# Patient Record
Sex: Female | Born: 1945 | Race: Black or African American | Hispanic: No | Marital: Married | Smoking: Current some day smoker
Health system: Southern US, Community
[De-identification: ages and names within clinical notes are randomized; demographics above are authoritative.]

## PROBLEM LIST (undated history)

## (undated) DIAGNOSIS — Z720 Tobacco use: Secondary | ICD-10-CM

## (undated) DIAGNOSIS — M546 Pain in thoracic spine: Secondary | ICD-10-CM

## (undated) DIAGNOSIS — F0390 Unspecified dementia without behavioral disturbance: Secondary | ICD-10-CM

## (undated) DIAGNOSIS — I5022 Chronic systolic (congestive) heart failure: Secondary | ICD-10-CM

## (undated) DIAGNOSIS — K219 Gastro-esophageal reflux disease without esophagitis: Secondary | ICD-10-CM

## (undated) DIAGNOSIS — K5731 Diverticulosis of large intestine without perforation or abscess with bleeding: Secondary | ICD-10-CM

## (undated) DIAGNOSIS — E785 Hyperlipidemia, unspecified: Secondary | ICD-10-CM

## (undated) DIAGNOSIS — R634 Abnormal weight loss: Secondary | ICD-10-CM

## (undated) DIAGNOSIS — R0602 Shortness of breath: Secondary | ICD-10-CM

## (undated) DIAGNOSIS — I10 Essential (primary) hypertension: Secondary | ICD-10-CM

## (undated) DIAGNOSIS — I2510 Atherosclerotic heart disease of native coronary artery without angina pectoris: Secondary | ICD-10-CM

## (undated) DIAGNOSIS — R609 Edema, unspecified: Secondary | ICD-10-CM

## (undated) DIAGNOSIS — K2970 Gastritis, unspecified, without bleeding: Secondary | ICD-10-CM

## (undated) DIAGNOSIS — G8929 Other chronic pain: Secondary | ICD-10-CM

## (undated) DIAGNOSIS — J4530 Mild persistent asthma, uncomplicated: Secondary | ICD-10-CM

## (undated) DIAGNOSIS — E871 Hypo-osmolality and hyponatremia: Secondary | ICD-10-CM

## (undated) DIAGNOSIS — Z95810 Presence of automatic (implantable) cardiac defibrillator: Secondary | ICD-10-CM

## (undated) DIAGNOSIS — J449 Chronic obstructive pulmonary disease, unspecified: Secondary | ICD-10-CM

## (undated) DIAGNOSIS — E669 Obesity, unspecified: Secondary | ICD-10-CM

## (undated) DIAGNOSIS — J189 Pneumonia, unspecified organism: Secondary | ICD-10-CM

## (undated) DIAGNOSIS — M549 Dorsalgia, unspecified: Secondary | ICD-10-CM

## (undated) DIAGNOSIS — M1990 Unspecified osteoarthritis, unspecified site: Secondary | ICD-10-CM

## (undated) DIAGNOSIS — I255 Ischemic cardiomyopathy: Secondary | ICD-10-CM

## (undated) DIAGNOSIS — F419 Anxiety disorder, unspecified: Secondary | ICD-10-CM

## (undated) DIAGNOSIS — D509 Iron deficiency anemia, unspecified: Secondary | ICD-10-CM

## (undated) DIAGNOSIS — G4733 Obstructive sleep apnea (adult) (pediatric): Secondary | ICD-10-CM

## (undated) DIAGNOSIS — R55 Syncope and collapse: Secondary | ICD-10-CM

## (undated) DIAGNOSIS — IMO0002 Reserved for concepts with insufficient information to code with codable children: Secondary | ICD-10-CM

## (undated) DIAGNOSIS — E119 Type 2 diabetes mellitus without complications: Secondary | ICD-10-CM

## (undated) DIAGNOSIS — I219 Acute myocardial infarction, unspecified: Secondary | ICD-10-CM

## (undated) HISTORY — DX: Acute myocardial infarction, unspecified: I21.9

## (undated) HISTORY — DX: Gastro-esophageal reflux disease without esophagitis: K21.9

## (undated) HISTORY — DX: Ischemic cardiomyopathy: I25.5

## (undated) HISTORY — DX: Essential (primary) hypertension: I10

## (undated) HISTORY — DX: Obesity, unspecified: E66.9

## (undated) HISTORY — PX: OTHER SURGICAL HISTORY: SHX169

## (undated) HISTORY — PX: CARPAL TUNNEL RELEASE: SHX101

## (undated) HISTORY — PX: TOTAL ABDOMINAL HYSTERECTOMY: SHX209

## (undated) HISTORY — DX: Unspecified osteoarthritis, unspecified site: M19.90

## (undated) HISTORY — DX: Diverticulosis of large intestine without perforation or abscess with bleeding: K57.31

## (undated) HISTORY — PX: TONSILLECTOMY: SUR1361

## (undated) HISTORY — DX: Atherosclerotic heart disease of native coronary artery without angina pectoris: I25.10

## (undated) HISTORY — DX: Reserved for concepts with insufficient information to code with codable children: IMO0002

## (undated) HISTORY — DX: Hyperlipidemia, unspecified: E78.5

## (undated) HISTORY — DX: Mild persistent asthma, uncomplicated: J45.30

## (undated) HISTORY — PX: TOTAL ABDOMINAL HYSTERECTOMY W/ BILATERAL SALPINGOOPHORECTOMY: SHX83

## (undated) HISTORY — PX: APPENDECTOMY: SHX54

## (undated) HISTORY — PX: REFRACTIVE SURGERY: SHX103

## (undated) HISTORY — DX: Obstructive sleep apnea (adult) (pediatric): G47.33

## (undated) HISTORY — DX: Anxiety disorder, unspecified: F41.9

---

## 1997-07-01 ENCOUNTER — Encounter: Admission: RE | Admit: 1997-07-01 | Discharge: 1997-07-01 | Payer: Self-pay | Admitting: Internal Medicine

## 1997-07-31 ENCOUNTER — Encounter: Admission: RE | Admit: 1997-07-31 | Discharge: 1997-07-31 | Payer: Self-pay | Admitting: Internal Medicine

## 1997-08-05 ENCOUNTER — Encounter: Admission: RE | Admit: 1997-08-05 | Discharge: 1997-08-05 | Payer: Self-pay | Admitting: Internal Medicine

## 1997-10-01 ENCOUNTER — Encounter: Admission: RE | Admit: 1997-10-01 | Discharge: 1997-10-01 | Payer: Self-pay | Admitting: Internal Medicine

## 1997-11-01 ENCOUNTER — Emergency Department (HOSPITAL_COMMUNITY): Admission: EM | Admit: 1997-11-01 | Discharge: 1997-11-01 | Payer: Self-pay | Admitting: *Deleted

## 1997-11-02 ENCOUNTER — Encounter: Admission: RE | Admit: 1997-11-02 | Discharge: 1997-11-02 | Payer: Self-pay | Admitting: Internal Medicine

## 1997-11-03 ENCOUNTER — Encounter: Payer: Self-pay | Admitting: Hematology and Oncology

## 1997-11-03 ENCOUNTER — Inpatient Hospital Stay (HOSPITAL_COMMUNITY): Admission: RE | Admit: 1997-11-03 | Discharge: 1997-11-05 | Payer: Self-pay | Admitting: Hematology and Oncology

## 1997-11-03 ENCOUNTER — Encounter: Admission: RE | Admit: 1997-11-03 | Discharge: 1997-11-03 | Payer: Self-pay | Admitting: Hematology and Oncology

## 1997-11-19 ENCOUNTER — Encounter: Admission: RE | Admit: 1997-11-19 | Discharge: 1997-11-19 | Payer: Self-pay | Admitting: Internal Medicine

## 1997-12-02 ENCOUNTER — Ambulatory Visit: Admission: RE | Admit: 1997-12-02 | Discharge: 1997-12-02 | Payer: Self-pay | Admitting: Internal Medicine

## 1997-12-16 ENCOUNTER — Encounter: Admission: RE | Admit: 1997-12-16 | Discharge: 1997-12-16 | Payer: Self-pay | Admitting: Internal Medicine

## 1997-12-30 ENCOUNTER — Ambulatory Visit (HOSPITAL_COMMUNITY): Admission: RE | Admit: 1997-12-30 | Discharge: 1997-12-30 | Payer: Self-pay

## 1998-01-08 ENCOUNTER — Encounter: Admission: RE | Admit: 1998-01-08 | Discharge: 1998-01-08 | Payer: Self-pay | Admitting: Internal Medicine

## 1998-01-15 ENCOUNTER — Encounter: Admission: RE | Admit: 1998-01-15 | Discharge: 1998-01-15 | Payer: Self-pay | Admitting: Internal Medicine

## 1998-03-16 ENCOUNTER — Encounter: Admission: RE | Admit: 1998-03-16 | Discharge: 1998-03-16 | Payer: Self-pay | Admitting: Internal Medicine

## 1998-06-15 ENCOUNTER — Encounter: Admission: RE | Admit: 1998-06-15 | Discharge: 1998-06-15 | Payer: Self-pay | Admitting: Internal Medicine

## 1998-07-14 ENCOUNTER — Encounter: Admission: RE | Admit: 1998-07-14 | Discharge: 1998-07-14 | Payer: Self-pay | Admitting: Internal Medicine

## 1998-07-19 ENCOUNTER — Encounter: Payer: Self-pay | Admitting: *Deleted

## 1998-07-19 ENCOUNTER — Emergency Department (HOSPITAL_COMMUNITY): Admission: EM | Admit: 1998-07-19 | Discharge: 1998-07-19 | Payer: Self-pay | Admitting: *Deleted

## 1998-07-29 ENCOUNTER — Encounter: Admission: RE | Admit: 1998-07-29 | Discharge: 1998-07-29 | Payer: Self-pay | Admitting: Internal Medicine

## 1998-09-09 ENCOUNTER — Ambulatory Visit (HOSPITAL_COMMUNITY): Admission: RE | Admit: 1998-09-09 | Discharge: 1998-09-09 | Payer: Self-pay | Admitting: Gastroenterology

## 1998-09-09 ENCOUNTER — Encounter (INDEPENDENT_AMBULATORY_CARE_PROVIDER_SITE_OTHER): Payer: Self-pay | Admitting: Specialist

## 1998-09-14 ENCOUNTER — Encounter: Admission: RE | Admit: 1998-09-14 | Discharge: 1998-09-14 | Payer: Self-pay | Admitting: Internal Medicine

## 1998-09-28 ENCOUNTER — Ambulatory Visit (HOSPITAL_COMMUNITY): Admission: RE | Admit: 1998-09-28 | Discharge: 1998-09-28 | Payer: Self-pay | Admitting: Internal Medicine

## 1998-09-28 ENCOUNTER — Encounter: Payer: Self-pay | Admitting: Internal Medicine

## 1998-11-03 ENCOUNTER — Encounter: Admission: RE | Admit: 1998-11-03 | Discharge: 1998-11-03 | Payer: Self-pay | Admitting: Internal Medicine

## 1999-01-19 ENCOUNTER — Ambulatory Visit: Admission: RE | Admit: 1999-01-19 | Discharge: 1999-01-19 | Payer: Self-pay | Admitting: Internal Medicine

## 1999-01-25 ENCOUNTER — Encounter: Admission: RE | Admit: 1999-01-25 | Discharge: 1999-01-25 | Payer: Self-pay | Admitting: Hematology and Oncology

## 1999-02-09 ENCOUNTER — Encounter: Admission: RE | Admit: 1999-02-09 | Discharge: 1999-02-09 | Payer: Self-pay | Admitting: Internal Medicine

## 1999-03-08 ENCOUNTER — Ambulatory Visit (HOSPITAL_BASED_OUTPATIENT_CLINIC_OR_DEPARTMENT_OTHER): Admission: RE | Admit: 1999-03-08 | Discharge: 1999-03-09 | Payer: Self-pay | Admitting: Otolaryngology

## 1999-03-08 ENCOUNTER — Encounter (INDEPENDENT_AMBULATORY_CARE_PROVIDER_SITE_OTHER): Payer: Self-pay | Admitting: Specialist

## 1999-04-27 ENCOUNTER — Encounter: Admission: RE | Admit: 1999-04-27 | Discharge: 1999-04-27 | Payer: Self-pay | Admitting: Internal Medicine

## 1999-05-03 ENCOUNTER — Encounter: Admission: RE | Admit: 1999-05-03 | Discharge: 1999-05-03 | Payer: Self-pay | Admitting: Internal Medicine

## 1999-08-03 ENCOUNTER — Encounter: Admission: RE | Admit: 1999-08-03 | Discharge: 1999-08-03 | Payer: Self-pay | Admitting: Internal Medicine

## 1999-09-29 ENCOUNTER — Ambulatory Visit (HOSPITAL_COMMUNITY): Admission: RE | Admit: 1999-09-29 | Discharge: 1999-09-29 | Payer: Self-pay | Admitting: Internal Medicine

## 1999-11-01 ENCOUNTER — Encounter: Admission: RE | Admit: 1999-11-01 | Discharge: 1999-11-01 | Payer: Self-pay | Admitting: Internal Medicine

## 1999-11-08 ENCOUNTER — Encounter: Admission: RE | Admit: 1999-11-08 | Discharge: 1999-11-08 | Payer: Self-pay | Admitting: Hematology and Oncology

## 1999-12-16 ENCOUNTER — Encounter: Payer: Self-pay | Admitting: Gastroenterology

## 1999-12-16 ENCOUNTER — Ambulatory Visit (HOSPITAL_COMMUNITY): Admission: RE | Admit: 1999-12-16 | Discharge: 1999-12-16 | Payer: Self-pay | Admitting: Gastroenterology

## 2000-01-03 DIAGNOSIS — I219 Acute myocardial infarction, unspecified: Secondary | ICD-10-CM

## 2000-01-03 HISTORY — DX: Acute myocardial infarction, unspecified: I21.9

## 2000-01-03 HISTORY — PX: CORONARY ANGIOPLASTY WITH STENT PLACEMENT: SHX49

## 2000-01-08 ENCOUNTER — Encounter: Payer: Self-pay | Admitting: Emergency Medicine

## 2000-01-08 ENCOUNTER — Emergency Department (HOSPITAL_COMMUNITY): Admission: EM | Admit: 2000-01-08 | Discharge: 2000-01-08 | Payer: Self-pay | Admitting: Emergency Medicine

## 2000-01-25 ENCOUNTER — Encounter: Admission: RE | Admit: 2000-01-25 | Discharge: 2000-01-25 | Payer: Self-pay | Admitting: Internal Medicine

## 2000-03-06 ENCOUNTER — Encounter: Admission: RE | Admit: 2000-03-06 | Discharge: 2000-03-06 | Payer: Self-pay | Admitting: Internal Medicine

## 2000-04-11 ENCOUNTER — Encounter: Admission: RE | Admit: 2000-04-11 | Discharge: 2000-04-11 | Payer: Self-pay | Admitting: Internal Medicine

## 2000-05-17 ENCOUNTER — Emergency Department (HOSPITAL_COMMUNITY): Admission: EM | Admit: 2000-05-17 | Discharge: 2000-05-17 | Payer: Self-pay | Admitting: Emergency Medicine

## 2000-05-17 ENCOUNTER — Encounter: Payer: Self-pay | Admitting: Emergency Medicine

## 2000-06-07 ENCOUNTER — Encounter: Admission: RE | Admit: 2000-06-07 | Discharge: 2000-06-07 | Payer: Self-pay | Admitting: Internal Medicine

## 2000-07-03 ENCOUNTER — Encounter: Admission: RE | Admit: 2000-07-03 | Discharge: 2000-07-03 | Payer: Self-pay | Admitting: Internal Medicine

## 2000-07-09 ENCOUNTER — Encounter: Payer: Self-pay | Admitting: Emergency Medicine

## 2000-07-09 ENCOUNTER — Inpatient Hospital Stay (HOSPITAL_COMMUNITY): Admission: EM | Admit: 2000-07-09 | Discharge: 2000-07-16 | Payer: Self-pay | Admitting: Emergency Medicine

## 2000-07-09 DIAGNOSIS — I2510 Atherosclerotic heart disease of native coronary artery without angina pectoris: Secondary | ICD-10-CM | POA: Insufficient documentation

## 2000-07-10 ENCOUNTER — Encounter: Payer: Self-pay | Admitting: Cardiology

## 2000-07-11 ENCOUNTER — Encounter: Payer: Self-pay | Admitting: Cardiology

## 2000-07-14 ENCOUNTER — Encounter: Payer: Self-pay | Admitting: Cardiology

## 2000-07-24 ENCOUNTER — Encounter: Admission: RE | Admit: 2000-07-24 | Discharge: 2000-07-24 | Payer: Self-pay | Admitting: Internal Medicine

## 2000-07-31 ENCOUNTER — Encounter (HOSPITAL_COMMUNITY): Admission: RE | Admit: 2000-07-31 | Discharge: 2000-10-29 | Payer: Self-pay | Admitting: Cardiology

## 2000-08-08 ENCOUNTER — Encounter: Admission: RE | Admit: 2000-08-08 | Discharge: 2000-11-06 | Payer: Self-pay | Admitting: Cardiology

## 2000-09-05 ENCOUNTER — Encounter: Admission: RE | Admit: 2000-09-05 | Discharge: 2000-09-05 | Payer: Self-pay | Admitting: Internal Medicine

## 2000-09-06 ENCOUNTER — Encounter: Payer: Self-pay | Admitting: Infectious Diseases

## 2000-09-06 ENCOUNTER — Inpatient Hospital Stay (HOSPITAL_COMMUNITY): Admission: EM | Admit: 2000-09-06 | Discharge: 2000-09-08 | Payer: Self-pay

## 2000-10-25 ENCOUNTER — Ambulatory Visit: Admission: RE | Admit: 2000-10-25 | Discharge: 2000-10-25 | Payer: Self-pay | Admitting: Cardiology

## 2000-10-29 ENCOUNTER — Ambulatory Visit (HOSPITAL_COMMUNITY): Admission: RE | Admit: 2000-10-29 | Discharge: 2000-10-29 | Payer: Self-pay | Admitting: Internal Medicine

## 2000-11-09 ENCOUNTER — Inpatient Hospital Stay (HOSPITAL_COMMUNITY): Admission: EM | Admit: 2000-11-09 | Discharge: 2000-11-13 | Payer: Self-pay | Admitting: Emergency Medicine

## 2000-12-05 ENCOUNTER — Encounter: Admission: RE | Admit: 2000-12-05 | Discharge: 2000-12-05 | Payer: Self-pay | Admitting: Internal Medicine

## 2001-02-27 ENCOUNTER — Encounter: Admission: RE | Admit: 2001-02-27 | Discharge: 2001-02-27 | Payer: Self-pay | Admitting: Internal Medicine

## 2001-03-17 ENCOUNTER — Inpatient Hospital Stay (HOSPITAL_COMMUNITY): Admission: EM | Admit: 2001-03-17 | Discharge: 2001-03-20 | Payer: Self-pay | Admitting: Emergency Medicine

## 2001-03-17 DIAGNOSIS — K5731 Diverticulosis of large intestine without perforation or abscess with bleeding: Secondary | ICD-10-CM

## 2001-05-14 ENCOUNTER — Ambulatory Visit (HOSPITAL_COMMUNITY): Admission: RE | Admit: 2001-05-14 | Discharge: 2001-05-14 | Payer: Self-pay | Admitting: Gastroenterology

## 2001-05-14 ENCOUNTER — Encounter: Payer: Self-pay | Admitting: Gastroenterology

## 2001-06-05 ENCOUNTER — Other Ambulatory Visit: Admission: RE | Admit: 2001-06-05 | Discharge: 2001-06-05 | Payer: Self-pay | Admitting: Obstetrics and Gynecology

## 2001-06-26 ENCOUNTER — Encounter: Admission: RE | Admit: 2001-06-26 | Discharge: 2001-06-26 | Payer: Self-pay | Admitting: Internal Medicine

## 2001-08-29 ENCOUNTER — Inpatient Hospital Stay (HOSPITAL_COMMUNITY): Admission: EM | Admit: 2001-08-29 | Discharge: 2001-08-30 | Payer: Self-pay

## 2001-09-03 ENCOUNTER — Encounter: Admission: RE | Admit: 2001-09-03 | Discharge: 2001-09-03 | Payer: Self-pay | Admitting: Internal Medicine

## 2001-09-06 ENCOUNTER — Ambulatory Visit (HOSPITAL_COMMUNITY): Admission: RE | Admit: 2001-09-06 | Discharge: 2001-09-06 | Payer: Self-pay | Admitting: Cardiology

## 2001-09-26 ENCOUNTER — Encounter: Payer: Self-pay | Admitting: Obstetrics and Gynecology

## 2001-09-26 ENCOUNTER — Ambulatory Visit (HOSPITAL_COMMUNITY): Admission: RE | Admit: 2001-09-26 | Discharge: 2001-09-26 | Payer: Self-pay | Admitting: Obstetrics and Gynecology

## 2001-10-31 ENCOUNTER — Encounter: Payer: Self-pay | Admitting: Internal Medicine

## 2001-10-31 ENCOUNTER — Ambulatory Visit (HOSPITAL_COMMUNITY): Admission: RE | Admit: 2001-10-31 | Discharge: 2001-10-31 | Payer: Self-pay | Admitting: Internal Medicine

## 2001-12-04 ENCOUNTER — Encounter: Admission: RE | Admit: 2001-12-04 | Discharge: 2001-12-04 | Payer: Self-pay | Admitting: Internal Medicine

## 2001-12-06 ENCOUNTER — Encounter: Payer: Self-pay | Admitting: Emergency Medicine

## 2001-12-06 ENCOUNTER — Emergency Department (HOSPITAL_COMMUNITY): Admission: EM | Admit: 2001-12-06 | Discharge: 2001-12-06 | Payer: Self-pay | Admitting: Emergency Medicine

## 2001-12-10 ENCOUNTER — Encounter: Admission: RE | Admit: 2001-12-10 | Discharge: 2001-12-10 | Payer: Self-pay | Admitting: Internal Medicine

## 2002-01-02 HISTORY — PX: CHOLECYSTECTOMY: SHX55

## 2002-01-08 ENCOUNTER — Encounter: Admission: RE | Admit: 2002-01-08 | Discharge: 2002-01-08 | Payer: Self-pay | Admitting: Internal Medicine

## 2002-01-22 ENCOUNTER — Encounter: Admission: RE | Admit: 2002-01-22 | Discharge: 2002-01-22 | Payer: Self-pay | Admitting: Internal Medicine

## 2002-04-01 ENCOUNTER — Encounter: Admission: RE | Admit: 2002-04-01 | Discharge: 2002-04-01 | Payer: Self-pay | Admitting: Internal Medicine

## 2002-04-17 ENCOUNTER — Emergency Department (HOSPITAL_COMMUNITY): Admission: EM | Admit: 2002-04-17 | Discharge: 2002-04-17 | Payer: Self-pay | Admitting: Emergency Medicine

## 2002-04-17 ENCOUNTER — Encounter: Payer: Self-pay | Admitting: Emergency Medicine

## 2002-06-11 ENCOUNTER — Encounter: Admission: RE | Admit: 2002-06-11 | Discharge: 2002-06-11 | Payer: Self-pay | Admitting: Internal Medicine

## 2002-08-27 ENCOUNTER — Encounter: Admission: RE | Admit: 2002-08-27 | Discharge: 2002-08-27 | Payer: Self-pay | Admitting: Internal Medicine

## 2002-09-23 ENCOUNTER — Encounter: Admission: RE | Admit: 2002-09-23 | Discharge: 2002-09-23 | Payer: Self-pay | Admitting: Internal Medicine

## 2002-11-19 ENCOUNTER — Inpatient Hospital Stay (HOSPITAL_COMMUNITY): Admission: AD | Admit: 2002-11-19 | Discharge: 2002-11-22 | Payer: Self-pay | Admitting: Internal Medicine

## 2002-11-19 ENCOUNTER — Encounter: Admission: RE | Admit: 2002-11-19 | Discharge: 2002-11-19 | Payer: Self-pay | Admitting: Internal Medicine

## 2002-12-02 ENCOUNTER — Encounter: Admission: RE | Admit: 2002-12-02 | Discharge: 2002-12-02 | Payer: Self-pay | Admitting: Internal Medicine

## 2003-03-24 ENCOUNTER — Encounter: Admission: RE | Admit: 2003-03-24 | Discharge: 2003-03-24 | Payer: Self-pay | Admitting: Internal Medicine

## 2003-04-01 ENCOUNTER — Ambulatory Visit (HOSPITAL_COMMUNITY): Admission: RE | Admit: 2003-04-01 | Discharge: 2003-04-01 | Payer: Self-pay | Admitting: Internal Medicine

## 2003-06-23 ENCOUNTER — Encounter: Admission: RE | Admit: 2003-06-23 | Discharge: 2003-06-23 | Payer: Self-pay | Admitting: Internal Medicine

## 2003-08-16 ENCOUNTER — Emergency Department (HOSPITAL_COMMUNITY): Admission: EM | Admit: 2003-08-16 | Discharge: 2003-08-16 | Payer: Self-pay | Admitting: Emergency Medicine

## 2003-10-13 ENCOUNTER — Emergency Department (HOSPITAL_COMMUNITY): Admission: EM | Admit: 2003-10-13 | Discharge: 2003-10-13 | Payer: Self-pay | Admitting: *Deleted

## 2003-10-19 ENCOUNTER — Ambulatory Visit: Payer: Self-pay

## 2003-11-12 ENCOUNTER — Ambulatory Visit: Payer: Self-pay | Admitting: Internal Medicine

## 2003-12-07 ENCOUNTER — Inpatient Hospital Stay (HOSPITAL_COMMUNITY): Admission: EM | Admit: 2003-12-07 | Discharge: 2003-12-09 | Payer: Self-pay | Admitting: Emergency Medicine

## 2003-12-07 ENCOUNTER — Ambulatory Visit: Payer: Self-pay | Admitting: Cardiology

## 2003-12-07 ENCOUNTER — Ambulatory Visit: Payer: Self-pay | Admitting: Internal Medicine

## 2004-02-01 ENCOUNTER — Ambulatory Visit: Payer: Self-pay | Admitting: Internal Medicine

## 2004-02-08 ENCOUNTER — Ambulatory Visit: Payer: Self-pay | Admitting: Internal Medicine

## 2004-02-15 ENCOUNTER — Ambulatory Visit: Payer: Self-pay | Admitting: Internal Medicine

## 2004-03-11 ENCOUNTER — Ambulatory Visit: Payer: Self-pay | Admitting: Cardiology

## 2004-04-07 ENCOUNTER — Ambulatory Visit: Payer: Self-pay

## 2004-04-25 ENCOUNTER — Ambulatory Visit: Payer: Self-pay | Admitting: Internal Medicine

## 2004-07-18 ENCOUNTER — Ambulatory Visit: Payer: Self-pay | Admitting: Internal Medicine

## 2004-09-19 ENCOUNTER — Ambulatory Visit: Payer: Self-pay | Admitting: Internal Medicine

## 2004-09-22 ENCOUNTER — Ambulatory Visit: Payer: Self-pay | Admitting: Internal Medicine

## 2004-09-29 ENCOUNTER — Ambulatory Visit: Payer: Self-pay | Admitting: Cardiology

## 2004-10-12 ENCOUNTER — Ambulatory Visit: Payer: Self-pay | Admitting: Gastroenterology

## 2004-11-27 ENCOUNTER — Inpatient Hospital Stay (HOSPITAL_COMMUNITY): Admission: EM | Admit: 2004-11-27 | Discharge: 2004-12-02 | Payer: Self-pay | Admitting: Emergency Medicine

## 2004-11-28 ENCOUNTER — Ambulatory Visit: Payer: Self-pay | Admitting: Internal Medicine

## 2004-11-29 ENCOUNTER — Ambulatory Visit: Payer: Self-pay | Admitting: Gastroenterology

## 2004-11-30 ENCOUNTER — Encounter (INDEPENDENT_AMBULATORY_CARE_PROVIDER_SITE_OTHER): Payer: Self-pay | Admitting: Specialist

## 2004-12-01 ENCOUNTER — Encounter (INDEPENDENT_AMBULATORY_CARE_PROVIDER_SITE_OTHER): Payer: Self-pay | Admitting: Specialist

## 2004-12-16 ENCOUNTER — Ambulatory Visit (HOSPITAL_COMMUNITY): Admission: RE | Admit: 2004-12-16 | Discharge: 2004-12-16 | Payer: Self-pay | Admitting: Cardiology

## 2004-12-16 ENCOUNTER — Ambulatory Visit: Payer: Self-pay | Admitting: *Deleted

## 2005-01-05 ENCOUNTER — Ambulatory Visit: Payer: Self-pay | Admitting: Cardiology

## 2005-03-20 ENCOUNTER — Ambulatory Visit: Payer: Self-pay | Admitting: Internal Medicine

## 2005-03-28 ENCOUNTER — Ambulatory Visit: Payer: Self-pay | Admitting: Cardiology

## 2005-04-14 ENCOUNTER — Encounter: Payer: Self-pay | Admitting: Internal Medicine

## 2005-04-20 ENCOUNTER — Ambulatory Visit (HOSPITAL_COMMUNITY): Admission: RE | Admit: 2005-04-20 | Discharge: 2005-04-20 | Payer: Self-pay | Admitting: Internal Medicine

## 2005-06-03 ENCOUNTER — Ambulatory Visit: Payer: Self-pay | Admitting: Internal Medicine

## 2005-06-03 ENCOUNTER — Inpatient Hospital Stay (HOSPITAL_COMMUNITY): Admission: EM | Admit: 2005-06-03 | Discharge: 2005-06-06 | Payer: Self-pay | Admitting: Emergency Medicine

## 2005-06-14 ENCOUNTER — Ambulatory Visit: Payer: Self-pay | Admitting: Internal Medicine

## 2005-06-15 ENCOUNTER — Ambulatory Visit: Payer: Self-pay | Admitting: Cardiology

## 2005-06-22 ENCOUNTER — Ambulatory Visit: Payer: Self-pay

## 2005-06-30 ENCOUNTER — Ambulatory Visit: Payer: Self-pay | Admitting: Pulmonary Disease

## 2005-07-31 ENCOUNTER — Ambulatory Visit: Payer: Self-pay | Admitting: Internal Medicine

## 2005-08-04 ENCOUNTER — Emergency Department (HOSPITAL_COMMUNITY): Admission: EM | Admit: 2005-08-04 | Discharge: 2005-08-05 | Payer: Self-pay | Admitting: Emergency Medicine

## 2005-08-14 ENCOUNTER — Ambulatory Visit: Payer: Self-pay | Admitting: Pulmonary Disease

## 2005-09-11 ENCOUNTER — Ambulatory Visit: Payer: Self-pay | Admitting: Internal Medicine

## 2005-09-27 ENCOUNTER — Ambulatory Visit: Payer: Self-pay | Admitting: Cardiology

## 2005-10-02 ENCOUNTER — Ambulatory Visit: Payer: Self-pay | Admitting: Cardiology

## 2005-10-16 ENCOUNTER — Encounter: Payer: Self-pay | Admitting: Internal Medicine

## 2005-10-16 ENCOUNTER — Ambulatory Visit: Payer: Self-pay | Admitting: Internal Medicine

## 2005-11-07 DIAGNOSIS — I10 Essential (primary) hypertension: Secondary | ICD-10-CM | POA: Insufficient documentation

## 2005-11-07 DIAGNOSIS — J449 Chronic obstructive pulmonary disease, unspecified: Secondary | ICD-10-CM

## 2005-11-07 DIAGNOSIS — J4530 Mild persistent asthma, uncomplicated: Secondary | ICD-10-CM

## 2005-11-07 DIAGNOSIS — K219 Gastro-esophageal reflux disease without esophagitis: Secondary | ICD-10-CM

## 2005-11-07 HISTORY — DX: Mild persistent asthma, uncomplicated: J45.30

## 2005-11-13 ENCOUNTER — Ambulatory Visit: Payer: Self-pay | Admitting: Internal Medicine

## 2005-11-13 LAB — CONVERTED CEMR LAB
HCT: 38.8 % (ref 34.4–43.3)
MCHC: 31.7 g/dL — ABNORMAL LOW (ref 33.1–35.4)
RDW: 15.6 % — ABNORMAL HIGH (ref 11.5–15.3)
WBC: 7.7 10*3/uL (ref 3.7–10.0)

## 2005-12-11 ENCOUNTER — Ambulatory Visit: Payer: Self-pay | Admitting: Internal Medicine

## 2005-12-11 DIAGNOSIS — I509 Heart failure, unspecified: Secondary | ICD-10-CM | POA: Insufficient documentation

## 2005-12-11 LAB — CONVERTED CEMR LAB
BUN: 11 mg/dL (ref 6–23)
Calcium: 9.1 mg/dL (ref 8.4–10.5)
Chloride: 99 meq/L (ref 96–112)
Creatinine, Ser: 0.94 mg/dL (ref 0.40–1.20)
Glucose, Bld: 141 mg/dL — ABNORMAL HIGH (ref 70–99)

## 2005-12-15 DIAGNOSIS — F1011 Alcohol abuse, in remission: Secondary | ICD-10-CM

## 2006-02-21 ENCOUNTER — Ambulatory Visit: Payer: Self-pay | Admitting: Internal Medicine

## 2006-02-21 ENCOUNTER — Encounter (INDEPENDENT_AMBULATORY_CARE_PROVIDER_SITE_OTHER): Payer: Self-pay | Admitting: Unknown Physician Specialty

## 2006-02-21 ENCOUNTER — Telehealth: Payer: Self-pay | Admitting: *Deleted

## 2006-02-21 DIAGNOSIS — E1129 Type 2 diabetes mellitus with other diabetic kidney complication: Secondary | ICD-10-CM

## 2006-02-21 DIAGNOSIS — E1165 Type 2 diabetes mellitus with hyperglycemia: Secondary | ICD-10-CM

## 2006-03-06 ENCOUNTER — Telehealth (INDEPENDENT_AMBULATORY_CARE_PROVIDER_SITE_OTHER): Payer: Self-pay | Admitting: *Deleted

## 2006-03-07 ENCOUNTER — Encounter (INDEPENDENT_AMBULATORY_CARE_PROVIDER_SITE_OTHER): Payer: Self-pay | Admitting: *Deleted

## 2006-03-07 ENCOUNTER — Ambulatory Visit: Payer: Self-pay | Admitting: *Deleted

## 2006-03-07 DIAGNOSIS — IMO0002 Reserved for concepts with insufficient information to code with codable children: Secondary | ICD-10-CM | POA: Insufficient documentation

## 2006-03-08 ENCOUNTER — Encounter: Payer: Self-pay | Admitting: Internal Medicine

## 2006-03-08 DIAGNOSIS — IMO0002 Reserved for concepts with insufficient information to code with codable children: Secondary | ICD-10-CM

## 2006-03-08 LAB — CONVERTED CEMR LAB
BUN: 7 mg/dL (ref 6–23)
Calcium: 9.3 mg/dL (ref 8.4–10.5)
Chloride: 101 meq/L (ref 96–112)
Potassium: 4.4 meq/L (ref 3.5–5.3)
Sodium: 142 meq/L (ref 135–145)

## 2006-03-10 ENCOUNTER — Ambulatory Visit (HOSPITAL_COMMUNITY): Admission: RE | Admit: 2006-03-10 | Discharge: 2006-03-10 | Payer: Self-pay | Admitting: *Deleted

## 2006-03-20 ENCOUNTER — Ambulatory Visit: Payer: Self-pay | Admitting: Pulmonary Disease

## 2006-03-26 ENCOUNTER — Ambulatory Visit: Payer: Self-pay | Admitting: Internal Medicine

## 2006-03-26 LAB — CONVERTED CEMR LAB: Blood Glucose, Fingerstick: 278

## 2006-03-27 ENCOUNTER — Ambulatory Visit: Payer: Self-pay | Admitting: Pulmonary Disease

## 2006-03-27 ENCOUNTER — Ambulatory Visit (HOSPITAL_BASED_OUTPATIENT_CLINIC_OR_DEPARTMENT_OTHER): Admission: RE | Admit: 2006-03-27 | Discharge: 2006-03-27 | Payer: Self-pay | Admitting: Pulmonary Disease

## 2006-04-11 ENCOUNTER — Encounter: Payer: Self-pay | Admitting: Internal Medicine

## 2006-04-11 ENCOUNTER — Ambulatory Visit: Payer: Self-pay | Admitting: Pulmonary Disease

## 2006-04-16 ENCOUNTER — Ambulatory Visit: Payer: Self-pay | Admitting: Gastroenterology

## 2006-04-23 ENCOUNTER — Ambulatory Visit (HOSPITAL_COMMUNITY): Admission: RE | Admit: 2006-04-23 | Discharge: 2006-04-23 | Payer: Self-pay | Admitting: Obstetrics and Gynecology

## 2006-04-25 DIAGNOSIS — E669 Obesity, unspecified: Secondary | ICD-10-CM

## 2006-05-25 ENCOUNTER — Ambulatory Visit: Payer: Self-pay | Admitting: Pulmonary Disease

## 2006-05-31 ENCOUNTER — Ambulatory Visit: Payer: Self-pay | Admitting: Internal Medicine

## 2006-05-31 LAB — CONVERTED CEMR LAB
Blood Glucose, Fingerstick: 262
Creatinine, Urine: 9.2 mg/dL
Microalb Creat Ratio: 167.9 mg/g — ABNORMAL HIGH (ref 0.0–30.0)
Microalb, Ur: 1.54 mg/dL (ref 0.00–1.89)

## 2006-06-05 ENCOUNTER — Ambulatory Visit: Payer: Self-pay | Admitting: Gastroenterology

## 2006-06-29 ENCOUNTER — Telehealth (INDEPENDENT_AMBULATORY_CARE_PROVIDER_SITE_OTHER): Payer: Self-pay | Admitting: *Deleted

## 2006-07-09 ENCOUNTER — Encounter: Payer: Self-pay | Admitting: Internal Medicine

## 2006-07-09 LAB — HM DIABETES EYE EXAM

## 2006-08-15 ENCOUNTER — Ambulatory Visit: Payer: Self-pay | Admitting: Gastroenterology

## 2006-08-15 ENCOUNTER — Encounter: Payer: Self-pay | Admitting: Internal Medicine

## 2006-09-10 ENCOUNTER — Ambulatory Visit: Payer: Self-pay | Admitting: Cardiology

## 2006-09-14 ENCOUNTER — Encounter: Payer: Self-pay | Admitting: Internal Medicine

## 2006-09-14 ENCOUNTER — Ambulatory Visit: Payer: Self-pay | Admitting: Gastroenterology

## 2006-09-24 ENCOUNTER — Ambulatory Visit: Payer: Self-pay | Admitting: Internal Medicine

## 2006-09-24 LAB — CONVERTED CEMR LAB
AST: 14 units/L (ref 0–37)
Albumin: 4 g/dL (ref 3.5–5.2)
Calcium: 9 mg/dL (ref 8.4–10.5)
Chloride: 102 meq/L (ref 96–112)
Glucose, Bld: 98 mg/dL (ref 70–99)
HDL: 41 mg/dL (ref >39)
Hgb A1c MFr Bld: 9.8 %
Total Bilirubin: 0.3 mg/dL (ref 0.3–1.2)
Total Protein: 6.7 g/dL (ref 6.0–8.3)
Triglycerides: 113 mg/dL (ref <150)
VLDL: 23 mg/dL (ref 0–40)

## 2006-10-11 ENCOUNTER — Telehealth: Payer: Self-pay | Admitting: Internal Medicine

## 2006-10-18 ENCOUNTER — Encounter: Payer: Self-pay | Admitting: Internal Medicine

## 2006-10-18 ENCOUNTER — Ambulatory Visit: Payer: Self-pay | Admitting: Hospitalist

## 2006-10-24 ENCOUNTER — Telehealth: Payer: Self-pay | Admitting: Infectious Diseases

## 2006-12-04 ENCOUNTER — Telehealth: Payer: Self-pay | Admitting: *Deleted

## 2006-12-07 ENCOUNTER — Ambulatory Visit: Payer: Self-pay | Admitting: Gastroenterology

## 2006-12-11 ENCOUNTER — Encounter: Payer: Self-pay | Admitting: Internal Medicine

## 2006-12-13 ENCOUNTER — Ambulatory Visit: Payer: Self-pay | Admitting: Internal Medicine

## 2006-12-20 ENCOUNTER — Ambulatory Visit: Payer: Self-pay | Admitting: Internal Medicine

## 2006-12-20 LAB — CONVERTED CEMR LAB

## 2007-01-02 ENCOUNTER — Telehealth (INDEPENDENT_AMBULATORY_CARE_PROVIDER_SITE_OTHER): Payer: Self-pay | Admitting: *Deleted

## 2007-01-16 ENCOUNTER — Telehealth (INDEPENDENT_AMBULATORY_CARE_PROVIDER_SITE_OTHER): Payer: Self-pay | Admitting: *Deleted

## 2007-01-16 ENCOUNTER — Ambulatory Visit: Payer: Self-pay | Admitting: Pulmonary Disease

## 2007-02-05 ENCOUNTER — Telehealth (INDEPENDENT_AMBULATORY_CARE_PROVIDER_SITE_OTHER): Payer: Self-pay | Admitting: *Deleted

## 2007-02-21 ENCOUNTER — Telehealth: Payer: Self-pay | Admitting: *Deleted

## 2007-02-22 ENCOUNTER — Inpatient Hospital Stay (HOSPITAL_COMMUNITY): Admission: EM | Admit: 2007-02-22 | Discharge: 2007-02-25 | Payer: Self-pay | Admitting: Emergency Medicine

## 2007-02-22 ENCOUNTER — Ambulatory Visit: Payer: Self-pay | Admitting: Infectious Diseases

## 2007-02-27 ENCOUNTER — Ambulatory Visit: Payer: Self-pay | Admitting: Internal Medicine

## 2007-03-15 ENCOUNTER — Ambulatory Visit: Payer: Self-pay | Admitting: *Deleted

## 2007-03-15 ENCOUNTER — Ambulatory Visit: Payer: Self-pay | Admitting: Internal Medicine

## 2007-03-18 LAB — CONVERTED CEMR LAB
Chloride: 103 meq/L (ref 96–112)
Creatinine, Urine: 27.8 mg/dL
Glucose, Bld: 102 mg/dL — ABNORMAL HIGH (ref 70–99)
Hemoglobin: 10.4 g/dL — ABNORMAL LOW (ref 12.0–15.0)
Microalb Creat Ratio: 88.1 mg/g — ABNORMAL HIGH (ref 0.0–30.0)
Microalb, Ur: 2.45 mg/dL — ABNORMAL HIGH (ref 0.00–1.89)
RBC: 3.7 M/uL — ABNORMAL LOW (ref 3.87–5.11)
Sodium: 141 meq/L (ref 135–145)
WBC: 8.6 10*3/uL (ref 4.0–10.5)

## 2007-03-22 ENCOUNTER — Ambulatory Visit: Payer: Self-pay | Admitting: Gastroenterology

## 2007-03-22 LAB — CONVERTED CEMR LAB
Basophils Absolute: 0.1 10*3/uL (ref 0.0–0.1)
Basophils Relative: 0.8 % (ref 0.0–1.0)
Eosinophils Absolute: 0.3 10*3/uL (ref 0.0–0.6)
Eosinophils Relative: 3.9 % (ref 0.0–5.0)
HCT: 32.6 % — ABNORMAL LOW (ref 36.0–46.0)
Hemoglobin: 10.3 g/dL — ABNORMAL LOW (ref 12.0–15.0)
MCHC: 31.7 g/dL (ref 30.0–36.0)
MCV: 87.7 fL (ref 78.0–100.0)
Monocytes Absolute: 0.4 10*3/uL (ref 0.2–0.7)
Monocytes Relative: 6.1 % (ref 3.0–11.0)
Platelets: 369 10*3/uL (ref 150–400)

## 2007-03-26 ENCOUNTER — Ambulatory Visit (HOSPITAL_COMMUNITY): Admission: RE | Admit: 2007-03-26 | Discharge: 2007-03-26 | Payer: Self-pay | Admitting: Gastroenterology

## 2007-04-02 ENCOUNTER — Ambulatory Visit: Payer: Self-pay | Admitting: Internal Medicine

## 2007-04-24 ENCOUNTER — Ambulatory Visit: Payer: Self-pay | Admitting: Gastroenterology

## 2007-04-25 ENCOUNTER — Ambulatory Visit (HOSPITAL_COMMUNITY): Admission: RE | Admit: 2007-04-25 | Discharge: 2007-04-25 | Payer: Self-pay | Admitting: Internal Medicine

## 2007-06-03 ENCOUNTER — Ambulatory Visit: Payer: Self-pay | Admitting: Cardiology

## 2007-08-02 ENCOUNTER — Encounter: Payer: Self-pay | Admitting: Internal Medicine

## 2007-08-23 ENCOUNTER — Ambulatory Visit (HOSPITAL_COMMUNITY): Admission: RE | Admit: 2007-08-23 | Discharge: 2007-08-24 | Payer: Self-pay | Admitting: General Surgery

## 2007-10-29 ENCOUNTER — Telehealth (INDEPENDENT_AMBULATORY_CARE_PROVIDER_SITE_OTHER): Payer: Self-pay | Admitting: *Deleted

## 2008-04-23 DIAGNOSIS — E785 Hyperlipidemia, unspecified: Secondary | ICD-10-CM | POA: Insufficient documentation

## 2008-04-28 ENCOUNTER — Encounter: Payer: Self-pay | Admitting: Internal Medicine

## 2008-04-28 ENCOUNTER — Ambulatory Visit (HOSPITAL_COMMUNITY): Admission: RE | Admit: 2008-04-28 | Discharge: 2008-04-28 | Payer: Self-pay | Admitting: Internal Medicine

## 2008-05-05 ENCOUNTER — Ambulatory Visit: Payer: Self-pay | Admitting: Cardiology

## 2008-05-05 DIAGNOSIS — R000 Tachycardia, unspecified: Secondary | ICD-10-CM

## 2008-05-05 DIAGNOSIS — R079 Chest pain, unspecified: Secondary | ICD-10-CM | POA: Insufficient documentation

## 2008-05-06 ENCOUNTER — Telehealth (INDEPENDENT_AMBULATORY_CARE_PROVIDER_SITE_OTHER): Payer: Self-pay | Admitting: *Deleted

## 2008-05-07 ENCOUNTER — Ambulatory Visit: Payer: Self-pay

## 2008-05-11 ENCOUNTER — Encounter: Payer: Self-pay | Admitting: Cardiology

## 2008-05-14 ENCOUNTER — Telehealth: Payer: Self-pay | Admitting: Cardiology

## 2008-05-15 ENCOUNTER — Ambulatory Visit: Payer: Self-pay | Admitting: Cardiology

## 2008-05-15 LAB — CONVERTED CEMR LAB
Basophils Relative: 0.2 % (ref 0.0–3.0)
Chloride: 103 meq/L (ref 96–112)
Creatinine, Ser: 1 mg/dL (ref 0.4–1.2)
Eosinophils Absolute: 0.2 10*3/uL (ref 0.0–0.7)
Glucose, Bld: 146 mg/dL — ABNORMAL HIGH (ref 70–99)
HCT: 33.6 % — ABNORMAL LOW (ref 36.0–46.0)
MCV: 85.8 fL (ref 78.0–100.0)
Monocytes Absolute: 0.5 10*3/uL (ref 0.1–1.0)
Neutro Abs: 3.3 10*3/uL (ref 1.4–7.7)
Platelets: 280 10*3/uL (ref 150.0–400.0)
Prothrombin Time: 11.2 s (ref 10.9–13.3)
RBC: 3.92 M/uL (ref 3.87–5.11)
Sodium: 140 meq/L (ref 135–145)
WBC: 6.7 10*3/uL (ref 4.5–10.5)
aPTT: 31.1 s — ABNORMAL HIGH (ref 21.7–28.8)

## 2008-05-18 ENCOUNTER — Telehealth: Payer: Self-pay | Admitting: Cardiology

## 2008-05-19 ENCOUNTER — Inpatient Hospital Stay (HOSPITAL_BASED_OUTPATIENT_CLINIC_OR_DEPARTMENT_OTHER): Admission: RE | Admit: 2008-05-19 | Discharge: 2008-05-19 | Payer: Self-pay | Admitting: Cardiology

## 2008-05-19 ENCOUNTER — Ambulatory Visit: Payer: Self-pay | Admitting: Cardiology

## 2008-06-08 ENCOUNTER — Ambulatory Visit: Payer: Self-pay | Admitting: Cardiology

## 2008-09-18 ENCOUNTER — Encounter (INDEPENDENT_AMBULATORY_CARE_PROVIDER_SITE_OTHER): Payer: Self-pay | Admitting: *Deleted

## 2008-12-15 ENCOUNTER — Ambulatory Visit: Payer: Self-pay | Admitting: Cardiology

## 2008-12-15 ENCOUNTER — Telehealth: Payer: Self-pay | Admitting: Cardiology

## 2009-01-02 LAB — HM MAMMOGRAPHY: HM Mammogram: NORMAL

## 2009-01-25 ENCOUNTER — Ambulatory Visit: Payer: Self-pay | Admitting: Cardiology

## 2009-05-21 ENCOUNTER — Encounter: Admission: RE | Admit: 2009-05-21 | Discharge: 2009-05-21 | Payer: Self-pay | Admitting: Internal Medicine

## 2009-06-07 ENCOUNTER — Ambulatory Visit: Payer: Medicare Other | Admitting: Family Medicine

## 2009-06-07 ENCOUNTER — Encounter: Payer: Self-pay | Admitting: Cardiology

## 2009-06-16 ENCOUNTER — Ambulatory Visit: Payer: Self-pay | Admitting: Cardiology

## 2009-06-16 DIAGNOSIS — R9431 Abnormal electrocardiogram [ECG] [EKG]: Secondary | ICD-10-CM

## 2009-06-22 ENCOUNTER — Telehealth (INDEPENDENT_AMBULATORY_CARE_PROVIDER_SITE_OTHER): Payer: Self-pay | Admitting: *Deleted

## 2009-08-16 ENCOUNTER — Encounter: Payer: Self-pay | Admitting: Cardiology

## 2009-08-27 ENCOUNTER — Ambulatory Visit: Payer: Self-pay | Admitting: Cardiology

## 2009-09-27 ENCOUNTER — Encounter: Payer: Self-pay | Admitting: Cardiology

## 2009-09-27 ENCOUNTER — Encounter: Payer: Self-pay | Admitting: Endocrinology

## 2009-09-27 LAB — CONVERTED CEMR LAB: Hgb A1c MFr Bld: 8 %

## 2009-10-20 ENCOUNTER — Encounter: Payer: Self-pay | Admitting: Endocrinology

## 2009-10-20 LAB — CONVERTED CEMR LAB
Basophils Relative: 0.01 %
Eosinophils Relative: 0.23 %
HCT: 0.37 %
Hemoglobin: 12 g/dL
Lymphocytes, automated: 2.4 %
MCV: 84 fL
Monocytes Relative: 0.4 %
WBC: 6.2 10*3/uL

## 2009-12-07 ENCOUNTER — Encounter: Payer: Self-pay | Admitting: Pulmonary Disease

## 2010-01-07 ENCOUNTER — Encounter: Payer: Self-pay | Admitting: Endocrinology

## 2010-01-07 LAB — CONVERTED CEMR LAB
Cholesterol: 139 mg/dL
Hgb A1c MFr Bld: 9.1 %
LDL Cholesterol: 79 mg/dL
Triglyceride fasting, serum: 106 mg/dL

## 2010-01-10 ENCOUNTER — Ambulatory Visit
Admission: RE | Admit: 2010-01-10 | Discharge: 2010-01-10 | Payer: Self-pay | Source: Home / Self Care | Attending: Pulmonary Disease | Admitting: Pulmonary Disease

## 2010-01-10 ENCOUNTER — Encounter: Payer: Self-pay | Admitting: Pulmonary Disease

## 2010-01-10 DIAGNOSIS — G4733 Obstructive sleep apnea (adult) (pediatric): Secondary | ICD-10-CM | POA: Insufficient documentation

## 2010-01-19 ENCOUNTER — Encounter: Payer: Self-pay | Admitting: Pulmonary Disease

## 2010-01-23 ENCOUNTER — Encounter: Payer: Self-pay | Admitting: Internal Medicine

## 2010-01-31 ENCOUNTER — Encounter: Payer: Self-pay | Admitting: Pulmonary Disease

## 2010-02-01 NOTE — Assessment & Plan Note (Signed)
Summary: 2 month rov   Visit Type:  Follow-up Primary Provider:  Dr.Avbuere  CC:  HTN and Cardiomyopathy.  History of Present Illness: The patient presents for evaluation of difficult to control hypertension. At the last visit I increased her carvedilol. She did well with this. She was quite fatigued but has been improved and she was found to have low vitamin D and iron and these have been supplemented. She denies any new symptoms such as shortness of breath, PND or orthopnea. She is having no palpitations, presyncope or syncope. She is having no chest pressure, neck or arm discomfort. She is not exercising as much as I would like. She continues to abstain from cigarettes.  Current Medications (verified): 1)  Metformin Hcl 500 Mg Tabs (Metformin Hcl) .... Take One Tablet By Mouth Two Times A Day 2)  Novolog Flexpen 100 Unit/ml Soln (Insulin Aspart) .... Take 7-10 Units Three Times Daily Before Meals As Directed 3)  Altace 10 Mg Caps (Ramipril) .... Take One Tablet By Mouth Once Daily 4)  Caduet 10-40 Mg Tabs (Amlodipine-Atorvastatin) .[name].. 1 By Mouth Daily 5)  Furosemide 80 Mg Tabs (Furosemide) .... Take 1 Tablet By Mouth Once A Day 6)  Aspirin 81 Mg Tbec (Aspirin) .... Take One Tablet By Mouth Once Daily 7)  Protonix 40 Mg Tbec (Pantoprazole Sodium) .... Take One Tablet By Mouth Once Daily 8)  Effexor Xr 150 Mg Cp24 (Venlafaxine Hcl) .... Take 2 Capsules By Mouth Once A Day 9)  Lantus For Opticlik 100 Unit/ml  Soln (Insulin Glargine) .... Inject 44 Units Subcutaneously Once Daily 10)  Pen Needles 31g X 8 Mm  Misc (Insulin Pen Needle) 11)  Onetouch Ultra Test   Strp (Glucose Blood) .... Testing 3 Times/day 12)  Trazodone Hcl 150 Mg  Tabs (Trazodone Hcl) .... Take At Bedtime 13)  Miralax  Powd (Polyethylene Glycol 3350) 14)  Glimepiride 4 Mg Tabs (Glimepiride) .[name].. 1 By Mouth Daily 15)  Carvedilol 25 Mg Tabs (Carvedilol) .... One and 1/2 Tablets Twice A Day 16)  Proair Hfa 108 (90 Base)  Mcg/act Aers (Albuterol Sulfate) .... As Needed 17)  Spiriva Handihaler 18 Mcg Caps (Tiotropium Bromide Monohydrate) .... Daily 18)  Vitamin D (Ergocalciferol) 50000 Unit Caps (Ergocalciferol) .[name].. 1 By Mouth Weekly 19)  Ferretts 325 (106 Fe) Mg Tabs (Ferrous Fumarate) .[name].. 1 By Mouth Daily  Allergies (verified): 1)  ! Penicillin  Past History:  Past Medical History: Reviewed history from 12/15/2008 and no changes required. COPD Coronary artery disease. Cardiomyopathy (EF had been 35%.). Depression with psychotic features Diabetes mellitus, type I Diverticulosis, hx of GERD Hypertension Pancreatitis, hx of Sleep Apnea Colon Polyp, hx of Obesity Dyslipidemia  Past Surgical History: Reviewed history from 04/23/2008 and no changes required. Uvuloplasty TAH, BS&O (for fibroids) Hysterectomy  Review of Systems       Positive for sensation of vaginal or bladder prolapse when she urinates, positive for neuropathic leg pain. Negative for all other systems.   Vital Signs:  Patient profile:   65 year old female Height:      70 inches Weight:      233 pounds BMI:     33.55 Pulse rate:   90 / minute Resp:     18 per minute BP sitting:   146 / 78  (right arm)  Vitals Entered By: Marrion Coy, CNA (August 27, 2009 8:55 AM)  Physical Exam  General:  Well developed, well nourished, in no acute distress. Head:  normocephalic and atraumatic Eyes:  PERRLA/EOM intact; conjunctiva and lids normal. Mouth:  Gums and palate normal. Oral mucosa normal. Neck:  Neck supple, no JVD. No masses, thyromegaly or abnormal cervical nodes. Chest Wall:  no deformities or breast masses noted Lungs:  Clear bilaterally to auscultation and percussion. Heart:  regular rhythm, normal rate, and no murmurs.   Abdomen:  Bowel sounds positive; abdomen soft and non-tender without masses, organomegaly, or hernias noted. No hepatosplenomegaly. Msk:  Back normal, normal gait. Muscle strength and tone  normal. Pulses:  normal pedal pulses bilaterally.   Extremities:  No clubbing or cyanosis. Conractures of the third and fourth fingers on both hands Neurologic:  Alert and oriented x 3. Skin:  Intact without lesions or rashes. Cervical Nodes:  no significant adenopathy Psych:  Normal affect.   Impression & Recommendations:  Problem # 1:  HYPERTENSION (ICD-401.9) Her blood pressure is much better controlled. It is in the 130s over 80s at home. She will continue the meds as listed.  Problem # 2:  CORONARY ARTERY DISEASE (ICD-414.00) She is having no new symptoms. No further cardiovascular testing is suggested. She will continue with risk reduction. Her updated medication list for this problem includes:    Altace 10 Mg Caps (Ramipril) .[name]... Take one tablet by mouth once daily    Aspirin 81 Mg Tbec (Aspirin) .[name]... Take one tablet by mouth once daily    Carvedilol 25 Mg Tabs (Carvedilol) ..... One and 1/2 tablets twice a day  Problem # 3:  CONGESTIVE HEART FAILURE (ICD-428.0)  She seems to be euvolemic. She will continue with meds as listed.  Her updated medication list for this problem includes:    Altace 10 Mg Caps (Ramipril) .[name]... Take one tablet by mouth once daily    Furosemide 80 Mg Tabs (Furosemide) .[name]... Take 1 tablet by mouth once a day    Aspirin 81 Mg Tbec (Aspirin) .[name]... Take one tablet by mouth once daily    Carvedilol 25 Mg Tabs (Carvedilol) ..... One and 1/2 tablets twice a day  Problem # 4:  Questionable vaginal prolapse The patient describes this sensation. She says she feels like "everything is falling out". I have given her a name of a GYN to investigate this.  Patient Instructions: 1)  Your physician recommends that you continue on your current medications as directed. Please refer to the Current Medication list given to you today. 2)  Your physician wants you to follow-up in:  6 months. You will receive a reminder letter in the mail two months in advance. If you  don't receive a letter, please call our office to schedule the follow-up appointment. 3)  GYN MD info: Dr. Tawanna Cooler Meisinger- [phone].

## 2010-02-01 NOTE — Letter (Signed)
Summary: [facility]  [facility]   Imported By: Marylou Mccoy 11/08/2009 09:44:32  _____________________________________________________________________  External Attachment:    Type:   Image     Comment:   External Document

## 2010-02-01 NOTE — Progress Notes (Signed)
Summary: OV needed for CMN on CPAP  Phone Note Outgoing Call   Call placed by: Michel Bickers CMA,  June 22, 2009 9:26 AM Call placed to: Patient Summary of Call: CMN for CPAP supplies received by [facility]. The patient will need ROV with Dr. Craige Cotta. She was last seen in the office on 01/16/2007.  Spoke with patient and she can come in July for appt. Sch for 07/20/2009 @ 9:15am. Michel Bickers CMA  June 22, 2009 9:40 AM Initial call taken by: Michel Bickers CMA,  June 22, 2009 9:27 AM

## 2010-02-01 NOTE — Letter (Signed)
Summary: [facility] Clinic Note   [facility] Clinic Note   Imported By: Roderic Ovens 10/13/2009 10:39:43  _____________________________________________________________________  External Attachment:    Type:   Image     Comment:   External Document

## 2010-02-01 NOTE — Consult Note (Signed)
Summary: [name] New Patient Evaluation  [facility] Renal New Patient Evaluation   Imported By: Roderic Ovens 11/15/2009 11:25:30  _____________________________________________________________________  External Attachment:    Type:   Image     Comment:   External Document

## 2010-02-01 NOTE — Assessment & Plan Note (Signed)
Summary: 6 MONTHS  [facility],RN   Visit Type:  Follow-up Primary Provider:  Dr.Avbuere  CC:  Cardiomyopathy.  History of Present Illness: The patient presents for followup of cardiomyopathy and coronary disease. Since I last saw her she has continued to have problems with abdominal discomfort. She is seeing a physician out from [facility] and is to be referred to a gastroenterologist. She did have abdominal pelvic CT which demonstrated some renal artery plaque and diverticulosis. She is limited in her activities because of this discomfort. She is not exercising as much as I would like. She is not however having chest pressure, neck or arm discomfort. She is not having any palpitations, presyncope or syncope. She is not having any PND or orthopnea.  Of note lipids done earlier this month demonstrated total cholesterol 146, triglycerides 116, HDL 42 and LDL 80. Her Caduet was increased to 10/40.  Current Medications (verified): 1)  Metformin Hcl 500 Mg Tabs (Metformin Hcl) .... Take One Tablet By Mouth Two Times A Day 2)  Novolog Flexpen 100 Unit/ml Soln (Insulin Aspart) .... Take 7-10 Units Three Times Daily Before Meals As Directed 3)  Altace 10 Mg Caps (Ramipril) .... Take One Tablet By Mouth Once Daily 4)  Caduet 10-40 Mg Tabs (Amlodipine-Atorvastatin) .[name].. 1 By Mouth Daily 5)  Furosemide 80 Mg Tabs (Furosemide) .... Take 1 Tablet By Mouth Once A Day 6)  Aspirin 81 Mg Tbec (Aspirin) .... Take One Tablet By Mouth Once Daily 7)  Protonix 40 Mg Tbec (Pantoprazole Sodium) .... Take One Tablet By Mouth Once Daily 8)  Effexor Xr 150 Mg Cp24 (Venlafaxine Hcl) .... Take 2 Capsules By Mouth Once A Day 9)  Lantus For Opticlik 100 Unit/ml  Soln (Insulin Glargine) .... Inject 44 Units Subcutaneously Once Daily 10)  Pen Needles 31g X 8 Mm  Misc (Insulin Pen Needle) 11)  Onetouch Ultra Test   Strp (Glucose Blood) .... Testing 3 Times/day 12)  Trazodone Hcl 150 Mg  Tabs (Trazodone Hcl) .... Take At Bedtime 13)   Miralax  Powd (Polyethylene Glycol 3350) 14)  Flexeril 5 Mg Tabs (Cyclobenzaprine Hcl) .... Prn 15)  Arthrotec 75 75-200 Mg-Mcg Tabs (Diclofenac-Misoprostol) .... Prn 16)  Hydrocodone-Acetaminophen 5-500 Mg Tabs (Hydrocodone-Acetaminophen) .... As Needed 17)  Glimepiride 4 Mg Tabs (Glimepiride) .[name].. 1 By Mouth Daily 18)  Carvedilol 25 Mg Tabs (Carvedilol) .... One By Mouth Twice A Day 19)  Proair Hfa 108 (90 Base) Mcg/act Aers (Albuterol Sulfate) .... As Needed 20)  Spiriva Handihaler 18 Mcg Caps (Tiotropium Bromide Monohydrate) .... Daily  Allergies: 1)  ! Penicillin  Past History:  Past Medical History: Reviewed history from 12/15/2008 and no changes required. COPD Coronary artery disease. Cardiomyopathy (EF had been 35%.). Depression with psychotic features Diabetes mellitus, type I Diverticulosis, hx of GERD Hypertension Pancreatitis, hx of Sleep Apnea Colon Polyp, hx of Obesity Dyslipidemia  Past Surgical History: Reviewed history from 04/23/2008 and no changes required. Uvuloplasty TAH, BS&O (for fibroids) Hysterectomy  Review of Systems       As stated in the HPI and negative for all other systems except for contractures of the third and fourth fingers of  both hands   Vital Signs:  Patient profile:   65 year old female Height:      70 inches Weight:      227 pounds BMI:     32.69 Pulse rate:   83 / minute BP sitting:   138 / 70  (right arm)  Vitals Entered By: Marrion Coy,  CNA (June 16, 2009 9:20 AM)  Physical Exam  General:  Well developed, well nourished, in no acute distress. Head:  normocephalic and atraumatic Neck:  Neck supple, no JVD. No masses, thyromegaly or abnormal cervical nodes. Chest Wall:  no deformities or breast masses noted Lungs:  Clear bilaterally to auscultation and percussion. Abdomen:  Bowel sounds positive; abdomen soft and non-tender without masses, organomegaly, or hernias noted. No hepatosplenomegaly. Msk:  Back normal,  normal gait. Muscle strength and tone normal. Extremities:  No clubbing or cyanosis. Conractures of the third and fourth fingers on both hands Neurologic:  Alert and oriented x 3. Skin:  Intact without lesions or rashes. Cervical Nodes:  no significant adenopathy Axillary Nodes:  no significant adenopathy Inguinal Nodes:  no significant adenopathy Psych:  Normal affect.   Detailed Cardiovascular Exam  Neck    Carotids: Carotids full and equal bilaterally without bruits.      Neck Veins: Normal, no JVD.    Heart    Inspection: no deformities or lifts noted.      Palpation: normal PMI with no thrills palpable.      Auscultation: regular rate and rhythm, S1, S2 without murmurs, rubs, gallops, or clicks.    Vascular    Abdominal Aorta: no palpable masses, pulsations, or audible bruits.      Femoral Pulses: normal femoral pulses bilaterally.      Pedal Pulses: normal pedal pulses bilaterally.      Radial Pulses: normal radial pulses bilaterally.      Peripheral Circulation: no clubbing, cyanosis, or edema noted with normal capillary refill.     EKG  Procedure date:  06/16/2009  Findings:      sinus rhythm, rate83, axis within normal limits, first degree AV block, prolonged QT  Impression & Recommendations:  Problem # 1:  CONGESTIVE HEART FAILURE (ICD-428.0) Today I will titrate her carvedilol to 37.5 mg b.i.d. She is trying to get back to a 50 mg b.i.d. dose which she was on previously before it mysteriously disappeared from her med list.  She will remained on the other meds as listed.  Problem # 2:  DYSLIPIDEMIA (ICD-272.4) I agree with titration of her statin as an ideal LDL would be in the 70s or lower. I would like for her HDL to be 50 as well.  Problem # 3:  HYPERTENSION (ICD-401.9) Her blood pressures being treated in the context of managing her cardiomyopathy.  Problem # 4:  ABNORMAL ELECTROCARDIOGRAM (ICD-794.31) I did review her EKGs. Her QTC is not as long as  calculated by the computer. It is slightly prolonged and unchanged from previous. She should avoid QT prolonging drugs. She's had no presyncope or syncope or symptoms of QT prolonged syndrome.  Other Orders: EKG w/ Interpretation (93000)  Patient Instructions: 1)  Your physician recommends that you schedule a follow-up appointment in: 2 months with Dr Antoine Poche 2)  Your physician has recommended you make the following change in your medication: Increase carvedilol to 25 mg one and 1/2 tablets twice a day. 3)  [facility]  4)  Dr Dairl Ponder 5)  [address] 6)  [place], [place] 7)  [phone] 8)  You have been diagnosed with Congestive Heart Failure or CHF.  CHF is a condition in which a problem with the structure or function of the heart impairs its ability to supply sufficient blood flow to meet the body's needs.  For further information please visit www.cardiosmart.org for detailed information on CHF. 9)  Your physician recommends that you weigh, daily, at the same time every day, and in the same amount of clothing.  Please record your daily weights on the handout provided and bring it to your next appointment. Prescriptions: CARVEDILOL 25 MG TABS (CARVEDILOL) one and 1/2 tablets twice a day  #90 x 6   Entered by:   Charolotte Capuchin, RN   Authorized by:   [name], MD, [facility]   Signed by:   Charolotte Capuchin, RN on 06/16/2009   Method used:   Electronically to        [facility]. [address] [phone]* (retail)       [address]       [place], [place]  [address]       Ph: [phone] or [phone]       Fax: [phone]   RxID:   [id number]

## 2010-02-01 NOTE — Assessment & Plan Note (Signed)
Summary: 6wk f/u ok per pam   Visit Type:  Follow-up Primary Provider:  Dr.Avbuere  CC:  CHF.  History of Present Illness: The patient presents for followup of her cardiomyopathy. At the last visit she had somehow come off of her beta blocker and I began to reinitiate this. I started with carvedilol 12-1/2 mg b.i.d. She had been on 50 mg b.i.d. She had also been volume overloaded and it took a few days of extra Lasix. She said she had significant improvement. Since that time she has had no further acute dyspnea. She denies any PND or orthopnea. She has had no swelling. She has had weight gain but she admits to eating too much. She says this is because she has stopped smoking. She denies any chest pressure, neck or arm discomfort. She has had no palpitations, presyncope or syncope. She has had no problems taking the carvedilol.  Current Medications (verified): 1)  Metformin Hcl 500 Mg Tabs (Metformin Hcl) .... Take One Tablet By Mouth Two Times A Day 2)  Novolog Flexpen 100 Unit/ml Soln (Insulin Aspart) .... Take 7-10 Units Three Times Daily Before Meals As Directed 3)  Altace 10 Mg Caps (Ramipril) .... Take One Tablet By Mouth Once Daily 4)  Caduet 10-20 Mg Tabs (Amlodipine-Atorvastatin) .... Take 1 Tablet By Mouth Once A Day 5)  Furosemide 80 Mg Tabs (Furosemide) .... Take 1 Tablet By Mouth Once A Day 6)  Aspirin 81 Mg Tbec (Aspirin) .... Take One Tablet By Mouth Once Daily 7)  Protonix 40 Mg Tbec (Pantoprazole Sodium) .... Take One Tablet By Mouth Once Daily 8)  Effexor Xr 150 Mg Cp24 (Venlafaxine Hcl) .... Take 2 Capsules By Mouth Once A Day 9)  Lantus For Opticlik 100 Unit/ml  Soln (Insulin Glargine) .... Inject 44 Units Subcutaneously Once Daily 10)  Pen Needles 31g X 8 Mm  Misc (Insulin Pen Needle) 11)  Onetouch Ultra Test   Strp (Glucose Blood) .... Testing 4 Times/day 12)  Trazodone Hcl 150 Mg  Tabs (Trazodone Hcl) .... Take At Bedtime 13)  Miralax  Powd (Polyethylene Glycol  3350) 14)  Flexeril 5 Mg Tabs (Cyclobenzaprine Hcl) .... Prn 15)  Arthrotec 75 75-200 Mg-Mcg Tabs (Diclofenac-Misoprostol) .... Prn 16)  Hydrocodone-Acetaminophen 5-500 Mg Tabs (Hydrocodone-Acetaminophen) .... As Needed 17)  Glimepiride 4 Mg Tabs (Glimepiride) .[name].. 1 By Mouth Daily 18)  Carvedilol 12.5 Mg Tabs (Carvedilol) .... One By Mouth Bid 19)  Proair Hfa 108 (90 Base) Mcg/act Aers (Albuterol Sulfate) .... As Needed 20)  Spiriva Handihaler 18 Mcg Caps (Tiotropium Bromide Monohydrate) .... Daily  Allergies (verified): 1)  ! Codeine 2)  ! Penicillin  Past History:  Past Medical History: Reviewed history from 12/15/2008 and no changes required. COPD Coronary artery disease. Cardiomyopathy (EF had been 35%.). Depression with psychotic features Diabetes mellitus, type I Diverticulosis, hx of GERD Hypertension Pancreatitis, hx of Sleep Apnea Colon Polyp, hx of Obesity Dyslipidemia  Past Surgical History: Reviewed history from 04/23/2008 and no changes required. Uvuloplasty TAH, BS&O (for fibroids) Hysterectomy  Review of Systems       As stated in the HPI and negative for all other systems.   Vital Signs:  Patient profile:   65 year old female Height:      70 inches Weight:      225 pounds BMI:     32.40 Pulse rate:   91 / minute Resp:     18 per minute BP sitting:   141 / 81  (right arm)  Vitals Entered By: Marrion Coy, CNA (January 25, 2009 11:26 AM)  Physical Exam  General:  Well developed, well nourished, in no acute distress. Head:  normocephalic and atraumatic Eyes:  PERRLA/EOM intact; conjunctiva and lids normal. Mouth:  Gums and palate normal. Oral mucosa normal. Neck:  Neck supple, no JVD. No masses, thyromegaly or abnormal cervical nodes. Chest Wall:  no deformities or breast masses noted Lungs:  Clear bilaterally to auscultation and percussion. Abdomen:  Bowel sounds positive; abdomen soft and non-tender without masses, organomegaly, or  hernias noted. No hepatosplenomegaly, obese Msk:  Back normal, normal gait. Muscle strength and tone normal. Extremities:  No clubbing or cyanosis. Neurologic:  Alert and oriented x 3. Skin:  Intact without lesions or rashes. Psych:  Normal affect.   Detailed Cardiovascular Exam  Neck    Carotids: Carotids full and equal bilaterally without bruits.      Neck Veins: Normal, no JVD.    Heart    Inspection: no deformities or lifts noted.      Palpation: normal PMI with no thrills palpable.      Auscultation: S1 and S2 within normal limits, positive S3, no S4, no clicks, no rubs  Vascular    Abdominal Aorta: no palpable masses, pulsations, or audible bruits.      Femoral Pulses: normal femoral pulses bilaterally.      Pedal Pulses: normal pedal pulses bilaterally.      Radial Pulses: normal radial pulses bilaterally.      Peripheral Circulation: no clubbing, cyanosis, or edema noted with normal capillary refill.     Impression & Recommendations:  Problem # 1:  CONGESTIVE HEART FAILURE (ICD-428.0) She he is euvolemic. I will continue to titrate her medications and increase her carvedilol to 25 mg b.i.d. with a target still being 50 mg b.i.d.  Problem # 2:  OBESITY NOS (ICD-278.00) She has gained weight. However, this is secondary to overeating related to her stopping smoking. She understands the need to lose weight with diet and exercise.  Problem # 3:  HYPERTENSION (ICD-401.9) Her blood pressure is slightly elevated but will be treated in the context of managing her cardiomyopathy.  Problem # 4:  CORONARY ARTERY DISEASE (ICD-414.00) She is having no chest pain. No further cardiovascular testing is suggested.  Patient Instructions: 1)  Your physician recommends that you schedule a follow-up appointment in: 2 months with Dr Antoine Poche 2)  Your physician has recommended you make the following change in your medication: Increase Carvedilol to 25 mg twice a day 3)  You have been  diagnosed with Congestive Heart Failure or CHF.  CHF is a condition in which a problem with the structure or function of the heart impairs its ability to supply sufficient blood flow to meet the body's needs.  For further information please visit www.cardiosmart.org for detailed information on CHF. 4)  Your physician recommends that you weigh, daily, at the same time every day, and in the same amount of clothing.  Please record your daily weights on the handout provided and bring it to your next appointment. Prescriptions: FUROSEMIDE 80 MG TABS (FUROSEMIDE) Take 1 tablet by mouth once a day  #90 x 3   Entered by:   Charolotte Capuchin, RN   Authorized by:   [name], MD, [facility]   Signed by:   Charolotte Capuchin, RN on 01/25/2009   Method used:   Faxed to ...       [facility] (mail-order)       [phone]       [place], [place]  [address]       Ph: [phone]       Fax: [phone]   RxID:   [id number] CARVEDILOL 25 MG TABS (CARVEDILOL) one by mouth twice a day  #90 x 3   Entered by:   Charolotte Capuchin, RN   Authorized by:   [name], MD, [facility]   Signed by:   Charolotte Capuchin, RN on 01/25/2009   Method used:   Faxed to ...       [facility] (mail-order)       [address] [place], [place]  [address]       Ph: [phone]       Fax: [phone]   RxID:   [id number]

## 2010-02-01 NOTE — Letter (Signed)
Summary: MAMMOGRAM  MAMMOGRAM   Imported By: Margie Billet 04/01/2009 15:02:15  _____________________________________________________________________  External Attachment:    Type:   Image     Comment:   External Document

## 2010-02-02 ENCOUNTER — Encounter: Payer: Self-pay | Admitting: Endocrinology

## 2010-02-03 NOTE — Letter (Signed)
Summary: CMN for CPAP Supplies/[facility]  CMN for CPAP Supplies/[facility]   Imported By: Sherian Rein 01/18/2010 10:21:17  _____________________________________________________________________  External Attachment:    Type:   Image     Comment:   External Document

## 2010-02-03 NOTE — Letter (Signed)
Summary: Denial/[facility]  Denial/[facility]   Imported By: Lester [place] 12/20/2009 07:41:43  _____________________________________________________________________  External Attachment:    Type:   Image     Comment:   External Document

## 2010-02-03 NOTE — Assessment & Plan Note (Signed)
Summary: last ov 01/16/07 for OSA/needs supplies/LC   Visit Type:  Follow-up Primary Provider/Referring Provider:  Dr.Avbuere  CC:  OSA follow-up...last seen 01/16/2007...no cpap use x1 month...patient c/o mold and needs new parts for her machine...new mask needed.  History of Present Illness: I saw [name] in f/u today for her COPD, OSA, and tobacco abuse.  I last saw [name] in 2009.  She has since stopped smoking after using chantix.  She uses spiriva and proair.  She has been using proair twice per day.  This helps.  She gets winded easily with exertion, and wheezing.  She does not have much cough or sputum.  She denies fever, or hemoptysis.  Her sinuses and throat have been okay.  She was having leg swelling, but this improved after her lasix was increased.  She has not been using her CPAP recently.  She noticed mold build up on the machine.  This was helping with her sleep and energy level otherwise.  She was using a full face mask, but wants to try a smaller mask.  Spirometry today: FEV1 1.7(68%), FVC 2.2(70%), FEV1% 76.  Pt had coughing during test.  Current Medications (verified): 1)  Metformin Hcl 500 Mg Tabs (Metformin Hcl) .... Take One Tablet By Mouth Two Times A Day 2)  Novolog Flexpen 100 Unit/ml Soln (Insulin Aspart) .... Take 8-12 Units Three Times Daily Before Meals As Directed 3)  Altace 10 Mg Caps (Ramipril) .... Take One Tablet By Mouth Once Daily 4)  Caduet 10-40 Mg Tabs (Amlodipine-Atorvastatin) .[name].. 1 By Mouth Daily 5)  Furosemide 80 Mg Tabs (Furosemide) .... Take 1 Tablet By Mouth Once A Day 6)  Aspirin 81 Mg Tbec (Aspirin) .... Take One Tablet By Mouth Once Daily 7)  Protonix 40 Mg Tbec (Pantoprazole Sodium) .[name].. 1 By Mouth Two Times A Day 8)  Effexor Xr 150 Mg Cp24 (Venlafaxine Hcl) .[name].. 1 By Mouth Two Times A Day 9)  Lantus For Opticlik 100 Unit/ml  Soln (Insulin Glargine) .... Inject 60 Units Subcutaneously Once Daily 10)  Pen Needles 31g X 8 Mm  Misc  (Insulin Pen Needle) 11)  Onetouch Ultra Test   Strp (Glucose Blood) .... Testing 3 Times/day 12)  Trazodone Hcl 150 Mg  Tabs (Trazodone Hcl) .... Take At Bedtime 13)  Miralax  Powd (Polyethylene Glycol 3350) 14)  Glimepiride 4 Mg Tabs (Glimepiride) .[name].. 1 By Mouth Daily 15)  Carvedilol 25 Mg Tabs (Carvedilol) .... One and 1/2 Tablets Twice A Day 16)  Proair Hfa 108 (90 Base) Mcg/act Aers (Albuterol Sulfate) .... As Needed 17)  Spiriva Handihaler 18 Mcg Caps (Tiotropium Bromide Monohydrate) .... Daily 18)  Vitamin D 2000 Unit Tabs (Cholecalciferol) .[name].. 1 By Mouth Daily 19)  Ferretts 325 (106 Fe) Mg Tabs (Ferrous Fumarate) .[name].. 1 By Mouth Two Times A Day 20)  Diclofenac Sodium 75 Mg Tbec (Diclofenac Sodium) .[name].. 1 By Mouth Daily 21)  Hydroxyzine Hcl 50 Mg Tabs (Hydroxyzine Hcl) .[name].. 1 By Mouth Two Times A Day 22)  Klor-Con 10 10 Meq Cr-Tabs (Potassium Chloride) .[name].. 1 By Mouth Daily  Allergies (verified): 1)  ! Penicillin  Past History:  Past Medical History: COPD      - Spirometry 01/10/10 FEV1 1.7(68%) Coronary artery disease. Cardiomyopathy (EF had been 35%.). Depression with psychotic features Diabetes mellitus, type I Diverticulosis, hx of GERD Hypertension Pancreatitis, hx of Obstructive Sleep Apnea      - PSG 03/28/06 AHI 15      - CPAP 10  cm Colon Polyp, hx of Obesity Dyslipidemia  Past Surgical History: Reviewed history from 04/23/2008 and no changes required. Uvuloplasty TAH, BS&O (for fibroids) Hysterectomy  Family History: Mother - CVA age 83 Father - Old age 2 Brother - ETOH Sister - Breast cancer Sister - Emphysema  Social History: Married.  Quit smoking in 2009.  Step children through her husband.  Vital Signs:  Patient profile:   65 year old female Height:      70 inches (177.80 cm) Weight:      240 pounds (109.09 kg) BMI:     34.56 O2 Sat:      95 % on Room air Temp:     97.7 degrees F (36.50 degrees C) oral Pulse rate:   83 / minute BP  sitting:   124 / 78  (left arm) Cuff size:   large  Vitals Entered By: Michel Bickers CMA (January 10, 2010 9:06 AM)  O2 Sat at Rest %:  95 O2 Flow:  Room air CC: OSA follow-up...last seen 01/16/2007...no cpap use x1 month...patient c/o mold and needs new parts for her machine...new mask needed Comments Medications reviewed with patient Michel Bickers CMA  January 10, 2010 9:20 AM   Physical Exam  General:  normal appearance and healthy appearing.   Nose:  no deformity, discharge, inflammation, or lesions Mouth:  MP3, wears dentures, no exudate Neck:  no JVD.   Lungs:  decreased breath sounds, no wheezing or rales Heart:  regular rhythm, normal rate, and no murmurs.   Extremities:  no clubbing, cyanosis, edema, or deformity noted Neurologic:  normal CN II-XII and strength normal.   Cervical Nodes:  no significant adenopathy Psych:  alert and cooperative; normal mood and affect; normal attention span and concentration   Impression & Recommendations:  Problem # 1:  OBSTRUCTIVE SLEEP APNEA (ICD-327.23) Will have her CPAP set up checked by her DME.  Will arrange for a new mask.  Will get a download from her machine.  Problem # 2:  COPD (ICD-496) Will try her again on symbicort to see if this improves her symptoms.  She is to continue spiriva and as needed proair.  Medications Added to Medication List This Visit: 1)  Symbicort 160-4.5 Mcg/act Aero (Budesonide-formoterol fumarate) .... Two puffs two times a day 2)  Novolog Flexpen 100 Unit/ml Soln (Insulin aspart) .... Take 8-12 units three times daily before meals as directed 3)  Lantus For Opticlik 100 Unit/ml Soln (Insulin glargine) .... Inject 60 units subcutaneously once daily 4)  Klor-con 10 10 Meq Cr-tabs (Potassium chloride) .[name].. 1 by mouth daily 5)  Protonix 40 Mg Tbec (Pantoprazole sodium) .[name].. 1 by mouth two times a day 6)  Effexor Xr 150 Mg Cp24 (Venlafaxine hcl) .[name].. 1 by mouth two times a day 7)  Vitamin D 2000 Unit Tabs  (Cholecalciferol) .[name].. 1 by mouth daily 8)  Ferretts 325 (106 Fe) Mg Tabs (Ferrous fumarate) .[name].. 1 by mouth two times a day 9)  Diclofenac Sodium 75 Mg Tbec (Diclofenac sodium) .[name].. 1 by mouth daily 10)  Hydroxyzine Hcl 50 Mg Tabs (Hydroxyzine hcl) .[name].. 1 by mouth two times a day  Complete Medication List: 1)  Symbicort 160-4.5 Mcg/act Aero (Budesonide-formoterol fumarate) .... Two puffs two times a day 2)  Spiriva Handihaler 18 Mcg Caps (Tiotropium bromide monohydrate) .... Daily 3)  Proair Hfa 108 (90 Base) Mcg/act Aers (Albuterol sulfate) .... As needed 4)  Glimepiride 4 Mg Tabs (Glimepiride) .[name].. 1 by mouth daily 5)  Metformin Hcl 500 Mg Tabs (Metformin hcl) .... Take one tablet by mouth two times a day 6)  Novolog Flexpen 100 Unit/ml Soln (Insulin aspart) .... Take 8-12 units three times daily before meals as directed 7)  Lantus For Opticlik 100 Unit/ml Soln (Insulin glargine) .... Inject 60 units subcutaneously once daily 8)  Pen Needles 31g X 8 Mm Misc (Insulin pen needle) 9)  Onetouch Ultra Test Strp (Glucose blood) .... Testing 3 times/day 10)  Carvedilol 25 Mg Tabs (Carvedilol) .... One and 1/2 tablets twice a day 11)  Altace 10 Mg Caps (Ramipril) .... Take one tablet by mouth once daily 12)  Caduet 10-40 Mg Tabs (Amlodipine-atorvastatin) .[name].. 1 by mouth daily 13)  Furosemide 80 Mg Tabs (Furosemide) .... Take 1 tablet by mouth once a day 14)  Klor-con 10 10 Meq Cr-tabs (Potassium chloride) .[name].. 1 by mouth daily 15)  Aspirin 81 Mg Tbec (Aspirin) .... Take one tablet by mouth once daily 16)  Protonix 40 Mg Tbec (Pantoprazole sodium) .[name].. 1 by mouth two times a day 17)  Effexor Xr 150 Mg Cp24 (Venlafaxine hcl) .[name].. 1 by mouth two times a day 18)  Trazodone Hcl 150 Mg Tabs (Trazodone hcl) .... Take at bedtime 19)  Miralax Powd (Polyethylene glycol 3350) 20)  Vitamin D 2000 Unit Tabs (Cholecalciferol) .[name].. 1 by mouth daily 21)  Ferretts 325 (106 Fe) Mg Tabs (Ferrous fumarate) .[name].. 1  by mouth two times a day 22)  Diclofenac Sodium 75 Mg Tbec (Diclofenac sodium) .[name].. 1 by mouth daily 23)  Hydroxyzine Hcl 50 Mg Tabs (Hydroxyzine hcl) .[name].. 1 by mouth two times a day  Other Orders: Spirometry w/Graph (94010) DME Referral (DME) Est. Patient Level IV (04540)  Patient Instructions: 1)  Symbicort two puffs two times a day, and rinse mouth after using 2)  Spiriva one puff once daily 3)  Proair two puffs up to four times per day as needed 4)  Will get report from CPAP machine 5)  Will arrange for new CPAP mask 6)  Follow up in 3 months Prescriptions: SYMBICORT 160-4.5 MCG/ACT AERO (BUDESONIDE-FORMOTEROL FUMARATE) two puffs two times a day  #1 x 6   Entered and Authorized by:   [name] MD   Signed by:   [name] MD on 01/10/2010   Method used:   Electronically to        [facility] [address] [phone]* (retail)       [address]       [place], [place]  [address]       Ph: [phone] or [phone]       Fax: [phone]   RxID:   [id number]    Immunization History:  Influenza Immunization History:    Influenza:  historical (01/02/2009)  Pneumovax Immunization History:    Pneumovax:  historical (01/03/2007)

## 2010-02-03 NOTE — Letter (Signed)
Summary: CMN for PAP Supplies/[facility]  CMN for PAP Supplies/[facility]   Imported By: Sherian Rein 01/28/2010 07:31:48  _____________________________________________________________________  External Attachment:    Type:   Image     Comment:   External Document

## 2010-02-14 ENCOUNTER — Ambulatory Visit: Payer: Self-pay | Admitting: Endocrinology

## 2010-02-17 NOTE — Letter (Signed)
Summary: CMN for Humidifier & Mask/[facility]  CMN for Humidifier & Mask/[facility]   Imported By: Sherian Rein 02/08/2010 09:38:36  _____________________________________________________________________  External Attachment:    Type:   Image     Comment:   External Document

## 2010-04-04 ENCOUNTER — Encounter: Payer: Self-pay | Admitting: Cardiology

## 2010-04-05 ENCOUNTER — Encounter: Payer: Self-pay | Admitting: Cardiology

## 2010-04-05 ENCOUNTER — Ambulatory Visit (INDEPENDENT_AMBULATORY_CARE_PROVIDER_SITE_OTHER): Payer: Medicare Other | Admitting: Cardiology

## 2010-04-05 VITALS — BP 140/90 | HR 84

## 2010-04-05 DIAGNOSIS — G4733 Obstructive sleep apnea (adult) (pediatric): Secondary | ICD-10-CM

## 2010-04-05 DIAGNOSIS — R140 Abdominal distension (gaseous): Secondary | ICD-10-CM

## 2010-04-05 DIAGNOSIS — E669 Obesity, unspecified: Secondary | ICD-10-CM

## 2010-04-05 DIAGNOSIS — R079 Chest pain, unspecified: Secondary | ICD-10-CM

## 2010-04-05 DIAGNOSIS — E785 Hyperlipidemia, unspecified: Secondary | ICD-10-CM

## 2010-04-05 DIAGNOSIS — K219 Gastro-esophageal reflux disease without esophagitis: Secondary | ICD-10-CM

## 2010-04-05 DIAGNOSIS — E119 Type 2 diabetes mellitus without complications: Secondary | ICD-10-CM

## 2010-04-05 DIAGNOSIS — I509 Heart failure, unspecified: Secondary | ICD-10-CM

## 2010-04-05 DIAGNOSIS — I10 Essential (primary) hypertension: Secondary | ICD-10-CM

## 2010-04-05 MED ORDER — AMLODIPINE-ATORVASTATIN 10-20 MG PO TABS: 1.0000 | ORAL_TABLET | Freq: Every day | ORAL | Status: DC

## 2010-04-05 MED ORDER — CARVEDILOL 25 MG PO TABS: ORAL_TABLET | ORAL | Status: DC

## 2010-04-05 MED ORDER — FUROSEMIDE 80 MG PO TABS: 80.0000 mg | ORAL_TABLET | Freq: Every day | ORAL | Status: DC

## 2010-04-05 NOTE — Assessment & Plan Note (Addendum)
This will be followed by her primary MD.  Of note she was supposed to be taking Caduet 10/40 and I will renew this.

## 2010-04-05 NOTE — Assessment & Plan Note (Signed)
She is having no new chest discomfort no further cardiovascular testing is suggested.

## 2010-04-05 NOTE — Assessment & Plan Note (Signed)
She seems to be euvolemic. She will continue meds as listed.

## 2010-04-05 NOTE — Assessment & Plan Note (Signed)
She has multiple GI complaints including abdominal bloating and distention. I will refer her back to GI followup.

## 2010-04-05 NOTE — Assessment & Plan Note (Signed)
She is wearing her CPAP and has no problems with this.

## 2010-04-05 NOTE — Assessment & Plan Note (Signed)
The blood pressure is at target. No change in medications is indicated. We will continue with therapeutic lifestyle changes (TLC).  

## 2010-04-05 NOTE — Assessment & Plan Note (Signed)
This is followed by endocrinology. The patient also requests a new primary provider and I will try to facilitate this. She is no longer seeing anybody primary care.

## 2010-04-05 NOTE — Progress Notes (Signed)
HPI The patient returns for followup of hypertension dyspnea and chest discomfort. She has actually had no recent chest discomfort. She does continue to dyspnea and has been seen by our pulmonologist and is being managed actively for this but she still gets dyspnea with moderate activity. She is not describing PND or orthopnea. She is not describing chest pressure, neck or arm discomfort. She's had no palpitations, presyncope or syncope. She is complaining of continued abdominal distention and bloating and a sense that she has difficulty urinating and that she's having prolapse. She has not seen her gastroenterologist since 2009. She apparently did see GYN but apparently no surgery or other studies were needed. She's not been very active. She is not describing swelling but she does have weight gain probably from inactivity.  Allergies  Allergen Reactions  . Penicillins     Current Outpatient Prescriptions  Medication Sig Dispense Refill  . Albuterol Sulfate (PROAIR HFA IN) Inhale into the lungs as directed.        [name] amLODipine-atorvastatin (CADUET) 10-40 MG per tablet Take 1 tablet by mouth daily.        [name] aspirin 81 MG tablet Take 81 mg by mouth daily.        . budesonide-formoterol (SYMBICORT) 160-4.5 MCG/ACT inhaler Inhale 2 puffs into the lungs 2 (two) times daily.        . carvedilol (COREG) 25 MG tablet Take 37.5 mg by mouth 2 (two) times daily with a meal.        . Cholecalciferol (VITAMIN D) 2000 UNITS CAPS Take by mouth.        . ferrous fumarate (FERRETTS) 325 (106 FE) MG TABS Take 325 mg by mouth 2 (two) times daily.        [name] glimepiride (AMARYL) 4 MG tablet Take 4 mg by mouth daily before breakfast.        . hydrOXYzine (ATARAX) 50 MG tablet Take 50 mg by mouth 3 (three) times daily as needed.        . insulin glargine (LANTUS) 100 UNIT/ML injection Inject 60 Units into the skin 2 (two) times daily.        [name] lubiprostone (AMITIZA) 24 MCG capsule Take 24 mcg by mouth daily.        .  metFORMIN (GLUMETZA) 500 MG (MOD) 24 hr tablet Take 500 mg by mouth 2 (two) times daily with a meal.        . metoCLOPramide (REGLAN) 10 MG tablet 10 mg. Take with supper (1 tab) 30 min before       . pantoprazole (PROTONIX) 40 MG tablet Take 40 mg by mouth daily.        . potassium chloride (MICRO-K) 10 MEQ CR capsule Take 10 mEq by mouth daily.        . ramipril (ALTACE) 10 MG capsule Take 10 mg by mouth daily.        . traZODone (DESYREL) 150 MG tablet Take 150 mg by mouth at bedtime.        . Polyethylene Glycol 3350 (MIRALAX PO) Take by mouth. As directed          Past Medical History  Diagnosis Date  . DM 02/21/2006  . DYSLIPIDEMIA 04/23/2008  . OBESITY NOS 04/25/2006  . DEPRESSION, RECURRENT, IN PARTIAL REMISSION 03/08/2006  . HYPERTENSION 11/07/2005  . CORONARY ARTERY DISEASE 07/09/2000  . CONGESTIVE HEART FAILURE 12/11/2005  . COPD 11/07/2005  . GERD 11/07/2005  . DIVERTICULOSIS, COLON, WITH HEMORRHAGE 03/17/2001  .  BACK PAIN, LUMBAR, WITH RADICULOPATHY 03/07/2006  . ABNORMAL ELECTROCARDIOGRAM 06/16/2009  . OBSTRUCTIVE SLEEP APNEA 01/10/2010    Past Surgical History  Procedure Date  . Uvuloplasty   . Total abdominal hysterectomy w/ bilateral salpingoophorectomy     ROS As stated in the HPI and negative for all other systems.  PHYSICAL EXAM BP 140/90  Pulse 84 GENERAL:  Well appearing HEENT:  Pupils equal round and reactive, fundi not visualized, oral mucosa unremarkable NECK:  No jugular venous distention, waveform within normal limits, carotid upstroke brisk and symmetric, no bruits, no thyromegaly LYMPHATICS:  No cervical, inguinal adenopathy LUNGS:  Clear to auscultation bilaterally BACK:  No CVA tenderness CHEST:  Unremarkable HEART:  PMI not displaced or sustained,S1 and S2 within normal limits, no S3, no S4, no clicks, no rubs, no murmurs ABD:  Flat, positive bowel sounds normal in frequency in pitch, no bruits, no rebound, no guarding, no midline pulsatile mass, no  hepatomegaly, no splenomegaly EXT:  2 plus pulses throughout, no edema, no cyanosis no clubbing SKIN:  No rashes no nodules NEURO:  Cranial nerves II through XII grossly intact, motor grossly intact throughout PSYCH:  Cognitively intact, oriented to person place and time   EKG: Sinus rhythm with first degree AV block, no acute ST-T wave changes, nonspecific T-wave flattening unchanged from previous  ASSESSMENT AND PLAN

## 2010-04-05 NOTE — Patient Instructions (Addendum)
OK to return to exercise class You have been referred back to GI-Dr Marjie Skiff are being referred to primary care to establish

## 2010-04-12 LAB — POCT I-STAT GLUCOSE: Glucose, Bld: 73 mg/dL (ref 70–99)

## 2010-04-18 ENCOUNTER — Other Ambulatory Visit (HOSPITAL_COMMUNITY): Payer: Self-pay | Admitting: Internal Medicine

## 2010-04-18 DIAGNOSIS — Z1231 Encounter for screening mammogram for malignant neoplasm of breast: Secondary | ICD-10-CM

## 2010-05-06 ENCOUNTER — Encounter: Payer: Self-pay | Admitting: Internal Medicine

## 2010-05-09 ENCOUNTER — Ambulatory Visit (INDEPENDENT_AMBULATORY_CARE_PROVIDER_SITE_OTHER): Payer: Medicare Other | Admitting: Internal Medicine

## 2010-05-09 ENCOUNTER — Encounter: Payer: Self-pay | Admitting: Internal Medicine

## 2010-05-09 DIAGNOSIS — Z23 Encounter for immunization: Secondary | ICD-10-CM

## 2010-05-09 DIAGNOSIS — I10 Essential (primary) hypertension: Secondary | ICD-10-CM

## 2010-05-09 DIAGNOSIS — E119 Type 2 diabetes mellitus without complications: Secondary | ICD-10-CM

## 2010-05-09 NOTE — Assessment & Plan Note (Signed)
BP is well controlled today

## 2010-05-09 NOTE — Patient Instructions (Signed)
Diabetes, Type 2 Diabetes is a lasting (chronic) disease. In type 2 diabetes, the pancreas does not make enough insulin (a hormone), and the body does not respond normally to the insulin that is made. This type of diabetes was also previously called adult onset diabetes. About 90% of all those who have diabetes have type 2. It usually occurs after the age of 40 but can occur at any age. CAUSES Unlike type 1 diabetes, which happens because insulin is no longer being made, type 2 diabetes happens because the body is making less insulin and has trouble using the insulin properly. SYMPTOMS  Drinking more than usual.   Urinating more than usual.   Blurred vision.   Dry, itchy skin.   Frequent infection like yeast infections in women.   More tired than usual (fatigue).  TREATMENT  Healthy eating.   Exercise.   Medication, if needed.   Monitoring blood glucose (sugar).   Seeing your caregiver regularly.  HOME CARE INSTRUCTIONS  Check your blood glucose (sugar) at least once daily. More frequent monitoring may be necessary, depending on your medications and on how well your diabetes is controlled. Your caregiver will advise you.   Take your medicine as directed by your caregiver.   Do not smoke.   Make wise food choices. Ask your caregiver for information. Weight loss can improve your diabetes.   Learn about low blood glucose (hypoglycemia) and how to treat it.   Get your eyes checked regularly.   Have a yearly physical exam. Have your blood pressure checked. Get your blood and urine tested.   Wear a pendant or bracelet saying that you have diabetes.   Check your feet every night for sores. Let your caregiver know if you have sores that are not healing.  SEEK MEDICAL CARE IF:  You are having problems keeping your blood glucose at target range.   You feel you might be having problems with your medicines.   You have symptoms of an illness that is not improving after 24  hours.   You have a sore or wound that is not healing.   You notice a change in vision or a new problem with your vision.   You develop a fever of more than 100.5.  Document Released: 12/19/2004 Document Re-Released: 01/10/2009 [employer] Patient Information 2011 [employer]. 

## 2010-05-09 NOTE — Assessment & Plan Note (Signed)
Continue with the recommendations of Dr. Chestine Spore

## 2010-05-09 NOTE — Progress Notes (Signed)
Subjective:    Patient ID: [name], female    DOB: 1945/03/17, 65 y.o.   MRN: [id number]  Diabetes She presents for her follow-up diabetic visit. She has type 2 diabetes mellitus. Her disease course has been worsening. Hypoglycemia symptoms include dizziness (for one month, she attributes this to [place]). Pertinent negatives for hypoglycemia include no confusion, headaches, mood changes, nervousness/anxiousness, pallor, seizures, sleepiness, speech difficulty, sweats or tremors. Associated symptoms include polydipsia, polyphagia and polyuria. Pertinent negatives for diabetes include no blurred vision, no chest pain, no fatigue, no foot paresthesias, no foot ulcerations, no visual change, no weakness and no weight loss. There are no hypoglycemic complications. Symptoms are stable. Diabetic complications include heart disease. Current diabetic treatment includes intensive insulin program and oral agent (dual therapy). She is compliant with treatment all of the time. Her weight is stable. She is following a generally healthy diet. Meal planning includes avoidance of concentrated sweets. She has had a previous visit with a dietician. She never participates in exercise. There is no change in her home blood glucose trend. Her breakfast blood glucose range is generally 110-130 mg/dl. Her lunch blood glucose range is generally 130-140 mg/dl. Her dinner blood glucose range is generally 140-180 mg/dl. Her highest blood glucose is 140-180 mg/dl. Her overall blood glucose range is 130-140 mg/dl. An ACE inhibitor/angiotensin II receptor blocker is being taken. She does not see a podiatrist.Eye exam is current.   New to me she comes in because [name] told her that she needed a PCP, she has multiple medical problems and chronic complaints but sees many doctors about her issues - DM: Dr. Chestine Spore - recently increased her insulin dose COPD,OSA: Dr. Craige Cotta CAD: [name] Constipation: Dr. Christella Hartigan appt on May 17, 2010 PCPs: Drs. Avbuere and Adrige previously  All she asks from me today is for a Tdap booster.   Review of Systems  Constitutional: Negative for fever, chills, weight loss, diaphoresis, activity change, appetite change, fatigue and unexpected weight change.  HENT: Negative for hearing loss, ear pain, congestion, facial swelling, rhinorrhea, sneezing, neck pain, neck stiffness, postnasal drip and tinnitus.   Eyes: Negative for blurred vision, photophobia and visual disturbance.  Respiratory: Positive for apnea (and snoring). Negative for cough, choking, chest tightness, shortness of breath, wheezing and stridor.   Cardiovascular: Negative for chest pain, palpitations and leg swelling.  Gastrointestinal: Positive for constipation (chronic and unchanged for many years). Negative for nausea, vomiting, abdominal pain, diarrhea, blood in stool, abdominal distention, anal bleeding and rectal pain.  Genitourinary: Positive for polyuria. Negative for dysuria, urgency, frequency, hematuria, decreased urine volume, enuresis, difficulty urinating and dyspareunia.  Musculoskeletal: Negative for myalgias, back pain, joint swelling, arthralgias and gait problem.  Skin: Negative for color change, pallor and rash.  Neurological: Positive for dizziness (for one month, she attributes this to [place]). Negative for tremors, seizures, syncope, facial asymmetry, speech difficulty, weakness, light-headedness, numbness and headaches.  Hematological: Positive for polydipsia and polyphagia. Negative for adenopathy. Does not bruise/bleed easily.  Psychiatric/Behavioral: Negative for suicidal ideas, hallucinations, behavioral problems, confusion, sleep disturbance, self-injury, dysphoric mood, decreased concentration and agitation. The patient is not nervous/anxious and is not hyperactive.        Objective:   Physical Exam  [vitalsreviewed. Constitutional: She is oriented to person, place, and time. She appears  well-developed and well-nourished. No distress.  HENT:  Head: Normocephalic and atraumatic.  Right Ear: Hearing, tympanic membrane, external ear and ear canal normal. No swelling or tenderness. Tympanic membrane is  not injected. No decreased hearing is noted.  Left Ear: Hearing, tympanic membrane, external ear and ear canal normal. No swelling or tenderness. Tympanic membrane is not injected. No decreased hearing is noted.  Nose: Nose normal.  Mouth/Throat: Oropharynx is clear and moist. No oropharyngeal exudate.  Eyes: Conjunctivae and EOM are normal. Pupils are equal, round, and reactive to light. Right eye exhibits no discharge. Left eye exhibits no discharge. No scleral icterus.  Neck: Normal range of motion. Neck supple. No JVD present. No tracheal deviation present. No thyromegaly present.  Cardiovascular: Normal rate, regular rhythm, normal heart sounds and intact distal pulses.  Exam reveals no gallop and no friction rub.   No murmur heard. Pulmonary/Chest: Effort normal and breath sounds normal. No stridor. No respiratory distress. She has no wheezes. She has no rales. She exhibits no tenderness.  Abdominal: Soft. Bowel sounds are normal. She exhibits no distension. There is no tenderness. There is no rebound and no guarding.  Musculoskeletal: Normal range of motion. She exhibits no edema and no tenderness.  Lymphadenopathy:    She has no cervical adenopathy.  Neurological: She is alert and oriented to person, place, and time. She has normal reflexes. She displays normal reflexes. No cranial nerve deficit. She exhibits normal muscle tone. Coordination normal.  Skin: Skin is warm and dry. No rash noted. She is not diaphoretic. No erythema. No pallor.  Psychiatric: She has a normal mood and affect. Her behavior is normal. Judgment and thought content normal.        Lab Results  Component Value Date   WBC 6.2 10/20/2009   HGB 12.0 10/20/2009   HCT 0.37 10/20/2009   PLT 302  10/20/2009   CHOL 139 01/07/2010   TRIG 113 09/24/2006   HDL 39 01/07/2010   ALT 17 09/24/2006   AST 14 09/24/2006   NA 140 05/15/2008   K 4.3 05/15/2008   CL 103 05/15/2008   CREATININE 1.0 05/15/2008   BUN 7 05/15/2008   CO2 31 05/15/2008   INR 1.0 ratio 05/15/2008   HGBA1C 9.1 01/07/2010   MICROALBUR 2.45* 03/15/2007    Assessment & Plan:

## 2010-05-17 ENCOUNTER — Ambulatory Visit (INDEPENDENT_AMBULATORY_CARE_PROVIDER_SITE_OTHER): Payer: Medicare Other | Admitting: Gastroenterology

## 2010-05-17 ENCOUNTER — Encounter: Payer: Self-pay | Admitting: Gastroenterology

## 2010-05-17 DIAGNOSIS — K5900 Constipation, unspecified: Secondary | ICD-10-CM

## 2010-05-17 NOTE — Assessment & Plan Note (Signed)
[facility]                         GASTROENTEROLOGY OFFICE NOTE   NAME:[name], [name]                   MRN:          [id number]  DATE:12/07/2006                            DOB:          04-Nov-1945    PRIMARY CARE PHYSICIAN:  Dr. Ulyess Mort.   GI PROBLEM LIST:  1. Likely diabetic gastroparesis causing nausea, bloating, some      abdominal discomfort.  I do not see any previous gastric emptying      scans to prove this.  2. History of colon polyps.  Last colonoscopy June 2008 by Dr. Victorino Dike found diverticulosis, tortuous colon, no polyps.  He      recommended she have a repeat colonoscopy in 8 years.  I have not      found polyp pathology in her chart.  3. Chronic constipation.  Constipation improving with daily MiraLax.   INTERVAL HISTORY:  I last saw [name] 3 months ago.  Her stools are much  improved.  They are softer, easier to move but she has a sensation of a  blockage when she tries to strain to move her bowels.  She feels like  something is pushing in the way.  She also has to generally push to get  her urine stream moving.  She also admits that often time intercourse  will be interrupted by her having to urinate.   CURRENT MEDICINES:  Metformin, NovoLog, Lantus, Altace, Coreg, Caduet,  furosemide, aspirin, Protonix, Effexor, Spiriva, Nasacort, estrogen,  trazodone, hydroxy________, Symbicort, MiraLax.   PHYSICAL EXAMINATION:  Weight is 227 pounds which is down 6 pounds since  her last visit, blood pressure 130/70, pulse 80.  CONSTITUTIONAL:  Generally well-appearing.  ABDOMEN:  Soft, nontender, nondistended.  No lower extremity edema.  RECTAL:  Examination performed with female CMA in the room, I did not  detect and rectal masses.  On straining there did seem to be a slight  bulge from the anterior side of her rectum.   ASSESSMENT/PLAN:  A 65 year old woman with constipation, question of  bladder prolapse.   Ms.  [name] has a bulging sensation in her rectum when she tries to  move her bowels, she feels like it is blocking her bowel movements.  On  examination I do feel a slight bulge when she Valsalvas.  She also has  some other urinary symptoms of urinating during intercourse as well as  having to strain and Valsalva to urinate effectively.  This seems  suspicious that she may have a  bladder prolapse and so I will arrange for her to be evaluated by  Urology.  She will stay on the MiraLax as it does seem to be helping her  symptoms somewhat.     Rachael Fee, MD  Electronically Signed    DPJ/MedQ  DD: 12/07/2006  DT: 12/07/2006  Job #: [id number]   cc:   C. Ulyess Mort, M.D.

## 2010-05-17 NOTE — Cardiovascular Report (Signed)
[name], [name] NO.:  [id number]   MEDICAL RECORD NO.:  [id number]          PATIENT TYPE:  OIB   LOCATION:  1966                         FACILITY:  [facility]   PHYSICIAN:  [name], MD, FACCDATE OF BIRTH:  Sep 07, 1945   DATE OF PROCEDURE:  05/19/2008  DATE OF DISCHARGE:  05/19/2008                            CARDIAC CATHETERIZATION   PRIMARY CARE PHYSICIAN:  Fleet Contras, MD   PROCEDURE:  Left heart catheterization/coronary angiography.   INDICATIONS:  The patient with chest pain, previous coronary disease  with stenting of the right coronary artery.  A Cardiolite demonstrated  inferior infarct with peri-infarct ischemia and also mild anteroapical  ischemia.   PROCEDURE NOTE:  Left heart catheterization was performed via the right  femoral artery.  The artery was cannulated using the anterior wall  puncture.  A #4 French arterial sheath was inserted via the modified  Seldinger technique.  Preformed Judkins and a pigtail catheter were  utilized.  The patient tolerated the procedure well and left the lab in  stable condition.   HEMODYNAMICS:  LV 154/60, AO 156/86.   CORONARIES:  Left main had 25% calcified plaque.  The LAD had a proximal  25% stenosis, mid 25% focal stenosis followed by a mid long 25%  stenosis.  First diagonal was a small-to-moderate size with ostial 25%  stenosis.  The circumflex in the AV groove was small.  There was mid 30%  stenosis.  Posterolateral was moderate-sized and normal.  The right  coronary artery was a very large dominant vessel.  There was a long  proximal 25% stenosis.  There was a patent proximal stent with diffuse  in-stent 25% stenosis.  There was a mid stent which was widely patent.  PDA was large to moderate and normal throughout its course.  There was a  posterolateral 1 which was moderate-sized with luminal irregularities.  There was a posterolateral 2 which was a long vessel with narrow  caliber.  It had a  mid 80% stenosis.   VENTRICULOGRAM:  Left ventriculogram was obtained in the RAO projection.  The EF was approximately 30-35% with global hypokinesis.   CONCLUSION:  Nonobstructive coronary artery disease.  Global left  ventricular dysfunction.   PLAN:  The patient will continue with aggressive medical management for  her cardiomyopathy.  She is on a reasonable regimen as is.  She will see  Dr. Concepcion Elk for evaluation of nonanginal chest pain.     [name], MD, [facility]  Electronically Signed    JH/MEDQ  D:  05/19/2008  T:  05/20/2008  Job:  [id number]   cc:   Fleet Contras, M.D.

## 2010-05-17 NOTE — Progress Notes (Signed)
HPI: This is a  pleasant 65 year old woman who I not seen in several years.  Has a feeling of having to strain a lot to move her bowels.  Feels very constipated.  Rarely minor rectal blood in stool.  She has gained about 50 pounds in the past 2-3 years.  She feels like she never empties.  She does have a BM just about every day.  She is on dulcolax daily.  She stopped miralax.  She has been on amitiza for the past 3 months, she feels like it is helping.    Looking back at her records here, she has been complaining of constipation for about 10 years. She underwent a colonoscopy by Dr. Marina Goodell in 2003 and is described diverticulosis and hemorrhoids but no polyps. She had another colonoscopy with Dr. Valinda Party June of 2008 and this described only diverticulosis without polyps. She was recommended at that time to have a repeat colonoscopy in 8 years which is an unusual interval to recommend.    Review of systems: Pertinent positive and negative review of systems were noted in the above HPI section.  All other review of systems was otherwise negative.   Past Medical History, Past Surgical History, Family History, Social History, Current Medications, Allergies were all reviewed with the patient via [facility] electronic medical record system.   Physical Exam: BP 142/90  Pulse 84  Ht 5\' 9"  (1.753 m)  Wt 242 lb (109.77 kg)  BMI 35.74 kg/m2 Constitutional: generally well-appearing Psychiatric: alert and oriented x3 Eyes: extraocular movements intact Mouth: oral pharynx moist, no lesions Neck: supple no lymphadenopathy Cardiovascular: heart regular rate and rhythm Lungs: clear to auscultation bilaterally Abdomen: soft, nontender, nondistended, no obvious ascites, no peritoneal signs, normal bowel sounds Extremities: no lower extremity edema bilaterally Skin: no lesions on visible extremities    Assessment and plan: 65 y.o. female with chronic constipation, recent weight gain, last  colonoscopy was less than 4 years ago  She has had problems with her bowels for at least 10 years, chronic constipation. She also mentioned in the office today that a urologist told her a year or 2 ago that she had some type of a communication between her colon and her bladder. She does not remember the name of his urologist and does not remember exactly where he worked. She has no pyruria, no bubbles in her urine, and no solid matter in her urine.  It is not clear to me what his urologic history is about but we will try to track down records when she can provide [place] with the name of her previous urologist. She will resume MiraLax 2 doses every day and will stay off and the teeth at the current dose twice daily and also Dulcolax twice daily. She'll return to see me in 6 weeks and sooner if needed.

## 2010-05-17 NOTE — Assessment & Plan Note (Signed)
[facility]                         GASTROENTEROLOGY OFFICE NOTE   NAME:[name], CRISS BARTLES                   MRN:          [id number]  DATE:04/24/2007                            DOB:          1945/11/15    PRIMARY CARE PHYSICIAN:  Dr. Ulyess Mort.   GI PROBLEM LIST:  1. Likely diabetic gastroparesis causing nausea, bloating, some      abdominal discomfort.  I do not see any previous gastric emptying      scans to prove this.  2. History of colon polyps.  Last colonoscopy June 2008 by Dr. Victorino Dike found diverticulosis, tortuous colon, no polyps.  He      recommended she have a repeat colonoscopy in 8 years.  I have not      found polyp pathology in her chart.  Probable diverticular bleed      February, 2009.  Admitted to the hospital with bright red blood per      rectum, mild anemia, bleeding resolved without intervention. No      need for blood transfusion.  3. Chronic constipation.  Constipation imroving with daily Miralax.   INTERVAL HISTORY:  I last saw Areej 4 weeks ago.  She has been having  intermittent abdominal pain, a feeling of something blocking her  intestines.  This happens whenever she sits down or moves her bowels, or  is standing in the shower.  She feels something is pushing in the way.  I performed a CT scan on her.  She does have a 3 to 4 cm ventral hernia.  She is not taking her daily MiraLax and is still complaining of  constipation.   CURRENT MEDICATIONS:  Metformin, NovoLog, Lantus, Altace, Coreg, Caduet,  Lasix, aspirin, Protonix, Effexor, Spiriva, Nasacort, estrogen,  trazodone, Symbicort, Tylenol, magnesium citrate periodically, aspirin,  Spiriva, and Lasix.   PHYSICAL EXAM:  Weight 221 pounds, which is down 2 pounds since her last  visit.  Blood pressure 130/68, pulse 64.  CONSTITUTIONAL:  Obese, otherwise well-appearing.  ABDOMEN:  Soft, nondistended.  Slightly tender in the left mid quadrant  with  Valsalva.  I do palpate a protruding area in her mid abdomen that  is probably the ventral hernia.  She says this is tender.   PHYSICAL EXAM:  A 65 year old woman with intermittent abdominal pain,  ventral hernia on recent CT scan, suspicious that this ventral hernia is  causing her symptoms.  I will, therefore, send her for a surgical  evaluation to get their opinion and consider hernia repair.     Rachael Fee, MD  Electronically Signed    DPJ/MedQ  DD: 04/24/2007  DT: 04/24/2007  Job #: [id number]   cc:   C. Ulyess Mort, M.D.

## 2010-05-17 NOTE — Assessment & Plan Note (Signed)
[facility]                            CARDIOLOGY OFFICE NOTE   NAME:[name], [name]                   MRN:          [id number]  DATE:06/03/2007                            DOB:          1945/05/02    REFERRING PHYSICIAN:  Cherylynn Ridges, M.D.   PRIMARY:  Dr. Fleet Contras.   REASON FOR CONSULTATION:  Preoperative evaluation in a patient with  cardiomyopathy and coronary artery disease.   HISTORY OF PRESENT ILLNESS:  The patient presents for evaluation prior  to having umbilical hernia repair.  She was due to see me again in  September for routine followup.  Since I last saw her, she had been  doing well.  She has not been getting any chest pressure, neck or arm  discomfort.  She has not been having any palpitation, presyncope or  syncope.  She has been having no PND or orthopnea.  She had gallbladder  surgery last year, and she did well with this.  She does some walking  and works out with a Engineer, structural without getting any symptoms (greater than  5 METS of activity).  Unfortunately, she is back to smoking cigarettes  which she restarts every time she is stressed.   PAST MEDICAL HISTORY:  1. Coronary artery disease (last catheterization 2003 with      nonobstructive disease.  She had previous stenting of the right      coronary artery done in 2002).  2. Cardiomyopathy (EF had been 40% but was more recently 50%.).  3. Diabetes mellitus.  4. Hypertension.  5. Dyslipidemia.  6. Obstructive sleep apnea.  7. GI bleed in 2004 secondary to diverticular disease.  8. Pancreatitis.  9. Depression,  10.Iron-deficiency anemia.   PAST SURGICAL HISTORY:  1. TAH-BSO.  2. Cholecystectomy.   ALLERGIES/INTOLERANCES:  PENICILLIN, CODEINE.   MEDICATIONS:  1. Metformin 5 mg b.i.d.  2. NovoLog.  3. Lantus.  4. Altace 10 mg daily.  5. Coreg 50 mg b.i.d.  6. Caduet 10/20 daily.  7. Furosemide 40 mg b.i.d.  8. Aspirin 81 mg daily.  9. Protonix 40 mg  daily.  10.Effexor 300 mg daily.  11.Spiriva.  12.Nasacort.  13.Estrace.  14.Trazodone.  15.Hydroxyzine.  16.Symbicort.  17.MiraLax.  18.Aspirin 81 mg daily.  19.Spiriva.   SOCIAL HISTORY:  The patient lives in [place].  She has been smoking  a half pack of cigarettes a day for 40 years.  She does not drink  alcohol.   FAMILY HISTORY:  Contributory for her mother dying with hypertension at  age 37.   REVIEW OF SYSTEMS:  As stated in the HPI and positive for abdominal pain  related to the umbilical hernia.  Negative for other systems.   PHYSICAL EXAMINATION:  The patient is in no distress.  Blood pressure 131/80, heart rate 76 and regular.  HEENT:  Eyelids unremarkable.  Pupils equal, round, and reactive to  light.  Fundi not visualized, oral mucosa normal.  NECK:  No jugular distention at 45 degrees, carotid upstroke brisk and  symmetrical.  No bruits, no thyromegaly.  LYMPHATICS:  No adenopathy.  LUNGS:  Clear to auscultation bilaterally.  BACK:  No costovertebral angle tenderness.  CHEST:  Unremarkable.  HEART:  PMI not displaced or sustained, S1-S2 within normal.  No S3, no  S4, no clicks, no rubs, no murmurs.  ABDOMEN:  Obese, positive bowel sounds; normal in frequency pitch, no  bruits, no rebound, no guarding or midline pulsatile mass, no  hepatomegaly, no splenomegaly.  SKIN:  No rashes, no nodules.  EXTREMITIES:  2+ pulses throughout, no edema, cyanosis or clubbing.  NEURO:  Oriented to person, place, time.  Cranial nerves 2-12 grossly  intact, motor grossly intact.   EKG:  Sinus rhythm, rate 75, first-degree AV block, possible old  inferior infarct, poor anterior R wave progression, low voltage in the  precordial leads, no acute ST-T wave changes.   ASSESSMENT/PLAN:  1. Preoperative clearance.  The patient is at acceptable risk for the      planned procedure.  She she has no high-risk features.  She had a      low-risk stress perfusion study in 2007,  unchanged from 2003.  This      is a moderate risk procedure at best.  Therefore, according to      ACC/AHA guidelines, the patient can proceed with surgery without      further cardiovascular testing.  2. Coronary disease as above.  She will continue with risk reduction.  3. Tobacco.  We discussed (greater than 3 minutes) stopping smoking.      For her, I suggest the nicotine gum.  4. Hypertension.  Blood pressure is finally well-controlled, and she      will continue with medications as listed.  5. Diabetes per Dr. Concepcion Elk.  6. Dyslipidemia per her primary care doctor.  The goal will be an LDL      less than 70 and HDL greater than 50.  7. Weight.  We discussed weight loss.  I would like her to concentrate      on stopping smoking if she can only do 1 thing at a time.  However,      she does eventually need to get to a healthier weight, and she      understands this.  8. Followup.  I will see her back in 1 year or sooner if needed.     [name], MD, [facility]  Electronically Signed    JH/MedQ  DD: 06/03/2007  DT: 06/03/2007  Job #: [id number]   cc:   Fleet Contras, M.D.  Cherylynn Ridges, M.D.

## 2010-05-17 NOTE — Discharge Summary (Signed)
[name], [name] NO.:  [id number]   MEDICAL RECORD NO.:  [id number]          PATIENT TYPE:  INP   LOCATION:  5156                         FACILITY:  [facility]   PHYSICIAN:  Mick Sell, MD DATE OF BIRTH:  [birth year]   DATE OF ADMISSION:  02/22/2007  DATE OF DISCHARGE:  02/25/2007                               DISCHARGE SUMMARY   CONSULTATIONS:  1. Gastrointestinal.   DISCHARGE DIAGNOSES:  1. Acute gastrointestinal bleed.  2. Nausea and vomiting.  3. Diabetes.  4. Anemia.  5. Hyperkalemia.   DISCHARGE MEDICATIONS:  1. Metformin 500 mg b.i.d.  2. NovoLog 70/10 units t.i.d. with meals.  3. Lantus 44 units daily.  4. Coreg 25 mg b.i.d.  5. Aspirin 81 mg daily.  6. Protonix 40 mg daily.  7. Effexor 150 mg 2 caps daily.  8. Spiriva 18 mcg inhaled daily.  9. Symbicort 160/0.5 mcg 1 puffs b.i.d.  10.Vicodin 5/500 mg b.i.d.  11.Ferrous sulfate 325 mg t.i.d.  12.Caduet 10 mg/25 mg daily.  13.Albuterol 90 mcg 2 puffs q.i.d. p.r.n.   PROCEDURE:  None.   DISPOSITION  Patient will follow up at the [facility] with Dr. Joni Fears on 03/15/2007 here her  lasix will be increase as needed. Norvasc and altace added as needed.  This were held due to her BP which remain in the 150 systolic.  Here  also a b-met will be obtained to follow up on her K and BUN/Cr.  Also a  CBC to follow up on her Hgb her baseline durin her hospital stay  remained 10.6-11.6. She will follow up with GI for her GI bleeding.   HISTORY OF PRESENT ILLNESS:  [name] is a 65 year old with past  medical history of diverticulosis, status post colonoscopy June 05, 2006  and status post EGD in November 2006; COPD; coronary artery disease  status post MI in 2005 with a catheterization that showed non-  obstructive disease with an ejection fraction of 55%; because of 4  episodes of bright red blood per rectum. She started with dizziness on  Saturday and blood bowel movements the day before. She  relates that it  has progressively gotten worse in amount and frequency. She has had no  episode like this for the past month. She also relates some lower  abdominal pain that is non-radiating, made better with bowel movement  and she also relates some dizziness upon standing.   PHYSICAL EXAMINATION:  VITAL SIGNS:  On the day of discharge was  temperature 98.9, heart rate 89, blood pressure 117/73, respiratory rate  18 per minute, and saturation 99% on room air.   LABORATORY DATA:  On the day of discharge, hemoglobin 9.9. White blood  cell count 9.9. Platelets 305,000. ANC 5.5, MCV 88.3. Sodium 141,  potassium 3.7, chloride 105, bicarb 28, BUN 5, glucose 85. Creatinine  0.9.   HOSPITAL COURSE BY PROBLEM:  1. GASTROINTESTINAL BLEED:  Her etiology was thought to be      diverticular bleed. Her rectal examination showed black stools. So      GI was consulted. They relate that it was probably secondary  to her      diverticulosis, so they recommended medical management with no      intervention at this point. She was observed in the hospital for 2      days and she had no further episodes of GI bleed. On admission, all      her blood pressure medication was stooped and she was started on a      soft diet. She relates that her abdominal pain improved and she had      no further episodes of GI bleed.  2. NAUSEA:  This was thought to be secondary to dizzyness. Patient was      given IV fluids and Zofran and she started eating and the nausea      resolved.  3. DIABETES TYPE 2. Her Lantus was decreased by half. She was put on      sliding scale. Her glucose remained stable throughout her hospital      stay.  4. Anemia, an iron panel was obtained that showed low ferritin of 9,      so ferrous sulfate was started three times a day.  5. HYPOKALEMIA:  A magnesium was checked that was within normal      limits. It was replaced. It was thought to be secondary to her      excessive bowel  movements.   VITAL SIGNS:  On the day of discharge, her vitals were temperature 98,  pulse 96, respiratory rate 18, blood pressure 156/94. She was sating 97%  on room air and her CBG was 192.   DISCHARGE LABORATORY DATA:  Her magnesium was 2. Her sodium was 141,  potassium 3.5, chloride 104, bicarb 26, glucose 156, BUN 2, creatinine  0.6, calcium 8.9. White blood cell count 7.8. Hemoglobin 11.2. Platelets  386,000.      [name], M.D.  Electronically Signed      Mick Sell, MD  Electronically Signed    AF/MEDQ  D:  02/25/2007  T:  02/25/2007  Job:  [id number]

## 2010-05-17 NOTE — Patient Instructions (Addendum)
Change so that you are taking the amitiza WITH food, breakfast and dinner meals.  Stay on twice daily amitiza. Restart the miralax, two doses every morning in water. Stay on dulcolax, two pills a day. Return to see Dr. Christella Hartigan in 6 weeks, sooner if needed. Increase to two cups of coffee in AM. Please get [place] information about your previous urologist, we need to track down the question of a "fistula" that you described today.

## 2010-05-17 NOTE — Assessment & Plan Note (Signed)
[facility]                             PULMONARY OFFICE NOTE   NAME:[name], [name]                   MRN:          [id number]  DATE:05/25/2006                            DOB:          Dec 11, 1945    I saw Ms. [name] today in followup for her COPD and obstructive sleep  apnea.   With regards to her COPD she says her symptoms are reasonably stable.  She is currently on Spiriva one puff daily.  She has been using her  albuterol once or twice a day, particularly before she tries to do any  kind of activities.   With regards to her sleep apnea, again she is on CPAP at 10 cm of water  with a full face mask and heated humidification.  She is doing quite  well with this.  She is using her CPAP on a nightly basis essentially  for the entire night that she is asleep.  She says that her sleep  quality has improved considerably and that her energy level during the  day has improved considerably as well.  She says that she has noticed  such a difference that she has actually started an exercise regimen with  walking on a daily basis.   Her medication list was reviewed and she continues to use her Nasacort.   PHYSICAL EXAMINATION:  She is 229 pounds, temperature is 98.1, blood  pressure is 142/80, heart rate is 86, oxygen saturation 99% on room air.  HEENT:  There is no sign of tenderness, no oral lesions.  HEART:  With S1-S2.  CHEST:  There was no wheezing.  ABDOMEN:  Obese, soft, nontender.  EXTREMITIES:  No edema.   IMPRESSION:  1. Chronic obstructive pulmonary disease.  Since she is having to use      her albuterol inhaler on a rather frequent basis, I will add      Symbicort 160/4.5 two puffs b.i.d. in addition to continuing her      Spiriva.  I have also encouraged her to maintain her smoking      cessation.  2. Obstructive sleep apnea.  Again she is doing quite well on      continuous positive airway pressure at 10 centimeters of water and      I have encouraged her to maintain her compliance with this.  I have      also encouraged her to maintain her compliance with her exercise      regimen.  3. I will follow up with her in approximately 4 months.     [name], MD  Electronically Signed   VS/MedQ  DD: 05/25/2006  DT: 05/25/2006  Job #: [id number]   cc:   C. Ulyess Mort, M.D.  [name], MD, [facility]

## 2010-05-17 NOTE — Assessment & Plan Note (Signed)
[facility]                         GASTROENTEROLOGY OFFICE NOTE   NAME:[name], ABBEE CREMEENS                   MRN:          [id number]  DATE:08/15/2006                            DOB:          January 12, 1945    GI PROBLEM LIST:  1. Likely diabetic gastroparesis causing nausea, bloating, some      abdominal discomfort.  I do not see any previous gastric emptying      scans to prove this.  2. History of colon polyps.  Last colonoscopy June 2008 by Dr. Victorino Dike found diverticulosis, tortuous colon, no polyps.  He      recommended she have a repeat colonoscopy in 8 years.  I have not      found polyp pathology in her chart.  3. Chronic constipation.   INTERVAL HISTORY:  [name] was last seen at time of colonoscopy by  Dr. Victorino Dike about 2 months ago.  He documented a tortuous colon with  diverticulosis.  She tells me she will move her bowels once every 2-3  days usually but in the past 2 weeks she has not really moved  substantially at all.  We called her and we told her to take to some  suppositories and to restart her MiraLax that she was taking in the past  but stopped.  She is here today saying that the enemas produced some  small hard stools but nothing very relieving.   CURRENT MEDICINES:  Metformin, NovoLog, Lantus, Altace, Coreg, Caduet,  Lasix, aspirin, Protonix, Effexor, Spiriva, Nasacort, Estrace,  trazodone, Percocet one to two pills daily.   PHYSICAL EXAMINATION:  Weight 233 pounds, which is up 3 pounds since her  last visit her 4 months ago.  Blood pressure 142/84.  CONSTITUTIONAL:  Obese, otherwise well-appearing.  ABDOMEN:  Soft, nontender, nondistended, normal bowel sounds.   ASSESSMENT AND PLAN:  A 65 year old woman with chronic constipation.   I did not mention above that she says that her chronic left lower  quadrant discomforts are worse when she does not move her bowels and  they are particularly worse now.  She  does not have any peritoneal signs  and I do not think she has any diverticulitis.  I think her biggest  problem is chronic constipation.  She has been recommended to take  MiraLax in the past but does not seem to be doing that on a regular  basis.  I have re-recommended that.  I recommended that she take four  scoops of MiraLax today, four tomorrow, and then back down to two scoops  on a daily basis indefinitely.  She will return to see me in 4-6 weeks  and sooner if needed.     Rachael Fee, MD  Electronically Signed    DPJ/MedQ  DD: 08/15/2006  DT: 08/16/2006  Job #: [id number]   cc:   C. Ulyess Mort, M.D.

## 2010-05-17 NOTE — Assessment & Plan Note (Signed)
[facility]                            CARDIOLOGY OFFICE NOTE   NAME:[name], DANYALE RIDINGER                   MRN:          [id number]  DATE:09/10/2006                            DOB:          April 27, 1945    PRIMARY CARE PHYSICIAN:  C. Ulyess Mort, M.D.   REASON FOR VISIT:  Evaluate the patient for coronary artery disease and  dyspnea.   HISTORY OF PRESENT ILLNESS:  The patient returns for yearly follow-up.  She has had a reduced ejection fraction in the past.  Her EF currently  is about 50%.  She has been followed by gastroenterologist recently for  constipation, diverticulitis, and gastroparesis.  She still has some  bloating and issues with that, but this has improved with therapy.   She has followed by the pulmonologist for COPD as well as sleep apnea  and she is now wearing CPAP.  She says she is more rested.  She does  still get dyspneic with moderate exertion, but does not have any resting  shortness of breath and denies any PND or orthopnea.  She has had no  palpitations, presyncope, or syncope.  She has been off of cigarettes  for a year.   PAST MEDICAL HISTORY:  Coronary artery disease (history of myocardial  infarction treated with stenting of the right coronary artery, residual  coronary artery disease on catheterization in 2005 with a patent right  coronary artery stent, 40% PDA stenosis, 40% posterolateral stenosis,  30% LAD stenosis, 40% diagonal stenosis, 95% distal LAD tandem lesions,  40% left circumflex stenosis).  Ischemic cardiomyopathy (EF had been  35%, it is now approximately 50%), trace mitral regurgitation, diabetes  mellitus, dyslipidemia, previous depression, chronic obstructive  pulmonary disease, laparoscopic cholecystectomy, sleep apnea.   ALLERGIES:  PENICILLIN, CODEINE.   MEDICATIONS:  1. Metformin 500 mg b.i.d.  2. NovoLog.  3. Lantus.  4. Altace 10 mg daily.  5. Coreg 50 mg b.i.d.  6. Caduet 10/20 daily.  7. Furosemide 40 mg b.i.d.  8. Aspirin 81 mg daily.  9. Protonix 40 mg daily.  10.Effexor 300 mg daily.  11.Spiriva.  12.Nasacort two puffs daily.  13.Estrace.  14.Trazodone.  15.Hydroxy 25 mg b.i.d.  16.Symbicort.   REVIEW OF SYSTEMS:  As stated in the HPI, otherwise negative for other  systems.   PHYSICAL EXAMINATION:  GENERAL:  The patient is in no distress.  VITAL SIGNS:  Blood pressure 122/72, heart rate 94 and regular, weight  232 pounds, body mass index 34.  HEENT:  Eyes unremarkable.  Pupils equal, round, and reactive to light.  Fundi not visualized.  Oral mucosa unremarkable.  NECK:  No jugular venous distention to 45 degrees, carotid upstroke  brisk and symmetric.  No bruits, no thyromegaly.  Lymphatics; no  adenopathy.  LUNGS:  Clear to auscultation bilaterally.  BACK:  No costovertebral angle tenderness.  CHEST:  Unremarkable.  HEART:  PMI not displaced or sustained.  S1 and S2 within normal limits.  No S3, no S4, no clicks, no rubs, and no murmurs.  ABDOMEN:  Obese.  Positive bowel sounds.  Normal in frequency and pitch.  No bruits.  No rebound, guarding, or midline pulsatile mass.  No  hepatomegaly and no splenomegaly.  SKIN:  No rashes.  No nodules.  EXTREMITIES:  2+ pulses, no edema.   EKG; sinus rhythm, rate 94, axis within normal limits.  Old inferior  infarct, QTC slightly prolonged, first degree heart block, nonspecific T  wave flattening.   ASSESSMENT:  1. Coronary artery disease.  The patient is having no symptoms related      to this.  No further cardiovascular testing is suggested.  She will      continue with risk reduction.  2. Hypertension.  Blood pressure is well controlled and she will      continue the medications as listed.  3. Dyslipidemia.  When she goes to see Dr. Aundria Rud, she is going to get      a lipid profile and liver enzymes.  I would be happy to review      these.  Her goals in LDL less than 70, HDL in the 50's.  4. Prolonged QT.   She has no symptoms related to this.  This should      just be kept in mind when she is given medications and QT      prolonging drugs should be avoided.  5. Obesity.  We had a long discussion about the need to lose weight      and I prescribed the [employer] and a more regular exercise      regimen.  6. Heart failure.  This is well treated and she will continue on the      medications as listed with control of her hypertension being the #1      issue.   FOLLOWUP:  I will see her back in one year or sooner if needed.     [name], MD, [facility]  Electronically Signed   JH/MedQ  DD: 09/10/2006  DT: 09/10/2006  Job #: [id number]   cc:   C. Ulyess Mort, M.D.

## 2010-05-17 NOTE — Assessment & Plan Note (Signed)
[facility]                         GASTROENTEROLOGY OFFICE NOTE   NAME:[name], [name]                   MRN:          [id number]  DATE:09/14/2006                            DOB:          [birth year]    PRIMARY CARE PHYSICIAN:  Dr. Ulyess Mort.   GI PROBLEM LIST:  1. Likely diabetic gastroparesis causing nausea, bloating, some      abdominal discomfort.  I do not see any previous gastric emptying      scans to prove this.  2. History of colon polyps.  Last colonoscopy June 2008 by Dr. Victorino Dike found diverticulosis, tortuous colon, no polyps.  He      recommended she have a repeat colonoscopy in 8 years.  I have not      found polyp pathology in her chart.  3. Chronic constipation.  Constipation improving with daily MiraLax.   INTERVAL HISTORY:  I last saw Ms. [name] 1 month ago.  At that time I  reiterated the need for her to take MiraLax on a daily basis.  She has  been doing that and has noticed a definite improvement in her bowel  habits.  She will have a very healthy, generous bowel movement once  daily at least, and sometimes twice daily.  She does still have some  left lower quadrant discomfort that is often removed when she moves her  bowels.  She is, today, complaining of some discomfort at her anus, and  feeling of a full feeling, especially when she is sitting.   CURRENT MEDICATIONS:  1. Metformin.  2. NovoLog.  3. Lantus.  4. Altace.  5. Coreg.  6. Caduet.  7. Lasix.  8. Aspirin.  9. Protonix.  10.Effexor.  11.Spiriva.  12.Nasacort.  13.Estrace.  14.Trazodone.  15.Percocet 1-2 pills daily.  16.MiraLax.   PHYSICAL EXAMINATION:  Weight 233 pounds which is stable over the last  month.  Blood pressure 130/72, pulse 84.  CONSTITUTIONAL:  Obese, otherwise well appearing.  ABDOMEN:  Soft, nontender, nondistended, normal bowel sounds.  RECTAL EXAMINATION:  Done with female CMA in room showed some minor  hemorrhoidal tissue externally.  Internal exam was normal, no masses.   ASSESSMENT AND PLAN:  A 65 year old woman with chronic constipation,  anal fullness.   These minor external hemorrhoids intermittently will swell.  I have  given her samples of Analpram and told her to apply that as needed.  She  will stay on MiraLax and she will return to see me in 3 months' time and  sooner if needed.     Rachael Fee, MD  Electronically Signed    DPJ/MedQ  DD: 09/14/2006  DT: 09/15/2006  Job #: [id number]   cc:   C. Ulyess Mort, M.D.

## 2010-05-17 NOTE — Op Note (Signed)
NAMEINITA, URAM            ACCOUNT NO.:  [id number]   MEDICAL RECORD NO.:  [id number]          PATIENT TYPE:  OIB   LOCATION:  4713                         FACILITY:  [facility]   PHYSICIAN:  Cherylynn Ridges, M.D.    DATE OF BIRTH:  1945-09-11   DATE OF PROCEDURE:  DATE OF DISCHARGE:                               OPERATIVE REPORT   PREOPERATIVE DIAGNOSIS:  Supraumbilical ventral hernia.   POSTOPERATIVE DIAGNOSIS:  Supraumbilical ventral hernia, 6 cm defect.   PROCEDURE:  Supraumbilical ventral hernia defect repair with  polypropylene mesh.   SURGEON:  Cherylynn Ridges, MD   ANESTHESIA:  General endotracheal.   ESTIMATED BLOOD LOSS:  Less than 20 mL.   COMPLICATIONS:  None.   CONDITION:  Stable.   FINDINGS:  The patient had a 6 cm supraumbilical fascial defect just  above the site of her previous lower abdominal incision above the  umbilicus, but not associated with any continuing defect of the previous  abdominal wall repair.  She also has a diastasis recti.   INDICATIONS FOR OPERATION:  The patient is a 65 year old with a  supraumbilical ventral hernia who comes in for repair.   OPERATION:  The patient was taken to the operating room, placed on table  in supine position.  After an adequate general endotracheal anesthetic  was administered, she was prepped and draped in usual sterile manner  exposing the midline.   We started up high in the midline incision because as the patient was  going to sleep she Valsalva'd and the diastasis recti became more  prominent.  It gave question to the fact that the patient may have had a  larger hernia than thought previously.  We extended it down to the  umbilicus and then we dissected down to the fascia where we found that  the defect was only in the supraumbilical area, measured about 6-7 cm  long.  We dissected out the hernia and a sac circumferentially.  We  dissected away the subcutaneous tissue from the fascia  circumferentially,  so that we had adequate wound in order to place some  mesh.  As we identified the sac, we opened the sac at the fascial edge  using electrocautery and found there to be omentum contained within,  which we followed back into the peritoneal cavity with minimal  difficulty.   As we pulled up on the fascia at the corners superiorly and inferiorly  using Kocher clamps, we repaired the defect using interrupted #1 Novafil  sutures.  We then placed a piece of oval mesh measuring approximately 5  x 3 cm in size on top of the defect secured and in place with  interrupted this with #1 Novafil.  We tapered and cut the mesh for  excess mesh to be left in the wound and placed some figure-of-eight  stitches across the mesh underneath the fascia across the midline wound.  About 3 of these were placed.  Once this was done, we did a running 0  Prolene suture from the top to the bottom and then back up to the top,  securing the mesh  in place.   Antibiotic solution was used to irrigate the mesh and soaked it prior to  implantation.  Once that was implanted and sutured in place adequately,  we reapproximated the deep subcutaneous tissue with 3 Vicryl, more  superficially we reapproximated the subcu fat with 3-0 Vicryl, then the  skin was closed using running subcuticular stitch of 4-0 Monocryl.  We  injected 0.5% Marcaine without epi into the wound.  Dermabond, Steri-  Strips, and Tegaderms were applied.  All needle counts, sponge counts,  and instrument counts were correct.      Cherylynn Ridges, M.D.  Electronically Signed     JOW/MEDQ  D:  08/23/2007  T:  08/24/2007  Job:  [id number]   cc:   C. Ulyess Mort, M.D.  Ulyess Mort, MD

## 2010-05-17 NOTE — Assessment & Plan Note (Signed)
[facility]                         GASTROENTEROLOGY OFFICE NOTE   NAME:[name], [name] KARG                   MRN:          [id number]  DATE:03/22/2007                            DOB:          [birth year]    PRIMARY CARE PHYSICIAN:  C. Ulyess Mort, M.D.   GASTROINTESTINAL PROBLEM LIST:  1. Likely diabetic gastroparesis causing nausea, bloating, some      abdominal discomfort.  I do not see any previous gastric emptying      scans to prove this.  2. History of colon polyps.  Last colonoscopy June 2008 by Dr. Victorino Dike found diverticulosis, tortuous colon, no polyps.  He      recommended she have a repeat colonoscopy in 8 years.  I have not      found polyp pathology in her chart.  Probable diverticular bleed      February, 2009.  Admitted to the hospital with bright red blood per      rectum, mild anemia, bleeding resolved without intervention. No      need for blood transfusion.  3. Chronic constipation.  Constipation imroving with daily Miralax.   INTERVAL HISTORY:  I last saw [name] three months ago.  At that time she  was mainly complaining of constipation and there was a question raised  of a bladder prolapse due to some symptoms she has, feeling that  something is moving and blocking her intestines somewhere when she  either sits down to move her bowels or is standing in the shower.  She  feels something is pushing in the way.  I did feel a slight prolapse on  Valsalva during rectal examination.  I arranged for her to see a  urologist which she did do.  This urologist did not feel that she had  any significant bladder prolapse causing her problems.  Today she comes  in for followup for her recent GI bleed.  From that perspective, she is  doing very well.  She has had no bleeding since then.  Her admission  hemoglobin was 10, her discharge hemoglobin was 11.  I do not believe  she received any blood during that time period.  She is  still very  frustrated by the symptoms she has of something feeling like it is  blocking her intestine when she stands or when she Valsalva's.  Nothing  prolapses from her bottom, she admits, however.   CURRENT MEDICATIONS:  Metformin, NovoLog, Lantus, Altace, Coreg, Caduet,  Lasix, aspirin, Protonix, Effexor, Spiriva, Nasacort, Estrace,  Trazodone, hydroxyzine, Symbicort, MiraLax, Vicodin.   PHYSICAL EXAMINATION:  VITAL SIGNS:  Weight 223 pounds, which is down 4  pounds since her last visit.  Blood pressure 130/70.  Pulse 80.  CONSTITUTIONAL:  Generally well-appearing.  ABDOMEN:  Soft, nontender, nondistended, normal bowel sounds.  EXTREMITIES:  No lower extremity edema.   ASSESSMENT/PLAN:  This is a 65 year old woman with recent  gastrointestinal bleed, resolved, thought to be diverticular in nature,  continued unusual lower gastrointestinal symptoms.   She has had this feeling of something blocking her intestines for at  least 10 years,  she admits.  She is very frustrated by it, however, work  up to date has never revealed any true etiology.  She did, of note, have  a colonoscopy just less than one year ago with diverticulosis and a  tortuous colon seen but certainly no obstructing lesions or strictures.  I suspect perhaps she has intermittent small bowel compromise and so I  will arrange for her to have a small bowel follow through at her soonest  convenience.  She has been seen by a urologist for this who does not  believe she has any bladder prolapse and she is status post hysterectomy  so she would not be having any uterine prolapse, either.  I think if the  small bowel follow through is not helpful then I would arrange for her  to have an abdomen and pelvis CT scan  just to make sure we are not  missing anything significant.     Rachael Fee, MD  Electronically Signed    DPJ/MedQ  DD: 03/22/2007  DT: 03/22/2007  Job #: [id number]   cc:   C. Ulyess Mort, M.D.

## 2010-05-20 NOTE — Discharge Summary (Signed)
[name], [name] NO.:  [id number]   MEDICAL RECORD NO.:  [id number]          PATIENT TYPE:  INP   LOCATION:  3729                         FACILITY:  [facility]   PHYSICIAN:  Dorian Pod, NP    DATE OF BIRTH:  20-Dec-1945   DATE OF ADMISSION:  11/27/2004  DATE OF DISCHARGE:  12/02/2004                                 DISCHARGE SUMMARY   PRIMARY CARDIOLOGIST:  [name], M.D.   PRIMARY CARE PHYSICIAN:  Colen Darling. Aundria Rud, M.D.   SURGEON THIS ADMISSION:  [name], MD.   DISCHARGING PHYSICIAN:  Olga Millers, M.D. [facility]   DISCHARGE DIAGNOSES:  1.  Status post cholecystectomy.  2.  History of coronary artery disease.  3.  Diabetes.  4.  Hypertension.  5.  Hypercholesterolemia.  6.  Anxiety/depression.  7.  Obstructive sleep apnea.   PAST MEDICAL HISTORY:  1.  Congestive heart failure.  2.  Coronary artery disease.  3.  Hyperlipidemia.  4.  Type 2 diabetes.  5.  Obesity.  6.  Tobacco use.  7.  GERD.  8.  Anxiety/depression.  9.  Insomnia.  10. History of GI bleed.  11. Inferior Q-wave MI.  12. Status post RCA stent and Angio jet by Dr. Gerri Spore in July 2002.   HOSPITAL COURSE:  [name] is a 65 year old African American female  with known history of coronary artery disease who presented on the day of  admission complaining of right-sided chest pain/right upper quadrant  abdominal pain. The patient was admitted to rule out myocardial infarction  and treated as such until proven otherwise. Troponin was 0.01.  The patient  continued to have right upper quadrant pain. Chest pain and shortness of  breath resolved. Ultrasound showed gallstones, but no cholecystitis. GI was  consulted and saw the patient on November 29, 2004. The patient continued to  complain of discomfort status post endoscopy on November 30, 2004, showing  gastritis and a hiatal hernia. On November 30, 2004, surgery was asked to  consult secondary to cholelithiasis.  Dr. Luan Pulling saw the patient. The  patient was symptomatic cholelithiasis. Plan was for cholecystectomy. The  patient was taken to the OR on December 01, 2004, for laparoscopic  cholecystectomy. The patient tolerated the procedure without complications  per surgery note. On postoperative day one the patient was afebrile, no  further complaints of discomfort. Dr. Jens Som in to see the patient.  Patient being discharged home to follow up with [name].   LABORATORY WORK AT DISCHARGE:  BMET revealed sodium of 133, potassium 3.6,  chloride 95, CO2 26, glucose 275, BUN 11, creatinine 1.2. White blood cell  count 13.2, hemoglobin 12.1, hematocrit 35.2 with a platelet count of  228,000. Lipase on November 30, 2004, was 20 with amylase of 20. At  discharge, the patient received surgical instructions to remove a bandage on  December 23, 2004, and the patient may shower then.   DISCHARGE MEDICATIONS:  Medications include Norvasc 10 mg daily, Effexor 150  mg b.i.d., Protonix 40 mg daily, Coreg 50 mg b.i.d., Altace 10 mg daily,  aspirin 325 mg daily, Lasix  80 mg in the a.m. and 40 mg in the p.m., Lipitor  20 mg daily, Desyrel 150 mg at bedtime, Colace as needed, insulin 70/30 38  units b.i.d. or as previously ordered.   FOLLOWUP APPOINTMENT:  Dr. Luan Pulling in two to three weeks. The patient has  a phone number to call Dr. Jenene Slicker PA on December 15th at 10:30.   DURATION OF DISCHARGE:  30 minutes.      Dorian Pod, NP     MB/MEDQ  D:  12/02/2004  T:  12/02/2004  Job:  [id number]   cc:   [name], M.D.  [address]  [address]  [place]  [place] [address]   [name], MD  [phone]  [place]  [place] [address]

## 2010-05-20 NOTE — Discharge Summary (Signed)
[place]  Patient:    [name], [name] Visit Number: [id number] MRN: [id number]          Service Type: MED Location: [phone] Attending Physician:  Edwyna Perfect Dictated by:   Salley Slaughter, MS4 Admit Date:  03/17/2001 Discharge Date: 03/20/2001                             Discharge Summary  DISCHARGE DIAGNOSES:  1. Lower gastrointestinal bleed secondary to external/internal hemorrhoids     versus diverticulosis.  2. Chronic constipation (followed by Ulyess Mort, M.D. [facility]).  3. Congestive heart failure - left ventricular ejection fraction equal to     30% (July 2002) to 50% (November 2002).  4. Hypertension.  5. Hyperlipidemia.  6. 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  7. Diabetes mellitus - insulin-requiring, paresthesias of the fingers and     toes.  8. Chronic obstructive pulmonary disease - pulmonary function tests (Dr.     Sung Amabile) - moderate obstructive defect, moderate restriction.  9. Status post hysterectomy for fibroids. 10. History of microcytic anemia.  DISCHARGE MEDICATIONS:  1. Coreg 50 mg p.o. b.i.d.  2. Advair 500/50 one puff b.i.d.  3. Lasix 40 mg p.o. q.a.m. and 20 mg p.o. q.p.m.  4. Insulin 70/30 35 units b.i.d.  5. Ramipril 10 mg p.o. b.i.d.  6. Zocor 40 mg p.o. q.d.  7. Trazodone 150 mg p.o. q.h.s.  8. Venlafaxine 150 mg p.o. q.h.s. and 75 mg p.o. q.d.  9. Albuterol two puffs q.4h. p.r.n. 10. Mirulax 17 grams in 8 ounce water - one time q.d. p.r.n. constipation. 11. Aspirin 325 mg p.o.  q.d. 12. Protonix 40 mg p.o. q.d.  CONSULTING PHYSICIANS:  Judie Petit T. Pleas Koch., M.D. [facility], Ulyess Mort, M.D. [facility].  PRIMARY CARE PHYSICIAN:  C. Ulyess Mort, M.D.  PROCEDURE:  Colonoscopy on March 19, 2001: found internal hemorrhoids, diverticulosis from the ascending colon to the sigmoid colon.  No active bleeding.  HISTORY OF PRESENT ILLNESS:  The patient is a 65 year old African-American female with a past medical history significant for hemorrhoids, diverticulosis, chronic constipation, CHF (history of MI), hypertension, hyperlipidemia, insulin-dependent diabetes mellitus, COPD, who presents with an acute onset of dark stools and bright red blood per rectum.  The onset was in the morning of March 17, 2001, and encompassed five bloody/dark stools plus clots.  She experienced lower abdominal cramping with no tenderness.  Prior to the event, she was constipated for three to four days and had been taking enemas every other day for two months and magnesium citrate for the last two days.  The patient denies wretching, vomiting, other sites of bleeding (no epistaxis, no recent gingival), no diarrhea, no syncope.  NSAID use is limited to aspirin at 325 mg q.d. beginning July of 2002.  The patient has been using alcohol since her teens.  She quit approximately two years ago.  She describes her drinking as binging during the week and  weekends.  She has no reported prior history of a GI bleed (the clinic notes state a history of a GI bleed in 2001).  In the emergency department, the patient was given 1.5 cc of normal saline.  PHYSICAL EXAMINATION:  VITAL SIGNS: Blood pressure 130/84, pulse 80, orthostatic blood pressures were note done.  She was afebrile.  Respiratory rate 18.  O2 saturations 96% on room air.  GENERAL: She is lying comfortably. NAD. HEENT: PERRLA, EOMI, no scleral icteris, positive pale conjunctivae. HEART: No JVD, regular rate and rhythm with no murmurs and  normal S1 and S2. LUNGS: Some crackles at the bases bilaterally.  No wheezes and no rhonchi. ABDOMEN: Positive bowel sounds, no tenderness, no rigidity, no guarding. RECTAL: Normal tone, no stool, and a small sample of stool was hemoccult negative.  SKIN: No spider angiomata and no palmar erythema.  EXTREMITIES: Normal strength and tone with no clubbing, cyanosis, or edema.  NEUROLOGICAL: She was alert and oriented x 3, very conversant, and no focal deficits were found.  LABORATORY DATA:  Amylase 29, lipase 11, PT 13.6, INR 1.3, PTT 25.  WBC 9.4, hemoglobin 9.1, platelets 286, MCV 78.3.  Sodium 137, potassium 3.8, chloride 101, bicarbonate 31, BUN 8, creatinine 1.0, glucose 125.  Total bilirubin 0.4, alkaline phosphatase 120, AST 15, ALT 15, albumin 3.0, calcium 8.3.  CK-MB 1.6, troponin 0.1.  UA demonstrated a specific gravity of 1.024, pH 5.5, negative glucose, small bilirubin, positive ketones, positive nitrites, positive leukocyte esterase, 3 to 6 WBCs per high powered field.  No chest x-ray done.  Her EKG demonstrated a normal rate, normal sinus rhythm with no acute ST T changes.  Her QT interval may be prolonged.  HOSPITAL COURSE:  #1 - GI bleed - probably lower GI bleed given the presentation and it may be secondary to right-sided diverticula plus or minor internal/external hemorrhoidal bleed.  The bleeding may occur from trauma inflicted by hard stool. Two large bore IVs were placed and the patient received 1.5 liters of normal saline in the emergency room.  The normal saline was continued at 125 cc per hour.  Lasix was held.  The patients Coreg/Ramipril was given at half dose.  The patient demonstrated no decreases in blood pressure and was no orthostatic during admission.  Her hemoglobin on admission was 9.1 (baseline equals 8.3 to 9.9).  The patient was transfused two units of packed red blood cells which increased her hemoglobin to 9.8.  The goal was to keep her hemoglobin  around 10 because she has a history of myocardial infarction and she has CHF.  The patient was evaluated by colonoscopy on March 19, 2001.  Evaluation demonstrated right-sided diverticula and internal hemorrhoids with no sites of active bleeding.  The patient had no recurrent bleed during admission and her hemoglobin equaled 9.4 on discharge.  The patient needs outpatient follow-up of her hemoglobin.  #2 - CHF - stable throughout admission.  Initially, Lasix was held. Ramipril/Coreg was half dosed.  Post transfusion, the patient had some vague symptoms of shortness of breath and one dose of Lasix at 40 mg p.o. q.d. was given.  Her symptoms resolved.  Prior to discharge, Lasix at 40 mg p.o. q.a.m. and 20 mg p.o. q.p.m., Ramipril 10 mg p.o. b.i.d., Coreg 50 mg p.o. b.i.d. was restarted.  The patient was euvolemic, breathing comfortably, and O2 saturations were 96% on room air at the time of discharge.  #3 - Chronic constipation.  The patient had much difficulty passing stools.  No evaluation was done as an inpatient, but the patient was counseled on increasing her fiber intake.  This issue needs to be continually addressed as an outpatient and with Dr. Corinda Gubler.  The patient was discharged with Mirulax 17 grams in 8 ounces of water as a trial regimen.  #4 - Diabetes mellitus.  The patient was counseled on following her capillary blood glucose at home.  Since the patient is NPO for procedures, she was managed with sliding scale insulin.  No acute issues.  #5 - COPD.  The patient was a little short of breath with fluids and PRBCs. Her symptoms were managed with the one dose of Lasix at 40 mg p.o., Advair at 500/50 one puff b.i.d., and albuterol inhaler.  #6 - Microcytic anemia.  Studies done as an outpatient in 2000-12-06; hemoglobin 8.3 and MCV 78.  Further studies demonstrated iron 42, TIBC 318, percent sat 13, reticulocyte index 0.7, no occult blood, ferritin 57, and normal  folate.  Iron deficiency versus anemia of chronic disease was suggested.  Also diverticular may lead to transient occult blood loss which will contribute to anemia.  Will follow hemoglobins as an outpatient.  #7 - Prevention of MI.  Hypertension and hyperlipidemia  - no acute issues during hospitalization.  Her hypertensive medicines were restarted once bleeding was stabilized.  Zocor 40 mg q.d. was continued.  Aspirin was stopped during hospitalization until her bleeding was stabilized.  The patient was encouraged to continue aspirin as an outpatient.  #8 - UTI.  UA demonstrated positive nitrites, positive leukocyte esterase, and 3 to 6 white blood cells per high powered field.  The patient noted pressure and urgency with urination.  She was treated with Cipro 250 mg p.o. b.i.d. x3 days.  Her treatment was completed at the time of discharge.  FOLLOW-UP:  With Ulyess Mort, M.D. [facility] on April 05, 2001.  The patient already has an appointment.  Follow up with C. Ulyess Mort, M.D. on May 15, 2001, at that time her hemoglobin needs to be checked. Dictated by:   Salley Slaughter, MS4 Attending Physician:  Edwyna Perfect DD:  03/20/01 TD:  03/21/01 Job: [id number]

## 2010-05-20 NOTE — Discharge Summary (Signed)
[place]  Patient:    [name], [name]                     MRN: [id number] Adm. Date:  07/09/00 Disc. Date: 07/16/00 Attending:  [name], M.D. [facility] Dictator:   Gene Serpe, P.A. CC:         [facility], c/o Dr. Aundria Rud   Discharge Summary  PROCEDURES: 1. Coronary angiogram/stent right coronary artery July 9. 2. A 2-D echocardiogram.  REASON FOR ADMISSION:  The patient is a 65 year old female with now a previous history of coronary artery disease but with multiple cardiac risk factors notable for treated hyperlipidemia, hypertension, and insulin-requiring diabetes who presented with a six-month history of chronic angina pectoris culminating in worsening symptoms.  On presentation to the emergency room, she was found to have an elevated MB and troponin and was admitted for further management and diagnostic evaluation.  LABORATORY DATA:  Cardiac enzymes:  Peak CPK 2122/434; peak troponin-I 25. Liver profile:  Total cholesterol 132, triglycerides 117, HDL 42, cholesterol/HDL ratio 3.1, LDL 67.  CBC on admission:  WBC 10.7, hemoglobin 10.7, hematocrit 31.8, platelets 319, MCV of 74.  Occult blood:  Heme negative.  BMP on admission:  Sodium 136, potassium 3.1, glucose 182, BUN 7, creatinine 0.8.  Final potassium 4.0.  Renal function remained normal throughout.  Mildly elevated AST 127 and ALP 151.  Magnesium 2.2.  Iron profile:  Normal iron 84, normal TIBC 329, normal percent saturation 26. Urine culture:  No growth.  Admission chest x-ray:  Mild bibasilar atelectasis.  Chest x-ray July 12: Acute CHF.  Followup chest x-ray July 13:  Decreased CHF.  HOSPITAL COURSE:  The patient was placed on intravenous nitroglycerin and heparin, aspirin, and beta-blocker with plans to proceed with diagnostic coronary angiogram.  Cardiac enzymes were at their highest on admission:  Peak CPK 2122/______ (with troponin-I peaking two days  later at a level of 25). It was felt that the patient was approximately 30 hours out from her acute inferior myocardial infarction.  Q waves were noted inferiorly.  The patient was also noted to have microcytic anemia.  Occult blood was heme negative.  Hemoglobin did remain stable at around approximately 10.  She reported no recent history of bleeding.  Records were obtained from outpatient clinic, where she is a patient of Dr. Aundria Rud, for a comparison.  Her most recent CBC November 2000 revealed a hemoglobin of 12.8, hematocrit 37.8, with platelets of 315 and normal MCV of 89.  The patient did have remote history of a polypectomy.  Iron profile here in the hospital was entirely within normal limits.  Her hemoglobin will need to be followed as an outpatient.  Of note, she has seen Dr. Victorino Dike in the past.  The patient underwent elective coronary angiogram on July 9, by Dr. Gerri Spore (see catheterization report for full details), revealing essentially severe single-vessel CAD with moderate LV dysfunction (EF 30%) and 2+ MR.  Dr. Gerri Spore proceeded with stenting a 99% proximal RCA and a 90% distal RCA lesion and PTCA dilating a 60% mid-RCA lesion.  The patient was placed on 24-hour infusion of Integrilin and plans to proceed with Plavix x 4 weeks.  Residual coronary anatomy notable for 30% proximal LAD and 30% mid CFX.  Perioperative course notable for development of acute congestive heart failure treated with intravenous Lasix and significant improvement.  She was subsequently placed on maintenance dose of Lasix.  A final  chest x-ray showed improvement of the CHF.  Enzymes were also performed during the acute dyspneic event.  These continued to trend downward.  Additionally, a 12-lead electrocardiogram revealed no change from previous studies.  The patient was also placed on intravenous nitroglycerin for a preload reduction.  A 2-D echocardiogram was performed on July 9 with  results unavailable at time of dictation.  The patient was finally cleared for discharge on hospital day #6.  On the eve of discharge, she had reported some right-sided jaw/tooth pain and was placed on Keflex.  On the morning of discharge, she stated that the pain had resolved.  The plan is to continue Keflex x 1 week and proceed with outpatient evaluation if she has any recurrent discomfort.  Regarding medication adjustments, the patient had been on Accupril prior to this admission; however, this was changed to Altace by Dr. Gerri Spore.  DISCHARGE MEDICATIONS:  1. Keflex 500 mg b.i.d. (x 1 week).  2. Coreg 3.125 mg b.i.d.  3. Coated aspirin 325 mg q.d.  4. Plavix 75 mg q.d. (x 3 weeks).  5. Lipitor 20 mg q.d.  6. Altace 5 mg b.i.d.  7. Lasix 40 mg q.d.  8. K-Dur 20 mEq q.d.  9. Prevacid 30 mg q.d. 10. Effexor 150 mg b.i.d. 11. Desyrel 150 mg q.h.s. 12. Insulin as previously directed. 13. Nitrostat as directed.  DISCHARGE INSTRUCTIONS:  Return to baseline level of activity as tolerated. The patient is to refrain from returning to work or driving until seen by her physician.  She is to maintain a low-fat/cholesterol diet.  She is to refrain from using any added salt in her diet.  FOLLOW-UP:  The patient will follow up with Dr. Fayrene Fearing Hochrein/P.A. clinic on Friday, July 26, at 3 p.m.  She will need a followup BMP and CBC at that time. The patient will follow up with Dr. [name] on Tuesday, August 20, at 9:30 a.m.  The patient is instructed to schedule a followup appointment with his primary care physician in one week.  DISCHARGE DIAGNOSES:  1. Status post out-of-hospital inferior myocardial infarction.     a. Status post stent (x 2) and percutaneous transluminal coronary        angioplasty of right coronary artery July 10, 2000.     b. Residual 30% left anterior descending; 30% circumflex.     c. Moderate left ventricular dysfunction (ejection fraction 30%).  2. Status  post acute congestive heart failure.     a. Post intervention.   3. Anemia.     a. Microcytic; heme negative; normal iron profile.  4. Diabetes mellitus--insulin requiring.  5. Dyslipidemia.  6. History of hypertension.  7. Jaw/toothache.  8. History of depression.  9. Tobacco. DD:  07/16/00 TD:  07/16/00 Job: [id number]

## 2010-05-20 NOTE — Discharge Summary (Signed)
[facility]  Patient:    [name], [name] Visit Number: [id number] MRN: [id number]          Service Type: MED Location: [id number] [phone] Attending Physician:  [name] Dictated by:   Kerrie Pleasure, M.D. Admit Date:  11/09/2000 Disc. Date: 09/07/00                             Discharge Summary  DISCHARGE DIAGNOSES:  1. Chest pain with residual one-vessel coronary artery disease.  2. Diabetes.  3. Hyperlipidemia.  4. Hypertension.  5. Tobacco abuse.  6. Depression.  7. Anemia.  8. Congestive heart failure.  PROCEDURES: Cardiac catheterization by Dr. Gerri Spore on September 07, 2000.  CONSULTATION: Cardiology.  HISTORY OF PRESENT ILLNESS: This is a 65 year old African-American female, with multiple medical problems, including coronary artery disease, history of MI, status post PTCA and stent in July 2002, CHF with an EF of 30%, microcytic anemia, insulin-dependent diabetes mellitus, hypertension, tobacco abuse, and dyslipidemia.  She presented with a history of chest pain the day prior to presentation which was midsternal in nature and then left-sided, described as more of a tightness, intermittent, that was present constantly.  It was not associated with shortness of breath or diaphoresis.  No vomiting, but it was exacerbated by exertion and nothing alleviated it, not even NTG.  Onset was said to have awakened her with coughing.  She has had similar symptoms in the past when she had an MI in July 2002.  There was also associated orthopnea and nonproductive cough two days prior to presentation.  PHYSICAL EXAMINATION:  VITAL SIGNS: On examination she was found to have a temperature of 99.8 degrees, blood pressure 167/97, heart rate 123, respiratory rate 30, and O2 saturation 84% on room air.  HEENT: Muddy sclerae.  Oropharynx clear.  NECK: Supple.  CHEST: Bibasilar crackles.  HEART; Tachycardic rate, normal heart sounds, no  murmur.  ABDOMEN: Essentially normal.  EXTREMITIES: No edema.  NEUROLOGIC: Nonfocal.  ADMISSION LABORATORY DATA: WBC 9.6, hemoglobin 10.7, platelets 311,000. Sodium 140, potassium 3.6, chloride 103, CO2 31, BUN 8, creatinine 0.8, glucose 71.  CK 124, MB 3.5.  Troponin 0.05.  EKG showed sinus tachycardia at 102, T wave depression at V6, with inferior leads, poor R wave progression; no acute changes compared to previous EKG. Chest x-ray shows evidence of CHF with edema.  Chest CT ruled out PE.  CT of legs also ruled out DVT.  However, chest CT showed diffuse bilateral infiltrate versus edema with right paratracheal and right hilar more than left hilar adenopathy; questionable sarcoid, atypical pulmonary edema on CT.  HOSPITAL COURSE: PROBLEM #1 - CHEST PAIN: The patients main presentation was chest pain and she was admitted for evaluation of chest pain, particularly noting her previous history.  PE was ruled out out by CT as well as lower extremity Dopplers negative for CT as well as DVT respectively.  As such, the patient was ruled out for MI by three series of enzymes which returned normal. Pneumonia was also suspected and she was treated empirically with antibiotics. Sarcoidosis was another factor, although there was no clear evidence, but for the chest pain she was placed on telemetry and cardiology was consulted to assist with her management.  PROBLEM #2 - CONGESTIVE HEART FAILURE: The patient was diuresed.  She was given Lasix for her CHF at 80 mg IV b.i.d.  She was also placed on her home meds,  which included Altace, Coreg, and digoxin for her CHF.  PROBLEM #3 - HYPERLIPIDEMIA: The patient was placed on Lipitor, which has been her home med, 20 mg p.o. q.d. for hyperlipidemia.  PROBLEM #4 - HYPERTENSION: The patients blood pressure was noted to be not elevated.  However, blood pressure was elevated at 167/97.  As such, she was placed on her home meds, which included Altace,  Coreg, and Lasix.  Her blood pressure did well prior to discharge.  PROBLEM #5 - DEPRESSION: The patient was on Effexor from home to 150 mg b.i.d. and she was placed on the same medication.  However, the patients chest pain gradually subsided the day after admission and cardiac catheterization done, as per cardiology notes report, she had coronary arteriography of the left main which was normal and the main findings were moderately decreased cardiac output and moderately decreased left ventricular systolic function.  She had residual one-vessel coronary artery disease with moderate stenosis in the apical left anterior descending, which is a very small vessel at that point.  For that reason, the patient was not anticoagulated and the patients chest pain improved tremendously.  Therefore, she was discharged home on her home medications mainly.  DISCHARGE LABORATORY DATA: WBC 11.5, hemoglobin 10.7, platelets 312,000. Sodium 140, potassium 3.5, chloride 103, CO2 31, glucose 169, creatinine 1.1. Dictated by:   Kerrie Pleasure, M.D. Attending Physician:  [name] DD:  11/08/00 TD:  11/10/00 Job: [id number]

## 2010-05-20 NOTE — Assessment & Plan Note (Signed)
[facility]                         GASTROENTEROLOGY OFFICE NOTE   NAME:[name], [name]                   MRN:          [id number]  DATE:04/16/2006                            DOB:          11-10-1945    The patient says she is bloated all the time, reflux, diagnosed with  sleep apnea, has increasing constipation, passed some rectal clots at  times with bowel movements, some bleeding problems secondary to  hemorrhoids, questionable clots as we talked about.  She has used a  little MiraLax, not much.  She is diabetic, takes metformin.  Also takes  NovoLog, Lantus insulin, Altace, Coreg, Caduet, furosemide, a baby  aspirin, Protonix 40 mg daily, Effexor, Spiriva, Nasacort, Estrace,  trazodone, and hydroxyurea b.i.d. and some Percocet at times.  She has  had anemia in the past from questionable AV malformations and she also  has known arteriosclerotic cardiovascular disease with mitral  regurgitation, cardiomyopathy.  She hyperlipidemia, hypertension, the  insulin-dependent diabetes we talked about, and some COPD.  She had an  upper endoscopic examination in 2006 by Dr. Russella Dar, was found to have a 3-  cm hiatal hernia and some mild gastritis.  Colonoscopy examination was  done by Dr. Marina Goodell in 2003 and she was found to have some diverticular  disease and some internal hemorrhoids.  This was done for family history  of colorectal neoplasm and constipation and hematochezia.   PHYSICAL EXAMINATION:  She weighed 230, blood pressure 120/72, pulse 85  and regular.  Neck, lower extremities, general unremarkable.   IMPRESSION:  1. Bloating, constipation, gastroesophageal reflux disease, all      probably related to her diabetic gastroparesis.  2. Diabetes.  3. Arteriosclerotic cardiovascular disease with mitral regurgitation.  4. Mild chronic obstructive pulmonary disease.  5. Obesity.  6. Dyslipidemia.  7. Status post cholecystectomy.  8. History of sleep  apnea.  9. Rectal bleeding, probably related to internal hemorrhoids.   RECOMMENDATION:  Try some Flora-Q one daily, Xifaxan 200 mg one b.i.d.  for 10 days, and schedule a colonoscopic examination on her.  We did  look up her diabetic gastroparesis and gave her an article on this which  should explain a lot of her symptomatology.     Ulyess Mort, MD  Electronically Signed    SML/MedQ  DD: 04/16/2006  DT: 04/16/2006  Job #: [id number]   cc:   C. Ulyess Mort, M.D.

## 2010-05-20 NOTE — H&P (Signed)
[name], [name] NO.:  [id number]   MEDICAL RECORD NO.:  [id number]          PATIENT TYPE:  EMS   LOCATION:  [facility]                         FACILITY:  [facility]   PHYSICIAN:  Verne Grain, MD   DATE OF BIRTH:  [birth year]   DATE OF ADMISSION:  11/27/2004  DATE OF DISCHARGE:                                HISTORY & PHYSICAL   PRIMARY CARDIOLOGIST:  [name], M.D.   PRIMARY CARE PHYSICIAN:  Colen Darling. Aundria Rud, M.D. (family medicine).   [facility] SURGEON:  Dr. Dorita Fray.   CHIEF COMPLAINT:  Right-sided chest ain/right upper quadrant abdominal pain.  Admitted to rule out myocardial infarction from the [facility] Emergency  Room.   HISTORY OF PRESENT ILLNESS:  A 65 year old female with a history of an  inferior Q-wave myocardial infarction, treated with an RCA stent and  AngioJet (July 2002 with Dr. Gerri Spore).  With subsequent episodes of  atypical chest pain and cardiac catheterization in 2003, and subsequently in  December 2005 -- revealing patent right coronary artery stent with  borderline lesions in the remainder of the right coronary artery. There are  left circumflex and LAD distributions, with the exception of distal  significant  LAD stenosis (cardiac catheterization December 2005 by Dr.  Riley Kill:  Patent right coronary artery stent, right PDA 40%, right  posterolateral branch 46%, left main patent, LAD 30%, diagonal 40%, distal  LAD with tandem 95% stenoses, left circumflex 40%, left ventricular ejection  fraction 40-50%).  History of congestive heart failure with normal  creatinine on chronic b.i.d. Lasix, type 2 diabetes mellitus, obesity,  hypertension, hyperlipidemia, ongoing tobacco (5-6 cigarettes per day),  history of depression/anxiety/irritable bowel syndrome; reportedly  undergoing evaluation at [facility] for rectocele, with planned  surgical repair in January 2007.  She reports being in usual state of health  with daily  vacuuming and house work with no changes.  __________  tolerant;  no exertional symptoms of dyspnea or chest pain, reportedly onset of right-  sided chest discomfort beneath her right breast, and including right upper  quadrant abdominal region while eating breakfast.  She reports accompanying  symptoms of shortness of breath, nausea and diaphoresis. She reports the  pain as being somewhat reminiscent of her myocardial infarction that she  suffered in 2002, where the pain resolved after approximately 15-20 min.   The pain recurred multiple times later in the day and throughout the night,  which made it difficult for the patient to sleep.  At approximately noon on  the day of admission, the patient experienced an exacerbation of the pain at  a level of 8/10 in severity, for which she called EMS. She reports being  administered aspirin and sublingual nitroglycerin times one, with subsequent  resolution of her pain. She reports no recurrent chest pain since then;  however, palpation under her right breast and palpation of right upper  quadrant region reproduces pain. Positive Murphy's sign present.   PAST MEDICAL HISTORY:  1.  Coronary artery disease, status post inferior Q-wave myocardial      infarction; with cardiac catheterization July 2002  by  Dr. Gerri Spore.      Having placement of a right coronary artery stent and utilization of the      AngioJet catheter.  Subsequent presentation was atypical chest pain,      without abdominal symptomatology in 2003 and December 2005 -- with no      interventions performed, nonobstructive disease only the distal LAD      distribution (last cardiac catheterization with Dr. Riley Kill December      2005).  Left main patent,  LAD 30%, diagonal 40%, distal LAD 95%/95%,      left circumflex 40%, right coronary artery stent, right PA  40%, right      PLB 40-50%, left ventricular ejection fraction 40-50%.   1.  History of congestive heart failure:  (left  ventricular ejection      fraction 40-50%, with left ventriculography December 2005).  Maintained      on chronic b.i.d. Lasix with a normal creatinine.   1.  History of dyspepsia/GERD -- on chronic proton pump inhibitor therapy.   1.  History of obesity/type 2 diabetes mellitus on b.i.d. insulin regimen.  2.  Hypertension.  3.  Ongoing tobacco use (has cut down to 5-6 cigarettes per day).  4.  Hyperlipidemia.  5.  History of anxiety.  6.  History of depression.  7.  History of irritable bowel syndrome.  8.  History of chronic constipation.  9.  History of osteoarthritis.  10. History of insomnia.  11. History of diverticular GI bleed (February 2005) with iron-deficiency at      that time.  12. History of total abdominal hysterectomy with bilateral salpingo-      oophorectomy.  13. History of obstructive sleep apnea, status post uvulo calico      uvulopalatophary and GO plasty with bilateral inferior turbinate      reductions (__________ 01.)   ALLERGIES:  No known drug allergies.   CURRENT MEDICATIONS:  The patient does not have a list of her medications  with her; however, she reports no changes in her medication regimen sent for  admission in December 2005 -- with review of each medication individually in  the emergency room.  1.  Insulin 70/30 38 units subcu b.i.d. (increased slightly from previous      dose of 35 units b.i.d.).  2.  Norvasc 10 mg p.o. q.d.  3.  Lasix 80 mg  p.o. q.a.m. and 40 mg p.o. q.p.m.  4.  Lipitor  20 mg p.o. q.d.  5.  Effexor 150 mg p.o. b.i.d.  6.  Trazodone 150 mg p.o. q.h.s.  7.  Protonix 40 mg p.o. q.d.  8.  Coreg 50 mg p.o. b.i.d.  9.  Altace 10 mg p.o. q.d.  10. Aspirin 325 mg p.o. q.d.   SOCIAL HISTORY:  The patient lives in [place] , [place] with  her husband.  She has been married since 2002.  She has no biological  children; however, she does have step children through her husband.  She has a tobacco history of half a  pack per day times 40 years, and she reports  that she has cut down to approximately 5-6 cigarettes per day; however, does  continue to smoke on a daily basis. She reports no significant alcohol  history. She reports no illicit drug use.   FAMILY HISTORY:  The patient's mother died at age 30 of a stroke. The  patient's father died at age 65 of old age.  The patient has had has  six  siblings; one brother died of alcohol-related complications in his 24s. She  has 5 siblings who are alive -- the oldest of which has emphysema and the  youngest has breast cancer.   REVIEW OF SYSTEMS:  Notable for complaints of cold intolerance, but  otherwise essentially negative.   HISTORY OF PRESENT ILLNESS:  She reports no recent fever, chills, sweats; no  recent acute wave changes.  She reports no headaches, no acute alterations  in auditory or visual acuity. She reports no other skin lesions.  She does  report chest pain, shortness of breath, nausea and right upper quadrant,  right chest discomfort (as described in the HPI).  She reports no dyspnea on  exertion, orthopnea PND or edema. She reported no presyncope, no  claudication. She has no urinary symptoms. She has chronic constipation (as  in past medical history).  Some symptoms of reflux disease, but reports no  recent episodes of GI bleeding and no acute alterations in her bowel habits.  She has a history depression, insomnia and anxiety; but no acute  neuropsychiatric  complaints.  She has been stable on her Effexor and  trazodone (as reported above).  She has no symptoms of polyuria or  polydipsia; no skin or hair changes reported.  All other systems are  negative.   PHYSICAL EXAMINATION:  VITAL SIGNS:  Temperature 97.7, heart rate 80,  respiratory rate 18, blood pressure 141/79.  Oxygen saturation 96% on room  air.  GENERAL:  The patient is pleasant, alert; answers questions appropriately.  She is in no apparent distress.  HEENT:  She is  normocephalic, atraumatic. Extraocular eye movements are  intact.  Oropharynx is pink and moist without lesions.  NECK:  Supple. There is no jugular venous distention appreciated; although  examination was somewhat limited secondary to body habitus.  There are no  carotid bruits heard. Carotid pulses easily palpated bilaterally.  CARDIOVASCULAR:  Reveals a regular S1 and a regular S2.  Heart sounds are  somewhat soft, though there are no murmurs appreciated.  LUNGS:  Lung fields are clear to auscultation bilaterally.  SKIN:  Limited examination revealed no acute rash.  ABDOMEN:  Soft, nontender, nondistended -- with positive bowel sounds. There  is discomfort with palpation of right chest beneath the breasts, as well as  the right upper quadrant with positive Murphy's sign.  EXTREMITIES:  Lower extremity examination reveals no evidence of edema.  The  patient's femoral pulses are 2+ and symmetric bilaterally. There are no  femoral bruits. NEUROLOGIC:  Alert.  The patient answers questions appropriately. She  appears to move all 4 extremities without difficulty, although gait was not  tested.   CHEST X-RAY:  No acute disease on preliminary read; although final report is  pending.   EKG:  Heart rate of 70, in sinus rhythm with a normal axis.  First-degree AV  block with a P-R interval measured at approximately 220 msec.  QRS duration  is essentially normal. QT intervals approximately 44 msec.  There are  inferior Q-waves noted in leads III and aVF; with history of inferior Q-wave  myocardial infarction.  There are no ST or T-wave changes diagnostic of  ischemia; there are nonspecific T-wave abnormalities noted.   LABORATORY VALUES:  Hematocrit 43, platelet count 141.  Potassium 3.6,  chloride 102, bicarb 33, anion gap 6, BUN 10, creatinine 1.2, glucose 98, CK-  MB 1.3, troponin I less than 0.055.  Myoglobin 71.  Venous blood gas with a  pH of 7.34 and pCO2 of 61, bicarb 33.    ASSESSMENT/PLAN:  A 65 year old female with history of inferior Q-wave  myocardial infarction; treated with RCA stent and AngioJet by Dr. Gerri Spore  in July 2002, with subsequent repetitions with atypical chest pain; also  with components of abdominal discomfort.  The cardiac catheterization in  2000 and more recently in December 2005 with no intervention performed, in a  patient with nonobstructive coronary disease except for distal LAD disease  (described in HPI), congestive heart failure with a left ventricular  ejection fraction 40-50% -- maintained on b.i.d. Lasix with a normal  creatinine, ongoing tobacco use (5-6  cigarettes per day), hypertension,  hyperlipidemia, type 2 diabetes mellitus, obesity, history of atypical chest  pain/irritable bowel syndrome/chronic constipation (awaiting surgery in  [facility] by Dr. Dorita Fray for rectocele repair), noting the onset of right  upper quadrant/right chest pain below the right breast; relieved with  sublingual nitroglycerin x1, and aspirin.  __________ reproduced with  palpation of the right chest wall and right upper quadrant with notation of  positive Murphy's sign. History of GI bleed, attributed to diverticular  disease noted in February 2005. Other review of systems notable only for  complaints of cold intolerance.   1.  Right chest pain/ right upper quadrant abdominal pain -- With history of      coronary artery disease, and patient reports of symptoms being similar      to previous myocardial infarction. We will monitor the patient on      telemetry with serial EKGs, CK-MBs and troponin -- to exclude the      presence of myocardial infarction. We will continue her excellent      medical regimen of aspirin, beta-blocker, ACE inhibitor, statins with      unfractionated heparin. (positive history of GI bleed of uncertain      etiology).  Also right upper quadrant discomfort complaint.  If the      patient's cardiac markers remain  negative and the patient has no     recurrent pain, the patient could be considered for discharge with      follow-up adenosine Cardiolite in Dr. Jenene Slicker cardiology clinic --      with plan to send the medical records from this admission and the stress      test report to Dr. Dorita Fray, as they will likely be useful for her      upcoming planned rectocele repair. Will check LFTs with white blood cell      count differential, amylase, lipase and right upper quadrant ultrasound      to evaluate potential cholecystitis or symptomatic cholelithiasis, as      the  etiology for the patient's above complaints and in light of      positive Murphy's sign.  2.  Congestive heart failure/coronary artery      disease/hyperlipidemia/hypertension.  Will continue the patient on      aspirin, Coreg, Altace, Lipitor, Norvasc, Lasix (per her usual      outpatient regimen); and check LFTs and lipid profile; with LFTs and      a.m. lipid profile to ensure the patient's lipids are at goal      (triglycerides less than 150, LDL less than 70). Will continue to      monitor the patient's blood pressure on her regimen of Coreg, Altace and      Norvasc to assure that her blood pressure is at goal (less than 135/85)  on her usual medical regimen.  3.  Type 2 diabetes mellitus/Obesity.  I will continue  the patient on her      b.i.d. insulin regimen -- with a diabetic diet and supplemental sliding-      scale insulin as needed. The patient will be NPO after midnight, except      for medications should she require any further testing in the morning.      We will check a hemoglobin A1c with her laboratory values and encourage      adhering to a low glycemic diet and weight loss with increased activity      as tolerated.  4.  Tobacco use. Continue to encourage cessation  A tobacco cessation      consult for in the morning has been written.  5.  Potassium of 3.6.  We will supplement with potassium 40 mg p.o. x1  now;      and recheck her potassium in the morning and supplement as needed with      the regimen of chronic Lasix. The patient reports potassium      supplementation at home on a regular basis; although again, she does not      have her medication list with her. She was able to describe her      medications upon review of her discharge medications from December 2005.  6.  Review of systems with endorsement of cold intolerance, severe      depression/irritable bowel syndrome/insomnia.  Will check a TSH with the      patient's other aforementioned laboratory values.  We will continue her      on Effexor and trazodone.  7.  Gastric reflux and dyspepsia. We will increase the patient's aspirin      dose to 152 mg p.o. q.d.; and increase her Protonix to 40 mg b.i.d.      during the hospitalization.  8.  Medical records, as mentioned above, may be useful for Dr. Dorita Fray and      a copy of discharge summary  from this hospitalization along with likely      stress test results -- so that he may have this information available     for his planned rectocele repair that the patient anticipated in January      2007.           ______________________________  Verne Grain, MD     DDH/MEDQ  D:  11/27/2004  T:  11/28/2004  Job:  [id number]   cc:   [name], M.D.  [address]  [address]  [place]  [place] [address]

## 2010-05-20 NOTE — Discharge Summary (Signed)
[name], [name] NO.:  [id number]   MEDICAL RECORD NO.:  [id number]                   PATIENT TYPE:  INP   LOCATION:  3311                                 FACILITY:  [facility]   PHYSICIAN:  [name] Asal, MD             DATE OF BIRTH:  01-13-1945   DATE OF ADMISSION:  11/19/2002  DATE OF DISCHARGE:  11/22/2002                                 DISCHARGE SUMMARY   DISCHARGE DIAGNOSES:  1. Gastrointestinal bleed.  2. Iron-deficiency anemia.  3. Chest pain.  4. Diabetes mellitus.  5. Hypertension.  6. Depression.   DISCHARGE MEDICATIONS:  1. Insulin 70/30 35 units b.i.d.  2. Lasix 80 mg p.o. q.a.m. and 40 mg p.o. q.p.m.  3. K-Dur 10 mEq p.o. b.i.d.  4. Lipitor 20 mg p.o. daily.  5. Effexor 75 mg p.o. q.a.m. and 150 mg p.o. q.p.m.  6. Trazodone 150 mg p.o. q.h.s.  7. Protonix 40 mg p.o. daily.  8. Coreg 50 mg p.o. b.i.d.  9. Altace 10 mg p.o. b.i.d.  10.      Nitroglycerin 0.4 mg sublingual q. 5 minutes p.r.n. x3 for chest     pain.  11.      Iron sulfate 325 mg one tablet with meals three times a day.  12.      Vistaril 25 mg p.o. b.i.d.  13.      Norvasc 10 p.o. daily.  14.      Aspirin 325 mg p.o. daily.   FOLLOW UP:  The patient was to follow up with Dr. Aundria Rud in the [facility] on December 02, 2002, at 10:45.  She was also given an  appointment to see Dr. Victorino Dike at [facility] on January 05, 2003, at 3 o'clock p.m.   PROCEDURE:  Colonoscopy and upper GI that was performed on November 21, 2002.   GI consult was obtained on November 20, 2002, with the [facility].   BRIEF HISTORY AND PHYSICAL:  From November 19, 2002, compiled by Dr. Liliane Channel.   [name] is a 65 year old African American female with past medical  history significant for lower GI bleed one and a half years ago (the patient  states secondary to diverticular bleed) as well as CAD with an MI in 2002  with two stents placed  to the RCA, congestive heart failure (EF 40%),  diabetes mellitus, hypertension, irritable bowel syndrome.  She presented to  the outpatient clinic with weakness, dizziness, and three days of black  diarrhea with blood clots.  She states that she has been weak and  lightheaded with severe (10/10) abdominal cramps relieved after passing gas.  On Monday (November 17, 2002), the patient had blood spotting from the anus  that she attributed to hemorrhoids.  Later Monday, had one episode of black  diarrhea (with blood clot).  She had one more similar episode on Tuesday and  one Wednesday morning  after her [facility] clinic appointment.  She also describes  left-sided substernal chest pain described as crampy, lasting 10 to 15  minutes, usually while in bed and relieved after passing gas or after a  bowel movement.  She has had this two to three times per day for the last  several years.  No associated shortness of breath but occasionally  diaphoretic and nauseated.   In addition to past medical history above, she has history of  hyperlipidemia, bipolar disorder with history of depression, status post  hysterectomy.   She has a substance history significant for tobacco use one pack per day x40  years.  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   FAMILY HISTORY:  Noncontributory.   REVIEW OF SYMPTOMS:  As described.   PHYSICAL EXAMINATION:  GENERAL APPEARANCE:  Awake, alert and oriented.  No  acute distress.  VITAL SIGNS:  Pulse 78, blood pressure 132/98, temperature 98.1, respiratory  rate 20 per minute, O2 saturation 97% on room air.  HEENT:  Eyes:  Pupils are equal, round, reactive to light and accommodation.  Extraocular muscles intact.  No jaundice.  No pallor.  ENT:  Ears were  unremarkable.  Nose unremarkable.  Throat without exudate or erythema.  NECK:  No JVD, no thyromegaly.  LUNGS:  Clear to auscultation bilaterally with no rhonchi, no wheezes, no   crackles.  CARDIOVASCULAR:  Regular rate and rhythm with no murmurs, rubs, or gallops.  ABDOMEN:  Soft, positive bowel sounds, some mild tenderness in both lower  quadrants.  EXTREMITIES:  No ankle edema, 2+ dorsalis pedis pulses.  GU:  Guaiac positive, sticky black stool.  SKIN:  No rash.  LYMPHS:  No lymphadenopathy.  NEUROLOGIC:  Cranial nerves II-XII intact.  Reflexes 2+ bilaterally. Sensory  intact.  Babinski downgoing.  PSYCHIATRIC:  Appropriate.   LABORATORY DATA:  Sodium 143, potassium 3.5, chloride 105, bicarb 31, BUN  12, creatinine 0.9, glucose 171.  Anion gap 7.  Bilirubin 0.2, alkaline  phosphatase 114, SGOT 22, SGPT 19, protein 6.3, albumin 3.2, calcium 8.5.  CBC with white blood cell count 6.3, hemoglobin 8.1, platelets 280, MCV 84.   The patient was admitted with GI bleed.   PROBLEM #1 -  WEAKNESS:  Likely secondary to GI bleed given critical  findings.   PROBLEM #2 -  GASTROINTESTINAL BLEED:  Likely lower GI bleed given black,  tarry stool.  Has had bright red blood per rectum.  If this is from upper  GI, she would be expected to be very much more hemodynamically unstable  which was not case.  Differential diagnosis included an anatomic etiology  (diverticulosis), vascular (angiodysplasia, ischemia), inflammatory  (infectious, idiopathic), or neoplastic.  Unknown etiology 60% of the time.  The patient given two large bore IVs with normal saline at 100 mL per hour,  ready for faster rate if required.  Given the patient's hemoglobin and  history of coronary artery disease, she was transfused with two units of  packed rbc.  CBG was checked q.6h.  She was kept NPO until GI evaluation.  She was started on Protonix 40 mg IV b.i.d.  It was thought that given her  history, her primary need was for colonoscopy per GI.  Given her past  medical history it was thought that her bleed was likely secondary to a diverticular source.  Throughout her hospitalization, her  hemoglobin  remained stable.  GI was consulted and determined that she needed an upper  and  lower GI study.  Per GI, her EGD showed no source of bleeding.  Given  her known extensive diverticulosis from past colonoscopies, the GI team  suspected the patient's source was a diverticular bleed that resolved during  her hospitalization.  They suggested starting iron replacement and to  advance her diet and signed off on the 19th.  She remained hemodynamically  stable throughout the rest of her hospitalization, although her blood  pressure medications were held given the possibility of a GI bleed that  might progress.  Her blood pressure medicines were later restarted with the  resolution of her bleed.  She was started on iron for iron-deficiency anemia  secondary to her diverticular bleed. She was also discharged with Protonix  40 mg p.o. daily.   PROBLEM #3 -  ANEMIA:  This was likely secondary to GI bleed.  Nonetheless,  an iron profile, ferritin, B12 and folate were checked and the patient was  transfused with two units of packed red blood cells for a hemoglobin of 7.8.  This brought the patient's hemoglobin up to 9.8.  She was found to have  ferritin of 8 and iron of 16 and total iron binding capacity of 3, 97%,  saturation of 4, vitamin B12 614 and folate of greater than 20, suggesting  that the patient indeed had iron-deficiency anemia.  She was started on oral  iron supplementation and discharged with this medicine.  Her hemoglobin post  transfusion remained stable at approximately the 9 range and she was  discharged with follow-up with her primary care physician, Dr. Aundria Rud, as  well as follow-up with her gastroenterologist, Dr. Corinda Gubler, at [facility].   PROBLEM #4 -  CHEST PAIN:  The patient's history did not sound like chest  pain secondary to coronary artery disease but rather chest pain from a GI  source but could not exclude ischemia.  An EKG was checked as well as serial   cardiac enzymes.  Her EKG was found to be unremarkable for ischemic changes  and enzymes remained within normal limits.  She was chest pain-free by day  #2 of her hospitalization and remained symptom-free with no evidence of  cardiac ischemia or infarction.  She was discharged on her medical regimen  for her known coronary artery disease including Lipitor 20 mg daily, Coreg  50 mg p.o. b.i.d., Altace 10 mg p.o. b.i.d., and Vistaril 25 mg p.o. b.i.d.,  Norvasc 10 mg p.o. daily, aspirin 325 mg p.o. daily, and nitroglycerin 0.4  sublingual q. 5 minutes p.r.n. x3 for chest pain.   PROBLEM #5 -  DIABETES MELLITUS:  Given that patient was made NPO at her  admission, she was started on sliding scale insulin and her CBGs were  checked q.4h.  She remained reasonably well controlled with sliding scale  insulin throughout her hospitalization and was started on NPH 12 units b.i.d. upon resuming her p.o. diet.  She tolerated this dose well during her  hospitalization and was discharged back on her home regimen of insulin 70/30  35 units b.i.d.   PROBLEM #6 -  HYPERTENSION:  On admission to the hospital, the patient's  blood pressure was 132/98.  Given that she was having a GI bleed, her blood  pressure medicines were held.  Her blood pressure medicines were resumed at  the resolution of her bleeding.  Her blood pressure remained relatively well  controlled throughout her hospitalization and she was discharged on her home  blood pressure medications including Coreg 50 mg  p.o. b.i.d., Altace 10 mg  p.o. b.i.d., and Norvasc 10 mg p.o. daily.   PROBLEM #7 -  DEPRESSION:  The patient had a history of depression and was  given Effexor and trazodone which were her home medicines upon resolution of  her GI bleed and once her p.o. diet was resumed.   DISPOSITION:  The patient's GI bleed resolved after one day of  hospitalization.  Upper GI did not reveal any upper GI source of bleed and  gastroenterologist  felt that her bleeding was likely secondary to a  diverticular bleed (she was known to have extensive diverticulosis).  Her  hemoglobin on admission was in the 7 range.  She was transfused and  hemoglobin remained stable throughout the rest of her hospitalization.  Given that she was stable and  no longer having a GI bleed, it was felt that  it was safe for her to return home with close follow-up with her primary  care physician, Dr. Aundria Rud, and her gastroenterologist.  She was given  appointments to see both of her doctors and was discharged to home with her  necessary medicines.   DISCHARGE LABORATORY DATA:  CBC with wbc 8.1, rbc 3.34, hemoglobin 9.9,  hematocrit 28.9, MCV 86.5, MCHC 34.3, RDW 16.2, platelets 274.  BMP with  sodium 173, potassium 3.7, chloride 109, CO2 26, glucose 148, BUN 4,  creatinine 0.8, calcium 8.4.                                                [name] Asal, MD    CW/MEDQ  D:  02/18/2003  T:  02/19/2003  Job:  [id number]   cc:   Outpatient Clinic

## 2010-05-20 NOTE — Op Note (Signed)
[name], [name]            ACCOUNT NO.:  [id number]   MEDICAL RECORD NO.:  [id number]          PATIENT TYPE:  INP   LOCATION:  3729                         FACILITY:  [facility]   PHYSICIAN:  [name], MDDATE OF BIRTH:  03/03/1945   DATE OF PROCEDURE:  12/01/2004  DATE OF DISCHARGE:                                 OPERATIVE REPORT   PREOPERATIVE DIAGNOSIS:  Symptomatic cholelithiasis.   POSTOPERATIVE DIAGNOSIS:  Symptomatic cholelithiasis.   PROCEDURE:  Laparoscopic cholecystectomy.   SURGEON:  [name], M.D.   ASSISTANT:  Gabrielle Dare. Janee Morn, M.D.   ANESTHESIA:  General.   DESCRIPTION:  The patient was taken to the operating room and placed in the  supine position.  After adequate anesthesia was induced using endotracheal  tube, the abdomen was prepped and draped in normal sterile fashion.  Using a  supraumbilical vertical incision, I dissected down to fascia.  The fascia  was opened vertically.  An 0 Vicryl pursestring suture was placed around the  fascial defect.  Hasson trocar was placed in the abdomen, and  pneumoperitoneum was obtained using continuous flow carbon dioxide.  Under  direct visualization, an 11-mm trocar was placed in the subxiphoid region,  two 5-mm trocars were placed in the right abdomen.  The gallbladder was  identified and retracted cephalad.  Dissection at the infundibulum of the  gallbladder revealed the cystic duct.  Critical view was obtained, and its  junction with the gallbladder was visualized.  The cystic duct was triply  clipped and divided as was the cystic artery.  The gallbladder was taken off  the gallbladder bed; however, there was very minimal mesentery that was  identified.  The gallbladder bed was very oozy and required placement of  FloSeal and Surgicel.  The gallbladder was placed in an EndoCatch bag and  removed through the umbilical port.  Adequate hemostasis had been insured.  The fascial defect was  closed with the 0 Vicryl pursestring suture.  Skin  incisions were closed with subcuticular 4-0 Monocryl.  Steri-Strips and  sterile dressings were applied.  The patient tolerated the procedure well  and went to PACU in good condition.      [name], MD  Electronically Signed     KRH/MEDQ  D:  12/01/2004  T:  12/02/2004  Job:  [id number]

## 2010-05-20 NOTE — Consult Note (Signed)
[name], [name]            ACCOUNT NO.:  [id number]   MEDICAL RECORD NO.:  [id number]          PATIENT TYPE:  INP   LOCATION:  3729                         FACILITY:  [facility]   PHYSICIAN:  [name], MDDATE OF BIRTH:  Jun 11, 1945   DATE OF CONSULTATION:  11/30/2004  DATE OF DISCHARGE:                                   CONSULTATION   REASON FOR CONSULTATION:  Cholelithiasis.   HISTORY OF PRESENT ILLNESS:  Ms. [name] is a 65 year old female who was  admitted three days ago with chest pain/right upper quadrant pain.  She  ruled out for myocardial infarction despite her significant past medical  history of coronary artery disease.  Her pain was felt to be noncardiac in  origin.  A gallbladder ultrasound revealed cholelithiasis but no evidence of  cholecystitis.  A HIDA scan showed gallbladder filling of 45 minutes with  common bile duct emptying into the duodenum in 10 minutes.  An EGD was  performed that revealed gastritis as well as a hiatal hernia.  We were  consulted for cholecystectomy for symptomatic cholelithiasis.   ALLERGIES:  NO KNOWN DRUG ALLERGIES.   CURRENT MEDICATIONS:  1.  Norvasc.  2.  Aspirin.  3.  Coreg.  4.  Colace.  5.  Lasix.  6.  Insulin.  7.  Protonix.  8.  Altace.  9.  Zocor.  10. Desyrel.  11. Effexor.   PAST MEDICAL HISTORY:  1.  Coronary artery disease.  2.  Obesity.  3.  Diabetes mellitus.  4.  Hypertension.  5.  Tobacco abuse.  6.  Hyperlipidemia.  7.  Obstructive sleep apnea.  8.  Osteoarthritis.  9.  Anxiety/depression.  10. Status post TAHBSO.   SOCIAL HISTORY:  Tobacco use.  No known illicit drug use.  She is married  and lives in [place], [place].   FAMILY HISTORY:  Mom died at age 84 from a CVA.  Dad died from unknown  reasons.   LABORATORY DATA:  Normal LFTs.  Potassium 3.1.  INR normal.  White count  6.7, hemoglobin 12.3.   Chest x-ray shows mild cardiomegaly.   PHYSICAL EXAMINATION:  VITAL  SIGNS:  Temperature 97.9, pulse 77,  respirations 20, blood pressure 118/80.  HEENT:  Grossly normal.  Sclerae clear.  Conjunctivae normal.  Nares without  drainage.  NECK:  No JVD or thyromegaly.  CHEST:  Clear to auscultation bilaterally.  HEART:  Regular rate and rhythm.  No murmurs, rubs, or gallops.  ABDOMEN:  Obese.  Good bowel sounds.  She does have right upper quadrant  tenderness.  EXTREMITIES:  Lower extremities with no peripheral edema.  SKIN:  Warm and dry.  NEUROLOGIC:  Intact.  She is somewhat groggy but awakens from stimulants  (just out of her EGD).   ASSESSMENT:  1.  Symptomatic cholelithiasis.  2.  Coronary artery disease.  3.  Obesity.  4.  Diabetes mellitus.  5.  Hypertension.  6.  Tobacco abuse.  7.  Hyperlipidemia.  8.  Osteoarthritis.  9.  Obstructive sleep apnea.  10. Anxiety/depression.  11. Status post total abdominal hysterectomy and bilateral  salpingo-      oophorectomy.   PLAN:  The patient has been seen and examined by Dr. Luan Pulling.  Because of  her symptomatic cholelithiasis, we plan to perform a cholecystectomy in the  morning of December 01, 2004.   Thank you for the consult.      Guy Franco, P.A.      [name], MD  Electronically Signed    LB/MEDQ  D:  11/30/2004  T:  12/01/2004  Job:  [id number]

## 2010-05-20 NOTE — Discharge Summary (Signed)
NAMEDESTYNI, HOPPEL NO.:  [id number]   MEDICAL RECORD NO.:  [id number]          PATIENT TYPE:  INP   LOCATION:  4703                         FACILITY:  [facility]   PHYSICIAN:  Ileana Roup, M.D.  DATE OF BIRTH:  10-02-1945   DATE OF ADMISSION:  12/07/2003  DATE OF DISCHARGE:  12/09/2003                                 DISCHARGE SUMMARY   DISCHARGE DIAGNOSIS:  1.  Constipation.  2.  Atypical chest pain.  3.  Diabetes mellitus.  4.  Hypertension.  5.  History of coronary artery disease status post catheterization December 08, 2003.  6.  Depression.  7.  Osteoarthritis.  8.  Congestive heart failure.  9.  History of diverticulosis with several episodes of gastrointestinal      bleeding.   DISCHARGE MEDICATIONS:  1.  Insulin 70/30, 35 units subcu q.a.m., 35 units subcu q.p.m.  2.  Norvasc 10 mg p.o. daily.  3.  Lasix 80 mg p.o. q.a.m., 40 mg p.o. q.h.s.  4.  Potassium chloride 20 mEq p.o. daily.  5.  Lipitor 20 mg p.o. daily.  6.  Effexor 150 mg p.o. b.i.d.  7.  Trazodone 150 mg p.o. q.h.s.  8.  Protonix 40 mg p.o. daily.  9.  Coreg 50 mg p.o. b.i.d.  10. Altace 10 mg p.o. daily.  11. Nitroglycerin tablets sublingual p.r.n. chest pain.  12. Aspirin 325 mg p.o. daily.   CONDITION ON DISCHARGE:  Stable and improved.   DISCHARGE INSTRUCTIONS:  Activities:  No heavy lifting for two weeks.  Wound  care:  Keep cath site clean and dry and wash with warm soap and water.  Follow up with Dr. Aundria Rud as previously scheduled or to be scheduled by the  patient.   CONSULTATIONS:  [name], M.D., cardiology.   BRIEF ADMISSION HISTORY AND PHYSICAL:  [name] is a 65 year old  African American woman with a past medical history as mentioned in the  discharge diagnosis who presented to the emergency department the early  morning of December 07, 2003, with chief complaint of 2-3 days of left lower  quadrant abdominal discomfort as well as two days of  chest discomfort  similar in character to the episode that lead to the diagnosis of coronary  artery disease.  She describes the chest pain as persistent on the left side  of the chest with relief by nitroglycerin.  Abdominal pain was described as  dull, persistent, and predominantly located in the left lower quadrant.  Of  note, she has been progressively constipated over the last 5-7 days.  Her  stools have been harder than normal and decreased in quantity.   Physical exam reveals temperature 99.1, pulse 108, blood pressure 151/90,  respirations 20, O2 saturation 98% on room air.  General:  She is in no  acute distress.  Pupils equal, round, reactive to light and accommodation,  extraocular movements intact.  Oropharynx clear, she has dentures in place.  There is approximately 5 cm of JVD.  No thyromegaly or carotid bruits.  Lungs clear to auscultation bilaterally, no wheezes, rales, or  rhonchi.  Heart:  Regular rate and rhythm without murmurs, gallops, and rubs.  Abdomen  soft, positive bowel sounds, tender to deep palpation in the left lower  quadrant.  There is no peripheral edema, pulses were 2+ bilaterally  peripherally.  Neurological:  Cranial nerves 2-12 are intact.  Strength 5/5  bilaterally.  There were no sensation deficits.   LABORATORY DATA:  Admission labs reveal sodium 141, potassium 3.2, bicarb  29, chloride 105, BUN 5, creatinine 0.9, glucose 256.  AST 15, ALT 17,  alkaline phos 97, bilirubin 0.4, protein 6.3, albumin 3.1, calcium 8.7.  White count 7.6, hemoglobin 13.4, hematocrit 38.7, platelets 202, MCV 88,  lipase 38.  Point of care cardiac markers negative x 3.  EKG was consistent  with an old inferior MI, there were no acute ST or T wave changes.  Chest x-  ray showed bibasilar atelectasis versus infiltrate.   HOSPITAL COURSE:  Problem 1:  Chest pain.  Given Ms. [name]'s significant history of  coronary artery disease and previous percutaneous intervention, she  was  admitted to telemetry.  Cardiac enzymes were cycled and cardiology consult  was placed.  She was placed on heparin drip throughout the admission.  She  was taken to the catheterization lab on December 08, 2003.  Ejection fraction  was found to be 40% which is consistent with the previous catheterization  performed in 2003.  The LAD was found to have distal disease, the circumflex  was clear and the right coronary was clear with good patency.  She will be  discharged on the same regimen she was admitted.  She will follow up with  her primary care physician, Dr. Ulyess Mort.   Problem 2:  Abdominal pain.  This pain was mostly located in the left lower  quadrant.  Given her history of diverticulosis, diverticulitis was in the  differential but she was afebrile and had normal white blood cell count.  Abdominal x-ray showed  large amount of stool in the large intestine and  stool impaction.  She was started on MiraLax 10 grams daily and proceeded to  have several bowel movements throughout the admission.  Subsequent to the  bowel movements, the pain was decreased.  She was advised to increase the  fiber and to take fiber supplements as well as stool softeners.  She was  encouraged to use Fleet enemas as necessary after discharge to insure a GI  clean out.  She was advised to return to the emergency department if there  is any bleeding of the rectum or severe darkening of the stools.  Of note,  she had a colonoscopy in 2003 that showed diverticulosis but no other  lesions.   Problem 3:  Diabetes mellitus.  Her CBGs had been decreased at home because  of poor p.o. intake, therefore, her dose of NPH was decreased to  approximately half her home dose.  On hospital day two, her CBGs were  elevated into the 200s and her insulin was titrated up.  Because of her  n.p.o. status, she was started on D5 1/2 normal saline on the day of the catheterization.  This lead to elevations in her CBG.  D5  1/2 normal saline  was discontinued on the day of discharge.  She will be restarted on her home  regimen which is 35 units in the morning and 35 units at night.  She is  encouraged to continue to record daily logs of her CBG and take those to her  next appointment with her primary care physician.  Hemoglobin A1C was  checked during admission and was found to be 8.6.   Problem 4:  Hypokalemia.  This is probably secondary to administration of  Lasix in the setting of decreased p.o.  The potassium was repleted.   LABORATORY DATA ON DISCHARGE:  Sodium 138, potassium 3.9, chloride 105,  bicarb 28, BUN 9, creatinine 0.8, glucose 259.  White count 7.7, hemoglobin  12.8, hematocrit 36.1, platelets 280.  Total cholesterol 158, triglycerides  97, HDL 32, LDL 107, TSH 2.2.       BM/MEDQ  D:  12/09/2003  T:  12/09/2003  Job:  [id number]

## 2010-05-20 NOTE — Discharge Summary (Signed)
NAMEGUILLERMINA, SHAFT                        ACCOUNT NO.:  [id number]   MEDICAL RECORD NO.:  [id number]                   PATIENT TYPE:  INP   LOCATION:  4743                                 FACILITY:  [facility]   PHYSICIAN:  Madolyn Frieze. Jens Som, M.D. [facility]         DATE OF BIRTH:  06-01-1945   DATE OF ADMISSION:  08/29/2001  DATE OF DISCHARGE:  08/30/2001                                 DISCHARGE SUMMARY   DISCHARGE DIAGNOSES:  1. Chest pain, status post Cardiolite exam.  2. History of congestive heart failure.  3. Coronary artery disease, history of myocardial infarction, 2002.  4. Type 2 diabetes mellitus.  5. Irritable bowel syndrome.  6. Hypertension.  7. Hyperlipidemia.  8. Bipolar disorder.   HOSPITAL COURSE:  The patient is a 65 year old female who was brought to the  emergency room on August 28th with chest pain which she reported as being  periodic for the past week.  On the morning of admission, she stated the  pain worsened approximately 11:00 and was 10/10 at approximately 2 p.m.  She  took one sublingual nitroglycerin with some relief.  She was seen and  admitted by Dr. Olga Millers.  Dr. Jens Som felt that the patient's pain  was likely noncardiac in nature with most likely etiologies being either  gastrointestinal or musculoskeletal in nature; however, because of this  patient's history, he felt it was prudent to admit the patient and rule out  for myocardial infarction.  If the enzymes were negative, then the patient  would undergo a stress test.   The next day the patient reports continued tenderness of the right breast  area.  Her potassium was noted to be 3, and this was repleted.  Later that  day, the patient underwent adenosine Cardiolite exam.  Nuclear imaging  revealed ejection fraction of 24%.  The study was technically suboptimal and  was reviewed by Dr. Jens Som.  He noted the presence of an old infarction;  however, he felt there was no significant  ischemia.  The patient was felt to  be stable for discharge.   DISCHARGE MEDICATIONS:  1. Norvasc 10 mg q.d.  2. Trazodone 150 mg q.h.s.  3. Lipitor 20 mg q.h.s.  4. Coreg 50 mg b.i.d.  5. Altace 10 mg b.i.d.  6. Insulin as previously taken.  7. Zelnorm as previously taken.  8. Enteric-coated aspirin 325 mg q.d.  9. Sublingual nitroglycerin p.r.n.  10.      Effexor as previously taken.  11.      Lasix 40 mg q.a.m., 20 mg q.p.m.  12.      Protonix 40 mg q.d.  13.      Potassium 10 mEq b.i.d.; the patient was instructed to take 20 mEq     b.i.d. on August 30th only.  14.      Tylenol III, 1-2 tabs q.4-6h. p.r.n. for pain.    DISCHARGE INSTRUCTIONS:  1. The patient was  advised to return to her normal level of activity.  2. She is to follow a low-fat, low-salt diet.  3. She is to get a BMET test drawn on the 4th of September at the [facility].  4. She is to follow up with [name] and the P.A. in the office on     September 12th at 11 in the morning; also with a BMET as well as motor     function test.     C. Brita Romp, P.A. [facility]                  Madolyn Frieze. Jens Som, M.D. [facility]    CKM/MEDQ  D:  08/30/2001  T:  09/02/2001  Job:  [id number]   cc:   Dr. Eveline Keto, M.D. [facility]

## 2010-05-20 NOTE — Cardiovascular Report (Signed)
NAMEDHRUVI, CRENSHAW            ACCOUNT NO.:  [id number]   MEDICAL RECORD NO.:  [id number]          PATIENT TYPE:  INP   LOCATION:  4703                         FACILITY:  [facility]   PHYSICIAN:  Arturo Morton. Riley Kill, M.D. [facility] OF BIRTH:  09-14-1945   DATE OF PROCEDURE:  12/08/2003  DATE OF DISCHARGE:                              CARDIAC CATHETERIZATION   INDICATIONS:  [name] is a 65 year old female who has presented with  some recurrent chest discomfort.  She has had previous catheterization and  stenting of the right coronary.  The last catheterization in 2003 revealed  diffuse distal LAD disease.  The current study was done because of recurrent  symptoms.   PROCEDURES:  1.  Left heart catheterization.  2.  Selective coronary arteriography.  3.  Selective left ventriculography.   DESCRIPTION OF PROCEDURE:  The patient was brought to the catheterization  laboratory and prepped and draped in the usual fashion.  Through an anterior  puncture, the right femoral artery was easily entered.  A 6 French sheath  was placed.  Views of the left and right coronary arteries were obtained in  multiple angiographic projections.  Central aortic and left ventricular  pressures were measured with a pigtail.  Ventriculography was performed in  the RAO projection.  Following pressure pullback, the pigtail catheter was  removed.  The patient was taken to the holding area in satisfactory clinical  condition.   HEMODYNAMIC DATA:  1.  Central aortic pressure 136/92.  2.  Left ventricular pressure 147/11.  3.  No gradient on pullback across the aortic valve.   ANGIOGRAPHIC DATA:  1.  Ventriculography was performed in the RAO projection.  There was mid      inferior hypokinesis.  I would estimate the ejection fraction in the      range of about 45%.  2.  The right coronary artery was a large caliber vessel.  At the previous      stent, it is patent.  Beyond the takeoff of the PDA is about a  40% area      of plaquing which has slightly progressed from the previous study.      There is 40-50% narrowing in the posterolateral branch which represents      with second PL.  3.  The left main coronary artery is free of critical disease.  4.  The LAD has about 30% narrowing after the origin of the diagonal.  The      diagonal itself has about 40% proximal segmental plaquing.  The LAD is      open until after a distal diagonal and then is diffusely and severely      diseased at the apical portion with at least tandem stenosis of 95%.  5.  There is a small ramus intermedius that is free of critical disease.  6.  The AV circumflex is a moderate size vessel with 40% segmental plaquing.   CONCLUSIONS:  1.  Mild reduction in left ventricular function with a mid inferior wall      area of hypokinesis.  2.  Continue patency of the right  coronary artery with very slight      progression of disease in the continuation segment between the origins      of the posterior descending and posterolateral 1.  3.  Severe distal disease of the left anterior descending artery as noted      above.   RECOMMENDATIONS:  1.  Continued medical therapy will be recommended.  2.  GI evaluation would be worthwhile at this point.       TDS/MEDQ  D:  12/08/2003  T:  12/09/2003  Job:  [id number]

## 2010-05-20 NOTE — Cardiovascular Report (Signed)
   NAMEARDYS, HATAWAY                        ACCOUNT NO.:  [id number]   MEDICAL RECORD NO.:  [id number]                   PATIENT TYPE:  OIB   LOCATION:  2858                                 FACILITY:  [facility]   PHYSICIAN:  [name], M.D. [facility]            DATE OF BIRTH:  September 22, 1945   DATE OF PROCEDURE:  DATE OF DISCHARGE:  09/06/2001                              CARDIAC CATHETERIZATION   DATE OF BIRTH:  06/19/1945   PRIMARY CARE PHYSICIAN:  C. Ulyess Mort, M.D.   PROCEDURE:  Left heart catheterization/coronary arteriography.   INDICATIONS:  Evaluate patient with chest pain and a Cardiolite suggesting  inferior infarct and possible anterolateral ischemia.   DESCRIPTION OF PROCEDURE:  Left heart catheterization was performed via the  left femoral artery. The artery was cannulated using the anterior wall  puncture. A #6 French arterial sheath was inserted via the modified  Seldinger technique. Preformed Judkins and a pigtail catheter were utilized.  The patient tolerated the procedure well and left the lab in stable  condition.   HEMODYNAMICS:  LV 162/28, AO 170/12.   CORONARY ARTERIES:  The left main was normal.   The LAD had proximal 25% stenosis.  The distal vessel had 99% stenosis and  distal diffuse disease.   The circumflex in the AV groove had luminal irregularities. A posterolateral  had diffuse distal disease in a small branch vessel.   The right coronary artery was a very large dominant vessel. There were  proximal and distal stents which were patent with only minor in-stent  luminal irregularities.   LEFT VENTRICULOGRAM:  The left ventriculogram was obtained in the RAO  projection. The EF was 40% with moderate inferior akinesis.   DISTAL AORTOGRAM:  A distal aortogram was obtained secondary to her history  of difficult to control hypertension and questionable renal artery stenosis.  These were patent with only luminal irregularities. There was mild  plaquing  in the distal AO.   CONCLUSION:  Small vessel coronary disease in the distal left anterior  descending and circumflex branch vessels. Mild to moderate reduction in left  ventricular function.    PLAN:  The patient will be managed medically with secondary risk factor  modification.  Symptoms will be treated medically and may be related to her  small vessel disease.                                                 [name], M.D. [facility]    JH/MEDQ  D:  09/06/2001  T:  09/09/2001  Job:  [id number]   cc:   C. Ulyess Mort, M.D.

## 2010-05-20 NOTE — H&P (Signed)
[name], [name]                        ACCOUNT NO.:  [id number]   MEDICAL RECORD NO.:  [id number]                   PATIENT TYPE:  INP   LOCATION:  4743                                 FACILITY:  [facility]   PHYSICIAN:  Madolyn Frieze. Jens Som, M.D. [facility]         DATE OF BIRTH:  10/14/1945   DATE OF ADMISSION:  08/29/2001  DATE OF DISCHARGE:  08/30/2001                                HISTORY & PHYSICAL   HISTORY OF PRESENT ILLNESS:  The patient is a 65 year old female with a past  medical history of coronary disease, diabetes mellitus, hypertension,  hyperlipidemia, diverticulitis, GI bleed, who presents with chest pain.  The  patient has had prior inferior myocardial infarction in July 2002.  She had  PCI of the right coronary artery at that time.  She had repeat  catheterization in November 2002, secondary to chest pain.  She was found to  have a normal left main.  The LAD had a 30% proximal lesion as well as mid  LAD.  There was also a 90% stenosis at the distal portion of the LAD.  There  was no significant disease in the circumflex.  The right coronary artery  showed a less than 20% stenosis at the stent site.  Her ejection fraction  was 30%, and there was 2+ mitral regurgitation.  Since that time she had  done reasonably well, with no exertional chest pain.  She denies any dyspnea  on exertion, orthopnea, PND, pedal edema, palpitations, presyncope, or  syncope.  This past Monday she began complaining of pain under her right  breast that was described as a pressure.  There was no radiation to the  pain, and it was not pleuritic.  It did increase with eating and also with  lying flat.  There was diaphoresis, but there was no shortness of breath,  and no nausea or vomiting.  Her symptoms continued, and she presented to the  emergency room this afternoon.   PRESENT MEDICATIONS:  1. Norvasc 10 mg p.o. q.d.  2. Trazodone 150 mg p.o. q.h.s.  3. Lipitor 20 mg p.o. q.h.s.  4. Coreg 50  mg p.o. b.i.d.  5. Altace 10 mg p.o. b.i.d.  6. Insulin.  7. Zelnorm 10 mg p.o. b.i.d.  8. K-Dur 10 mEq p.o. b.i.d.  9. Aspirin 325 mg p.o. q.d.  10.      Sublingual nitroglycerin p.r.n.   PAST MEDICAL HISTORY:  1. Coronary artery disease as outlined above.  2. History of diabetes mellitus.  3. Hypertension.  4. Hyperlipidemia.  5. History of diverticulitis.  6. Irritable bowel syndrome.  7. History of depression.  8. Status post hysterectomy, tonsillectomy, and knee surgery.   SOCIAL HISTORY:  She does smoke, but she does not consume alcohol.   FAMILY HISTORY:  Positive for coronary artery disease.   REVIEW OF SYSTEMS:  She denies any headaches or fevers or chills.  There is  no productive cough or  hemoptysis.  There is no dysphagia, odynophagia,  melena, hematochezia.  There is no dysuria or hematuria.  There is no rash  or seizure activity.  There are no acholic stools. The remaining systems are  negative.   PHYSICAL EXAMINATION:  VITAL SIGNS:  Blood pressure 131/77, pulse 80.  She  is afebrile.  GENERAL:  Well-developed, well-nourished.  In no acute distress.  SKIN:  Warm and dry.  HEENT:  Unremarkable, with no __________.  NECK:  Supple.  __________ bilaterally, and there are no bruits noted.  There is no jugular venous distention.  No thyromegaly noted.  CHEST:  Clear to auscultation with normal expansion.  CARDIOVASCULAR:  Regular rate and rhythm.  With normal S1 and S2.  There is  a 1-2/6 systolic ejection murmur at the left sternal border.  There is no S3  or S4.  ABDOMEN:  Not tender or distended.  Positive bowel sounds.  No  hepatosplenomegaly.  No masses appreciated.  There is no abdominal bruit.  Of note, she is tender to palpation over the right chest area and states  this is the pain that caused her to present to the emergency room.  EXTREMITIES:  Showed no edema that I can palpate.  No cords.  She has 2+  dorsalis pedis pulses bilaterally.  NEUROLOGIC:   Grossly intact.   LABORATORY DATA:  Chest x-ray shows no acute disease.   Hemoglobin 12.6, with hematocrit of 37.1.  Her platelet count is 109, and  her white blood cell count is normal at 7.9.  her BUN and creatinine are 10  and 0.9.  Her enzymes are negative x 1.   Her electrocardiogram shows normal sinus rhythm at a rate of 73.  The axis  is normal.  There is a prior inferior infarct and nonspecific ST changes.   DIAGNOSES:  1. Atypical chest pain.  2. History of coronary disease, status post inferior infarct and     percutaneous coronary intervention of the right coronary artery.  3. Diabetes mellitus.  4. Hypertension.  5. Hyperlipidemia.  6. History of gastrointestinal bleed.   PLAN:  The patient presents for evaluation of chest pain.  Her symptoms are  somewhat atypical and seem most consistent with either a GI etiology (they  do increase with lying flat and after eating) versus a musculoskeletal  etiology (the symptoms all reproduce with palpation in the emergency room).  We will admit and rule out myocardial infarction with serial enzymes.  We  will continue with her present medications including aspirin, Altace, and  Coreg.  If her enzymes are negative, then she will require a Cardiolite for  risk stratification.  We will also begin Protonix for the possibility of  reflux and treat with Tylenol for possible musculoskeletal etiology.  Of  note, she did have a GI bleed in March 2003, and we will, therefore, avoid  nonsteroidals at this point.                                               Madolyn Frieze Jens Som, M.D. [facility]    BSC/MEDQ  D:  08/29/2001  T:  08/31/2001  Job:  [id number]

## 2010-05-20 NOTE — Cardiovascular Report (Signed)
[place]  Patient:    KAHLYN, SHIPPEY Visit Number: [id number] MRN: [id number]          Service Type: Attending:  Daisey Must, M.D. [facility] Dictated by:   Daisey Must, M.D. [facility] Proc. Date: 09/07/00   CC:         Lowella Bandy, M.D., [facility]  [name], M.D. [facility]  Cardiac Catheterization Laboratory   Cardiac Catheterization  PROCEDURES PERFORMED:  Right and left heart catheterization with coronary angiography and left ventriculography.  INDICATIONS:  The patient is a 65 year old woman with a history of previous inferior wall myocardial infarction.  She was treated with stent placement x2 in the right coronary artery in July of this year.  She presented to the hospital with chest pain and congestive heart failure.  After stabilization, she was referred for a cardiac catheterization.  DESCRIPTION OF PROCEDURE:  An 8 French sheath was placed in the right femoral vein, 6 French sheath in the right femoral artery.  Right heart catheterization was performed with a Swan-Ganz catheter.  Left heart catheterization was performed with Standard Judkins 6 French catheters. Contrast was Omnipaque.  There were no complications.  RESULTS:  HEMODYNAMICS:  Right atrial mean pressure 5.  Right ventricular pressure 30/6, pulmonary artery pressure 30/20, pulmonary capillary wedge mean pressure 12. Left ventricular pressure 158/16, aortic pressure 156/98.  There was no aortic valve gradient.  Cardiac output by the thermodilution method is 4.1, cardiac index 1.9.  LEFT VENTRICULOGRAM:  There is severe akinesis of the inferior and apical walls.  Ejection fraction calculated at 36%.  There is 1+ mild mitral regurgitation.  CORONARY ARTERIOGRAPHY:  (Right dominant).  Left main:  Normal.  Left anterior descending:  The left anterior descending artery has a 30% stenosis in the proximal vessel, 40% stenosis in the distal  vessel.  The apical LAD has a 95% stenosis just beyond a small diagonal branch.  The vessel is very small at this point being 1.5 mm or less in diameter.  There is a normal sized first diagonal arising from the proximal LAD which has a 40% stenosis.  There is a small second diagonal arising from the apical LAD which has an 80% stenosis.  Left circumflex:  Left circumflex is a relatively small vessel.  There is a 30% stenosis in the mid circumflex.  The circumflex gives rise to a small OM, small OM-2 and a normal sized OM-3.  Right coronary artery:  The right coronary artery is a very large super dominant vessel.  There is a 20% stenosis in the proximal vessel.  There is a stent in the proximal right coronary artery with less than 10% stenosis within the stent.  Just beyond the stent is a 30% stenosis.  In the mid vessel at the acute margin is a 50% stenosis.  In the distal vessel there is another stent which is widely patent with less than 10% stenosis at the stent site in the distal vessel.  The distal right coronary artery gives rise to a large posterior descending artery, large first and second posterolateral branch and a small third posterolateral branch.  IMPRESSIONS: 1. Normal right and left heart filling pressures with normal pulmonary    artery pressure. 2. Moderately decreased cardiac output and moderately decreased left    ventricular systolic function secondary to previous inferoapical wall    myocardial infarction. 3. Residual one-vessel coronary artery disease characterized by a 95%    stenosis in the apical left  anterior descending which is a very small    vessel at that point.  There are patent stent sites in the right coronary    artery with residual nonobstructive disease elsewhere.  PLAN:  Medical therapy. Dictated by:   Daisey Must, M.D. [facility] Attending:  Daisey Must, M.D. [facility] DD:  09/07/00 TD:  09/07/00 Job: [id number]

## 2010-05-20 NOTE — Assessment & Plan Note (Signed)
[facility]                              CARDIOLOGY OFFICE NOTE   NAME:[name], [name]                     MRN:          [id number]  DATE:09/27/2005                            DOB:          [birth year]    PRIMARY CARE PHYSICIAN:  C. Ulyess Mort, M.D.   REASON FOR PRESENTATION:  Evaluate patient with coronary disease and  dyspnea.   HISTORY OF PRESENT ILLNESS:  Patient presents for follow-up.  At the last  visit, she was unfortunately back to smoking cigarettes.  She has since quit  again.  She has seen Dr. Craige Cotta who has changed her pulmonary medications.  She says her dyspnea is improved.  She still gets short of breath doing  moderate activity but this is better than it  been.  She is not getting any  resting shortness of breath, denies any PND or orthopnea.  She has had no  chest discomfort.  She is very proud of herself for stopping smoking.   The patient did have a stress perfusion study because of the dyspnea.  This  was done in June and demonstrated inferolateral scar with some peri-infarct  ischemia.  EF was approximately 50% as it had been before.   ALLERGIES:  PENICILLIN and CODEINE.   MEDICATIONS:  1. Aspirin 81 mg per day.  2. Spiriva.  3. Lasix 40 mg b.i.d.  4. Caduet 10/20 daily.  5. K-Dur.  6. Metformin 500 mg b.i.d.  7. Altace 10 mg per day  8. Trazodone.  9. Protonix 40 mg per day.  10.Coreg 50 mg b.i.d.  11.Hydroxyzine.  12.Effexor 300 mg a day.   PAST MEDICAL HISTORY:  1. Coronary artery disease (see the March 28, 2005, note for details).  2. Trace mitral regurgitation.  3. Mildly reduced ejection fraction (approximately 50%).  4. Diabetes mellitus.  5. Dyslipidemia.  6. Chronic obstructive pulmonary disease.  7. Laparoscopic cholecystectomy.   REVIEW OF SYSTEMS:  As stated in the HPI and otherwise negative for other  systems.   PHYSICAL EXAMINATION:  GENERAL APPEARANCE:  The patient is in no distress.  VITAL SIGNS:  Blood pressure 110/70, heart rate 86 and regular, weight 227  pounds.  NECK:  No jugular venous distension, wave form within normal limits, carotid  upstroke brisk and symmetric, no bruits, no thyromegaly.  LYMPHATICS:  No adenopathy.  LUNGS:  Clear to auscultation bilaterally.  BACK:  No costovertebral angle tenderness.  CHEST:  Unremarkable.  CARDIOVASCULAR:  PMI not displaced or sustained.  S1 and S2 within normal  limits.  No S3, no S4, no murmurs.  ABDOMEN:  Obese, positive bowel sounds, normal frequency and pitch.  No  bruits, no rebound, no guarding, no midline pulsatile mass, no organomegaly.  SKIN:  No rashes, no nodules.  EXTREMITIES:  Pulses 2+, no edema.   ASSESSMENT/PLAN:  1. Dyspnea.  This is improved.  Does not appear to be a cardiac etiology.      She will follow with her pulmonologist.  No further cardiovascular      testing is suggested.  She will continue with secondary to risk  reduction.  2. I combined her medications to try to save her a little money.  She      ended up on a lower dose of Lipitor 20 instead of 40 with her Caduet.      I am going to check a fasting lipid profile and she will remain on this      if she is at target.   FOLLOW UP:  I will see her back in six months and perhaps yearly if she is  doing well.            ______________________________  [name], MD, [facility]     JH/MedQ  DD:  09/27/2005  DT:  09/29/2005  Job #:  [id number]   cc:   C. Ulyess Mort, M.D.

## 2010-05-20 NOTE — Consult Note (Signed)
[name], [name]            ACCOUNT NO.:  [id number]   MEDICAL RECORD NO.:  [id number]          PATIENT TYPE:  INP   LOCATION:  4703                         FACILITY:  [facility]   PHYSICIAN:  Arturo Morton. Riley Kill, M.D. [facility] OF BIRTH:  [birth year]   DATE OF CONSULTATION:  12/07/2003  DATE OF DISCHARGE:                                   CONSULTATION   Cardiologist is [name], M.D.   CHIEF COMPLAINT:  Chest pain.   HISTORY OF PRESENT ILLNESS:  [name] is a 65 year old female who has a  known history of coronary artery disease.  She has presented now with left  inframammary pain as well as abdominal pain.  She last underwent  catheterization at 2003, at which time she had nonobstructive disease.  She  has had previous stenting of the right coronary artery with two NIR stents.  This was done in 2002.  With regard to the symptoms, she says that they are  somewhat similar to what she had when she had her acute coronary event.  The  pain occurs in the left inframammary area and is not associated with  inspiration and does not radiate.  It is associated with some sweating and  diaphoresis.  She has also had diverticulosis and has had some left  periumbilical pain.  The abdominal films apparently show constipation.  She  has also had nausea.  Serial enzymes have been negative so far.   ALLERGIES:  PENICILLIN and CODEINE.   MEDICATIONS:  1.  Lovenox b.i.d.  2.  IV nitroglycerin  3.  Protonix 40 mg IV daily.  4.  Sliding scale insulin.  5.  Lasix 120 mg daily.  6.  Potassium chloride 40 mEq daily.  7.  Lipitor 20 mg q.h.s.  8.  Effexor 150 mg b.i.d.  9.  Coreg 50 mg b.i.d.  10. Altace 10 mg b.i.d.  11. Enteric-coated aspirin 325 mg daily.  12. Norvasc 10 mg daily.  13. Miralax daily.   PAST MEDICAL HISTORY:  1.  Diabetes treated with insulin.  2.  She has hypertension, which is under treatment, as well as dyslipidemia.  3.  She has coronary artery disease with a  known ejection fraction of 40%.  4.  There is a history of obstructive sleep apnea.  5.  She had a GI bleed in 2004, thought to be due to a diverticular source.  6.  There is a history of pancreatitis in 1999.  7.  She has had TAH and BSO.  8.  Depression.  9.  Iron-deficiency anemia.  10. Obesity.   SOCIAL HISTORY:  The patient lives in [place] with her husband.  She is  married and has no children.  She has a half-pack per day history for 40  years.  She does not drink alcohol.  She uses a diabetic diet.   Mother died at 79 of hypertension and stroke.  Father died at 79.  She had  one brother who died of alcohol.  She has five siblings who are alive.   REVIEW OF SYSTEMS:  The major cause for admission is chest  pain is which is  inframammary.  She has had some abdominal pain associated with some nausea.  She denies any hematemesis or melena.  Review of systems is otherwise  negative.   PHYSICAL EXAMINATION:  GENERAL:  She is an alert and oriented female.  She  was complaining of mild chest discomfort that was relieved by increasing her  nitroglycerin.  VITAL SIGNS:  Temperature is 97.9, pulse 97, respiratory rate 21, and blood  pressure 117/76.  She has a saturation of 96% on room air.  NECK:  There is no obvious jugular venous distention, no carotid bruits are  appreciated.  CHEST:  The lungs are clear to auscultation and percussion.  CARDIAC:  Cardiac rhythm is regular.  The PMI is not displaced.  Normal  first and second heart sounds without obvious murmur.  ABDOMEN:  There is some discomfort in the left upper quadrant, and her bowel  sounds are present.  EXTREMITIES:  No edema.  NEUROLOGIC:  Nonfocal.   EKG reveals sinus tachycardia with inferior infarct of indeterminate age,  otherwise unremarkable.   Hemoglobin 13.4, hematocrit 38.7, white count 7600, platelet count 202.  BUN  6, creatinine 0.7, potassium 3.3.  BNP is 41.4.  TSH is 2.2.  D-dimer 0.5.    IMPRESSION:  1.  Recurrent chest pain, atypical but similar to prior acute coronary      syndrome presentation.  2.  Abdominal pain, question related to constipation.  3.  Hypertension.  4.  Insulin-dependent diabetes.  5.  Dyslipidemia.  6.  History of depression.  7.  History of diverticulosis.  8.  History of diverticular bleed.  9.  Hypokalemia.   PLAN:  At the present time the patient's symptoms are similar.  She has not  had an acute coronary event.  Repeat catheterization has been suggested.  She will need to have likely a GI workup.  We also will need to find out  more about her GI situation.  The patient did have some chest pain while we  were examining her, and it was relieved by increasing the nitroglycerin.       TDS/MEDQ  D:  12/07/2003  T:  12/08/2003  Job:  [id number]   cc:   [name], M.D.

## 2010-05-20 NOTE — Discharge Summary (Signed)
NAMEELAJAH, [name]            ACCOUNT NO.:  [id number]   MEDICAL RECORD NO.:  [id number]          PATIENT TYPE:  INP   LOCATION:  4737                         FACILITY:  [facility]   PHYSICIAN:  Clent Demark, M.D.   DATE OF BIRTH:  1945/06/23   DATE OF ADMISSION:  06/02/2005  DATE OF DISCHARGE:  06/06/2005                                 DISCHARGE SUMMARY   DISCHARGE DIAGNOSES:  1.  Shortness of breath felt secondary to COPD exacerbation.  2.  Chronic obstructive pulmonary disease.  3.  Prolonged QT on EKG, resolved on peak EKG.  4.  History of myocardial infarction with two stents in 2002.  5.  Catheterization 2005 with nonobstructive disease followed by Dr.      Antoine Poche.  6.  Hypertension.  7  Type II diabetes.  1.  Cardiomyopathy.  2.  Congestive heart failure, ejection fraction 45-55%.  3.  Gastroesophageal reflux disease.  4.  Irritable bowel syndrome for chronic constipation.  5.  Anxiety/insomnia.  6.  Obstructive sleep apnea status uvuloplasty and bilateral turbinate      reduction.  7.  History of tobacco abuse with recent restarting one week prior to      admission.  8.  History of heavy alcohol use.  9.  History of diverticular bleed February 2005.  10. History of hysterectomy.  11. History of tonsillectomy.  12. History of cholecystectomy.  13. Gastritis and hiatal hernia, EGD 2006.   DISCHARGE MEDICATIONS:  1.  Avelox 400 mg p.o. daily x10 more days.  2.  Prednisone 50 mg x2 days, 30 mg x2 days, 10 mg x2 days.  3.  Albuterol MDI.  4.  Atrovent MDI.  5.  Lantus 4 units q.h.s.  6.  Sliding scale insulin 7 units q.a.c.  7.  Metformin 500 mg p.o. b.i.d.  8.  Altace 10 mg p.o. daily.  9.  Coreg 6.25 mg p.o. b.i.d.  10. Caduet 10/40 mg p.o. daily.  11. Lasix 40 mg daily.  12. Protonix 40 mg daily.  13. Effexor 150 mg daily.  14. K-Dur 20 mEq p.o. daily.  15. Aspirin 81 mg daily.   CONDITION ON DISCHARGE:  Improved with resolution of shortness of breath  and  wheezing.  The patient will complete a course of antibiotics and a steroid  taper and follow up with her primary care physician, Dr. Aundria Rud at [facility] on June 13, at 2:00 p.m.   PROCEDURES:  1.  June 03, 2005, showed a trache bronchitis.  No active lung disease.  Mild      cardiomegaly.  2.  June 05, 2005, __________ on the abdomen status post cholecystectomy.  No      acute findings, although, pancreas is suboptimally visualized due to      overlying bowel gas.   HISTORY OF PRESENT ILLNESS:  [name] is a 65 year old African  American female with history of COPD, CHF, status post MI with two stents,  cardiomyopathy __________ shortness of breath.  She was at home sleeping  when she awoke suddenly with shortness of breath.  She states she  had had  dyspnea x1 week associated with cough production with yellow sputum.  She  states she just started smoking cigarettes about one week ago after having  quit for some time.  She had some associated chest pain with the cough.  Describes the pain as substernal and under the left breast to her throat and  her left leg.  Chest pain occurs at rest but is worse with exertion.  Chest  pain is 9/10 at worst and remained 9/10 in severity upon evaluation in the  emergency department.  Episodes of chest pain lasted about 20-30 minutes and  are associated with coughing spells.  She describes the pain as burning and  tightness.  She denies any recent travel, sick contacts, nausea, vomiting,  diarrhea, constipation, urinary complaints of fever or chills.   PHYSICAL EXAMINATION:  VITAL SIGNS:  Afebrile, blood pressure 112/64, pulse  104, oxygen saturation in the 80's initially then 100% on BiPAP.  GENERAL APPEARANCE:  No acute distress with occasional coughing spells.  No  conversational dyspnea.  HEENT:  Eyes:  Pupils equal, round and reactive to light.  Extraocular  movements intact.  No icterus.  ENT:  Oropharynx was clear  with dry mucous  membranes.  BiPAP in place.  NECK:  Supple.  No lymphadenopathy.  LUNGS:  Course breath sounds bilaterally.  The patient was noted on initial  evaluation in the emergency department to have wheezing.  CARDIOVASCULAR:  Regular rate and rhythm.  ABDOMEN:  Obese, nontender, positive bowel sounds.  EXTREMITIES:  No cyanosis, clubbing or edema.  Mild to bilateral calf  tenderness.  NO palpable cords.  Negative Homan's signs.  GU:  No CVA tenderness.  SKIN:  Warm and dry.  NEUROLOGICAL:  Nonfocal.   LABORATORY DATA:  White count 9.0, hemoglobin 12.4, hemoglobin 12.4,  platelets 314,000, ANC 5.9, MCV 86.1.  Sodium 136, potassium 3.6, chloride  100, bicarbonate 29, BUN 10, creatinine 0.9, glucose 357.  Troponin I on  point of care markers were negative x3.  BNP 44.  ABG showed pH 7.3, PCO2  59, PO2 531 on BiPAP.   HOSPITAL COURSE:  #1 - SHORTNESS OF BREATH.  The patient was initially  evaluated by Cardiology due to complaint of chest pain.  However, it was  felt that her respiratory etiology was the most likely cause of her  symptoms.  Acute MI was ruled out by enzymes and EKG.  She improved  significantly on BiPAP in the emergency department with resolution of  wheezing.  She started on steroids and antibiotics for COPD exacerbation  along with nebulizers.  Chest x-ray did not show evidence of an infiltrate,  and she remained afebrile without leukocytosis on admission.  According to  the patient, she has a pulmonologist who has performed PFC's.  However,  these records are unavailable at this time.  The patient responded very well  to treatment, and at the time of discharge was saturating well on room air.  She will be sent home to complete a course of Avelox for a total of 10 days  along with the steroid taper.  She will follow up with her primary care  physician, Dr. Aundria Rud in one week.  She will continue to use Albuterol and Atrovent nebulizers p.r.n.  At, follow up, it  can be decided whether to add  Advair/Spiriva to her regimen.   #2 - TYPE 2 DIABETES.  The patient's blood sugars were uncontrolled during  her hospitalization due to acute illness and  high dose steroids.  Glycemic  control was improved and steroids were tapered.  She will be continued on  her outpatient diabetic regimen which can be adjusted by her primary care  physician.   #3 - PROLONGED QT - The patient had initial EKG's which showed prolonged QT  with a QTC elevated at a maximum of 526, persisted on several EKG's.  However, repeat EKG later showed a normal QT, at which point it was felt  safe to restart the patient's Effexor.  Recommend follow up EKG.  Of note,  outpatient to ensure resolution of prolonged QT.   DISCHARGE LABORATORIES/VITAL SIGNS:  Temperature 98.8, blood pressure  138/85, pulse 92, respirations 20, oxygen saturation 95% on room air.  White  cell count 10.8, hemoglobin 12.3, platelets 272,000.  Sodium 142, potassium  3.4, chloride 102, bicarbonate 35, BUN 12, creatinine 0.9, glucose 301.  The  patient received potassium and magnesium supplementation prior to discharge.  Her home dose of Coreg was reduced to 26.25 mg b.i.d.  This does controlled  blood pressure in the hospital.  Her Lasix also was reduced to 40 mg daily.  A B-met will be obtained at follow up visit.      Clent Demark, M.D.     Verlin Grills  D:  06/06/2005  T:  06/07/2005  Job:  [id number]   cc:   Aundria Rud, M.D.  Outpatient Clinic

## 2010-05-20 NOTE — Cardiovascular Report (Signed)
[place]  Patient:    DOMINIQUE, RESSEL Visit Number: [id number] MRN: [id number]          Service Type: MED Location: [id number] Attending Physician:  [name] Dictated by:   Everardo Beals Juanda Chance, M.D. [facility] Proc. Date: 11/12/00 Admit Date:  11/09/2000 Discharge Date: 11/13/2000   CC:         [name], M.D. [facility]  C. Ulyess Mort, M.D.  [facility]  Cardiopulmonary Laboratory   Cardiac Catheterization  PROCEDURES PERFORMED: Right and left heart catheterization.  CLINICAL HISTORY: The patient is 65 years old and has diabetes, hyperlipidemia and hypertension, and had a DMI in July, which was treated late with stenting of the right coronary artery with Dr. Gerri Spore with the use of an AngioJet. Her ejection fraction was initially 30% and she had 3+ MR but subsequently her ejection fraction by echocardiogram improved to 50% with 2+ MR. She recently developed symptoms of exertional shortness of breath and chest tightness and was brought in for a repeat catheterization.  DESCRIPTION OF PROCEDURE: The procedure was performed via the right femoral artery and right femoral vein using arterial and venous sheaths. A front wall arterial puncture was performed and Omnipaque contrast was used. A Swan-Ganz was passed via the right femoral vein to the pulmonary artery. Coronary angiography was performed with preformed coronary catheters. The patient tolerated the procedure well and left the laboratory in satisfactory condition.  RESULTS: The left main coronary artery: The left main coronary artery was free of significant disease.  Left anterior descending: The left anterior descending artery gave rise to three diagonal branches and two septal perforators. There were tandem 30% stenoses in the proximal and mid left anterior descending artery. There was 90% stenosis at the very distal portion of the LAD.  Circumflex  artery: The circumflex artery gave rise to a large and small marginal branch and atrial branch and a posterolateral branch. These vessels were free of significant disease.  Right coronary artery: The right coronary is a very large dominant vessel that gave rise to a right ventricular branch, a posterior descending branch and two very large posterolateral branches which fed the apex of the left ventricle. There was less than 20% stenosis at the stent site in the proximal right coronary artery. There was 0% stenosis at the stent site in the distal right coronary artery.  LEFT VENTRICULOGRAPHY: The left ventriculogram performed in the RAO projection showed akinesis of the inferobasal wall and apex.  Only the anterolateral wall.  The overall wall and anterobasilar wall moved well. The estimated ejection fraction was 30%. There was 2+ mitral regurgitation.  HEMODYNAMIC DATA: The right atrial pressure was 7 mean. The right ventricular pressure was 44/7, the pulmonary artery pressure was 44/26 with a mean of 34. Pulmonary wedge pressure was 24 mean with a ______ of 30. LV pressure was 150/26. The aortic pressure was 150/100. Cardiac output/cardiac index was 5.1/2.4 L/min. per sq m by thermodilution.  CONCLUSIONS: 1. Coronary artery disease, status post prior diaphragmatic wall infarction    treated late with a stent to the proximal and distal right coronary artery    with 30% stenosis in the proximal and mid left anterior descending artery,    and 90% stenosis in the very distal left anterior descending artery, no    major obstruction in the circumflex artery, less than 20% narrowing at the    stent site in the proximal right coronary artery and 0% narrowing  at the    stent site in the distal right coronary artery with moderately severe left    ventricular dysfunction with inferior wall akinesis and apical wall    akinesis. 2. Moderate or 2+ mitral regurgitation with elevated wedge pressure  of 24.  RECOMMENDATIONS: The patient has no clear source of ischemia. I doubt that the very distal lesion in the LAD is causing any problems. I suspect that her main problem is related to her elevated filling pressures which appears to be due to a combination of left ventricular dysfunction and moderate mitral regurgitation. We will plan continued medical therapy and will give additional Lasix now and increased her maintenance Lasix dosage. Dictated by:   Everardo Beals Juanda Chance, M.D. [facility] Attending Physician:  [name] DD:  11/12/00 TD:  11/13/00 Job: [id number]

## 2010-05-20 NOTE — Assessment & Plan Note (Signed)
[facility]                             PULMONARY OFFICE NOTE   NAME:[name], [name]                   MRN:          [id number]  DATE:03/20/2006                            DOB:          September 13, 1945    I saw [name] today in followup for her COPD and dyspnea as well  as sleep disturbance.   I had last seen her in August 2007. She states that her symptoms of  dyspnea has improved to some degree with the institution of Spiriva.  However, she was confused with the inhaler regimen and actually has been  using Spiriva with Atrovent as a rescue inhaler. She states that she has  been off her cigarettes since her last visit.   She is still having difficulty with her sleep at night saying that she  snores quite loudly and actively wakes herself up snoring.   She also continues to have dyspnea with exertion saying that she gets  tired and short of breath after walking 100-200 feet which has gotten  somewhat worse since she had a recent sinus infection but she feels that  her sinus infection is improving.   CURRENT MEDICATIONS:  1. Metformin 500 mg b.i.d.  2. NovoLog insulin.  3. Lantus insulin.  4. Altace 10 mg daily.  5. Coreg 25 mg 2 tablets b.i.d.  6. Caduet 10/20 daily.  7. Lasix 40 mg b.i.d.  8. Aspirin 81 mg daily.  9. Protonix 40 mg daily.  10.Effexor 100 mg daily.  11.Spiriva 1 puff daily.  12.Nasacort 2 puffs daily.  13.Estrace 3 times a week.  14.Trazodone 150 mg q.h.s. which she says is not really helping her      with her sleep.  15.Hydroxy 25 mg b.i.d.   PHYSICAL EXAMINATION:  VITAL SIGNS:  She is 230 pounds, temperature  98.3, blood pressure 120/84, heart rate is 87. Oxygen saturation is 94%  on room air.  HEENT:  There is no sinus tenderness. She has a Mallampati 4 airway.  There is no lymphadenopathy.  HEART:  S1, S2.  CHEST:  Decreased breath sounds. Prolonged expiratory phase but no  wheezing.  ABDOMEN:  Obese, soft  and tender.  EXTREMITIES:  No edema.   IMPRESSION:  1. Dyspnea on exertion. This is likely multifactorial related to her      underlying chronic obstructive pulmonary disease as well as her      deconditioning and obesity. I will have her undergo repeat      pulmonary function test and I will make arrangements for her to      undergo pulmonary rehab to improve her exercise tolerance. I will      continue her Spiriva. I will discontinue her Atrovent and I have      given her a sample and prescription for ProAir HFA which she is to      use 2 puffs q.i.d. as needed. If she finds that she needs to use      this on a regular basis then she may need to have augmentation of      her inhaler regimen. I  have again encouraged her to maintain her      smoking cessation.  2. Obesity with sleep disturbance. I again am quite concerned that she      may have some degree of underlying sleep apnea. She is now      agreeable to have this further evaluated with an overnight      polysomnogram. Depending upon the results of this, I would initiate      her on appropriate therapy. Additionally after review of her sleep      study, we may need to readdress the need for her sleep aid      medication.   I will follow up with her in approximately 6 weeks.     [name], MD  Electronically Signed    VS/MedQ  DD: 03/20/2006  DT: 03/20/2006  Job #: [id number]   cc:   C. Ulyess Mort, M.D.  [name], MD, [facility]

## 2010-05-20 NOTE — Discharge Summary (Signed)
[place]. [facility]  Patient:    [name], [name] Visit Number: [id number] MRN: [id number]          Service Type: MED Location: [id number] Attending Physician:  [name] Dictated by:   Jacolyn Reedy, P.A.C. Admit Date:  11/09/2000 Discharge Date: 11/13/2000   CC:         Dr. Aundria Rud   Discharge Summary  MAIN DIAGNOSIS:  Chest pain and shortness of breath, rule out ischemia.  DISCHARGE DIAGNOSES:  1. Dyspnea on exertion, probably secondary to increase left ventricular     filling pressure secondary to left ventricular dysfunction and mitral     regurgitation.  2. No restenosis of the prior stent sites in the right coronary artery.  3. Status post diaphragmatic myocardial infarction treated with stent to the     right coronary artery x 2 on 07/09/00.  4. Left ventricular dysfunction, ejection fraction 30%.  5. Anemia with guaiac negative stools.  6. Chronic obstructive pulmonary disease, quit smoking two months ago.  7. Insulin-dependent diabetes mellitus.  8. Treated hyperlipoproteinemia.  9. Hypertension. 10. Prior history of polysubstance abuse.  BRIEF HISTORY AND PHYSICAL:  Please see H&P for details.  Ms. [name] is a 65 year old African-American female, who had DMI in 07/02, treated with stent to the RCA.  Initially she had an ejection fraction of 30% with 2+ MR, but ultimately this improved following intervention to an ejection fraction of 50% with 2+ MR.  She now presents with recurrent dyspnea on exertion, 2-3 pillow orthopnea, and chest tightness.  She was admitted to rule out ischemia.  She also has underlying COPD, and PFQs done by [name] revealed moderate obstructive defect and moderate restriction with significant response to bronchodilator therapy.  She is scheduled to see Dr. Sung Amabile in the office.  HOSPITAL COURSE:  The patient was admitted.  She did diurese and was feeling much better with IV Lasix.  She  underwent cardiac catheterization by Dr. Charlies Constable on 11/12/00 without difficulty.  This revealed 30% proximal LAD, 90% distal LAD, normal circumflex, less than 20% proximal RCA at the stent site, and 0% distal stent site.  We showed inferior and apical akinesis, ejection fraction 30% with 2+ MR.  Right heart cath revealed an RA pressure of 7, RV of 44/7, PA pressure of 44/26 average of 34, pulmonary artery wedge pressure of 24 V=30, LV was 150/26, aorta 150/100, cardiac output 5.1, cardiac index 2.4.  There was no source of ischemia.  Dr. Juanda Chance felt her symptoms are secondary to her increased LV filling pressure secondary to LV dysfunction and MR.  He increased her Lasix and recommended ongoing medical therapy.  The patient was feeling better.  She said she still had some dyspnea on exertion, but it was improved from when she was admitted, as she had no further chest pain.  She also had an anemia on admission.  Her initially hemoglobin was 8.3, repeat was 9.2 and prior to discharge, hemoglobin was 9.7, hematocrit 29.  Her stools were guaiac negative.  Iron was normal at 42.  Her saturation was low at 13.  Her folate and ferritin were also normal.  She will follow up on her anemia with Dr. Aundria Rud.  Right groin is stable without hematoma or hemorrhage, and she had good distal pulses, and she is discharged home in stable condition.  LABORATORY VALUES:  On admission, white count 6.7, hemoglobin 8.3, hematocrit 25, platelets 310.  Prior to discharge, white count 6.8, hemoglobin  9.7, hematocrit 29, platelets 414.  Coagulation times were normal.  Sodium 142, potassium 4.2, chloride 103, CO2 30, BUN 15, creatinine 1.1.  Stool were guaiac negative.  Iron was 42, TIBC 318, saturations low at 13, B12 high at 1143, folate normal, ferritin normal.  Chest x-ray is not in the computer. EKG normal sinus rhythm, inferior Q waves, no acute changes.  CONDITION ON DISCHARGE:  The patient is discharged  home in stable condition.  DISCHARGE MEDICATIONS:  1. Lasix 80 mg in the morning, 40 mg in the evening.  2. Lipitor 20 mg a day.  3. Coreg 12.5 mg b.i.d.  4. Altace 10 mg b.i.d.  5. Effexor 75 mg in the morning, 2 in the evening.  6. Humulin insulin 70/30, 35 units in the morning, 35 units in the evening.  7. Vistaril 25 mg q.h.s.  8. Trazodone 150 mg q.h.s.  9. Coated aspirin once a day. 10. K-Dur 20 mEq once a day. 11. Nitroglycerin p.r.n.  DISCHARGE INSTRUCTIONS:  She is to do no heavy lifting or strenuous activity for 2-3 days.  She is to follow a low-salt, low-fat diabetic diet.  She has an appointment to see [name] back November 19, at 10:45.  She also has an appointment to see Dr. Sung Amabile and is advised to make an appointment with Dr. Aundria Rud. Dictated by:   Jacolyn Reedy, P.A.C. Attending Physician:  [name] DD:  11/13/00 TD:  11/13/00 Job: [id number]

## 2010-05-20 NOTE — Op Note (Signed)
[place]. [facility]  Patient:    [name], [name]                     MRN: [id number] Proc. Date: 03/08/99 Adm. Date:  [id number] Attending:  Carlean Purl CC:         Kristine Garbe. Ezzard Standing, M.D.             Gary Fleet, M.D.                           Operative Report  PREOPERATIVE DIAGNOSIS: 1. Obstructive sleep apnea. 2. Snoring. 3. Turbinate hypertrophy.  POSTOPERATIVE DIAGNOSIS: 1. Obstructive sleep apnea. 2. Snoring. 3. Turbinate hypertrophy.  OPERATION PERFORMED: 1. Uvulopalatopharyngoplasty. 2. Bilateral inferior turbinate reductions.  SURGEON:  Kristine Garbe. Ezzard Standing, M.D.  ANESTHESIA:  General endotracheal.  COMPLICATIONS:  None.  BRIEF CLINICAL NOTE:  The patient is a 65 year old black female who has a history of insulin-dependent diabetes, hypertension as well as depression.  She has a longstanding history of snoring.  She has undergone sleep tests which demonstrated mild obstructive sleep apnea with a RDI of 15 and decreased REM sleep.  She is taken to the operating room at this time for uvulopalatopharyngoplasty and turbinate reductions.  Of note, she has had previous history of tonsillectomy at an earlier age.  DESCRIPTION OF PROCEDURE:  After endotracheal anesthesia, the nose was examined  first.  The septum was relatively midline.  She had large inferior turbinates with the right side larger than the left causing partial obstruction mostly on the right side.  On the right side, the turbinate was injected with Xylocaine with epinephrine.  Incision was made along the inferior edge of the turbinate and turbinate bone was removed from the right inferior turbinate.  Suction cautery as used to submucosally cauterize the right inferior turbinate and control hemostasis. The remaining posterior turbinate on the right side was outfractured.  This completed the right side.  On the left side, suction cautery was  used to cauterize the inferior aspect of the left inferior turbinate.  The turbinate bone was then outfractured.  This completed the turbinate reductions.  The nasal passages were otherwise clear.  Following this, a mouth gag was used to expose the oropharynx. Of note, she had a very loose bridge of her upper front teeth and care was taken to not dislodge this bridge.  On examination of the oropharynx, she was status post previous tonsillectomy.  She has a very long uvula.  Using cautery, the distal approximately 1 cm of soft palate and uvula was excised at its base.  The mucosal edges on either side of the palate mucosa were then reapproximated with 3-0 Vicryl and 3-0 chromic sutures.  This completed the procedure.  The patient was awakened from anesthesia and transferred to the recovery room postoperatively doing well. Of note, she received 1 gm Ancef and 8 mg of Decadron IV preoperatively.  DISPOSITION:  The patient will be observed overnight in the [facility] and discharged home in the morning if she is taking adequate p.o.s.  In addition, I placed her on Lortab elixir 1 to 1-1/2 tablespoons q.4h. p.r.n. pain and amoxicillin suspension 250 b.i.d. for 10 days.  She was instructed to resume her previous medications.  She will follow up in my office in 10 days to two weeks or recheck. DD:  03/08/99 TD:  03/08/99 Job: [id number]

## 2010-05-20 NOTE — Assessment & Plan Note (Signed)
[facility]                             PULMONARY OFFICE NOTE   NAME:[name], [name] BURCHFIELD                   MRN:          [id number]  DATE:04/11/2006                            DOB:          [birth year]    I saw [name] today in followup for her overnight polysomnogram.   This was done on March 27, 2006.  She had a split-night study.  During  the diagnostic course of the study she was found to have an  apneic/hypopneic index of 15 with an oxygen saturation nadir 80%.   During the therapeutic portion of the study, she was titrated to a CPAP  setting of 10 cm of water with improvement in her respiratory events.   She said that she actually noticed a symptomatic benefit during the  night of the sleep test.  I will start her on CPAP at 10 cm of water  with humidification and mask of choice.   She is also due to have her pulmonary function test done April 19, 2006,  and we will call her with the results of this.   She says with regards to her pulmonary rehab, her insurance will not pay  for it, and it is too expensive for her.  I have, therefore, discussed  with her various exercise regimens that she can do at home to try and  improve her exercise tolerance.   I will follow up with her in approximately 6 to 8 weeks.     [name], MD  Electronically Signed    VS/MedQ  DD: 04/11/2006  DT: 04/11/2006  Job #: [id number]   cc:   C. Ulyess Mort, M.D.  [name], MD, [facility]

## 2010-05-20 NOTE — Discharge Summary (Signed)
   NAMELILLIANNA, [name]                        ACCOUNT NO.:  [id number]   MEDICAL RECORD NO.:  [id number]                   PATIENT TYPE:  INP   LOCATION:  4743                                 FACILITY:  [facility]   PHYSICIAN:  Madolyn Frieze. Jens Som, M.D. [facility]         DATE OF BIRTH:  08/26/1945   DATE OF ADMISSION:  08/29/2001  DATE OF DISCHARGE:  08/30/2001                                 DISCHARGE SUMMARY   CARDIOLOGIST:  [name], M.D. [facility]   PRIMARY CARE PHYSICIAN:  Dr. Aundria Rud   REASON FOR ADMISSION:  Chest pain.   DISCHARGE DIAGNOSES:  1. Chest pain, etiology unknown   Dictation ended at this point.          Pennelope Bracken, P.A. [facility]                   Madolyn Frieze. Jens Som, M.D. [facility]    LK/MEDQ  D:  08/30/2001  T:  08/30/2001  Job:  [id number]

## 2010-05-20 NOTE — Cardiovascular Report (Signed)
. [facility]  Patient:    [name], [name]                     MRN: [id number] Proc. Date: 07/10/00 Adm. Date:  [id number] Attending:  [name] CC:         [name], M.D. [facility]  [facility]  Cardiac Catheterization Laboratory   Cardiac Catheterization  PROCEDURES PERFORMED: 1. Left heart catheterization with coronary angiography and left    ventriculography. 2. AngioJet thrombectomy of the proximal distal right coronary artery. 3. Percutaneous transluminal coronary angioplasty with stent placement in  the    proximal right coronary artery. 4. Percutaneous transluminal coronary angioplasty with stent placement in the    distal right coronary artery. 5. Percutaneous transluminal coronary angioplasty of the mid right coronary    artery.  INDICATIONS:  The patient is a 65 year old woman, who had a prolonged episode of chest pain two days ago.  She presented yesterday with evidence of an out-of-hospital inferior infarction with elevated enzymes and Q waves on the inferior leads.  This morning she had recurrent chest pain.  She was therefore referred for cardiac catheterization.  CATHETERIZATION PROCEDURE:  A 6 French sheath was placed in the right femoral artery.  Standard Judkins 6 French catheters were utilized.  Contrast was Omnipaque.  There were no complications.  RESULTS:  HEMODYNAMICS:  Left ventricular pressure 118/30.  Aortic pressure 118/80. There was no aortic valve gradient.  LEFT VENTRICULOGRAM:  There is severe akinesis of the inferior wall and akinesis of the apical wall.  Ejection fraction is calculated at 30%. There is 2+ mitral regurgitation.  CORONARY ARTERIOGRAPHY:  (Right dominant).  Left main:  Left main has a distal 20% stenosis.  Left anterior descending:  Left anterior descending artery has a 30% stenosis extending from the proximal to the mid vessel across the origin of the  first diagonal branch.  The LAD gives rise to a normal sized first diagonal and a small second diagonal.  Left circumflex:  The left circumflex has a 30% stenosis in the mid vessel. It gives rise to a small OM-1, small OM-2 and normal sized OM-3.  Right coronary artery:  The right coronary artery is a large super dominant vessel.  In the proximal vessel is a diffuse 40% stenosis, followed by 99% stenosis with thrombus and TIMI-1 flow beyond this.  After reperfusion of the right coronary artery, there was found to be a 60% stenosis in the mid vessel and a 90% stenosis in the distal vessel.  The distal right coronary artery gives rise to a large posterior descending artery, a small posterolateral branch and large second and third posterolateral branches.  IMPRESSIONS: 1. Significantly decreased left ventricular systolic function secondary to    an out-of-hospital extensive inferior posterior wall myocardial infarction. 2. One-vessel coronary artery disease characterized by an occlusion of the    right coronary artery with TIMI-1 flow.  PLAN:  Percutaneous intervention of the right coronary artery.  See below.  PERCUTANEOUS TRANSLUMINAL CORONARY ANGIOPLASTY PROCEDURE:  Following the completion of the diagnostic catheterization, we proceeded directly with coronary intervention.  The 6 French sheath in the right femoral artery was exchanged over a wire for a 7 [place] sheath.  A 6 French sheath was placed in the right femoral vein.  The patient was administered intravenous aminophylline to prevent bradycardia.  A temporary pacemaker was placed in the right ventricular apex.  We used a 7 [place] guiding  catheter with side holes.  The patient had been started on Integrilin on admission and this was continued through the procedure.  Heparin was administered to maintain an ACT of greater than 200 seconds.  The lesion in the proximal right coronary artery was successfully crossed with a  Hi-Torque Floppy wire.  This wire was advanced into the distal vessel.  We then performed AngioJet thrombectomy with a 4 French AngioJet device for initially two passes in the proximal vessel.  This did establish TIMI-3 flow into the distal vessel.  In the distal vessel there was a 90% stenosis and it was not clear whether there might be thrombus associated with this.  We therefore performed two passes with the AngioJet of the distal area.  We then pulled back and performed one additional pass of the proximal area with the AngioJet.  Following this, there was still significant residual disease in both the proximal mid and distal vessels.  We therefore preceded with PTCA and stent placement.  We used a 3.0 x 15 mm Quantum balloon and positioned this across the lesion in the distal vessel inflating it to 14 atmospheres.  The same balloon was then positioned across the lesion in the proximal vessel and inflated to 10 atmospheres.  We then deployed a 3.5 x 15 mm NIR stent in the distal vessel at a deployment pressure of 9 atmospheres.  This same stent delivery balloon was reinflated within the stent to 14 atmospheres.  We then deployed a 3.5 x 15 mm NIR stent in the proximal vessel at a deployment pressure of 10 atmospheres.  Following this, we post-dilated the stent in the proximal vessel with a 3.75 x 12 mm Quantum balloon inflated to 16 atmospheres in the distal aspect of the stent, 14 atmospheres in the proximal aspect of the stent.  We then advanced this balloon into the mid vessel across the 60% stenosis and performed two inflations to 14 and then to 18 atmospheres.  Final angiographic images revealed patency of the right coronary artery with 0% residual stenosis in the proximal site, 25% residual stenosis in the mid vessel and 0% residual stenosis in the distal vessel.  There is TIMI-3 flow.  COMPLICATIONS:  None.   RESULTS:  Successful AngioJet thrombectomy followed by PTCA x3 with  stent placement x2 in the right coronary artery.  The proximal vessel was reduced from 99% stenosis with thrombus and TIMI-1 flow to 0% residual with TIMI-3 flow.  The distal vessel was reduced from 90% to 0% residual with TIMI-3 flow. The mid vessel was reduced from 60% to 25% with TIMI-3 flow.  PLAN:  Integrilin will be continued for an additional 24 hours.  Plavix will be administered for four weeks. DD:  07/10/00 TD:  07/11/00 Job: [id number]

## 2010-05-20 NOTE — Procedures (Signed)
NAME:  [name], [name] NO.:  [id number]   MEDICAL RECORD NO.:  [id number]          PATIENT TYPE:  OUT   LOCATION:  SLEEP CENTER                 FACILITY:  [facility]   PHYSICIAN:  [name], MD        DATE OF BIRTH:  October 26, 1945   DATE OF STUDY:  03/27/2006                            NOCTURNAL POLYSOMNOGRAM   REFERRING PHYSICIAN:  [name], MD.   INDICATION FOR STUDY:  This is an individual who has a history of  diabetes, hypertension and heart disease.  She also has sleep disruption  with excessive daytime sleepiness.  She is referred to the sleep lab for  evaluation of hypersomnia with obstructive sleep apnea.   EPWORTH SLEEPINESS SCORE:  12.   MEDICATIONS:  Metformin, insulin, Lantus, Altace, Coreg, Caduet,  furosemide, aspirin, Protonix, Effexor XR, Spiriva, Nasacort, Estrace,  trazodone, hydroxy and Darvocet.  The patient took Metformin, Coreg,  Effexor XR, Darvocet, hydroxypam and trazodone on the night of the  study.   SLEEP ARCHITECTURE:  The patient followed a split night study protocol.  Total recording time was 468.5 minutes.  Total sleep time was 407  minutes.  Sleep efficiency was 87%.  The study was notable for the lack  of slow wave sleep.  Sleep latency was 8.5 minutes which is reduced.  REM latency was 323 minutes which is prolonged.  The patient's slept in  both supine and nonsupine position.  During the diagnostic portion of  this study, the study was notable for lack of REM sleep as well.   RESPIRATORY DATA:  The average respiratory rate was 14.  During the  diagnostic portion of the study the apnea hypopnea index was 15.  The  events were exclusively obstructive in nature.  Moderate snoring was  noted by the technician.  There also was a positional effect.  During  the therapeutic portion of the study, the patient was titrated from a  CPAP pressure setting of 5 to 16 cm of water.  At a CPAP pressure  setting of 10 cm of water, the apnea  hypopnea index was reduced to 6.7.  At this pressure setting, the patient was observed in REM sleep but not  in supine sleep.  Of note is at higher pressure settings, the patient  had more frequent central apneic events.   OXYGEN DATA:  The baseline oxygenation was 96%.  The oxygen saturation  nadir was 80%.  At a CPAP pressure setting of 10 cm of water the mean  oxygenation during non-REM sleep was 94% and mean oxygenation during REM  sleep was 95%.  The minimal oxygenation during non-REM sleep was 87%.  The minimal oxygenation during REM sleep was 93%.   CARDIAC DATA:  The average heart rate was 66 and the rhythm strip showed  normal sinus rhythm.   MOVEMENT-PARASOMNIA:  The periodic limb movement index was 2.4. The  patient had one bathroom trip.   IMPRESSIONS-RECOMMENDATIONS:  With the split night study protocol,  during the diagnostic portion of the study the patient was found to have  an apnea hypopnea index of 15 with an oxygen saturation nadir of 80%.  This would be  consistent with a diagnosis of moderate obstructive sleep  apnea.  During the therapeutic portion of the study, the patient was  titrated to a CPAP pressure setting of 10 cm of water.  The apnea  hypopnea index was reduced to 6.7. At this pressure setting, she was  observed in REM sleep but not supine sleep.  Additionally at this  pressure setting, both sleep architecture and oxygenation appeared to  stabilize.  At high pressure settings, the patient was observed to have  more frequent central apneic events.      [name], MD  Diplomat, American Board of Sleep Medicine  Electronically Signed     VS/MEDQ  D:  03/28/2006 09:47:41  T:  03/28/2006 11:06:46  Job:  [id number]
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
## 2010-05-23 ENCOUNTER — Ambulatory Visit (HOSPITAL_COMMUNITY)
Admission: RE | Admit: 2010-05-23 | Discharge: 2010-05-23 | Disposition: A | Discharge status: Home / Self Care | Payer: Medicare Other | Source: Ambulatory Visit | Attending: Internal Medicine | Admitting: Internal Medicine

## 2010-05-23 DIAGNOSIS — Z1231 Encounter for screening mammogram for malignant neoplasm of breast: Secondary | ICD-10-CM | POA: Insufficient documentation

## 2010-06-28 ENCOUNTER — Ambulatory Visit (INDEPENDENT_AMBULATORY_CARE_PROVIDER_SITE_OTHER): Payer: Medicare Other | Admitting: Gastroenterology

## 2010-06-28 ENCOUNTER — Encounter: Payer: Self-pay | Admitting: Gastroenterology

## 2010-06-28 DIAGNOSIS — K5900 Constipation, unspecified: Secondary | ICD-10-CM

## 2010-06-28 NOTE — Progress Notes (Signed)
Review of pertinent gastrointestinal problems: 1. Chronic constipation: colonoscopy by Dr. Marina Goodell in 2003 and it described diverticulosis and hemorrhoids but no polyps. She had another colonoscopy with Dr. Valinda Party June of 2008 and this described only diverticulosis without polyps. She was recommended at that time to have a repeat colonoscopy in 8 years.    HPI: This is a very pleasant 65 year old woman whom I last saw 6 weeks ago. We essentially discussed her chronic constipation. Since then she has been on dulcolax, miralax twice daily and amitiza twice daily.  She feels like she has incompletely evacuating.       Past Medical History:   DM                                              02/21/2006    DYSLIPIDEMIA                                    04/23/2008    OBESITY NOS                                     04/25/2006    DEPRESSION, RECURRENT, IN PARTIAL REMISSION     03/08/2006     HYPERTENSION                                    11/07/2005    CORONARY ARTERY DISEASE                         07/09/2000     CONGESTIVE HEART FAILURE                        12/11/2005   COPD                                            11/07/2005    GERD                                            11/07/2005    DIVERTICULOSIS, COLON, WITH HEMORRHAGE          03/17/2001    BACK PAIN, LUMBAR, WITH RADICULOPATHY           03/07/2006     ABNORMAL ELECTROCARDIOGRAM                      06/16/2009    OBSTRUCTIVE SLEEP APNEA                         01/10/2010    Past Surgical History:   uvuloplasty  TOTAL ABDOMINAL HYSTERECTOMY W/ BILATERAL SALP*              ABDOMINAL HYSTERECTOMY                                       CHOLECYSTECTOMY                                              reports that she quit smoking about 3 years ago. She has never used smokeless tobacco. She reports that she does not drink alcohol or use illicit drugs.  family history includes Alcohol abuse in her brother;  Breast cancer in her sister; Cancer in her sister; Colon cancer in an unspecified family member; Emphysema in her sister; Prostate cancer in her brother; and Stroke in her mother.    Current medicines and allergies were reviewed in [facility]    Physical Exam: BP 132/74  Pulse 88  Ht 5\' 9"  (1.753 m)  Wt 237 lb 3.2 oz (107.593 kg)  BMI 35.03 kg/m2 Constitutional: generally well-appearing Psychiatric: alert and oriented x3 Abdomen: soft, nontender, nondistended, no obvious ascites, no peritoneal signs, normal bowel sounds     Assessment and plan: 65 y.o. female with chronic constipation Her symptoms have improved but she still feels incomplete evacuation. She will add fiber supplements to her regimen and will call to report on her symptoms and 5 or 6 weeks. I have not yet received information from her urologist about the question of a bladder fistula from many years ago. She was told she was going to require surgery.

## 2010-06-28 NOTE — Patient Instructions (Signed)
Please start taking citrucel (orange flavored) powder fiber supplement.  This may cause some bloating at first but that usually goes away. Begin with a small spoonful and work your way up to a large, heaping spoonful daily over a week. Stay on the amitiza pills, the dulcolax and the miralax. Call Dr. Christella Hartigan office in 5-6 to report on your symptoms. We will contact [facility] again about your "bladder fistula questions" from your urology appt several years ago. A copy of this information will be made available to Dr. Yetta Barre.

## 2010-08-23 ENCOUNTER — Encounter: Payer: Self-pay | Admitting: Internal Medicine

## 2010-08-23 ENCOUNTER — Ambulatory Visit (INDEPENDENT_AMBULATORY_CARE_PROVIDER_SITE_OTHER)
Admission: RE | Admit: 2010-08-23 | Discharge: 2010-08-23 | Disposition: A | Discharge status: Home / Self Care | Payer: Medicare Other | Source: Ambulatory Visit | Attending: Internal Medicine | Admitting: Internal Medicine

## 2010-08-23 ENCOUNTER — Ambulatory Visit (INDEPENDENT_AMBULATORY_CARE_PROVIDER_SITE_OTHER): Payer: Medicare Other | Admitting: Internal Medicine

## 2010-08-23 ENCOUNTER — Other Ambulatory Visit (INDEPENDENT_AMBULATORY_CARE_PROVIDER_SITE_OTHER): Payer: Medicare Other

## 2010-08-23 ENCOUNTER — Other Ambulatory Visit: Payer: Self-pay | Admitting: Internal Medicine

## 2010-08-23 ENCOUNTER — Other Ambulatory Visit (HOSPITAL_COMMUNITY)
Admission: RE | Admit: 2010-08-23 | Discharge: 2010-08-23 | Disposition: A | Discharge status: Home / Self Care | Payer: Medicare Other | Source: Ambulatory Visit | Attending: Internal Medicine | Admitting: Internal Medicine

## 2010-08-23 VITALS — BP 124/80 | HR 79 | Temp 98.0°F | Resp 16 | Wt 234.0 lb

## 2010-08-23 DIAGNOSIS — I10 Essential (primary) hypertension: Secondary | ICD-10-CM

## 2010-08-23 DIAGNOSIS — R0789 Other chest pain: Secondary | ICD-10-CM

## 2010-08-23 DIAGNOSIS — R5383 Other fatigue: Secondary | ICD-10-CM

## 2010-08-23 DIAGNOSIS — E785 Hyperlipidemia, unspecified: Secondary | ICD-10-CM

## 2010-08-23 DIAGNOSIS — R5381 Other malaise: Secondary | ICD-10-CM

## 2010-08-23 DIAGNOSIS — I509 Heart failure, unspecified: Secondary | ICD-10-CM

## 2010-08-23 DIAGNOSIS — J069 Acute upper respiratory infection, unspecified: Secondary | ICD-10-CM | POA: Insufficient documentation

## 2010-08-23 DIAGNOSIS — R0602 Shortness of breath: Secondary | ICD-10-CM

## 2010-08-23 DIAGNOSIS — Z124 Encounter for screening for malignant neoplasm of cervix: Secondary | ICD-10-CM | POA: Insufficient documentation

## 2010-08-23 DIAGNOSIS — R05 Cough: Secondary | ICD-10-CM

## 2010-08-23 DIAGNOSIS — E119 Type 2 diabetes mellitus without complications: Secondary | ICD-10-CM

## 2010-08-23 DIAGNOSIS — N898 Other specified noninflammatory disorders of vagina: Secondary | ICD-10-CM | POA: Insufficient documentation

## 2010-08-23 LAB — CBC WITH DIFFERENTIAL/PLATELET
Basophils Absolute: 0 K/uL (ref 0.0–0.1)
Basophils Relative: 0.3 % (ref 0.0–3.0)
Eosinophils Absolute: 0.2 K/uL (ref 0.0–0.7)
Eosinophils Relative: 2.8 % (ref 0.0–5.0)
HCT: 37.8 % (ref 36.0–46.0)
Hemoglobin: 12.6 g/dL (ref 12.0–15.0)
Lymphocytes Relative: 32.1 % (ref 12.0–46.0)
Lymphs Abs: 2.3 K/uL (ref 0.7–4.0)
MCHC: 33.2 g/dL (ref 30.0–36.0)
MCV: 87.9 fl (ref 78.0–100.0)
Monocytes Absolute: 0.6 K/uL (ref 0.1–1.0)
Monocytes Relative: 8.1 % (ref 3.0–12.0)
Neutro Abs: 4.1 K/uL (ref 1.4–7.7)
Neutrophils Relative %: 56.7 % (ref 43.0–77.0)
Platelets: 326 K/uL (ref 150.0–400.0)
RBC: 4.3 Mil/uL (ref 3.87–5.11)
RDW: 15.6 % — ABNORMAL HIGH (ref 11.5–14.6)
WBC: 7.2 K/uL (ref 4.5–10.5)

## 2010-08-23 LAB — URINALYSIS, ROUTINE W REFLEX MICROSCOPIC
Bilirubin Urine: NEGATIVE
Hgb urine dipstick: NEGATIVE
Ketones, ur: NEGATIVE
Nitrite: NEGATIVE
Specific Gravity, Urine: 1.01
Total Protein, Urine: 30
Urine Glucose: NEGATIVE
Urobilinogen, UA: 0.2
pH: 6.5 (ref 5.0–8.0)

## 2010-08-23 LAB — WET PREP, GENITAL
Bacteria: NONE SEEN
Trich, Wet Prep: NONE SEEN

## 2010-08-23 LAB — COMPREHENSIVE METABOLIC PANEL
ALT: 16 U/L (ref 0–35)
AST: 21 U/L (ref 0–37)
Albumin: 3.7 g/dL (ref 3.5–5.2)
CO2: 31 mEq/L (ref 19–32)
Calcium: 9.3 mg/dL (ref 8.4–10.5)
Chloride: 101 mEq/L (ref 96–112)
Creatinine, Ser: 1 mg/dL (ref 0.4–1.2)
GFR: 68.82 mL/min (ref >60.00)
Potassium: 4.2 mEq/L (ref 3.5–5.1)
Total Protein: 7.4 g/dL (ref 6.0–8.3)

## 2010-08-23 LAB — HEMOGLOBIN A1C: Hgb A1c MFr Bld: 9.3 % — ABNORMAL HIGH (ref 4.6–6.5)

## 2010-08-23 LAB — LIPID PANEL
HDL: 44.4 mg/dL (ref >39.00)
LDL Cholesterol: 78 mg/dL (ref 0–99)
Total CHOL/HDL Ratio: 3

## 2010-08-23 LAB — BRAIN NATRIURETIC PEPTIDE: Pro B Natriuretic peptide (BNP): 104 pg/mL — ABNORMAL HIGH (ref 0.0–100.0)

## 2010-08-23 MED ORDER — AZITHROMYCIN 500 MG PO TABS: 500.0000 mg | ORAL_TABLET | Freq: Every day | ORAL | Status: AC

## 2010-08-23 NOTE — Assessment & Plan Note (Signed)
Her BP is well controlled, if her cough persists then I will ask her to stop the ACEI, today I will check her lytes and renal function

## 2010-08-23 NOTE — Assessment & Plan Note (Signed)
Exam is normal, I will check PAP and wet prep

## 2010-08-23 NOTE — Progress Notes (Signed)
Addended byRosalio Macadamia, [employer] B on: 08/23/2010 10:22 AM   Modules accepted: Orders

## 2010-08-23 NOTE — Assessment & Plan Note (Signed)
Will check her A1C today and will continue the current meds

## 2010-08-23 NOTE — Progress Notes (Signed)
Subjective:    Patient ID: [name], female    DOB: May 22, 1945, 65 y.o.   MRN: [id number]  Cough This is a recurrent problem. The current episode started 1 to 4 weeks ago. The problem has been unchanged. The problem occurs every few hours. The cough is non-productive. Associated symptoms include a sore throat, shortness of breath and sweats. Pertinent negatives include no chest pain, chills, ear congestion, ear pain, eye redness, fever, headaches, heartburn, hemoptysis, myalgias, nasal congestion, postnasal drip, rash, rhinorrhea, weight loss or wheezing. The symptoms are aggravated by nothing. She has tried nothing for the symptoms. Her past medical history is significant for COPD.  Hypertension This is a chronic problem. The current episode started more than 1 year ago. The problem is unchanged. The problem is controlled. Associated symptoms include malaise/fatigue, shortness of breath and sweats. Pertinent negatives include no anxiety, blurred vision, chest pain, headaches, neck pain, orthopnea, palpitations, peripheral edema or PND. Past treatments include beta blockers, calcium channel blockers, diuretics and ACE inhibitors. The current treatment provides significant improvement. Compliance problems include exercise and diet.       Review of Systems  Constitutional: Positive for malaise/fatigue and fatigue. Negative for fever, chills, weight loss, diaphoresis, activity change, appetite change and unexpected weight change.  HENT: Positive for congestion and sore throat. Negative for hearing loss, ear pain, nosebleeds, facial swelling, rhinorrhea, sneezing, mouth sores, trouble swallowing, neck pain, neck stiffness, voice change, postnasal drip, sinus pressure, tinnitus and ear discharge.   Eyes: Negative for blurred vision, photophobia, pain, discharge, redness, itching and visual disturbance.  Respiratory: Positive for cough and shortness of breath. Negative for apnea, hemoptysis,  choking, chest tightness, wheezing and stridor.   Cardiovascular: Negative for chest pain, palpitations, orthopnea, leg swelling and PND.  Gastrointestinal: Positive for constipation. Negative for heartburn, nausea, vomiting, abdominal pain, diarrhea and blood in stool.  Genitourinary: Positive for vaginal discharge. Negative for dysuria, urgency, frequency, hematuria, flank pain, decreased urine volume, vaginal bleeding, enuresis, difficulty urinating, genital sores, vaginal pain, menstrual problem, pelvic pain and dyspareunia.  Musculoskeletal: Negative for myalgias, back pain, joint swelling, arthralgias and gait problem.  Skin: Negative for color change, pallor, rash and wound.  Neurological: Positive for dizziness. Negative for tremors, seizures, syncope, facial asymmetry, speech difficulty, weakness, light-headedness, numbness and headaches.  Hematological: Negative for adenopathy. Does not bruise/bleed easily.  Psychiatric/Behavioral: Negative for suicidal ideas, hallucinations, behavioral problems, confusion, sleep disturbance, self-injury, dysphoric mood, decreased concentration and agitation. The patient is not nervous/anxious and is not hyperactive.        Objective:   Physical Exam  Vitals reviewed. Constitutional: She is oriented to person, place, and time. She appears well-developed and well-nourished. No distress.  HENT:  Head: Atraumatic. No trismus in the jaw.  Right Ear: Hearing, tympanic membrane, external ear and ear canal normal.  Left Ear: Hearing, tympanic membrane, external ear and ear canal normal.  Nose: No mucosal edema, rhinorrhea, nose lacerations, sinus tenderness, nasal deformity, septal deviation or nasal septal hematoma. No epistaxis.  No foreign bodies. Right sinus exhibits no maxillary sinus tenderness and no frontal sinus tenderness. Left sinus exhibits no maxillary sinus tenderness and no frontal sinus tenderness.  Mouth/Throat: Oropharynx is clear and moist  and mucous membranes are normal. Mucous membranes are not pale, not dry and not cyanotic. No oral lesions. No uvula swelling. No oropharyngeal exudate, posterior oropharyngeal edema, posterior oropharyngeal erythema or tonsillar abscesses.  Eyes: Conjunctivae and EOM are normal. Pupils are equal, round, and reactive to light.  Right eye exhibits no discharge. Left eye exhibits no discharge. No scleral icterus.  Neck: Normal range of motion. Neck supple. No JVD present. No tracheal deviation present. No thyromegaly present.  Cardiovascular: Normal rate, regular rhythm, normal heart sounds and intact distal pulses.  Exam reveals no gallop and no friction rub.   No murmur heard. Pulmonary/Chest: Effort normal and breath sounds normal. No stridor. No respiratory distress. She has no decreased breath sounds. She has no wheezes. She has no rhonchi. She has no rales. Chest wall is not dull to percussion. She exhibits no mass, no tenderness, no bony tenderness, no laceration, no crepitus, no edema, no deformity, no swelling and no retraction. Right breast exhibits no inverted nipple, no mass, no nipple discharge, no skin change and no tenderness. Left breast exhibits no inverted nipple, no mass, no nipple discharge, no skin change and no tenderness. Breasts are symmetrical.  Abdominal: Soft. Bowel sounds are normal. She exhibits no distension and no mass. There is no tenderness. There is no rebound and no guarding. Hernia confirmed negative in the right inguinal area and confirmed negative in the left inguinal area.  Genitourinary: Rectum normal and vagina normal. Rectal exam shows no internal hemorrhoid, no fissure, no mass, no tenderness and anal tone normal. Guaiac negative stool. No breast swelling, tenderness, discharge or bleeding. Pelvic exam was performed with patient supine. No labial fusion. There is no rash, tenderness, lesion or injury on the right labia. There is no rash, tenderness, lesion or injury on  the left labia. Cervix exhibits no motion tenderness, no discharge and no friability. Right adnexum displays no mass, no tenderness and no fullness. Left adnexum displays no mass, no tenderness and no fullness. No erythema, tenderness or bleeding around the vagina. No foreign body around the vagina. No signs of injury around the vagina. No vaginal discharge found.  Musculoskeletal: Normal range of motion. She exhibits no edema and no tenderness.  Lymphadenopathy:    She has no cervical adenopathy.       Right: No inguinal adenopathy present.       Left: No inguinal adenopathy present.  Neurological: She is alert and oriented to person, place, and time. She has normal reflexes. She displays normal reflexes. No cranial nerve deficit. She exhibits normal muscle tone. Coordination normal.  Skin: Skin is warm and dry. No rash noted. She is not diaphoretic. No erythema. No pallor.  Psychiatric: She has a normal mood and affect. Her behavior is normal. Judgment and thought content normal.      Lab Results  Component Value Date   WBC 6.2 10/20/2009   HGB 12.0 10/20/2009   HCT 0.37 10/20/2009   PLT 302 10/20/2009   CHOL 139 01/07/2010   TRIG 113 09/24/2006   HDL 39 01/07/2010   ALT 17 09/24/2006   AST 14 09/24/2006   NA 140 05/15/2008   K 4.3 05/15/2008   CL 103 05/15/2008   CREATININE 1.0 05/15/2008   BUN 7 05/15/2008   CO2 31 05/15/2008   INR 1.0 ratio 05/15/2008   HGBA1C 9.1 01/07/2010   MICROALBUR 2.45* 03/15/2007      Assessment & Plan:

## 2010-08-23 NOTE — Assessment & Plan Note (Signed)
Labs ordered to look for secondary causes

## 2010-08-23 NOTE — Assessment & Plan Note (Signed)
I will check her CXR today 

## 2010-08-23 NOTE — Patient Instructions (Signed)
Hypertension (High Blood Pressure) As your heart beats, it forces blood through your arteries. This force is your blood pressure. If the pressure is too high, it is called hypertension (HTN) or high blood pressure. HTN is dangerous because you may have it and not know it. High blood pressure may mean that your heart has to work harder to pump blood. Your arteries may be narrow or stiff. The extra work puts you at risk for heart disease, stroke, and other problems.  Blood pressure consists of two numbers, a higher number over a lower, 110/72, for example. It is stated as "110 over 72." The ideal is below 120 for the top number (systolic) and under 80 for the bottom (diastolic). Write down your blood pressure today. You should pay close attention to your blood pressure if you have certain conditions such as:  Heart failure.  Prior heart attack.   Diabetes   Chronic kidney disease.   Prior stroke.   Multiple risk factors for heart disease.   To see if you have HTN, your blood pressure should be measured while you are seated with your arm held at the level of the heart. It should be measured at least twice. A one-time elevated blood pressure reading (especially in the Emergency Department) does not mean that you need treatment. There may be conditions in which the blood pressure is different between your right and left arms. It is important to see your caregiver soon for a recheck. Most people have essential hypertension which means that there is not a specific cause. This type of high blood pressure may be lowered by changing lifestyle factors such as:  Stress.  Smoking.   Lack of exercise.   Excessive weight.  Drug/tobacco/alcohol use.   Eating less salt.   Most people do not have symptoms from high blood pressure until it has caused damage to the body. Effective treatment can often prevent, delay or reduce that damage. TREATMENT Treatment for high blood pressure, when a cause has been  identified, is directed at the cause. There are a large number of medications to treat HTN. These fall into several categories, and your caregiver will help you select the medicines that are best for you. Medications may have side effects. You should review side effects with your caregiver. If your blood pressure stays high after you have made lifestyle changes or started on medicines,   Your medication(s) may need to be changed.   Other problems may need to be addressed.   Be certain you understand your prescriptions, and know how and when to take your medicine.   Be sure to follow up with your caregiver within the time frame advised (usually within two weeks) to have your blood pressure rechecked and to review your medications.   If you are taking more than one medicine to lower your blood pressure, make sure you know how and at what times they should be taken. Taking two medicines at the same time can result in blood pressure that is too low.  SEEK IMMEDIATE MEDICAL CARE IF YOU DEVELOP:  A severe headache, blurred or changing vision, or confusion.   Unusual weakness or numbness, or a faint feeling.   Severe chest or abdominal pain, vomiting, or breathing problems.  MAKE SURE YOU:   Understand these instructions.   Will watch your condition.   Will get help right away if you are not doing well or get worse.  Document Released: 12/19/2004 Document Re-Released: 06/08/2009 [employer] Patient Information 2011 [employer].Upper Respiratory Infection (URI), Adult An upper respiratory infection (URI) is also known as the common cold. It is often caused by a virus. Colds are easily spread (contagious). You can pass it to others by touch or by drinking out of the same glass. You may have:  A runny nose.  Sneezing.   Coughing.   A stuffy nose (nasal congestion).  A sinus infection.   A sore throat.  A scratchy voice (hoarseness).   You can also have:  Tiredness  (fatigue).  Muscle aches.   A headache.  A mild fever.   Usually, you get well in a week or two. Your doctor will know if you have a URI by talking to you and examining you. HOME CARE  Inhale heated mist or steam (vaporizer or shower).   Sip chicken soup.   Get plenty of rest.   Use lozenges for throat comfort.   Rinse your mouth (gargle) with warm water or salt water (1/4 teaspoon salt in 8 ounces of water).   Only take medicine as told by your doctor.   Drink enough water and fluids to keep your pee (urine) clear or pale yellow.   Rest as needed.   Return to work when your temperature has returned to normal or as told by your doctor. Use a face mask and wash your hands to stop your cold from spreading.  GET HELP IF:  You have a temperature by mouth above 100.5.   After the first few days, you feel you are getting worse, not better.   You have questions about your medicine.  GET HELP RIGHT AWAY IF: You have a temperature by mouth above 100.5Diabetes, Type 2 Diabetes is a lasting (chronic) disease. In type 2 diabetes, the pancreas does not make enough insulin (a hormone), and the body does not respond normally to the insulin that is made. This type of diabetes was also previously called adult onset diabetes. About 90% of all those who have diabetes have type 2. It usually occurs after the age of 36 but can occur at any age. CAUSES Unlike type 1 diabetes, which happens because insulin is no longer being made, type 2 diabetes happens because the body is making less insulin and has trouble using the insulin properly. SYMPTOMS Drinking more than usual.  Urinating more than usual.  Blurred vision.  Dry, itchy skin.  Frequent infection like yeast infections in women.  More tired than usual (fatigue).  TREATMENT Healthy eating.  Exercise.  Medication, if needed.  Monitoring blood glucose (sugar).  Seeing your caregiver regularly.  HOME CARE INSTRUCTIONS Check your blood  glucose (sugar) at least once daily. More frequent monitoring may be necessary, depending on your medications and on how well your diabetes is controlled. Your caregiver will advise you.  Take your medicine as directed by your caregiver.  Do not smoke.  Make wise food choices. Ask your caregiver for information. Weight loss can improve your diabetes.  Learn about low blood glucose (hypoglycemia) and how to treat it.  Get your eyes checked regularly.  Have a yearly physical exam. Have your blood pressure checked. Get your blood and urine tested.  Wear a pendant or bracelet saying that you have diabetes.  Check your feet every night for sores. Let your caregiver know if you have sores that are not healing.  SEEK MEDICAL CARE IF: You are having problems keeping your blood glucose at target range.  You feel you might be having problems with your medicines.  You have symptoms of an illness that is not improving after 24 hours.  You have a sore or wound that is not healing.  You notice a change in vision or a new problem with your vision.  You develop a fever of more than 100.5.  Document Released: 12/19/2004 Document Re-Released: 01/10/2009  [facility] Patient Information 2011 [place], [place]., not controlled by medicine.   You have a bad or lasting headache, ear pain, sinus pain, or chest pain.   You have trouble breathing or get short of breath.   You have a lasting cough, cough up blood, or have a change in your usual mucus.   You have sore muscles, a stiff neck, or a very bad headache, not controlled with medicine.  MAKE SURE YOU:  Understand these instructions.   Will watch your condition.   Will get help right away if you are not doing well or get worse.  Document Released: 06/07/2007 Document Re-Released: 03/15/2009 [facility] Patient Information 2011 [place], [place].

## 2010-08-23 NOTE — Assessment & Plan Note (Signed)
CXR today to look for pulmonary edema and BNP as well

## 2010-08-23 NOTE — Assessment & Plan Note (Signed)
Start zpak.

## 2010-08-24 ENCOUNTER — Encounter: Payer: Self-pay | Admitting: Internal Medicine

## 2010-08-24 ENCOUNTER — Other Ambulatory Visit: Payer: Self-pay | Admitting: Internal Medicine

## 2010-08-24 ENCOUNTER — Telehealth: Payer: Self-pay | Admitting: *Deleted

## 2010-08-24 DIAGNOSIS — E119 Type 2 diabetes mellitus without complications: Secondary | ICD-10-CM

## 2010-08-24 MED ORDER — COLESEVELAM HCL 3.75 G PO PACK: 1.0000 | PACK | Freq: Every day | ORAL | Status: DC

## 2010-08-24 MED ORDER — FLUCONAZOLE 150 MG PO TABS: 150.0000 mg | ORAL_TABLET | Freq: Once | ORAL | Status: AC

## 2010-08-24 NOTE — Telephone Encounter (Signed)
Patient requesting results of labs. I see letter mailed today, need to call pt to inform.

## 2010-08-24 NOTE — Telephone Encounter (Signed)
Patient notified

## 2010-08-24 NOTE — Progress Notes (Signed)
Addended by: [name] on: 08/24/2010 07:38 AM   Modules accepted: Orders

## 2010-09-12 ENCOUNTER — Encounter: Payer: Self-pay | Admitting: Internal Medicine

## 2010-09-12 ENCOUNTER — Ambulatory Visit (INDEPENDENT_AMBULATORY_CARE_PROVIDER_SITE_OTHER): Payer: Medicare Other | Admitting: Internal Medicine

## 2010-09-12 ENCOUNTER — Telehealth: Payer: Self-pay | Admitting: *Deleted

## 2010-09-12 ENCOUNTER — Ambulatory Visit (INDEPENDENT_AMBULATORY_CARE_PROVIDER_SITE_OTHER)
Admission: RE | Admit: 2010-09-12 | Discharge: 2010-09-12 | Disposition: A | Discharge status: Home / Self Care | Payer: Medicare Other | Source: Ambulatory Visit | Attending: Internal Medicine | Admitting: Internal Medicine

## 2010-09-12 VITALS — BP 118/72 | HR 86 | Temp 98.7°F | Resp 16 | Wt 225.0 lb

## 2010-09-12 DIAGNOSIS — M545 Low back pain: Secondary | ICD-10-CM

## 2010-09-12 DIAGNOSIS — IMO0002 Reserved for concepts with insufficient information to code with codable children: Secondary | ICD-10-CM

## 2010-09-12 DIAGNOSIS — M5416 Radiculopathy, lumbar region: Secondary | ICD-10-CM | POA: Insufficient documentation

## 2010-09-12 MED ORDER — OXYCODONE-ACETAMINOPHEN 7.5-325 MG PO TABS: 1.0000 | ORAL_TABLET | ORAL | Status: DC | PRN

## 2010-09-12 NOTE — Assessment & Plan Note (Signed)
I will check a plain film today to look for boney lesion, mass, occult fracture

## 2010-09-12 NOTE — Telephone Encounter (Signed)
Pt c/o back pain and wants earlier apt. Scheduled for OV today for eval.

## 2010-09-12 NOTE — Assessment & Plan Note (Signed)
I gave her an injection of depo-medrol IM to reduce the pain and inflammation form what appears to be a disc herniation, also will give her an injection of toradol but I don't think she should take nsaids long term due to her hx of CHF, she was given an Rx of percocet for pain

## 2010-09-12 NOTE — Assessment & Plan Note (Signed)
She needs to have an MRI done to check for nerve impingement, spinal stenosis, etc

## 2010-09-12 NOTE — Patient Instructions (Signed)
Back Pain (Lumbosacral Strain) Back pain is one of the most common causes of pain. There are many causes of back pain. Most are not serious conditions.  CAUSES Your backbone (spinal column) is made up of 24 main vertebral bodies, the sacrum, and the coccyx. These are held together by muscles and tough, fibrous tissue (ligaments). Nerve roots pass through the openings between the vertebrae. A sudden move or injury to the back may cause injury to, or pressure on, these nerves. This may result in localized back pain or pain movement (radiation) into the buttocks, down the leg, and into the foot. Sharp, shooting pain from the buttock down the back of the leg (sciatica) is frequently associated with a ruptured (herniated) disc. Pain may be caused by muscle spasm alone. Your caregiver can often find the cause of your pain by the details of your symptoms and an exam. In some cases, you may need tests (such as X-rays). Your caregiver will work with you to decide if any tests are needed based on your specific exam. HOME CARE INSTRUCTIONS  Avoid an underactive lifestyle. Active exercise, as directed by your caregiver, is your greatest weapon against back pain.   Avoid hard physical activities (tennis, racquetball, water-skiing) if you are not in proper physical condition for it. This may aggravate and/or create problems.   If you have a back problem, avoid sports requiring sudden body movements. Swimming and walking are generally safer activities.   Maintain good posture.   Avoid becoming overweight (obese).   Use bed rest for only the most extreme, sudden (acute) episode. Your caregiver will help you determine how much bed rest is necessary.   For acute conditions, you may put ice on the injured area.   Put ice in a plastic bag.   Place a towel between your skin and the bag.   Leave the ice on for 20 minutes at a time, every 2 hours, or as needed.   After you are improved and more active, it may  help to apply heat for 30 minutes before activities.  See your caregiver if you are having pain that lasts longer than expected. Your caregiver can advise appropriate exercises and/or therapy if needed. With conditioning, most back problems can be avoided. SEEK IMMEDIATE MEDICAL CARE IF:  You have numbness, tingling, weakness, or problems with the use of your arms or legs.   You experience severe back pain not relieved with medicines.   There is a change in bowel or bladder control.   You have increasing pain in any area of the body, including your belly (abdomen).   You notice shortness of breath, dizziness, or feel faint.   You feel sick to your stomach (nauseous), are throwing up (vomiting), or become sweaty.   You notice discoloration of your toes or legs, or your feet get very cold.   Your back pain is getting worse.   You have an oral temperature above 100.5, not controlled by medicine.  MAKE SURE YOU:   Understand these instructions.   Will watch your condition.   Will get help right away if you are not doing well or get worse.  Document Released: 09/28/2004 Document Re-Released: 03/15/2009 [employer] Patient Information 2011 [employer]. 

## 2010-09-12 NOTE — Progress Notes (Signed)
Subjective:    Patient ID: [name], female    DOB: Oct 24, 1945, 65 y.o.   MRN: [id number]  Back Pain This is a new problem. The current episode started in the past 7 days. The problem has been gradually worsening since onset. The pain is present in the lumbar spine. The quality of the pain is described as shooting and stabbing. The pain radiates to the right thigh. The pain is at a severity of 8/10. The pain is severe. The pain is the same all the time. The symptoms are aggravated by bending. Stiffness is present all day. Pertinent negatives include no abdominal pain, bladder incontinence, bowel incontinence, chest pain, dysuria, fever, headaches, leg pain, numbness, paresis, paresthesias, pelvic pain, perianal numbness, tingling, weakness or weight loss. She has tried analgesics (tylenol) for the symptoms. The treatment provided no relief.      Review of Systems  Constitutional: Negative for fever, chills, weight loss, diaphoresis, activity change, appetite change, fatigue and unexpected weight change.  HENT: Negative.   Eyes: Negative.   Respiratory: Negative.   Cardiovascular: Negative for chest pain, palpitations and leg swelling.  Gastrointestinal: Negative for nausea, vomiting, abdominal pain, diarrhea, constipation, blood in stool, abdominal distention, anal bleeding, rectal pain and bowel incontinence.  Genitourinary: Negative for bladder incontinence, dysuria, urgency, frequency, hematuria, flank pain, enuresis, difficulty urinating, pelvic pain and dyspareunia.  Musculoskeletal: Positive for back pain. Negative for myalgias, joint swelling, arthralgias and gait problem.  Skin: Negative for color change, pallor, rash and wound.  Neurological: Negative for tingling, tremors, seizures, syncope, facial asymmetry, speech difficulty, weakness, light-headedness, numbness, headaches and paresthesias.  Hematological: Negative for adenopathy. Does not bruise/bleed easily.    Psychiatric/Behavioral: Negative.        Objective:   Physical Exam  Vitals reviewed. Constitutional: She is oriented to person, place, and time. She appears well-developed and well-nourished. No distress.  HENT:  Mouth/Throat: Oropharynx is clear and moist. No oropharyngeal exudate.  Eyes: Conjunctivae are normal. Right eye exhibits no discharge. Left eye exhibits no discharge. No scleral icterus.  Neck: Normal range of motion. Neck supple. No JVD present. No tracheal deviation present. No thyromegaly present.  Cardiovascular: Normal rate, regular rhythm, normal heart sounds and intact distal pulses.  Exam reveals no gallop and no friction rub.   No murmur heard. Pulmonary/Chest: Effort normal and breath sounds normal. No stridor. No respiratory distress. She has no wheezes. She has no rales. She exhibits no tenderness.  Abdominal: Soft. Bowel sounds are normal. She exhibits no distension and no mass. There is no tenderness. There is no rebound and no guarding.  Musculoskeletal: Normal range of motion. She exhibits no edema and no tenderness.       Lumbar back: She exhibits bony tenderness. She exhibits normal range of motion, no tenderness, no swelling, no edema, no deformity, no laceration, no pain, no spasm and normal pulse.  Lymphadenopathy:    She has no cervical adenopathy.  Neurological: She is alert and oriented to person, place, and time. She has normal strength. She is not disoriented. She displays no atrophy, no tremor and normal reflexes. No cranial nerve deficit or sensory deficit. She exhibits normal muscle tone. She displays a negative Romberg sign. She displays no seizure activity. Coordination and gait normal. She displays no Babinski's sign on the right side. She displays no Babinski's sign on the left side.  Reflex Scores:      Tricep reflexes are 0 on the right side and 0 on the left side.  Bicep reflexes are 0 on the right side and 0 on the left side.       Brachioradialis reflexes are 0 on the right side and 0 on the left side.      Patellar reflexes are 0 on the right side and 0 on the left side.      Achilles reflexes are 0 on the right side and 0 on the left side.      + SLR in the right lower extremity and - SLR in the left lower extremity  Skin: Skin is warm and dry. No rash noted. She is not diaphoretic. No erythema. No pallor.  Psychiatric: She has a normal mood and affect. Her behavior is normal. Judgment and thought content normal.          Assessment & Plan:

## 2010-09-15 ENCOUNTER — Telehealth: Payer: Self-pay

## 2010-09-15 NOTE — Telephone Encounter (Signed)
Patient notified

## 2010-09-15 NOTE — Telephone Encounter (Signed)
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

## 2010-09-15 NOTE — Telephone Encounter (Signed)
Patient called requesting xray results. Thanks

## 2010-09-17 ENCOUNTER — Inpatient Hospital Stay (HOSPITAL_COMMUNITY)
Admission: EM | Admit: 2010-09-17 | Discharge: 2010-09-20 | DRG: 193 | Disposition: A | Discharge status: Home / Self Care | Payer: Medicare Other | Attending: Internal Medicine | Admitting: Internal Medicine

## 2010-09-17 ENCOUNTER — Inpatient Hospital Stay (HOSPITAL_COMMUNITY): Payer: Medicare Other

## 2010-09-17 ENCOUNTER — Emergency Department (HOSPITAL_COMMUNITY): Payer: Medicare Other

## 2010-09-17 ENCOUNTER — Encounter (HOSPITAL_COMMUNITY): Payer: Self-pay | Admitting: Radiology

## 2010-09-17 DIAGNOSIS — K219 Gastro-esophageal reflux disease without esophagitis: Secondary | ICD-10-CM | POA: Diagnosis present

## 2010-09-17 DIAGNOSIS — K5900 Constipation, unspecified: Secondary | ICD-10-CM | POA: Diagnosis present

## 2010-09-17 DIAGNOSIS — J189 Pneumonia, unspecified organism: Principal | ICD-10-CM | POA: Diagnosis present

## 2010-09-17 DIAGNOSIS — I10 Essential (primary) hypertension: Secondary | ICD-10-CM | POA: Diagnosis present

## 2010-09-17 DIAGNOSIS — M1990 Unspecified osteoarthritis, unspecified site: Secondary | ICD-10-CM | POA: Diagnosis present

## 2010-09-17 DIAGNOSIS — E119 Type 2 diabetes mellitus without complications: Secondary | ICD-10-CM | POA: Diagnosis present

## 2010-09-17 DIAGNOSIS — I509 Heart failure, unspecified: Secondary | ICD-10-CM | POA: Diagnosis present

## 2010-09-17 DIAGNOSIS — I5022 Chronic systolic (congestive) heart failure: Secondary | ICD-10-CM | POA: Diagnosis present

## 2010-09-17 DIAGNOSIS — F329 Major depressive disorder, single episode, unspecified: Secondary | ICD-10-CM | POA: Diagnosis present

## 2010-09-17 DIAGNOSIS — F3289 Other specified depressive episodes: Secondary | ICD-10-CM | POA: Diagnosis present

## 2010-09-17 DIAGNOSIS — J9600 Acute respiratory failure, unspecified whether with hypoxia or hypercapnia: Secondary | ICD-10-CM | POA: Diagnosis present

## 2010-09-17 LAB — URINE MICROSCOPIC-ADD ON

## 2010-09-17 LAB — POCT I-STAT TROPONIN I: Troponin i, poc: 0.01 ng/mL (ref 0.00–0.08)

## 2010-09-17 LAB — GLUCOSE, CAPILLARY: Glucose-Capillary: 332 mg/dL — ABNORMAL HIGH (ref 70–99)

## 2010-09-17 LAB — DIFFERENTIAL
Basophils Absolute: 0 10*3/uL (ref 0.0–0.1)
Eosinophils Relative: 2 % (ref 0–5)
Lymphocytes Relative: 40 % (ref 12–46)
Lymphs Abs: 3 10*3/uL (ref 0.7–4.0)
Monocytes Absolute: 0.7 10*3/uL (ref 0.1–1.0)
Neutro Abs: 3.6 10*3/uL (ref 1.7–7.7)

## 2010-09-17 LAB — AMYLASE: Amylase: 16 U/L (ref 0–105)

## 2010-09-17 LAB — POCT I-STAT 3, ART BLOOD GAS (G3+)
Bicarbonate: 31.6 mEq/L — ABNORMAL HIGH (ref 20.0–24.0)
O2 Saturation: 100 %
Patient temperature: 98.6
TCO2: 33 mmol/L (ref 0–100)

## 2010-09-17 LAB — CARDIAC PANEL(CRET KIN+CKTOT+MB+TROPI)
CK, MB: 6.4 ng/mL (ref 0.3–4.0)
Relative Index: 4.4 — ABNORMAL HIGH (ref 0.0–2.5)
Total CK: 148 U/L (ref 7–177)

## 2010-09-17 LAB — URINALYSIS, ROUTINE W REFLEX MICROSCOPIC
Leukocytes, UA: NEGATIVE
Nitrite: NEGATIVE
Specific Gravity, Urine: 1.009 (ref 1.005–1.030)
Urobilinogen, UA: 0.2 mg/dL (ref 0.0–1.0)
pH: 5 (ref 5.0–8.0)

## 2010-09-17 LAB — CBC
HCT: 34.7 % — ABNORMAL LOW (ref 36.0–46.0)
Hemoglobin: 11.8 g/dL — ABNORMAL LOW (ref 12.0–15.0)
MCV: 84.6 fL (ref 78.0–100.0)
RDW: 14.1 % (ref 11.5–15.5)
WBC: 7.5 10*3/uL (ref 4.0–10.5)

## 2010-09-17 LAB — BASIC METABOLIC PANEL
BUN: 11 mg/dL (ref 6–23)
Chloride: 92 mEq/L — ABNORMAL LOW (ref 96–112)
Creatinine, Ser: 0.88 mg/dL (ref 0.50–1.10)
Glucose, Bld: 132 mg/dL — ABNORMAL HIGH (ref 70–99)
Potassium: 3.5 mEq/L (ref 3.5–5.1)

## 2010-09-17 LAB — MRSA PCR SCREENING: MRSA by PCR: NEGATIVE

## 2010-09-17 LAB — CK TOTAL AND CKMB (NOT AT ARMC)
CK, MB: 6.1 ng/mL (ref 0.3–4.0)
Relative Index: 4.3 — ABNORMAL HIGH (ref 0.0–2.5)

## 2010-09-17 LAB — LIPASE, BLOOD: Lipase: 9 U/L — ABNORMAL LOW (ref 11–59)

## 2010-09-17 MED ORDER — IOHEXOL 300 MG/ML  SOLN
100.0000 mL | Freq: Once | INTRAMUSCULAR | Status: AC | PRN
  Administered 2010-09-17: 100 mL via INTRAVENOUS

## 2010-09-18 LAB — COMPREHENSIVE METABOLIC PANEL
Albumin: 3.2 g/dL — ABNORMAL LOW (ref 3.5–5.2)
BUN: 14 mg/dL (ref 6–23)
Calcium: 9.3 mg/dL (ref 8.4–10.5)
GFR calc Af Amer: >60 mL/min (ref >60)
Glucose, Bld: 243 mg/dL — ABNORMAL HIGH (ref 70–99)
Potassium: 4 mEq/L (ref 3.5–5.1)
Sodium: 133 mEq/L — ABNORMAL LOW (ref 135–145)
Total Protein: 6.8 g/dL (ref 6.0–8.3)

## 2010-09-18 LAB — HEMOGLOBIN A1C
Hgb A1c MFr Bld: 9.1 % — ABNORMAL HIGH (ref <5.7)
Mean Plasma Glucose: 214 mg/dL — ABNORMAL HIGH (ref <117)

## 2010-09-18 LAB — LIPID PANEL
Cholesterol: 152 mg/dL (ref 0–200)
HDL: 60 mg/dL (ref >39)
Triglycerides: 71 mg/dL (ref <150)

## 2010-09-18 LAB — PHOSPHORUS: Phosphorus: 2.5 mg/dL (ref 2.3–4.6)

## 2010-09-18 LAB — GLUCOSE, CAPILLARY: Glucose-Capillary: 362 mg/dL — ABNORMAL HIGH (ref 70–99)

## 2010-09-18 LAB — CBC
MCH: 28.2 pg (ref 26.0–34.0)
MCV: 83.4 fL (ref 78.0–100.0)
Platelets: 335 10*3/uL (ref 150–400)
RBC: 3.79 MIL/uL — ABNORMAL LOW (ref 3.87–5.11)
RDW: 14.3 % (ref 11.5–15.5)
WBC: 12.4 10*3/uL — ABNORMAL HIGH (ref 4.0–10.5)

## 2010-09-18 LAB — MAGNESIUM: Magnesium: 2.1 mg/dL (ref 1.5–2.5)

## 2010-09-18 LAB — PROTIME-INR: Prothrombin Time: 14.5 seconds (ref 11.6–15.2)

## 2010-09-18 LAB — TSH: TSH: 0.433 u[IU]/mL (ref 0.350–4.500)

## 2010-09-18 NOTE — H&P (Signed)
NAMEREGINALD, MANGELS NO.:  [id number]  MEDICAL RECORD NO.:  [id number]  LOCATION:  [facility]                         FACILITY:  [facility]  PHYSICIAN:  Conley Canal, MD      DATE OF BIRTH:  03/29/1945  DATE OF ADMISSION:  09/17/2010 DATE OF DISCHARGE:                             HISTORY & PHYSICAL   PRIMARY CARE PHYSICIAN:  Dr. [name] with [place].  CARDIOLOGIST:  [name], MD, [facility]  CHIEF COMPLAINT:  Cough, shortness of breath for the last few days.  HISTORY OF PRESENT ILLNESS:  Ms. Yono is a pleasant 65 year old female with multiple comorbidities including morbid obesity, hypertension, hyperlipidemia, nonobstructive coronary artery disease, obstructive sleep apnea on CPAP at home, osteoarthritis, diabetes mellitus type 2, depression, chronic systolic congestive heart failure with EF in the 40-50% range, history of GI bleeding in 2004 secondary to diverticular disease, previous history of pancreatitis who comes in with complaints of shortness of breath, pleuritic chest pain, cough productive of clear colored sputum ongoing for the last few days.  The patient states that she has been unwell for the last 2 weeks.  She initially had episodes of vomiting clear colored material associated with low back pain as well as right-sided abdominal pain which was then followed by the cough which has worsened in the last 2 days.  She admits to being unusually sweaty.  She also had some diarrhea.  Her cough was associated with pleuritic chest pain which she says is aching in nature and is central.  In the emergency room, the patient had a chest x-ray which showed mild right basilar opacity which could be secondary to pneumonia and also some mild cardiomegaly.  She was given Avelox and referred to the Hospitalist Service for admission.  At the time of my evaluation, she continues to cough but the cough seems dry at this point.  She denies orthopnea or PND.  She  denies sick contacts.  PAST MEDICAL HISTORY: 1. Diabetes mellitus type 2. 2. Malignant hypertension. 3. Morbid obesity BMI greater than 40. 4. Hyperlipidemia. 5. Nonobstructive coronary artery disease. 6. Cardiomyopathy, EF 40-50%. 7. Depression. 8. Obstructive sleep apnea on CPAP. 9. Diverticular disease with GI bleed in 2004. 10.Status post cholecystectomy. 11.Total abdominal hysterectomy. 12.Status post bilateral salpingo-oophorectomy.  ALLERGIES:  PENICILLIN, CODEINE, ADHESIVE TAPE.  HOME MEDICATIONS:  OxyContin, Robaxin, vitamin E, vitamin D3, aspirin, MiraLax, Altace, Lasix, Coreg, aspart insulin, Lantus, Protonix, Caduet, Effexor.  REVIEW OF SYSTEMS:  Unremarkable except as highlighted in the history of present illness.  FAMILY HISTORY:  Positive for sister with history of breast cancer. Mother died at age 73, she was hypertensive.  SOCIAL HISTORY:  The patient is married.  She quit cigarette smoking years ago.  Denies alcohol or illicit drugs.  PHYSICAL EXAMINATION:  GENERAL:  This is a middle-aged lady who is in some discomfort related to recurrent coughing. VITAL SIGNS:  Blood pressure is in the 150 systolic, heart rate 90s. She is afebrile.  She is oxygenating 90s on 4 liters nasal cannula. Respiratory rate around 20.  HEAD, EARS, NOSE AND THROAT:  Pupils equal and reacting to light.  No jugular venous distention.  No carotid bruits. RESPIRATORY:  Reduced air  entry bilaterally with basilar coarse rhonchi, no wheezing. CARDIOVASCULAR:  First and second heart sounds heard.  No murmurs. Pulse regular. ABDOMEN:  There is abdominal fullness with some tenderness to deep palpation in the right lower quadrant with no rebound or guarding. Bowel sounds are normal.  CNS:  The patient is alert, oriented in person, place and time with no acute focal neurological deficits. EXTREMITIES:  No pedal edema.  Peripheral pulses are equal.  LABORATORY DATA:  Significant for WBC  7.5, hemoglobin 11.8, hematocrit 34.7, platelet count 325, neutrophils 48%.  Sodium 132, potassium 3.5, BUN 11, creatinine 0.88, calcium 9.4.  Pro BNP 368, CPK 143, CK-MB 6.1, relative index 4.3.  Point-of-care troponin-I 0.01.  ABG 7.37/54/177, 100% on BiPAP.  EKG shows sinus rhythm with ectopic atrial rhythm and first degree AV block, some nonspecific T-wave abnormalities, borderline inferior Q-waves.  IMPRESSION:  A 65 year old female with multiple comorbidities including diabetes mellitus type 2 who comes in with cough and shortness of breath preceded by episodes of abdominal pain, vomiting, low back pain.  Her chest x-rays suggest right lower lobe pneumonia.  She does not have bandemia; however, she has some immunocompromise related to the diabetes mellitus type 2.  It is likely that she has community-acquired pneumonia.  However, the concern would be for an abdominal process leading to vomiting and possibly aspirating into the right lower lobe. She has previous history of diverticulosis.  Her low back pain could point towards pyelonephritis since she had urinalysis some time in August, which was positive, I am not sure if this was treated.  PLAN: 1. Right lower lobe pneumonia resulting in acute hypercapnic, hypoxic     respiratory failure requiring some BiPAP support in the emergency     room.  We will admit the patient to the step-down unit for close     respiratory monitoring.  Given the patient's penicillin allergy and     possibility of aspiration pneumonia, We will cover the patient with     vancomycin, Levaquin and clindamycin for better anaerobic coverage.     Meanwhile, we will send sputum culture and sensitivity as well as     urine Legionella antigen and urine culture.  We will also obtain CT     abdomen and pelvis to evaluate any abdominal process precipitating     the respiratory findings.  We will also check amylase and lipase.     We will continue oxygen  supplementation, CPAP at night, short     course of steroids, bronchodilators. 2. Diabetes mellitus type 2.  Check hemoglobin A1c.  Continue Lantus     and aspart sliding scale.  Expect hyperglycemia related to     steroids. 3. History of cardiomyopathy resulting in systolic congestive heart     failure, last EF in the 40-50% range per medical records.  We will     obtain 2-D echocardiogram, cardiac enzymes, meanwhile, continue     aspirin, beta blocker, statin, Lasix.  We will hold ramipril as the     patient is going to get some IV contrast.  She will need close     renal monitoring. 4. Gastroesophageal reflux disease.  Continue PPI. 5. Hypertension uncontrolled.  Continue Lasix, Coreg, Norvasc.  We     will hold ramipril as mentioned above. 6. Depression.  Continue Effexor. 7. DVT prophylaxis, Lovenox. 8. The patient's condition very closely guarded.     Conley Canal, MD     SR/MEDQ  D:  09/17/2010  T:  09/17/2010  Job:  [id number]  cc:   [name], MD [name], MD, [facility]  Electronically Signed by Conley Canal  on 09/18/2010 03:57:24 PM

## 2010-09-19 LAB — GLUCOSE, CAPILLARY: Glucose-Capillary: 78 mg/dL (ref 70–99)

## 2010-09-20 ENCOUNTER — Inpatient Hospital Stay (HOSPITAL_COMMUNITY): Payer: Medicare Other

## 2010-09-20 LAB — BASIC METABOLIC PANEL
CO2: 30 mEq/L (ref 19–32)
Chloride: 99 mEq/L (ref 96–112)
Creatinine, Ser: 0.89 mg/dL (ref 0.50–1.10)
GFR calc Af Amer: >60 mL/min (ref >60)
Potassium: 3.6 mEq/L (ref 3.5–5.1)

## 2010-09-20 LAB — GLUCOSE, CAPILLARY
Glucose-Capillary: 160 mg/dL — ABNORMAL HIGH (ref 70–99)
Glucose-Capillary: 227 mg/dL — ABNORMAL HIGH (ref 70–99)

## 2010-09-20 LAB — CBC
Hemoglobin: 11.6 g/dL — ABNORMAL LOW (ref 12.0–15.0)
MCH: 29.1 pg (ref 26.0–34.0)
MCV: 85.4 fL (ref 78.0–100.0)
RBC: 3.98 MIL/uL (ref 3.87–5.11)
WBC: 7.2 10*3/uL (ref 4.0–10.5)

## 2010-09-23 ENCOUNTER — Ambulatory Visit: Payer: Medicare Other | Admitting: Internal Medicine

## 2010-09-23 LAB — CULTURE, BLOOD (ROUTINE X 2)
Culture  Setup Time: 201209151334
Culture: NO GROWTH

## 2010-09-26 ENCOUNTER — Encounter: Payer: Self-pay | Admitting: Internal Medicine

## 2010-09-26 ENCOUNTER — Ambulatory Visit (INDEPENDENT_AMBULATORY_CARE_PROVIDER_SITE_OTHER)
Admission: RE | Admit: 2010-09-26 | Discharge: 2010-09-26 | Disposition: A | Discharge status: Home / Self Care | Payer: Medicare Other | Source: Ambulatory Visit | Attending: Internal Medicine | Admitting: Internal Medicine

## 2010-09-26 ENCOUNTER — Ambulatory Visit (INDEPENDENT_AMBULATORY_CARE_PROVIDER_SITE_OTHER): Payer: Medicare Other | Admitting: Internal Medicine

## 2010-09-26 ENCOUNTER — Telehealth: Payer: Self-pay | Admitting: Cardiology

## 2010-09-26 VITALS — BP 110/68 | HR 84 | Temp 98.7°F | Resp 16 | Wt 232.0 lb

## 2010-09-26 DIAGNOSIS — I509 Heart failure, unspecified: Secondary | ICD-10-CM

## 2010-09-26 DIAGNOSIS — J189 Pneumonia, unspecified organism: Secondary | ICD-10-CM

## 2010-09-26 DIAGNOSIS — E119 Type 2 diabetes mellitus without complications: Secondary | ICD-10-CM

## 2010-09-26 DIAGNOSIS — I10 Essential (primary) hypertension: Secondary | ICD-10-CM

## 2010-09-26 LAB — MAGNESIUM: Magnesium: 2

## 2010-09-26 LAB — CBC
HCT: 28.3 — ABNORMAL LOW
HCT: 28.6 — ABNORMAL LOW
HCT: 29.2 — ABNORMAL LOW
HCT: 29.7 — ABNORMAL LOW
HCT: 29.9 — ABNORMAL LOW
HCT: 30.9 — ABNORMAL LOW
HCT: 33.8 — ABNORMAL LOW
Hemoglobin: 10.7 — ABNORMAL LOW
Hemoglobin: 11.2 — ABNORMAL LOW
Hemoglobin: 9.6 — ABNORMAL LOW
Hemoglobin: 9.7 — ABNORMAL LOW
Hemoglobin: 9.9 — ABNORMAL LOW
MCHC: 32.8
MCHC: 33
MCV: 88.9
Platelets: 301
Platelets: 306
Platelets: 312
Platelets: 320
Platelets: 340
RBC: 3.36 — ABNORMAL LOW
RBC: 3.66 — ABNORMAL LOW
RBC: 3.83 — ABNORMAL LOW
RDW: 15.4
RDW: 15.6 — ABNORMAL HIGH
RDW: 15.6 — ABNORMAL HIGH
RDW: 15.8 — ABNORMAL HIGH
WBC: 7.3
WBC: 7.8
WBC: 7.8
WBC: 9.1
WBC: 9.7

## 2010-09-26 LAB — URINALYSIS, ROUTINE W REFLEX MICROSCOPIC
Glucose, UA: NEGATIVE
Specific Gravity, Urine: 1.003 — ABNORMAL LOW
pH: 8

## 2010-09-26 LAB — COMPREHENSIVE METABOLIC PANEL
CO2: 29
Calcium: 8.5
Creatinine, Ser: 0.94
GFR calc Af Amer: >60
GFR calc non Af Amer: >60
Glucose, Bld: 195 — ABNORMAL HIGH
Total Protein: 5.9 — ABNORMAL LOW

## 2010-09-26 LAB — DIFFERENTIAL
Basophils Absolute: 0
Eosinophils Relative: 4
Eosinophils Relative: 4
Lymphocytes Relative: 29
Lymphocytes Relative: 31
Lymphs Abs: 2.7
Lymphs Abs: 2.8
Monocytes Absolute: 0.5
Monocytes Relative: 5
Neutro Abs: 5.4
Neutro Abs: 5.9
Neutrophils Relative %: 59

## 2010-09-26 LAB — BASIC METABOLIC PANEL
BUN: 3 — ABNORMAL LOW
BUN: 4 — ABNORMAL LOW
CO2: 26
Calcium: 8.7
Calcium: 8.9
Chloride: 104
Chloride: 107
Creatinine, Ser: 0.66
GFR calc Af Amer: >60
GFR calc non Af Amer: >60
Glucose, Bld: 129 — ABNORMAL HIGH
Glucose, Bld: 158 — ABNORMAL HIGH
Glucose, Bld: 62 — ABNORMAL LOW
Potassium: 3.1 — ABNORMAL LOW
Potassium: 3.3 — ABNORMAL LOW
Potassium: 3.4 — ABNORMAL LOW
Sodium: 141
Sodium: 144

## 2010-09-26 LAB — CROSSMATCH: ABO/RH(D): AB POS

## 2010-09-26 LAB — IRON AND TIBC
Saturation Ratios: 3 — ABNORMAL LOW
TIBC: 339
UIBC: 329

## 2010-09-26 LAB — I-STAT 8, (EC8 V) (CONVERTED LAB)
BUN: 5 — ABNORMAL LOW
Chloride: 105
Glucose, Bld: 85
Hemoglobin: 9.9 — ABNORMAL LOW
Potassium: 3.7
Sodium: 141
TCO2: 28
pH, Ven: 7.419 — ABNORMAL HIGH

## 2010-09-26 LAB — RAPID URINE DRUG SCREEN, HOSP PERFORMED
Amphetamines: NOT DETECTED
Tetrahydrocannabinol: NOT DETECTED

## 2010-09-26 LAB — URINE CULTURE
Colony Count: 5000
Special Requests: NEGATIVE

## 2010-09-26 LAB — RETICULOCYTES
RBC.: 3.32 — ABNORMAL LOW
Retic Count, Absolute: 73
Retic Ct Pct: 2.2

## 2010-09-26 LAB — HEMOGLOBIN AND HEMATOCRIT, BLOOD: Hemoglobin: 11 — ABNORMAL LOW

## 2010-09-26 LAB — VITAMIN B12: Vitamin B-12: 1085 — ABNORMAL HIGH (ref 211–911)

## 2010-09-26 MED ORDER — OLMESARTAN MEDOXOMIL 40 MG PO TABS: 40.0000 mg | ORAL_TABLET | Freq: Every day | ORAL | Status: DC

## 2010-09-26 NOTE — Patient Instructions (Signed)
Pneumonia Pneumonia is an infection of the lungs. It may be caused by a bacteria or virus. Most forms are bacterial. Usually, these infections are caused by breathing infectious particles into the lungs (respiratory tract). SYMPTOMS  The most common problems (symptoms) are:   Cough.  Fever.   Chest pain.  Increased rate of breathing.   Wheezing.  Mucus production.   DIAGNOSIS  Often these infections are diagnosed on exam by your caregiver. Sometimes the diagnosis may require:   Chest X-rays.   Blood analysis.   Cultures. Blood cultures may be done to help find the cause of your pneumonia.  Your caregiver may do tests (blood gasses or pulse oximetry) to see how well your lungs are working. TREATMENT The bacterial pneumonias generally respond well to medicines (antibiotics) that kill germs. Viral infections must run their course. These infections will not respond to antibiotics. A pneumococcal shot (vaccine) is available to prevent a common bacterial pneumonia. This is usually suggested for the elderly and for other groups of higher risk individuals, such as those on chemotherapy or those who have problems with their immune system.  You will have pneumococcal screening or vaccination if you are over 65 years old and are not immunized.   If you are a smoker, it is time to quit. You may receive instructions on how to best stop smoking. Your caregiver can provide medicines and counseling to help you quit.  HOME CARE INSTRUCTIONS  Cough suppressants may be used if you are losing too much rest. However, coughing protects you by clearing your lungs. This is one reason for not using cough suppressants, if able, as they take away this protection.   Your caregiver may have prescribed an antibiotic if she or he feels your cough is caused by a bacterial infection. Take all your medicine until you are finished.   Your caregiver may also prescribe an expectorant to loosen the mucus to be coughed  up.   Only take over-the-counter or prescription medicines for pain, discomfort, or fever as directed by your caregiver.   Smoking is a common cause of bronchitis and can contribute to pneumonia. Stopping this habit is an important self-help step.   If you are a smoker and continue to smoke, your cough may last several weeks after your pneumonia has cleared.   A cold steam vaporizer or humidifier in your room or home may help loosen mucus.   Coughing is often worse at night. Sleeping in a semi-upright position in a recliner or using a couple pillows under your head will help with this.   Get rest as you feel it is needed. Your body will usually let you know when to rest.  SEEK IMMEDIATE MEDICAL CARE IF:  You develop pus-like mucus (sputum) or your illness becomes worse. This is especially true if you are elderly or weakened from any other disease.   You cannot control your cough with suppressants and are losing sleep.   You begin coughing up blood.   You develop pain which is getting worse or is uncontrolled with medicines.   You or your child has an oral temperature above 100.5, not controlled by medicine.   Any of the symptoms which initially brought you in for treatment are getting worse rather than better.   You develop shortness of breath or chest pain.  MAKE SURE YOU:   Understand these instructions.   Will watch your condition.   Will get help right away if you are not doing well or   get worse.  Document Released: 12/19/2004 Document Re-Released: 06/08/2009 [employer] Patient Information 2011 [employer]. 

## 2010-09-26 NOTE — Progress Notes (Signed)
Subjective:    Patient ID: [name], female    DOB: January 05, 1945, 65 y.o.   MRN: [id number]  Cough This is a recurrent problem. The current episode started more than 1 month ago. The problem has been gradually improving. The cough is productive of purulent sputum. Associated symptoms include shortness of breath. Pertinent negatives include no chest pain, chills, ear congestion, ear pain, fever, headaches, heartburn, hemoptysis, myalgias, nasal congestion, postnasal drip, rash, rhinorrhea, sore throat, sweats, weight loss or wheezing. The symptoms are aggravated by other (ramipril). She has tried nothing for the symptoms. Her past medical history is significant for COPD and pneumonia.      Review of Systems  Constitutional: Negative for fever, chills, weight loss, diaphoresis, activity change, appetite change, fatigue and unexpected weight change.  HENT: Negative for ear pain, sore throat, facial swelling, rhinorrhea, trouble swallowing, neck pain, neck stiffness, voice change and postnasal drip.   Eyes: Negative.   Respiratory: Positive for cough and shortness of breath. Negative for apnea, hemoptysis, choking, chest tightness, wheezing and stridor.   Cardiovascular: Negative for chest pain, palpitations and leg swelling.  Gastrointestinal: Negative for heartburn, nausea, vomiting, abdominal pain, diarrhea, constipation, blood in stool, abdominal distention and rectal pain.  Genitourinary: Negative for dysuria, urgency, frequency, hematuria, flank pain, decreased urine volume, vaginal bleeding, vaginal discharge, enuresis, difficulty urinating, genital sores, vaginal pain, pelvic pain and dyspareunia.  Musculoskeletal: Negative for myalgias, back pain, joint swelling, arthralgias and gait problem.  Skin: Negative for color change, pallor, rash and wound.  Neurological: Negative for dizziness, tremors, seizures, syncope, facial asymmetry, speech difficulty, weakness, light-headedness,  numbness and headaches.  Hematological: Negative for adenopathy. Does not bruise/bleed easily.  Psychiatric/Behavioral: Negative.        Objective:   Physical Exam  Vitals reviewed. Constitutional: She is oriented to person, place, and time. She appears well-developed and well-nourished. No distress.  HENT:  Right Ear: External ear normal.  Mouth/Throat: Oropharynx is clear and moist. No oropharyngeal exudate.  Eyes: Conjunctivae are normal. Right eye exhibits no discharge. Left eye exhibits no discharge. No scleral icterus.  Neck: Normal range of motion. Neck supple. No JVD present. No tracheal deviation present. No thyromegaly present.  Cardiovascular: Normal rate, regular rhythm, normal heart sounds and intact distal pulses.  Exam reveals no gallop and no friction rub.   No murmur heard. Pulmonary/Chest: Effort normal and breath sounds normal. No stridor. No respiratory distress. She has no wheezes. She has no rales. She exhibits no tenderness.  Abdominal: Soft. Bowel sounds are normal. She exhibits no distension and no mass. There is no tenderness. There is no rebound and no guarding.  Musculoskeletal: Normal range of motion. She exhibits no edema and no tenderness.  Lymphadenopathy:    She has no cervical adenopathy.  Neurological: She is oriented to person, place, and time. She displays normal reflexes. No cranial nerve deficit. She exhibits normal muscle tone. Coordination normal.  Skin: Skin is warm and dry. No rash noted. She is not diaphoretic. No erythema. No pallor.  Psychiatric: She has a normal mood and affect. Her behavior is normal. Judgment and thought content normal.      Lab Results  Component Value Date   WBC 7.2 09/20/2010   HGB 11.6* 09/20/2010   HCT 34.0* 09/20/2010   PLT 336 09/20/2010   CHOL 152 09/18/2010   TRIG 71 09/18/2010   HDL 60 09/18/2010   ALT 17 04/10/8117   AST 13 09/18/2010   NA 138 09/20/2010  K 3.6 09/20/2010   CL 99 09/20/2010   CREATININE 0.89  09/20/2010   BUN 12 09/20/2010   CO2 30 09/20/2010   TSH 0.433 09/18/2010   INR 1.11 09/18/2010   HGBA1C 9.1* 09/18/2010   MICROALBUR 2.45* 03/15/2007      Assessment & Plan:

## 2010-09-26 NOTE — Assessment & Plan Note (Signed)
Will have the stop the ACEI due to the cough, will start an ARB

## 2010-09-26 NOTE — Assessment & Plan Note (Signed)
I will recheck her CXR today to see if there has been a resolution

## 2010-09-26 NOTE — Telephone Encounter (Signed)
Pt calling regarding Dr. [name] wanting to change pt [name] to [facility]. Pt would like to discuss this with [name] prior to pt changing.  Pt was in hospital for pseumonia and MD said sometime [name] can make pts cough and therefore wanted to change pt to [facility].  Please return pt call to discuss further.

## 2010-09-26 NOTE — Telephone Encounter (Signed)
Per pt - she is concerned and wants to know if it is OK for her to stop Altace as instructed by Dr Yetta Barre.  Informed pt that it is fine for her to change to Benicar and may be very beneficial for her to stop the continued cough she has been having. Pt does not feel as though her cough is related to the Altace however states that she will try it.  She will call back if any questions or concerns.

## 2010-09-26 NOTE — Assessment & Plan Note (Signed)
No changes

## 2010-09-26 NOTE — Assessment & Plan Note (Signed)
Will stop the ACEI and start benicar

## 2010-09-28 ENCOUNTER — Other Ambulatory Visit (HOSPITAL_COMMUNITY): Payer: Medicare Other

## 2010-09-29 NOTE — Discharge Summary (Signed)
[name], [name] NO.:  [id number]  MEDICAL RECORD NO.:  [id number]  LOCATION:  5502                         FACILITY:  [facility]  PHYSICIAN:  Hartley Barefoot, MD    DATE OF BIRTH:  [birth year]  DATE OF ADMISSION:  09/17/2010 DATE OF DISCHARGE:  09/20/2010                              DISCHARGE SUMMARY   DISCHARGE DIAGNOSES: 1. Acute respiratory failure secondary to right lower lobe pneumonia. 2. Right lower lobe pneumonia. 3. Other past medical history of diabetes type 2. 4. History of congestive heart failure, ejection fraction 45%. 5. Constipation. 6. Hypertension. 7. Depression.  DISCHARGE MEDICATIONS: 1. Albuterol 2 puffs inhale every 6 hours as needed. 2. Guaifenesin 1200 mg p.o. b.i.d. 3. Levaquin 750 mg for 5 more days. 4. NovoLog 10 units subcutaneous 3 times a day with meals as needed. 5. Ramipril 10 mg p.o. daily. 6. Aspirin 81 mg p.o. daily. 7. Amlodipine and atorvastatin 10/40 mg 1 tablet by mouth every     morning. 8. Carvedilol 25 mg one and half tablet by mouth twice daily. 9. Effexor 150 mg 1 tablet by mouth twice daily. 10.Furosemide 80 mg every morning. 11.Lantus 60 units subcutaneous daily at bedtime. 12.Methocarbamol 500 mg p.o. daily. 13.MiraLax 17 g p.o. daily or twice a day as needed. 14.OxyContin 10 mg 1 tablet daily as needed. 15.Protonix 5 mg p.o. b.i.d. 16.Vitamin D3 1000 units 1 tablet daily. 17.Vitamin E 400 units p.o. daily.  DISPOSITION AND FOLLOWUP:  [name] will need to follow with her Primary Care Physician within a week to follow respiratory status, heart failure, diabetes, and resolution for pneumonia.  BRIEF HISTORY OF PRESENT ILLNESS:  This is a very pleasant 65 year old with past medical history significant for hyperlipidemia; nonobstructive coronary artery disease; obstructive sleep apnea; osteoarthritis; diabetes type 2; systolic congestive heart failure, who presents to the emergency department  complaining of shortness of breath, pleuritic chest pain, cough.  For further details please refer to HPI on admission September 15.  HOSPITAL COURSE: 1. Acute respiratory failure, hypercapnic, hypoxic.  The patient     required BiPAP in the emergency department.  She was admitted to     the step-down unit; however, her respiratory status improved.  The     patient was started on Levaquin, vancomycin, and clindamycin.     Subsequently she was transitioned to Levaquin.  She was found to     have a right lower lobe pneumonia.  Her respiratory symptoms has     improved.  She has remained afebrile.  White count normal,     decreased from 12 to 7.1 on the day of discharge. 2. Diabetes type 2.  We will continue with her home dose of Lantus 60     units and a sliding scale insulin.  She will need to follow with     her Primary Care Physician for further adjustments. 3. Constipation.  The patient has not had a bowel movement since     Sunday.  She has some mild nausea, but denies abdominal pain.  She     is passing gas.  She will receive a dose of MiraLax.  We will check     a KUB,  if negative we will discharge her today.  DISPOSITION:  We were going to order PT/OT, oxygen on ambulation, evaluation.      Hartley Barefoot, MD     BR/MEDQ  D:  09/20/2010  T:  09/20/2010  Job:  [id number]  Electronically Signed by Hartley Barefoot MD on 09/29/2010 07:56:00 PM

## 2010-10-04 ENCOUNTER — Ambulatory Visit: Payer: Medicare Other | Admitting: Cardiology

## 2010-10-07 ENCOUNTER — Telehealth: Payer: Self-pay | Admitting: *Deleted

## 2010-10-07 NOTE — Telephone Encounter (Signed)
1. Patient requesting results of cxr.  2. Pt continues to have bleeding with BM's.

## 2010-10-07 NOTE — Telephone Encounter (Signed)
1. Spoke w/pt - She c/o bleeding after BM's x 1 day. Bleeding is not heavy per pt's report and only happened x 2. She declined OV tomorrow at [facility]. Pt has had constipation recently but is resolved b/c she took stool softener. Advised her to call on-call services w/any questions or reoccurrence in symptoms during the weekend OR go to ER - She agreed

## 2010-10-07 NOTE — Telephone Encounter (Signed)
#  1 CXR normal #2 needs to be seen

## 2010-10-10 ENCOUNTER — Encounter: Payer: Self-pay | Admitting: Cardiology

## 2010-10-10 ENCOUNTER — Ambulatory Visit (INDEPENDENT_AMBULATORY_CARE_PROVIDER_SITE_OTHER): Payer: Medicare Other | Admitting: Cardiology

## 2010-10-10 VITALS — BP 136/74 | HR 74 | Ht 70.0 in | Wt 241.0 lb

## 2010-10-10 DIAGNOSIS — I10 Essential (primary) hypertension: Secondary | ICD-10-CM

## 2010-10-10 DIAGNOSIS — I5022 Chronic systolic (congestive) heart failure: Secondary | ICD-10-CM

## 2010-10-10 DIAGNOSIS — I509 Heart failure, unspecified: Secondary | ICD-10-CM

## 2010-10-10 MED ORDER — CARVEDILOL 25 MG PO TABS: 37.5000 mg | ORAL_TABLET | Freq: Two times a day (BID) | ORAL | Status: DC

## 2010-10-10 NOTE — Progress Notes (Signed)
HPI The patient returns for followup of hypertension dyspnea and chest discomfort.  Since I last saw her she was in the hospital with pneumonia.  Since having this treated she has been breathing much much better. The patient denies any new symptoms such as chest discomfort, neck or arm discomfort. There has been no new shortness of breath, PND or orthopnea. There have been no reported palpitations, presyncope or syncope.  She is starting to have some right flank pain although she is not having any dysuria, pyuria, fevers or chills.  Allergies  Allergen Reactions  . Ramipril Cough  . Adhesive (Tape)   . Codeine   . Penicillins     Current Outpatient Prescriptions  Medication Sig Dispense Refill  . albuterol (VENTOLIN HFA) 108 (90 BASE) MCG/ACT inhaler Inhale 2 puffs into the lungs every 6 (six) hours as needed.        [name] amlodipine-atorvastatin (CADUET) 10-20 MG per tablet Take 1 tablet by mouth daily.  90 tablet  3  . aspirin 81 MG tablet Take 81 mg by mouth daily.        . bisacodyl (DULCOLAX) 5 MG EC tablet Take 5 mg by mouth daily as needed.        . carvedilol (COREG) 25 MG tablet Take one and one half tablet twice daily. PT WANTS BRAND ONLY        . Cholecalciferol (VITAMIN D) 2000 UNITS CAPS Take by mouth.        . Colesevelam HCl 3.75 G PACK Take 1 each by mouth daily.  30 each  11  . ferrous fumarate (FERRETTS) 325 (106 FE) MG TABS Take 325 mg by mouth daily as needed.       . furosemide (LASIX) 80 MG tablet Take 1 tablet (80 mg total) by mouth daily.  90 tablet  3  . insulin aspart (NOVOLOG) 100 UNIT/ML injection Inject into the skin. 10 units at breakfast 10units at lunch 10 units at dinner      . insulin glargine (LANTUS) 100 UNIT/ML injection Inject 60 Units into the skin at bedtime.       . metFORMIN (GLUMETZA) 500 MG (MOD) 24 hr tablet Take 500 mg by mouth 2 (two) times daily with a meal.        . olmesartan (BENICAR) 40 MG tablet Take 1 tablet (40 mg total) by mouth daily.   70 tablet  0  . oxyCODONE-acetaminophen (PERCOCET) 7.5-325 MG per tablet Take 1 tablet by mouth every 4 (four) hours as needed for pain.  75 tablet  0  . pantoprazole (PROTONIX) 40 MG tablet Take 40 mg by mouth daily.        . Polyethylene Glycol 3350 (MIRALAX PO) Take by mouth. As directed        . potassium chloride (MICRO-K) 10 MEQ CR capsule Take 10 mEq by mouth daily.        . traZODone (DESYREL) 150 MG tablet Take 150 mg by mouth at bedtime.        [name] venlafaxine (EFFEXOR XR) 150 MG 24 hr capsule Take 150 mg by mouth 2 (two) times daily.          Past Medical History  Diagnosis Date  . DM 02/21/2006  . DYSLIPIDEMIA 04/23/2008  . OBESITY NOS 04/25/2006  . DEPRESSION, RECURRENT, IN PARTIAL REMISSION 03/08/2006  . HYPERTENSION 11/07/2005  . CORONARY ARTERY DISEASE 07/09/2000  . CONGESTIVE HEART FAILURE 12/11/2005  . COPD 11/07/2005  . GERD 11/07/2005  . DIVERTICULOSIS,  COLON, WITH HEMORRHAGE 03/17/2001  . BACK PAIN, LUMBAR, WITH RADICULOPATHY 03/07/2006  . ABNORMAL ELECTROCARDIOGRAM 06/16/2009  . OBSTRUCTIVE SLEEP APNEA 01/10/2010  . Asthma     Past Surgical History  Procedure Date  . Uvuloplasty   . Total abdominal hysterectomy w/ bilateral salpingoophorectomy   . Abdominal hysterectomy   . Cholecystectomy     ROS As stated in the HPI and negative for all other systems.  PHYSICAL EXAM BP 136/74  Pulse 74  Ht 5\' 10"  (1.778 m)  Wt 241 lb (109.317 kg)  BMI 34.58 kg/m2 GENERAL:  Well appearing HEENT:  Pupils equal round and reactive, fundi not visualized, oral mucosa unremarkable NECK:  No jugular venous distention, waveform within normal limits, carotid upstroke brisk and symmetric, no bruits, no thyromegaly LYMPHATICS:  No cervical, inguinal adenopathy LUNGS:  Clear to auscultation bilaterally BACK:  No CVA tenderness CHEST:  Unremarkable HEART:  PMI not displaced or sustained,S1 and S2 within normal limits, no S3, no S4, no clicks, no rubs, no murmurs ABD:  Flat, positive bowel  sounds normal in frequency in pitch, no bruits, no rebound, no guarding, no midline pulsatile mass, no hepatomegaly, no splenomegaly, obese EXT:  2 plus pulses throughout, no edema, no cyanosis no clubbing SKIN:  No rashes no nodules NEURO:  Cranial nerves II through XII grossly intact, motor grossly intact throughout PSYCH:  Cognitively intact, oriented to person place and time  ASSESSMENT AND PLAN

## 2010-10-10 NOTE — Assessment & Plan Note (Signed)
Her ACE inhibitor was stopped in the hospital recently.  Since her BP is ok, I will keep her off of this.  She will follow her BP and let me know if it increases.

## 2010-10-10 NOTE — Patient Instructions (Signed)
The current medical regimen is effective;  continue present plan and medications.  Follow up in 6 months with Dr Hochrein.  You will receive a letter in the mail 2 months before you are due.  Please call us when you receive this letter to schedule your follow up appointment.  

## 2010-10-10 NOTE — Assessment & Plan Note (Signed)
She seems to be euvolemic.  At this point, no change in therapy is indicated.  We have reviewed salt and fluid restrictions.  No further cardiovascular testing is indicated.   

## 2010-10-11 ENCOUNTER — Telehealth: Payer: Self-pay | Admitting: *Deleted

## 2010-10-11 NOTE — Telephone Encounter (Signed)
She needs to decrease lantus to 40 u and f/up with me in 2-3 weeks

## 2010-10-11 NOTE — Telephone Encounter (Signed)
Spoke w/patient. She woke up at 4 am sweating and felt "jittery" - CBG was 36, she drank coffee with sugar and ate a banana, cbg now is 136.  Current diabetic meds are: Metformin 500 mg bid Lantus 60 units HS Novolog 10 units HS (if cbg is over 75)  Novolog on sliding scale w/meals   Recently fasting blood sugar has been in the 70's and 80's. I advised patient to continued to monitor blood sugar closely today. Should she make any changes to current plan? Does she need OV? Please advise.

## 2010-10-11 NOTE — Telephone Encounter (Signed)
Patient informed, med list updated.

## 2010-10-20 ENCOUNTER — Ambulatory Visit (INDEPENDENT_AMBULATORY_CARE_PROVIDER_SITE_OTHER): Payer: Medicare Other | Admitting: Internal Medicine

## 2010-10-20 ENCOUNTER — Encounter: Payer: Self-pay | Admitting: Internal Medicine

## 2010-10-20 VITALS — BP 104/56 | HR 88 | Temp 98.0°F | Resp 16 | Wt 237.8 lb

## 2010-10-20 DIAGNOSIS — I10 Essential (primary) hypertension: Secondary | ICD-10-CM

## 2010-10-20 DIAGNOSIS — E119 Type 2 diabetes mellitus without complications: Secondary | ICD-10-CM

## 2010-10-20 DIAGNOSIS — E785 Hyperlipidemia, unspecified: Secondary | ICD-10-CM

## 2010-10-20 MED ORDER — SITAGLIP PHOS-METFORMIN HCL ER 50-1000 MG PO TB24: 1.0000 | ORAL_TABLET | Freq: Every day | ORAL | Status: DC

## 2010-10-20 NOTE — Assessment & Plan Note (Signed)
Her most recent A1C= 9.1 so I have augmented her treatment with janumet

## 2010-10-20 NOTE — Assessment & Plan Note (Signed)
Her BP is well controlled 

## 2010-10-20 NOTE — Progress Notes (Signed)
Subjective:    Patient ID: [name], female    DOB: May 28, 1945, 65 y.o.   MRN: [id number]  Diabetes She presents for her follow-up diabetic visit. She has type 2 diabetes mellitus. Her disease course has been stable. There are no hypoglycemic associated symptoms. Pertinent negatives for hypoglycemia include no dizziness, headaches, seizures, speech difficulty or tremors. Pertinent negatives for diabetes include no blurred vision, no chest pain, no fatigue, no foot paresthesias, no foot ulcerations, no polydipsia, no polyphagia, no polyuria, no visual change, no weakness and no weight loss. There are no hypoglycemic complications. Symptoms are stable. There are no diabetic complications. Current diabetic treatment includes intensive insulin program and oral agent (dual therapy). She is compliant with treatment all of the time. Her weight is stable. She is following a generally healthy diet. Meal planning includes avoidance of concentrated sweets. She has not had a previous visit with a dietician. She never participates in exercise. Her home blood glucose trend is increasing steadily. Her breakfast blood glucose range is generally 110-130 mg/dl. Her lunch blood glucose range is generally 130-140 mg/dl. Her dinner blood glucose range is generally 140-180 mg/dl. Her highest blood glucose is >200 mg/dl. Her overall blood glucose range is 140-180 mg/dl. An ACE inhibitor/angiotensin II receptor blocker is being taken. She does not see a podiatrist.Eye exam is current.      Review of Systems  Constitutional: Negative for fever, chills, weight loss, diaphoresis, activity change, appetite change, fatigue and unexpected weight change.  HENT: Negative.   Eyes: Negative.  Negative for blurred vision.  Respiratory: Negative for apnea, cough, choking, chest tightness, shortness of breath, wheezing and stridor.   Cardiovascular: Negative for chest pain, palpitations and leg swelling.  Gastrointestinal:  Negative for nausea, vomiting, abdominal pain, diarrhea, constipation, blood in stool, abdominal distention and anal bleeding.  Genitourinary: Negative.  Negative for polyuria.  Musculoskeletal: Negative for myalgias, back pain, joint swelling, arthralgias and gait problem.  Skin: Negative.   Neurological: Negative for dizziness, tremors, seizures, syncope, facial asymmetry, speech difficulty, weakness, light-headedness, numbness and headaches.  Hematological: Negative for polydipsia, polyphagia and adenopathy. Does not bruise/bleed easily.  Psychiatric/Behavioral: Negative.        Objective:   Physical Exam  Vitals reviewed. Constitutional: She is oriented to person, place, and time. She appears well-developed and well-nourished. No distress.  HENT:  Head: Normocephalic and atraumatic.  Mouth/Throat: Oropharynx is clear and moist. No oropharyngeal exudate.  Eyes: Conjunctivae are normal. Right eye exhibits no discharge. Left eye exhibits no discharge. No scleral icterus.  Neck: Normal range of motion. Neck supple. No JVD present. No tracheal deviation present. No thyromegaly present.  Cardiovascular: Normal rate, regular rhythm, normal heart sounds and intact distal pulses.  Exam reveals no gallop and no friction rub.   No murmur heard. Pulmonary/Chest: Effort normal and breath sounds normal. No stridor. No respiratory distress. She has no wheezes. She has no rales. She exhibits no tenderness.  Abdominal: Soft. Bowel sounds are normal. She exhibits no distension and no mass. There is no tenderness. There is no rebound and no guarding.  Musculoskeletal: Normal range of motion. She exhibits no edema and no tenderness.  Lymphadenopathy:    She has no cervical adenopathy.  Neurological: She is oriented to person, place, and time. She displays normal reflexes. She exhibits normal muscle tone. Coordination normal.  Skin: Skin is warm and dry. No rash noted. She is not diaphoretic. No erythema.  No pallor.  Psychiatric: She has a normal mood and  affect. Her behavior is normal. Judgment and thought content normal.      Lab Results  Component Value Date   WBC 7.2 09/20/2010   HGB 11.6* 09/20/2010   HCT 34.0* 09/20/2010   PLT 336 09/20/2010   GLUCOSE 181* 09/20/2010   CHOL 152 09/18/2010   TRIG 71 09/18/2010   HDL 60 09/18/2010   LDLCALC 78 09/18/2010   ALT 17 09/18/2010   AST 13 09/18/2010   NA 138 09/20/2010   K 3.6 09/20/2010   CL 99 09/20/2010   CREATININE 0.89 09/20/2010   BUN 12 09/20/2010   CO2 30 09/20/2010   TSH 0.433 09/18/2010   INR 1.11 09/18/2010   HGBA1C 9.1* 09/18/2010   MICROALBUR 2.45* 03/15/2007      Assessment & Plan:

## 2010-10-20 NOTE — Assessment & Plan Note (Signed)
Continue atorvastatin and welchol

## 2010-10-20 NOTE — Patient Instructions (Signed)

## 2010-11-03 ENCOUNTER — Other Ambulatory Visit: Payer: Self-pay | Admitting: Internal Medicine

## 2010-12-15 ENCOUNTER — Encounter: Payer: Self-pay | Admitting: Internal Medicine

## 2010-12-15 ENCOUNTER — Ambulatory Visit (INDEPENDENT_AMBULATORY_CARE_PROVIDER_SITE_OTHER)
Admission: RE | Admit: 2010-12-15 | Discharge: 2010-12-15 | Disposition: A | Discharge status: Home / Self Care | Payer: Medicare Other | Source: Ambulatory Visit | Attending: Internal Medicine | Admitting: Internal Medicine

## 2010-12-15 ENCOUNTER — Ambulatory Visit (INDEPENDENT_AMBULATORY_CARE_PROVIDER_SITE_OTHER): Payer: Medicare Other | Admitting: Internal Medicine

## 2010-12-15 ENCOUNTER — Ambulatory Visit: Payer: Medicare Other

## 2010-12-15 VITALS — BP 118/60 | HR 82 | Temp 97.9°F | Resp 20 | Wt 242.0 lb

## 2010-12-15 DIAGNOSIS — M545 Low back pain: Secondary | ICD-10-CM

## 2010-12-15 DIAGNOSIS — J189 Pneumonia, unspecified organism: Secondary | ICD-10-CM

## 2010-12-15 DIAGNOSIS — D649 Anemia, unspecified: Secondary | ICD-10-CM

## 2010-12-15 DIAGNOSIS — E119 Type 2 diabetes mellitus without complications: Secondary | ICD-10-CM

## 2010-12-15 DIAGNOSIS — M609 Myositis, unspecified: Secondary | ICD-10-CM

## 2010-12-15 DIAGNOSIS — I509 Heart failure, unspecified: Secondary | ICD-10-CM

## 2010-12-15 DIAGNOSIS — J449 Chronic obstructive pulmonary disease, unspecified: Secondary | ICD-10-CM

## 2010-12-15 DIAGNOSIS — R0989 Other specified symptoms and signs involving the circulatory and respiratory systems: Secondary | ICD-10-CM

## 2010-12-15 DIAGNOSIS — R0602 Shortness of breath: Secondary | ICD-10-CM

## 2010-12-15 DIAGNOSIS — IMO0002 Reserved for concepts with insufficient information to code with codable children: Secondary | ICD-10-CM | POA: Insufficient documentation

## 2010-12-15 DIAGNOSIS — R05 Cough: Secondary | ICD-10-CM

## 2010-12-15 DIAGNOSIS — D509 Iron deficiency anemia, unspecified: Secondary | ICD-10-CM | POA: Insufficient documentation

## 2010-12-15 DIAGNOSIS — R0609 Other forms of dyspnea: Secondary | ICD-10-CM

## 2010-12-15 DIAGNOSIS — I10 Essential (primary) hypertension: Secondary | ICD-10-CM

## 2010-12-15 DIAGNOSIS — M5416 Radiculopathy, lumbar region: Secondary | ICD-10-CM | POA: Insufficient documentation

## 2010-12-15 LAB — CBC WITH DIFFERENTIAL/PLATELET
Basophils Relative: 0.4 % (ref 0.0–3.0)
Eosinophils Relative: 2.2 % (ref 0.0–5.0)
Lymphocytes Relative: 39 % (ref 12.0–46.0)
Monocytes Relative: 6.8 % (ref 3.0–12.0)
Neutrophils Relative %: 51.6 % (ref 43.0–77.0)
RBC: 3.89 Mil/uL (ref 3.87–5.11)
WBC: 7.7 10*3/uL (ref 4.5–10.5)

## 2010-12-15 LAB — COMPREHENSIVE METABOLIC PANEL
Albumin: 3.7 g/dL (ref 3.5–5.2)
Alkaline Phosphatase: 97 U/L (ref 39–117)
CO2: 32 mEq/L (ref 19–32)
GFR: 65.79 mL/min (ref >60.00)
Glucose, Bld: 90 mg/dL (ref 70–99)
Potassium: 3.5 mEq/L (ref 3.5–5.1)
Sodium: 140 mEq/L (ref 135–145)
Total Protein: 7.3 g/dL (ref 6.0–8.3)

## 2010-12-15 LAB — CARDIAC PANEL
CK-MB: 2.7 ng/mL (ref 0.3–4.0)
Total CK: 75 U/L (ref 7–177)

## 2010-12-15 LAB — TSH: TSH: 2.5 u[IU]/mL (ref 0.35–5.50)

## 2010-12-15 LAB — CK: Total CK: 75 U/L (ref 7–177)

## 2010-12-15 LAB — IBC PANEL: Iron: 56 ug/dL (ref 42–145)

## 2010-12-15 LAB — SEDIMENTATION RATE: Sed Rate: 81 mm/hr — ABNORMAL HIGH (ref 0–22)

## 2010-12-15 MED ORDER — ACLIDINIUM BROMIDE 400 MCG/ACT IN AEPB: 1.0000 | INHALATION_SPRAY | Freq: Two times a day (BID) | RESPIRATORY_TRACT | Status: DC

## 2010-12-15 MED ORDER — OXYCODONE-ACETAMINOPHEN 7.5-325 MG PO TABS: 1.0000 | ORAL_TABLET | ORAL | Status: DC | PRN

## 2010-12-15 NOTE — Assessment & Plan Note (Signed)
I will check a BNP to see if she has fluid overload, Her EKG today shows non-specific T wave changes that have been present on prior EKG's for her

## 2010-12-15 NOTE — Assessment & Plan Note (Signed)
She has stopped using spiriva and it sounds like her COPD is worsening so I have asked her to try [place]

## 2010-12-15 NOTE — Assessment & Plan Note (Signed)
She is due for an a1c and I will check her renal function

## 2010-12-15 NOTE — Assessment & Plan Note (Signed)
I will check a CPK and ESR today

## 2010-12-15 NOTE — Assessment & Plan Note (Signed)
I will recheck her CBC and will look at her iron studies, I tried to order B12 and folate levels but the software blocked it and told me that her [facility] does not cover it

## 2010-12-15 NOTE — Assessment & Plan Note (Signed)
EKG is non-specific, I will check labs and cardiac enzymes to look for causes or her worsening DOE

## 2010-12-15 NOTE — Assessment & Plan Note (Signed)
This is stable, med refilled today

## 2010-12-15 NOTE — Progress Notes (Signed)
Subjective:    Patient ID: [name], female    DOB: 1945-05-24, 65 y.o.   MRN: [id number]  Cough This is a recurrent problem. The current episode started in the past 7 days. The problem has been gradually worsening. The problem occurs every few minutes. The cough is non-productive. Associated symptoms include chills, myalgias, nasal congestion, shortness of breath and wheezing. Pertinent negatives include no chest pain, ear congestion, ear pain, fever, headaches, heartburn, hemoptysis, postnasal drip, rash, rhinorrhea, sore throat, sweats or weight loss. The symptoms are aggravated by cold air. She has tried a beta-agonist inhaler for the symptoms. The treatment provided mild relief. Her past medical history is significant for COPD and pneumonia.      Review of Systems  Constitutional: Positive for chills. Negative for fever, weight loss, diaphoresis, activity change, appetite change, fatigue and unexpected weight change.  HENT: Negative for ear pain, sore throat, facial swelling, rhinorrhea, sneezing, trouble swallowing, neck pain, neck stiffness, voice change, postnasal drip and sinus pressure.   Eyes: Negative.   Respiratory: Positive for cough, shortness of breath and wheezing. Negative for hemoptysis, chest tightness and stridor.   Cardiovascular: Negative for chest pain.  Gastrointestinal: Negative for heartburn, nausea, vomiting, abdominal pain, diarrhea, constipation and abdominal distention.  Genitourinary: Negative.   Musculoskeletal: Positive for myalgias and back pain. Negative for joint swelling, arthralgias (chronic, unchanged) and gait problem.  Skin: Negative for color change, pallor, rash and wound.  Neurological: Negative for dizziness, tremors, seizures, syncope, facial asymmetry, speech difficulty, weakness, light-headedness, numbness and headaches.  Hematological: Negative for adenopathy. Does not bruise/bleed easily.  Psychiatric/Behavioral: Negative.          Objective:   Physical Exam  Vitals reviewed. Constitutional: She is oriented to person, place, and time. She appears well-developed and well-nourished. No distress.  HENT:  Head: Normocephalic and atraumatic.  Mouth/Throat: Oropharynx is clear and moist. No oropharyngeal exudate.  Eyes: Conjunctivae are normal. Right eye exhibits no discharge. Left eye exhibits no discharge. No scleral icterus.  Neck: Normal range of motion. Neck supple. No JVD present. No tracheal deviation present. No thyromegaly present.  Cardiovascular: Normal rate, regular rhythm, normal heart sounds and intact distal pulses.  Exam reveals no gallop and no friction rub.   No murmur heard. Pulmonary/Chest: Effort normal and breath sounds normal. No stridor. No respiratory distress. She has no wheezes. She has no rales. She exhibits no tenderness.  Abdominal: Soft. Bowel sounds are normal. She exhibits no distension and no mass. There is no tenderness. There is no rebound and no guarding.  Musculoskeletal: Normal range of motion. She exhibits edema (1+ edema in both legs). She exhibits no tenderness.  Lymphadenopathy:    She has no cervical adenopathy.  Neurological: She is oriented to person, place, and time.  Skin: Skin is warm and dry. No rash noted. She is not diaphoretic. No erythema. No pallor.  Psychiatric: She has a normal mood and affect. Her behavior is normal. Judgment and thought content normal.     Lab Results  Component Value Date   WBC 7.2 09/20/2010   HGB 11.6* 09/20/2010   HCT 34.0* 09/20/2010   PLT 336 09/20/2010   GLUCOSE 181* 09/20/2010   CHOL 152 09/18/2010   TRIG 71 09/18/2010   HDL 60 09/18/2010   LDLCALC 78 09/18/2010   ALT 17 09/18/2010   AST 13 09/18/2010   NA 138 09/20/2010   K 3.6 09/20/2010   CL 99 09/20/2010   CREATININE 0.89 09/20/2010  BUN 12 09/20/2010   CO2 30 09/20/2010   TSH 0.433 09/18/2010   INR 1.11 09/18/2010   HGBA1C 9.1* 09/18/2010   MICROALBUR 2.45* 03/15/2007        Assessment & Plan:

## 2010-12-15 NOTE — Assessment & Plan Note (Signed)
I will check her CXR to see if there is edema, pna, mass, etc.

## 2010-12-15 NOTE — Patient Instructions (Signed)

## 2010-12-15 NOTE — Assessment & Plan Note (Signed)
Recheck CXR today. 

## 2010-12-16 ENCOUNTER — Encounter: Payer: Self-pay | Admitting: Internal Medicine

## 2010-12-21 ENCOUNTER — Telehealth: Payer: Self-pay

## 2010-12-26 NOTE — Telephone Encounter (Signed)
Error

## 2010-12-30 ENCOUNTER — Other Ambulatory Visit: Payer: Self-pay | Admitting: *Deleted

## 2010-12-30 MED ORDER — PANTOPRAZOLE SODIUM 40 MG PO TBEC: 40.0000 mg | DELAYED_RELEASE_TABLET | Freq: Every day | ORAL | Status: DC

## 2011-01-05 ENCOUNTER — Ambulatory Visit: Payer: Medicare Other | Admitting: Internal Medicine

## 2011-01-30 ENCOUNTER — Encounter: Payer: Self-pay | Admitting: Internal Medicine

## 2011-01-30 ENCOUNTER — Ambulatory Visit (INDEPENDENT_AMBULATORY_CARE_PROVIDER_SITE_OTHER): Payer: Medicare Other | Admitting: Internal Medicine

## 2011-01-30 ENCOUNTER — Ambulatory Visit (INDEPENDENT_AMBULATORY_CARE_PROVIDER_SITE_OTHER)
Admission: RE | Admit: 2011-01-30 | Discharge: 2011-01-30 | Disposition: A | Discharge status: Home / Self Care | Payer: Medicare Other | Source: Ambulatory Visit | Attending: Internal Medicine | Admitting: Internal Medicine

## 2011-01-30 VITALS — BP 120/60 | HR 72 | Temp 97.5°F | Resp 20

## 2011-01-30 DIAGNOSIS — K589 Irritable bowel syndrome without diarrhea: Secondary | ICD-10-CM | POA: Insufficient documentation

## 2011-01-30 DIAGNOSIS — M545 Low back pain: Secondary | ICD-10-CM

## 2011-01-30 DIAGNOSIS — E785 Hyperlipidemia, unspecified: Secondary | ICD-10-CM

## 2011-01-30 DIAGNOSIS — R10817 Generalized abdominal tenderness: Secondary | ICD-10-CM | POA: Insufficient documentation

## 2011-01-30 DIAGNOSIS — M5416 Radiculopathy, lumbar region: Secondary | ICD-10-CM

## 2011-01-30 DIAGNOSIS — M542 Cervicalgia: Secondary | ICD-10-CM | POA: Insufficient documentation

## 2011-01-30 DIAGNOSIS — I10 Essential (primary) hypertension: Secondary | ICD-10-CM

## 2011-01-30 DIAGNOSIS — IMO0002 Reserved for concepts with insufficient information to code with codable children: Secondary | ICD-10-CM

## 2011-01-30 MED ORDER — LINACLOTIDE 290 MCG PO CAPS: 1.0000 | ORAL_CAPSULE | Freq: Every day | ORAL | Status: DC

## 2011-01-30 MED ORDER — OXYCODONE-ACETAMINOPHEN 7.5-325 MG PO TABS: 1.0000 | ORAL_TABLET | ORAL | Status: DC | PRN

## 2011-01-30 MED ORDER — ATORVASTATIN CALCIUM 40 MG PO TABS: 40.0000 mg | ORAL_TABLET | Freq: Every day | ORAL | Status: DC

## 2011-01-30 NOTE — Progress Notes (Signed)
Subjective:    Patient ID: [name], female    DOB: 16-Jul-1945, 66 y.o.   MRN: [id number]  Abdominal Pain This is a recurrent problem. The current episode started 1 to 4 weeks ago. The onset quality is gradual. The problem occurs intermittently. The problem has been unchanged. The pain is located in the generalized abdominal region. The pain is at a severity of 1/10. The quality of the pain is a sensation of fullness. The abdominal pain does not radiate. Associated symptoms include constipation. Pertinent negatives include no anorexia, arthralgias, belching, diarrhea, dysuria, fever, flatus, frequency, headaches, hematochezia, hematuria, melena, myalgias, nausea, vomiting or weight loss. The pain is aggravated by nothing. The pain is relieved by nothing. The treatment provided no relief.  Neck Pain  This is a recurrent problem. The current episode started 1 to 4 weeks ago. The problem occurs intermittently. The problem has been unchanged. The pain is associated with nothing. The pain is present in the midline. The quality of the pain is described as aching. The pain is at a severity of 3/10. The pain is mild. The symptoms are aggravated by twisting. The pain is same all the time. Pertinent negatives include no chest pain, fever, headaches, leg pain, numbness, pain with swallowing, paresis, photophobia, syncope, tingling, trouble swallowing, visual change, weakness or weight loss. She has tried oral narcotics, acetaminophen and NSAIDs for the symptoms. The treatment provided mild relief.      Review of Systems  Constitutional: Negative for fever, chills, weight loss, diaphoresis, activity change, appetite change, fatigue and unexpected weight change.  HENT: Positive for neck pain. Negative for facial swelling and trouble swallowing.   Eyes: Negative.  Negative for photophobia.  Respiratory: Positive for shortness of breath (chronic, unchanged). Negative for apnea, cough, choking, chest  tightness, wheezing and stridor.   Cardiovascular: Negative for chest pain, palpitations, leg swelling and syncope.  Gastrointestinal: Positive for abdominal pain and constipation. Negative for nausea, vomiting, diarrhea, blood in stool, melena, hematochezia, abdominal distention, anal bleeding, rectal pain, anorexia and flatus.  Genitourinary: Negative for dysuria, urgency, frequency, hematuria, flank pain, decreased urine volume, vaginal bleeding, vaginal discharge, enuresis, difficulty urinating, genital sores, vaginal pain, menstrual problem, pelvic pain and dyspareunia.  Musculoskeletal: Positive for back pain (chronic, unchanged). Negative for myalgias, joint swelling, arthralgias and gait problem.  Skin: Negative for color change, pallor, rash and wound.  Neurological: Negative for dizziness, tingling, tremors, seizures, syncope, facial asymmetry, speech difficulty, weakness, light-headedness, numbness and headaches.  Hematological: Negative for adenopathy. Does not bruise/bleed easily.  Psychiatric/Behavioral: Negative.        Objective:   Physical Exam  Vitals reviewed. Constitutional: She is oriented to person, place, and time. She appears well-developed and well-nourished. No distress.  HENT:  Head: Normocephalic and atraumatic.  Mouth/Throat: Oropharynx is clear and moist. No oropharyngeal exudate.  Eyes: Conjunctivae are normal. Right eye exhibits no discharge. Left eye exhibits no discharge. No scleral icterus.  Neck: Normal range of motion. Neck supple. No JVD present. No tracheal deviation present. No thyromegaly present.  Cardiovascular: Normal rate, regular rhythm, normal heart sounds and intact distal pulses.  Exam reveals no gallop and no friction rub.   No murmur heard. Pulmonary/Chest: Effort normal and breath sounds normal. No stridor. No respiratory distress. She has no wheezes. She has no rales. She exhibits no tenderness.  Abdominal: Soft. Bowel sounds are normal.  She exhibits distension. She exhibits no shifting dullness, no pulsatile liver, no fluid wave, no abdominal bruit, no ascites, no  pulsatile midline mass and no mass. There is no hepatosplenomegaly. There is no tenderness. There is no rebound, no guarding and no CVA tenderness. No hernia. Hernia confirmed negative in the ventral area, confirmed negative in the right inguinal area and confirmed negative in the left inguinal area.         scars  Genitourinary: Rectum normal. Rectal exam shows no external hemorrhoid, no internal hemorrhoid, no fissure, no mass, no tenderness and anal tone normal. Guaiac negative stool.  Musculoskeletal: Normal range of motion. She exhibits no edema and no tenderness.       Cervical back: Normal. She exhibits normal range of motion, no tenderness, no bony tenderness, no swelling, no edema, no deformity, no laceration, no pain and no spasm.  Lymphadenopathy:    She has no cervical adenopathy.  Neurological: She is oriented to person, place, and time.  Skin: Skin is warm and dry. No rash noted. She is not diaphoretic. No erythema. No pallor.  Psychiatric: She has a normal mood and affect. Her behavior is normal. Judgment and thought content normal.      Lab Results  Component Value Date   WBC 7.7 12/15/2010   HGB 11.5* 12/15/2010   HCT 34.7* 12/15/2010   PLT 314.0 12/15/2010   GLUCOSE 90 12/15/2010   CHOL 152 09/18/2010   TRIG 71 09/18/2010   HDL 60 09/18/2010   LDLCALC 78 09/18/2010   ALT 17 12/15/2010   AST 15 12/15/2010   NA 140 12/15/2010   K 3.5 12/15/2010   CL 100 12/15/2010   CREATININE 1.1 12/15/2010   BUN 11 12/15/2010   CO2 32 12/15/2010   TSH 2.50 12/15/2010   INR 1.11 09/18/2010   HGBA1C 8.6* 12/15/2010   MICROALBUR 2.45* 03/15/2007     Dg Chest 2 View  12/15/2010  *RADIOLOGY REPORT*  Clinical Data: Hypertension  CHEST - 2 VIEW  Comparison: Chest radiograph 09/26/2010  Findings: Normal mediastinum and heart silhouette.  Costophrenic angles  are clear.  No effusion, infiltrate, or pneumothorax. Degenerative osteophytosis of the thoracic spine.  IMPRESSION: Cardiomegaly without acute cardiopulmonary process.  Original Report Authenticated By: Genevive Bi, M.D.  Assessment & Plan:

## 2011-01-30 NOTE — Patient Instructions (Signed)

## 2011-01-30 NOTE — Assessment & Plan Note (Signed)
I will check a plain film to look for stool burden, ileus, SBO but I suspect she has IBS-C that is causing her discomfort

## 2011-02-01 ENCOUNTER — Encounter: Payer: Self-pay | Admitting: Internal Medicine

## 2011-02-01 ENCOUNTER — Telehealth: Payer: Self-pay

## 2011-02-01 NOTE — Assessment & Plan Note (Signed)
She is doing well on lipitor so she will continue it

## 2011-02-01 NOTE — Assessment & Plan Note (Signed)
Her BP is well controlled 

## 2011-02-01 NOTE — Telephone Encounter (Signed)
She has a large amount of stool in her colon

## 2011-02-01 NOTE — Telephone Encounter (Signed)
Large bone spurs

## 2011-02-01 NOTE — Telephone Encounter (Signed)
Patient request cxr results. Thanks

## 2011-02-01 NOTE — Telephone Encounter (Signed)
Sorry, forgot to ask about cervical spine scan results, Thanks

## 2011-02-01 NOTE — Assessment & Plan Note (Signed)
I will check a plain xray today to look for spondylosis, etc.

## 2011-02-01 NOTE — Assessment & Plan Note (Signed)
Plain abd xray shows lots of stool, so she will start linzess and will start lifestyle modifications to treat constipation

## 2011-02-02 NOTE — Telephone Encounter (Signed)
Patient notified per MD.

## 2011-02-02 NOTE — Telephone Encounter (Signed)
LMOVM for pt to return call 

## 2011-02-03 ENCOUNTER — Encounter (HOSPITAL_COMMUNITY): Payer: Self-pay | Admitting: *Deleted

## 2011-02-03 ENCOUNTER — Emergency Department (HOSPITAL_COMMUNITY)
Admission: EM | Admit: 2011-02-03 | Discharge: 2011-02-04 | Disposition: A | Discharge status: Home / Self Care | Payer: Medicare Other | Attending: Emergency Medicine | Admitting: Emergency Medicine

## 2011-02-03 DIAGNOSIS — K602 Anal fissure, unspecified: Secondary | ICD-10-CM | POA: Insufficient documentation

## 2011-02-03 DIAGNOSIS — I10 Essential (primary) hypertension: Secondary | ICD-10-CM | POA: Insufficient documentation

## 2011-02-03 DIAGNOSIS — Z794 Long term (current) use of insulin: Secondary | ICD-10-CM | POA: Insufficient documentation

## 2011-02-03 DIAGNOSIS — R0602 Shortness of breath: Secondary | ICD-10-CM | POA: Insufficient documentation

## 2011-02-03 DIAGNOSIS — Z9089 Acquired absence of other organs: Secondary | ICD-10-CM | POA: Insufficient documentation

## 2011-02-03 DIAGNOSIS — E119 Type 2 diabetes mellitus without complications: Secondary | ICD-10-CM | POA: Insufficient documentation

## 2011-02-03 DIAGNOSIS — R109 Unspecified abdominal pain: Secondary | ICD-10-CM | POA: Insufficient documentation

## 2011-02-03 DIAGNOSIS — R112 Nausea with vomiting, unspecified: Secondary | ICD-10-CM | POA: Insufficient documentation

## 2011-02-03 DIAGNOSIS — J4489 Other specified chronic obstructive pulmonary disease: Secondary | ICD-10-CM | POA: Insufficient documentation

## 2011-02-03 DIAGNOSIS — J449 Chronic obstructive pulmonary disease, unspecified: Secondary | ICD-10-CM

## 2011-02-03 DIAGNOSIS — I2510 Atherosclerotic heart disease of native coronary artery without angina pectoris: Secondary | ICD-10-CM | POA: Insufficient documentation

## 2011-02-03 DIAGNOSIS — K5900 Constipation, unspecified: Secondary | ICD-10-CM | POA: Insufficient documentation

## 2011-02-03 NOTE — ED Notes (Signed)
C/o cold/cough x 2 wks; c/o constipation/possible hemorrhoid; saw pcp couple of days ago and no relief of symptoms; unable to have bowel movement; felt like breathing worse tonight-hx of copd; c/o generalized weakness per EMS; decreased appetite

## 2011-02-04 ENCOUNTER — Other Ambulatory Visit: Payer: Self-pay

## 2011-02-04 ENCOUNTER — Emergency Department (HOSPITAL_COMMUNITY): Payer: Medicare Other

## 2011-02-04 LAB — DIFFERENTIAL
Basophils Absolute: 0 10*3/uL (ref 0.0–0.1)
Basophils Relative: 0 % (ref 0–1)
Eosinophils Relative: 3 % (ref 0–5)
Lymphocytes Relative: 35 % (ref 12–46)
Monocytes Absolute: 0.4 10*3/uL (ref 0.1–1.0)

## 2011-02-04 LAB — BASIC METABOLIC PANEL
BUN: 8 mg/dL (ref 6–23)
CO2: 28 mEq/L (ref 19–32)
Chloride: 96 mEq/L (ref 96–112)
Creatinine, Ser: 1.14 mg/dL — ABNORMAL HIGH (ref 0.50–1.10)
Glucose, Bld: 94 mg/dL (ref 70–99)

## 2011-02-04 LAB — CBC
HCT: 31.9 % — ABNORMAL LOW (ref 36.0–46.0)
MCHC: 32.6 g/dL (ref 30.0–36.0)
MCV: 85.8 fL (ref 78.0–100.0)
RDW: 14.4 % (ref 11.5–15.5)

## 2011-02-04 LAB — URINALYSIS, ROUTINE W REFLEX MICROSCOPIC
Bilirubin Urine: NEGATIVE
Glucose, UA: NEGATIVE mg/dL
Hgb urine dipstick: NEGATIVE
Ketones, ur: NEGATIVE mg/dL
Nitrite: NEGATIVE
Protein, ur: NEGATIVE mg/dL
Specific Gravity, Urine: 1.01 (ref 1.005–1.030)
Urobilinogen, UA: 0.2 mg/dL (ref 0.0–1.0)
pH: 5.5 (ref 5.0–8.0)

## 2011-02-04 LAB — OCCULT BLOOD, POC DEVICE: Fecal Occult Bld: NEGATIVE

## 2011-02-04 LAB — URINE MICROSCOPIC-ADD ON

## 2011-02-04 MED ORDER — IPRATROPIUM BROMIDE 0.02 % IN SOLN
0.5000 mg | Freq: Once | RESPIRATORY_TRACT | Status: AC
  Administered 2011-02-04: 0.5 mg via RESPIRATORY_TRACT
  Filled 2011-02-04: qty 2.5

## 2011-02-04 MED ORDER — ALBUTEROL SULFATE (5 MG/ML) 0.5% IN NEBU
5.0000 mg | INHALATION_SOLUTION | Freq: Once | RESPIRATORY_TRACT | Status: AC
  Administered 2011-02-04: 5 mg via RESPIRATORY_TRACT
  Filled 2011-02-04: qty 1

## 2011-02-04 NOTE — ED Provider Notes (Signed)
History     CSN: [id number]  Arrival date & time 02/03/11  2345   First MD Initiated Contact with Patient 02/04/11 0006      Chief Complaint  Patient presents with  . URI    (Consider location/radiation/quality/duration/timing/severity/associated sxs/prior treatment) Patient is a 66 y.o. female presenting with abdominal pain.  Abdominal Pain The primary symptoms of the illness include abdominal pain. The current episode started more than 2 days ago. The onset of the illness was gradual. Progression since onset: constipation, no BM for 4 days.  The patient states that she believes she is currently not pregnant. The patient has had a change in bowel habit. Symptoms associated with the illness do not include chills or anorexia. Significant associated medical issues include diabetes. Significant associated medical issues do not include inflammatory bowel disease.   Patient has additional concerns of right arm pain, which is ongoing, being treated with oxycodone, was evaluated by her PCP 4 days ago and told it was from a bone spur. This was apparently after imaging done of the neck. She also has chest pain and cough that started today. The cough is nonproductive. No fever, no anorexia, no nausea or vomiting. She is using a prescription medication (Linzess)  for constipation, as well as miralax.   Past Medical History  Diagnosis Date  . DM 02/21/2006  . DYSLIPIDEMIA 04/23/2008  . OBESITY NOS 04/25/2006  . DEPRESSION, RECURRENT, IN PARTIAL REMISSION 03/08/2006  . HYPERTENSION 11/07/2005  . CORONARY ARTERY DISEASE 07/09/2000  . CONGESTIVE HEART FAILURE 12/11/2005  . COPD 11/07/2005  . GERD 11/07/2005  . DIVERTICULOSIS, COLON, WITH HEMORRHAGE 03/17/2001  . BACK PAIN, LUMBAR, WITH RADICULOPATHY 03/07/2006  . ABNORMAL ELECTROCARDIOGRAM 06/16/2009  . OBSTRUCTIVE SLEEP APNEA 01/10/2010  . Asthma     Past Surgical History  Procedure Date  . Uvuloplasty   . Total abdominal hysterectomy w/ bilateral  salpingoophorectomy   . Abdominal hysterectomy   . Cholecystectomy     Family History  Problem Relation Age of Onset  . Stroke Mother   . Breast cancer Sister   . Emphysema Sister   . Cancer Sister     breast  . Alcohol abuse Brother   . Colon cancer      uncles x 2  . Prostate cancer Brother     History  Substance Use Topics  . Smoking status: Former Smoker    Quit date: 01/03/2007  . Smokeless tobacco: Never Used  . Alcohol Use: No    OB History    Grav Para Term Preterm Abortions TAB SAB Ect Mult Living                  Review of Systems  Constitutional: Negative for chills.  Gastrointestinal: Positive for abdominal pain. Negative for anorexia.  All other systems reviewed and are negative.    Allergies  Amlodipine; Ramipril; Adhesive; Codeine; and Penicillins  Home Medications   Current Outpatient Rx  Name Route Sig Dispense Refill  . ACLIDINIUM BROMIDE 400 MCG/ACT IN AEPB Inhalation Inhale 1 Act into the lungs 2 (two) times daily. 1 each 11  . ALBUTEROL SULFATE HFA 108 (90 BASE) MCG/ACT IN AERS Inhalation Inhale 2 puffs into the lungs every 6 (six) hours as needed.      . ASPIRIN 81 MG PO TABS Oral Take 81 mg by mouth daily.      . ATORVASTATIN CALCIUM 40 MG PO TABS Oral Take 1 tablet (40 mg total) by mouth daily. 90 tablet 3  .  BISACODYL 5 MG PO TBEC Oral Take 5 mg by mouth daily as needed.      [name] CARVEDILOL 25 MG PO TABS Oral Take 1.5 tablets (37.5 mg total) by mouth 2 (two) times daily with a meal. Take one and one half tablet twice daily. PT WANTS BRAND ONLY  90 tablet 6  . COLESEVELAM HCL 3.75 G PO PACK Oral Take 1 each by mouth daily. 30 each 11  . FUROSEMIDE 80 MG PO TABS Oral Take 1 tablet (80 mg total) by mouth daily. 90 tablet 3  . INSULIN ASPART 100 UNIT/ML [place] SOLN Subcutaneous Inject into the skin. 10 units at breakfast 10units at lunch 10 units at dinner    . INSULIN GLARGINE 100 UNIT/ML [place] SOLN Subcutaneous Inject 40 Units into the skin at  bedtime.     [name] OLMESARTAN MEDOXOMIL 40 MG PO TABS Oral Take 1 tablet (40 mg total) by mouth daily. 70 tablet 0  . OXYCODONE-ACETAMINOPHEN 7.5-325 MG PO TABS Oral Take 1 tablet by mouth every 4 (four) hours as needed for pain. 75 tablet 0  . PANTOPRAZOLE SODIUM 40 MG PO TBEC Oral Take 1 tablet (40 mg total) by mouth daily. Name Brand only 90 tablet 1  . MIRALAX PO Oral Take by mouth. As directed      . POTASSIUM CHLORIDE 10 MEQ PO CPCR Oral Take 10 mEq by mouth daily.      [name] SITAGLIPTIN-METFORMIN HCL ER 50-1000 MG PO TB24 Oral Take 1 tablet by mouth daily. 112 tablet 0  . VENLAFAXINE HCL ER 150 MG PO CP24 Oral Take 150 mg by mouth 2 (two) times daily.      . BD PEN NEEDLE SHORT U/F 31G X 8 MM MISC  USE TWICE DAILY FOR INSULIN INJECTION 180 each 3  . LINACLOTIDE 290 MCG PO CAPS Oral Take 1 tablet by mouth daily. 60 capsule 0    BP 138/78  Pulse 83  Temp(Src) 98 F (36.7 C) (Oral)  Resp 18  SpO2 99%  Physical Exam  Nursing note and vitals reviewed. Constitutional: She is oriented to person, place, and time. She appears well-developed and well-nourished.  HENT:  Head: Normocephalic and atraumatic.  Eyes: Conjunctivae and EOM are normal. Pupils are equal, round, and reactive to light.  Neck: Normal range of motion and phonation normal. Neck supple.  Cardiovascular: Normal rate, regular rhythm and intact distal pulses.   Pulmonary/Chest: Effort normal and breath sounds normal. She exhibits no tenderness.  Abdominal: Soft. She exhibits no distension. There is no tenderness. There is no guarding.  Genitourinary:       Nonbleeding posterior anal fissure, is present. No stool was in the rectal vault.  Musculoskeletal: Normal range of motion. She exhibits tenderness (mild right shoulder, tenderness, no deformity. Normal active range of motion). She exhibits no edema.       Mild posterior neck tenderness with normal range of motion.   Neurological: She is alert and oriented to person, place,  and time. She has normal strength. She exhibits normal muscle tone.  Skin: Skin is warm and dry.  Psychiatric: She has a normal mood and affect. Her behavior is normal. Judgment and thought content normal.    ED Course  Procedures (including critical care time)  Date: 02/04/2011  Rate: 67  Rhythm: normal sinus rhythm  QRS Axis: normal  Intervals: normal  ST/T Wave abnormalities: normal  Conduction Disutrbances:first-degree A-V block   Narrative Interpretation:   Old EKG Reviewed: unchanged  Labs Reviewed  BASIC METABOLIC PANEL - Abnormal; Notable for the following:    Sodium 133 (*)    Creatinine, Ser 1.14 (*)    GFR calc non Af Amer 49 (*)    GFR calc Af Amer 56 (*)    All other components within normal limits  CBC - Abnormal; Notable for the following:    RBC 3.72 (*)    Hemoglobin 10.4 (*)    HCT 31.9 (*)    All other components within normal limits  URINALYSIS, ROUTINE W REFLEX MICROSCOPIC - Abnormal; Notable for the following:    APPearance CLOUDY (*)    Leukocytes, UA SMALL (*)    All other components within normal limits  URINE MICROSCOPIC-ADD ON - Abnormal; Notable for the following:    Squamous Epithelial / LPF MANY (*)    Bacteria, UA FEW (*)    All other components within normal limits  DIFFERENTIAL  OCCULT BLOOD, POC DEVICE  URINE CULTURE  OCCULT BLOOD X 1 CARD TO LAB, STOOL   Dg Chest 2 View  02/04/2011  *RADIOLOGY REPORT*  Clinical Data: Nonproductive cough, shortness of breath, chest pain.  CHEST - 2 VIEW  Comparison: 12/15/2010  Findings: Cardiomegaly.  Central vascular congestion.  Mild right lung base opacity.  No pleural effusion or pneumothorax. Multilevel degenerative changes.  No acute osseous abnormality.  IMPRESSION: Cardiomegaly with central vascular congestion.  Mild right lung base opacity; atelectasis versus infiltrate.  Original Report Authenticated By: Waneta Martins, M.D.   Dg Abd 1 View  02/04/2011  *RADIOLOGY REPORT*  Clinical Data:  Abdominal pain, nausea, vomiting.  ABDOMEN - 1 VIEW  Comparison: 01/30/2011  Findings: Nonobstructive bowel gas pattern.  Surgical clips right upper quadrant.  Organ outlines normal where seen.  Degenerative changes of the left greater than right hip joints and lumbar spine. No acute osseous abnormality.  IMPRESSION: Nonobstructive bowel gas pattern.  Original Report Authenticated By: Waneta Martins, M.D.   Rounding with patient 0432 to inquire on status changes and update on ED process; pt comfortable and has no further c/o.  1. COPD (chronic obstructive pulmonary disease)   2. Abdominal  pain, other specified site   3. Constipation   4. Anal fissure       MDM  Nonspecific respiratory complaints with abdominal pain, and constipation. Vision screening evaluation is negative for evidence of serious bacterial infection, pneumonia, bowel obstruction, metabolic instability.        Flint Melter, MD 02/04/11 [phone]

## 2011-02-05 LAB — URINE CULTURE
Culture  Setup Time: 201302021151
Culture: NO GROWTH

## 2011-02-13 ENCOUNTER — Ambulatory Visit: Payer: Medicare Other | Admitting: Internal Medicine

## 2011-02-23 ENCOUNTER — Ambulatory Visit (INDEPENDENT_AMBULATORY_CARE_PROVIDER_SITE_OTHER): Payer: Medicare Other | Admitting: Internal Medicine

## 2011-02-23 ENCOUNTER — Encounter: Payer: Self-pay | Admitting: Internal Medicine

## 2011-02-23 DIAGNOSIS — J449 Chronic obstructive pulmonary disease, unspecified: Secondary | ICD-10-CM

## 2011-02-23 DIAGNOSIS — M545 Low back pain: Secondary | ICD-10-CM

## 2011-02-23 DIAGNOSIS — M5416 Radiculopathy, lumbar region: Secondary | ICD-10-CM

## 2011-02-23 DIAGNOSIS — M542 Cervicalgia: Secondary | ICD-10-CM

## 2011-02-23 DIAGNOSIS — M5412 Radiculopathy, cervical region: Secondary | ICD-10-CM

## 2011-02-23 DIAGNOSIS — IMO0002 Reserved for concepts with insufficient information to code with codable children: Secondary | ICD-10-CM

## 2011-02-23 MED ORDER — BUDESONIDE-FORMOTEROL FUMARATE 80-4.5 MCG/ACT IN AERO: 2.0000 | INHALATION_SPRAY | Freq: Two times a day (BID) | RESPIRATORY_TRACT | Status: DC

## 2011-02-23 MED ORDER — OXYCODONE-ACETAMINOPHEN 7.5-325 MG PO TABS: 1.0000 | ORAL_TABLET | ORAL | Status: DC | PRN

## 2011-02-23 MED ORDER — PANTOPRAZOLE SODIUM 40 MG PO TBEC: 40.0000 mg | DELAYED_RELEASE_TABLET | Freq: Two times a day (BID) | ORAL | Status: DC

## 2011-02-23 NOTE — Assessment & Plan Note (Signed)
She did not tolerate tudorza due to nausea, I will change her to symbicort and have asked her to see pulmonary

## 2011-02-23 NOTE — Progress Notes (Signed)
Subjective:    Patient ID: [name], female    DOB: 1945/09/01, 66 y.o.   MRN: [id number]  Neck Pain  This is a chronic problem. The current episode started more than 1 month ago. The problem occurs constantly. The problem has been gradually worsening. The pain is associated with nothing. The pain is present in the right side. The quality of the pain is described as shooting and stabbing. The pain is at a severity of 6/10. The pain is moderate. The symptoms are aggravated by position. The pain is same all the time. Stiffness is present all day. Associated symptoms include numbness (and tingling in her right arm and hand) and tingling. Pertinent negatives include no chest pain, fever, headaches, leg pain, pain with swallowing, paresis, photophobia, trouble swallowing, visual change, weakness or weight loss. She has tried oral narcotics for the symptoms. The treatment provided moderate relief.      Review of Systems  Constitutional: Negative for fever, chills, weight loss, diaphoresis, activity change, appetite change, fatigue and unexpected weight change.  HENT: Positive for neck pain and neck stiffness. Negative for facial swelling and trouble swallowing.   Eyes: Negative for photophobia, pain, discharge, redness, itching and visual disturbance.  Respiratory: Positive for shortness of breath and wheezing. Negative for apnea, cough, choking, chest tightness and stridor.   Cardiovascular: Negative for chest pain, palpitations and leg swelling.  Genitourinary: Negative.   Musculoskeletal: Negative for myalgias, back pain, joint swelling, arthralgias and gait problem.  Skin: Negative for color change, pallor, rash and wound.  Neurological: Positive for tingling and numbness (and tingling in her right arm and hand). Negative for dizziness, tremors, seizures, syncope, facial asymmetry, speech difficulty, weakness, light-headedness and headaches.  Hematological: Negative for adenopathy. Does  not bruise/bleed easily.  Psychiatric/Behavioral: Negative.        Objective:   Physical Exam  Vitals reviewed. Constitutional: She is oriented to person, place, and time. She appears well-developed and well-nourished. No distress.  HENT:  Head: Normocephalic and atraumatic.  Mouth/Throat: Oropharynx is clear and moist. No oropharyngeal exudate.  Eyes: Conjunctivae are normal. Right eye exhibits no discharge. Left eye exhibits no discharge. No scleral icterus.  Neck: Normal range of motion. Neck supple. No JVD present. No tracheal deviation present. No thyromegaly present.  Cardiovascular: Normal rate, regular rhythm, normal heart sounds and intact distal pulses.  Exam reveals no gallop and no friction rub.   No murmur heard. Pulmonary/Chest: Breath sounds normal. No stridor. No respiratory distress. She has no wheezes. She has no rales. She exhibits no tenderness.  Abdominal: Soft. Bowel sounds are normal. She exhibits no distension and no mass. There is no tenderness. There is no rebound and no guarding.  Musculoskeletal: Normal range of motion. She exhibits no edema and no tenderness.       Cervical back: Normal. She exhibits normal range of motion, no tenderness, no bony tenderness, no swelling, no edema, no deformity, no laceration, no pain and no spasm.  Lymphadenopathy:    She has no cervical adenopathy.  Neurological: She is alert and oriented to person, place, and time. She has normal strength. She displays no atrophy, no tremor and normal reflexes. No cranial nerve deficit or sensory deficit. She exhibits normal muscle tone. She displays a negative Romberg sign. She displays no seizure activity. Gait abnormal. Coordination normal.  Reflex Scores:      Tricep reflexes are 1+ on the right side and 1+ on the left side.      Bicep  reflexes are 1+ on the right side and 1+ on the left side.      Brachioradialis reflexes are 1+ on the right side and 1+ on the left side.      Patellar  reflexes are 1+ on the right side.      Achilles reflexes are 1+ on the right side and 1+ on the left side. Skin: Skin is warm and dry. No rash noted. She is not diaphoretic. No erythema. No pallor.  Psychiatric: She has a normal mood and affect. Her behavior is normal. Judgment and thought content normal.      Lab Results  Component Value Date   WBC 7.4 02/04/2011   HGB 10.4* 02/04/2011   HCT 31.9* 02/04/2011   PLT 298 02/04/2011   GLUCOSE 94 02/04/2011   CHOL 152 09/18/2010   TRIG 71 09/18/2010   HDL 60 09/18/2010   LDLCALC 78 09/18/2010   ALT 17 12/15/2010   AST 15 12/15/2010   NA 133* 02/04/2011   K 3.6 02/04/2011   CL 96 02/04/2011   CREATININE 1.14* 02/04/2011   BUN 8 02/04/2011   CO2 28 02/04/2011   TSH 2.50 12/15/2010   INR 1.11 09/18/2010   HGBA1C 8.6* 12/15/2010   MICROALBUR 2.45* 03/15/2007      Assessment & Plan:

## 2011-02-23 NOTE — Assessment & Plan Note (Signed)
Continue percocet for now

## 2011-02-23 NOTE — Assessment & Plan Note (Signed)
She has worsening pain and related symptoms so I have ordered an MRI to see if the large spurs are pressing on nerves or spinal cord, she was referred for pain management and will continue percocet as needed

## 2011-02-23 NOTE — Patient Instructions (Signed)

## 2011-03-02 ENCOUNTER — Ambulatory Visit
Admission: RE | Admit: 2011-03-02 | Discharge: 2011-03-02 | Disposition: A | Discharge status: Home / Self Care | Payer: Medicare Other | Source: Ambulatory Visit | Attending: Internal Medicine | Admitting: Internal Medicine

## 2011-03-02 DIAGNOSIS — M5412 Radiculopathy, cervical region: Secondary | ICD-10-CM

## 2011-03-03 ENCOUNTER — Encounter: Payer: Self-pay | Admitting: Internal Medicine

## 2011-03-09 ENCOUNTER — Encounter: Payer: Self-pay | Admitting: Internal Medicine

## 2011-03-09 ENCOUNTER — Telehealth: Payer: Self-pay | Admitting: *Deleted

## 2011-03-09 ENCOUNTER — Ambulatory Visit (INDEPENDENT_AMBULATORY_CARE_PROVIDER_SITE_OTHER): Payer: Medicare Other | Admitting: Internal Medicine

## 2011-03-09 VITALS — BP 140/82 | HR 78 | Temp 98.0°F | Ht 69.0 in | Wt 225.2 lb

## 2011-03-09 DIAGNOSIS — J449 Chronic obstructive pulmonary disease, unspecified: Secondary | ICD-10-CM

## 2011-03-09 DIAGNOSIS — J4489 Other specified chronic obstructive pulmonary disease: Secondary | ICD-10-CM

## 2011-03-09 DIAGNOSIS — R918 Other nonspecific abnormal finding of lung field: Secondary | ICD-10-CM

## 2011-03-09 DIAGNOSIS — R9389 Abnormal findings on diagnostic imaging of other specified body structures: Secondary | ICD-10-CM

## 2011-03-09 DIAGNOSIS — R0989 Other specified symptoms and signs involving the circulatory and respiratory systems: Secondary | ICD-10-CM

## 2011-03-09 DIAGNOSIS — R0600 Dyspnea, unspecified: Secondary | ICD-10-CM

## 2011-03-09 DIAGNOSIS — R0609 Other forms of dyspnea: Secondary | ICD-10-CM

## 2011-03-09 MED ORDER — MOMETASONE FURO-FORMOTEROL FUM 200-5 MCG/ACT IN AERO: INHALATION_SPRAY | RESPIRATORY_TRACT | Status: DC

## 2011-03-09 NOTE — Telephone Encounter (Signed)
Checking status on wrist brace for pt. Please complete and fax back. If haven't received or have questions call number above.

## 2011-03-09 NOTE — Progress Notes (Signed)
  Subjective:    Patient ID: [name], female    DOB: [birth year]   MRN: [id number]  HPI  32 yobf quit smoking in 2010 at onset of sob and worse since assoc with wt gain of 25 lbs referred 03/09/2011 to pulmonary clinic by Dr [name]  03/09/2011 1st pulmonary eval cc doe progressive x 2 years to point just walking across the house or shower gets out of breath, variable severity, assoc with lots of sinus and throat congestion but no purulent sputum and dysphagia but denies choking on food. Some better p saba daytime  Sleeping ok without nocturnal  or early am exacerbation  of respiratory  c/o's or need for noct saba. Also denies any obvious fluctuation of symptoms with weather or environmental changes or other aggravating or alleviating factors except as outlined above   Review of Systems  Constitutional: Positive for unexpected weight change. Negative for fever.  HENT: Positive for congestion, sore throat, rhinorrhea, trouble swallowing, dental problem, postnasal drip and sinus pressure. Negative for ear pain, nosebleeds and sneezing.   Eyes: Negative for redness and itching.  Respiratory: Positive for cough, shortness of breath and wheezing. Negative for chest tightness.   Cardiovascular: Positive for leg swelling. Negative for palpitations.  Gastrointestinal: Positive for nausea. Negative for vomiting.  Genitourinary: Positive for difficulty urinating. Negative for dysuria.  Musculoskeletal: Negative for joint swelling.  Skin: Negative for rash.  Neurological: Positive for headaches.  Hematological: Does not bruise/bleed easily.  Psychiatric/Behavioral: Negative for dysphoric mood. The patient is nervous/anxious.        Objective:   Physical Exam  Obese bf nad  Wt  225 03/09/2011  HEENT mild turbinate edema.  Oropharynx no thrush or excess pnd or cobblestoning.  No JVD or cervical adenopathy. Mild accessory muscle hypertrophy. Trachea midline, nl thryroid. Chest was min  hyperinflated by percussion with diminished breath sounds and mild increased exp time without wheeze. Hoover sign positive at end inspiration. Regular rate and rhythm without murmur gallop or rub or increase P2 or edema.  Abd: no hsm, nl excursion. Ext warm without cyanosis or clubbing.     02/04/11 Cardiomegaly with central vascular congestion.  Mild right lung base opacity; atelectasis versus infiltrate.     Assessment & Plan:

## 2011-03-09 NOTE — Patient Instructions (Signed)
Protonix 40 mg Take 30- 60 min before your first and last meals of the day   GERD (REFLUX)  is an extremely common cause of respiratory symptoms, many times with no significant heartburn at all.    It can be treated with medication, but also with lifestyle changes including avoidance of late meals, excessive alcohol, smoking cessation, and avoid fatty foods, chocolate, peppermint, colas, red wine, and acidic juices such as orange juice.  NO MINT OR MENTHOL PRODUCTS SO NO COUGH DROPS  USE SUGARLESS CANDY INSTEAD (jolley ranchers or Stover's)  NO OIL BASED VITAMINS - use powdered substitutes.    Stop symbicort  Dulera 200 Take 2 puffs first thing in am and then another 2 puffs about 12 hours later.   Work on inhaler technique:  relax and gently blow all the way out then take a nice smooth deep breath back in, triggering the inhaler at same time you start breathing in.  Hold for up to 5 seconds if you can.  Rinse and gargle with water when done   If your mouth or throat starts to bother you,   I suggest you time the inhaler to your dental care and after using the inhaler(s) brush teeth and tongue with a baking soda containing toothpaste and when you rinse this out, gargle with it first to see if this helps your mouth and throat.     Follow up with Dr Craige Cotta in 2 weeks

## 2011-03-10 ENCOUNTER — Encounter: Payer: Self-pay | Admitting: Physical Medicine and Rehabilitation

## 2011-03-12 DIAGNOSIS — R9389 Abnormal findings on diagnostic imaging of other specified body structures: Secondary | ICD-10-CM | POA: Insufficient documentation

## 2011-03-12 NOTE — Assessment & Plan Note (Signed)
Needs f/u by Dr Craige Cotta to regroup re osa and copd rx > placed reminder in tickle file to happen by 04/03/11

## 2011-03-12 NOTE — Assessment & Plan Note (Addendum)
Spirometry is wnl and Symptoms are markedly disproportionate to objective findings and not clear this is even a lung problem but pt does appear to have difficult airway management issues.   DDX of  difficult airways managment all start with A and  include Adherence, Ace Inhibitors, Acid Reflux, Active Sinus Disease, Alpha 1 Antitripsin deficiency, Anxiety masquerading as Airways dz,  ABPA,  allergy(esp in young), Aspiration (esp in elderly), Adverse effects of DPI,  Active smokers, plus two Bs  = Bronchiectasis and Beta blocker use..and one C= CHF  Adherence is always the initial "prime suspect" and is a multilayered concern that requires a "trust but verify" approach in every patient - starting with knowing how to use medications, especially inhalers, correctly, keeping up with refills and understanding the fundamental difference between maintenance and prns vs those medications only taken for a very short course and then stopped and not refilled. The proper method of use, as well as anticipated side effects, of this metered-dose inhaler are discussed and demonstrated to the patient. Only approached 50% effectiveness with coaching. May need to consider alternatives like advair or nebulized perforomist/budesonide if not improving  ? Acid reflux > reviewed rx and diet  ? acei > supposed to be off them now but she's a little shaky on meds and would definitely use a trust but verify approach here  ? Beta blocker effect > consider trial of bisoprolol or bystolic  over coreg

## 2011-03-20 NOTE — Telephone Encounter (Signed)
Patient was given written rx at last appt/ closing phone note due to duplicate request

## 2011-03-27 ENCOUNTER — Other Ambulatory Visit: Payer: Self-pay

## 2011-03-27 DIAGNOSIS — E119 Type 2 diabetes mellitus without complications: Secondary | ICD-10-CM

## 2011-03-27 DIAGNOSIS — I10 Essential (primary) hypertension: Secondary | ICD-10-CM

## 2011-03-27 DIAGNOSIS — I509 Heart failure, unspecified: Secondary | ICD-10-CM

## 2011-03-27 MED ORDER — OLMESARTAN MEDOXOMIL 40 MG PO TABS: 40.0000 mg | ORAL_TABLET | Freq: Every day | ORAL | Status: DC

## 2011-03-29 ENCOUNTER — Encounter
Payer: Medicare Other | Attending: Physical Medicine and Rehabilitation | Admitting: Physical Medicine and Rehabilitation

## 2011-03-29 ENCOUNTER — Encounter: Payer: Self-pay | Admitting: Physical Medicine and Rehabilitation

## 2011-03-29 VITALS — BP 187/94 | HR 82 | Resp 16 | Ht 69.5 in | Wt 219.0 lb

## 2011-03-29 DIAGNOSIS — R29818 Other symptoms and signs involving the nervous system: Secondary | ICD-10-CM

## 2011-03-29 DIAGNOSIS — E669 Obesity, unspecified: Secondary | ICD-10-CM | POA: Insufficient documentation

## 2011-03-29 DIAGNOSIS — M79604 Pain in right leg: Secondary | ICD-10-CM

## 2011-03-29 DIAGNOSIS — M25559 Pain in unspecified hip: Secondary | ICD-10-CM

## 2011-03-29 DIAGNOSIS — M79609 Pain in unspecified limb: Secondary | ICD-10-CM | POA: Insufficient documentation

## 2011-03-29 DIAGNOSIS — M25551 Pain in right hip: Secondary | ICD-10-CM

## 2011-03-29 DIAGNOSIS — M545 Low back pain, unspecified: Secondary | ICD-10-CM | POA: Insufficient documentation

## 2011-03-29 DIAGNOSIS — M47812 Spondylosis without myelopathy or radiculopathy, cervical region: Secondary | ICD-10-CM | POA: Insufficient documentation

## 2011-03-29 DIAGNOSIS — IMO0002 Reserved for concepts with insufficient information to code with codable children: Secondary | ICD-10-CM

## 2011-03-29 DIAGNOSIS — M79601 Pain in right arm: Secondary | ICD-10-CM

## 2011-03-29 DIAGNOSIS — M542 Cervicalgia: Secondary | ICD-10-CM

## 2011-03-29 DIAGNOSIS — M5030 Other cervical disc degeneration, unspecified cervical region: Secondary | ICD-10-CM | POA: Insufficient documentation

## 2011-03-29 NOTE — Patient Instructions (Signed)
I am sending you for a right hip x-ray area  I am sending you to physical therapy to work on your balance as well as pain management techniques for your neck and back  I've given YOU a prescription for a neck brace which she can wear on a when necessary basis. I'm also giving U. wrist splint she can wear at night for your right wrist.  I will see her back in a month to review your x-rays of her right hip. Your also being set up for electrodiagnostic studies to evaluate your right hand and arm numbness further.

## 2011-03-29 NOTE — Progress Notes (Signed)
Subjective:    Patient ID: [name], female    DOB: 05/12/1945, 66 y.o.   MRN: [id number]  HPI  The patient is a 66 year old African American woman who has been referred by Dr. [name].  She reports her chief complaint is neck and right upper extremity pain.  She also reports some low back pain with occasional radiation to her right leg.  She reports that her neck pain came on gradually about 6 months ago possibly a little longer. She denies any history of falls or accidents or trauma. She really cannot think of anything that brought this on. Pain radiates from her neck to her right hand. She reports this pain is constant  Her neck pain is worse with movement  She tells me her pain varies and some days her low back is worse than her neck, however today her neck is the worst problem addition to the arm.  She reports about a 6 month history of right hand tingling as well. She reports a history of Right  carpal tunnel surgery approximately 12 years ago.   She tells me she's been taking oxycodone about twice a day for the last 4 months.   Average Pain 7 Pain Right Now 5 My pain is intermittent and tingling  In the last 24 hours, has pain interfered with the following? General activity 6 Relation with others 6 Enjoyment of life 6 What TIME of day is your pain at its worst? morning Sleep (in general) Fair  Pain is worse with: unsure Pain improves with: medication Relief from Meds: 8  Mobility walk without assistance how many minutes can you walk? 10 ability to climb steps?  no do you drive?  no Do you have any goals in this area?  yes  Function retired I need assistance with the following:  meal prep and household duties Do you have any goals in this area?  yes  Neuro/Psych bladder control problems No problems in this area weakness numbness tingling trouble walking dizziness depression anxiety  Prior Studies x-rays CT/MRI  Physicians involved  in your care Primary care  Psychiatrist   Review of Systems  Constitutional: Positive for diaphoresis.  HENT: Negative.   Eyes: Negative.   Respiratory: Positive for apnea, cough, shortness of breath and wheezing.   Cardiovascular: Negative.   Gastrointestinal: Positive for nausea, vomiting, abdominal pain and constipation.  Genitourinary: Negative.   Musculoskeletal: Negative.   Skin: Negative.   Neurological: Positive for dizziness, weakness and numbness.  Hematological: Negative.   Psychiatric/Behavioral: Negative.        Objective:   Physical Exam   Patient is a well-developed well-nourished mildly obese woman who appears her stated age and does not appear in any distress she is oriented x3 speech is clear affect is bright she's alert cooperative and pleasant , answers the question appropriately cranial nerves and coordination are intact.  Reflexes are diminished in both upper and lower extremities there's no abnormal tone clonus or tremors  Hoffman Sign is negative  She has good strength in both upper and lower extremities without obvious focal deficit  She's decreased sensation and median distribution on the right  She has  decreased sensation below the knees bilaterally, decreased vibratory senses noted as well  She transitions relatively easily from sitting to standing gait is stable none antalgic  Difficulty as noted with tandem gait as well as Romberg test.   well preserved range of motion is noted in her neck and shoulders.  Forward  flexion of the lumbar spine does not aggravate pain however extension does exacerbate  Tenderness is noted over the right sacroiliac joint  Internal and external rotation of the right hip also aggravate posterior hip pain     Assessment & Plan:   1. Cervicalgia  MRI cervical 03/02/11  1. Cervical spondylosis and degenerative disc disease results and  very degrees of foraminal impingement at all levels between C2 and  C7.    2. Despite efforts by the patient and technologist, motion  artifact is present on some series of today's examination and could  not be totally eliminated. This reduces diagnostic sensitivity and  Specificity.  2. Right arm pain may be related to cervical spondylosis/stenosis. Cannot rule out recurrence of carpal tunnel in the right wrist.  3.Low back pain 09/12/10 lumbar xray result  Significant narrowing at L4-5 is  stable. Mild narrowing at L3-4. No vertebral body height loss.  No pars defect. Extensive vascular calcifications are noted.  Degenerative changes of the SI joints are moderate.   4. bilateral lower extremity numbness with 20 year history of diabetes mellitus.  #5. Posterior hip pain with internal and external rotation at the hip-her right hip radiograph   5. Balance disorder most likely related to above  Consider neck brace.  Wrist splint.  Set up for carpal tunnel evaluation with electrodiagnostic studies.  Consider physical / occupational therapy. Education on joint protection techniques as well as pacing. Pain management techniques as well as core stabilization,balance program.  May consider gabapentin for extremity pain however patient is reluctant to start another medicine for pain. I agree with this as well. She is hopeful that the splinting and therapy will be I'll see needs. At this time she is also trying to minimize the amount of narcotic she is using she understands he can contribute to falls in the elderly.   Need to check urine drug screen prior to narcotic prescription.

## 2011-03-30 ENCOUNTER — Ambulatory Visit (INDEPENDENT_AMBULATORY_CARE_PROVIDER_SITE_OTHER)
Admission: RE | Admit: 2011-03-30 | Discharge: 2011-03-30 | Disposition: A | Discharge status: Home / Self Care | Payer: Medicare Other | Source: Ambulatory Visit | Attending: Pulmonary Disease | Admitting: Pulmonary Disease

## 2011-03-30 ENCOUNTER — Ambulatory Visit (INDEPENDENT_AMBULATORY_CARE_PROVIDER_SITE_OTHER): Payer: Medicare Other | Admitting: Pulmonary Disease

## 2011-03-30 ENCOUNTER — Encounter: Payer: Self-pay | Admitting: Pulmonary Disease

## 2011-03-30 ENCOUNTER — Encounter: Payer: Self-pay | Admitting: Physical Medicine and Rehabilitation

## 2011-03-30 DIAGNOSIS — R918 Other nonspecific abnormal finding of lung field: Secondary | ICD-10-CM

## 2011-03-30 DIAGNOSIS — R9389 Abnormal findings on diagnostic imaging of other specified body structures: Secondary | ICD-10-CM

## 2011-03-30 DIAGNOSIS — J45909 Unspecified asthma, uncomplicated: Secondary | ICD-10-CM

## 2011-03-30 DIAGNOSIS — G4733 Obstructive sleep apnea (adult) (pediatric): Secondary | ICD-10-CM

## 2011-03-30 MED ORDER — MOMETASONE FURO-FORMOTEROL FUM 100-5 MCG/ACT IN AERO: 2.0000 | INHALATION_SPRAY | Freq: Two times a day (BID) | RESPIRATORY_TRACT | Status: DC

## 2011-03-30 MED ORDER — LEVALBUTEROL TARTRATE 45 MCG/ACT IN AERO: 1.0000 | INHALATION_SPRAY | RESPIRATORY_TRACT | Status: DC | PRN

## 2011-03-30 NOTE — Assessment & Plan Note (Signed)
She has right lower lung ATX/ASD on CXR from February.  Will repeat chest xray today and call her with results.

## 2011-03-30 NOTE — Assessment & Plan Note (Signed)
She is compliant with CPAP and reports benefit.  Will get her download and call her with results.

## 2011-03-30 NOTE — Assessment & Plan Note (Signed)
She has prior history of smoking, but no evidence for obstruction on spirometry.  She has improvement in symptoms from dulera.  Will continue dulera two puffs bid, and use prn xopenex in between.

## 2011-03-30 NOTE — Progress Notes (Signed)
Chief Complaint  Patient presents with  . Follow-up    Pt states her breathing is getting better since being on the dulera. Still has wheezing and chest tightness w. exertion, and is having some runny nose. denies any cough    History of Present Illness: [name] is a 66 y.o. female former smoker Chronic bronchitis/asthma, and OSA.  I last saw her in January 2011.  She was seen by Dr. Sherene Sires earlier this month.  She was treated for a pneumonia in February.  She had normal spirometry at last visit with Dr. Sherene Sires.  She still had cough, wheeze, chest congestion, and sputum.  She was started on dulera and this helped.  She was noted to have Rt lower ASD/ATX on CXR from February.  She has not had repeat CXR since.  She denies fever, sweats, sinus congestion, sore throat, or gland swelling.  She still gets winded with activity, especially in cold/dry air.  She has been using her CPAP most nights, and this helps.  She is not having any trouble with her mask.  She does snore if she forgets to use her CPAP.  She thinks her DME is [facility].   Past Medical History  Diagnosis Date  . DM 02/21/2006  . DYSLIPIDEMIA 04/23/2008  . OBESITY NOS 04/25/2006  . DEPRESSION, RECURRENT, IN PARTIAL REMISSION 03/08/2006  . HYPERTENSION 11/07/2005  . CORONARY ARTERY DISEASE 07/09/2000  . CONGESTIVE HEART FAILURE 12/11/2005  . GERD 11/07/2005  . DIVERTICULOSIS, COLON, WITH HEMORRHAGE 03/17/2001  . BACK PAIN, LUMBAR, WITH RADICULOPATHY 03/07/2006  . ABNORMAL ELECTROCARDIOGRAM 06/16/2009  . OBSTRUCTIVE SLEEP APNEA 01/10/2010  . Asthma     Past Surgical History  Procedure Date  . Uvuloplasty   . Total abdominal hysterectomy w/ bilateral salpingoophorectomy   . Abdominal hysterectomy   . Cholecystectomy     Allergies  Allergen Reactions  . Amlodipine     constipation  . Ramipril Cough  . Adhesive (Tape)   . Codeine   . Penicillins     Physical Exam:  Blood pressure 130/86, pulse 80,  temperature 98.1 F (36.7 C), temperature source Oral, height 5' 9.5" (1.765 m), weight 223 lb (101.152 kg), SpO2 98.00%.  Body mass index is 32.46 kg/(m^2).  Wt Readings from Last 2 Encounters:  03/30/11 223 lb (101.152 kg)  03/29/11 219 lb (99.338 kg)    General - Obese  HEENT - no sinus tenderness, no oral exudate, no LAN Cardiac - s1s2 regular, no murmur Chest - decreased breath sounds, no wheeze/rales/dullness Abdomen - soft, non-tender Extremities - no edema Skin - no rashes Neurologic - normal strength Psychiatric - normal mood, behavior   CHEST - 2 VIEW  02/04/11: Comparison: 12/15/2010  Findings: Cardiomegaly. Central vascular congestion. Mild right lung base opacity. No pleural effusion or pneumothorax.  Multilevel degenerative changes. No acute osseous abnormality.  IMPRESSION:  Cardiomegaly with central vascular congestion.  Mild right lung base opacity; atelectasis versus infiltrate.  Original Report Authenticated By: Waneta Martins, M.D.    Assessment/Plan:  Outpatient Encounter Prescriptions as of 03/30/2011  Medication Sig Dispense Refill  . aspirin 81 MG tablet Take 81 mg by mouth daily.        [name] atorvastatin (LIPITOR) 40 MG tablet Take 1 tablet (40 mg total) by mouth daily.  90 tablet  3  . B-D ULTRAFINE III SHORT PEN 31G X 8 MM MISC USE TWICE DAILY FOR INSULIN INJECTION  180 each  3  . bisacodyl (DULCOLAX) 5 MG  EC tablet Take 5 mg by mouth daily as needed.        . carvedilol (COREG) 25 MG tablet Take 1.5 tablets (37.5 mg total) by mouth 2 (two) times daily with a meal. Take one and one half tablet twice daily. PT WANTS BRAND ONLY   90 tablet  6  . Colesevelam HCl 3.75 G PACK Take 1 each by mouth daily.  30 each  11  . furosemide (LASIX) 80 MG tablet Take 1 tablet (80 mg total) by mouth daily.  90 tablet  3  . insulin aspart (NOVOLOG) 100 UNIT/ML injection Inject into the skin. 15 units at breakfast 10units at lunch 10 units at dinner      . insulin  glargine (LANTUS) 100 UNIT/ML injection Inject 40 Units into the skin at bedtime.       [name] olmesartan (BENICAR) 40 MG tablet Take 1 tablet (40 mg total) by mouth daily.  30 tablet  3  . oxyCODONE-acetaminophen (PERCOCET) 7.5-325 MG per tablet Take 1 tablet by mouth every 4 (four) hours as needed for pain.  100 tablet  0  . pantoprazole (PROTONIX) 40 MG tablet Take 1 tablet (40 mg total) by mouth 2 (two) times daily. Name Brand only  60 tablet  11  . Polyethylene Glycol 3350 (MIRALAX PO) Take by mouth. As directed        . potassium chloride (MICRO-K) 10 MEQ CR capsule Take 10 mEq by mouth daily.        . SitaGLIPtin-MetFORMIN HCl 50-1000 MG TB24 Take 1 tablet by mouth daily.  112 tablet  0  . venlafaxine (EFFEXOR XR) 150 MG 24 hr capsule Take 150 mg by mouth 2 (two) times daily.        [name] DISCONTD: Mometasone Furo-Formoterol Fum (DULERA) 200-5 MCG/ACT AERO Take 2 puffs first thing in am and then another 2 puffs about 12 hours later.      . levalbuterol (XOPENEX HFA) 45 MCG/ACT inhaler Inhale 1-2 puffs into the lungs every 4 (four) hours as needed for wheezing.  1 Inhaler  12  . mometasone-formoterol (DULERA) 100-5 MCG/ACT AERO Inhale 2 puffs into the lungs 2 (two) times daily.  1 Inhaler  5  . DISCONTD: albuterol (VENTOLIN HFA) 108 (90 BASE) MCG/ACT inhaler Inhale 2 puffs into the lungs every 6 (six) hours as needed.        [name] DISCONTD: Linaclotide 290 MCG CAPS Take 1 tablet by mouth daily.  60 capsule  0    Diane Mochizuki Pager:  [phone] 03/30/2011, 11:37 AM

## 2011-03-30 NOTE — Patient Instructions (Signed)
Dulera two puffs twice per day, and rinse mouth after using Xopenex two puffs as needed for cough, wheeze, or chest congestion Chest xray today>>will call with results Will get report from CPAP machine and call with results Follow up in 6 months

## 2011-04-02 ENCOUNTER — Telehealth: Payer: Self-pay | Admitting: Pulmonary Disease

## 2011-04-02 NOTE — Telephone Encounter (Signed)
Dg Chest 2 View  03/30/2011  *RADIOLOGY REPORT*  Clinical Data: Shortness of breath.  CHEST - 2 VIEW  Comparison: Plain films of the chest 02/04/2011, 08/23/2010 and 08/21/2007.  CT chest 08/04/2005.  Findings: There is cardiomegaly but no pulmonary edema.  Mild right basilar airspace opacity seen on the comparison study is no longer identified.  No pneumothorax or pleural fluid.  IMPRESSION: Cardiomegaly without acute disease.  Original Report Authenticated By: Bernadene Bell. Maricela Curet, M.D.    Will have my nurse inform patient that chest xray looks much better.  Everything has cleared up from CXR in February.  No change to current plan.

## 2011-04-03 ENCOUNTER — Encounter: Payer: Self-pay | Admitting: Physical Medicine and Rehabilitation

## 2011-04-04 ENCOUNTER — Telehealth: Payer: Self-pay | Admitting: *Deleted

## 2011-04-04 NOTE — Telephone Encounter (Signed)
UDS is consistent. Pt had been out of Oxycodone prior to test being done.

## 2011-04-04 NOTE — Telephone Encounter (Signed)
LM to call back. UDS is inconsistent.

## 2011-04-04 NOTE — Telephone Encounter (Signed)
I spoke with patient about results and she verbalized understanding and had no questions 

## 2011-04-10 ENCOUNTER — Ambulatory Visit (HOSPITAL_COMMUNITY)
Admission: RE | Admit: 2011-04-10 | Discharge: 2011-04-10 | Disposition: A | Discharge status: Home / Self Care | Payer: Medicare Other | Source: Ambulatory Visit | Attending: Physical Medicine and Rehabilitation | Admitting: Physical Medicine and Rehabilitation

## 2011-04-10 DIAGNOSIS — M25559 Pain in unspecified hip: Secondary | ICD-10-CM | POA: Insufficient documentation

## 2011-04-11 ENCOUNTER — Ambulatory Visit
Payer: Medicare Other | Attending: Physical Medicine and Rehabilitation | Admitting: Rehabilitative and Restorative Service Providers"

## 2011-04-11 DIAGNOSIS — IMO0001 Reserved for inherently not codable concepts without codable children: Secondary | ICD-10-CM | POA: Insufficient documentation

## 2011-04-11 DIAGNOSIS — M542 Cervicalgia: Secondary | ICD-10-CM | POA: Insufficient documentation

## 2011-04-11 DIAGNOSIS — M545 Low back pain, unspecified: Secondary | ICD-10-CM | POA: Insufficient documentation

## 2011-04-11 DIAGNOSIS — R5381 Other malaise: Secondary | ICD-10-CM | POA: Insufficient documentation

## 2011-04-11 DIAGNOSIS — R293 Abnormal posture: Secondary | ICD-10-CM | POA: Insufficient documentation

## 2011-04-12 ENCOUNTER — Telehealth: Payer: Self-pay | Admitting: Pulmonary Disease

## 2011-04-12 NOTE — Telephone Encounter (Signed)
I spoke with patient about results and she verbalized understanding and had no questions 

## 2011-04-12 NOTE — Telephone Encounter (Signed)
CPAP 02/18/11 to 03/29/11>>Used on 17 of 30 nights with median 4 hrs 19 min.  Will have my nurse inform patient that CPAP report looked okay, but she needs to use machine for entire time asleep to get maximal benefit.

## 2011-04-17 ENCOUNTER — Ambulatory Visit: Payer: Medicare Other | Admitting: Cardiology

## 2011-04-17 ENCOUNTER — Encounter (INDEPENDENT_AMBULATORY_CARE_PROVIDER_SITE_OTHER): Payer: Medicare Other

## 2011-04-17 ENCOUNTER — Ambulatory Visit (INDEPENDENT_AMBULATORY_CARE_PROVIDER_SITE_OTHER): Payer: Medicare Other | Admitting: Internal Medicine

## 2011-04-17 ENCOUNTER — Other Ambulatory Visit: Payer: Self-pay | Admitting: Cardiology

## 2011-04-17 ENCOUNTER — Encounter: Payer: Self-pay | Admitting: Internal Medicine

## 2011-04-17 ENCOUNTER — Other Ambulatory Visit: Payer: Self-pay | Admitting: Internal Medicine

## 2011-04-17 ENCOUNTER — Ambulatory Visit: Payer: Medicare Other | Admitting: Rehabilitative and Restorative Service Providers"

## 2011-04-17 VITALS — BP 158/90 | HR 77 | Temp 98.2°F | Resp 16 | Ht 70.0 in | Wt 218.0 lb

## 2011-04-17 DIAGNOSIS — M7989 Other specified soft tissue disorders: Secondary | ICD-10-CM

## 2011-04-17 NOTE — Patient Instructions (Signed)
It was good to see you today. Will refer you for an ultrasound to exclude a blood clot - we will call you with results after review If there is no blood clot, will refer to orthopedics for further evaluation of this swelling and pain Use ice and tylenol every 6 hours as needed for discomfort, call if symptoms worse or unimporved

## 2011-04-17 NOTE — Progress Notes (Signed)
  Subjective:    Patient ID: [name], female    DOB: [birth year], 66 y.o.   MRN: [id number]  HPI Complains of swelling of her inner left elbow Noted 24 hours ago while at church Describes pop-type sensation with swelling over medial olecranon region Denies activity or trauma Associated with mild pain, symptoms most comfortable with arm extended At physical therapy today per routine schedule for arthritis - told by therapist she may have blood clot and referred here for urgent evaluation of same No prior history of blood clot known No weakness or numbness in left hand  Past Medical History  Diagnosis Date  . DM   . DYSLIPIDEMIA   . OBESITY NOS   . DEPRESSION, RECURRENT, IN PARTIAL REMISSION   . HYPERTENSION   . CORONARY ARTERY DISEASE   . CONGESTIVE HEART FAILURE   . GERD   . DIVERTICULOSIS, COLON, WITH HEMORRHAGE   . BACK PAIN, LUMBAR, WITH RADICULOPATHY   . ABNORMAL ELECTROCARDIOGRAM   . OBSTRUCTIVE SLEEP APNEA   . Asthma      Review of Systems  Constitutional: Negative for fever, fatigue and unexpected weight change.  Respiratory: Negative for shortness of breath and wheezing.   Cardiovascular: Negative for chest pain and leg swelling.  Musculoskeletal: Negative for myalgias and back pain.       Objective:   Physical Exam BP 158/90  Pulse 77  Temp(Src) 98.2 F (36.8 C) (Oral)  Resp 16  Ht 5\' 10"  (1.778 m)  Wt 218 lb (98.884 kg)  BMI 31.28 kg/m2  SpO2 97% Gen: NAD MSkel - 2.5" round cystic-like firm swelling proximal, medial side at flexor surface of elbow - nontender - FROM elbow, shoulder and wrist including full flexion, extension, pronation and supination - olecranon and epicondyle regions nontender without swelling - neurovascular intact distally      Assessment & Plan:  Left flexor surface of proximal elbow with subacute swelling and discomfort - check Doppler to exclude blood clot as per request. If negative for clot, referred to orthopedics for  evaluation of possible musculoskeletal problem

## 2011-04-18 ENCOUNTER — Telehealth: Payer: Self-pay | Admitting: Pulmonary Disease

## 2011-04-18 NOTE — Telephone Encounter (Signed)
I called # listed and was transferred to glenda's VM and lmtcb x1. I have not seen any form on pt

## 2011-04-18 NOTE — Telephone Encounter (Signed)
Received copies from [employer] ,on 04/18/11 . Forwarded 3 pages to Dr. Rosezella Rumpf review.

## 2011-04-19 ENCOUNTER — Ambulatory Visit: Payer: Medicare Other | Admitting: Rehabilitative and Restorative Service Providers"

## 2011-04-19 NOTE — Telephone Encounter (Signed)
Spoke with Kathie Rhodes and they will refax the forms to triage fax for Dr. Craige Cotta to review and sign. Will forward msg to Mindy so she can watch for the forms.

## 2011-04-19 NOTE — Telephone Encounter (Signed)
confirmed with pt, she did request from [facility] for cpap and supplies. cmn put in Dr Craige Cotta blue folder for review and signature .Kandice Hams

## 2011-04-19 NOTE — Telephone Encounter (Signed)
Have received fax and this is a CMN and was given to alida. Please advise thanks

## 2011-04-20 ENCOUNTER — Ambulatory Visit (INDEPENDENT_AMBULATORY_CARE_PROVIDER_SITE_OTHER): Payer: Medicare Other | Admitting: Cardiology

## 2011-04-20 ENCOUNTER — Ambulatory Visit: Payer: Medicare Other | Admitting: Rehabilitation

## 2011-04-20 ENCOUNTER — Telehealth: Payer: Self-pay | Admitting: Internal Medicine

## 2011-04-20 ENCOUNTER — Encounter: Payer: Self-pay | Admitting: Cardiology

## 2011-04-20 VITALS — BP 155/85 | HR 64 | Ht 70.0 in | Wt 218.1 lb

## 2011-04-20 DIAGNOSIS — E785 Hyperlipidemia, unspecified: Secondary | ICD-10-CM

## 2011-04-20 DIAGNOSIS — I10 Essential (primary) hypertension: Secondary | ICD-10-CM

## 2011-04-20 DIAGNOSIS — M7989 Other specified soft tissue disorders: Secondary | ICD-10-CM

## 2011-04-20 DIAGNOSIS — I2510 Atherosclerotic heart disease of native coronary artery without angina pectoris: Secondary | ICD-10-CM

## 2011-04-20 DIAGNOSIS — R229 Localized swelling, mass and lump, unspecified: Secondary | ICD-10-CM

## 2011-04-20 DIAGNOSIS — IMO0002 Reserved for concepts with insufficient information to code with codable children: Secondary | ICD-10-CM | POA: Insufficient documentation

## 2011-04-20 DIAGNOSIS — I5022 Chronic systolic (congestive) heart failure: Secondary | ICD-10-CM

## 2011-04-20 DIAGNOSIS — E119 Type 2 diabetes mellitus without complications: Secondary | ICD-10-CM

## 2011-04-20 MED ORDER — FUROSEMIDE 80 MG PO TABS: 80.0000 mg | ORAL_TABLET | Freq: Every day | ORAL | Status: DC

## 2011-04-20 MED ORDER — PANTOPRAZOLE SODIUM 40 MG PO TBEC: 40.0000 mg | DELAYED_RELEASE_TABLET | Freq: Every day | ORAL | Status: DC

## 2011-04-20 MED ORDER — CARVEDILOL 25 MG PO TABS: 37.5000 mg | ORAL_TABLET | Freq: Two times a day (BID) | ORAL | Status: DC

## 2011-04-20 NOTE — Telephone Encounter (Signed)
Notified pt with md response... 04/20/11@1 :06pm/LMB

## 2011-04-20 NOTE — Progress Notes (Signed)
HPI The patient returns for followup of hypertension.  She also has nonobstructive CAD.  Since I last saw her she has had no new cardiovascular complaints. She has a soft tissue swelling in the medial aspect of her left antecubital fossa. She says this is new. She describes some varicose veins or "blueness" of her left forearm when she strains to have a bowel movement. A venous Doppler ordered by Dr. Felicity Coyer demonstrated no evidence of venous thrombosis. She's had no arm swelling. She has no cardiovascular complaints currently.  The patient denies any new symptoms such as chest discomfort, neck or arm discomfort. There has been no new shortness of breath, PND or orthopnea. There have been no reported palpitations, presyncope or syncope.    Allergies  Allergen Reactions  . Amlodipine     constipation  . Ramipril Cough  . Adhesive (Tape)   . Codeine   . Penicillins     Current Outpatient Prescriptions  Medication Sig Dispense Refill  . aspirin 81 MG tablet Take 81 mg by mouth daily.        [name] atorvastatin (LIPITOR) 40 MG tablet Take 1 tablet (40 mg total) by mouth daily.  90 tablet  3  . B-D ULTRAFINE III SHORT PEN 31G X 8 MM MISC USE TWICE DAILY FOR INSULIN INJECTION  180 each  3  . bisacodyl (DULCOLAX) 5 MG EC tablet Take 5 mg by mouth daily as needed.        . carvedilol (COREG) 25 MG tablet Take 1.5 tablets (37.5 mg total) by mouth 2 (two) times daily with a meal. Take one and one half tablet twice daily. PT WANTS BRAND ONLY   90 tablet  6  . Colesevelam HCl 3.75 G PACK Take 1 each by mouth daily.  30 each  11  . furosemide (LASIX) 80 MG tablet Take 80 mg by mouth daily.      . insulin aspart (NOVOLOG) 100 UNIT/ML injection Inject into the skin. 15 units at breakfast 10units at lunch 10 units at dinner      . insulin glargine (LANTUS) 100 UNIT/ML injection Inject 40 Units into the skin at bedtime.       . levalbuterol (XOPENEX HFA) 45 MCG/ACT inhaler Inhale 1-2 puffs into the lungs  every 4 (four) hours as needed for wheezing.  1 Inhaler  12  . mometasone-formoterol (DULERA) 100-5 MCG/ACT AERO Inhale 2 puffs into the lungs 2 (two) times daily.  1 Inhaler  5  . olmesartan (BENICAR) 40 MG tablet Take 1 tablet (40 mg total) by mouth daily.  30 tablet  3  . pantoprazole (PROTONIX) 40 MG tablet TAKE ONE TABLET BY MOUTH DAILY  90 tablet  0  . Polyethylene Glycol 3350 (MIRALAX PO) Take by mouth. As directed        . potassium chloride (MICRO-K) 10 MEQ CR capsule Take 10 mEq by mouth daily.        . SitaGLIPtin-MetFORMIN HCl 50-1000 MG TB24 Take 1 tablet by mouth daily.  112 tablet  0  . venlafaxine (EFFEXOR XR) 150 MG 24 hr capsule Take 150 mg by mouth 2 (two) times daily.        [name] DISCONTD: budesonide-formoterol (SYMBICORT) 80-4.5 MCG/ACT inhaler Inhale 2 puffs into the lungs 2 (two) times daily.  1 Inhaler  12    Past Medical History  Diagnosis Date  . DM   . DYSLIPIDEMIA   . OBESITY NOS   . DEPRESSION, RECURRENT, IN PARTIAL REMISSION   .  HYPERTENSION   . CORONARY ARTERY DISEASE   . CONGESTIVE HEART FAILURE   . GERD   . DIVERTICULOSIS, COLON, WITH HEMORRHAGE   . BACK PAIN, LUMBAR, WITH RADICULOPATHY   . ABNORMAL ELECTROCARDIOGRAM   . OBSTRUCTIVE SLEEP APNEA   . Asthma     Past Surgical History  Procedure Date  . Uvuloplasty   . Total abdominal hysterectomy w/ bilateral salpingoophorectomy   . Abdominal hysterectomy   . Cholecystectomy     ROS  As stated in the HPI and negative for all other systems.  PHYSICAL EXAM BP 155/85  Pulse 64  Ht 5\' 10"  (1.778 m)  Wt 218 lb 1.9 oz (98.939 kg)  BMI 31.30 kg/m2 GENERAL:  Well appearing HEENT:  Pupils equal round and reactive, fundi not visualized, oral mucosa unremarkable NECK:  No jugular venous distention, waveform within normal limits, carotid upstroke brisk and symmetric, no bruits, no thyromegaly LYMPHATICS:  No cervical, inguinal adenopathy LUNGS:  Clear to auscultation bilaterally BACK:  No CVA  tenderness CHEST:  Unremarkable HEART:  PMI not displaced or sustained,S1 and S2 within normal limits, no S3, no S4, no clicks, no rubs, no murmurs ABD:  Flat, positive bowel sounds normal in frequency in pitch, no bruits, no rebound, no guarding, no midline pulsatile mass, no hepatomegaly, no splenomegaly, obese EXT:  2 plus pulses throughout, no edema, no cyanosis no clubbing, 10 by 12 mobile soft nonpulsatile mass medial aspect of the antecubital fossa  SKIN:  No rashes no nodules NEURO:  Cranial nerves II through XII grossly intact, motor grossly intact throughout PSYCH:  Cognitively intact, oriented to person place and time  ASSESSMENT AND PLAN

## 2011-04-20 NOTE — Assessment & Plan Note (Signed)
The patient had nonobstructive coronary disease at her last catheterization. She's having no new symptoms. She can continue with risk reduction.

## 2011-04-20 NOTE — Assessment & Plan Note (Signed)
Her blood pressure is elevated here but she says it is not at home. She takes it routinely. She will continue the meds as listed and with therapeutic lifestyle changes.

## 2011-04-20 NOTE — Patient Instructions (Signed)
The current medical regimen is effective;  continue present plan and medications.  Follow up in 1 year with Dr Hochrein.  You will receive a letter in the mail 2 months before you are due.  Please call us when you receive this letter to schedule your follow up appointment.  

## 2011-04-20 NOTE — Assessment & Plan Note (Signed)
Her LDL in September was 53 with an HDL of 60. She will continue with the medications as listed.

## 2011-04-20 NOTE — Assessment & Plan Note (Signed)
I sent him acids to Dr. Felicity Coyer who was considering further referral for evaluation of this mass. It does not appear to be vascular.

## 2011-04-20 NOTE — Assessment & Plan Note (Signed)
I reviewed her labs and her last hemoglobin A1c was 9.1. We discussed this. She says her sugars are better controlled since this reading in September and she will follow with her primary provider.

## 2011-04-20 NOTE — Telephone Encounter (Signed)
[name] - please call pt - let her know to expect call from [facility] re: ortho refer  ----------------------------------------------------------- Thanks for the note Jake Will refer to ortho r/o mechanical issue since not vascular problem - my office will call re: same Thanks VAL  ===View-only below this line===  ----- Message -----    From: [name], MD    Sent: 04/20/2011  11:42 AM      To: [name], MD  [name],  [name] was wondering what the next step would be to evaluate the soft tissue mass on her arm.  Doppler was negative.

## 2011-04-21 ENCOUNTER — Other Ambulatory Visit: Payer: Self-pay | Admitting: Cardiology

## 2011-04-24 ENCOUNTER — Encounter: Payer: Self-pay | Admitting: Pulmonary Disease

## 2011-04-24 ENCOUNTER — Ambulatory Visit: Payer: Medicare Other | Admitting: Rehabilitative and Restorative Service Providers"

## 2011-04-26 ENCOUNTER — Telehealth: Payer: Self-pay | Admitting: Cardiology

## 2011-04-26 ENCOUNTER — Ambulatory Visit: Payer: Medicare Other | Admitting: Rehabilitative and Restorative Service Providers"

## 2011-04-26 MED ORDER — PANTOPRAZOLE SODIUM 40 MG PO TBEC: 40.0000 mg | DELAYED_RELEASE_TABLET | Freq: Two times a day (BID) | ORAL | Status: DC

## 2011-04-26 NOTE — Telephone Encounter (Signed)
New msg Pt wants refill of protonix 40 mg.  Please call to [facility]  She takes twice per day. Please call her back

## 2011-04-26 NOTE — Telephone Encounter (Signed)
Pam please address with Dr Antoine Poche if med to be BID.

## 2011-04-27 ENCOUNTER — Encounter: Payer: Medicare Other | Admitting: Rehabilitative and Restorative Service Providers"

## 2011-05-01 ENCOUNTER — Encounter (HOSPITAL_COMMUNITY): Payer: Self-pay | Admitting: Emergency Medicine

## 2011-05-01 ENCOUNTER — Emergency Department (HOSPITAL_COMMUNITY): Payer: Medicare Other

## 2011-05-01 ENCOUNTER — Inpatient Hospital Stay (HOSPITAL_COMMUNITY)
Admission: EM | Admit: 2011-05-01 | Discharge: 2011-05-05 | DRG: 286 | Disposition: A | Discharge status: Home Health | Payer: Medicare Other | Attending: Internal Medicine | Admitting: Internal Medicine

## 2011-05-01 ENCOUNTER — Encounter: Payer: Self-pay | Admitting: Physical Medicine and Rehabilitation

## 2011-05-01 ENCOUNTER — Encounter
Payer: Medicare Other | Attending: Physical Medicine and Rehabilitation | Admitting: Physical Medicine and Rehabilitation

## 2011-05-01 VITALS — BP 174/92 | HR 82 | Resp 18 | Ht 70.0 in | Wt 216.0 lb

## 2011-05-01 DIAGNOSIS — D509 Iron deficiency anemia, unspecified: Secondary | ICD-10-CM | POA: Diagnosis present

## 2011-05-01 DIAGNOSIS — I2510 Atherosclerotic heart disease of native coronary artery without angina pectoris: Principal | ICD-10-CM

## 2011-05-01 DIAGNOSIS — M545 Low back pain, unspecified: Secondary | ICD-10-CM | POA: Insufficient documentation

## 2011-05-01 DIAGNOSIS — Z9861 Coronary angioplasty status: Secondary | ICD-10-CM

## 2011-05-01 DIAGNOSIS — M542 Cervicalgia: Secondary | ICD-10-CM | POA: Insufficient documentation

## 2011-05-01 DIAGNOSIS — E669 Obesity, unspecified: Secondary | ICD-10-CM | POA: Insufficient documentation

## 2011-05-01 DIAGNOSIS — R739 Hyperglycemia, unspecified: Secondary | ICD-10-CM

## 2011-05-01 DIAGNOSIS — D649 Anemia, unspecified: Secondary | ICD-10-CM

## 2011-05-01 DIAGNOSIS — M79601 Pain in right arm: Secondary | ICD-10-CM

## 2011-05-01 DIAGNOSIS — Z880 Allergy status to penicillin: Secondary | ICD-10-CM

## 2011-05-01 DIAGNOSIS — Z794 Long term (current) use of insulin: Secondary | ICD-10-CM

## 2011-05-01 DIAGNOSIS — K219 Gastro-esophageal reflux disease without esophagitis: Secondary | ICD-10-CM

## 2011-05-01 DIAGNOSIS — F29 Unspecified psychosis not due to a substance or known physiological condition: Secondary | ICD-10-CM | POA: Diagnosis not present

## 2011-05-01 DIAGNOSIS — E785 Hyperlipidemia, unspecified: Secondary | ICD-10-CM

## 2011-05-01 DIAGNOSIS — R112 Nausea with vomiting, unspecified: Secondary | ICD-10-CM | POA: Diagnosis not present

## 2011-05-01 DIAGNOSIS — M47812 Spondylosis without myelopathy or radiculopathy, cervical region: Secondary | ICD-10-CM | POA: Insufficient documentation

## 2011-05-01 DIAGNOSIS — G9349 Other encephalopathy: Secondary | ICD-10-CM | POA: Diagnosis present

## 2011-05-01 DIAGNOSIS — K589 Irritable bowel syndrome without diarrhea: Secondary | ICD-10-CM

## 2011-05-01 DIAGNOSIS — G9340 Encephalopathy, unspecified: Secondary | ICD-10-CM

## 2011-05-01 DIAGNOSIS — M5412 Radiculopathy, cervical region: Secondary | ICD-10-CM

## 2011-05-01 DIAGNOSIS — R079 Chest pain, unspecified: Secondary | ICD-10-CM | POA: Diagnosis present

## 2011-05-01 DIAGNOSIS — I161 Hypertensive emergency: Secondary | ICD-10-CM

## 2011-05-01 DIAGNOSIS — E119 Type 2 diabetes mellitus without complications: Secondary | ICD-10-CM

## 2011-05-01 DIAGNOSIS — IMO0002 Reserved for concepts with insufficient information to code with codable children: Secondary | ICD-10-CM

## 2011-05-01 DIAGNOSIS — F329 Major depressive disorder, single episode, unspecified: Secondary | ICD-10-CM | POA: Diagnosis present

## 2011-05-01 DIAGNOSIS — I252 Old myocardial infarction: Secondary | ICD-10-CM

## 2011-05-01 DIAGNOSIS — Z87891 Personal history of nicotine dependence: Secondary | ICD-10-CM

## 2011-05-01 DIAGNOSIS — I10 Essential (primary) hypertension: Secondary | ICD-10-CM

## 2011-05-01 DIAGNOSIS — J45909 Unspecified asthma, uncomplicated: Secondary | ICD-10-CM | POA: Diagnosis present

## 2011-05-01 DIAGNOSIS — I509 Heart failure, unspecified: Secondary | ICD-10-CM

## 2011-05-01 DIAGNOSIS — R9389 Abnormal findings on diagnostic imaging of other specified body structures: Secondary | ICD-10-CM

## 2011-05-01 DIAGNOSIS — G4733 Obstructive sleep apnea (adult) (pediatric): Secondary | ICD-10-CM

## 2011-05-01 DIAGNOSIS — F3289 Other specified depressive episodes: Secondary | ICD-10-CM | POA: Diagnosis present

## 2011-05-01 DIAGNOSIS — I5022 Chronic systolic (congestive) heart failure: Secondary | ICD-10-CM | POA: Diagnosis present

## 2011-05-01 DIAGNOSIS — M79609 Pain in unspecified limb: Secondary | ICD-10-CM | POA: Insufficient documentation

## 2011-05-01 DIAGNOSIS — Z79899 Other long term (current) drug therapy: Secondary | ICD-10-CM

## 2011-05-01 DIAGNOSIS — Z7982 Long term (current) use of aspirin: Secondary | ICD-10-CM

## 2011-05-01 DIAGNOSIS — E1129 Type 2 diabetes mellitus with other diabetic kidney complication: Secondary | ICD-10-CM | POA: Diagnosis present

## 2011-05-01 DIAGNOSIS — I200 Unstable angina: Secondary | ICD-10-CM | POA: Diagnosis present

## 2011-05-01 DIAGNOSIS — M5030 Other cervical disc degeneration, unspecified cervical region: Secondary | ICD-10-CM | POA: Insufficient documentation

## 2011-05-01 DIAGNOSIS — Z888 Allergy status to other drugs, medicaments and biological substances status: Secondary | ICD-10-CM

## 2011-05-01 DIAGNOSIS — E876 Hypokalemia: Secondary | ICD-10-CM | POA: Diagnosis not present

## 2011-05-01 LAB — BASIC METABOLIC PANEL
BUN: 14 mg/dL (ref 6–23)
GFR calc Af Amer: 60 mL/min — ABNORMAL LOW (ref >90)
GFR calc non Af Amer: 52 mL/min — ABNORMAL LOW (ref >90)
Potassium: 3.8 mEq/L (ref 3.5–5.1)

## 2011-05-01 LAB — POCT I-STAT, CHEM 8
Calcium, Ion: 1.19 mmol/L (ref 1.12–1.32)
Glucose, Bld: 214 mg/dL — ABNORMAL HIGH (ref 70–99)
HCT: 37 % (ref 36.0–46.0)
Hemoglobin: 12.6 g/dL (ref 12.0–15.0)
Potassium: 3.8 mEq/L (ref 3.5–5.1)

## 2011-05-01 LAB — CBC
HCT: 35.6 % — ABNORMAL LOW (ref 36.0–46.0)
MCHC: 32.9 g/dL (ref 30.0–36.0)
RDW: 15.6 % — ABNORMAL HIGH (ref 11.5–15.5)

## 2011-05-01 MED ORDER — NITROGLYCERIN 0.4 MG SL SUBL
0.4000 mg | SUBLINGUAL_TABLET | SUBLINGUAL | Status: DC | PRN
  Administered 2011-05-01 (×2): 0.4 mg via SUBLINGUAL
  Filled 2011-05-01: qty 25

## 2011-05-01 MED ORDER — ASPIRIN 81 MG PO CHEW
324.0000 mg | CHEWABLE_TABLET | Freq: Once | ORAL | Status: AC
  Administered 2011-05-01: 324 mg via ORAL
  Filled 2011-05-01: qty 4

## 2011-05-01 NOTE — ED Notes (Signed)
PT reports SOB and chest tightness this afternoon and lasted a fifteen minutes. Pt was dx with asthma this year. Heart attack in 2002; stent placed.

## 2011-05-01 NOTE — ED Notes (Signed)
Pt stated that she is having chest tightness again, and would like a breathing treatment. Listened to pts lungs and they are clear but diminished. No wheezing. Informed Dr. Karma Ganja and she stated that she does not want to give breathing treatment, but instead nitroglycerin SL. Informed pt. Will continue to monitor.

## 2011-05-01 NOTE — ED Notes (Signed)
Pt stated that she feels like her blood sugar is low. CBG 195. Will continue to monitor.

## 2011-05-01 NOTE — ED Notes (Signed)
Pt stated that she started having chest pressure this afternoon. Pt stated that the pressure is in the middle of the chest. Pain lasted for about 15 minutes.  Does not radiate. Pt stated that she was nauseated but no vomiting. Pt stated that she had a stent placed, in the past. She stated that she was SOB starting today. No respiratory distress. Pt stated that her hands were also swollen, starting with  CP. No swelling at this time. Will continue to monitor.

## 2011-05-01 NOTE — ED Provider Notes (Signed)
History     CSN: [id number]  Arrival date & time 05/01/11  1919   First MD Initiated Contact with Patient 05/01/11 2022      Chief Complaint  Patient presents with  . Chest Pain    (Consider location/radiation/quality/duration/timing/severity/associated sxs/prior treatment) HPI Pt presents with c/o chest pain.  Pt states she had approx 15 minute episode earlier today while lying in bed of midsternal chest pain and tightness.  She states this was associated with sob.  No diaphoresis or nausea, no radiation of pain.  Pt states she has a hx of cardiac stent placed in 2002.  Denies recent illness, no fever/cough, leg swelling.  No recent travel or hx of DVT/PE, no recent surgery or trauma.  Pt states she took a baby aspirin, but no other treatment for her symptoms prior to arrival.  There are no other associated systemic symptoms.  There are no alleviating or modifying factors.   Past Medical History  Diagnosis Date  . DM   . DYSLIPIDEMIA   . OBESITY NOS   . DEPRESSION, RECURRENT, IN PARTIAL REMISSION   . HYPERTENSION   . 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 CONGESTIVE HEART FAILURE     Cath 05/2008 with non obstructive CAD but global hypokinesis, EF 30-35%  . GERD   . DIVERTICULOSIS, COLON, WITH HEMORRHAGE   . BACK PAIN, LUMBAR, WITH RADICULOPATHY   . ABNORMAL ELECTROCARDIOGRAM   . OBSTRUCTIVE SLEEP APNEA   . Asthma     Past Surgical History  Procedure Date  . Uvuloplasty   . Total abdominal hysterectomy w/ bilateral salpingoophorectomy   . Abdominal hysterectomy   . Cholecystectomy     Family History  Problem Relation Age of Onset  . Stroke Mother   . Breast cancer Sister   . Emphysema Sister   . Cancer Sister     breast  . Alcohol abuse Brother   . Colon cancer      uncles x 2  . Prostate cancer Brother     History  Substance Use Topics  . Smoking status: Former Smoker -- 1.0 packs/day for 20 years    Types:  Cigarettes    Quit date: 01/03/2007  . Smokeless tobacco: Never Used  . Alcohol Use: No    OB History    Grav Para Term Preterm Abortions TAB SAB Ect Mult Living                  Review of Systems ROS reviewed and all otherwise negative except for mentioned in HPI  Allergies  Amlodipine; Ramipril; Adhesive; Codeine; and Penicillins  Home Medications   No current outpatient prescriptions on file.  BP 146/77  Pulse 93  Temp(Src) 97.9 F (36.6 C) (Oral)  Resp 26  Ht 5\' 10"  (1.778 m)  Wt 199 lb 4.7 oz (90.4 kg)  BMI 28.60 kg/m2  SpO2 99% Vitals reviewed Physical Exam Physical Examination: General appearance - alert, well appearing, and in no distress Mental status - alert, oriented to person, place, and time Eyes - pupils equal and reactive, no conjunctival injection Mouth - mucous membranes moist, pharynx normal without lesions Chest - clear to auscultation, no wheezes, rales or rhonchi, symmetric air entry Heart - normal rate, regular rhythm, normal S1, S2, no murmurs, rubs, clicks or gallops Abdomen - soft, nontender, nondistended, no masses or organomegaly, nabs Neurological - alert, oriented, normal speech, no focal findings Extremities - peripheral  pulses normal, no pedal edema, no clubbing or cyanosis Skin - normal coloration and turgor, no rashes Psych- normal mood and affect  ED Course  Procedures (including critical care time)   Date: 05/01/2011  Rate: 94  Rhythm: indeterminate, ? afib  QRS Axis: normal  Intervals: indeterminate  ST/T Wave abnormalities: normal  Conduction Disutrbances:none  Narrative Interpretation:   Old EKG Reviewed: changes noted, p waves less evident compared to prior ekg of 02/04/11  11:54 PM  D/w [facility] for admission, pt to go to Team 5 telemetry    Labs Reviewed  CBC - Abnormal; Notable for the following:    Hemoglobin 11.7 (*)    HCT 35.6 (*)    RDW 15.6 (*)    All other components within normal limits  BASIC METABOLIC  PANEL - Abnormal; Notable for the following:    Chloride 94 (*)    Glucose, Bld 204 (*)    GFR calc non Af Amer 52 (*)    GFR calc Af Amer 60 (*)    All other components within normal limits  PRO B NATRIURETIC PEPTIDE - Abnormal; Notable for the following:    Pro B Natriuretic peptide (BNP) 1119.0 (*)    All other components within normal limits  POCT I-STAT, CHEM 8 - Abnormal; Notable for the following:    Glucose, Bld 214 (*)    All other components within normal limits  BASIC METABOLIC PANEL - Abnormal; Notable for the following:    Glucose, Bld 210 (*)    GFR calc non Af Amer 62 (*)    GFR calc Af Amer 72 (*)    All other components within normal limits  CBC - Abnormal; Notable for the following:    Hemoglobin 11.9 (*)    HCT 35.9 (*)    All other components within normal limits  GLUCOSE, CAPILLARY - Abnormal; Notable for the following:    Glucose-Capillary 195 (*)    All other components within normal limits  GLUCOSE, CAPILLARY - Abnormal; Notable for the following:    Glucose-Capillary 216 (*)    All other components within normal limits  GLUCOSE, CAPILLARY - Abnormal; Notable for the following:    Glucose-Capillary 193 (*)    All other components within normal limits  GLUCOSE, CAPILLARY - Abnormal; Notable for the following:    Glucose-Capillary 241 (*)    All other components within normal limits  CBC - Abnormal; Notable for the following:    WBC 12.3 (*)    RDW 15.8 (*)    All other components within normal limits  BASIC METABOLIC PANEL - Abnormal; Notable for the following:    Potassium 3.4 (*)    Chloride 94 (*)    Glucose, Bld 310 (*)    GFR calc non Af Amer 72 (*)    GFR calc Af Amer 84 (*)    All other components within normal limits  CBC - Abnormal; Notable for the following:    WBC 12.1 (*)    RDW 15.6 (*)    All other components within normal limits  DIFFERENTIAL - Abnormal; Notable for the following:    Neutrophils Relative 80 (*)    Neutro Abs 9.6  (*)    All other components within normal limits  SEDIMENTATION RATE - Abnormal; Notable for the following:    Sed Rate 60 (*)    All other components within normal limits  GLUCOSE, CAPILLARY - Abnormal; Notable for the following:    Glucose-Capillary 323 (*)  All other components within normal limits  GLUCOSE, CAPILLARY - Abnormal; Notable for the following:    Glucose-Capillary 394 (*)    All other components within normal limits  GLUCOSE, CAPILLARY - Abnormal; Notable for the following:    Glucose-Capillary 331 (*)    All other components within normal limits  GLUCOSE, CAPILLARY - Abnormal; Notable for the following:    Glucose-Capillary 360 (*)    All other components within normal limits  GLUCOSE, CAPILLARY - Abnormal; Notable for the following:    Glucose-Capillary 300 (*)    All other components within normal limits  GLUCOSE, CAPILLARY - Abnormal; Notable for the following:    Glucose-Capillary 271 (*)    All other components within normal limits  GLUCOSE, CAPILLARY - Abnormal; Notable for the following:    Glucose-Capillary 202 (*)    All other components within normal limits  GLUCOSE, CAPILLARY - Abnormal; Notable for the following:    Glucose-Capillary 173 (*)    All other components within normal limits  GLUCOSE, CAPILLARY - Abnormal; Notable for the following:    Glucose-Capillary 156 (*)    All other components within normal limits  GLUCOSE, CAPILLARY - Abnormal; Notable for the following:    Glucose-Capillary 125 (*)    All other components within normal limits  POCT I-STAT TROPONIN I  CARDIAC PANEL(CRET KIN+CKTOT+MB+TROPI)  PROTIME-INR  CARDIAC PANEL(CRET KIN+CKTOT+MB+TROPI)  CARDIAC PANEL(CRET KIN+CKTOT+MB+TROPI)  MRSA PCR SCREENING   Dg Chest 2 View  05/01/2011  *RADIOLOGY REPORT*  Clinical Data: Left side chest tightness and pain for 2 days, history diabetes, hypertension, coronary artery disease, CHF  CHEST - 2 VIEW  Comparison: 03/30/2011  Findings:  Enlargement of cardiac silhouette. Tortuous aorta. Pulmonary vascularity normal. Right basilar atelectasis. Lungs otherwise clear. No pleural effusion or pneumothorax. Diffuse osseous demineralization.  IMPRESSION: Enlargement of cardiac silhouette. Right basilar atelectasis.  Original Report Authenticated By: Lollie Marrow, M.D.   Dg Abd 1 View  05/02/2011  *RADIOLOGY REPORT*  Clinical Data: Left lower quadrant abdominal pain  ABDOMEN - 1 VIEW  Comparison: 02/04/2011  Findings: Nonobstructive bowel gas pattern.  Bilateral hip and lower lumbar degenerative changes.  Surgical clips right upper quadrant.  Atherosclerotic vascular calcification.  IMPRESSION: Nonobstructive bowel gas pattern.  Original Report Authenticated By: Waneta Martins, M.D.   Ct Head Wo Contrast  05/02/2011  *RADIOLOGY REPORT*  Clinical Data: 66 year old female with headache.  Earlier cardiac catheterization.  CT HEAD WITHOUT CONTRAST  Technique:  Contiguous axial images were obtained from the base of the skull through the vertex without contrast.  Comparison: 08/04/2005.  Findings: Visualized paranasal sinuses and mastoids are clear. Dural calcifications. No acute osseous abnormality identified. Visualized orbits and scalp soft tissues are within normal limits.  Stable cerebral volume.  No ventriculomegaly.  Patchy confluent cerebral white matter hypodensity has increased. No evidence of cortically based acute infarction identified.  No acute intracranial hemorrhage identified.  No midline shift, mass effect, or evidence of mass lesion.  No suspicious intracranial vascular hyperdensity.  IMPRESSION: 1. No acute intracranial abnormality. 2.  Progression of nonspecific cerebral white matter changes since 2007, favor chronic small vessel disease.  Original Report Authenticated By: Ulla Potash III, M.D.     1. Chest pain 2. hypertension    MDM  Pt with hx of cardiac stent presents with c/o chest pain.  Pt states symptoms resolved  after 15 minutes.  EKG and first troponin reassuring in ED.  BNP slightly elevated, but no pulmonary edema on CXR.  Pt admitted to [facility] hospitalist for further evaluation and management        Ethelda Chick, MD 05/03/11 1521

## 2011-05-01 NOTE — Progress Notes (Signed)
EMG/NCV right upper extremity  performed 05/01/2011.  See  report under media tab.

## 2011-05-02 ENCOUNTER — Ambulatory Visit: Payer: Medicare Other | Admitting: Rehabilitative and Restorative Service Providers"

## 2011-05-02 ENCOUNTER — Encounter: Payer: Self-pay | Admitting: Physical Medicine and Rehabilitation

## 2011-05-02 ENCOUNTER — Encounter: Payer: Medicare Other | Admitting: Rehabilitative and Restorative Service Providers"

## 2011-05-02 ENCOUNTER — Encounter (HOSPITAL_COMMUNITY)
Admission: EM | Disposition: A | Discharge status: Home Health | Payer: Self-pay | Source: Home / Self Care | Attending: Internal Medicine

## 2011-05-02 ENCOUNTER — Inpatient Hospital Stay (HOSPITAL_COMMUNITY): Payer: Medicare Other

## 2011-05-02 ENCOUNTER — Encounter (HOSPITAL_COMMUNITY): Payer: Self-pay | Admitting: Internal Medicine

## 2011-05-02 ENCOUNTER — Telehealth: Payer: Self-pay

## 2011-05-02 DIAGNOSIS — R079 Chest pain, unspecified: Secondary | ICD-10-CM | POA: Diagnosis present

## 2011-05-02 DIAGNOSIS — I2510 Atherosclerotic heart disease of native coronary artery without angina pectoris: Secondary | ICD-10-CM

## 2011-05-02 HISTORY — PX: LEFT HEART CATHETERIZATION WITH CORONARY ANGIOGRAM: SHX5451

## 2011-05-02 LAB — CBC
HCT: 35.9 % — ABNORMAL LOW (ref 36.0–46.0)
Hemoglobin: 12.9 g/dL (ref 12.0–15.0)
MCH: 28.7 pg (ref 26.0–34.0)
MCHC: 33.3 g/dL (ref 30.0–36.0)
MCV: 86 fL (ref 78.0–100.0)
Platelets: 264 10*3/uL (ref 150–400)
RBC: 4.18 MIL/uL (ref 3.87–5.11)
RBC: 4.5 MIL/uL (ref 3.87–5.11)
RDW: 15.5 % (ref 11.5–15.5)
WBC: 7.5 10*3/uL (ref 4.0–10.5)

## 2011-05-02 LAB — DIFFERENTIAL
Basophils Relative: 0 % (ref 0–1)
Eosinophils Absolute: 0.1 10*3/uL (ref 0.0–0.7)
Eosinophils Relative: 1 % (ref 0–5)
Lymphs Abs: 1.9 10*3/uL (ref 0.7–4.0)
Monocytes Absolute: 0.5 10*3/uL (ref 0.1–1.0)
Monocytes Relative: 4 % (ref 3–12)

## 2011-05-02 LAB — CARDIAC PANEL(CRET KIN+CKTOT+MB+TROPI)
CK, MB: 3 ng/mL (ref 0.3–4.0)
CK, MB: 3.2 ng/mL (ref 0.3–4.0)
Relative Index: INVALID (ref 0.0–2.5)
Relative Index: INVALID (ref 0.0–2.5)
Total CK: 71 U/L (ref 7–177)
Total CK: 74 U/L (ref 7–177)
Troponin I: <0.3 ng/mL (ref <0.30)
Troponin I: <0.3 ng/mL

## 2011-05-02 LAB — BASIC METABOLIC PANEL
BUN: 10 mg/dL (ref 6–23)
CO2: 31 mEq/L (ref 19–32)
CO2: 28 mEq/L (ref 19–32)
Chloride: 98 mEq/L (ref 96–112)
Creatinine, Ser: 0.93 mg/dL (ref 0.50–1.10)
GFR calc Af Amer: 72 mL/min — ABNORMAL LOW (ref >90)
Glucose, Bld: 310 mg/dL — ABNORMAL HIGH (ref 70–99)
Potassium: 4.2 mEq/L (ref 3.5–5.1)
Potassium: 3.4 mEq/L — ABNORMAL LOW (ref 3.5–5.1)
Sodium: 140 mEq/L (ref 135–145)
Sodium: 136 mEq/L (ref 135–145)

## 2011-05-02 LAB — GLUCOSE, CAPILLARY
Glucose-Capillary: 195 mg/dL — ABNORMAL HIGH (ref 70–99)
Glucose-Capillary: 193 mg/dL — ABNORMAL HIGH (ref 70–99)
Glucose-Capillary: 241 mg/dL — ABNORMAL HIGH (ref 70–99)

## 2011-05-02 SURGERY — LEFT HEART CATHETERIZATION WITH CORONARY ANGIOGRAM
Anesthesia: LOCAL

## 2011-05-02 MED ORDER — LABETALOL HCL 5 MG/ML IV SOLN
10.0000 mg | Freq: Once | INTRAVENOUS | Status: AC
  Administered 2011-05-02: 10 mg via INTRAVENOUS
  Filled 2011-05-02: qty 4

## 2011-05-02 MED ORDER — HEPARIN SODIUM (PORCINE) 5000 UNIT/ML IJ SOLN
5000.0000 [IU] | Freq: Three times a day (TID) | INTRAMUSCULAR | Status: DC
  Administered 2011-05-02: 5000 [IU] via SUBCUTANEOUS
  Filled 2011-05-02 (×4): qty 1

## 2011-05-02 MED ORDER — LABETALOL HCL 5 MG/ML IV SOLN
10.0000 mg | INTRAVENOUS | Status: DC | PRN
  Administered 2011-05-02: 10 mg via INTRAVENOUS
  Filled 2011-05-02: qty 4

## 2011-05-02 MED ORDER — HEPARIN SODIUM (PORCINE) 1000 UNIT/ML IJ SOLN
INTRAMUSCULAR | Status: AC
  Filled 2011-05-02: qty 1

## 2011-05-02 MED ORDER — INSULIN ASPART 100 UNIT/ML ~~LOC~~ SOLN
0.0000 [IU] | Freq: Three times a day (TID) | SUBCUTANEOUS | Status: DC
  Administered 2011-05-02: 7 [IU] via SUBCUTANEOUS

## 2011-05-02 MED ORDER — ASPIRIN EC 81 MG PO TBEC
81.0000 mg | DELAYED_RELEASE_TABLET | Freq: Every day | ORAL | Status: DC
  Administered 2011-05-03 – 2011-05-05 (×3): 81 mg via ORAL
  Filled 2011-05-02 (×3): qty 1

## 2011-05-02 MED ORDER — SODIUM CHLORIDE 0.9 % IV SOLN: INTRAVENOUS | Status: DC

## 2011-05-02 MED ORDER — CLONIDINE HCL 0.1 MG PO TABS
0.1000 mg | ORAL_TABLET | Freq: Two times a day (BID) | ORAL | Status: DC
  Filled 2011-05-02: qty 1

## 2011-05-02 MED ORDER — NITROGLYCERIN IN D5W 200-5 MCG/ML-% IV SOLN
20.0000 ug/min | INTRAVENOUS | Status: DC
  Administered 2011-05-02: 10 ug/min via INTRAVENOUS
  Filled 2011-05-02: qty 250

## 2011-05-02 MED ORDER — BISACODYL 10 MG RE SUPP: 10.0000 mg | Freq: Every day | RECTAL | Status: DC | PRN

## 2011-05-02 MED ORDER — MORPHINE SULFATE 2 MG/ML IJ SOLN
2.0000 mg | INTRAMUSCULAR | Status: DC | PRN
  Administered 2011-05-02: 2 mg via INTRAVENOUS
  Filled 2011-05-02: qty 1

## 2011-05-02 MED ORDER — PROMETHAZINE HCL 25 MG/ML IJ SOLN
12.5000 mg | INTRAMUSCULAR | Status: DC | PRN
  Administered 2011-05-02: 12.5 mg via INTRAVENOUS
  Filled 2011-05-02: qty 1

## 2011-05-02 MED ORDER — FLEET ENEMA 7-19 GM/118ML RE ENEM
1.0000 | ENEMA | Freq: Once | RECTAL | Status: AC | PRN
  Filled 2011-05-02: qty 1

## 2011-05-02 MED ORDER — ONDANSETRON HCL 4 MG/2ML IJ SOLN
4.0000 mg | Freq: Once | INTRAMUSCULAR | Status: AC
  Administered 2011-05-02: 4 mg via INTRAVENOUS
  Filled 2011-05-02: qty 2

## 2011-05-02 MED ORDER — HYDRALAZINE HCL 20 MG/ML IJ SOLN
10.0000 mg | INTRAMUSCULAR | Status: DC | PRN
  Administered 2011-05-02 – 2011-05-04 (×3): 10 mg via INTRAVENOUS
  Filled 2011-05-02: qty 0.5
  Filled 2011-05-02 (×2): qty 1

## 2011-05-02 MED ORDER — ACETAMINOPHEN 325 MG PO TABS
650.0000 mg | ORAL_TABLET | Freq: Four times a day (QID) | ORAL | Status: DC | PRN
  Administered 2011-05-02: 650 mg via ORAL
  Filled 2011-05-02: qty 2

## 2011-05-02 MED ORDER — NITROGLYCERIN 0.2 MG/ML ON CALL CATH LAB
INTRAVENOUS | Status: AC
  Filled 2011-05-02: qty 1

## 2011-05-02 MED ORDER — INSULIN GLARGINE 100 UNIT/ML ~~LOC~~ SOLN
10.0000 [IU] | Freq: Every day | SUBCUTANEOUS | Status: DC
  Administered 2011-05-02: 10 [IU] via SUBCUTANEOUS

## 2011-05-02 MED ORDER — MORPHINE SULFATE 2 MG/ML IJ SOLN
4.0000 mg | INTRAMUSCULAR | Status: DC | PRN
  Administered 2011-05-02 (×2): 4 mg via INTRAVENOUS
  Filled 2011-05-02: qty 2
  Filled 2011-05-02: qty 1
  Filled 2011-05-02 (×2): qty 2

## 2011-05-02 MED ORDER — ASPIRIN EC 325 MG PO TBEC: 325.0000 mg | DELAYED_RELEASE_TABLET | Freq: Every day | ORAL | Status: DC

## 2011-05-02 MED ORDER — ASPIRIN 81 MG PO CHEW
324.0000 mg | CHEWABLE_TABLET | Freq: Once | ORAL | Status: AC
  Administered 2011-05-02: 324 mg via ORAL

## 2011-05-02 MED ORDER — SODIUM CHLORIDE 0.9 % IV SOLN
INTRAVENOUS | Status: DC
  Administered 2011-05-02 (×2): via INTRAVENOUS

## 2011-05-02 MED ORDER — SODIUM CHLORIDE 0.9 % IV SOLN
250.0000 mL | INTRAVENOUS | Status: DC | PRN
  Administered 2011-05-02: 250 mL via INTRAVENOUS

## 2011-05-02 MED ORDER — DIAZEPAM 5 MG PO TABS
5.0000 mg | ORAL_TABLET | ORAL | Status: AC
  Administered 2011-05-02: 5 mg via ORAL
  Filled 2011-05-02: qty 1

## 2011-05-02 MED ORDER — SODIUM CHLORIDE 0.9 % IJ SOLN
3.0000 mL | Freq: Two times a day (BID) | INTRAMUSCULAR | Status: DC
Start: 2011-05-02 — End: 2011-05-02

## 2011-05-02 MED ORDER — CARVEDILOL 25 MG PO TABS
37.5000 mg | ORAL_TABLET | Freq: Two times a day (BID) | ORAL | Status: DC
  Administered 2011-05-02 (×2): 37.5 mg via ORAL
  Filled 2011-05-02 (×4): qty 1

## 2011-05-02 MED ORDER — MORPHINE SULFATE 2 MG/ML IJ SOLN
2.0000 mg | INTRAMUSCULAR | Status: DC | PRN
  Administered 2011-05-02 (×3): 2 mg via INTRAVENOUS
  Filled 2011-05-02 (×2): qty 1

## 2011-05-02 MED ORDER — ALUM & MAG HYDROXIDE-SIMETH 200-200-20 MG/5ML PO SUSP: 30.0000 mL | Freq: Four times a day (QID) | ORAL | Status: DC | PRN

## 2011-05-02 MED ORDER — MIDAZOLAM HCL 2 MG/2ML IJ SOLN
INTRAMUSCULAR | Status: AC
  Filled 2011-05-02: qty 2

## 2011-05-02 MED ORDER — ASPIRIN EC 325 MG PO TBEC
325.0000 mg | DELAYED_RELEASE_TABLET | Freq: Every day | ORAL | Status: DC
  Filled 2011-05-02: qty 1

## 2011-05-02 MED ORDER — HEPARIN BOLUS VIA INFUSION
4000.0000 [IU] | Freq: Once | INTRAVENOUS | Status: AC
  Administered 2011-05-02: 4000 [IU] via INTRAVENOUS
  Filled 2011-05-02: qty 4000

## 2011-05-02 MED ORDER — SODIUM CHLORIDE 0.9 % IV SOLN: INTRAVENOUS | Status: AC

## 2011-05-02 MED ORDER — ATORVASTATIN CALCIUM 40 MG PO TABS
40.0000 mg | ORAL_TABLET | Freq: Every day | ORAL | Status: DC
  Administered 2011-05-02 – 2011-05-05 (×4): 40 mg via ORAL
  Filled 2011-05-02 (×4): qty 1

## 2011-05-02 MED ORDER — ONDANSETRON HCL 4 MG/2ML IJ SOLN
4.0000 mg | Freq: Four times a day (QID) | INTRAMUSCULAR | Status: DC | PRN
  Administered 2011-05-02 (×2): 4 mg via INTRAVENOUS
  Filled 2011-05-02 (×2): qty 2

## 2011-05-02 MED ORDER — HYDROMORPHONE HCL PF 1 MG/ML IJ SOLN
1.0000 mg | INTRAMUSCULAR | Status: DC | PRN
  Administered 2011-05-02: 1 mg via INTRAVENOUS
  Filled 2011-05-02: qty 1

## 2011-05-02 MED ORDER — CLONIDINE HCL 0.2 MG/24HR TD PTWK
0.2000 mg | MEDICATED_PATCH | TRANSDERMAL | Status: DC
  Administered 2011-05-02: 0.2 mg via TRANSDERMAL
  Filled 2011-05-02 (×2): qty 1

## 2011-05-02 MED ORDER — INSULIN GLARGINE 100 UNIT/ML ~~LOC~~ SOLN: 10.0000 [IU] | Freq: Every day | SUBCUTANEOUS | Status: DC

## 2011-05-02 MED ORDER — LABETALOL HCL 5 MG/ML IV SOLN
10.0000 mg | INTRAVENOUS | Status: DC | PRN
  Administered 2011-05-02: 10 mg via INTRAVENOUS
  Filled 2011-05-02 (×2): qty 4

## 2011-05-02 MED ORDER — LABETALOL HCL 5 MG/ML IV SOLN
0.5000 mg/min | INTRAVENOUS | Status: DC
  Filled 2011-05-02: qty 100

## 2011-05-02 MED ORDER — ACETAMINOPHEN 650 MG RE SUPP: 650.0000 mg | Freq: Four times a day (QID) | RECTAL | Status: DC | PRN

## 2011-05-02 MED ORDER — DOCUSATE SODIUM 100 MG PO CAPS
100.0000 mg | ORAL_CAPSULE | Freq: Two times a day (BID) | ORAL | Status: DC
  Administered 2011-05-02 – 2011-05-04 (×4): 100 mg via ORAL
  Filled 2011-05-02 (×8): qty 1

## 2011-05-02 MED ORDER — IBUPROFEN 800 MG PO TABS
800.0000 mg | ORAL_TABLET | Freq: Three times a day (TID) | ORAL | Status: DC
  Administered 2011-05-02: 800 mg via ORAL
  Filled 2011-05-02 (×5): qty 1

## 2011-05-02 MED ORDER — HEPARIN (PORCINE) IN NACL 100-0.45 UNIT/ML-% IJ SOLN
1400.0000 [IU]/h | INTRAMUSCULAR | Status: DC
  Administered 2011-05-02: 1400 [IU]/h via INTRAVENOUS
  Filled 2011-05-02: qty 250

## 2011-05-02 MED ORDER — TRAMADOL HCL 50 MG PO TABS
50.0000 mg | ORAL_TABLET | Freq: Four times a day (QID) | ORAL | Status: DC | PRN
  Administered 2011-05-02: 50 mg via ORAL
  Filled 2011-05-02: qty 1

## 2011-05-02 MED ORDER — HEPARIN (PORCINE) IN NACL 2-0.9 UNIT/ML-% IJ SOLN
INTRAMUSCULAR | Status: AC
  Filled 2011-05-02: qty 1000

## 2011-05-02 MED ORDER — POLYETHYLENE GLYCOL 3350 17 G PO PACK
17.0000 g | PACK | Freq: Every day | ORAL | Status: DC | PRN
  Filled 2011-05-02: qty 1

## 2011-05-02 MED ORDER — PANTOPRAZOLE SODIUM 40 MG PO TBEC
40.0000 mg | DELAYED_RELEASE_TABLET | Freq: Two times a day (BID) | ORAL | Status: DC
  Administered 2011-05-02: 40 mg via ORAL
  Filled 2011-05-02: qty 1

## 2011-05-02 MED ORDER — FENTANYL CITRATE 0.05 MG/ML IJ SOLN
INTRAMUSCULAR | Status: AC
  Filled 2011-05-02: qty 2

## 2011-05-02 MED ORDER — ALPRAZOLAM 0.25 MG PO TABS
0.2500 mg | ORAL_TABLET | Freq: Once | ORAL | Status: AC
  Administered 2011-05-02: 0.25 mg via ORAL
  Filled 2011-05-02: qty 1

## 2011-05-02 MED ORDER — LIDOCAINE HCL (PF) 1 % IJ SOLN
INTRAMUSCULAR | Status: AC
  Filled 2011-05-02: qty 30

## 2011-05-02 MED ORDER — ASPIRIN 81 MG PO CHEW
CHEWABLE_TABLET | ORAL | Status: AC
  Filled 2011-05-02: qty 4

## 2011-05-02 MED ORDER — FUROSEMIDE 80 MG PO TABS
80.0000 mg | ORAL_TABLET | Freq: Every day | ORAL | Status: DC
  Filled 2011-05-02 (×2): qty 1

## 2011-05-02 MED ORDER — IBUPROFEN 800 MG PO TABS
800.0000 mg | ORAL_TABLET | Freq: Once | ORAL | Status: AC
  Administered 2011-05-02: 800 mg via ORAL
  Filled 2011-05-02: qty 1

## 2011-05-02 MED ORDER — IRBESARTAN 300 MG PO TABS
300.0000 mg | ORAL_TABLET | Freq: Every day | ORAL | Status: DC
  Administered 2011-05-02 – 2011-05-05 (×4): 300 mg via ORAL
  Filled 2011-05-02 (×5): qty 1

## 2011-05-02 MED ORDER — CLONIDINE HCL 0.2 MG PO TABS
0.2000 mg | ORAL_TABLET | Freq: Two times a day (BID) | ORAL | Status: DC
  Filled 2011-05-02: qty 1

## 2011-05-02 MED ORDER — SODIUM CHLORIDE 0.9 % IJ SOLN: 3.0000 mL | Freq: Two times a day (BID) | INTRAMUSCULAR | Status: DC

## 2011-05-02 MED ORDER — VENLAFAXINE HCL ER 150 MG PO CP24
150.0000 mg | ORAL_CAPSULE | Freq: Two times a day (BID) | ORAL | Status: DC
  Administered 2011-05-02 – 2011-05-05 (×6): 150 mg via ORAL
  Filled 2011-05-02 (×8): qty 1

## 2011-05-02 MED ORDER — SENNA 8.6 MG PO TABS
1.0000 | ORAL_TABLET | Freq: Two times a day (BID) | ORAL | Status: DC
  Administered 2011-05-02 – 2011-05-04 (×4): 8.6 mg via ORAL
  Filled 2011-05-02 (×8): qty 1

## 2011-05-02 MED ORDER — SODIUM CHLORIDE 0.9 % IJ SOLN: 3.0000 mL | INTRAMUSCULAR | Status: DC | PRN

## 2011-05-02 MED ORDER — MOMETASONE FURO-FORMOTEROL FUM 100-5 MCG/ACT IN AERO
2.0000 | INHALATION_SPRAY | Freq: Two times a day (BID) | RESPIRATORY_TRACT | Status: DC
  Administered 2011-05-03 – 2011-05-05 (×4): 2 via RESPIRATORY_TRACT
  Filled 2011-05-02: qty 13

## 2011-05-02 MED ORDER — MORPHINE SULFATE 2 MG/ML IJ SOLN: 2.0000 mg | INTRAMUSCULAR | Status: DC | PRN

## 2011-05-02 MED ORDER — CARVEDILOL 25 MG PO TABS
37.5000 mg | ORAL_TABLET | Freq: Two times a day (BID) | ORAL | Status: DC
  Filled 2011-05-02 (×3): qty 1

## 2011-05-02 NOTE — Plan of Care (Signed)
Problem: Phase I Progression Outcomes Goal: Initial discharge plan identified Outcome: Progressing Home with husband

## 2011-05-02 NOTE — Progress Notes (Signed)
Inpatient Diabetes Program Recommendations  AACE/ADA: New Consensus Statement on Inpatient Glycemic Control (2009)  Target Ranges:  Prepandial:   less than 140 mg/dL      Peak postprandial:   less than 180 mg/dL (1-2 hours)      Critically ill patients:  140 - 180 mg/dL   Reason for Visit: Hyperglycemia  Inpatient Diabetes Program Recommendations Insulin - Basal: Requested pharmacy clarify Lantus dose. Insulin - Meal Coverage: Takes meal coverage at home- 15 units with breakfast, 10 units with both lunch & supper.  NPO currently, but once eating will likely benefit from regularly scheduled meal coverage Novolog in addition to correction scale. HgbA1C: Last known Hbg A1c was 8.8 on 12-15-2010.  Request updated value.  Note: Scheduled for cardiac cath. [name] S. Elsie Lincoln, RN, CNS, CDE  [phone])

## 2011-05-02 NOTE — ED Notes (Signed)
Victorino Dike RN [facility] at bedside to evaluate pt to determine if pt is stable for 2200, Verlon Au RN paged Dr. Kaylyn Layer to determine if pt would be more suitable for a stepdown unit in place of telemetry

## 2011-05-02 NOTE — Progress Notes (Signed)
ANTICOAGULATION CONSULT NOTE - Initial Consult  Pharmacy Consult for Heparin Indication: chest pain / ACS  Allergies  Allergen Reactions  . Amlodipine Other (See Comments)    constipation  . Ramipril Cough  . Adhesive (Tape)   . Codeine Other (See Comments)    Reaction unknown  . Penicillins Other (See Comments)    Reaction unknown    Patient Measurements: 98 kg  Vital Signs: Temp: 98.2 F (36.8 C) (04/30 0522) Temp src: Oral (04/30 0522) BP: 144/85 mmHg (04/30 0856) Pulse Rate: 77  (04/30 0856)  Labs:  Basename 05/02/11 0348 05/01/11 2130 05/01/11 2104  HGB 11.9* 12.6 --  HCT 35.9* 37.0 35.6*  PLT 264 -- 274  APTT -- -- --  LABPROT -- -- --  INR -- -- --  HEPARINUNFRC -- -- --  CREATININE 0.93 1.10 1.08  CKTOTAL 81 -- --  CKMB 2.9 -- --  TROPONINI <0.30 -- --   The CrCl is unknown because both a height and weight (above a minimum accepted value) are required for this calculation.  Medical History: Past Medical History  Diagnosis Date  . DM   . DYSLIPIDEMIA   . OBESITY NOS   . DEPRESSION, RECURRENT, IN PARTIAL REMISSION   . HYPERTENSION   . 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 CONGESTIVE HEART FAILURE     Cath 05/2008 with non obstructive CAD but global hypokinesis, EF 30-35%  . GERD   . DIVERTICULOSIS, COLON, WITH HEMORRHAGE   . BACK PAIN, LUMBAR, WITH RADICULOPATHY   . ABNORMAL ELECTROCARDIOGRAM   . OBSTRUCTIVE SLEEP APNEA   . Asthma     Assessment: 66 year old female with history of CAD who has not had a heart cath in 10 years.    Starting heparin for chest pain with plans for cath  Goal of Therapy:  Heparin level 0.3-0.7 units/ml   Plan:  1) Heparin 4000 units IV bolus x 1 2) Heparin drip at 1400 units / hr 3) Heparin level 6 hours after heparin starts 4) Daily heparin level, CBC  Elwin Sleight 05/02/2011,10:12 AM

## 2011-05-02 NOTE — ED Notes (Signed)
RN aware of elevated B/P. 

## 2011-05-02 NOTE — Progress Notes (Signed)
PT Cancellation Note  Treatment cancelled today due to medical issues with patient which prohibited therapy.  Patient with chest pressure and pain.  Nursing calling MD as ECG revealed BBB.  Will defer treatment today.  Will check back tomorrow.  Thanks.  INGOLD,Norita Meigs 05/02/2011, 9:13 AM  [facility] [phone] (pager)

## 2011-05-02 NOTE — Progress Notes (Signed)
TR band removed. Pt continuing to c/o R temporal pain . + N/V Called placed to Hospitalist.

## 2011-05-02 NOTE — Progress Notes (Signed)
RN, Rosanne Ashing, called NP 2/2 pt having a HA for hours and BP being up in spite of several meds/measures. Cardio did a cath on her today without intervention. She is off NTG now. Because of HA, cardio NP ordered a CT head, which was neg. NP to bedside. S: pt says her head hurts so bad and has for several hours. RN at bedside says she has had Morphine, Ibuprofen, and other meds without relief of said HA. Now, has vomited x 4 about 500cc. Had Zofran without relief. No chest pain or SOB. No blurred or double vision. No other neurological deficit symptoms. BP is down slightly per RN but back up.  O: VS reviewed. Pt is sitting up in bedside chair, rocking and holding her right head. Moaning stating she is in really bad pain. No acute distress. Appears fairly well. Resp effort is normal. RRR. S1S2. No pain with palpation of temple.  A/P: 1. HTN and HA...which causing what?  Off NTG since catheterization. Do not think Ibuprofen or Morphine are helping at this point. Dilaudid IV. For HTN, added Clonidine po, if unable to keep down, change to patch. Cont prn BP meds. Will cont to follow. 2. N/V-Phenergan. Will follow.  Get some labs-CBC w/diff, BMP, ESR. Other labs and chart notes reviewed.  Maren Reamer, NP [facility]

## 2011-05-02 NOTE — Telephone Encounter (Signed)
[facility] Triage Call Report Triage Record Num: [id number] Operator: Elita Boone Patient Name: [name] Call Date & Time: 05/01/2011 6:50:11PM Patient Phone: [phone] PCP: [place] Patient Gender: Female PCP Fax : [phone] Patient DOB: Oct 01, 1945 Practice Name: [facility] Reason for Call: Caller: [name]; PCP: [name]; CB#: [phone]; Call regarding Hands and Arms Swelling ,nausea, Side Effect of Benicar onset last week. Pt reports that she is short of breath. Pt reports that she was seen in office this am. Pt reports that chest feels tight. Pt instructed to be seen ED for shortness of breath and tightness in chest. Protocol(s) Used: Breathing Problems Recommended Outcome per Protocol: See ED Immediately Reason for Outcome: Sudden onset of shortness of breath, difficulty breathing, chest pain OR cough with blood tinged sputum. Care Advice: ~ Another adult should drive. ~ Place person in a position of comfort and loosen tight clothing. 05/01/2011 6:59:07PM Page 1 of 1 [email or web address]

## 2011-05-02 NOTE — Progress Notes (Signed)
Pt. B/P 196/100 manually, all other VS stable, pt asymptomatic. MD on call was on the floor and was notified. Gave orders to increase morphine for pain and give to patient and also give dose of coreg now. Morphine was given, coreg will be given when sent up from the pharmacy. Will continue to monitor.

## 2011-05-02 NOTE — Progress Notes (Signed)
Notified MD re: Pt c/o chest pressure on L side under breast with nausea and states she feels very short of breath., VS taken, see doc flowsheet, 2 L 02 applied, EKG completed and  MD notified. Will continue to monitor. 05/02/2011 8:57 AM Orders Received: Cardiology consult, nitroglycerin iv started, 324 baby asa given Christell Constant, Alene Mires

## 2011-05-02 NOTE — Progress Notes (Signed)
S:  CTSP 2/2 headache and ongoing hypertension.  Pt has received prn tylenol, Morphine, ultram, and ibuprofen w/o significant change in headache.  BP's have been trending > 170.  She denies chest pain.  She says that her headache is right sided and is the worst she's ever had.  O:   Filed Vitals:   05/02/11 1701  BP: 200/99  Pulse: 70  Temp: 98.1 F (36.7 C)  Resp: 20   Pleasant.  Tearful.  C/O headache as above.  AAOx3. Neuro: grossly intact, nonfocal.  Normal strength bilat. Lungs: CTA.  Cardiac: RRR.  Abd: soft, nt, nd, bs+x4. Ext:  No cce.  R wrist cath site with TR band in place (deflated).  No bleeding, bruit, hematoma.  R radial 2 +.  A/P:  1.  Headache:  Pt c/o severe headache despite multiple pain meds.  Will obtain STAT head CT.  Continue pain mgmt attempts.  Neuro exam is normal.  2.  HTN:  ? Contribution to # 1 vs. Result of #1.  She's received her usual meds along with labetolol 10mg  IV.  Will try hydralazine IV.  Nicolasa Ducking, NP [phone]

## 2011-05-02 NOTE — H&P (Signed)
PCP:  [name], MD, MD  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   Pt was seen by [name] on 4/18, and at that time had no cardiovascular complaints. Overall  his plans were risk reduction. Review of EPIC shows 05/2008 cath with one single posterolateral  vessel with 80% stenosis, otherwise everything else was < 30%. However, she had global hypokinesis  with EF 30-35%.   She now comes back saying that yesterday she developed sudden onset left chest, under breast  discomfort described as a "pulling" sensation, worse when she leans back and tries to lay down.  There is no radiation to other parts of her body, but there is associated nausea, no diaphoresis  or dizziness. It's not clearly exertional. She also feels abdominal distention and feels like  something pressing up against her chest from her abdomen, has been feeling constipated, for which  she took stool softener and had a BM but doesn't feel like it got everything. She felt gas pain  when she had her last MI though. The pain is worse when I press on her chest.   In the ED pt was hypertense up to 211/96. Labs with normal chem panel, hyperglycemia up to 214.  BNP elevated to 1119, Trop POC negative. CBC normal. CXR with enlarged heart, right basilar  atelectasis.   ROS as above otherwise with some sweats, and also has been getting worked up for a lump in her  left arm.     Past Medical History  Diagnosis Date  . DM   . DYSLIPIDEMIA   . OBESITY NOS   . DEPRESSION, RECURRENT, IN PARTIAL REMISSION   . HYPERTENSION   . 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 CONGESTIVE HEART FAILURE     Cath 05/2008 with non obstructive CAD but global  hypokinesis, EF 30-35%  . GERD   . DIVERTICULOSIS, COLON, WITH HEMORRHAGE   . BACK PAIN, LUMBAR, WITH RADICULOPATHY   . ABNORMAL ELECTROCARDIOGRAM   . OBSTRUCTIVE SLEEP APNEA   . Asthma     Past Surgical History  Procedure Date  . Uvuloplasty   . Total abdominal hysterectomy w/ bilateral salpingoophorectomy   . Abdominal hysterectomy   . Cholecystectomy     Medications:  HOME MEDS: Prior to Admission medications   Medication Sig Start Date End Date Taking? Authorizing Provider  aspirin 81 MG tablet Take 81 mg by mouth daily.     Yes Historical Provider, MD  atorvastatin (LIPITOR) 40 MG tablet Take 1 tablet (40 mg total) by mouth daily. 01/30/11 01/30/12 Yes [name], MD  bisacodyl (DULCOLAX) 5 MG EC tablet Take 5 mg by mouth daily as needed. For constipation   Yes Historical Provider, MD  carvedilol (COREG) 25 MG tablet Take 37.5 mg by mouth 2 (two) times daily with a meal. Take one and one half tablet twice daily. PT WANTS BRAND ONLY 04/20/11  Yes [name], MD  furosemide (LASIX) 80 MG tablet Take 80 mg by mouth daily. 04/20/11  Yes [name], MD  insulin aspart (NOVOLOG) 100 UNIT/ML injection Inject 10-15 Units into the skin 3 (three) times daily before meals. 15 units at breakfast 10units at  lunch 10 units at dinner   Yes Historical Provider, MD  insulin glargine (LANTUS) 100 UNIT/ML injection Inject 10-20 Units into the skin at bedtime. Per sliding scale   Yes Historical Provider, MD  mometasone-formoterol (DULERA) 100-5 MCG/ACT AERO Inhale 2 puffs into the lungs 2 (two) times daily. 03/30/11  Yes [name], MD  olmesartan (BENICAR) 40 MG tablet Take 40 mg by mouth daily. 03/27/11 03/26/12 Yes [name], MD  pantoprazole (PROTONIX) 40 MG tablet Take 40 mg by mouth 2 (two) times daily. 04/26/11  Yes [name], MD  Polyethylene Glycol 3350 (MIRALAX PO) Take 17 g by mouth daily. As directed    Yes Historical Provider, MD  potassium chloride (MICRO-K) 10 MEQ  CR capsule Take 10 mEq by mouth daily.     Yes Historical Provider, MD  SitaGLIPtin-MetFORMIN HCl 50-1000 MG TB24 Take 1 tablet by mouth daily. 10/20/10  Yes [name], MD  venlafaxine (EFFEXOR XR) 150 MG 24 hr capsule Take 150 mg by mouth 2 (two) times daily.     Yes Historical Provider, MD    Allergies:  Allergies  Allergen Reactions  . Amlodipine Other (See Comments)    constipation  . Ramipril Cough  . Adhesive (Tape)   . Codeine Other (See Comments)    Reaction unknown  . Penicillins Other (See Comments)    Reaction unknown    Social History:   reports that she quit smoking about 4 years ago. Her smoking use included Cigarettes. She has a 20 pack-year smoking history. She has never used smokeless tobacco. She reports that she does not drink alcohol or use illicit drugs.  Family History: Family History  Problem Relation Age of Onset  . Stroke Mother   . Breast cancer Sister   . Emphysema Sister   . Cancer Sister     breast  . Alcohol abuse Brother   . Colon cancer      uncles x 2  . Prostate cancer Brother     Physical Exam: Filed Vitals:   05/02/11 0230 05/02/11 0245 05/02/11 0300 05/02/11 0315  BP: 205/92 200/100 175/94 194/96  Pulse: 88 91 89 89  Temp:      TempSrc:      Resp: 23 16 16 22   SpO2: 87% 97% 97% 100%   Blood pressure 194/96, pulse 89, temperature 98.7 F (37.1 C), temperature source Oral, resp. rate 22, SpO2 100.00%. Gen: Very obese, very uncomfortable appearing F, appears on the verge of tears, able to relate  history well though, is pleasant, breathing comfortably.  HEENT: Pupils round, equal, EOMI, sclera clear. Mouth moist, normal Lungs: CTAB no w/c/r, good air movement Heart: Regular S1/2, no m/g appreciated, very tender to palpation in midclavicular line under left  breast, she jumps and yelps. I do not appreciate any soft tissue masses. Abd: Soft, a bit distended, with some facial grimacing and subjective TTP diffusely, but not    peritoneal or rigid Extrem: warm, perfusing well, radials a bit hard to palpate, no BLE edema noted. Left elbow  anteriorly is a soft tissue mass, not mobile, not tender.  Neuro: Alert, attentive, conversant, CN 2-12 intact, moves extremities on her own, able to move  about in the bed on her own, grossly non-focal.    Labs & Imaging Results for orders placed during the hospital encounter of 05/01/11 (from the past 48 hour(s))  CBC     Status: Abnormal   Collection Time   05/01/11  9:04 PM  Component Value Range Comment   WBC 8.5  4.0 - 10.5 (K/uL)    RBC 4.11  3.87 - 5.11 (MIL/uL)    Hemoglobin 11.7 (*) 12.0 - 15.0 (g/dL)    HCT 16.1 (*) 09.6 - 46.0 (%)    MCV 86.6  78.0 - 100.0 (fL)    MCH 28.5  26.0 - 34.0 (pg)    MCHC 32.9  30.0 - 36.0 (g/dL)    RDW 04.5 (*) 40.9 - 15.5 (%)    Platelets 274  150 - 400 (K/uL)   BASIC METABOLIC PANEL     Status: Abnormal   Collection Time   05/01/11  9:04 PM      Component Value Range Comment   Sodium 135  135 - 145 (mEq/L)    Potassium 3.8  3.5 - 5.1 (mEq/L)    Chloride 94 (*) 96 - 112 (mEq/L)    CO2 30  19 - 32 (mEq/L)    Glucose, Bld 204 (*) 70 - 99 (mg/dL)    BUN 14  6 - 23 (mg/dL)    Creatinine, Ser 8.11  0.50 - 1.10 (mg/dL)    Calcium 9.6  8.4 - 10.5 (mg/dL)    GFR calc non Af Amer 52 (*) >90 (mL/min)    GFR calc Af Amer 60 (*) >90 (mL/min)   PRO B NATRIURETIC PEPTIDE     Status: Abnormal   Collection Time   05/01/11  9:06 PM      Component Value Range Comment   Pro B Natriuretic peptide (BNP) 1119.0 (*) 0 - 125 (pg/mL)   POCT I-STAT TROPONIN I     Status: Normal   Collection Time   05/01/11  9:29 PM      Component Value Range Comment   Troponin i, poc 0.02  0.00 - 0.08 (ng/mL)    Comment 3            POCT I-STAT, CHEM 8     Status: Abnormal   Collection Time   05/01/11  9:30 PM      Component Value Range Comment   Sodium 139  135 - 145 (mEq/L)    Potassium 3.8  3.5 - 5.1 (mEq/L)    Chloride 97  96 - 112 (mEq/L)    BUN 16   6 - 23 (mg/dL)    Creatinine, Ser 9.14  0.50 - 1.10 (mg/dL)    Glucose, Bld 782 (*) 70 - 99 (mg/dL)    Calcium, Ion 9.56  1.12 - 1.32 (mmol/L)    TCO2 32  0 - 100 (mmol/L)    Hemoglobin 12.6  12.0 - 15.0 (g/dL)    HCT 21.3  08.6 - 57.8 (%)    Dg Chest 2 View  05/01/2011  *RADIOLOGY REPORT*  Clinical Data: Left side chest tightness and pain for 2 days, history diabetes, hypertension, coronary artery disease, CHF  CHEST - 2 VIEW  Comparison: 03/30/2011  Findings: Enlargement of cardiac silhouette. Tortuous aorta. Pulmonary vascularity normal. Right basilar atelectasis. Lungs otherwise clear. No pleural effusion or pneumothorax. Diffuse osseous demineralization.  IMPRESSION: Enlargement of cardiac silhouette. Right basilar atelectasis.  Original Report Authenticated By: Lollie Marrow, M.D.    ECG: NSR 94 bpm, normal axis, prolonged PR such that the P wave is getting superimposed on the T  wave. This is most apparent at the end of the rhythm strip. Apparent inferior Q waves. No frank ST  deviations, but there is downward sloping of the ST segments inferiorly, with possible TWI.  Possibly QTc prolongation.   Prior ECG 02/2011: PR prolongation to 328 msec. Inferior Q waves noted. Very diffusely flat T  waves.        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   1. Chest pain: She has obvious RF's of prior MI and stenting but her history is atypical (left  costochondral tenderness to palpation and other atypical features) and there is no definite  evidence of MI with negative Trop x1. ECG is hard to interpret as both her PR and QTc are  prolonged, but there may be TW inversion inferiorly, again hard  to tell. There are no frank ST  deviations though. Therefore reasonable DDx includes ACS, pericarditis, musculoskeletal, GI  (constipation vs GERD)  - Trend enzymes and ECG, ASA 325, hold on full ACS at present. Ibuprofren for MSK. Abdominal plain  film.  - Repeat echo to evaluate both WMA but also ? EF 30-35% seen on last cath.  - If above w/u negative and pt still very symptomatic, consider CT her chest, because she has been  getting worked up for these arm masses, and she may in fact have structural lesion in her chest  (sweating at night, ? lymphoma?)  - Continue coreg, lasix, benicar, statin - Aggressive BM regimen   2. DM: Hold home PO med, will give SSI 3. HTN: Continue home regimen, pain control, and IV labetalol.   SubQ heparin Telemetry, [facility] team 5 Presumed full code    Other plans as per orders.    Kalayah Leske 05/02/2011, 3:39 AM

## 2011-05-02 NOTE — Progress Notes (Signed)
Patient seen and examined this morning, admitted by Dr. Carlota Raspberry. 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 - At the time of my initial encounter, patient chest pain was improving however I was called later in few minutes that patient again started having chest pain. - At this time I call cardiology consultation given that the patient had new bundle branch block with active chest pain. -  Patient has multiple cardiac risk factors with a history of coronary artery disease we'll need further risk stratification with cardiac cath, patient was started on heparin and IV nitroglycerin per cardiology recommendations.   [name] M.D. [facility] 05/02/2011, 1:46 PM  Pager: [phone]

## 2011-05-02 NOTE — Consult Note (Addendum)
CARDIOLOGY CONSULT NOTE   Patient ID: [name] MRN: [id number] DOB/AGE: 08-14-1945 66 y.o.  Admit date: 05/01/2011  Primary Physician   [name], MD, MD Primary Cardiologist   [facility] Reason for Consultation   Chest pain  NWG:[name] is a 66 y.o. female with a history of CAD.   Pt began experiencing chest pain about 2 weeks. The episodes were occurring with exertion. She was getting them for 5 times a week. They were relieved by rest and she also took Tylenol but no other medications. She felt that she could neurologically bring them on with exertion. She also had some chest pain that started at rest. The worst episode was a 9/10. There was some associated shortness of breath but no nausea, vomiting or diaphoresis. The pain did not radiate. Over the last few days, her symptoms have progressed. She had more episodes of chest pain at rest. Last p.m., she developed chest pain that did not resolve. She came to the hospital and her chest pain improved from a 10/10 to a 3/10 with aspirin and nitroglycerin. She also received some morphine. However, her chest pain never completely resolved. This a.m., her chest pain increased again to greater than 7/10. She received her IV nitroglycerin and her chest pain has improved although she does have a slight headache. Her initial ECG was possibly atrial flutter versus atrial tachycardia and she spontaneously converted to second degree AV block Mobitz 1. She is not aware that her heart rate is slightly irregular. She denies any syncope or presyncope. Currently, her chest pain is approximately 5/10 and she is not having any associated symptoms.   Past Medical History  Diagnosis Date  . DM   . DYSLIPIDEMIA   . OBESITY NOS   . DEPRESSION, RECURRENT, IN PARTIAL REMISSION   . HYPERTENSION   . 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 CONGESTIVE HEART FAILURE     Cath 05/2008 with non obstructive CAD but  global hypokinesis, EF 30-35%  . GERD   . DIVERTICULOSIS, COLON, WITH HEMORRHAGE   . BACK PAIN, LUMBAR, WITH RADICULOPATHY   . ABNORMAL ELECTROCARDIOGRAM   . OBSTRUCTIVE SLEEP APNEA   . Asthma      Past Surgical History  Procedure Date  . Uvuloplasty   . Total abdominal hysterectomy w/ bilateral salpingoophorectomy   . Abdominal hysterectomy   . Cholecystectomy     Allergies  Allergen Reactions  . Amlodipine Other (See Comments)    constipation  . Ramipril Cough  . Adhesive (Tape)   . Codeine Other (See Comments)    Reaction unknown  . Penicillins Other (See Comments)    Reaction unknown    I have reviewed the patient's current medications    . aspirin      . aspirin  324 mg Oral Once  . aspirin  324 mg Oral Once  . aspirin EC  325 mg Oral Daily  . atorvastatin  40 mg Oral Daily  . carvedilol  37.5 mg Oral BID WC  . docusate sodium  100 mg Oral BID  . furosemide  80 mg Oral Daily  . heparin  5,000 Units Subcutaneous Q8H  . ibuprofen  800 mg Oral TID  . ibuprofen  800 mg Oral Once  . insulin aspart  0-20 Units Subcutaneous TID WC  . insulin glargine  10-20 Units Subcutaneous QHS  . irbesartan  300 mg Oral Daily  .  labetalol  10 mg Intravenous Once  . mometasone-formoterol  2 puff Inhalation BID  . ondansetron (ZOFRAN) IV  4 mg Intravenous Once  . pantoprazole  40 mg Oral BID  . senna  1 tablet Oral BID  . sodium chloride  3 mL Intravenous Q12H  . sodium chloride  3 mL Intravenous Q12H  . venlafaxine XR  150 mg Oral BID  . DISCONTD: aspirin EC  325 mg Oral Daily  . DISCONTD: carvedilol  37.5 mg Oral BID WC      . nitroGLYCERIN 10 mcg/min (05/02/11 0848)   sodium chloride, acetaminophen, acetaminophen, alum & mag hydroxide-simeth, bisacodyl, labetalol, morphine, ondansetron, polyethylene glycol, sodium chloride, sodium phosphate, DISCONTD:  morphine injection, DISCONTD: morphine, DISCONTD: nitroGLYCERIN  Prescriptions prior to admission  Medication Sig  Dispense Refill  . aspirin 81 MG tablet Take 81 mg by mouth daily.        [name] atorvastatin (LIPITOR) 40 MG tablet Take 1 tablet (40 mg total) by mouth daily.  90 tablet  3  . bisacodyl (DULCOLAX) 5 MG EC tablet Take 5 mg by mouth daily as needed. For constipation      . carvedilol (COREG) 25 MG tablet Take 37.5 mg by mouth 2 (two) times daily with a meal. Take one and one half tablet twice daily. PT WANTS BRAND ONLY      . furosemide (LASIX) 80 MG tablet Take 80 mg by mouth daily.      . insulin aspart (NOVOLOG) 100 UNIT/ML injection Inject 10-15 Units into the skin 3 (three) times daily before meals. 15 units at breakfast 10units at lunch 10 units at dinner      . insulin glargine (LANTUS) 100 UNIT/ML injection Inject 10-20 Units into the skin at bedtime. Per sliding scale      . mometasone-formoterol (DULERA) 100-5 MCG/ACT AERO Inhale 2 puffs into the lungs 2 (two) times daily.      [name] olmesartan (BENICAR) 40 MG tablet Take 40 mg by mouth daily.      . pantoprazole (PROTONIX) 40 MG tablet Take 40 mg by mouth 2 (two) times daily.      . Polyethylene Glycol 3350 (MIRALAX PO) Take 17 g by mouth daily. As directed       . potassium chloride (MICRO-K) 10 MEQ CR capsule Take 10 mEq by mouth daily.        . SitaGLIPtin-MetFORMIN HCl 50-1000 MG TB24 Take 1 tablet by mouth daily.  112 tablet  0  . venlafaxine (EFFEXOR XR) 150 MG 24 hr capsule Take 150 mg by mouth 2 (two) times daily.            History   Social History  . Marital Status: Married    Spouse Name: N/A    Number of Children: 0  . Years of Education: N/A   Occupational History  .     Social History Main Topics  . Smoking status: Former Smoker -- 1.0 packs/day for 20 years    Types: Cigarettes    Quit date: 01/03/2007  . Smokeless tobacco: Never Used  . Alcohol Use: No  . Drug Use: No  . Sexually Active: Not Currently    Birth Control/ Protection: Surgical   Other Topics Concern  . Not on file   Social History Narrative    . No narrative on file     Family History  Problem Relation Age of Onset  . Stroke Mother   . Breast cancer Sister   . Emphysema Sister   .  Cancer Sister     breast  . Alcohol abuse Brother   . Colon cancer      uncles x 2  . Prostate cancer Brother      ROS: She has asthma and her respiratory problems are worse because of the pollen. However, she has noticed increasing dyspnea on exertion but denies wheezing or significant cough. She has had no recent illnesses, fevers or chills. She feels that her CPAP is not working that well but she is compliant with it. Upon further questioning, she was waking with chest pain and she was reassured that the CPAP did not cause this. She denies melena or reflux symptoms. She has occasional arthralgias. Full 14 point review of systems complete and found to be negative unless listed above.  Physical Exam: Blood pressure 144/85, pulse 77, temperature 98.2 F (36.8 C), temperature source Oral, resp. rate 18, SpO2 99.00%.  General: Well developed, well nourished, female in no acute distress Head: Eyes PERRLA, No xanthomas.   Normocephalic and atraumatic, oropharynx without edema or exudate. Dentition good Lungs: Bilateral clear sounds with a few basilar rales Heart: Heart slightly irregular rate and rhythm with S1, S2  murmur. pulses are 2+ all 4 extrem.   Neck: No carotid bruit. No lymphadenopathy.  JVD not elevated. Abdomen: Bowel sounds present, abdomen soft and non-tender without masses or hernias noted. Msk:  No spine or cva tenderness. No weakness, no joint deformities or effusions. Extremities: No clubbing or cyanosis. No edema.  Neuro: Alert and oriented X 3. No focal deficits noted. Psych:  Good affect, responds appropriately Skin: No rashes or lesions noted.  Labs:   Lab Results  Component Value Date   WBC 7.5 05/02/2011   HGB 11.9* 05/02/2011   HCT 35.9* 05/02/2011   MCV 85.9 05/02/2011   PLT 264 05/02/2011   No results found for this  basename: INR in the last 72 hours  Lab 05/02/11 0348  NA 140  K 4.2  CL 98  CO2 31  BUN 12  CREATININE 0.93  CALCIUM 9.6  PROT --  BILITOT --  ALKPHOS --  ALT --  AST --  GLUCOSE 210*   Magnesium  Date Value Range Status  09/18/2010 2.1  1.5-2.5 (mg/dL) Final    Basename 16/10/96 0348  CKTOTAL 81  CKMB 2.9  TROPONINI <0.30    Basename 05/01/11 2129  TROPIPOC 0.02   Pro B Natriuretic peptide (BNP)  Date/Time Value Range Status  05/01/2011  9:06 PM 1119.0* 0-125 (pg/mL) Final  12/15/2010  2:43 PM 96.0  0.0-100.0 (pg/mL) Final   Lab Results  Component Value Date   CHOL 152 09/18/2010   HDL 60 09/18/2010   LDLCALC 78 09/18/2010   TRIG 71 09/18/2010    Cardiac Cath: 09/06/2001 The LAD had proximal 25% stenosis. The distal vessel had 99% stenosis and  distal diffuse disease.  The circumflex in the AV groove had luminal irregularities. A posterolateral  had diffuse distal disease in a small branch vessel.  The right coronary artery was a very large dominant vessel. There were  proximal and distal stents which were patent with only minor in-stent  luminal irregularities.  LEFT VENTRICULOGRAM: The left ventriculogram was obtained in the RAO  projection. The EF was 40% with moderate inferior akinesis.  DISTAL AORTOGRAM: A distal aortogram was obtained secondary to her history  of difficult to control hypertension and questionable renal artery stenosis.  These were patent with only luminal irregularities. There was mild plaquing  in  the distal AO.  CONCLUSION: Small vessel coronary disease in the distal left anterior  descending and circumflex branch vessels. Mild to moderate reduction in left  ventricular function.  ECG:  02-May-2011 07:11:00 Critical Test Result: AV Block Sinus rhythm with 2nd degree A-V block (Mobitz I) Cannot rule out Anterior infarct , age undetermined Vent. rate 63 BPM PR interval * ms QRS duration 88 ms QT/QTc 468/478 ms P-R-T axes 55 65  30  01-May-2011 19:27:25  Undetermined rhythm (?atrial flutter?) Cannot rule out Anterior infarct , age undetermined Prolonged QT Abnormal ECG Vent. rate 94 BPM PR interval * ms QRS duration 86 ms QT/QTc 414/517 ms P-R-T axes * 75 54  04-Feb-2011 01:07:08  SINUS RHYTHM ~ normal P axis, V-rate 50- 99 FIRST DEGREE AV BLOCK ~ PR >220, V-rate 50- 90 BORDERLINE INFERIOR Q WAVES ~ Qs add to 80 mS in II III aVF BORDERLINE T WAVE ABNORMALITIES ~ T/QRS ratio < 1/20 or flat T BASELINE WANDER IN LEAD(S) I,II,aVR Vent. rate 71 BPM PR interval 328 ms QRS duration 90 ms QT/QTc 371/403 ms P-R-T axes 48 56 27  Radiology:  Dg Chest 2 View 05/01/2011  *RADIOLOGY REPORT*  Clinical Data: Left side chest tightness and pain for 2 days, history diabetes, hypertension, coronary artery disease, CHF  CHEST - 2 VIEW  Comparison: 03/30/2011  Findings: Enlargement of cardiac silhouette. Tortuous aorta. Pulmonary vascularity normal. Right basilar atelectasis. Lungs otherwise clear. No pleural effusion or pneumothorax. Diffuse osseous demineralization.  IMPRESSION: Enlargement of cardiac silhouette. Right basilar atelectasis.  Original Report Authenticated By: Lollie Marrow, M.D.   Dg Hip Complete Right 04/10/2011  *RADIOLOGY REPORT*  Clinical Data: Right hip pain  RIGHT HIP - COMPLETE 2+ VIEW  Comparison: CT abdomen pelvis 09/17/2010  Findings: Right femoral head is located in the right acetabulum. No acute bony abnormalities identified.  There is slight subchondral sclerosis of both hips and slight joint space narrowing of the left hip.  No definite/significant joint space narrowing of the right hip appreciated.  There is sclerosis about both sacroiliac joints, along the mid and inferior aspects.  IMPRESSION:  1.  Sclerosis about both sacroiliac joints suggests bilateral sacroiliitis.  Question if this could be the cause of the patient's pain. 2.  Slight degenerative changes of the hips, left greater than right, not  unexpected for patient age.  Original Report Authenticated By: Britta Mccreedy, M.D.   Dg Abd 1 View 05/02/2011  *RADIOLOGY REPORT*  Clinical Data: Left lower quadrant abdominal pain  ABDOMEN - 1 VIEW  Comparison: 02/04/2011  Findings: Nonobstructive bowel gas pattern.  Bilateral hip and lower lumbar degenerative changes.  Surgical clips right upper quadrant.  Atherosclerotic vascular calcification.  IMPRESSION: Nonobstructive bowel gas pattern.  Original Report Authenticated By: Waneta Martins, M.D.    ASSESSMENT AND PLAN:   The patient was seen today by Dr Ladona Ridgel, the patient evaluated and the data reviewed.  Principal Problem:  *Chest pain - her symptoms are concerning for progressive anginal pain. She has a history of coronary artery disease and has not had a heart catheterization in 10 years. The risks and benefits of cardiac catheterization were discussed with the patient. She indicates understanding and agrees to proceed. Will add heparin and increase IV NTG.   Active Problems:  DM  HYPERTENSION  CORONARY ARTERY DISEASE  CONGESTIVE HEART FAILURE   Signed: Theodore Demark 05/02/2011, 9:05 AM Co-Sign MD  Cardiology Attending  Patient seen and examined independently. She has multiple cardiac risk factors and  recurrent exertional pain which has progressed. I have recommended proceeding with left heart cath. Her last cath 2010 with non-obstructive disease and patient stents.  [name], M.D.

## 2011-05-02 NOTE — CV Procedure (Signed)
    Cardiac Catheterization Operative Report  [name] [id number] 4/30/20131:36 PM [name], MD, MD  Procedure Performed:  1. Left Heart Catheterization 2. Selective Coronary Angiography 3. Left ventricular angiogram  Operator: Verne Carrow, MD  Arterial access site:  Right radial artery.   Indication:   Chest pain in pt with known CAD. Negative cardiac enzymes.                                     Procedure Details: The risks, benefits, complications, treatment options, and expected outcomes were discussed with the patient. The patient and/or family concurred with the proposed plan, giving informed consent. The patient was brought to the cath lab after IV hydration was begun and oral premedication was given. The patient was further sedated with Versed and Fentanyl. The right wrist was assessed with an Allens test which was positive. The right wrist was prepped and draped in a sterile fashion. 1% lidocaine was used for local anesthesia. Using the modified Seldinger access technique, a 5 French sheath was placed in the right radial artery. 1.25 mg Nicardipine was given through the sheath. 5000 units IV heparin was given. Standard diagnostic catheters were used to perform selective coronary angiography. A pigtail catheter was used to perform a left ventricular angiogram. The sheath was removed from the right radial artery and a hemostasis band was applied at the arteriotomy site on the right wrist.    There were no immediate complications. The patient was taken to the recovery area in stable condition.   Hemodynamic Findings: Central aortic pressure: 158/81 Left ventricular pressure: 157/1/7  Angiographic Findings:  Left main:  20% plaque.   Left Anterior Descending Artery: Large caliber vessel that courses to the apex. The proximal and mid vessel has mild plaque disease. The distal vessel becomes very small in caliber and has diffuse disease. There appears to be a  focal 99% stenosis but this is in the very distal, smaller segment as it approaches the apex. This is too small for PCI. Unchanged from cath in 2010.   Circumflex Artery: Moderate sized vessel with mild plaque in mid vessel.   Right Coronary Artery: Large dominant vessel with patent stents in the mid and distal vessel. There is a 30% stenosis in the proximal vessel just before the stent. The mid stent is patent with mild restenosis. The distal stent is patent with mild in stent restenosis. The PL branch and PDA are small to moderate sized, long branches with mild plaque disease.   Left Ventricular Angiogram: LVEF=35%.   Impression: 1. 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  2. Moderate global LV systolic dysfunction.    Recommendations: Continue medical management.        Complications:  None. The patient tolerated the procedure well.

## 2011-05-02 NOTE — Interval H&P Note (Signed)
History and Physical Interval Note:  05/02/2011 1:10 PM  [name]  has presented today for surgery, with the diagnosis of chest pain  The various methods of treatment have been discussed with the patient and family. After consideration of risks, benefits and other options for treatment, the patient has consented to  Procedure(s) (LRB): LEFT HEART CATHETERIZATION WITH CORONARY ANGIOGRAM (N/A) as a surgical intervention .  The patients' history has been reviewed, patient examined, no change in status, stable for surgery.  I have reviewed the patients' chart and labs.  Questions were answered to the patient's satisfaction.     Kristof Nadeem

## 2011-05-02 NOTE — Progress Notes (Signed)
Call placed to Hospitalist re: persistent hypertension despite medications given. Pt presently asleep. No further n/v after receiving Phenergan 12.5mg  IV as ordered.

## 2011-05-02 NOTE — Plan of Care (Signed)
Problem: Consults Goal: Cardiac Cath Patient Education (See Patient Education module for education specifics.) Outcome: Completed/Met Date Met:  05/02/11 Declined video

## 2011-05-02 NOTE — ED Notes (Signed)
Pt reports she has not taken her Benicar since Sunday am, states "I don't want to take that anymore, too many side effects." Pt also reports not taking her evening dose of Coreg

## 2011-05-02 NOTE — ED Notes (Signed)
Patient transported to CT 

## 2011-05-02 NOTE — Progress Notes (Signed)
Utilization Review Completed.Tyronn Golda T4/30/2013   

## 2011-05-02 NOTE — Progress Notes (Signed)
Report given to [employer]. Pt transferred to 2900 via bed on 02 and monitor for Uncontrolled Hypertension. B/p down to 186/106 94 20 sats 96% on 2L

## 2011-05-02 NOTE — Progress Notes (Signed)
Patient ID: [name], female   DOB: 1945-05-02, 66 y.o.   MRN: [id number]  [name] sleeping after Dilaudid and Phenergan, but BP remains high. This NP called [facility] and spoke to Dr. Tyson Alias. Will transfer pt to 2900 under ICU status and start continuous gtt to control BP. Dr. Herma Carson, PCCM, to consult pt when back in building. [facility] will camera in room when transferred to 2900. Have given pt more IV prn dose of Labetalol in meantime and she is now on Catapres patch.  Maren Reamer, NP [facility]

## 2011-05-03 ENCOUNTER — Encounter: Payer: Medicare Other | Admitting: Rehabilitative and Restorative Service Providers"

## 2011-05-03 DIAGNOSIS — R7309 Other abnormal glucose: Secondary | ICD-10-CM

## 2011-05-03 DIAGNOSIS — I161 Hypertensive emergency: Secondary | ICD-10-CM | POA: Diagnosis present

## 2011-05-03 DIAGNOSIS — R739 Hyperglycemia, unspecified: Secondary | ICD-10-CM | POA: Diagnosis present

## 2011-05-03 DIAGNOSIS — I517 Cardiomegaly: Secondary | ICD-10-CM

## 2011-05-03 DIAGNOSIS — G9340 Encephalopathy, unspecified: Secondary | ICD-10-CM | POA: Diagnosis not present

## 2011-05-03 DIAGNOSIS — G4733 Obstructive sleep apnea (adult) (pediatric): Secondary | ICD-10-CM

## 2011-05-03 DIAGNOSIS — I2510 Atherosclerotic heart disease of native coronary artery without angina pectoris: Secondary | ICD-10-CM

## 2011-05-03 LAB — CBC
HCT: 38.9 % (ref 36.0–46.0)
Hemoglobin: 12.8 g/dL (ref 12.0–15.0)
MCH: 28.3 pg (ref 26.0–34.0)
RBC: 4.52 MIL/uL (ref 3.87–5.11)

## 2011-05-03 LAB — GLUCOSE, CAPILLARY
Glucose-Capillary: 173 mg/dL — ABNORMAL HIGH (ref 70–99)
Glucose-Capillary: 156 mg/dL — ABNORMAL HIGH (ref 70–99)
Glucose-Capillary: 125 mg/dL — ABNORMAL HIGH (ref 70–99)
Glucose-Capillary: 148 mg/dL — ABNORMAL HIGH (ref 70–99)

## 2011-05-03 LAB — MRSA PCR SCREENING: MRSA by PCR: NEGATIVE

## 2011-05-03 MED ORDER — SODIUM CHLORIDE 0.9 % IV SOLN
INTRAVENOUS | Status: DC
  Administered 2011-05-03: 18:00:00 via INTRAVENOUS
  Administered 2011-05-03: 5.4 [IU]/h via INTRAVENOUS
  Filled 2011-05-03 (×3): qty 1

## 2011-05-03 MED ORDER — PANTOPRAZOLE SODIUM 40 MG IV SOLR
40.0000 mg | INTRAVENOUS | Status: DC
  Administered 2011-05-03: 40 mg via INTRAVENOUS
  Filled 2011-05-03 (×2): qty 40

## 2011-05-03 MED ORDER — LABETALOL HCL 100 MG PO TABS
100.0000 mg | ORAL_TABLET | Freq: Three times a day (TID) | ORAL | Status: DC
  Administered 2011-05-03 (×3): 100 mg via ORAL
  Filled 2011-05-03 (×6): qty 1

## 2011-05-03 MED ORDER — FUROSEMIDE 10 MG/ML IJ SOLN
20.0000 mg | Freq: Three times a day (TID) | INTRAMUSCULAR | Status: AC
  Administered 2011-05-03 (×2): 20 mg via INTRAVENOUS
  Filled 2011-05-03 (×2): qty 2

## 2011-05-03 MED ORDER — INSULIN ASPART 100 UNIT/ML ~~LOC~~ SOLN: 0.0000 [IU] | Freq: Every day | SUBCUTANEOUS | Status: DC

## 2011-05-03 MED ORDER — INSULIN GLARGINE 100 UNIT/ML ~~LOC~~ SOLN
5.0000 [IU] | Freq: Two times a day (BID) | SUBCUTANEOUS | Status: DC
  Administered 2011-05-03: 5 [IU] via SUBCUTANEOUS

## 2011-05-03 MED ORDER — PANTOPRAZOLE SODIUM 40 MG PO TBEC
40.0000 mg | DELAYED_RELEASE_TABLET | Freq: Every day | ORAL | Status: DC
  Administered 2011-05-04 – 2011-05-05 (×2): 40 mg via ORAL
  Filled 2011-05-03 (×2): qty 1

## 2011-05-03 MED ORDER — INSULIN GLARGINE 100 UNIT/ML ~~LOC~~ SOLN: 20.0000 [IU] | Freq: Every day | SUBCUTANEOUS | Status: DC

## 2011-05-03 MED ORDER — ASPIRIN 81 MG PO CHEW
CHEWABLE_TABLET | ORAL | Status: AC
  Filled 2011-05-03: qty 1

## 2011-05-03 MED ORDER — POTASSIUM CHLORIDE CRYS ER 20 MEQ PO TBCR
40.0000 meq | EXTENDED_RELEASE_TABLET | ORAL | Status: AC
  Administered 2011-05-03 (×2): 40 meq via ORAL
  Filled 2011-05-03: qty 2

## 2011-05-03 MED ORDER — POTASSIUM CHLORIDE CRYS ER 20 MEQ PO TBCR
EXTENDED_RELEASE_TABLET | ORAL | Status: AC
  Filled 2011-05-03: qty 2

## 2011-05-03 MED ORDER — OXYCODONE-ACETAMINOPHEN 5-325 MG PO TABS
1.0000 | ORAL_TABLET | ORAL | Status: DC | PRN
  Administered 2011-05-03 – 2011-05-04 (×3): 1 via ORAL
  Filled 2011-05-03 (×3): qty 1

## 2011-05-03 MED ORDER — DEXTROSE 50 % IV SOLN: 25.0000 mL | Freq: Once | INTRAVENOUS | Status: DC

## 2011-05-03 MED ORDER — POTASSIUM PHOSPHATE DIBASIC 3 MMOLE/ML IV SOLN: 30.0000 mmol | Freq: Once | INTRAVENOUS | Status: DC

## 2011-05-03 MED ORDER — NICARDIPINE HCL IN NACL 20-0.86 MG/200ML-% IV SOLN
5.0000 mg/h | INTRAVENOUS | Status: DC
  Administered 2011-05-03 (×3): 5 mg/h via INTRAVENOUS
  Filled 2011-05-03 (×4): qty 200

## 2011-05-03 MED ORDER — PROCHLORPERAZINE EDISYLATE 5 MG/ML IJ SOLN
10.0000 mg | Freq: Four times a day (QID) | INTRAMUSCULAR | Status: DC | PRN
  Administered 2011-05-03 – 2011-05-04 (×2): 10 mg via INTRAVENOUS
  Filled 2011-05-03 (×2): qty 2

## 2011-05-03 MED ORDER — INSULIN GLARGINE 100 UNIT/ML ~~LOC~~ SOLN
10.0000 [IU] | Freq: Two times a day (BID) | SUBCUTANEOUS | Status: DC
  Administered 2011-05-03: 10 [IU] via SUBCUTANEOUS

## 2011-05-03 MED ORDER — INSULIN ASPART 100 UNIT/ML ~~LOC~~ SOLN
0.0000 [IU] | Freq: Three times a day (TID) | SUBCUTANEOUS | Status: DC
  Administered 2011-05-04: 2 [IU] via SUBCUTANEOUS
  Administered 2011-05-04: 8 [IU] via SUBCUTANEOUS
  Administered 2011-05-04: 5 [IU] via SUBCUTANEOUS
  Administered 2011-05-05: 11:00:00 via SUBCUTANEOUS

## 2011-05-03 MED FILL — Nicardipine HCl IV Soln 2.5 MG/ML: INTRAVENOUS | Qty: 1 | Status: AC

## 2011-05-03 NOTE — Progress Notes (Signed)
Pt. Refused cpap for tonight. 

## 2011-05-03 NOTE — Progress Notes (Signed)
  Echocardiogram 2D Echocardiogram has been performed.  Mercy Moore 05/03/2011, 12:00 PM

## 2011-05-03 NOTE — Consult Note (Signed)
Name: [name] MRN: [id number] DOB: [birth year]    LOS: 2  PCCM CONSULTATION NOTE  Active Problems:  Encephalopathy acute  Hypertensive emergency  Hyperglycemia  DM  HYPERTENSION  CORONARY ARTERY DISEASE  CONGESTIVE HEART FAILURE  OSA (obstructive sleep apnea)  History of Present Illness: This is a 66 y/o female with known CAD and HTN who was admitted to the [facility] service on 4/29 with progressive chest pain for two weeks. Cardiology was consulted and the decision was made to send her for a [facility] 4/30. In the evening after the 4/30 procedure she developed headache and worsening hypertension. She was evaluated by the [facility] floor rounder who found no evidence of neurologic deficit and ordered a head CT stat which was read as negative. She was given multiple doses of antihypertensives (labetalol x4 IV doses, hydralazine IV and was continued on her home meds). Despite this, her blood pressure remained elevated so she was sent to 2900 for further evaluation.  Lines / Drains: None  Cultures: None  Antibiotics: None  Tests / Events: 4/30  Head CT >>> NAD, chronic small vessel changes  The patient is encephalopathic and unable to provide history, which was obtained for available medical records.    Past Medical History  Diagnosis Date  . DM   . DYSLIPIDEMIA   . OBESITY NOS   . DEPRESSION, RECURRENT, IN PARTIAL REMISSION   . HYPERTENSION   . 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 CONGESTIVE HEART FAILURE     Cath 05/2008 with non obstructive CAD but global hypokinesis, EF 30-35%  . GERD   . DIVERTICULOSIS, COLON, WITH HEMORRHAGE   . BACK PAIN, LUMBAR, WITH RADICULOPATHY   . ABNORMAL ELECTROCARDIOGRAM   . OBSTRUCTIVE SLEEP APNEA   . Asthma    Past Surgical History  Procedure Date  . Uvuloplasty   . Total abdominal hysterectomy w/ bilateral salpingoophorectomy   . Abdominal hysterectomy   . Cholecystectomy    Prior to Admission  medications   Medication Sig Start Date End Date Taking? Authorizing Provider  aspirin 81 MG tablet Take 81 mg by mouth daily.     Yes Historical Provider, MD  atorvastatin (LIPITOR) 40 MG tablet Take 1 tablet (40 mg total) by mouth daily. 01/30/11 01/30/12 Yes [name], MD  bisacodyl (DULCOLAX) 5 MG EC tablet Take 5 mg by mouth daily as needed. For constipation   Yes Historical Provider, MD  carvedilol (COREG) 25 MG tablet Take 37.5 mg by mouth 2 (two) times daily with a meal. Take one and one half tablet twice daily. PT WANTS BRAND ONLY 04/20/11  Yes [name], MD  furosemide (LASIX) 80 MG tablet Take 80 mg by mouth daily. 04/20/11  Yes [name], MD  insulin aspart (NOVOLOG) 100 UNIT/ML injection Inject 10-15 Units into the skin 3 (three) times daily before meals. 15 units at breakfast 10units at lunch 10 units at dinner   Yes Historical Provider, MD  insulin glargine (LANTUS) 100 UNIT/ML injection Inject 10-20 Units into the skin at bedtime. Per sliding scale   Yes Historical Provider, MD  mometasone-formoterol (DULERA) 100-5 MCG/ACT AERO Inhale 2 puffs into the lungs 2 (two) times daily. 03/30/11  Yes [name], MD  olmesartan (BENICAR) 40 MG tablet Take 40 mg by mouth daily. 03/27/11 03/26/12 Yes [name], MD  pantoprazole (PROTONIX) 40 MG tablet Take 40 mg by mouth 2 (two) times daily. 04/26/11  Yes [name], MD  Polyethylene Glycol 3350 (MIRALAX PO) Take 17 g by mouth daily. As directed    Yes Historical Provider, MD  potassium chloride (MICRO-K) 10 MEQ CR capsule Take 10 mEq by mouth daily.     Yes Historical Provider, MD  SitaGLIPtin-MetFORMIN HCl 50-1000 MG TB24 Take 1 tablet by mouth daily. 10/20/10  Yes [name], MD  venlafaxine (EFFEXOR XR) 150 MG 24 hr capsule Take 150 mg by mouth 2 (two) times daily.     Yes Historical Provider, MD   Allergies Allergies  Allergen Reactions  . Amlodipine Other (See Comments)    constipation  . Ramipril Cough  .  Adhesive (Tape)   . Codeine Other (See Comments)    Reaction unknown  . Penicillins Other (See Comments)    Reaction unknown   Family History Family History  Problem Relation Age of Onset  . Stroke Mother   . Breast cancer Sister   . Emphysema Sister   . Cancer Sister     breast  . Alcohol abuse Brother   . Colon cancer      uncles x 2  . Prostate cancer Brother    Social History  reports that she quit smoking about 4 years ago. Her smoking use included Cigarettes. She has a 20 pack-year smoking history. She has never used smokeless tobacco. She reports that she does not drink alcohol or use illicit drugs.  Review Of Systems  Patient unable to provide  Vital Signs: Temp:  [97.6 F (36.4 C)-98.7 F (37.1 C)] 97.6 F (36.4 C) (04/30 1925) Pulse Rate:  [70-96] 91  (04/30 2345) Resp:  [10-23] 16  (04/30 2345) BP: (144-246)/(78-135) 197/101 mmHg (04/30 2345) SpO2:  [87 %-100 %] 95 % (04/30 2345) Weight:  [97.977 kg (216 lb)] 97.977 kg (216 lb) (04/30 1015) I/O last 3 completed shifts: In: 360 [P.O.:360] Out: -   Physical Examination: General:  Appears to be in no acute distress Neuro:  Somnolent, confused, nonfocal HEENT:  PERRL, pink conjunctivae, moist membranes Neck:  Supple, no JVD   Cardiovascular:  RRR, no M/R/G Lungs:  Bilateral diminished air entry, no W/R/R Abdomen:  Soft, nontender, nondistended, bowel sounds present Musculoskeletal:  Moves all extremities, trace pedal edema Skin:  No rash  Ventilator settings:    Labs and Imaging:  Reviewed.  Please refer to the Assessment and Plan section for relevant results.  ASSESSMENT AND PLAN  NEUROLOGIC A:  Acute encephalopathy (hypertensive, medications).  Negative head CT.  Headache. P: -->  Monitor -->  Dilaudid  PRN headache  PULMONARY No results found for this basename: PHART:5,PCO2:5,PCO2ART:5,PO2ART:5,HCO3:5,O2SAT:5 in the last 168 hours A:  OSA.  Asthma.  No acute bronchospasm. P: -->  CPAP as  preadmission -->  Dulera  CARDIOVASCULAR  Lab 05/02/11 1841 05/02/11 1128 05/02/11 0348 05/01/11 2106  TROPONINI <0.30 <0.30 <0.30 --  LATICACIDVEN -- -- -- --  PROBNP -- -- -- 1119.0*   A:  Hypertensive emergency.  CAD.  CHF.  HTN. P: -->  Hold oral medications as nausea / vomiting -->  Start Cardene gtt -->  Hydralazine + Labetalol IV  PRN -->  Clonidine patch -->  Goal 25% reduction of MAP  RENAL  Lab 05/02/11 2125 05/02/11 0348 05/01/11 2130 05/01/11 2104  NA 136 140 139 135  K 3.4* 4.2 -- --  CL 94* 98 97 94*  CO2 28 31 -- 30  BUN 10 12 16 14   CREATININE 0.82 0.93 1.10 1.08  CALCIUM  9.7 9.6 -- 9.6  MG -- -- -- --  PHOS -- -- -- --   A:  Hypokalemia. P: -->  BMP, Mg, Phos in AM -->  Replace K  GASTROINTESTINAL No results found for this basename: AST:5,ALT:5,ALKPHOS:5,BILITOT:5,PROT:5,ALBUMIN:5 in the last 168 hours A:  Nausea / vomiting, likely secondary to hypertensive emergency.  GERD. P: -->  Phenergan, Zofran -->  Change Protonix to IV -->  NPO  HEMATOLOGIC  Lab 05/02/11 2125 05/02/11 1128 05/02/11 0348 05/01/11 2130 05/01/11 2104  HGB 12.9 -- 11.9* 12.6 11.7*  HCT 38.7 -- 35.9* 37.0 35.6*  PLT 297 -- 264 -- 274  INR -- 1.10 -- -- --  APTT -- -- -- -- --   A:  No active issues. P: -->  CBC in AM  INFECTIOUS  Lab 05/02/11 2125 05/02/11 0348 05/01/11 2104  WBC 12.1* 7.5 8.5  PROCALCITON -- -- --   A:  No active issues. P: -->  No interventions required  ENDOCRINE  Lab 05/02/11 2205 05/02/11 1658 05/02/11 1110 05/02/11 0637 05/01/11 2127  GLUCAP 323* 241* 193* 216* 195*   A:  DM, hyperglycemia. P: -->  D/c SSI -->  Start Insulin gtt and CBG q1h  BEST PRACTICE / DISPOSITION -->  ICU status under TRH -->  PCCM, Cardiology consulting -->  Full code -->  NPO -->  SCDs Newell for DVT Px -->  GI Px is not indicated (on GERD treatment)  Orlean Bradford, M.D., F.C.C.P. Pulmonary and Critical Care Medicine [facility] Cell: [phone] Pager: [phone]  05/03/2011, 12:53 AM

## 2011-05-03 NOTE — Progress Notes (Signed)
Late entry; note for 05/02/11.  Patient received from cath lab approx 1400; denied pain.  BP 160's - 170's over 80's.  At 1540 Pt. Complained of severe HA.  For the next few hours, pat. Medicated repeatedly for HA and HTN. Patient not responding to these meds.   heart and Hospitalist informed and involved.  Also medicated for mild nausea and then vomitted at end of shift.  Stat head CT done accompanied by RN.

## 2011-05-03 NOTE — Progress Notes (Signed)
eLink Physician-Brief Progress Note Patient Name: [name] DOB: 14-Jan-1945 MRN: [id number]  Date of Service  05/03/2011   HPI/Events of Note   Persistent nausea and vomiting despite zofran and phenergan; phenergan made her delirius, neuro exam otherwise non-focal  eICU Interventions    Will order compazine for prn nausea, monitor QTc   Intervention Category Intermediate Interventions: Other: (nausea and vomiting)  [name] 05/03/2011, 2:44 AM

## 2011-05-03 NOTE — Evaluation (Signed)
Physical Therapy Evaluation Patient Details Name: [name] MRN: [id number] DOB: [birth year] Today's Date: 05/03/2011 Time: 6962-9528 PT Time Calculation (min): 21 min  PT Assessment / Plan / Recommendation Clinical Impression  This is a 66 y/o female with known CAD and HTN who was admitted to the [facility] service on 4/29 with progressive chest pain for two weeks. Cardiology was consulted and the decision was made to send her for a [facility] 4/30. In the evening after the 4/30 procedure she developed headache and worsening hypertension.    PT Assessment  Patient needs continued PT services    Follow Up Recommendations  No PT follow up;Supervision/Assistance - 24 hour (to be determined by pt progress. )    Equipment Recommendations  Other (comment) (to be determined. )    Frequency Min 3X/week    Precautions / Restrictions Precautions Precautions: Fall Restrictions Weight Bearing Restrictions: No   Pertinent Vitals/Pain Pt c/o headache 10/10 RN made aware.   BP 132/62      Mobility  Bed Mobility Bed Mobility: Supine to Sit Supine to Sit: 6: Modified independent (Device/Increase time);HOB elevated Details for Bed Mobility Assistance: HOB elevated due to headhache.  Transfers Transfers: Sit to Stand;Stand to [employer] Transfers Sit to Stand: 4: Min assist;With upper extremity assist;From bed Stand to Sit: 4: Min assist;With upper extremity assist;With armrests;To chair/3-in-1 Stand Pivot Transfers: 4: Min assist Details for Transfer Assistance: verbal and tactile cues for hand placement, assist to steady pt to stand and sit.   Ambulation/Gait Ambulation/Gait Assistance: 4: Min assist Ambulation Distance (Feet): 5 Feet Assistive device: Rolling walker Ambulation/Gait Assistance Details: cues for safe use of RW. Assist to steady pt.  Attempt with no AD unsuccessful as pt unable to gain her standing balance without UE support.  Gait Pattern: Step-to pattern;Decreased stride  length Stairs: No Wheelchair Mobility Wheelchair Mobility: No    Exercises     PT Goals Acute Rehab PT Goals PT Goal Formulation: With patient Time For Goal Achievement: 05/17/11 Potential to Achieve Goals: Good Pt will go Sit to Stand: Independently PT Goal: Sit to Stand - Progress: Goal set today Pt will go Stand to Sit: Independently PT Goal: Stand to Sit - Progress: Goal set today Pt will Transfer Bed to Chair/Chair to Bed: Independently PT Transfer Goal: Bed to Chair/Chair to Bed - Progress: Goal set today Pt will Stand: Independently PT Goal: Stand - Progress: Goal set today Pt will Ambulate: 51 - 150 feet;Independently PT Goal: Ambulate - Progress: Goal set today  Visit Information  Last PT Received On: 05/03/11 Assistance Needed: +1    Subjective Data  Subjective: My head hurts, it feels like it is going to explode.  Patient Stated Goal: no headache.    Prior Functioning  Home Living Lives With: Spouse Available Help at Discharge: Family Type of Home: House Home Access: Stairs to enter Home Layout: One level Bathroom Shower/Tub: Forensic psychologist: None Prior Function Level of Independence: Independent Able to Take Stairs?: Yes Driving: Yes Communication Communication: No difficulties Dominant Hand: Right    Cognition  Overall Cognitive Status: Appears within functional limits for tasks assessed/performed Arousal/Alertness: Lethargic Orientation Level: Appears intact for tasks assessed Behavior During Session: [facility] for tasks performed    Extremity/Trunk Assessment Right Upper Extremity Assessment RUE ROM/Strength/Tone: Within functional levels Left Upper Extremity Assessment LUE ROM/Strength/Tone: Within functional levels Right Lower Extremity Assessment RLE ROM/Strength/Tone: Within functional levels Left Lower Extremity Assessment LLE ROM/Strength/Tone: Within functional levels Trunk  Assessment Trunk Assessment: Normal   Balance Balance Balance Assessed: Yes Static Sitting Balance Static Sitting - Balance Support: Feet supported;Bilateral upper extremity supported Static Sitting - Level of Assistance: 5: Stand by assistance Static Sitting - Comment/# of Minutes: 3+ minutes on EOB. Pt with occasional posterior lean  Static Standing Balance Static Standing - Balance Support: Bilateral upper extremity supported Static Standing - Level of Assistance: 3: Mod assist  End of Session PT - End of Session Equipment Utilized During Treatment: Gait belt Activity Tolerance: Patient limited by pain Patient left: in chair;with call bell/phone within reach Nurse Communication: Mobility status;Patient requests pain meds   Aubreigh Fuerte 05/03/2011, 4:23 PM Karlissa Aron L. Dynesha Woolen DPT [phone]

## 2011-05-03 NOTE — Progress Notes (Signed)
SUBJECTIVE: Pt confused this am. Denies SOB or chest pain. Does endorse headache.  Negative head CT for HA last night. BP was high. Now better on Cardine drip.   BP 134/74  Pulse 107  Temp(Src) 100.4 F (38 C) (Oral)  Resp 24  Ht 5\' 10"  (1.778 m)  Wt 199 lb 4.7 oz (90.4 kg)  BMI 28.60 kg/m2  SpO2 99%  Intake/Output Summary (Last 24 hours) at 05/03/11 1610 Last data filed at 05/03/11 0500  Gross per 24 hour  Intake  364.4 ml  Output   1679 ml  Net -1314.6 ml    PHYSICAL EXAM General: Well developed, well nourished, in no acute distress. Pt confused.   Psych:  Pleasant Neck: No JVD. No masses noted.  Lungs: Clear bilaterally with no wheezes or rhonci noted.  Heart: RRR with systolic murmur noted. Abdomen: Bowel sounds are present. Soft, non-tender.  Extremities: No lower extremity edema. Right wrist cath site ok.   LABS: Basic Metabolic Panel:  Basename 05/02/11 2125 05/02/11 0348  NA 136 140  K 3.4* 4.2  CL 94* 98  CO2 28 31  GLUCOSE 310* 210*  BUN 10 12  CREATININE 0.82 0.93  CALCIUM 9.7 9.6  MG -- --  PHOS -- --   CBC:  Basename 05/03/11 0500 05/02/11 2125  WBC 12.3* 12.1*  NEUTROABS -- 9.6*  HGB 12.8 12.9  HCT 38.9 38.7  MCV 86.1 86.0  PLT 256 297   Cardiac Enzymes:  Basename 05/02/11 1841 05/02/11 1128 05/02/11 0348  CKTOTAL 74 71 81  CKMB 3.2 3.0 2.9  CKMBINDEX -- -- --  TROPONINI <0.30 <0.30 <0.30     Current Meds:    . ALPRAZolam  0.25 mg Oral Once  . aspirin      . aspirin  324 mg Oral Once  . aspirin EC  81 mg Oral Daily  . atorvastatin  40 mg Oral Daily  . cloNIDine  0.2 mg Transdermal Weekly  . dextrose  25 mL Intravenous Once  . diazepam  5 mg Oral On Call  . docusate sodium  100 mg Oral BID  . fentaNYL      . furosemide  80 mg Oral Daily  . heparin      . heparin      . heparin  4,000 Units Intravenous Once  . irbesartan  300 mg Oral Daily  . lidocaine      . midazolam      . mometasone-formoterol  2 puff Inhalation  BID  . nitroGLYCERIN      . pantoprazole (PROTONIX) IV  40 mg Intravenous Q24H  . senna  1 tablet Oral BID  . venlafaxine XR  150 mg Oral BID  . DISCONTD: aspirin EC  325 mg Oral Daily  . DISCONTD: aspirin EC  325 mg Oral Daily  . DISCONTD: carvedilol  37.5 mg Oral BID WC  . DISCONTD: cloNIDine  0.1 mg Oral BID  . DISCONTD: cloNIDine  0.2 mg Oral BID  . DISCONTD: heparin  5,000 Units Subcutaneous Q8H  . DISCONTD: ibuprofen  800 mg Oral TID  . DISCONTD: insulin aspart  0-20 Units Subcutaneous TID WC  . DISCONTD: insulin glargine  10 Units Subcutaneous QHS  . DISCONTD: insulin glargine  10-20 Units Subcutaneous QHS  . DISCONTD: insulin glargine  20 Units Subcutaneous QHS  . DISCONTD: pantoprazole  40 mg Oral BID  . DISCONTD: sodium chloride  3 mL Intravenous Q12H  . DISCONTD: sodium chloride  3  mL Intravenous Q12H     ASSESSMENT AND PLAN:  1. Hypertensive urgency: Per PCCM. On Cardine drip with much better BP control.   2. Chest pain: CAD stable by cath yesterday. Continue medical management. She likely had demand ischemia with her hypertensive urgency.  3. Confusion: Head CT negative last night. No focal signs on exam. Per nursing, pt received multiple agents last night for nausea and pain including Phenergan.   [name]  5/1/20138:12 AM

## 2011-05-03 NOTE — Progress Notes (Signed)
HPI:  This is a 66 y/o female with known CAD and HTN who was admitted to the [facility] service on 4/29 with progressive chest pain for two weeks. Cardiology was consulted and the decision was made to send her for a [facility] 4/30. In the evening after the 4/30 procedure she developed headache and worsening hypertension. She was evaluated by the [facility] floor rounder who found no evidence of neurologic deficit and ordered a head CT stat which was read as negative. She was given multiple doses of antihypertensives (labetalol x4 IV doses, hydralazine IV and was continued on her home meds). Despite this, her blood pressure remained elevated so she was sent to 2900 for further evaluation.  Antibiotics:   None  Cultures/Sepsis Markers:   None  Access/Protocols:   Best Practice: DVT:  GI:   Subjective: Complains of pain in her temples.  Physical Exam: Filed Vitals:   05/03/11 0900  BP: 152/80  Pulse: 103  Temp:   Resp: 25   Intake/Output Summary (Last 24 hours) at 05/03/11 0921 Last data filed at 05/03/11 0900  Gross per 24 hour  Intake  450.3 ml  Output   1679 ml  Net -1228.7 ml   Neuro: Alert and oriented, lethargic and following commands. Cardiac: RRR, Nl S1/S2, -M/R/G. Pulmonary: Bibasilar crackles. GI: Soft, NT, ND and +BS. Extremities: -edema and -tenderness.  Labs: CBC    Component Value Date/Time   WBC 12.3* 05/03/2011 0500   RBC 4.52 05/03/2011 0500   HGB 12.8 05/03/2011 0500   HCT 38.9 05/03/2011 0500   PLT 256 05/03/2011 0500   MCV 86.1 05/03/2011 0500   MCH 28.3 05/03/2011 0500   MCHC 32.9 05/03/2011 0500   RDW 15.8* 05/03/2011 0500   LYMPHSABS 1.9 05/02/2011 2125   MONOABS 0.5 05/02/2011 2125   EOSABS 0.1 05/02/2011 2125   BASOSABS 0.0 05/02/2011 2125   BMET    Component Value Date/Time   NA 136 05/02/2011 2125   K 3.4* 05/02/2011 2125   CL 94* 05/02/2011 2125   CO2 28 05/02/2011 2125   GLUCOSE 310* 05/02/2011 2125   BUN 10 05/02/2011 2125   CREATININE 0.82 05/02/2011 2125   CALCIUM 9.7  05/02/2011 2125   GFRNONAA 72* 05/02/2011 2125   GFRAA 84* 05/02/2011 2125   ABG    Component Value Date/Time   PHART 7.372 09/17/2010 0407   PCO2ART 54.5* 09/17/2010 0407   PO2ART 177.0* 09/17/2010 0407   HCO3 31.6* 09/17/2010 0407   TCO2 32 05/01/2011 2130   O2SAT 100.0 09/17/2010 0407   No results found for this basename: MG in the last 168 hours Lab Results  Component Value Date   CALCIUM 9.7 05/02/2011   PHOS 2.5 09/18/2010    Chest Xray:   Assessment & Plan: 66 year old female with history of DM, PVD and HTN presenting with a hypertensive emergency and HTN encephalopathy.  Neuro: Hypertensive encephalopathy but now clouded with narcotic use.  Neg head CT. Plan: D/C dilaudid.  PRN Percocet.  BP control as below.  Cardiac: HTN on cardene drip. Plan:  Start labetalol PO.  Titrate cardene for SBP of 150.  Pulmonary: OSA history and COPD.  Evidence of pulmonary edema. Plan: PRN nebs.  CPAP while in bed once N/V are controlled.  Renal: hypokalemia. Plan: Lasix as ordered.  Replace K.  BMET in AM.  Replace Phos.  GI: NPO, will start diet.  ID: No active issues.  Endocrine: on insulin drip. Plan: Diabetic diet.  Lantus 5 BID  Titrate insulin drip to off.  Diabetic educator consultation.  Koren Bound, MD [phone]

## 2011-05-03 NOTE — Progress Notes (Signed)
Inpatient Diabetes Program Recommendations  AACE/ADA: New Consensus Statement on Inpatient Glycemic Control (2009)  Target Ranges:  Prepandial:   less than 140 mg/dL      Peak postprandial:   less than 180 mg/dL (1-2 hours)      Critically ill patients:  140 - 180 mg/dL   Reason for assessment: Consult requested for insulin recommendations.    Inpatient Diabetes Program Recommendations Insulin - Basal: Lantus 25 units daily OR HS Correction (SSI): Start MODERATE TID + HS Insulin - Meal Coverage: Takes meal coverage at home- 15 units with breakfast, 10 units with both lunch & supper.  NPO currently, but once eating will likely benefit from regularly scheduled meal coverage Novolog in addition to correction scale. HgbA1C: Last known Hbg A1c was 8.8 on 12-15-2010.  Request updated value.  NPO status changed to CHO modified medium.    Will continue to follow.    Thank you  Piedad Climes [facility] Inpatient Diabetes Coordinator [phone]

## 2011-05-03 NOTE — Progress Notes (Signed)
eLink Physician-Brief Progress Note Patient Name: [name] DOB: 01-Sep-1945 MRN: [id number]  Date of Service  05/03/2011   HPI/Events of Note   Glu 138, on diet, on insulin drip, lantus ordered  eICU Interventions  Transition off drip top SSI   Intervention Category Major Interventions: Hyperglycemia - active titration of insulin therapy  Nelda Bucks. 05/03/2011, 9:34 PM

## 2011-05-03 NOTE — Progress Notes (Signed)
Pt husband states that he can not find her bible or cell phone.husband  Looked through all of her belongings tennis  shoes pants arm band x2 bible cell phone glasses change of clothing all found. Suggested to husband to take all belongings home until discharged. Bags given to husband to place the items in and husband states that he will take them home.

## 2011-05-03 NOTE — Progress Notes (Signed)
Utilization review completed.  

## 2011-05-04 ENCOUNTER — Encounter: Payer: Self-pay | Admitting: Physical Medicine and Rehabilitation

## 2011-05-04 ENCOUNTER — Encounter: Payer: Medicare Other | Admitting: Rehabilitative and Restorative Service Providers"

## 2011-05-04 DIAGNOSIS — J45909 Unspecified asthma, uncomplicated: Secondary | ICD-10-CM

## 2011-05-04 DIAGNOSIS — E1165 Type 2 diabetes mellitus with hyperglycemia: Secondary | ICD-10-CM

## 2011-05-04 DIAGNOSIS — I2510 Atherosclerotic heart disease of native coronary artery without angina pectoris: Secondary | ICD-10-CM

## 2011-05-04 DIAGNOSIS — I10 Essential (primary) hypertension: Secondary | ICD-10-CM

## 2011-05-04 DIAGNOSIS — R072 Precordial pain: Secondary | ICD-10-CM

## 2011-05-04 DIAGNOSIS — E119 Type 2 diabetes mellitus without complications: Secondary | ICD-10-CM

## 2011-05-04 LAB — CBC
MCH: 27.7 pg (ref 26.0–34.0)
MCHC: 32.6 g/dL (ref 30.0–36.0)
Platelets: 260 10*3/uL (ref 150–400)
RBC: 4.29 MIL/uL (ref 3.87–5.11)

## 2011-05-04 LAB — D-DIMER, QUANTITATIVE: D-Dimer, Quant: 0.5 ug/mL-FEU — ABNORMAL HIGH (ref 0.00–0.48)

## 2011-05-04 LAB — GLUCOSE, CAPILLARY
Glucose-Capillary: 161 mg/dL — ABNORMAL HIGH (ref 70–99)
Glucose-Capillary: 167 mg/dL — ABNORMAL HIGH (ref 70–99)
Glucose-Capillary: 145 mg/dL — ABNORMAL HIGH (ref 70–99)
Glucose-Capillary: 153 mg/dL — ABNORMAL HIGH (ref 70–99)
Glucose-Capillary: 284 mg/dL — ABNORMAL HIGH (ref 70–99)

## 2011-05-04 LAB — URINALYSIS, MICROSCOPIC ONLY
Bilirubin Urine: NEGATIVE
Glucose, UA: 100 mg/dL — AB
Hgb urine dipstick: NEGATIVE
Specific Gravity, Urine: 1.012 (ref 1.005–1.030)
pH: 6 (ref 5.0–8.0)

## 2011-05-04 LAB — PHOSPHORUS: Phosphorus: 2.6 mg/dL (ref 2.3–4.6)

## 2011-05-04 LAB — BASIC METABOLIC PANEL
Calcium: 9.3 mg/dL (ref 8.4–10.5)
GFR calc non Af Amer: 87 mL/min — ABNORMAL LOW (ref >90)
Potassium: 3.4 mEq/L — ABNORMAL LOW (ref 3.5–5.1)
Sodium: 134 mEq/L — ABNORMAL LOW (ref 135–145)

## 2011-05-04 LAB — MAGNESIUM: Magnesium: 1.6 mg/dL (ref 1.5–2.5)

## 2011-05-04 MED ORDER — CARVEDILOL 25 MG PO TABS
37.5000 mg | ORAL_TABLET | Freq: Two times a day (BID) | ORAL | Status: DC
  Administered 2011-05-04 – 2011-05-05 (×2): 37.5 mg via ORAL
  Filled 2011-05-04 (×4): qty 1

## 2011-05-04 MED ORDER — POTASSIUM PHOSPHATE DIBASIC 3 MMOLE/ML IV SOLN
30.0000 mmol | Freq: Once | INTRAVENOUS | Status: AC
  Administered 2011-05-04: 30 mmol via INTRAVENOUS
  Filled 2011-05-04: qty 10

## 2011-05-04 MED ORDER — FUROSEMIDE 40 MG PO TABS
40.0000 mg | ORAL_TABLET | Freq: Every day | ORAL | Status: DC
  Administered 2011-05-04 – 2011-05-05 (×2): 40 mg via ORAL
  Filled 2011-05-04: qty 1

## 2011-05-04 MED ORDER — INSULIN GLARGINE 100 UNIT/ML ~~LOC~~ SOLN
20.0000 [IU] | Freq: Two times a day (BID) | SUBCUTANEOUS | Status: DC
  Administered 2011-05-04 – 2011-05-05 (×3): 20 [IU] via SUBCUTANEOUS

## 2011-05-04 MED ORDER — FUROSEMIDE 20 MG PO TABS: 20.0000 mg | ORAL_TABLET | Freq: Every day | ORAL | Status: DC

## 2011-05-04 MED ORDER — LABETALOL HCL 200 MG PO TABS
200.0000 mg | ORAL_TABLET | Freq: Three times a day (TID) | ORAL | Status: DC
  Administered 2011-05-04: 200 mg via ORAL
  Filled 2011-05-04 (×3): qty 1

## 2011-05-04 MED ORDER — MAGNESIUM SULFATE 40 MG/ML IJ SOLN
2.0000 g | Freq: Once | INTRAMUSCULAR | Status: AC
  Administered 2011-05-04: 2 g via INTRAVENOUS
  Filled 2011-05-04: qty 50

## 2011-05-04 MED ORDER — CARVEDILOL 25 MG PO TABS
37.5000 mg | ORAL_TABLET | Freq: Two times a day (BID) | ORAL | Status: DC
  Filled 2011-05-04 (×3): qty 1

## 2011-05-04 MED ORDER — SODIUM CHLORIDE 0.9 % IV SOLN
INTRAVENOUS | Status: DC
  Administered 2011-05-04: 10 mL/h via INTRAVENOUS

## 2011-05-04 MED ORDER — FUROSEMIDE 40 MG PO TABS: 40.0000 mg | ORAL_TABLET | Freq: Every day | ORAL | Status: DC

## 2011-05-04 MED ORDER — POTASSIUM CHLORIDE CRYS ER 20 MEQ PO TBCR
40.0000 meq | EXTENDED_RELEASE_TABLET | ORAL | Status: AC
  Administered 2011-05-04 (×2): 40 meq via ORAL
  Filled 2011-05-04: qty 2
  Filled 2011-05-04 (×2): qty 1

## 2011-05-04 NOTE — Progress Notes (Addendum)
HPI:  This is a 66 y/o female with known CAD and HTN who was admitted to the [facility] service on 4/29 with progressive chest pain for two weeks. Cardiology was consulted and the decision was made to send her for a [facility] 4/30. In the evening after the 4/30 procedure she developed headache and worsening hypertension. She was evaluated by the [facility] floor rounder who found no evidence of neurologic deficit and ordered a head CT stat which was read as negative. She was given multiple doses of antihypertensives (labetalol x4 IV doses, hydralazine IV and was continued on her home meds). Despite this, her blood pressure remained elevated so she was sent to 2900 for further evaluation.  Antibiotics:   None  Cultures/Sepsis Markers:   None  Access/Protocols:  PIV  Best Practice: DVT: SCD's and Heparin GI: Protonix  Subjective: Complains of pain in her temples.  Physical Exam: Filed Vitals:   05/04/11 0800  BP: 180/95  Pulse:   Temp: 98.7 F (37.1 C)  Resp: 16   Intake/Output Summary (Last 24 hours) at 05/04/11 0944 Last data filed at 05/04/11 0900  Gross per 24 hour  Intake  464.6 ml  Output   3425 ml  Net -2960.4 ml   Neuro: Alert and oriented, lethargic and following commands. Cardiac: RRR, Nl S1/S2, -M/R/G. Pulmonary: Bibasilar crackles. GI: Soft, NT, ND and +BS. Extremities: -edema and -tenderness.  Labs: CBC    Component Value Date/Time   WBC 12.5* 05/04/2011 0505   RBC 4.29 05/04/2011 0505   HGB 11.9* 05/04/2011 0505   HCT 36.5 05/04/2011 0505   PLT 260 05/04/2011 0505   MCV 85.1 05/04/2011 0505   MCH 27.7 05/04/2011 0505   MCHC 32.6 05/04/2011 0505   RDW 16.0* 05/04/2011 0505   LYMPHSABS 1.9 05/02/2011 2125   MONOABS 0.5 05/02/2011 2125   EOSABS 0.1 05/02/2011 2125   BASOSABS 0.0 05/02/2011 2125   BMET    Component Value Date/Time   NA 134* 05/04/2011 0505   K 3.4* 05/04/2011 0505   CL 94* 05/04/2011 0505   CO2 27 05/04/2011 0505   GLUCOSE 222* 05/04/2011 0505   BUN 11 05/04/2011 0505   CREATININE 0.71 05/04/2011 0505   CALCIUM 9.3 05/04/2011 0505   GFRNONAA 87* 05/04/2011 0505   GFRAA >90 05/04/2011 0505   ABG    Component Value Date/Time   PHART 7.372 09/17/2010 0407   PCO2ART 54.5* 09/17/2010 0407   PO2ART 177.0* 09/17/2010 0407   HCO3 31.6* 09/17/2010 0407   TCO2 32 05/01/2011 2130   O2SAT 100.0 09/17/2010 0407    Lab 05/04/11 0505  MG 1.6   Lab Results  Component Value Date   CALCIUM 9.3 05/04/2011   PHOS 2.6 05/04/2011    Chest Xray:   Assessment & Plan: 66 year old female with history of DM, PVD and HTN presenting with a hypertensive emergency and HTN encephalopathy.  Neuro: Hypertensive encephalopathy but now clouded with narcotic use.  Neg head CT. Plan: D/C dilaudid.  PRN Percocet.  BP control as below.  Cardiac: HTN on cardene drip. Plan:  Increase labetalol PO per cards.  Titrate cardene for SBP of 150.  Pulmonary: OSA history and COPD.  Evidence of pulmonary edema. Plan: PRN nebs.  CPAP while in bed once N/V are controlled.  Renal: hypokalemia, hypomag and hypophos. Plan: Lasix as ordered.  Replace K.  BMET in AM.  Replace Phos, Mg and Phos.  GI: Continue diet.  ID: No active issues.  Endocrine: on insulin  drip. Plan: Diabetic diet.  Lantus per diabetic educator.  Titrate insulin drip to off.  Diabetic educator consultation appreciated.  Patient ready for SDU transfer and back to [facility].  PCCM signing off, please call back if needed.  Koren Bound, MD [phone]

## 2011-05-04 NOTE — Progress Notes (Signed)
[facility] Resident Note  Subjective:  Headache continued overnight.  Titrated off the Cardene drip yesterday afternoon to TID labetolol.  SBPs running in the 150-190s.  No recurrence of chest pain currently.    Objective:  Vital Signs in the last 24 hours: Filed Vitals:   05/04/11 0200 05/04/11 0300 05/04/11 0400 05/04/11 0500  BP: 157/89 170/76 155/84 166/76  Pulse:      Temp:   98.6 F (37 C)   TempSrc:   Oral   Resp: 18 18 18 17   Height:      Weight:   199 lb 8.3 oz (90.5 kg)   SpO2:   99%    Intake/Output from previous day: 05/01 0701 - 05/02 0700 In: 519.9 [P.O.:360; I.V.:159.9] Out: 2925 [Urine:2925] Physical Exam: Vitals reviewed. General: resting in bed, moderate distress from headache pain.   HEENT: PERRL, EOMI, no scleral icterus, pain to palpation over the right temple area.  No visual changes or optical disc edema Cardiac: RRR, no rubs, murmurs or gallops Pulm: clear to auscultation bilaterally, no wheezes, rales, or rhonchi Abd: soft, nontender, nondistended, BS present Ext: warm and well perfused, no pedal edema Neuro: alert and oriented X3, cranial nerves II-XII grossly intact, strength and sensation to light touch equal in bilateral upper and lower extremities  Lab Results:  Basename 05/04/11 0505 05/03/11 0500  WBC 12.5* 12.3*  HGB 11.9* 12.8  PLT 260 256    Basename 05/04/11 0505 05/02/11 2125  NA 134* 136  K 3.4* 3.4*  CL 94* 94*  CO2 27 28  GLUCOSE 222* 310*  BUN 11 10  CREATININE 0.71 0.82    Basename 05/02/11 1841 05/02/11 1128  TROPONINI <0.30 <0.30    Basename 05/03/11 2137 05/03/11 1959 05/03/11 1850 05/03/11 1153 05/03/11 1001 05/03/11 0905  GLUCAP 147* 138* 148* 125* 156* 173*    Basename 05/01/11 2106  PROBNP 1119.0*   Cardiac Studies: Left Main: 20% plaque, LAD distally very small with diffuse disease and a focal 99% stenosis that is too small for intervention.  Circ: Moderated sized vessel with mild  plaque.  RCA: Patent stents with 30% just before stent. Mild stent restenosis.    Tele: NSR overnight.  Short run of Mobitz 1 yesterday morning.  Scheduled Meds:   . aspirin EC  81 mg Oral Daily  . atorvastatin  40 mg Oral Daily  . cloNIDine  0.2 mg Transdermal Weekly  . dextrose  25 mL Intravenous Once  . docusate sodium  100 mg Oral BID  . furosemide  20 mg Intravenous Q8H  . insulin aspart  0-15 Units Subcutaneous TID WC  . insulin aspart  0-5 Units Subcutaneous QHS  . insulin glargine  10 Units Subcutaneous BID  . irbesartan  300 mg Oral Daily  . labetalol  100 mg Oral TID  . mometasone-formoterol  2 puff Inhalation BID  . pantoprazole  40 mg Oral Daily  . potassium chloride SA      . potassium chloride  40 mEq Oral Q4H  . senna  1 tablet Oral BID  . venlafaxine XR  150 mg Oral BID  . DISCONTD: furosemide  80 mg Oral Daily  . DISCONTD: insulin glargine  5 Units Subcutaneous BID  . DISCONTD: pantoprazole (PROTONIX) IV  40 mg Intravenous Q24H  . DISCONTD: potassium phosphate IVPB (mmol)  30 mmol Intravenous Once   Continuous Infusions:   . DISCONTD: sodium chloride 100 mL/hr at 05/02/11 1400  . DISCONTD: insulin (  NOVOLIN-R) infusion    . DISCONTD: niCARDipine Stopped (05/03/11 1404)   PRN Meds:.acetaminophen, acetaminophen, bisacodyl, hydrALAZINE, ondansetron, oxyCODONE-acetaminophen, polyethylene glycol, prochlorperazine, promethazine, traMADol, DISCONTD:  HYDROmorphone (DILAUDID) injection, DISCONTD: labetalol  Imaging: Ct Head Wo Contrast  05/02/2011  *RADIOLOGY REPORT*  Clinical Data: 66 year old female with headache.  Earlier cardiac catheterization.  CT HEAD WITHOUT CONTRAST  Technique:  Contiguous axial images were obtained from the base of the skull through the vertex without contrast.  Comparison: 08/04/2005.  Findings: Visualized paranasal sinuses and mastoids are clear. Dural calcifications. No acute osseous abnormality identified. Visualized orbits and scalp soft  tissues are within normal limits.  Stable cerebral volume.  No ventriculomegaly.  Patchy confluent cerebral white matter hypodensity has increased. No evidence of cortically based acute infarction identified.  No acute intracranial hemorrhage identified.  No midline shift, mass effect, or evidence of mass lesion.  No suspicious intracranial vascular hyperdensity.  IMPRESSION: 1. No acute intracranial abnormality. 2.  Progression of nonspecific cerebral white matter changes since 2007, favor chronic small vessel disease.  Original Report Authenticated By: Harley Hallmark, M.D.   Assessment/Plan:  1. Hypertensive urgency: Per PCCM. Transitioned off cardene drip to TID labetolol.  Will increase to 200 mg TID today since she is still not controlled as of yet.    2. Chest pain: CAD stable by cath yesterday. Continue medical management. She likely had demand ischemia with her hypertensive urgency.   3. Confusion and headache: Head CT negative.  Right temporal artery appears to be bounding more then left.  no other focal signs including jaw claudication.  ESR on 4/30 was 60 which is elevated.  No focal neurological defects noted.  Would consider temporal artery biopsy and possible initiation of steroid treatment.   PRIBULA,CHRISTOPHER, M.D. 05/04/2011, 7:35 AM  History reviewed with the patient, no changes to be made. She is predominantly complaining of headache located over the right temporal region.  No SOB.  The patient exam reveals Lungs clear.  COR RRR.  Head Tender to touch right temporal region.  Ext No edema.  All available labs, radiology testing, previous records reviewed. Agree with documented assessment and plan.  HTN:  Labetalol increased.  Etiology for BP might be pain.  Headache:  Consider temporal arteritis.  We will defer work up of this to primary team.  ESR is elevated.   [name]  7:55 AM 11/18/2010

## 2011-05-04 NOTE — Progress Notes (Signed)
[facility]   Subjective: She was initially laying in bed with eyes closed, said she did not want to open her eyes but later when I went back in, she opened her eyes and was very alert. She was giving me the dosages of her meds which unfortunately did not match up with what we have documented in the computer. Nurse also reports that this morning the patient told her she was seeing things in her room. Difficult to obtain adequate current or past medical history on this patient. She is able to tell us she has a history of chronic low back pain and still has a mild headache. No other complaints. Objective: Vital signs in last 24 hours: Temp:  [98.6 F (37 C)-98.8 F (37.1 C)] 98.8 F (37.1 C) (05/02 1200) Pulse Rate:  [81-101] 101  (05/02 0115) Resp:  [10-26] 15  (05/02 1300) BP: (135-190)/(56-113) 175/87 mmHg (05/02 1300) SpO2:  [94 %-100 %] 99 % (05/02 1300) Weight:  [90.5 kg (199 lb 8.3 oz)] 90.5 kg (199 lb 8.3 oz) (05/02 0400) Weight change: -7.477 kg (-16 lb 7.7 oz) Last BM Date: 05/02/11  Intake/Output from previous day: 05/01 0701 - 05/02 0700 In: 519.9 [P.O.:360; I.V.:159.9] Out: 2925 [Urine:2925] Intake/Output this shift: Total I/O In: 780 [P.O.:200; I.V.:30; IV Piggyback:550] Out: 500 [Urine:500]  General appearance: appears stated age, no distress and slowed mentation Head: Normocephalic, without obvious abnormality, atraumatic; temporal arteries not palpable and nontender Resp: clear to auscultation bilaterally, room air with saturations 99% Cardio: regular rate and rhythm, S1, S2 normal, no murmur, click, rub or gallop GI: soft, non-tender; bowel sounds normal; no masses,  no organomegaly Extremities: extremities normal, atraumatic, no cyanosis or edema Neurologic: Follows commands occasionally but seems unwilling to open eyes at times but then spontaneously opened eyes to speak to the attending physician. Exam otherwise nonfocal. Patient seems confused and  reported to RN visual hallucinations  Lab Results:  Basename 05/04/11 0505 05/03/11 0500  WBC 12.5* 12.3*  HGB 11.9* 12.8  HCT 36.5 38.9  PLT 260 256   BMET  Basename 05/04/11 0505 05/02/11 2125  NA 134* 136  K 3.4* 3.4*  CL 94* 94*  CO2 27 28  GLUCOSE 222* 310*  BUN 11 10  CREATININE 0.71 0.82  CALCIUM 9.3 9.7    Studies/Results: Ct Head Wo Contrast  05/02/2011  *RADIOLOGY REPORT*  Clinical Data: 66 year old female with headache.  Earlier cardiac catheterization.  CT HEAD WITHOUT CONTRAST  Technique:  Contiguous axial images were obtained from the base of the skull through the vertex without contrast.  Comparison: 08/04/2005.  Findings: Visualized paranasal sinuses and mastoids are clear. Dural calcifications. No acute osseous abnormality identified. Visualized orbits and scalp soft tissues are within normal limits.  Stable cerebral volume.  No ventriculomegaly.  Patchy confluent cerebral white matter hypodensity has increased. No evidence of cortically based acute infarction identified.  No acute intracranial hemorrhage identified.  No midline shift, mass effect, or evidence of mass lesion.  No suspicious intracranial vascular hyperdensity.  IMPRESSION: 1. No acute intracranial abnormality. 2.  Progression of nonspecific cerebral white matter changes since 2007, favor chronic small vessel disease.  Original Report Authenticated By: Harley Hallmark, M.D.    Medications: I have reviewed the patient's current medications.  Assessment/Plan:  Principal Problem:  *Hypertensive emergency *Cardene infusion has been discontinued in favor of oral medications *Cardiology started labetalol 200 mg 3 times a day today. We reviewed the patient's home medications and she is normally on 37.5  mg of Corag twice a day so we have DC'd the labetalol in favor of this medication. *Continue Catapres patch, Lasix, Irbesartin. Hydralazine PRN for HTN.   Active Problems:  CORONARY ARTERY DISEASE *Status  post cardiac catheterization this admission through the right radial artery *Stable double vessel coronary disease with patent stents-moderate global LV dysfunction with an EF of 35%   Encephalopathy acute *Suspect to do to significant hypertension and resultant edema-consistent with PRES.  *CT scan negative for acute process and since patient is slowly improving no indication at this point to perform MRI  Headache *Had severe headache after having received IV nitroglycerin. She had associated hypertension crisis and it is unclear as to whether the headache precipitated the hypertension the hypertension precipitated headache. *Has mildly elevated ESR at 80. On clinical exam does not have any current symptomatology consistent with temporal arteritis: Temporal arteries are nonpalpable and they are not tender on exam-she has no visual loss which is acute. In addition she was screened for possible polymyalgia rheumatica and she does not endorse issues with proximal muscle weakness and/or diffuse myalgic pain *As long as current symptoms remain stable and/or improving this workup can be deferred to her primary care physician as an outpatient  Elevated ESR See above.   Leukocytosis *Given her acute encephalopathy which could be metabolic in nature we will go ahead and rule out urinary tract infection obtaining urinalysis and culture   DM/ Hyperglycemia *CBGs still over 200 the patient was also receiving continuous IV infusion Cardene and based fluid of dextrose *Adjust sliding scale insulin and change Lantus to hour of sleep dosing as per usual home routine   CONGESTIVE HEART FAILURE *Compensated at this point-management per cardiology team   OSA (obstructive sleep apnea)/OBESITY NOS   DEPRESSION, RECURRENT, IN PARTIAL REMISSION   Anemia    LOS: 3 days   Junious Silk, ANP pager [phone]  [facility] 1 [email or web address] Password: [email or web address]  05/04/2011, 1:52 PM  I have examined  the patient and reviewed the chart. I have modified the above note and agree with it. The patient stated to me she takes 80 units of Lantus and 20 mg of Lasix at home. I am not certain if this is accurate. Will need to review with her when she is more oriented.   Calvert Cantor, MD [phone]

## 2011-05-05 DIAGNOSIS — E1165 Type 2 diabetes mellitus with hyperglycemia: Secondary | ICD-10-CM

## 2011-05-05 DIAGNOSIS — R609 Edema, unspecified: Secondary | ICD-10-CM

## 2011-05-05 DIAGNOSIS — I2510 Atherosclerotic heart disease of native coronary artery without angina pectoris: Secondary | ICD-10-CM

## 2011-05-05 DIAGNOSIS — I509 Heart failure, unspecified: Secondary | ICD-10-CM

## 2011-05-05 DIAGNOSIS — R072 Precordial pain: Secondary | ICD-10-CM

## 2011-05-05 DIAGNOSIS — I10 Essential (primary) hypertension: Secondary | ICD-10-CM

## 2011-05-05 LAB — GLUCOSE, CAPILLARY
Glucose-Capillary: 146 mg/dL — ABNORMAL HIGH (ref 70–99)
Glucose-Capillary: 213 mg/dL — ABNORMAL HIGH (ref 70–99)

## 2011-05-05 LAB — URINE CULTURE

## 2011-05-05 LAB — BASIC METABOLIC PANEL
CO2: 27 mEq/L (ref 19–32)
Calcium: 8.9 mg/dL (ref 8.4–10.5)
Creatinine, Ser: 1.01 mg/dL (ref 0.50–1.10)
Glucose, Bld: 153 mg/dL — ABNORMAL HIGH (ref 70–99)

## 2011-05-05 LAB — CBC
HCT: 33.1 % — ABNORMAL LOW (ref 36.0–46.0)
Platelets: 215 10*3/uL (ref 150–400)
WBC: 7.4 10*3/uL (ref 4.0–10.5)

## 2011-05-05 MED ORDER — SPIRONOLACTONE 12.5 MG HALF TABLET
12.5000 mg | ORAL_TABLET | Freq: Every day | ORAL | Status: DC
  Filled 2011-05-05: qty 1

## 2011-05-05 MED ORDER — SPIRONOLACTONE 12.5 MG HALF TABLET: 12.5000 mg | ORAL_TABLET | Freq: Every day | ORAL | Status: DC

## 2011-05-05 MED ORDER — CLONIDINE HCL 0.2 MG/24HR TD PTWK: 1.0000 | MEDICATED_PATCH | TRANSDERMAL | Status: DC

## 2011-05-05 MED ORDER — DICLOFENAC SODIUM 1 % TD GEL
Freq: Four times a day (QID) | TRANSDERMAL | Status: DC
  Filled 2011-05-05: qty 100

## 2011-05-05 MED ORDER — DICLOFENAC SODIUM 1 % TD GEL: 1.0000 "application " | Freq: Four times a day (QID) | TRANSDERMAL | Status: DC

## 2011-05-05 NOTE — Progress Notes (Signed)
VASCULAR LAB PRELIMINARY  PRELIMINARY  PRELIMINARY  PRELIMINARY  Right leg venous duplex is completed.    Preliminary report:  Right leg venous duplex is negative for deep and superficial vein thrombosis.   Vanna Scotland,  RVT 05/05/2011, 12:20 PM

## 2011-05-05 NOTE — Progress Notes (Signed)
[facility] This is a 66 y/o female with known CAD and HTN who was admitted on 4/29 with progressive chest pain for two weeks. Cardiology was consulted and the decision was made to send her for a [facility] 4/30. In the evening after the 4/30 procedure she developed headache and worsening hypertension. She was evaluated by the [facility] floor rounder who found no evidence of neurologic deficit and ordered a head CT stat which was read as negative. She was given multiple doses of antihypertensives (labetalol x4 IV doses, hydralazine IV and was continued on her home meds). Despite this, her blood pressure remained elevated so she was sent to 2900 for cardene drip.      Subjective:  Wants to go home  Objective: Vital signs in last 24 hours: Temp:  [97.4 F (36.3 C)-98.8 F (37.1 C)] 97.7 F (36.5 C) (05/03 0500) Pulse Rate:  [72-75] 72  (05/03 0500) Resp:  [15-23] 20  (05/03 0500) BP: (128-175)/(69-98) 150/77 mmHg (05/03 0500) SpO2:  [92 %-100 %] 100 % (05/03 0805) Weight:  [98.975 kg (218 lb 3.2 oz)] 98.975 kg (218 lb 3.2 oz) (05/03 0500) Weight change: 8.475 kg (18 lb 10.9 oz) Last BM Date: 05/04/11  Intake/Output from previous day: 05/02 0701 - 05/03 0700 In: 1040 [P.O.:440; I.V.:50; IV Piggyback:550] Out: 800 [Urine:800] Intake/Output this shift:    Lab Results:  Basename 05/05/11 0555 05/04/11 0505  WBC 7.4 12.5*  HGB 10.9* 11.9*  HCT 33.1* 36.5  PLT 215 260   BMET  Basename 05/05/11 0555 05/04/11 0505  NA 130* 134*  K 4.1 3.4*  CL 94* 94*  CO2 27 27  GLUCOSE 153* 222*  BUN 16 11  CREATININE 1.01 0.71  CALCIUM 8.9 9.3    Studies/Results: No results found.  Medications:    . aspirin EC  81 mg Oral Daily  . atorvastatin  40 mg Oral Daily  . carvedilol  37.5 mg Oral BID WC  . cloNIDine  0.2 mg Transdermal Weekly  . docusate sodium  100 mg Oral BID  . furosemide  40 mg Oral Daily  . insulin aspart  0-15 Units Subcutaneous TID WC  . insulin aspart  0-5 Units  Subcutaneous QHS  . insulin glargine  20 Units Subcutaneous BID  . irbesartan  300 mg Oral Daily  . magnesium sulfate 1 - 4 g bolus IVPB  2 g Intravenous Once  . mometasone-formoterol  2 puff Inhalation BID  . pantoprazole  40 mg Oral Daily  . potassium chloride  40 mEq Oral Q4H  . potassium phosphate IVPB (mmol)  30 mmol Intravenous Once  . senna  1 tablet Oral BID  . venlafaxine XR  150 mg Oral BID  . DISCONTD: carvedilol  37.5 mg Oral BID WC  . DISCONTD: dextrose  25 mL Intravenous Once  . DISCONTD: furosemide  20 mg Oral Daily  . DISCONTD: furosemide  40 mg Oral Daily  . DISCONTD: insulin glargine  10 Units Subcutaneous BID  . DISCONTD: labetalol  200 mg Oral TID      Assessment/Plan:  Principal Problem:  *Hypertensive emergency *Cardene infusion has been discontinued in favor of oral medications *Cardiology started labetalol 200 mg 3 times a day today. We reviewed the patient's home medications and she is normally on 37.5 mg of Corag twice a day so we have DC'd the labetalol in favor of this medication. *Continue Catapres patch, Lasix, Irbesartin. Hydralazine PRN for HTN.   Active Problems:  CORONARY ARTERY DISEASE *Status post  cardiac catheterization this admission through the right radial artery *Stable double vessel coronary disease with patent stents-moderate global LV dysfunction with an EF of 35%   Encephalopathy acute *Suspect to do to significant hypertension and resultant edema-consistent with PRES.  *CT scan negative for acute process and since patient is slowly improving no indication at this point to perform MRI  Headache *Had severe headache after having received IV nitroglycerin. She had associated hypertension crisis and it is unclear as to whether the headache precipitated the hypertension the hypertension precipitated headache. *Has mildly elevated ESR at 80. On clinical exam does not have any current symptomatology consistent with temporal arteritis:  Temporal arteries are nonpalpable and they are not tender on exam-she has no visual loss which is acute. In addition she was screened for possible polymyalgia rheumatica and she does not endorse issues with proximal muscle weakness and/or diffuse myalgic pain *As long as current symptoms remain stable and/or improving this workup can be deferred to her primary care physician as an outpatient  Elevated ESR See above.   Leukocytosis *Given her acute encephalopathy which could be metabolic in nature we will go ahead and rule out urinary tract infection obtaining urinalysis and culture   DM/ Hyperglycemia *CBGs still over 200 the patient was also receiving continuous IV infusion Cardene and based fluid of dextrose *Adjust sliding scale insulin and change Lantus to hour of sleep dosing as per usual home routine   CONGESTIVE HEART FAILURE *Compensated at this point-management per cardiology team   OSA (obstructive sleep apnea)/OBESITY NOS   DEPRESSION, RECURRENT, IN PARTIAL REMISSION   Anemia   Baden Betsch   Wants to go home

## 2011-05-05 NOTE — Progress Notes (Signed)
[facility] progress note. Chief complaint. Right calf swelling. History of present illness This 66 year old female in hospital with hypertensive urgency, coronary artery disease status post cardiac cath, encephalopathy, headache, etc. She complained to nursing about a raised area of swelling on the right calf. I initiated SCD and requested a d-dimer level prior to going to see the patient. The d-dimer level did return slightly elevated at 0.5. I did go see the patient at the bedside. She tells me that she has had areas of swelling similar to this on her left arm and a ultrasound was obtained earlier in this hospitalization. This reported negative for left arm DVT. At the bedside I note area in her lateral calf of the right leg which is edematous. This seems associated with the calf musculature. Unclear of the significance but could possibly represent superficial phlebitis. This seems unlikely however given the lack of a warmth/heat at the site and lack of pain. This may represent some type of a hematoma but clinically I doubt that. I don't think cellulitis is in the differential either given the presentation. Vital signs temperature 98.4, pulse 75, respiration 20, blood pressure 163/89. O2 sats 92%. General appearance well-developed elderly female in no distress. Alert, cooperative, oriented. Cardiac. Rate and rhythm regular. No significant edema and negative Homans. Lungs. Diminished in the bases but otherwise clear without distress or cough. Abdomen. Soft obese with positive bowel sounds. No pain. Impression/plan Problem #1 the left calf edema. I will go ahead and order a venous Doppler study of the left leg rule out DVT or possibly superficial phlebitis. I will defer consideration of any type of anticoagulation until the study results.

## 2011-05-05 NOTE — Progress Notes (Signed)
HOME HEALTH AGENCIES SERVING [place]   Agencies that are Medicare-Certified and are affiliated with [facility]  Telephone Number Address  [facility]   The [facility] has ownership interest in this company; however, you are under no obligation to use this agency. [phone] or  [phone] [address] [place], [place] [address]   Agencies that are Medicare-Certified and are not affiliated with [facility]                                                                                 Home Health Agency Telephone Number Address  [facility] [phone] Fax [phone] [address], [address] [place], [place]  [address]  [facility] [phone] or [phone] Fax [phone] [address] [place], [place] [address]  [facility] [phone] Fax [phone] [address] [place], [place] [address]  [facility] [phone] Fax [phone] [address], [address] [place], [place]  [address]  [facility] [phone] Fax [phone] [address], [address] [place], [place] [address]  [facility] [phone] [address] , [place] [address]  [facility] [phone]  [address] [place], [place] [address]  [facility] [phone] or [phone] Fax [phone] [address], [address] [place],   [address]  [facility] [phone] Fax [phone] [address] [place], [place]  [address]  [facility]  [phone] Fax [phone] [address] [place], [place] [address]      

## 2011-05-05 NOTE — Progress Notes (Signed)
PT Cancellation Note  Treatment cancelled today due to patient pending dopplers to rule out DVT. We will check back at later time/date.  Edwyna Perfect, PT  Pager [phone]  05/05/2011, 9:29 AM

## 2011-05-05 NOTE — Discharge Summary (Signed)
Patient ID: [name] MRN: [id number] DOB/AGE: Jun 24, 1945 66 y.o. Primary Care Physician:Thomas Yetta Barre, MD, MD Admit date: 05/01/2011 Discharge date: 05/05/2011    Discharge Diagnoses:   Principal Problem:  *Hypertensive emergency Active Problems:  DM  OBESITY NOS  DEPRESSION, RECURRENT, IN PARTIAL REMISSION  HYPERTENSION  CORONARY ARTERY DISEASE  CONGESTIVE HEART FAILURE  Anemia  Encephalopathy acute  Hyperglycemia  OSA (obstructive sleep apnea)   Medication List  As of 05/05/2011  5:05 PM   START taking these medications         cloNIDine 0.2 mg/24hr patch   Commonly known as: CATAPRES - Dosed in mg/24 hr   Place 1 patch (0.2 mg total) onto the skin once a week.      diclofenac sodium 1 % Gel   Commonly known as: VOLTAREN   Apply 1 application topically 4 (four) times daily.      spironolactone 12.5 mg Tabs   Commonly known as: ALDACTONE   Take 0.5 tablets (12.5 mg total) by mouth daily.         CONTINUE taking these medications         aspirin 81 MG tablet      atorvastatin 40 MG tablet   Commonly known as: LIPITOR   Take 1 tablet (40 mg total) by mouth daily.      carvedilol 25 MG tablet   Commonly known as: COREG      EFFEXOR XR 150 MG 24 hr capsule   Generic drug: venlafaxine XR      furosemide 80 MG tablet   Commonly known as: LASIX      insulin aspart 100 UNIT/ML injection   Commonly known as: novoLOG      insulin glargine 100 UNIT/ML injection   Commonly known as: LANTUS      MIRALAX PO      mometasone-formoterol 100-5 MCG/ACT Aero   Commonly known as: DULERA      olmesartan 40 MG tablet   Commonly known as: BENICAR      pantoprazole 40 MG tablet   Commonly known as: PROTONIX      potassium chloride 10 MEQ CR capsule   Commonly known as: MICRO-K      SitaGLIPtin-MetFORMIN HCl 50-1000 MG Tb24   Take 1 tablet by mouth daily.         STOP taking these medications         B-D ULTRAFINE III SHORT PEN 31G X 8 MM Misc     bisacodyl 5 MG EC tablet          Where to get your medications    These are the prescriptions that you need to pick up.   You may get these medications from any pharmacy.         cloNIDine 0.2 mg/24hr patch   diclofenac sodium 1 % Gel   spironolactone 12.5 mg Tabs            Discharged Condition: improved    Consults:[facility]  PCCM  Significant Diagnostic Studies: Dg Chest 2 View  05/01/2011  *RADIOLOGY REPORT*  Clinical Data: Left side chest tightness and pain for 2 days, history diabetes, hypertension, coronary artery disease, CHF  CHEST - 2 VIEW  Comparison: 03/30/2011  Findings: Enlargement of cardiac silhouette. Tortuous aorta. Pulmonary vascularity normal. Right basilar atelectasis. Lungs otherwise clear. No pleural effusion or pneumothorax. Diffuse osseous demineralization.  IMPRESSION: Enlargement of cardiac silhouette. Right basilar atelectasis.  Original Report Authenticated By: Lollie Marrow, M.D.  Dg Hip Complete Right  04/10/2011  *RADIOLOGY REPORT*  Clinical Data: Right hip pain  RIGHT HIP - COMPLETE 2+ VIEW  Comparison: CT abdomen pelvis 09/17/2010  Findings: Right femoral head is located in the right acetabulum. No acute bony abnormalities identified.  There is slight subchondral sclerosis of both hips and slight joint space narrowing of the left hip.  No definite/significant joint space narrowing of the right hip appreciated.  There is sclerosis about both sacroiliac joints, along the mid and inferior aspects.  IMPRESSION:  1.  Sclerosis about both sacroiliac joints suggests bilateral sacroiliitis.  Question if this could be the cause of the patient's pain. 2.  Slight degenerative changes of the hips, left greater than right, not unexpected for patient age.  Original Report Authenticated By: Britta Mccreedy, M.D.   Dg Abd 1 View  05/02/2011  *RADIOLOGY REPORT*  Clinical Data: Left lower quadrant abdominal pain  ABDOMEN - 1 VIEW  Comparison: 02/04/2011   Findings: Nonobstructive bowel gas pattern.  Bilateral hip and lower lumbar degenerative changes.  Surgical clips right upper quadrant.  Atherosclerotic vascular calcification.  IMPRESSION: Nonobstructive bowel gas pattern.  Original Report Authenticated By: Waneta Martins, M.D.   Ct Head Wo Contrast  05/02/2011  *RADIOLOGY REPORT*  Clinical Data: 66 year old female with headache.  Earlier cardiac catheterization.  CT HEAD WITHOUT CONTRAST  Technique:  Contiguous axial images were obtained from the base of the skull through the vertex without contrast.  Comparison: 08/04/2005.  Findings: Visualized paranasal sinuses and mastoids are clear. Dural calcifications. No acute osseous abnormality identified. Visualized orbits and scalp soft tissues are within normal limits.  Stable cerebral volume.  No ventriculomegaly.  Patchy confluent cerebral white matter hypodensity has increased. No evidence of cortically based acute infarction identified.  No acute intracranial hemorrhage identified.  No midline shift, mass effect, or evidence of mass lesion.  No suspicious intracranial vascular hyperdensity.  IMPRESSION: 1. No acute intracranial abnormality. 2.  Progression of nonspecific cerebral white matter changes since 2007, favor chronic small vessel disease.  Original Report Authenticated By: Harley Hallmark, M.D.    Lab Results: Results for orders placed during the hospital encounter of 05/01/11 (from the past 48 hour(s))  GLUCOSE, CAPILLARY     Status: Abnormal   Collection Time   05/03/11  5:57 PM      Component Value Range Comment   Glucose-Capillary 153 (*) 70 - 99 (mg/dL)   GLUCOSE, CAPILLARY     Status: Abnormal   Collection Time   05/03/11  6:50 PM      Component Value Range Comment   Glucose-Capillary 148 (*) 70 - 99 (mg/dL)   GLUCOSE, CAPILLARY     Status: Abnormal   Collection Time   05/03/11  7:59 PM      Component Value Range Comment   Glucose-Capillary 138 (*) 70 - 99 (mg/dL)   GLUCOSE,  CAPILLARY     Status: Abnormal   Collection Time   05/03/11  9:37 PM      Component Value Range Comment   Glucose-Capillary 147 (*) 70 - 99 (mg/dL)   CBC     Status: Abnormal   Collection Time   05/04/11  5:05 AM      Component Value Range Comment   WBC 12.5 (*) 4.0 - 10.5 (K/uL)    RBC 4.29  3.87 - 5.11 (MIL/uL)    Hemoglobin 11.9 (*) 12.0 - 15.0 (g/dL)    HCT 16.1  09.6 - 04.5 (%)  MCV 85.1  78.0 - 100.0 (fL)    MCH 27.7  26.0 - 34.0 (pg)    MCHC 32.6  30.0 - 36.0 (g/dL)    RDW 16.1 (*) 09.6 - 15.5 (%)    Platelets 260  150 - 400 (K/uL)   BASIC METABOLIC PANEL     Status: Abnormal   Collection Time   05/04/11  5:05 AM      Component Value Range Comment   Sodium 134 (*) 135 - 145 (mEq/L)    Potassium 3.4 (*) 3.5 - 5.1 (mEq/L)    Chloride 94 (*) 96 - 112 (mEq/L)    CO2 27  19 - 32 (mEq/L)    Glucose, Bld 222 (*) 70 - 99 (mg/dL)    BUN 11  6 - 23 (mg/dL)    Creatinine, Ser 0.45  0.50 - 1.10 (mg/dL)    Calcium 9.3  8.4 - 10.5 (mg/dL)    GFR calc non Af Amer 87 (*) >90 (mL/min)    GFR calc Af Amer >90  >90 (mL/min)   MAGNESIUM     Status: Normal   Collection Time   05/04/11  5:05 AM      Component Value Range Comment   Magnesium 1.6  1.5 - 2.5 (mg/dL)   PHOSPHORUS     Status: Normal   Collection Time   05/04/11  5:05 AM      Component Value Range Comment   Phosphorus 2.6  2.3 - 4.6 (mg/dL)   GLUCOSE, CAPILLARY     Status: Abnormal   Collection Time   05/04/11  7:52 AM      Component Value Range Comment   Glucose-Capillary 236 (*) 70 - 99 (mg/dL)   GLUCOSE, CAPILLARY     Status: Abnormal   Collection Time   05/04/11 12:18 PM      Component Value Range Comment   Glucose-Capillary 212 (*) 70 - 99 (mg/dL)   GLUCOSE, CAPILLARY     Status: Abnormal   Collection Time   05/04/11  4:34 PM      Component Value Range Comment   Glucose-Capillary 274 (*) 70 - 99 (mg/dL)    Comment 1 Notify RN     GLUCOSE, CAPILLARY     Status: Abnormal   Collection Time   05/04/11  5:30 PM      Component  Value Range Comment   Glucose-Capillary 284 (*) 70 - 99 (mg/dL)   URINALYSIS, WITH MICROSCOPIC     Status: Abnormal   Collection Time   05/04/11  8:34 PM      Component Value Range Comment   Color, Urine YELLOW  YELLOW     APPearance CLEAR  CLEAR     Specific Gravity, Urine 1.012  1.005 - 1.030     pH 6.0  5.0 - 8.0     Glucose, UA 100 (*) NEGATIVE (mg/dL)    Hgb urine dipstick NEGATIVE  NEGATIVE     Bilirubin Urine NEGATIVE  NEGATIVE     Ketones, ur NEGATIVE  NEGATIVE (mg/dL)    Protein, ur 30 (*) NEGATIVE (mg/dL)    Urobilinogen, UA 0.2  0.0 - 1.0 (mg/dL)    Nitrite NEGATIVE  NEGATIVE     Leukocytes, UA TRACE (*) NEGATIVE     WBC, UA 0-2  <3 (WBC/hpf)    RBC / HPF 0-2  <3 (RBC/hpf)    Squamous Epithelial / LPF RARE  RARE     Casts HYALINE CASTS (*) NEGATIVE    URINE  CULTURE     Status: Normal   Collection Time   05/04/11  8:34 PM      Component Value Range Comment   Specimen Description URINE, CLEAN CATCH      Special Requests NONE      Culture  Setup Time [id number]      Colony Count NO GROWTH      Culture NO GROWTH      Report Status 05/05/2011 FINAL     GLUCOSE, CAPILLARY     Status: Abnormal   Collection Time   05/04/11  9:27 PM      Component Value Range Comment   Glucose-Capillary 146 (*) 70 - 99 (mg/dL)   D-DIMER, QUANTITATIVE     Status: Abnormal   Collection Time   05/04/11 10:25 PM      Component Value Range Comment   D-Dimer, Quant 0.50 (*) 0.00 - 0.48 (ug/mL-FEU)   CBC     Status: Abnormal   Collection Time   05/05/11  5:55 AM      Component Value Range Comment   WBC 7.4  4.0 - 10.5 (K/uL)    RBC 3.95  3.87 - 5.11 (MIL/uL)    Hemoglobin 10.9 (*) 12.0 - 15.0 (g/dL)    HCT 13.0 (*) 86.5 - 46.0 (%)    MCV 83.8  78.0 - 100.0 (fL)    MCH 27.6  26.0 - 34.0 (pg)    MCHC 32.9  30.0 - 36.0 (g/dL)    RDW 78.4 (*) 69.6 - 15.5 (%)    Platelets 215  150 - 400 (K/uL)   BASIC METABOLIC PANEL     Status: Abnormal   Collection Time   05/05/11  5:55 AM      Component Value  Range Comment   Sodium 130 (*) 135 - 145 (mEq/L)    Potassium 4.1  3.5 - 5.1 (mEq/L) DELTA CHECK NOTED   Chloride 94 (*) 96 - 112 (mEq/L)    CO2 27  19 - 32 (mEq/L)    Glucose, Bld 153 (*) 70 - 99 (mg/dL)    BUN 16  6 - 23 (mg/dL)    Creatinine, Ser 2.95  0.50 - 1.10 (mg/dL)    Calcium 8.9  8.4 - 10.5 (mg/dL)    GFR calc non Af Amer 56 (*) >90 (mL/min)    GFR calc Af Amer 65 (*) >90 (mL/min)   MAGNESIUM     Status: Normal   Collection Time   05/05/11  5:55 AM      Component Value Range Comment   Magnesium 2.4  1.5 - 2.5 (mg/dL)   PHOSPHORUS     Status: Normal   Collection Time   05/05/11  5:55 AM      Component Value Range Comment   Phosphorus 3.4  2.3 - 4.6 (mg/dL)   GLUCOSE, CAPILLARY     Status: Abnormal   Collection Time   05/05/11  7:33 AM      Component Value Range Comment   Glucose-Capillary 213 (*) 70 - 99 (mg/dL)    Comment 1 Notify RN     GLUCOSE, CAPILLARY     Status: Abnormal   Collection Time   05/05/11 10:46 AM      Component Value Range Comment   Glucose-Capillary 163 (*) 70 - 99 (mg/dL)   GLUCOSE, CAPILLARY     Status: Abnormal   Collection Time   05/05/11 11:16 AM      Component Value Range Comment   Glucose-Capillary 171 (*)  70 - 99 (mg/dL)    Comment 1 Notify RN      Recent Results (from the past 240 hour(s))  MRSA PCR SCREENING     Status: Normal   Collection Time   05/02/11 11:40 PM      Component Value Range Status Comment   MRSA by PCR NEGATIVE  NEGATIVE  Final   URINE CULTURE     Status: Normal   Collection Time   05/04/11  8:34 PM      Component Value Range Status Comment   Specimen Description URINE, CLEAN CATCH   Final    Special Requests NONE   Final    Culture  Setup Time [id number]   Final    Colony Count NO GROWTH   Final    Culture NO GROWTH   Final    Report Status 05/05/2011 FINAL   Final      Hospital Course:  CORONARY ARTERY DISEASE - patient presented with accelerated angina and she wa staken to the cardiac cath lab for evaluation.    The study found stable double vessel coronary disease with patent stents-moderate global LV dysfunction with an EF of 35%    Hypertensive emergency post cardiac cath.  On the night  April 30, The patient developed severe headache unresponsive to conventional treatments. At the same time she was having extremely elevated blood pressure readings. Head CT was done emergently and it did not indicate intracranial hemorrhage. The patient was transferred to the intensive care unit and started on clonidine patch and cardene drip. She also received multiple doses of labetalol intravenously as well as hydralazine. She was then transitioned to oral beta blocker with gradual improvement in headaches. The patient will followup with her primary cardiologist for further medications adjustment   The remainder of the patient's chronic medical problems have remained stable        Discharge Exam: Blood pressure 154/87, pulse 68, temperature 97.8 F (36.6 C), temperature source Oral, resp. rate 20, height 5\' 10"  (1.778 m), weight 98.975 kg (218 lb 3.2 oz), SpO2 100.00%. Patient Vitals for the past 24 hrs:  BP Temp Temp src Pulse Resp SpO2 Weight  05/05/11 1408 154/87 mmHg 97.8 F (36.6 C) Oral 68  20  100 % -  05/05/11 0805 - - - - - 100 % -  05/05/11 0500 150/77 mmHg 97.7 F (36.5 C) - 72  20  95 % 98.975 kg (218 lb 3.2 oz)  05/04/11 2100 163/89 mmHg 98.4 F (36.9 C) - 75  20  92 % -  05/04/11 2040 - - - - - 98 % -  05/04/11 1732 154/80 mmHg 97.4 F (36.3 C) Oral 75  16  99 % -     Disposition: home  Discharge Orders    Future Appointments: Provider: Department: Dept Phone: Center:   05/10/2011 11:00 AM Ashok Cordia, MD [facility] [phone] CPR     Future Orders Please Complete By Expires   Diet - low sodium heart healthy      Increase activity slowly         Follow-up Information    Schedule an appointment as soon as possible for a visit with [name], MD.   Contact  information:   [address], [address] [place] [address] [phone]          Signed: Lonia Blood 05/05/2011, 5:05 PM

## 2011-05-05 NOTE — Plan of Care (Signed)
Problem: Phase III Progression Outcomes Goal: Vascular site scale level 0 - I Vascular Site Scale Level 0: No bruising/bleeding/hematoma Level I (Mild): Bruising/Ecchymosis, minimal bleeding/ooozing, palpable hematoma < 3 cm Level II (Moderate): Bleeding not affecting hemodynamic parameters, pseudoaneurysm, palpable hematoma > 3 cm  Outcome: Completed/Met Date Met:  05/05/11 Right radial level (0)

## 2011-05-05 NOTE — Care Management Note (Addendum)
    Page 1 of 1   05/05/2011     3:05:01 PM   CARE MANAGEMENT NOTE 05/05/2011  Patient:  [name], [name]   Account Number:  [id number]  Date Initiated:  05/05/2011  Documentation initiated by:  Junius Creamer  Subjective/Objective Assessment:   adm w hypertension     Action/Plan:   lives w husband, pcp dr [name]   Anticipated DC Date:  05/05/2011   Anticipated DC Plan:  HOME W HOME HEALTH SERVICES      DC Planning Services  CM consult      [facility] Choice  HOME HEALTH  DURABLE MEDICAL EQUIPMENT   Choice offered to / List presented to:  C-1 Patient   DME arranged  CANE      DME agency  [facility]     HH arranged  HH-1 RN  HH-2 PT      [facility] agency  [facility]   Status of service:   Medicare Important Message given?   (If response is "NO", the following Medicare IM given date fields will be blank) Date Medicare IM given:   Date Additional Medicare IM given:    Discharge Disposition:  HOME W HOME HEALTH SERVICES  Per UR Regulation:    If discussed at Long Length of Stay Meetings, dates discussed:    Comments:  05/05/11 15:00 debbie Monae Topping rn,bsn spoke w pt. she and md have decided she needs hhrn and phy ther and quad cane. no pref to hhc agency. ref to [facility] for quad cane, ref to [facility] for rn and phy ther. ref also in tlc. copy of hhc agency list in chart.  05/05/11 11:03a debbie Siobhan Zaro rn,bsn [phone] spoke w pt and husband. not sure of dc needs. did leave pt w hhc agency list.

## 2011-05-05 NOTE — Discharge Instructions (Signed)
[facility] [phone] rn, phy there, quad cane

## 2011-05-05 NOTE — Progress Notes (Signed)
Subjective: No HA.  No CP Objective: Filed Vitals:   05/04/11 2040 05/04/11 2100 05/05/11 0500 05/05/11 0805  BP:  163/89 150/77   Pulse:  75 72   Temp:  98.4 F (36.9 C) 97.7 F (36.5 C)   TempSrc:      Resp:  20 20   Height:      Weight:   218 lb 3.2 oz (98.975 kg)   SpO2: 98% 92% 95% 100%   Weight change: 18 lb 10.9 oz (8.475 kg)  Intake/Output Summary (Last 24 hours) at 05/05/11 1026 Last data filed at 05/04/11 1700  Gross per 24 hour  Intake    840 ml  Output    300 ml  Net    540 ml    General: Alert, awake, oriented x3, in no acute distress Neck:  JVP is normal Heart: Regular rate and rhythm, without murmurs, rubs, gallops.  Lungs: Clear to auscultation.  No rales or wheezes. Exemities:  No edema.   Neuro: Grossly intact, nonfocal.   Lab Results: Results for orders placed during the hospital encounter of 05/01/11 (from the past 24 hour(s))  GLUCOSE, CAPILLARY     Status: Abnormal   Collection Time   05/04/11 12:18 PM      Component Value Range   Glucose-Capillary 212 (*) 70 - 99 (mg/dL)  GLUCOSE, CAPILLARY     Status: Abnormal   Collection Time   05/04/11  4:34 PM      Component Value Range   Glucose-Capillary 274 (*) 70 - 99 (mg/dL)   Comment 1 Notify RN    GLUCOSE, CAPILLARY     Status: Abnormal   Collection Time   05/04/11  5:30 PM      Component Value Range   Glucose-Capillary 284 (*) 70 - 99 (mg/dL)  URINALYSIS, WITH MICROSCOPIC     Status: Abnormal   Collection Time   05/04/11  8:34 PM      Component Value Range   Color, Urine YELLOW  YELLOW    APPearance CLEAR  CLEAR    Specific Gravity, Urine 1.012  1.005 - 1.030    pH 6.0  5.0 - 8.0    Glucose, UA 100 (*) NEGATIVE (mg/dL)   Hgb urine dipstick NEGATIVE  NEGATIVE    Bilirubin Urine NEGATIVE  NEGATIVE    Ketones, ur NEGATIVE  NEGATIVE (mg/dL)   Protein, ur 30 (*) NEGATIVE (mg/dL)   Urobilinogen, UA 0.2  0.0 - 1.0 (mg/dL)   Nitrite NEGATIVE  NEGATIVE    Leukocytes, UA TRACE (*) NEGATIVE    WBC,  UA 0-2  <3 (WBC/hpf)   RBC / HPF 0-2  <3 (RBC/hpf)   Squamous Epithelial / LPF RARE  RARE    Casts HYALINE CASTS (*) NEGATIVE   GLUCOSE, CAPILLARY     Status: Abnormal   Collection Time   05/04/11  9:27 PM      Component Value Range   Glucose-Capillary 146 (*) 70 - 99 (mg/dL)  D-DIMER, QUANTITATIVE     Status: Abnormal   Collection Time   05/04/11 10:25 PM      Component Value Range   D-Dimer, Quant 0.50 (*) 0.00 - 0.48 (ug/mL-FEU)  CBC     Status: Abnormal   Collection Time   05/05/11  5:55 AM      Component Value Range   WBC 7.4  4.0 - 10.5 (K/uL)   RBC 3.95  3.87 - 5.11 (MIL/uL)   Hemoglobin 10.9 (*) 12.0 - 15.0 (g/dL)  HCT 33.1 (*) 36.0 - 46.0 (%)   MCV 83.8  78.0 - 100.0 (fL)   MCH 27.6  26.0 - 34.0 (pg)   MCHC 32.9  30.0 - 36.0 (g/dL)   RDW 16.1 (*) 09.6 - 15.5 (%)   Platelets 215  150 - 400 (K/uL)  BASIC METABOLIC PANEL     Status: Abnormal   Collection Time   05/05/11  5:55 AM      Component Value Range   Sodium 130 (*) 135 - 145 (mEq/L)   Potassium 4.1  3.5 - 5.1 (mEq/L)   Chloride 94 (*) 96 - 112 (mEq/L)   CO2 27  19 - 32 (mEq/L)   Glucose, Bld 153 (*) 70 - 99 (mg/dL)   BUN 16  6 - 23 (mg/dL)   Creatinine, Ser 0.45  0.50 - 1.10 (mg/dL)   Calcium 8.9  8.4 - 40.9 (mg/dL)   GFR calc non Af Amer 56 (*) >90 (mL/min)   GFR calc Af Amer 65 (*) >90 (mL/min)  MAGNESIUM     Status: Normal   Collection Time   05/05/11  5:55 AM      Component Value Range   Magnesium 2.4  1.5 - 2.5 (mg/dL)  PHOSPHORUS     Status: Normal   Collection Time   05/05/11  5:55 AM      Component Value Range   Phosphorus 3.4  2.3 - 4.6 (mg/dL)  GLUCOSE, CAPILLARY     Status: Abnormal   Collection Time   05/05/11  7:33 AM      Component Value Range   Glucose-Capillary 213 (*) 70 - 99 (mg/dL)   Comment 1 Notify RN      Studies/Results: No results found.  Medications: I have reviewed the patient's current medications.   Patient Active Hospital Problem List:  Hypertensive emergency (05/03/2011)    Assessment: BP is better though still a elevated  HA gone. Add low dose aldactone  Watch labs.\  CORONARY ARTERY DISEASE (07/09/2000)   Assessment: Cath on 4/30 with 20% LM; 99% distal LAD  Small vessel.  Unchanged from 2010.  RCA with mild instent restensosis of mid and distal stents.  No critical lesions.  LVEF 35% with LVEDP 7 mm Hg.     Recomm continued coreg, avapro, lasix, clonidine.  WOuld add 12.5 aldactone.  Watch Na, K, Cr.  CONGESTIVE HEART FAILURE (12/11/2005)   Assessment: As noted above.    OSA (obstructive sleep apnea) (05/03/2011)   Assessment: CPAP    LOS: 4 days   Dietrich Pates 05/05/2011, 10:26 AM

## 2011-05-05 NOTE — Progress Notes (Signed)
Inpatient Diabetes Program Recommendations  AACE/ADA: New Consensus Statement on Inpatient Glycemic Control (2009)  Target Ranges:  Prepandial:   less than 140 mg/dL      Peak postprandial:   less than 180 mg/dL (1-2 hours)      Critically ill patients:  140 - 180 mg/dL   Inpatient Diabetes Program Recommendations Insulin - Basal: . Correction (SSI): . Insulin - Meal Coverage: Add Novolog 4-6 units with meals HgbA1C: .  Thank you  [name] [facility] Inpatient Diabetes Coordinator [phone]

## 2011-05-08 ENCOUNTER — Encounter: Payer: Medicare Other | Admitting: Rehabilitative and Restorative Service Providers"

## 2011-05-09 ENCOUNTER — Telehealth: Payer: Self-pay | Admitting: Cardiology

## 2011-05-09 ENCOUNTER — Telehealth: Payer: Self-pay | Admitting: Physical Medicine and Rehabilitation

## 2011-05-09 ENCOUNTER — Other Ambulatory Visit: Payer: Self-pay | Admitting: *Deleted

## 2011-05-09 NOTE — Telephone Encounter (Signed)
Pt aware that her message was received and that it will be sent to Dr. Pamelia Hoit.

## 2011-05-09 NOTE — Telephone Encounter (Signed)
Please return call to patient at [phone].  Patient wants to know what medication she can taken for the pain in her shoulder & Arm.

## 2011-05-09 NOTE — Telephone Encounter (Signed)
Pt calling stating she is having the same type of pain in her neck shoulder and arm that she was having in the hospital.  She would like Dr Antoine Poche at call her in something for pain.  I explained to pt that Dr Antoine Poche does not RX pain medications and she should contact hr PCP.  She then stated that she has been being [facility] in [facility] and she will call there.

## 2011-05-09 NOTE — Telephone Encounter (Signed)
In terrible pain.  Can you please order some kind of pain medicine?

## 2011-05-10 ENCOUNTER — Encounter: Payer: Medicare Other | Admitting: Rehabilitative and Restorative Service Providers"

## 2011-05-10 ENCOUNTER — Other Ambulatory Visit: Payer: Self-pay

## 2011-05-10 ENCOUNTER — Encounter
Payer: Medicare Other | Attending: Physical Medicine and Rehabilitation | Admitting: Physical Medicine and Rehabilitation

## 2011-05-10 DIAGNOSIS — M542 Cervicalgia: Secondary | ICD-10-CM | POA: Insufficient documentation

## 2011-05-10 MED ORDER — CARVEDILOL 25 MG PO TABS: 37.5000 mg | ORAL_TABLET | Freq: Two times a day (BID) | ORAL | Status: DC

## 2011-05-10 NOTE — Telephone Encounter (Signed)
..   Requested Prescriptions   Signed Prescriptions Disp Refills  . carvedilol (COREG) 25 MG tablet 30 tablet 10    Sig: Take 1.5 tablets (37.5 mg total) by mouth 2 (two) times daily with a meal. Take one and one half tablet twice daily. PT WANTS BRAND ONLY    Authorizing Provider: [name]    Ordering User: Christella Hartigan, Eyana Stolze Judie Petit

## 2011-05-10 NOTE — Telephone Encounter (Signed)
Patient did not come to her followup appointment today. Please call her to reschedule 

## 2011-05-10 NOTE — Telephone Encounter (Signed)
Patient did not come to her followup appointment today. Please call her to reschedule

## 2011-05-11 ENCOUNTER — Encounter: Payer: Medicare Other | Admitting: Rehabilitative and Restorative Service Providers"

## 2011-05-11 NOTE — Telephone Encounter (Signed)
April please follow up.

## 2011-05-15 ENCOUNTER — Encounter (HOSPITAL_COMMUNITY): Payer: Self-pay | Admitting: Emergency Medicine

## 2011-05-15 ENCOUNTER — Emergency Department (HOSPITAL_COMMUNITY): Payer: Medicare Other

## 2011-05-15 ENCOUNTER — Telehealth: Payer: Self-pay | Admitting: Gastroenterology

## 2011-05-15 ENCOUNTER — Emergency Department (HOSPITAL_COMMUNITY)
Admission: EM | Admit: 2011-05-15 | Discharge: 2011-05-16 | Disposition: A | Discharge status: Home / Self Care | Payer: Medicare Other | Attending: Emergency Medicine | Admitting: Emergency Medicine

## 2011-05-15 DIAGNOSIS — I2510 Atherosclerotic heart disease of native coronary artery without angina pectoris: Secondary | ICD-10-CM | POA: Insufficient documentation

## 2011-05-15 DIAGNOSIS — I10 Essential (primary) hypertension: Secondary | ICD-10-CM | POA: Insufficient documentation

## 2011-05-15 DIAGNOSIS — K5900 Constipation, unspecified: Secondary | ICD-10-CM | POA: Insufficient documentation

## 2011-05-15 DIAGNOSIS — R112 Nausea with vomiting, unspecified: Secondary | ICD-10-CM | POA: Insufficient documentation

## 2011-05-15 DIAGNOSIS — R10819 Abdominal tenderness, unspecified site: Secondary | ICD-10-CM | POA: Insufficient documentation

## 2011-05-15 DIAGNOSIS — E119 Type 2 diabetes mellitus without complications: Secondary | ICD-10-CM | POA: Insufficient documentation

## 2011-05-15 DIAGNOSIS — Z9089 Acquired absence of other organs: Secondary | ICD-10-CM | POA: Insufficient documentation

## 2011-05-15 DIAGNOSIS — F411 Generalized anxiety disorder: Secondary | ICD-10-CM | POA: Insufficient documentation

## 2011-05-15 DIAGNOSIS — Z794 Long term (current) use of insulin: Secondary | ICD-10-CM | POA: Insufficient documentation

## 2011-05-15 DIAGNOSIS — R109 Unspecified abdominal pain: Secondary | ICD-10-CM | POA: Insufficient documentation

## 2011-05-15 DIAGNOSIS — K6289 Other specified diseases of anus and rectum: Secondary | ICD-10-CM | POA: Insufficient documentation

## 2011-05-15 LAB — COMPREHENSIVE METABOLIC PANEL
ALT: 11 U/L (ref 0–35)
AST: 15 U/L (ref 0–37)
Albumin: 3.4 g/dL — ABNORMAL LOW (ref 3.5–5.2)
Alkaline Phosphatase: 85 U/L (ref 39–117)
BUN: 10 mg/dL (ref 6–23)
CO2: 25 mEq/L (ref 19–32)
Calcium: 9.3 mg/dL (ref 8.4–10.5)
Chloride: 97 mEq/L (ref 96–112)
Creatinine, Ser: 0.9 mg/dL (ref 0.50–1.10)
GFR calc Af Amer: 75 mL/min — ABNORMAL LOW (ref >90)
GFR calc non Af Amer: 65 mL/min — ABNORMAL LOW (ref >90)
Glucose, Bld: 241 mg/dL — ABNORMAL HIGH (ref 70–99)
Potassium: 4.6 mEq/L (ref 3.5–5.1)
Sodium: 132 mEq/L — ABNORMAL LOW (ref 135–145)
Total Bilirubin: 0.1 mg/dL — ABNORMAL LOW (ref 0.3–1.2)
Total Protein: 6.7 g/dL (ref 6.0–8.3)

## 2011-05-15 LAB — URINALYSIS, ROUTINE W REFLEX MICROSCOPIC
Bilirubin Urine: NEGATIVE
Glucose, UA: NEGATIVE mg/dL
Hgb urine dipstick: NEGATIVE
Ketones, ur: NEGATIVE mg/dL
Leukocytes, UA: NEGATIVE
Nitrite: NEGATIVE
Protein, ur: NEGATIVE mg/dL
Specific Gravity, Urine: 1.004 — ABNORMAL LOW (ref 1.005–1.030)
Urobilinogen, UA: 0.2 mg/dL (ref 0.0–1.0)
pH: 6.5 (ref 5.0–8.0)

## 2011-05-15 LAB — DIFFERENTIAL
Basophils Absolute: 0 10*3/uL (ref 0.0–0.1)
Basophils Relative: 0 % (ref 0–1)
Eosinophils Absolute: 0.2 10*3/uL (ref 0.0–0.7)
Eosinophils Relative: 2 % (ref 0–5)
Lymphocytes Relative: 31 % (ref 12–46)
Lymphs Abs: 2.7 10*3/uL (ref 0.7–4.0)
Monocytes Absolute: 0.4 10*3/uL (ref 0.1–1.0)
Monocytes Relative: 5 % (ref 3–12)
Neutro Abs: 5.5 10*3/uL (ref 1.7–7.7)
Neutrophils Relative %: 63 % (ref 43–77)

## 2011-05-15 LAB — CBC
HCT: 32.7 % — ABNORMAL LOW (ref 36.0–46.0)
Hemoglobin: 10.9 g/dL — ABNORMAL LOW (ref 12.0–15.0)
MCH: 28 pg (ref 26.0–34.0)
MCHC: 33.3 g/dL (ref 30.0–36.0)
MCV: 84.1 fL (ref 78.0–100.0)
Platelets: 297 10*3/uL (ref 150–400)
RBC: 3.89 MIL/uL (ref 3.87–5.11)
RDW: 15.8 % — ABNORMAL HIGH (ref 11.5–15.5)
WBC: 8.8 10*3/uL (ref 4.0–10.5)

## 2011-05-15 MED ORDER — LUBIPROSTONE 24 MCG PO CAPS: 24.0000 ug | ORAL_CAPSULE | Freq: Two times a day (BID) | ORAL | Status: DC

## 2011-05-15 MED ORDER — IOHEXOL 300 MG/ML  SOLN
40.0000 mL | Freq: Once | INTRAMUSCULAR | Status: AC | PRN
  Administered 2011-05-15: 40 mL via ORAL

## 2011-05-15 MED ORDER — IOHEXOL 300 MG/ML  SOLN
100.0000 mL | Freq: Once | INTRAMUSCULAR | Status: AC | PRN
  Administered 2011-05-15: 100 mL via INTRAVENOUS

## 2011-05-15 MED ORDER — SODIUM CHLORIDE 0.9 % IV BOLUS (SEPSIS)
500.0000 mL | Freq: Once | INTRAVENOUS | Status: AC
  Administered 2011-05-15: 500 mL via INTRAVENOUS

## 2011-05-15 NOTE — ED Provider Notes (Signed)
Medical screening examination/treatment/procedure(s) were conducted as a shared visit with non-physician practitioner(s) and myself.  I personally evaluated the patient during the encounter  She has chronic constipation. Her GI doctor called in medication for constipation, but she has not taken yet. She complains of prolapsing hemorrhoids. She denies fever. She is anxious Patient is anxious. Abdomen is soft, nontender. Anus is without external hemorrhoids  Flint Melter, MD 05/16/11 1858

## 2011-05-15 NOTE — ED Provider Notes (Signed)
History     CSN: [id number]  Arrival date & time 05/15/11  1524   First MD Initiated Contact with Patient 05/15/11 2108      Chief Complaint  Patient presents with  . Constipation    (Consider location/radiation/quality/duration/timing/severity/associated sxs/prior treatment) HPI Comments: Patient comes in today with a chief complaint of lower abdominal pain that started this morning and also constipation.  She describes the pain as a crampy, bloating type pain.  She reports that she has not been having regular bowel movements over the past month.  She reports that her last normal bowel movement was one month ago.  Last evening she took Miralax and Dulcolax for constipation and had little results.  She is also concerned that she has prolapsed hemorrhoids.  She feels that her hemorrhoids came out today when she was having a bowel movement.  She had one episode of vomiting last evening.  No vomiting since that time.  She has seen GI for her constipation in the past and was given a prescription, which she has not started taking yet.    Patient is a 66 y.o. female presenting with constipation. The history is provided by the patient.  Constipation  Associated symptoms include abdominal pain, hemorrhoids, nausea, rectal pain and vomiting. Pertinent negatives include no fever, no diarrhea, no hematemesis, no hematuria, no vaginal bleeding, no chest pain, no headaches and no coughing.    Past Medical History  Diagnosis Date  . DM   . DYSLIPIDEMIA   . OBESITY NOS   . DEPRESSION, RECURRENT, IN PARTIAL REMISSION   . HYPERTENSION   . 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 CONGESTIVE HEART FAILURE     Cath 05/2008 with non obstructive CAD but global hypokinesis, EF 30-35%  . GERD   . DIVERTICULOSIS, COLON, WITH HEMORRHAGE   . BACK PAIN, LUMBAR, WITH RADICULOPATHY   . ABNORMAL ELECTROCARDIOGRAM   . OBSTRUCTIVE SLEEP APNEA   . Asthma     Past Surgical  History  Procedure Date  . Uvuloplasty   . Total abdominal hysterectomy w/ bilateral salpingoophorectomy   . Abdominal hysterectomy   . Cholecystectomy     Family History  Problem Relation Age of Onset  . Stroke Mother   . Breast cancer Sister   . Emphysema Sister   . Cancer Sister     breast  . Alcohol abuse Brother   . Colon cancer      uncles x 2  . Prostate cancer Brother     History  Substance Use Topics  . Smoking status: Current Some Day Smoker -- 1.0 packs/day for 20 years    Types: Cigarettes    Last Attempt to Quit: 01/03/2007  . Smokeless tobacco: Never Used  . Alcohol Use: No    OB History    Grav Para Term Preterm Abortions TAB SAB Ect Mult Living                  Review of Systems  Constitutional: Negative for fever and chills.  Respiratory: Negative for cough, shortness of breath and wheezing.   Cardiovascular: Negative for chest pain.  Gastrointestinal: Positive for nausea, vomiting, abdominal pain, constipation, blood in stool, rectal pain and hemorrhoids. Negative for diarrhea and hematemesis.  Genitourinary: Negative for dysuria, frequency, hematuria, flank pain, decreased urine volume, vaginal bleeding, difficulty urinating, vaginal pain and pelvic pain.  Neurological: Negative for dizziness, syncope, light-headedness and headaches.  Allergies  Amlodipine; Ramipril; Adhesive; Penicillins; and Codeine  Home Medications   Current Outpatient Rx  Name Route Sig Dispense Refill  . ACETAMINOPHEN ER 650 MG PO TBCR Oral Take 1,300 mg by mouth every 8 (eight) hours as needed. For pain    . ASPIRIN 81 MG PO TABS Oral Take 81 mg by mouth daily.      . ATORVASTATIN CALCIUM 40 MG PO TABS Oral Take 1 tablet (40 mg total) by mouth daily. 90 tablet 3  . BISACODYL 5 MG PO TBEC Oral Take 10 mg by mouth at bedtime as needed. For constipation    . CARVEDILOL 25 MG PO TABS Oral Take 1.5 tablets (37.5 mg total) by mouth 2 (two) times daily with a meal. Take one  and one half tablet twice daily. PT WANTS BRAND ONLY 30 tablet 10    Name brand only  . CLONIDINE HCL 0.2 MG/24HR TD PTWK Transdermal Place 1 patch onto the skin once a week. sunday    . DICLOFENAC SODIUM 1 % TD GEL Topical Apply 1 application topically 4 (four) times daily as needed. For swelling and pain    . FUROSEMIDE 80 MG PO TABS Oral Take 80 mg by mouth daily.    . INSULIN ASPART 100 UNIT/ML [place] SOLN Subcutaneous Inject 10-15 Units into the skin 3 (three) times daily before meals. Sliding scale.    15 units at breakfast, 10units at lunch,10 units at dinner    . INSULIN GLARGINE 100 UNIT/ML [place] SOLN Subcutaneous Inject 10-20 Units into the skin at bedtime. Per sliding scale    . LUBIPROSTONE 24 MCG PO CAPS Oral Take 1 capsule (24 mcg total) by mouth 2 (two) times daily with a meal. 60 capsule 3  . MOMETASONE FURO-FORMOTEROL FUM 100-5 MCG/ACT IN AERO Inhalation Inhale 2 puffs into the lungs 2 (two) times daily.    [name] OLMESARTAN MEDOXOMIL 40 MG PO TABS Oral Take 40 mg by mouth daily.    [name] PANTOPRAZOLE SODIUM 40 MG PO TBEC Oral Take 40 mg by mouth 2 (two) times daily.    [name] MIRALAX PO Oral Take 17 g by mouth daily. As directed     . POTASSIUM CHLORIDE 10 MEQ PO CPCR Oral Take 10 mEq by mouth daily.      [name] SITAGLIPTIN-METFORMIN HCL ER 50-1000 MG PO TB24 Oral Take 1 tablet by mouth daily. 112 tablet 0  . SPIRONOLACTONE 12.5 MG HALF TABLET Oral Take 0.5 tablets (12.5 mg total) by mouth daily. 20 tablet 0  . VENLAFAXINE HCL ER 150 MG PO CP24 Oral Take 150 mg by mouth 2 (two) times daily.        BP 159/89  Pulse 105  Temp(Src) 98.3 F (36.8 C) (Oral)  Resp 18  SpO2 98%  Physical Exam  Nursing note and vitals reviewed. Constitutional: She appears well-developed and well-nourished. No distress.  HENT:  Head: Normocephalic and atraumatic.  Mouth/Throat: Oropharynx is clear and moist.  Neck: Normal range of motion. Neck supple.  Cardiovascular: Normal rate, regular rhythm and normal heart  sounds.   Pulmonary/Chest: Effort normal and breath sounds normal.  Abdominal: Soft. Bowel sounds are normal. She exhibits no distension and no mass. There is tenderness in the suprapubic area. There is no rebound and no guarding.  Genitourinary: Rectal exam shows no external hemorrhoid, no internal hemorrhoid, no fissure, no mass, no tenderness and anal tone normal.  Neurological: She is alert.  Skin: Skin is warm and dry. She is not diaphoretic.  Psychiatric: She has a normal mood and affect.    ED Course  Procedures (including critical care time)  Labs Reviewed  CBC - Abnormal; Notable for the following:    Hemoglobin 10.9 (*)    HCT 32.7 (*)    RDW 15.8 (*)    All other components within normal limits  COMPREHENSIVE METABOLIC PANEL - Abnormal; Notable for the following:    Sodium 132 (*)    Glucose, Bld 241 (*)    Albumin 3.4 (*)    Total Bilirubin 0.1 (*)    GFR calc non Af Amer 65 (*)    GFR calc Af Amer 75 (*)    All other components within normal limits  URINALYSIS, ROUTINE W REFLEX MICROSCOPIC - Abnormal; Notable for the following:    Specific Gravity, Urine 1.004 (*)    All other components within normal limits  GLUCOSE, CAPILLARY - Abnormal; Notable for the following:    Glucose-Capillary 231 (*)    All other components within normal limits  DIFFERENTIAL   Dg Abd Acute W/chest  05/15/2011  *RADIOLOGY REPORT*  Clinical Data: Diverticulitis with abdominal pain and constipation. History of diabetes and hypertension.  ACUTE ABDOMEN SERIES (ABDOMEN 2 VIEW & CHEST 1 VIEW)  Comparison: Chest radiographs 05/01/2011.  One-view abdomen 05/02/2011.  Findings: Mild cardiomegaly appears stable.  The overall pulmonary aeration has improved.  There is no pleural effusion.  The patient appears to have a right cervical rib.  The bowel gas pattern is normal.  There is no free intraperitoneal air or significantly increased colonic stool burden.  There are no suspicious abdominal  calcifications.  Degenerative changes of the lower lumbar spine and hips are noted.  There are cholecystectomy clips.  IMPRESSION:  1.  No acute cardiopulmonary or abdominal process identified. 2.  Stable cardiomegaly.  Original Report Authenticated By: Gerrianne Scale, M.D.     No diagnosis found.    MDM  Patient comes in today with a chief complaint of chronic constipated and bloated cramping abdominal pain.  Patient in no acute distress.  Afebrile.  Ct scan negative.  No hemorrhoids visualized or palpated on exam.  Patient instructed to start taking the medication that GI prescribed for constipation.          Pascal Lux [place], PA-C 05/16/11 1645

## 2011-05-15 NOTE — ED Notes (Signed)
cbg reads 231 

## 2011-05-15 NOTE — ED Notes (Addendum)
Pt reports feeling impacted since yesterday; reports having loose stools, but feels like there is more stool; has tried mira lax, dulcolax, with no relief; pt crying at triage; pt reports feeling bloated and also reports decreased urination; c/o pain in mid abd and reports lower back pain earlier today

## 2011-05-15 NOTE — Telephone Encounter (Signed)
Pt advised she has had constipation and hemorrhoids and wanted to be seen,  She has not been using miralax, dulcolax or amitiza as directed at last office visit.  I sent in a refill on amitiza and advised pt to start miralax twice daily and use the amitiza and dulcolax.  ROV was scheduled

## 2011-05-15 NOTE — ED Notes (Signed)
Patient currently drinking contrast for CT

## 2011-05-15 NOTE — ED Notes (Signed)
Phoned CT to inform them that patient is finished drinking oral contrast

## 2011-05-15 NOTE — ED Notes (Signed)
Patient transported to CT 

## 2011-05-16 MED ORDER — TRAMADOL HCL 50 MG PO TABS
ORAL_TABLET | ORAL | Status: AC
  Filled 2011-05-16: qty 1

## 2011-05-16 MED ORDER — TRAMADOL HCL 50 MG PO TABS
50.0000 mg | ORAL_TABLET | Freq: Once | ORAL | Status: AC
  Administered 2011-05-16: 50 mg via ORAL

## 2011-05-16 NOTE — Discharge Instructions (Signed)
Follow up with your primary care physician.  Abdominal Pain  Your exam might not show the exact reason you have abdominal pain. Since there are many different causes of abdominal pain, another checkup and more tests may be needed. It is very important to follow up for lasting (persistent) or worsening symptoms. A possible cause of abdominal pain in any person who still has his or her appendix is acute appendicitis. Appendicitis is often hard to diagnose. Normal blood tests, urine tests, ultrasound, and CT scans do not completely rule out early appendicitis or other causes of abdominal pain. Sometimes, only the changes that happen over time will allow appendicitis and other causes of abdominal pain to be determined. Other potential problems that may require surgery may also take time to become more apparent. Because of this, it is important that you follow all of the instructions below.   HOME CARE INSTRUCTIONS  Do not take laxatives unless directed by your caregiver. Rest as much as possible.  Do not eat solid food until your pain is gone: A diet of water, weak decaffeinated tea, broth or bouillon, gelatin, oral rehydration solutions (ORS), frozen ice pops, or ice chips may be helpful.  When pain is gone: Start a light diet (dry toast, crackers, applesauce, or white rice). Increase the diet slowly as long as it does not bother you. Eat no dairy products (including cheese and eggs) and no spicy, fatty, fried, or high-fiber foods.  Use no alcohol, caffeine, or cigarettes.  Take your regular medicines unless your caregiver told you not to.  Take any prescribed medicine as directed.   SEEK IMMEDIATE MEDICAL CARE IF:  The pain does not go away.  You have a fever >101 that persists You keep throwing up (vomiting) or cannot drink liquids.  The pain becomes localized (Pain in the right side could possibly be appendicitis. In an adult, pain in the left lower portion of the abdomen could be colitis or  diverticulitis). You pass bloody or black tarry stools.  You have shaking chills.  There is blood in your vomit or you see blood in your bowel movements.  Your bowel movements stop (become blocked) or you cannot pass gas.  You have bloody, frequent, or painful urination.  You have yellow discoloration in the skin or whites of the eyes.  Your stomach becomes bloated or bigger.  You have dizziness or fainting.  You have chest or back pain.

## 2011-05-16 NOTE — ED Notes (Signed)
Pt came to the ED for constipation. Received report from [facility]. No cardiac or respiratory distress. No neurological deficits. Pt is currently not having any pain. Will continue to monitor.

## 2011-05-19 ENCOUNTER — Encounter: Payer: Self-pay | Admitting: Physical Medicine and Rehabilitation

## 2011-05-19 ENCOUNTER — Encounter (HOSPITAL_BASED_OUTPATIENT_CLINIC_OR_DEPARTMENT_OTHER): Payer: Medicare Other | Admitting: Physical Medicine and Rehabilitation

## 2011-05-19 VITALS — BP 148/72 | HR 77 | Resp 16 | Ht 69.5 in | Wt 219.6 lb

## 2011-05-19 DIAGNOSIS — G5621 Lesion of ulnar nerve, right upper limb: Secondary | ICD-10-CM

## 2011-05-19 DIAGNOSIS — M792 Neuralgia and neuritis, unspecified: Secondary | ICD-10-CM

## 2011-05-19 DIAGNOSIS — M5412 Radiculopathy, cervical region: Secondary | ICD-10-CM

## 2011-05-19 DIAGNOSIS — M4802 Spinal stenosis, cervical region: Secondary | ICD-10-CM

## 2011-05-19 DIAGNOSIS — M545 Low back pain, unspecified: Secondary | ICD-10-CM

## 2011-05-19 DIAGNOSIS — G8929 Other chronic pain: Secondary | ICD-10-CM

## 2011-05-19 DIAGNOSIS — Z87898 Personal history of other specified conditions: Secondary | ICD-10-CM

## 2011-05-19 DIAGNOSIS — M542 Cervicalgia: Secondary | ICD-10-CM

## 2011-05-19 DIAGNOSIS — R29898 Other symptoms and signs involving the musculoskeletal system: Secondary | ICD-10-CM

## 2011-05-19 DIAGNOSIS — G5620 Lesion of ulnar nerve, unspecified upper limb: Secondary | ICD-10-CM

## 2011-05-19 MED ORDER — GABAPENTIN 100 MG PO CAPS: 100.0000 mg | ORAL_CAPSULE | Freq: Three times a day (TID) | ORAL | Status: DC

## 2011-05-19 NOTE — Progress Notes (Signed)
Subjective:    Patient ID: [name], female    DOB: Aug 16, 1945, 66 y.o.   MRN: [id number]  HPI  The patient is a 66 year old African American woman who has been referred by Dr. [name].  She reports her chief complaint is neck and right upper extremity pain.  She also reports some low back pain with occasional radiation to her right leg.  She reports that her neck pain came on gradually about 6 months ago possibly a little longer. She denies any history of falls or accidents or trauma. She really cannot think of anything that brought this on. Pain radiates from her neck to her right hand. She reports this pain is constant  Her neck pain is worse with movement.  She tells me her pain varies and some days her low back is worse than her neck, however today her neck is the worst problem addition to the arm.  She reports about a 6 month history of right hand tingling as well. She reports a history of Right carpal tunnel surgery approximately 12 years ago.     Since she was last seen she's been seen in the ED for constipation,hyperglycemia, chestpain.       Pain Inventory Average Pain 8 Pain Right Now 7 My pain is sharp and tingling  In the last 24 hours, has pain interfered with the following? General activity 10 Relation with others 5 Enjoyment of life 0 What TIME of day is your pain at its worst? all of the time Sleep (in general) Fair uses c-pap  Pain is worse with: walking, sitting, standing and some activites Pain improves with: rest and therapy/exercise Relief from Meds: 0  Mobility use a cane how many minutes can you walk? 15 ability to climb steps?  yes do you drive?  yes  Function retired I need assistance with the following:  dressing, bathing, meal prep and household duties  Neuro/Psych weakness numbness trouble walking  Prior Studies Any changes since last visit?  no  Physicians involved in your care Any changes since last visit?   no     Review of Systems  Constitutional: Positive for diaphoresis.       Night sweats, alt high low blood sugar,poor appetite  Respiratory: Positive for apnea.        Sleep apnea  Gastrointestinal: Positive for constipation.  Genitourinary: Positive for difficulty urinating.  All other systems reviewed and are negative.       Objective:   Physical Exam  Patient is a well-developed well-nourished mildly obese woman who appears her stated age and does not appear in any distress she is oriented x3 speech is clear affect is bright she's alert cooperative and pleasant , answers the question appropriately cranial nerves and coordination are intact.    Reflexes are diminished in both upper and lower extremities there's no abnormal tone clonus or tremors    Hoffman Sign is negative    She has good strength in both upper and lower extremities without obvious focal deficit   She's decreased sensation and median distribution on the right     She has decreased sensation below the knees bilaterally, decreased vibratory senses noted as well    She transitions relatively easily from sitting to standing   gait is stable none antalgic    Difficulty as noted with tandem gait as well as Romberg test  .  well preserved range of motion is noted in her neck and shoulders.   Forward flexion of  the lumbar spine does not aggravate pain however extension does exacerbate    Tenderness is noted over the right sacroiliac joint   Internal and external rotation of the right hip also aggravate posterior hip pain       Assessment & Plan:  1. Cervicalgia           MRI cervical 03/02/11           1. Cervical spondylosis and degenerative disc disease results and           very degrees of foraminal impingement at all levels between C2 and            C7.            2. Despite efforts by the patient and technologist, motion            artifact is present on some series of today's examination  and could            not be totally eliminated. This reduces diagnostic sensitivity and             Specificity.    2. Right arm pain likely multifactoral,Signifigant cervical spondylosis may contribute as well as focal neuropathy.   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  Consider splinting, education on arm positioning   3.Low back pain   09/12/10 lumbar xray result  Significant narrowing at L4-5 is  stable. Mild narrowing at L3-4. No vertebral body height loss.  No pars defect. Extensive vascular calcifications are noted.  Degenerative changes of the SI joints are moderate.    4. bilateral lower extremity numbness with 20 year history of diabetes mellitus.    #5. Posterior hip pain with internal and external rotation at the hip, right hip radiograph    RIGHT HIP - COMPLETE 2+ VIEW    Comparison: CT abdomen pelvis 09/17/2010  Findings: Right femoral head is located in the right acetabulum.  No acute bony abnormalities identified. There is slight  subchondral sclerosis of both hips and slight joint space narrowing  of the left hip. No definite/significant joint space narrowing of  the right hip appreciated.  There is sclerosis about both sacroiliac joints, along the mid and  inferior aspects.  IMPRESSION:  1. Sclerosis about both sacroiliac joints suggests bilateral  sacroiliitis. Question if this could be the cause of the patient's  pain.  2. Slight degenerative changes of the hips, left greater than  right, not unexpected for patient age.  Original Report Authenticated By: Britta Mccreedy, M.D. done 04/10/11:     5. Balance disorder most likely related to above   Plan:   soft cervical collar ordered.  I reviewed the risks and benefits of gabapentin. She understands. She would like to undergo a trial with gabapentin.  She apparently has some home  health physical therapy.   Consider physical / occupational therapy. Education on joint protection techniques as well as pacing.  Pain management techniques as well as core stabilization,balance program, education on proper body mechanics and posture.   Patient understands goal is to minimize pain medications so as to prevent complications such as falls.  She Is currently comfortable with our plan.

## 2011-05-19 NOTE — Patient Instructions (Signed)
You have a prescription for a soft cervical neck brace to help with your neck and arm pain.  You have been started on gabapentin. Please discontinue it if you have problems with sleepiness or feel unsafe when your walking or developed  Leg swelling  At some point you would benefit from physical therapy to address your hip strength and balance and transfers from sitting to standing and back. You may also benefit from some education on pain management techniques that a therapist could also provide. They can also teach you about proper body mechanics and posture as well as work on some core strengthening if your cardiologist think you can participate safely.  Will see you back in one month

## 2011-05-30 ENCOUNTER — Encounter: Payer: Self-pay | Admitting: Cardiology

## 2011-05-30 ENCOUNTER — Ambulatory Visit (INDEPENDENT_AMBULATORY_CARE_PROVIDER_SITE_OTHER): Payer: Medicare Other | Admitting: Cardiology

## 2011-05-30 VITALS — BP 119/80 | HR 64 | Ht 69.0 in | Wt 213.1 lb

## 2011-05-30 DIAGNOSIS — I2510 Atherosclerotic heart disease of native coronary artery without angina pectoris: Secondary | ICD-10-CM

## 2011-05-30 DIAGNOSIS — I10 Essential (primary) hypertension: Secondary | ICD-10-CM

## 2011-05-30 DIAGNOSIS — I509 Heart failure, unspecified: Secondary | ICD-10-CM

## 2011-05-30 MED ORDER — CARVEDILOL 25 MG PO TABS: 37.5000 mg | ORAL_TABLET | Freq: Two times a day (BID) | ORAL | Status: DC

## 2011-05-30 MED ORDER — FUROSEMIDE 80 MG PO TABS: 80.0000 mg | ORAL_TABLET | Freq: Every day | ORAL | Status: DC

## 2011-05-30 NOTE — Assessment & Plan Note (Signed)
The patient has no new sypmtoms.  No further cardiovascular testing is indicated.  We will continue with aggressive risk reduction and meds as listed.  

## 2011-05-30 NOTE — Assessment & Plan Note (Signed)
Her blood pressure is labile.  I will not adjust her medications further.  The treatment will be BP control.

## 2011-05-30 NOTE — Progress Notes (Signed)
HPI The patient returns for followup of cardiomyopathy.  She was hospitalized in late April with hypertensive urgency, heart failure and chest pain. She did have a catheterization demonstrating 20% left main stenosis, distal LAD 99% stenosis unchanged from previous catheterization. She had patent mid and distal right coronary stents. Her EF was 35%. He was managed with volume management. She had a severe headache which was treated as a migraine. Her blood pressure was controlled in the hospital. Since going home she has had significant arm pain and is being treated for neuropathy and arthritis. She says that when she is in pain her blood pressure will go up. She denies any new shortness of breath, PND or orthopnea. She's not having any new chest pressure, neck or arm discomfort.  Allergies  Allergen Reactions  . Amlodipine Other (See Comments)    constipation  . Ramipril Cough  . Adhesive (Tape) Hives    "peels skin off"  . Penicillins Itching  . Codeine Rash    Current Outpatient Prescriptions  Medication Sig Dispense Refill  . acetaminophen (TYLENOL ARTHRITIS PAIN) 650 MG CR tablet Take 1,300 mg by mouth every 8 (eight) hours as needed. For pain      . aspirin 81 MG tablet Take 81 mg by mouth daily.        [name] atorvastatin (LIPITOR) 40 MG tablet Take 1 tablet (40 mg total) by mouth daily.  90 tablet  3  . bisacodyl (DULCOLAX) 5 MG EC tablet Take 10 mg by mouth at bedtime as needed. For constipation      . carvedilol (COREG) 25 MG tablet Take 1.5 tablets (37.5 mg total) by mouth 2 (two) times daily with a meal. Take one and one half tablet twice daily. PT WANTS BRAND ONLY  90 tablet  10  . cloNIDine (CATAPRES - DOSED IN MG/24 HR) 0.2 mg/24hr patch Place 1 patch onto the skin once a week. sunday      . diclofenac sodium (VOLTAREN) 1 % GEL Apply 1 application topically 4 (four) times daily as needed. For swelling and pain      . furosemide (LASIX) 80 MG tablet Take 1 tablet (80 mg total) by  mouth daily.  30 tablet  11  . gabapentin (NEURONTIN) 100 MG capsule Take 1 capsule (100 mg total) by mouth 3 (three) times daily. Start one capsule at bedtime for 3 days, then  Twice a day for 3 days, then  3 times a day  90 capsule  1  . insulin aspart (NOVOLOG) 100 UNIT/ML injection Inject 10-15 Units into the skin 3 (three) times daily before meals. Sliding scale.    15 units at breakfast, 10units at lunch,10 units at dinner      . insulin glargine (LANTUS) 100 UNIT/ML injection Inject 10-20 Units into the skin at bedtime. Per sliding scale      . lubiprostone (AMITIZA) 24 MCG capsule Take 1 capsule (24 mcg total) by mouth 2 (two) times daily with a meal.  60 capsule  3  . mometasone-formoterol (DULERA) 100-5 MCG/ACT AERO Inhale 2 puffs into the lungs 2 (two) times daily.      [name] olmesartan (BENICAR) 40 MG tablet Take 40 mg by mouth daily.      . pantoprazole (PROTONIX) 40 MG tablet Take 40 mg by mouth 2 (two) times daily.      . Polyethylene Glycol 3350 (MIRALAX PO) Take 17 g by mouth daily. As directed       . potassium chloride (  MICRO-K) 10 MEQ CR capsule Take 10 mEq by mouth daily.        . SitaGLIPtin-MetFORMIN HCl 50-1000 MG TB24 Take 1 tablet by mouth daily.  112 tablet  0  . venlafaxine (EFFEXOR XR) 150 MG 24 hr capsule Take 150 mg by mouth 2 (two) times daily.        [name] DISCONTD: carvedilol (COREG) 25 MG tablet Take 1.5 tablets (37.5 mg total) by mouth 2 (two) times daily with a meal. Take one and one half tablet twice daily. PT WANTS BRAND ONLY  30 tablet  10  . DISCONTD: furosemide (LASIX) 80 MG tablet Take 80 mg by mouth daily.      [name] DISCONTD: budesonide-formoterol (SYMBICORT) 80-4.5 MCG/ACT inhaler Inhale 2 puffs into the lungs 2 (two) times daily.  1 Inhaler  12    Past Medical History  Diagnosis Date  . DM   . DYSLIPIDEMIA   . OBESITY NOS   . DEPRESSION, RECURRENT, IN PARTIAL REMISSION   . HYPERTENSION   . CORONARY ARTERY DISEASE     S/p Q wave MI 2002 with RCA stent  and AngioJet  . CONGESTIVE HEART FAILURE     Cath 05/2008 with non obstructive CAD but global hypokinesis, EF 30-35%  . GERD   . DIVERTICULOSIS, COLON, WITH HEMORRHAGE   . BACK PAIN, LUMBAR, WITH RADICULOPATHY   . ABNORMAL ELECTROCARDIOGRAM   . OBSTRUCTIVE SLEEP APNEA   . Asthma     Past Surgical History  Procedure Date  . Uvuloplasty   . Total abdominal hysterectomy w/ bilateral salpingoophorectomy   . Abdominal hysterectomy   . Cholecystectomy     ROS  As stated in the HPI and negative for all other systems.  PHYSICAL EXAM BP 119/80  Pulse 64  Ht 5\' 9"  (1.753 m)  Wt 213 lb 1.9 oz (96.671 kg)  BMI 31.47 kg/m2 GENERAL:  Well appearing HEENT:  Pupils equal round and reactive, fundi not visualized, oral mucosa unremarkable NECK:  No jugular venous distention, waveform within normal limits, carotid upstroke brisk and symmetric, no bruits, no thyromegaly LYMPHATICS:  No cervical, inguinal adenopathy LUNGS:  Clear to auscultation bilaterally BACK:  No CVA tenderness CHEST:  Unremarkable HEART:  PMI not displaced or sustained,S1 and S2 within normal limits, no S3, no S4, no clicks, no rubs, no murmurs ABD:  Flat, positive bowel sounds normal in frequency in pitch, no bruits, no rebound, no guarding, no midline pulsatile mass, no hepatomegaly, no splenomegaly, obese EXT:  2 plus pulses throughout, no edema, no cyanosis no clubbing, 10 by 12 mobile soft nonpulsatile mass medial aspect of the antecubital fossa  SKIN:  No rashes no nodules NEURO:  Cranial nerves II through XII grossly intact, motor grossly intact throughout PSYCH:  Cognitively intact, oriented to person place and time  ASSESSMENT AND PLAN

## 2011-05-30 NOTE — Patient Instructions (Signed)
The current medical regimen is effective;  continue present plan and medications.  Follow up in 6 months with Dr Hochrein.  You will receive a letter in the mail 2 months before you are due.  Please call us when you receive this letter to schedule your follow up appointment.  

## 2011-05-30 NOTE — Assessment & Plan Note (Signed)
Her EF is 35%.  She seems to be euvolemic.  At this point, no change in therapy is indicated.  We have reviewed salt and fluid restrictions.  No further cardiovascular testing is indicated.

## 2011-05-31 ENCOUNTER — Encounter: Payer: Self-pay | Admitting: Gastroenterology

## 2011-05-31 ENCOUNTER — Ambulatory Visit (INDEPENDENT_AMBULATORY_CARE_PROVIDER_SITE_OTHER): Payer: Medicare Other | Admitting: Gastroenterology

## 2011-05-31 DIAGNOSIS — K5900 Constipation, unspecified: Secondary | ICD-10-CM

## 2011-05-31 NOTE — Progress Notes (Signed)
Review of pertinent gastrointestinal problems: 1. Likely diabetic gastroparesis causing nausea, bloating, some abdominal discomfort. I do not see any previous gastric emptying scans to prove this.  2. History of colon polyps. Last colonoscopy June 2008 by Dr. Victorino Dike found diverticulosis, tortuous colon, no polyps. He recommended she have a repeat colonoscopy in 8 years. I have not found polyp pathology in her chart. Probable diverticular bleed February, 2009. Admitted to the hospital with bright red blood per rectum, mild anemia, bleeding resolved without intervention. No need for blood transfusion.  3. Chronic constipation. Constipation imroving with daily Miralax.     HPI: This is a   very pleasant 66 year old woman whom I last saw 4 or 5 years ago.  She has a  Discomfort in her upper back, goes down right arm.  Feels like a "big glob of something is" at her right neck, upper arm.  She also has constipation.  She will have go every 1-3 days.  Not on narcotic pain meds.  Has to push and strain. She recently restarted miralax.    Feels like something is blocking her stools.  Intermittent rectal bleeding.   CAT scan with IV and oral contrast, abdomen and pelvis 2 weeks ago was essentially normal  Cardiologist appt yesterday, she has CHF with EF 35%   Review of systems: Pertinent positive and negative review of systems were noted in the above HPI section. Complete review of systems was performed and was otherwise normal.    Past Medical History  Diagnosis Date  . DM   . DYSLIPIDEMIA   . OBESITY NOS   . DEPRESSION, RECURRENT, IN PARTIAL REMISSION   . HYPERTENSION   . 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 CONGESTIVE HEART FAILURE     EF 35% echo 04/2011  . GERD   . DIVERTICULOSIS, COLON, WITH HEMORRHAGE   . BACK PAIN, LUMBAR, WITH  RADICULOPATHY   . OBSTRUCTIVE SLEEP APNEA   . Asthma     Past Surgical History  Procedure Date  . Uvuloplasty   . Total abdominal hysterectomy w/ bilateral salpingoophorectomy   . Abdominal hysterectomy   . Cholecystectomy     Current Outpatient Prescriptions  Medication Sig Dispense Refill  . acetaminophen (TYLENOL ARTHRITIS PAIN) 650 MG CR tablet Take 1,300 mg by mouth every 8 (eight) hours as needed. For pain      . aspirin 81 MG tablet Take 81 mg by mouth daily.        [name] atorvastatin (LIPITOR) 40 MG tablet Take 1 tablet (40 mg total) by mouth daily.  90 tablet  3  . bisacodyl (DULCOLAX) 5 MG EC tablet Take 10 mg by mouth at bedtime as needed. For constipation      . carvedilol (COREG) 25 MG tablet Take 1.5 tablets (37.5 mg total) by mouth 2 (two) times daily with a meal. Take one and one half tablet twice daily. PT WANTS BRAND ONLY  90 tablet  10  . cloNIDine (CATAPRES - DOSED IN MG/24 HR) 0.2 mg/24hr patch Place 1 patch onto the skin once a week. sunday      . diclofenac sodium (VOLTAREN) 1 % GEL Apply 1 application topically 4 (four) times daily as needed. For swelling and pain      . furosemide (LASIX) 80 MG tablet Take 1 tablet (80 mg  total) by mouth daily.  30 tablet  11  . gabapentin (NEURONTIN) 100 MG capsule Take 1 capsule (100 mg total) by mouth 3 (three) times daily. Start one capsule at bedtime for 3 days, then  Twice a day for 3 days, then  3 times a day  90 capsule  1  . insulin aspart (NOVOLOG) 100 UNIT/ML injection Inject 10-15 Units into the skin 3 (three) times daily before meals. Sliding scale.    15 units at breakfast, 10units at lunch,10 units at dinner      . insulin glargine (LANTUS) 100 UNIT/ML injection Inject 10-20 Units into the skin at bedtime. Per sliding scale      . mometasone-formoterol (DULERA) 100-5 MCG/ACT AERO Inhale 2 puffs into the lungs 2 (two) times daily.      [name] olmesartan (BENICAR) 40 MG tablet Take 40 mg by mouth daily.      . pantoprazole  (PROTONIX) 40 MG tablet Take 40 mg by mouth 2 (two) times daily.      . Polyethylene Glycol 3350 (MIRALAX PO) Take 17 g by mouth daily. As directed       . potassium chloride (MICRO-K) 10 MEQ CR capsule Take 10 mEq by mouth daily.        . SitaGLIPtin-MetFORMIN HCl 50-1000 MG TB24 Take 1 tablet by mouth daily.  112 tablet  0  . venlafaxine (EFFEXOR XR) 150 MG 24 hr capsule Take 150 mg by mouth 2 (two) times daily.        [name] DISCONTD: budesonide-formoterol (SYMBICORT) 80-4.5 MCG/ACT inhaler Inhale 2 puffs into the lungs 2 (two) times daily.  1 Inhaler  12    Allergies as of 05/31/2011 - Review Complete 05/31/2011  Allergen Reaction Noted  . Amlodipine Other (See Comments) 01/30/2011  . Ramipril Cough 09/26/2010  . Adhesive (tape) Hives 09/26/2010  . Penicillins Itching   . Codeine Rash 09/26/2010    Family History  Problem Relation Age of Onset  . Stroke Mother   . Breast cancer Sister   . Emphysema Sister   . Cancer Sister     breast  . Alcohol abuse Brother   . Colon cancer      uncles x 2  . Prostate cancer Brother     History   Social History  . Marital Status: Married    Spouse Name: N/A    Number of Children: 0  . Years of Education: N/A   Occupational History  .     Social History Main Topics  . Smoking status: Former Smoker -- 1.0 packs/day for 20 years    Types: Cigarettes    Quit date: 01/03/2007  . Smokeless tobacco: Never Used  . Alcohol Use: No  . Drug Use: No  . Sexually Active: Not Currently    Birth Control/ Protection: Surgical   Other Topics Concern  . Not on file   Social History Narrative  . No narrative on file       Physical Exam: BP 140/82  Pulse 72  Ht 5' 9.5" (1.765 m)  Wt 215 lb (97.523 kg)  BMI 31.29 kg/m2 Constitutional: generally well-appearing Psychiatric: alert and oriented x3 Eyes: extraocular movements intact Mouth: oral pharynx moist, no lesions Neck: supple no lymphadenopathy Cardiovascular: heart regular rate  and rhythm Lungs: clear to auscultation bilaterally Abdomen: soft, nontender, nondistended, no obvious ascites, no peritoneal signs, normal bowel sounds Extremities: no lower extremity edema bilaterally Skin: no lesions on visible extremities    Assessment and plan: 66  y.o. female with chronic constipation  She has had this complaint in the past. Previously MiraLax seemed to help her. Currently it does but tends to make her stools a little bit loose. She has only been back on it for about 2 weeks. I recommended she add fiber supplements to this MiraLax regimen. She will return to see me in 7-8 weeks and sooner if needed.

## 2011-05-31 NOTE — Patient Instructions (Signed)
Continue one dose of miralax every day. Please start taking citrucel (orange flavored) powder fiber supplement.  This may cause some bloating at first but that usually goes away. Begin with a small spoonful and work your way up to a large, heaping spoonful daily over a week. Return to see Dr. Christella Hartigan in 7-8 weeks.

## 2011-06-01 DIAGNOSIS — E119 Type 2 diabetes mellitus without complications: Secondary | ICD-10-CM

## 2011-06-01 DIAGNOSIS — I5021 Acute systolic (congestive) heart failure: Secondary | ICD-10-CM

## 2011-06-01 DIAGNOSIS — M545 Low back pain, unspecified: Secondary | ICD-10-CM

## 2011-06-01 DIAGNOSIS — I509 Heart failure, unspecified: Secondary | ICD-10-CM

## 2011-06-11 ENCOUNTER — Other Ambulatory Visit: Payer: Self-pay | Admitting: Internal Medicine

## 2011-06-21 ENCOUNTER — Encounter
Payer: Medicare Other | Attending: Physical Medicine and Rehabilitation | Admitting: Physical Medicine and Rehabilitation

## 2011-06-21 ENCOUNTER — Encounter: Payer: Self-pay | Admitting: Physical Medicine and Rehabilitation

## 2011-06-21 VITALS — BP 166/89 | HR 78 | Resp 16 | Ht 69.5 in | Wt 212.2 lb

## 2011-06-21 DIAGNOSIS — M79601 Pain in right arm: Secondary | ICD-10-CM

## 2011-06-21 DIAGNOSIS — M79609 Pain in unspecified limb: Secondary | ICD-10-CM

## 2011-06-21 DIAGNOSIS — R269 Unspecified abnormalities of gait and mobility: Secondary | ICD-10-CM | POA: Insufficient documentation

## 2011-06-21 DIAGNOSIS — M5412 Radiculopathy, cervical region: Secondary | ICD-10-CM

## 2011-06-21 DIAGNOSIS — M542 Cervicalgia: Secondary | ICD-10-CM

## 2011-06-21 MED ORDER — GABAPENTIN 100 MG PO CAPS: 100.0000 mg | ORAL_CAPSULE | Freq: Three times a day (TID) | ORAL | Status: DC

## 2011-06-21 NOTE — Progress Notes (Signed)
Subjective:    Patient ID: [name], female    DOB: [birth year], 66 y.o.   MRN: [id number]  HPI The patient is a 66 year old African American woman who has been referred by Dr. [name].  She reports her chief complaint is neck and right upper extremity pain.  She also reports some low back pain with occasional radiation to her right leg.    She reports that her neck pain came on gradually about 6 months ago possibly a little longer.   She denies any history of falls or accidents or trauma. She really cannot think of anything that brought this on.   Pain radiates from her neck to her right hand. She reports this pain is constant  Her neck pain is worse with movement.    She tells me her pain varies and some days her low back is worse than her neck, however today her neck is the worst problem addition to the arm.   She reports about a 6 month history of right hand tingling as well. She reports a history of Right carpal tunnel surgery approximately 12 years ago.    Since she was last seen she's been seen in the ED for constipation,hyperglycemia, chestpain.    Pain Inventory Average Pain 5 Pain Right Now 4 My pain is intermittent and dull  In the last 24 hours, has pain interfered with the following? General activity 5 Relation with others 5 Enjoyment of life 4 What TIME of day is your pain at its worst? evening Sleep (in general) Good  Pain is worse with: bending Pain improves with: heat/ice Relief from Meds: not taking pain medication  Mobility use a cane how many minutes can you walk? 20 ability to climb steps?  no do you drive?  no  Function disabled: date disabled 36 I need assistance with the following:  dressing, bathing and household duties  Neuro/Psych bowel control problems weakness numbness tingling dizziness depression  Prior Studies Any changes since last visit?  no  Physicians involved in your care Any changes since last visit?   no   Family History  Problem Relation Age of Onset  . Stroke Mother   . Breast cancer Sister   . Emphysema Sister   . Cancer Sister     breast  . Alcohol abuse Brother   . Colon cancer      uncles x 2  . Prostate cancer Brother    History   Social History  . Marital Status: Married    Spouse Name: N/A    Number of Children: 0  . Years of Education: N/A   Occupational History  .     Social History Main Topics  . Smoking status: Former Smoker -- 1.0 packs/day for 20 years    Types: Cigarettes    Quit date: 01/03/2007  . Smokeless tobacco: Never Used  . Alcohol Use: No  . Drug Use: No  . Sexually Active: Not Currently    Birth Control/ Protection: Surgical   Other Topics Concern  . None   Social History Narrative  . None   Past Surgical History  Procedure Date  . Uvuloplasty   . Total abdominal hysterectomy w/ bilateral salpingoophorectomy   . Abdominal hysterectomy   . Cholecystectomy    Past Medical History  Diagnosis Date  . DM   . DYSLIPIDEMIA   . OBESITY NOS   . DEPRESSION, RECURRENT, IN PARTIAL REMISSION   . HYPERTENSION   . CORONARY ARTERY DISEASE  S/p Q wave MI 2002 with RCA stent and AngioJet   Cath 4/30 99% distal LAD stenosis unchanged from previous, 20% left main stenosis, patent stents  . CONGESTIVE HEART FAILURE     EF 35% echo 04/2011  . GERD   . DIVERTICULOSIS, COLON, WITH HEMORRHAGE   . BACK PAIN, LUMBAR, WITH RADICULOPATHY   . OBSTRUCTIVE SLEEP APNEA   . Asthma    BP 166/89  Pulse 78  Resp 16  Ht 5' 9.5" (1.765 m)  Wt 212 lb 3.2 oz (96.253 kg)  BMI 30.89 kg/m2  SpO2 98%   Review of Systems  Constitutional: Positive for diaphoresis.  Respiratory: Positive for shortness of breath.   Cardiovascular: Positive for leg swelling.  Gastrointestinal: Positive for abdominal pain and constipation.  Musculoskeletal: Positive for back pain.  Neurological: Positive for dizziness, weakness and numbness.  Psychiatric/Behavioral:  Positive for dysphoric mood.  All other systems reviewed and are negative.       Objective:   Physical Exam  Patient is a well-developed well-nourished mildly obese woman who appears her stated age and does not appear in any distress she is oriented x3 speech is clear affect is bright she's alert cooperative and pleasant , answers the question appropriately cranial nerves and coordination are intact.  Reflexes are diminished in both upper and lower extremities there's no abnormal tone clonus or tremors    Hoffman Sign is negative    She has good strength in both upper and lower extremities without obvious focal deficit    She's decreased sensation and median distribution on the right   She has decreased sensation below the knees bilaterally, decreased vibratory senses noted as well    She transitions relatively easily from sitting to standing   gait is stable nonantalgic   Difficulty as noted with tandem gait as well as Romberg test  .  well preserved range of motion is noted in her neck and shoulders.   Forward flexion of the lumbar spine does not aggravate pain however extension does exacerbate   Tenderness is noted over the right sacroiliac joint    Internal and external rotation of the right hip also aggravate posterior hip pain         Assessment & Plan:  1. Cervicalgia  MRI cervical 03/02/11  1. Cervical spondylosis and degenerative disc disease results and  very degrees of foraminal impingement at all levels between C2 and  C7.  2. Despite efforts by the patient and technologist, motion  artifact is present on some series of today's examination and could  not be totally eliminated. This reduces diagnostic sensitivity and  Specificity.     2. Right arm pain likely multifactoral,Signifigant cervical spondylosis may contribute as well as focal neuropathy.    Mild rotator cuff tendinitis possible as well, patient puts quite a bit of weight through her right  upper extremity while using her cane. A walker may help distribute weight better through her upper extremities, a rolling walker is ordered today.   Electrodiagnostic studies showed moderate right ulnar neuropathy localized to elbow and mild involvement of right median nerve localized to the wrist.  Consider splinting, education on arm positioning    3.Low back pain (not a problem at this time) 09/12/10 lumbar xray result  Significant narrowing at L4-5 is  stable. Mild narrowing at L3-4. No vertebral body height loss.  No pars defect. Extensive vascular calcifications are noted.  Degenerative changes of the SI joints are moderate.    4. bilateral  lower extremity numbness with 20 year history of diabetes mellitus.  Bothers mostly at night but does not prevent sleep   5. Posterior hip pain with internal and external rotation at the hip, right hip radiograph  RIGHT HIP - COMPLETE 2+ VIEW  Comparison: CT abdomen pelvis 09/17/2010  Findings: Right femoral head is located in the right acetabulum.  No acute bony abnormalities identified. There is slight  subchondral sclerosis of both hips and slight joint space narrowing  of the left hip. No definite/significant joint space narrowing of  the right hip appreciated.  There is sclerosis about both sacroiliac joints, along the mid and  inferior aspects.  IMPRESSION:  1. Sclerosis about both sacroiliac joints suggests bilateral  sacroiliitis. Question if this could be the cause of the patient's  pain.  2. Slight degenerative changes of the hips, left greater than  right, not unexpected for patient age.  Original Report Authenticated By: Britta Mccreedy, M.D.  done 04/10/11:    6. Balance disorder most likely related to above will order rolling walker   Plan:  soft cervical collar ordered,but she did not fill it.  Today we discussed body mechanics and ergonomics. She understands that positioning while watching television or on the  telephone can adversely affect her neck pain.    She is currently taking gabapentin 3 times a day and has noted a overall improvement in in her right arm pain. She has not had any side effects from the medication and plan to continue to take it.    She apparently has some home health physical therapy which she is still participating in.    Consider physical / occupational therapy. Education on joint protection techniques as well as pacing.  Pain management techniques as well as core stabilization,balance program, education on proper body mechanics and posture.     Patient understands goal is to minimize pain medications so as to prevent complications such as falls. I will order a rolling walker to prevent falls.    She Is currently comfortable with our plan. I will see her back in 2 months.

## 2011-06-21 NOTE — Patient Instructions (Signed)
I have ordered a rolling walker or you  I have refilled your gabapentin  Continue in physical therapy to work on balance and walking with your walker  I will see you back in 2 months

## 2011-06-27 ENCOUNTER — Ambulatory Visit (INDEPENDENT_AMBULATORY_CARE_PROVIDER_SITE_OTHER): Payer: Medicare Other | Admitting: Internal Medicine

## 2011-06-27 ENCOUNTER — Encounter: Payer: Self-pay | Admitting: Internal Medicine

## 2011-06-27 ENCOUNTER — Other Ambulatory Visit (INDEPENDENT_AMBULATORY_CARE_PROVIDER_SITE_OTHER): Payer: Medicare Other

## 2011-06-27 VITALS — BP 130/60 | HR 85 | Temp 98.7°F | Resp 16 | Wt 208.0 lb

## 2011-06-27 DIAGNOSIS — E118 Type 2 diabetes mellitus with unspecified complications: Secondary | ICD-10-CM

## 2011-06-27 DIAGNOSIS — R7309 Other abnormal glucose: Secondary | ICD-10-CM

## 2011-06-27 DIAGNOSIS — I10 Essential (primary) hypertension: Secondary | ICD-10-CM

## 2011-06-27 DIAGNOSIS — R739 Hyperglycemia, unspecified: Secondary | ICD-10-CM

## 2011-06-27 DIAGNOSIS — K921 Melena: Secondary | ICD-10-CM | POA: Insufficient documentation

## 2011-06-27 DIAGNOSIS — D649 Anemia, unspecified: Secondary | ICD-10-CM

## 2011-06-27 LAB — FERRITIN: Ferritin: 28.8 ng/mL (ref 10.0–291.0)

## 2011-06-27 LAB — CBC WITH DIFFERENTIAL/PLATELET
Basophils Absolute: 0 10*3/uL (ref 0.0–0.1)
HCT: 32.3 % — ABNORMAL LOW (ref 36.0–46.0)
Hemoglobin: 10.5 g/dL — ABNORMAL LOW (ref 12.0–15.0)
Lymphs Abs: 1.2 10*3/uL (ref 0.7–4.0)
MCV: 88.7 fl (ref 78.0–100.0)
Monocytes Absolute: 0.3 10*3/uL (ref 0.1–1.0)
Monocytes Relative: 5 % (ref 3.0–12.0)
Neutro Abs: 4.2 10*3/uL (ref 1.4–7.7)
RDW: 17.1 % — ABNORMAL HIGH (ref 11.5–14.6)

## 2011-06-27 LAB — COMPREHENSIVE METABOLIC PANEL
ALT: 10 U/L (ref 0–35)
Alkaline Phosphatase: 81 U/L (ref 39–117)
CO2: 29 mEq/L (ref 19–32)
Creatinine, Ser: 1.1 mg/dL (ref 0.4–1.2)
GFR: 66.4 mL/min (ref >60.00)
Glucose, Bld: 290 mg/dL — ABNORMAL HIGH (ref 70–99)
Total Bilirubin: 0.5 mg/dL (ref 0.3–1.2)

## 2011-06-27 LAB — IBC PANEL
Saturation Ratios: 13.1 % — ABNORMAL LOW (ref 20.0–50.0)
Transferrin: 250.7 mg/dL (ref 212.0–360.0)

## 2011-06-27 LAB — HM DIABETES FOOT EXAM: HM Diabetic Foot Exam: NORMAL

## 2011-06-27 MED ORDER — SITAGLIPTIN PHOS-METFORMIN HCL 50-500 MG PO TABS: 1.0000 | ORAL_TABLET | Freq: Two times a day (BID) | ORAL | Status: DC

## 2011-06-27 NOTE — Patient Instructions (Signed)
Diabetes, Type 2 Diabetes is a long-lasting (chronic) disease. In type 2 diabetes, the pancreas does not make enough insulin (a hormone), and the body does not respond normally to the insulin that is made. This type of diabetes was also previously called adult-onset diabetes. It usually occurs after the age of 40, but it can occur at any age.  CAUSES  Type 2 diabetes happens because the pancreasis not making enough insulin or your body has trouble using the insulin that your pancreas does make properly. SYMPTOMS   Drinking more than usual.   Urinating more than usual.   Blurred vision.   Dry, itchy skin.   Frequent infections.   Feeling more tired than usual (fatigue).  DIAGNOSIS The diagnosis of type 2 diabetes is usually made by one of the following tests:  Fasting blood glucose test. You will not eat for at least 8 hours and then take a blood test.   Random blood glucose test. Your blood glucose (sugar) is checked at any time of the day regardless of when you ate.   Oral glucose tolerance test (OGTT). Your blood glucose is measured after you have not eaten (fasted) and then after you drink a glucose containing beverage.  TREATMENT   Healthy eating.   Exercise.   Medicine, if needed.   Monitoring blood glucose.   Seeing your caregiver regularly.  HOME CARE INSTRUCTIONS   Check your blood glucose at least once a day. More frequent monitoring may be necessary, depending on your medicines and on how well your diabetes is controlled. Your caregiver will advise you.   Take your medicine as directed by your caregiver.   Do not smoke.   Make wise food choices. Ask your caregiver for information. Weight loss can improve your diabetes.   Learn about low blood glucose (hypoglycemia) and how to treat it.   Get your eyes checked regularly.   Have a yearly physical exam. Have your blood pressure checked and your blood and urine tested.   Wear a pendant or bracelet saying  that you have diabetes.   Check your feet every night for cuts, sores, blisters, and redness. Let your caregiver know if you have any problems.  SEEK MEDICAL CARE IF:   You have problems keeping your blood glucose in target range.   You have problems with your medicines.   You have symptoms of an illness that do not improve after 24 hours.   You have a sore or wound that is not healing.   You notice a change in vision or a new problem with your vision.   You have a fever.  MAKE SURE YOU:  Understand these instructions.   Will watch your condition.   Will get help right away if you are not doing well or get worse.  Document Released: 12/19/2004 Document Revised: 12/08/2010 Document Reviewed: 06/06/2010 [employer] Patient Information 2012 [employer].Iron Deficiency Anemia There are many types of anemia. Iron deficiency anemia is the most common. Iron deficiency anemia is a decrease in the number of red blood cells caused by too little iron. Without enough iron, your body does not produce enough hemoglobin. Hemoglobin is a substance in red blood cells that carries oxygen to the body's tissues. Iron deficiency anemia may leave you tired and short of breath. CAUSES   Lack of iron in the diet.   This may be seen in infants and children, because there is little iron in milk.   This may be seen in adults who   do not eat enough iron-rich foods.   This may be seen in pregnant or breastfeeding women who do not take iron supplements. There is a much higher need for iron intake at these times.   Poor absorption of iron, as seen with intestinal disorders.   Intestinal bleeding.   Heavy periods.  SYMPTOMS  Mild anemia may not be noticeable. Symptoms may include:  Fatigue.   Headache.   Pale skin.   Weakness.   Shortness of breath.   Dizziness.   Cold hands and feet.   Fast or irregular heartbeat.  DIAGNOSIS  Diagnosis requires a thorough evaluation and physical exam by  your caregiver.  Blood tests are generally used to confirm iron deficiency anemia.   Additional tests may be done to find the underlying cause of your anemia. These may include:   Testing for blood in the stool (fecal occult blood test).   A procedure to see inside the colon and rectum (colonoscopy).   A procedure to see inside the esophagus and stomach (endoscopy).  TREATMENT   Correcting the cause of the iron deficiency is the first step.   Medicines, such as oral contraceptives, can make heavy menstrual flows lighter.   Antibiotics and other medicines can be used to treat peptic ulcers.   Surgery may be needed to remove a bleeding polyp, tumor, or fibroid.   Often, iron supplements (ferrous sulfate) are taken.   For the best iron absorption, take these supplements with an empty stomach.   You may need to take the supplements with food if you cannot tolerate them on an empty stomach. Vitamin C improves the absorption of iron. Your caregiver may recommend taking your iron tablets with a glass of orange juice or vitamin C supplement.   Milk and antacids should not be taken at the same time as iron supplements. They may interfere with the absorption of iron.   Iron supplements can cause constipation. A stool softener is often recommended.   Pregnant and breastfeeding women will need to take extra iron, because their normal diet usually will not provide the required amount.   Patients who cannot tolerate iron by mouth can take it through a vein (intravenously) or by an injection into the muscle.  HOME CARE INSTRUCTIONS   Ask your dietitian for help with diet questions.   Take iron and vitamins as directed by your caregiver.   Eat a diet rich in iron. Eat liver, lean beef, whole-grain bread, eggs, dried fruit, and dark green leafy vegetables.  SEEK IMMEDIATE MEDICAL CARE IF:   You have a fainting episode. Do not drive yourself. Call your local emergency services (911 in [place]  if no other help is available.   You have chest pain, nausea, or vomiting.   You develop severe or increased shortness of breath with activities.   You develop weakness or increased thirst.   You have a rapid heartbeat.   You develop unexplained sweating or become lightheaded when getting up from a chair or bed.  MAKE SURE YOU:   Understand these instructions.   Will watch your condition.   Will get help right away if you are not doing well or get worse.  Document Released: 12/17/1999 Document Revised: 12/08/2010 Document Reviewed: 04/27/2009 Exit[employer] Patient Information 2012 [employer]. 

## 2011-06-27 NOTE — Progress Notes (Signed)
Subjective:    Patient ID: [name], female    DOB: March 12, 1945, 66 y.o.   MRN: [id number]  Diabetes She presents for her follow-up diabetic visit. She has type 2 diabetes mellitus. There are no hypoglycemic associated symptoms. Pertinent negatives for diabetes include no blurred vision, no chest pain, no fatigue, no foot paresthesias, no foot ulcerations, no polydipsia, no polyphagia, no polyuria, no visual change, no weakness and no weight loss. There are no hypoglycemic complications. Diabetic complications include heart disease. Current diabetic treatment includes intensive insulin program and oral agent (dual therapy). She is compliant with treatment all of the time. Her weight is stable. She is following a generally healthy diet. Meal planning includes avoidance of concentrated sweets. She has not had a previous visit with a dietician. She never participates in exercise. There is no change in her home blood glucose trend. Her breakfast blood glucose range is generally 110-130 mg/dl. Her lunch blood glucose range is generally 110-130 mg/dl. Her dinner blood glucose range is generally 110-130 mg/dl. Her highest blood glucose is 140-180 mg/dl. Her overall blood glucose range is 130-140 mg/dl. An ACE inhibitor/angiotensin II receptor blocker is being taken. She does not see a podiatrist.Eye exam is current.      Review of Systems  Constitutional: Negative for weight loss and fatigue.  Eyes: Negative for blurred vision.  Cardiovascular: Negative for chest pain.  Gastrointestinal: Positive for blood in stool. Negative for nausea, vomiting, abdominal pain, diarrhea, constipation, abdominal distention, anal bleeding and rectal pain.  Genitourinary: Negative for polyuria.  Neurological: Negative for weakness.  Hematological: Negative for polydipsia and polyphagia.       Objective:   Physical Exam  Vitals reviewed. Constitutional: She is oriented to person, place, and time. She appears  well-developed and well-nourished. No distress.  HENT:  Head: Normocephalic and atraumatic.  Mouth/Throat: Oropharynx is clear and moist. No oropharyngeal exudate.  Eyes: Conjunctivae are normal. Right eye exhibits no discharge. Left eye exhibits no discharge. No scleral icterus.  Neck: Normal range of motion. Neck supple. No JVD present. No tracheal deviation present. No thyromegaly present.  Cardiovascular: Normal rate, regular rhythm, normal heart sounds and intact distal pulses.  Exam reveals no gallop and no friction rub.   No murmur heard. Pulmonary/Chest: Effort normal and breath sounds normal. No stridor. No respiratory distress. She has no wheezes. She has no rales. She exhibits no tenderness.  Abdominal: Soft. Bowel sounds are normal. She exhibits no distension and no mass. There is no tenderness. There is no rebound and no guarding.  Genitourinary: Rectum normal. Rectal exam shows no external hemorrhoid, no internal hemorrhoid, no fissure, no mass, no tenderness and anal tone normal. Guaiac negative stool.  Musculoskeletal: Normal range of motion. She exhibits no edema and no tenderness.  Lymphadenopathy:    She has no cervical adenopathy.  Neurological: She is oriented to person, place, and time.  Skin: Skin is warm and dry. No rash noted. She is not diaphoretic. No erythema. No pallor.  Psychiatric: She has a normal mood and affect. Her behavior is normal. Judgment and thought content normal.      Lab Results  Component Value Date   WBC 8.8 05/15/2011   HGB 10.9* 05/15/2011   HCT 32.7* 05/15/2011   PLT 297 05/15/2011   GLUCOSE 241* 05/15/2011   CHOL 152 09/18/2010   TRIG 71 09/18/2010   HDL 60 09/18/2010   LDLCALC 78 09/18/2010   ALT 11 05/15/2011   AST 15 05/15/2011  NA 132* 05/15/2011   K 4.6 05/15/2011   CL 97 05/15/2011   CREATININE 0.90 05/15/2011   BUN 10 05/15/2011   CO2 25 05/15/2011   TSH 2.50 12/15/2010   INR 1.10 05/02/2011   HGBA1C 8.6* 12/15/2010   MICROALBUR 2.45*  03/15/2007      Assessment & Plan:

## 2011-06-29 NOTE — Assessment & Plan Note (Signed)
Her BP is well controlled, I will check her lytes and renal function 

## 2011-06-29 NOTE — Assessment & Plan Note (Signed)
GI referral, possible need for endoscopy

## 2011-06-29 NOTE — Assessment & Plan Note (Signed)
I will check her a1c and will monitor her renal function 

## 2011-06-29 NOTE — Assessment & Plan Note (Signed)
I will recheck her CBC and will look at her iron level as well 

## 2011-07-03 ENCOUNTER — Telehealth: Payer: Self-pay | Admitting: Pulmonary Disease

## 2011-07-03 DIAGNOSIS — J4530 Mild persistent asthma, uncomplicated: Secondary | ICD-10-CM

## 2011-07-03 NOTE — Telephone Encounter (Signed)
LMOMTCB x 1 

## 2011-07-04 MED ORDER — MONTELUKAST SODIUM 10 MG PO TABS: 10.0000 mg | ORAL_TABLET | Freq: Every day | ORAL | Status: DC

## 2011-07-04 NOTE — Telephone Encounter (Signed)
I spoke with the pt and she states she has recently seen by an eye doctor and she has been diagnosed with diabetic retinopathy and cataracts. She states she has read that dulera can cause this and she wants to be changed to another inhaler. Please advise. Carron Curie, CMA

## 2011-07-04 NOTE — Telephone Encounter (Signed)
She can stop dulera and change to montelukast 10 mg one tablet nightly.  Can send script for 30 with 5 refills.  Please also ensure that she has rescue albuterol inhaler (if not, then send script for proair HFA two puffs q6h prn, dispense 1 with 5 refills).  Advise her to call back if her breathing gets worse after switching asthma medications.

## 2011-07-04 NOTE — Telephone Encounter (Signed)
Pt advised. She states she does have albuterol inhaler. Rx sent for [employer], CMA

## 2011-07-07 ENCOUNTER — Telehealth: Payer: Self-pay | Admitting: Internal Medicine

## 2011-07-07 NOTE — Telephone Encounter (Signed)
Linda at [facility] notified

## 2011-07-07 NOTE — Telephone Encounter (Signed)
ok 

## 2011-07-18 ENCOUNTER — Other Ambulatory Visit: Payer: Self-pay | Admitting: Internal Medicine

## 2011-07-18 DIAGNOSIS — Z1231 Encounter for screening mammogram for malignant neoplasm of breast: Secondary | ICD-10-CM

## 2011-07-20 ENCOUNTER — Ambulatory Visit
Admission: RE | Admit: 2011-07-20 | Discharge: 2011-07-20 | Disposition: A | Discharge status: Home / Self Care | Payer: Medicare Other | Source: Ambulatory Visit | Attending: Internal Medicine | Admitting: Internal Medicine

## 2011-07-20 DIAGNOSIS — Z1231 Encounter for screening mammogram for malignant neoplasm of breast: Secondary | ICD-10-CM

## 2011-07-20 LAB — HM MAMMOGRAPHY: HM Mammogram: NORMAL

## 2011-07-25 ENCOUNTER — Other Ambulatory Visit: Payer: Self-pay | Admitting: Internal Medicine

## 2011-07-25 DIAGNOSIS — R928 Other abnormal and inconclusive findings on diagnostic imaging of breast: Secondary | ICD-10-CM

## 2011-07-28 ENCOUNTER — Other Ambulatory Visit: Payer: Self-pay

## 2011-07-28 MED ORDER — OLMESARTAN MEDOXOMIL 40 MG PO TABS: 40.0000 mg | ORAL_TABLET | Freq: Every day | ORAL | Status: DC

## 2011-08-03 LAB — HM MAMMOGRAPHY

## 2011-08-04 ENCOUNTER — Ambulatory Visit
Admission: RE | Admit: 2011-08-04 | Discharge: 2011-08-04 | Disposition: A | Discharge status: Home / Self Care | Payer: Medicare Other | Source: Ambulatory Visit | Attending: Internal Medicine | Admitting: Internal Medicine

## 2011-08-04 DIAGNOSIS — R928 Other abnormal and inconclusive findings on diagnostic imaging of breast: Secondary | ICD-10-CM

## 2011-08-07 ENCOUNTER — Other Ambulatory Visit: Payer: Self-pay | Admitting: Internal Medicine

## 2011-08-07 DIAGNOSIS — R921 Mammographic calcification found on diagnostic imaging of breast: Secondary | ICD-10-CM

## 2011-08-08 ENCOUNTER — Ambulatory Visit (INDEPENDENT_AMBULATORY_CARE_PROVIDER_SITE_OTHER): Payer: Medicare Other | Admitting: Gastroenterology

## 2011-08-08 ENCOUNTER — Other Ambulatory Visit: Payer: Self-pay

## 2011-08-08 ENCOUNTER — Encounter: Payer: Self-pay | Admitting: Gastroenterology

## 2011-08-08 VITALS — BP 128/78 | HR 81 | Ht 69.5 in | Wt 203.4 lb

## 2011-08-08 DIAGNOSIS — K5900 Constipation, unspecified: Secondary | ICD-10-CM

## 2011-08-08 DIAGNOSIS — K625 Hemorrhage of anus and rectum: Secondary | ICD-10-CM

## 2011-08-08 MED ORDER — MOVIPREP 100 G PO SOLR: 1.0000 | ORAL | Status: DC

## 2011-08-08 NOTE — Progress Notes (Signed)
Review of pertinent gastrointestinal problems:  1. Likely diabetic gastroparesis causing nausea, bloating, some abdominal discomfort. I do not see any previous gastric emptying scans to prove this.  2. History of colon polyps. Last colonoscopy June 2008 by Dr. Victorino Dike found diverticulosis, tortuous colon, no polyps. He recommended she have a repeat colonoscopy in 8 years. I have not found polyp pathology in her chart. Probable diverticular bleed February, 2009. Admitted to the hospital with bright red blood per rectum, mild anemia, bleeding resolved without intervention. No need for blood transfusion.  3. Chronic constipation. Constipation imroving with daily Miralax.   HPI: This is a very pleasant 66 year old woman whom I last saw about 2 months ago.  Has been taking miralax, two doses per day and single dulcolax.  She tried stopping dulcolax.    SHe has lost 47 pounds in many months, excercising.  Every once in a while she sees blood in her stool, just a couple drops occasionally.  CBCs recently show slow gradual trend down (now anemic, elevated RDW).  Hemoglobin was normal back in March, now her hemoglobin is 10.4. She is not microcytic.   Past Medical History  Diagnosis Date  . DM   . DYSLIPIDEMIA   . OBESITY NOS   . DEPRESSION, RECURRENT, IN PARTIAL REMISSION   . HYPERTENSION   . 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 CONGESTIVE HEART FAILURE     EF 35% echo 04/2011  . GERD   . DIVERTICULOSIS, COLON, WITH HEMORRHAGE   . BACK PAIN, LUMBAR, WITH RADICULOPATHY   . OBSTRUCTIVE SLEEP APNEA   . Asthma     Past Surgical History  Procedure Date  . Uvuloplasty   . Total abdominal hysterectomy w/ bilateral salpingoophorectomy   . Abdominal hysterectomy   . Cholecystectomy     Current Outpatient Prescriptions  Medication Sig Dispense Refill    . acetaminophen (TYLENOL ARTHRITIS PAIN) 650 MG CR tablet Take 1,300 mg by mouth every 8 (eight) hours as needed. For pain      . aspirin 81 MG tablet Take 81 mg by mouth daily.        [name] atorvastatin (LIPITOR) 40 MG tablet Take 1 tablet (40 mg total) by mouth daily.  90 tablet  3  . bisacodyl (DULCOLAX) 5 MG EC tablet Take 10 mg by mouth at bedtime as needed. For constipation      . carvedilol (COREG) 25 MG tablet Take 1.5 tablets (37.5 mg total) by mouth 2 (two) times daily with a meal. Take one and one half tablet twice daily. PT WANTS BRAND ONLY  90 tablet  10  . furosemide (LASIX) 80 MG tablet Take 1 tablet (80 mg total) by mouth daily.  30 tablet  11  . gabapentin (NEURONTIN) 100 MG capsule Take 1 capsule (100 mg total) by mouth 3 (three) times daily. Start one capsule at bedtime for 3 days, then  Twice a day for 3 days, then  3 times a day  90 capsule  1  . insulin aspart (NOVOLOG) 100 UNIT/ML injection Inject 10-15 Units into the skin 3 (three) times daily before meals. Sliding scale.    15 units at breakfast, 10units at lunch,10 units at dinner      . insulin glargine (LANTUS) 100 UNIT/ML injection Inject 10-20 Units into the skin at bedtime. Per  sliding scale      . montelukast (SINGULAIR) 10 MG tablet Take 1 tablet (10 mg total) by mouth at bedtime.  30 tablet  5  . olmesartan (BENICAR) 40 MG tablet Take 1 tablet (40 mg total) by mouth daily.  30 tablet  11  . pantoprazole (PROTONIX) 40 MG tablet Take 40 mg by mouth 2 (two) times daily.      . Polyethylene Glycol 3350 (MIRALAX PO) Take 17 g by mouth daily. As directed       . potassium chloride (MICRO-K) 10 MEQ CR capsule Take 10 mEq by mouth daily.        . sitaGLIPtan-metformin (JANUMET) 50-500 MG per tablet Take 1 tablet by mouth 2 (two) times daily with a meal.  112 tablet  0  . venlafaxine (EFFEXOR XR) 150 MG 24 hr capsule Take 150 mg by mouth 2 (two) times daily.        [name] albuterol (PROVENTIL HFA;VENTOLIN HFA) 108 (90 BASE)  MCG/ACT inhaler Inhale 2 puffs into the lungs every 6 (six) hours as needed.      [name] DISCONTD: budesonide-formoterol (SYMBICORT) 80-4.5 MCG/ACT inhaler Inhale 2 puffs into the lungs 2 (two) times daily.  1 Inhaler  12    Allergies as of 08/08/2011 - Review Complete 08/08/2011  Allergen Reaction Noted  . Amlodipine Other (See Comments) 01/30/2011  . Ramipril Cough 09/26/2010  . Adhesive (tape) Hives 09/26/2010  . Penicillins Itching   . Codeine Rash 09/26/2010    Family History  Problem Relation Age of Onset  . Stroke Mother   . Breast cancer Sister   . Emphysema Sister   . Cancer Sister     breast  . Alcohol abuse Brother   . Colon cancer      uncles x 2  . Prostate cancer Brother     History   Social History  . Marital Status: Married    Spouse Name: N/A    Number of Children: 0  . Years of Education: N/A   Occupational History  .     Social History Main Topics  . Smoking status: Former Smoker -- 1.0 packs/day for 20 years    Types: Cigarettes    Quit date: 01/03/2007  . Smokeless tobacco: Never Used  . Alcohol Use: No  . Drug Use: No  . Sexually Active: Not Currently    Birth Control/ Protection: Surgical   Other Topics Concern  . Not on file   Social History Narrative  . No narrative on file      Physical Exam: BP 128/78  Pulse 81  Ht 5' 9.5" (1.765 m)  Wt 203 lb 6.4 oz (92.262 kg)  BMI 29.61 kg/m2  SpO2 95% Constitutional: generally well-appearing Psychiatric: alert and oriented x3 Abdomen: soft, nontender, nondistended, no obvious ascites, no peritoneal signs, normal bowel sounds     Assessment and plan: 66 y.o. female with worsening anemia, intermittent rectal bleeding, recent constipation  Her constipation has clearly improved with increased MiraLax dose. I'm a bit concerned that her anemia has gradually worsened and she does see intermittent rectal bleeding I think we should proceed with repeat colonoscopy. Her previous one was about 5-6  years ago. See those results summarized above. Her cardiac ejection fraction is 35% and therefore consider her ASA IV and we should proceed with colonoscopy at the hospital setting.

## 2011-08-08 NOTE — Patient Instructions (Addendum)
You will be set up for a colonoscopy for constipation, minor rectal bleeding (WL, + propofol, has low ejection fraction on heart, 35%).

## 2011-08-10 ENCOUNTER — Ambulatory Visit (INDEPENDENT_AMBULATORY_CARE_PROVIDER_SITE_OTHER): Payer: Medicare Other | Admitting: Internal Medicine

## 2011-08-10 ENCOUNTER — Other Ambulatory Visit (INDEPENDENT_AMBULATORY_CARE_PROVIDER_SITE_OTHER): Payer: Medicare Other

## 2011-08-10 ENCOUNTER — Encounter: Payer: Self-pay | Admitting: Internal Medicine

## 2011-08-10 VITALS — BP 130/74 | HR 84 | Temp 98.7°F | Resp 16 | Wt 199.0 lb

## 2011-08-10 DIAGNOSIS — D510 Vitamin B12 deficiency anemia due to intrinsic factor deficiency: Secondary | ICD-10-CM

## 2011-08-10 DIAGNOSIS — I10 Essential (primary) hypertension: Secondary | ICD-10-CM

## 2011-08-10 DIAGNOSIS — K921 Melena: Secondary | ICD-10-CM

## 2011-08-10 LAB — CBC WITH DIFFERENTIAL/PLATELET
Basophils Absolute: 0 10*3/uL (ref 0.0–0.1)
Basophils Relative: 0.4 % (ref 0.0–3.0)
Eosinophils Absolute: 0.1 10*3/uL (ref 0.0–0.7)
Lymphocytes Relative: 21.2 % (ref 12.0–46.0)
MCHC: 32.5 g/dL (ref 30.0–36.0)
Neutrophils Relative %: 69.4 % (ref 43.0–77.0)
RBC: 3.53 Mil/uL — ABNORMAL LOW (ref 3.87–5.11)
WBC: 5.1 10*3/uL (ref 4.5–10.5)

## 2011-08-10 LAB — BASIC METABOLIC PANEL
BUN: 7 mg/dL (ref 6–23)
Calcium: 8.8 mg/dL (ref 8.4–10.5)
GFR: 73.54 mL/min (ref >60.00)
Glucose, Bld: 176 mg/dL — ABNORMAL HIGH (ref 70–99)
Sodium: 137 mEq/L (ref 135–145)

## 2011-08-10 LAB — IBC PANEL
Saturation Ratios: 9.5 % — ABNORMAL LOW (ref 20.0–50.0)
Transferrin: 254.6 mg/dL (ref 212.0–360.0)

## 2011-08-10 MED ORDER — FERRAPLUS 90 90-1 MG PO TABS: 1.0000 | ORAL_TABLET | Freq: Every day | ORAL | Status: DC

## 2011-08-10 NOTE — Assessment & Plan Note (Signed)
Her BP is well controlled 

## 2011-08-10 NOTE — Assessment & Plan Note (Signed)
She has seen GI and a colonoscopy is scheduled

## 2011-08-10 NOTE — Assessment & Plan Note (Signed)
Iron level is low, start ferraplus

## 2011-08-10 NOTE — Progress Notes (Signed)
Subjective:    Patient ID: [name], female    DOB: 05-15-1945, 66 y.o.   MRN: [id number]  Anemia Presents for follow-up visit. Symptoms include malaise/fatigue and pallor. There has been no abdominal pain, anorexia, bruising/bleeding easily, confusion, fever, leg swelling, light-headedness, palpitations, paresthesias, pica or weight loss. Signs of blood loss that are present include hematochezia. Signs of blood loss that are not present include hematemesis, melena and vaginal bleeding.      Review of Systems  Constitutional: Positive for malaise/fatigue. Negative for fever, chills, weight loss, diaphoresis, activity change, appetite change, fatigue and unexpected weight change.  HENT: Positive for nosebleeds, congestion, rhinorrhea and postnasal drip. Negative for hearing loss, ear pain, sore throat, facial swelling, sneezing, drooling, mouth sores, neck pain, neck stiffness, dental problem, voice change, sinus pressure, tinnitus and ear discharge.   Eyes: Negative.   Respiratory: Negative for chest tightness.   Cardiovascular: Negative for palpitations.  Gastrointestinal: Positive for blood in stool, hematochezia and anal bleeding. Negative for nausea, vomiting, abdominal pain, diarrhea, constipation, melena, abdominal distention, rectal pain, anorexia and hematemesis.  Genitourinary: Negative.  Negative for vaginal bleeding.  Musculoskeletal: Positive for back pain and arthralgias. Negative for myalgias, joint swelling and gait problem.  Skin: Positive for pallor. Negative for color change, rash and wound.  Neurological: Negative for dizziness, syncope, speech difficulty, weakness, light-headedness and paresthesias.  Hematological: Negative for adenopathy. Does not bruise/bleed easily.  Psychiatric/Behavioral: Negative.  Negative for confusion.       Objective:   Physical Exam  Vitals reviewed. Constitutional: She is oriented to person, place, and time. She appears  well-developed and well-nourished.  Non-toxic appearance. She does not have a sickly appearance. She does not appear ill. No distress.  HENT:  Mouth/Throat: Mucous membranes are pale, not dry and not cyanotic. No oropharyngeal exudate, posterior oropharyngeal edema, posterior oropharyngeal erythema or tonsillar abscesses.  Eyes: Conjunctivae are normal. Right eye exhibits no discharge. Left eye exhibits no discharge. No scleral icterus.  Neck: Normal range of motion. Neck supple. No JVD present. No tracheal deviation present. No thyromegaly present.  Cardiovascular: Normal rate, regular rhythm and intact distal pulses.  Exam reveals no gallop and no friction rub.   Murmur heard. Pulmonary/Chest: Effort normal and breath sounds normal. No stridor. No respiratory distress. She has no wheezes. She has no rales. She exhibits no tenderness.  Abdominal: Soft. Bowel sounds are normal. She exhibits no distension and no mass. There is no tenderness. There is no rebound and no guarding.  Musculoskeletal: Normal range of motion. She exhibits no edema and no tenderness.  Lymphadenopathy:    She has no cervical adenopathy.  Neurological: She is oriented to person, place, and time.  Skin: Skin is warm and dry. No rash noted. She is not diaphoretic. No erythema. No pallor.  Psychiatric: She has a normal mood and affect. Her behavior is normal. Judgment and thought content normal.      Lab Results  Component Value Date   WBC 5.8 06/27/2011   HGB 10.5* 06/27/2011   HCT 32.3* 06/27/2011   PLT 388.0 06/27/2011   GLUCOSE 290* 06/27/2011   CHOL 152 09/18/2010   TRIG 71 09/18/2010   HDL 60 09/18/2010   LDLCALC 78 09/18/2010   ALT 10 06/27/2011   AST 16 06/27/2011   NA 134* 06/27/2011   K 4.1 06/27/2011   CL 94* 06/27/2011   CREATININE 1.1 06/27/2011   BUN 9 06/27/2011   CO2 29 06/27/2011   TSH 1.34 06/27/2011  INR 1.10 05/02/2011   HGBA1C 8.2* 06/27/2011   MICROALBUR 2.45* 03/15/2007      Assessment & Plan:

## 2011-08-10 NOTE — Patient Instructions (Signed)
Anemia, Nonspecific Your exam and blood tests show you are anemic. This means your blood (hemoglobin) level is low. Normal hemoglobin values are 12 to 15 g/dL for females and 14 to 17 g/dL for males. Make a note of your hemoglobin level today. The hematocrit percent is also used to measure anemia. A normal hematocrit is 38% to 46% in females and 42% to 49% in males. Make a note of your hematocrit level today. CAUSES  Anemia can be due to many different causes.  Excessive bleeding from periods (in women).   Intestinal bleeding.   Poor nutrition.   Kidney, thyroid, liver, and bone marrow diseases.  SYMPTOMS  Anemia can come on suddenly (acute). It can also come on slowly. Symptoms can include:  Minor weakness.   Dizziness.   Palpitations.   Shortness of breath.  Symptoms may be absent until half your hemoglobin is missing if it comes on slowly. Anemia due to acute blood loss from an injury or internal bleeding may require blood transfusion if the loss is severe. Hospital care is needed if you are anemic and there is significant continual blood loss. TREATMENT   Stool tests for blood (Hemoccult) and additional lab tests are often needed. This determines the best treatment.   Further checking on your condition and your response to treatment is very important. It often takes many weeks to correct anemia.  Depending on the cause, treatment can include:  Supplements of iron.   Vitamins B12 and folic acid.   Hormone medicines.If your anemia is due to bleeding, finding the cause of the blood loss is very important. This will help avoid further problems.  SEEK IMMEDIATE MEDICAL CARE IF:   You develop fainting, extreme weakness, shortness of breath, or chest pain.   You develop heavy vaginal bleeding.   You develop bloody or black, tarry stools or vomit up blood.   You develop a high fever, rash, repeated vomiting, or dehydration.  Document Released: 01/27/2004 Document Revised:  12/08/2010 Document Reviewed: 11/03/2008 [employer] Patient Information 2012 [employer]. 

## 2011-08-10 NOTE — Assessment & Plan Note (Signed)
She is doing well on janumet

## 2011-08-18 ENCOUNTER — Telehealth: Payer: Self-pay | Admitting: Gastroenterology

## 2011-08-21 ENCOUNTER — Encounter: Payer: Medicare Other | Admitting: Physical Medicine and Rehabilitation

## 2011-08-21 NOTE — Telephone Encounter (Signed)
Pt was advised she does not need to stop her Iron before her procedure, she does need to follow the instructions for the prep  Pt agrees

## 2011-08-24 ENCOUNTER — Encounter (HOSPITAL_COMMUNITY): Payer: Self-pay | Admitting: Gastroenterology

## 2011-08-24 ENCOUNTER — Encounter (HOSPITAL_COMMUNITY): Payer: Self-pay | Admitting: Anesthesiology

## 2011-08-24 ENCOUNTER — Ambulatory Visit (HOSPITAL_COMMUNITY): Payer: Medicare Other | Admitting: Anesthesiology

## 2011-08-24 ENCOUNTER — Encounter (HOSPITAL_COMMUNITY)
Admission: RE | Disposition: A | Discharge status: Home / Self Care | Payer: Self-pay | Source: Ambulatory Visit | Attending: Gastroenterology

## 2011-08-24 ENCOUNTER — Ambulatory Visit (HOSPITAL_COMMUNITY)
Admission: RE | Admit: 2011-08-24 | Discharge: 2011-08-24 | Disposition: A | Discharge status: Home / Self Care | Payer: Medicare Other | Source: Ambulatory Visit | Attending: Gastroenterology | Admitting: Gastroenterology

## 2011-08-24 DIAGNOSIS — K5730 Diverticulosis of large intestine without perforation or abscess without bleeding: Secondary | ICD-10-CM

## 2011-08-24 DIAGNOSIS — K5900 Constipation, unspecified: Secondary | ICD-10-CM

## 2011-08-24 DIAGNOSIS — K921 Melena: Secondary | ICD-10-CM

## 2011-08-24 DIAGNOSIS — K625 Hemorrhage of anus and rectum: Secondary | ICD-10-CM | POA: Insufficient documentation

## 2011-08-24 DIAGNOSIS — D649 Anemia, unspecified: Secondary | ICD-10-CM | POA: Insufficient documentation

## 2011-08-24 LAB — HM COLONOSCOPY: HM Colonoscopy: NORMAL

## 2011-08-24 SURGERY — COLONOSCOPY WITH PROPOFOL
Anesthesia: Monitor Anesthesia Care

## 2011-08-24 MED ORDER — LACTATED RINGERS IV SOLN
INTRAVENOUS | Status: DC
  Administered 2011-08-24: 10:00:00 via INTRAVENOUS

## 2011-08-24 MED ORDER — PROMETHAZINE HCL 25 MG/ML IJ SOLN: 6.2500 mg | INTRAMUSCULAR | Status: DC | PRN

## 2011-08-24 MED ORDER — PROPOFOL 10 MG/ML IV EMUL
INTRAVENOUS | Status: DC | PRN
  Administered 2011-08-24: 140 ug/kg/min via INTRAVENOUS

## 2011-08-24 MED ORDER — MIDAZOLAM HCL 5 MG/5ML IJ SOLN
INTRAMUSCULAR | Status: DC | PRN
  Administered 2011-08-24 (×2): 1 mg via INTRAVENOUS

## 2011-08-24 MED ORDER — KETAMINE HCL 10 MG/ML IJ SOLN
INTRAMUSCULAR | Status: DC | PRN
  Administered 2011-08-24: 20 mg via INTRAVENOUS

## 2011-08-24 MED ORDER — FENTANYL CITRATE 0.05 MG/ML IJ SOLN
INTRAMUSCULAR | Status: DC | PRN
  Administered 2011-08-24 (×2): 25 ug via INTRAVENOUS
  Administered 2011-08-24: 50 ug via INTRAVENOUS

## 2011-08-24 SURGICAL SUPPLY — 21 items

## 2011-08-24 NOTE — Anesthesia Preprocedure Evaluation (Addendum)
Anesthesia Evaluation  Patient identified by MRN, date of birth, ID band Patient awake    Reviewed: Allergy & Precautions, H&P , NPO status , Patient's Chart, lab work & pertinent test results  History of Anesthesia Complications Negative for: history of anesthetic complications  Airway Mallampati: II TM Distance: >3 FB Neck ROM: Full    Dental  (+) Edentulous Upper, Edentulous Lower and Dental Advisory Given   Pulmonary asthma , sleep apnea , former smoker,  breath sounds clear to auscultation  Pulmonary exam normal       Cardiovascular hypertension, Pt. on medications and Pt. on home beta blockers + CAD, + Past MI, + Cardiac Stents and +CHF Rhythm:Regular Rate:Normal     Neuro/Psych Depression  Neuromuscular disease    GI/Hepatic Neg liver ROS, GERD-  ,  Endo/Other  Type 2, Oral Hypoglycemic Agents  Renal/GU negative Renal ROS  negative genitourinary   Musculoskeletal negative musculoskeletal ROS (+)   Abdominal   Peds  Hematology negative hematology ROS (+)   Anesthesia Other Findings   Reproductive/Obstetrics negative OB ROS                          Anesthesia Physical Anesthesia Plan  ASA: III  Anesthesia Plan: MAC   Post-op Pain Management:    Induction: Intravenous  Airway Management Planned: Simple Face Mask  Additional Equipment:   Intra-op Plan:   Post-operative Plan:   Informed Consent: I have reviewed the patients History and Physical, chart, labs and discussed the procedure including the risks, benefits and alternatives for the proposed anesthesia with the patient or authorized representative who has indicated his/her understanding and acceptance.   Dental advisory given  Plan Discussed with: CRNA  Anesthesia Plan Comments:         Anesthesia Quick Evaluation

## 2011-08-24 NOTE — Interval H&P Note (Signed)
History and Physical Interval Note:  08/24/2011 10:04 AM  [name]  has presented today for surgery, with the diagnosis of Constipation [564.00] Rectal bleeding [569.3]  The various methods of treatment have been discussed with the patient and family. After consideration of risks, benefits and other options for treatment, the patient has consented to  Procedure(s) (LRB): COLONOSCOPY WITH PROPOFOL (N/A) as a surgical intervention .  The patient's history has been reviewed, patient examined, no change in status, stable for surgery.  I have reviewed the patient's chart and labs.  Questions were answered to the patient's satisfaction.     Rob Bunting

## 2011-08-24 NOTE — Op Note (Signed)
[facility] [address] [place], [address]   COLONOSCOPY PROCEDURE REPORT  PATIENT: [name], [name]  MR#: [id number] BIRTHDATE: Aug 20, 1945 , 66  yrs. old GENDER: Female ENDOSCOPIST: Rachael Fee, MD PROCEDURE DATE:  08/24/2011 PROCEDURE:   Colonoscopy, diagnostic ASA CLASS:   Class IV INDICATIONS:minor rectal bleeding, anemia, constipation. MEDICATIONS: MAC sedation, administered by CRNA  DESCRIPTION OF PROCEDURE:   After the risks benefits and alternatives of the procedure were thoroughly explained, informed consent was obtained.  A digital rectal exam revealed no abnormalities of the rectum.   The Pentax Colonoscope [id number] endoscope was introduced through the anus and advanced to the cecum, which was identified by both the appendix and ileocecal valve. No adverse events experienced.   The quality of the prep was good.  The instrument was then slowly withdrawn as the colon was fully examined.     COLON FINDINGS: Mild diverticulosis was noted in the sigmoid and descending colon.   The colon mucosa was otherwise normal. Retroflexed views revealed no abnormalities. =[      The scope was withdrawn and the procedure was completed. COMPLICATIONS: There were no complications.  ENDOSCOPIC IMPRESSION: 1.   Mild diverticulosis was noted in the left colon 2.   The colon mucosa was otherwise normal; no polyps or cancers  RECOMMENDATIONS: You should continue to follow colorectal cancer screening guidelines for "routine risk" patients with a repeat colonoscopy in 10 years. There is no need for FOBT (stool) testing for at least 5 years. You should continue daily miralax since it seems to be helping your constipation  eSigned:  Rachael Fee, MD 08/24/2011 10:52 AM   cc: [name], MD

## 2011-08-24 NOTE — H&P (View-Only) (Signed)
Review of pertinent gastrointestinal problems:  1. Likely diabetic gastroparesis causing nausea, bloating, some abdominal discomfort. I do not see any previous gastric emptying scans to prove this.  2. History of colon polyps. Last colonoscopy June 2008 by Dr. [place] found diverticulosis, tortuous colon, no polyps. He recommended she have a repeat colonoscopy in 8 years. I have not found polyp pathology in her chart. Probable diverticular bleed February, 2009. Admitted to the hospital with bright red blood per rectum, mild anemia, bleeding resolved without intervention. No need for blood transfusion.  3. Chronic constipation. Constipation imroving with daily Miralax.   HPI: This is a very pleasant 66-year-old woman whom I last saw about 2 months ago.  Has been taking miralax, two doses per day and single dulcolax.  She tried stopping dulcolax.    SHe has lost 47 pounds in many months, excercising.  Every once in a while she sees blood in her stool, just a couple drops occasionally.  CBCs recently show slow gradual trend down (now anemic, elevated RDW).  Hemoglobin was normal back in March, now her hemoglobin is 10.4. She is not microcytic.   Past Medical History  Diagnosis Date  . DM   . DYSLIPIDEMIA   . OBESITY NOS   . DEPRESSION, RECURRENT, IN PARTIAL REMISSION   . HYPERTENSION   . 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 CONGESTIVE HEART FAILURE     EF 35% echo 04/2011  . GERD   . DIVERTICULOSIS, COLON, WITH HEMORRHAGE   . BACK PAIN, LUMBAR, WITH RADICULOPATHY   . OBSTRUCTIVE SLEEP APNEA   . Asthma     Past Surgical History  Procedure Date  . Uvuloplasty   . Total abdominal hysterectomy w/ bilateral salpingoophorectomy   . Abdominal hysterectomy   . Cholecystectomy     Current Outpatient Prescriptions  Medication Sig Dispense Refill    . acetaminophen (TYLENOL ARTHRITIS PAIN) 650 MG CR tablet Take 1,300 mg by mouth every 8 (eight) hours as needed. For pain      . aspirin 81 MG tablet Take 81 mg by mouth daily.        . atorvastatin (LIPITOR) 40 MG tablet Take 1 tablet (40 mg total) by mouth daily.  90 tablet  3  . bisacodyl (DULCOLAX) 5 MG EC tablet Take 10 mg by mouth at bedtime as needed. For constipation      . carvedilol (COREG) 25 MG tablet Take 1.5 tablets (37.5 mg total) by mouth 2 (two) times daily with a meal. Take one and one half tablet twice daily. PT WANTS BRAND ONLY  90 tablet  10  . furosemide (LASIX) 80 MG tablet Take 1 tablet (80 mg total) by mouth daily.  30 tablet  11  . gabapentin (NEURONTIN) 100 MG capsule Take 1 capsule (100 mg total) by mouth 3 (three) times daily. Start one capsule at bedtime for 3 days, then  Twice a day for 3 days, then  3 times a day  90 capsule  1  . insulin aspart (NOVOLOG) 100 UNIT/ML injection Inject 10-15 Units into the skin 3 (three) times daily before meals. Sliding scale.    15 units at breakfast, 10units at lunch,10 units at dinner      . insulin glargine (LANTUS) 100 UNIT/ML injection Inject 10-20 Units into the skin at bedtime. Per   sliding scale      . montelukast (SINGULAIR) 10 MG tablet Take 1 tablet (10 mg total) by mouth at bedtime.  30 tablet  5  . olmesartan (BENICAR) 40 MG tablet Take 1 tablet (40 mg total) by mouth daily.  30 tablet  11  . pantoprazole (PROTONIX) 40 MG tablet Take 40 mg by mouth 2 (two) times daily.      . Polyethylene Glycol 3350 (MIRALAX PO) Take 17 g by mouth daily. As directed       . potassium chloride (MICRO-K) 10 MEQ CR capsule Take 10 mEq by mouth daily.        . sitaGLIPtan-metformin (JANUMET) 50-500 MG per tablet Take 1 tablet by mouth 2 (two) times daily with a meal.  112 tablet  0  . venlafaxine (EFFEXOR XR) 150 MG 24 hr capsule Take 150 mg by mouth 2 (two) times daily.        . albuterol (PROVENTIL HFA;VENTOLIN HFA) 108 (90 BASE)  MCG/ACT inhaler Inhale 2 puffs into the lungs every 6 (six) hours as needed.      . DISCONTD: budesonide-formoterol (SYMBICORT) 80-4.5 MCG/ACT inhaler Inhale 2 puffs into the lungs 2 (two) times daily.  1 Inhaler  12    Allergies as of 08/08/2011 - Review Complete 08/08/2011  Allergen Reaction Noted  . Amlodipine Other (See Comments) 01/30/2011  . Ramipril Cough 09/26/2010  . Adhesive (tape) Hives 09/26/2010  . Penicillins Itching   . Codeine Rash 09/26/2010    Family History  Problem Relation Age of Onset  . Stroke Mother   . Breast cancer Sister   . Emphysema Sister   . Cancer Sister     breast  . Alcohol abuse Brother   . Colon cancer      uncles x 2  . Prostate cancer Brother     History   Social History  . Marital Status: Married    Spouse Name: N/A    Number of Children: 0  . Years of Education: N/A   Occupational History  .     Social History Main Topics  . Smoking status: Former Smoker -- 1.0 packs/day for 20 years    Types: Cigarettes    Quit date: 01/03/2007  . Smokeless tobacco: Never Used  . Alcohol Use: No  . Drug Use: No  . Sexually Active: Not Currently    Birth Control/ Protection: Surgical   Other Topics Concern  . Not on file   Social History Narrative  . No narrative on file      Physical Exam: BP 128/78  Pulse 81  Ht 5' 9.5" (1.765 m)  Wt 203 lb 6.4 oz (92.262 kg)  BMI 29.61 kg/m2  SpO2 95% Constitutional: generally well-appearing Psychiatric: alert and oriented x3 Abdomen: soft, nontender, nondistended, no obvious ascites, no peritoneal signs, normal bowel sounds     Assessment and plan: 66 y.o. female with worsening anemia, intermittent rectal bleeding, recent constipation  Her constipation has clearly improved with increased MiraLax dose. I'm a bit concerned that her anemia has gradually worsened and she does see intermittent rectal bleeding I think we should proceed with repeat colonoscopy. Her previous one was about 5-6  years ago. See those results summarized above. Her cardiac ejection fraction is 35% and therefore consider her ASA IV and we should proceed with colonoscopy at the hospital setting.  

## 2011-08-24 NOTE — Preoperative (Signed)
Beta Blockers   Reason not to administer Beta Blockers:Not Applicable 

## 2011-08-24 NOTE — Transfer of Care (Signed)
Immediate Anesthesia Transfer of Care Note  Patient: [name]  Procedure(s) Performed: Procedure(s) (LRB): COLONOSCOPY WITH PROPOFOL (N/A)  Patient Location: PACU  Anesthesia Type: MAC  Level of Consciousness: awake, alert  and oriented  Airway & Oxygen Therapy: Patient Spontanous Breathing and Patient connected to face mask oxygen  Post-op Assessment: Report given to PACU RN and Post -op Vital signs reviewed and stable  Post vital signs: Reviewed and stable  Complications: No apparent anesthesia complications

## 2011-08-25 NOTE — Anesthesia Postprocedure Evaluation (Signed)
Anesthesia Post Note  Patient: [name]  Procedure(s) Performed: Procedure(s) (LRB): COLONOSCOPY WITH PROPOFOL (N/A)  Anesthesia type: MAC  Patient location: PACU  Post pain: Pain level controlled  Post assessment: Post-op Vital signs reviewed  Last Vitals:  Filed Vitals:   08/24/11 1134  BP: 168/85  Temp:   Resp: 18    Post vital signs: Reviewed  Level of consciousness: sedated  Complications: No apparent anesthesia complications

## 2011-09-07 ENCOUNTER — Ambulatory Visit (INDEPENDENT_AMBULATORY_CARE_PROVIDER_SITE_OTHER): Payer: Medicare Other | Admitting: Internal Medicine

## 2011-09-07 ENCOUNTER — Other Ambulatory Visit (INDEPENDENT_AMBULATORY_CARE_PROVIDER_SITE_OTHER): Payer: Medicare Other

## 2011-09-07 ENCOUNTER — Encounter: Payer: Self-pay | Admitting: Internal Medicine

## 2011-09-07 ENCOUNTER — Telehealth: Payer: Self-pay

## 2011-09-07 VITALS — BP 146/78 | HR 83 | Temp 98.7°F | Resp 16 | Wt 195.5 lb

## 2011-09-07 DIAGNOSIS — I10 Essential (primary) hypertension: Secondary | ICD-10-CM

## 2011-09-07 DIAGNOSIS — J45909 Unspecified asthma, uncomplicated: Secondary | ICD-10-CM

## 2011-09-07 DIAGNOSIS — I509 Heart failure, unspecified: Secondary | ICD-10-CM

## 2011-09-07 DIAGNOSIS — J4530 Mild persistent asthma, uncomplicated: Secondary | ICD-10-CM

## 2011-09-07 DIAGNOSIS — I2510 Atherosclerotic heart disease of native coronary artery without angina pectoris: Secondary | ICD-10-CM

## 2011-09-07 DIAGNOSIS — R0609 Other forms of dyspnea: Secondary | ICD-10-CM

## 2011-09-07 DIAGNOSIS — D509 Iron deficiency anemia, unspecified: Secondary | ICD-10-CM

## 2011-09-07 DIAGNOSIS — Z23 Encounter for immunization: Secondary | ICD-10-CM

## 2011-09-07 LAB — CBC WITH DIFFERENTIAL/PLATELET
Basophils Relative: 0.3 % (ref 0.0–3.0)
Eosinophils Relative: 1.7 % (ref 0.0–5.0)
HCT: 30 % — ABNORMAL LOW (ref 36.0–46.0)
Hemoglobin: 9.8 g/dL — ABNORMAL LOW (ref 12.0–15.0)
Lymphs Abs: 1.1 10*3/uL (ref 0.7–4.0)
MCV: 86.4 fl (ref 78.0–100.0)
Monocytes Absolute: 0.4 10*3/uL (ref 0.1–1.0)
Monocytes Relative: 5.9 % (ref 3.0–12.0)
Platelets: 400 10*3/uL (ref 150.0–400.0)
RBC: 3.48 Mil/uL — ABNORMAL LOW (ref 3.87–5.11)
WBC: 6 10*3/uL (ref 4.5–10.5)

## 2011-09-07 LAB — BASIC METABOLIC PANEL
Chloride: 95 mEq/L — ABNORMAL LOW (ref 96–112)
GFR: 73.52 mL/min (ref >60.00)
Potassium: 3.7 mEq/L (ref 3.5–5.1)
Sodium: 135 mEq/L (ref 135–145)

## 2011-09-07 LAB — D-DIMER, QUANTITATIVE: D-Dimer, Quant: 1.44 ug/mL-FEU — ABNORMAL HIGH (ref 0.00–0.48)

## 2011-09-07 LAB — TROPONIN I: Troponin I: 0.02 ng/mL (ref <0.06)

## 2011-09-07 LAB — FERRITIN: Ferritin: 24.1 ng/mL (ref 10.0–291.0)

## 2011-09-07 LAB — BRAIN NATRIURETIC PEPTIDE: Pro B Natriuretic peptide (BNP): 752 pg/mL — ABNORMAL HIGH (ref 0.0–100.0)

## 2011-09-07 LAB — IBC PANEL: Saturation Ratios: 23.3 % (ref 20.0–50.0)

## 2011-09-07 MED ORDER — MOMETASONE FURO-FORMOTEROL FUM 100-5 MCG/ACT IN AERO: 2.0000 | INHALATION_SPRAY | Freq: Two times a day (BID) | RESPIRATORY_TRACT | Status: DC

## 2011-09-07 NOTE — Progress Notes (Signed)
Subjective:    Patient ID: [name], female    DOB: 1945-12-13, 66 y.o.   MRN: [id number]  Anemia Presents for follow-up visit. Symptoms include malaise/fatigue and pallor. There has been no abdominal pain, anorexia, bruising/bleeding easily, confusion, fever, leg swelling, light-headedness, palpitations, paresthesias, pica or weight loss. Signs of blood loss that are not present include hematemesis, hematochezia, melena and vaginal bleeding. There are no compliance problems.       Review of Systems  Constitutional: Positive for malaise/fatigue, appetite change (some loss of appetite) and unexpected weight change (some weight loss). Negative for fever, chills, weight loss, diaphoresis, activity change and fatigue.  HENT: Negative.   Eyes: Negative.   Respiratory: Positive for shortness of breath and wheezing. Negative for apnea, cough, choking, chest tightness and stridor.   Cardiovascular: Negative for chest pain, palpitations and leg swelling.  Gastrointestinal: Negative for nausea, vomiting, abdominal pain, diarrhea, constipation, blood in stool, melena, hematochezia, abdominal distention, anorexia and hematemesis.  Genitourinary: Negative.  Negative for vaginal bleeding.  Musculoskeletal: Positive for back pain (chronic, unchanged) and arthralgias. Negative for myalgias, joint swelling and gait problem.  Skin: Positive for pallor. Negative for color change, rash and wound.  Neurological: Negative for dizziness, tremors, seizures, syncope, facial asymmetry, speech difficulty, weakness, light-headedness, numbness, headaches and paresthesias.  Hematological: Negative for adenopathy. Does not bruise/bleed easily.  Psychiatric/Behavioral: Negative.  Negative for confusion.       Objective:   Physical Exam  Vitals reviewed. Constitutional: She is oriented to person, place, and time. She appears well-developed and well-nourished. No distress.  HENT:  Head: Normocephalic and  atraumatic.  Mouth/Throat: Oropharynx is clear and moist. No oropharyngeal exudate.  Eyes: Conjunctivae are normal. Right eye exhibits no discharge. Left eye exhibits no discharge. No scleral icterus.  Neck: Normal range of motion. Neck supple. No JVD present. No tracheal deviation present. No thyromegaly present.  Cardiovascular: Normal rate, regular rhythm, normal heart sounds and intact distal pulses.  Exam reveals no gallop and no friction rub.   No murmur heard. Pulmonary/Chest: Breath sounds normal. No accessory muscle usage or stridor. Tachypnea noted. No respiratory distress. She has no decreased breath sounds. She has no wheezes. She has no rhonchi. She has no rales. She exhibits no tenderness.  Abdominal: Soft. Bowel sounds are normal. She exhibits no distension and no mass. There is no tenderness. There is no rebound and no guarding.  Musculoskeletal: Normal range of motion. She exhibits edema (trace edema in BLE). She exhibits no tenderness.  Lymphadenopathy:    She has no cervical adenopathy.  Neurological: She is oriented to person, place, and time.  Skin: Skin is warm and dry. No rash noted. She is not diaphoretic. No erythema. No pallor.  Psychiatric: She has a normal mood and affect. Her behavior is normal. Judgment and thought content normal.     Lab Results  Component Value Date   WBC 5.1 08/10/2011   HGB 10.0* 08/10/2011   HCT 30.7* 08/10/2011   PLT 377.0 08/10/2011   GLUCOSE 176* 08/10/2011   CHOL 152 09/18/2010   TRIG 71 09/18/2010   HDL 60 09/18/2010   LDLCALC 78 09/18/2010   ALT 10 06/27/2011   AST 16 06/27/2011   NA 137 08/10/2011   K 3.7 08/10/2011   CL 97 08/10/2011   CREATININE 1.0 08/10/2011   BUN 7 08/10/2011   CO2 32 08/10/2011   TSH 1.34 06/27/2011   INR 1.10 05/02/2011   HGBA1C 8.2* 06/27/2011   MICROALBUR 2.45* 03/15/2007  Assessment & Plan:

## 2011-09-07 NOTE — Telephone Encounter (Signed)
FYI.Marland KitchenMarland KitchenSoltas lab call reporting a D-Dimer of 1.44 and troponin of 0.02

## 2011-09-07 NOTE — Assessment & Plan Note (Addendum)
EKG is unchanged, I will check labs to look for fluid retention ----> BNP is elevated so I have asked her to have a f/up with cardiology

## 2011-09-07 NOTE — Assessment & Plan Note (Signed)
Her EKG is unchanged today, I will check labs to look for PE, CHF, worsening anemia. Will also restart dulera if this is being caused by COPD.

## 2011-09-07 NOTE — Patient Instructions (Signed)

## 2011-09-07 NOTE — Assessment & Plan Note (Signed)
She will restart dulera today to see if that helps with her symptoms

## 2011-09-07 NOTE — Assessment & Plan Note (Signed)
This appears to be worsening for her, her BNP is high today but her EKG appears to be unchanged, I have asked her to have a f.up with cardiology

## 2011-09-07 NOTE — Assessment & Plan Note (Signed)
I will recheck her CBC and iron levels today 

## 2011-09-07 NOTE — Telephone Encounter (Signed)
noted 

## 2011-09-21 ENCOUNTER — Encounter: Payer: Self-pay | Admitting: Cardiology

## 2011-09-21 ENCOUNTER — Ambulatory Visit (INDEPENDENT_AMBULATORY_CARE_PROVIDER_SITE_OTHER): Payer: Medicare Other | Admitting: Cardiology

## 2011-09-21 VITALS — BP 138/66 | HR 78 | Ht 69.0 in | Wt 185.8 lb

## 2011-09-21 DIAGNOSIS — I509 Heart failure, unspecified: Secondary | ICD-10-CM

## 2011-09-21 DIAGNOSIS — E785 Hyperlipidemia, unspecified: Secondary | ICD-10-CM

## 2011-09-21 DIAGNOSIS — I25810 Atherosclerosis of coronary artery bypass graft(s) without angina pectoris: Secondary | ICD-10-CM

## 2011-09-21 DIAGNOSIS — I2510 Atherosclerotic heart disease of native coronary artery without angina pectoris: Secondary | ICD-10-CM

## 2011-09-21 DIAGNOSIS — I10 Essential (primary) hypertension: Secondary | ICD-10-CM

## 2011-09-21 MED ORDER — ISOSORBIDE MONONITRATE ER 30 MG PO TB24: 30.0000 mg | ORAL_TABLET | Freq: Every day | ORAL | Status: DC

## 2011-09-21 NOTE — Progress Notes (Signed)
HPI The patient returns for followup of cardiomyopathy.  She saw Dr. Yetta Barre recently. He's managing some chronic lung disease unchanged the medications. However, he also check labs that demonstrated an elevated pro BNP.   This was nondiagnostic. He was greater than 700 which was less than previous at 1100. She unfortunately continues to have multiple complaints. She is dyspneic episodically. 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 However, she has felt to migrate into her chest and around her heart. She has had chest discomfort. This happens episodically. There are nights when she cannot lie flat as it is uncomfortable. She hasn't had one of these in a week. She does get dyspneic when this happens. Usually she sleeps on 3 pillows. She's not having a new presyncope or syncope. She's had weight loss of about 10 pounds.   Allergies  Allergen Reactions  . Amlodipine Other (See Comments)    constipation  . Ramipril Cough  . Adhesive (Tape) Hives    "peels skin off"  . Penicillins Itching  . Codeine Rash    Current Outpatient Prescriptions  Medication Sig Dispense Refill  . acetaminophen (TYLENOL ARTHRITIS PAIN) 650 MG CR tablet Take 1,300 mg by mouth every 8 (eight) hours as needed. For pain      . albuterol (PROVENTIL HFA;VENTOLIN HFA) 108 (90 BASE) MCG/ACT inhaler Inhale 2 puffs into the lungs every 6 (six) hours as needed.      [name] aspirin 81 MG tablet Take 81 mg by mouth daily.        [name] atorvastatin (LIPITOR) 40 MG tablet Take 1 tablet (40 mg total) by mouth daily.  90 tablet  3  . bisacodyl (DULCOLAX) 5 MG EC tablet Take 10 mg by mouth at bedtime as needed. For constipation      . carvedilol (COREG) 25 MG tablet Take 1.5 tablets (37.5 mg total) by mouth 2 (two) times daily with a meal. Take one and one half tablet twice daily. PT WANTS BRAND ONLY  90 tablet  10  . furosemide (LASIX) 80 MG tablet Take 1 tablet (80 mg total) by mouth daily.  30  tablet  11  . gabapentin (NEURONTIN) 100 MG capsule Take 1 capsule (100 mg total) by mouth 3 (three) times daily. Start one capsule at bedtime for 3 days, then  Twice a day for 3 days, then  3 times a day  90 capsule  1  . insulin aspart (NOVOLOG) 100 UNIT/ML injection Inject 10-15 Units into the skin 3 (three) times daily before meals. Sliding scale.    15 units at breakfast, 10units at lunch,10 units at dinner      . insulin glargine (LANTUS) 100 UNIT/ML injection Inject 10-20 Units into the skin at bedtime. Per sliding scale      . Iron-Folic Acid-B12-C-Docusate (FERRAPLUS 90) 90-1 MG TABS Take 1 tablet by mouth daily.  90 tablet  3  . mometasone-formoterol (DULERA) 100-5 MCG/ACT AERO Inhale 2 puffs into the lungs 2 (two) times daily.  3 Inhaler  0  . montelukast (SINGULAIR) 10 MG tablet Take 1 tablet (10 mg total) by mouth at bedtime.  30 tablet  5  . olmesartan (BENICAR) 40 MG tablet Take 1 tablet (40 mg total) by mouth daily.  30 tablet  11  . pantoprazole (PROTONIX) 40 MG tablet Take 40 mg by mouth 2 (two) times daily.      . Polyethylene Glycol 3350 (MIRALAX PO) Take  17 g by mouth daily. As directed       . potassium chloride (MICRO-K) 10 MEQ CR capsule Take 10 mEq by mouth daily.        . sitaGLIPtan-metformin (JANUMET) 50-500 MG per tablet Take 1 tablet by mouth 2 (two) times daily with a meal.  112 tablet  0  . traZODone (DESYREL) 100 MG tablet Take 100 mg by mouth at bedtime. 1 and 1/2 pills at hs      . venlafaxine (EFFEXOR XR) 150 MG 24 hr capsule Take 150 mg by mouth 2 (two) times daily.        [name] DISCONTD: budesonide-formoterol (SYMBICORT) 80-4.5 MCG/ACT inhaler Inhale 2 puffs into the lungs 2 (two) times daily.  1 Inhaler  12    Past Medical History  Diagnosis Date  . DM   . DYSLIPIDEMIA   . OBESITY NOS   . DEPRESSION, RECURRENT, IN PARTIAL REMISSION   . HYPERTENSION   . CORONARY ARTERY DISEASE     S/p Q wave MI 2002 with RCA stent and AngioJet   Cath 4/30 99% distal LAD  stenosis unchanged from previous, 20% left main stenosis, patent stents  . CONGESTIVE HEART FAILURE     EF 35% echo 04/2011  . GERD   . DIVERTICULOSIS, COLON, WITH HEMORRHAGE   . BACK PAIN, LUMBAR, WITH RADICULOPATHY   . OBSTRUCTIVE SLEEP APNEA   . Asthma     Past Surgical History  Procedure Date  . Uvuloplasty   . Total abdominal hysterectomy w/ bilateral salpingoophorectomy   . Abdominal hysterectomy   . Cholecystectomy   . Coronary angioplasty with stent placement 2002    ROS  As stated in the HPI and negative for all other systems.  PHYSICAL EXAM BP 138/66  Pulse 78  Ht 5\' 9"  (1.753 m)  Wt 185 lb 12.8 oz (84.278 kg)  BMI 27.44 kg/m2 GENERAL:  Well appearing HEENT:  Pupils equal round and reactive, fundi not visualized, oral mucosa unremarkable NECK:  No jugular venous distention, waveform within normal limits, carotid upstroke brisk and symmetric, no bruits, no thyromegaly LYMPHATICS:  No cervical, inguinal adenopathy LUNGS:  Clear to auscultation bilaterally BACK:  No CVA tenderness CHEST:  Unremarkable HEART:  PMI not displaced or sustained,S1 and S2 within normal limits, no S3, no S4, no clicks, no rubs, no murmurs ABD:  Flat, positive bowel sounds normal in frequency in pitch, no bruits, no rebound, no guarding, no midline pulsatile mass, no hepatomegaly, no splenomegaly, obese EXT:  2 plus pulses throughout, no edema, no cyanosis no clubbing, 10 by 12 mobile soft nonpulsatile mass medial aspect of the antecubital fossa  SKIN:  No rashes no nodules NEURO:  Cranial nerves II through XII grossly intact, motor grossly intact throughout PSYCH:  Cognitively intact, oriented to person place and time  EKG:   Sinus rhythm, rate 78, axis within normal limits, QTC prolonged, first degree AV block, inferolateral T wave inversions unchanged from previous.09/21/2011   ASSESSMENT AND PLAN  CORONARY ARTERY DISEASE -  The patient did have non-obstructive and distal vessel disease  when she had her catheterization in late April. Her chest pain is somewhat atypical but I will go ahead and start Imdur 30 mg daily to see if this helps. Further management will be based on future symptoms.   HYPERTENSION -  Her blood pressure is labile. I will not adjust her medications further. The treatment will be BP control.  CONGESTIVE HEART FAILURE -  She seems to be  euvolemic today despite the elevated pro BNP.  Of note she's had a normal BNP in the past.  However, they have not changed the assay and her pro BNP was actually more elevated a few months ago than it is currently. I will have her take an extra 40 mg of Lasix and 10 mEq of potassium for the next 2 days.   DYSPNEA I suspect this is multifactorial. It's probably in part related to tobacco. She understands the need to stop smoking completely. We discussed this again today.

## 2011-09-21 NOTE — Patient Instructions (Addendum)
Please take extra Lasix 40 mg a day for 2 days, take extra Potassium Chl 10 MEQ a day for 2 days.   Start Imdur (isosorbide)30 mg a day. Continue all other medications as listed.  Follow up with Dr Antoine Poche in 3 months.

## 2011-09-27 ENCOUNTER — Ambulatory Visit (INDEPENDENT_AMBULATORY_CARE_PROVIDER_SITE_OTHER): Payer: Medicare Other | Admitting: Pulmonary Disease

## 2011-09-27 ENCOUNTER — Encounter: Payer: Self-pay | Admitting: Pulmonary Disease

## 2011-09-27 VITALS — BP 140/74 | HR 78 | Temp 98.3°F | Ht 69.0 in | Wt 183.0 lb

## 2011-09-27 DIAGNOSIS — J45909 Unspecified asthma, uncomplicated: Secondary | ICD-10-CM

## 2011-09-27 DIAGNOSIS — G4733 Obstructive sleep apnea (adult) (pediatric): Secondary | ICD-10-CM

## 2011-09-27 DIAGNOSIS — J4530 Mild persistent asthma, uncomplicated: Secondary | ICD-10-CM

## 2011-09-27 MED ORDER — ALBUTEROL SULFATE HFA 108 (90 BASE) MCG/ACT IN AERS: 2.0000 | INHALATION_SPRAY | Freq: Four times a day (QID) | RESPIRATORY_TRACT | Status: DC | PRN

## 2011-09-27 NOTE — Assessment & Plan Note (Signed)
She is compliant with CPAP and reports benefit. 

## 2011-09-27 NOTE — Assessment & Plan Note (Signed)
Stable on her current regimen.  I have renewed her albuterol.

## 2011-09-27 NOTE — Patient Instructions (Signed)
Follow up in 6 months 

## 2011-09-27 NOTE — Progress Notes (Signed)
Chief Complaint  Patient presents with  . Follow-up    have good and bad days with breathing. since last visit she has woke up twice gasping for air. c/o chest tx denies any cough, wheezing    History of Present Illness: [name] is a 66 y.o. female former smoker Chronic bronchitis/asthma, and OSA.  She has been doing okay.  She ran out of albuterol.  She gets winded at times with cough.  She is using dulera twice per day, and singulair at night.  Her leg swelling is better since she had her lasix increased.  She is doing well with her CPAP.  She already got a flu shot.  Tests: PSG 03/28/06>>AHI 15  CPAP 10 cm CPAP 02/18/11 to 03/29/11>>Used on 17 of 30 nights with median 4 hrs 19 min. Spirometry 03/09/11>>FEV1 2.02 (88%), FEV1% 76  Past Medical History  Diagnosis Date  . DM   . DYSLIPIDEMIA   . OBESITY NOS   . DEPRESSION, RECURRENT, IN PARTIAL REMISSION   . HYPERTENSION   . 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 CONGESTIVE HEART FAILURE     EF 35% echo 04/2011  . GERD   . DIVERTICULOSIS, COLON, WITH HEMORRHAGE   . BACK PAIN, LUMBAR, WITH RADICULOPATHY   . OBSTRUCTIVE SLEEP APNEA   . Asthma     Past Surgical History  Procedure Date  . Uvuloplasty   . Total abdominal hysterectomy w/ bilateral salpingoophorectomy   . Abdominal hysterectomy   . Cholecystectomy   . Coronary angioplasty with stent placement 2002    Allergies  Allergen Reactions  . Amlodipine Other (See Comments)    constipation  . Ramipril Cough  . Adhesive (Tape) Hives    "peels skin off"  . Penicillins Itching  . Codeine Rash    Physical Exam:  Blood pressure 140/74, pulse 78, temperature 98.3 F (36.8 C), temperature source Oral, height 5\' 9"  (1.753 m), weight 183 lb (83.008 kg), SpO2 93.00%.  Body mass index is 27.02 kg/(m^2).  Wt Readings from  Last 2 Encounters:  09/27/11 183 lb (83.008 kg)  09/21/11 185 lb 12.8 oz (84.278 kg)    General - Obese  HEENT - no sinus tenderness, no oral exudate, no LAN Cardiac - s1s2 regular, no murmur Chest - decreased breath sounds, no wheeze/rales/dullness Abdomen - soft, non-tender Extremities - no edema Skin - no rashes Neurologic - normal strength Psychiatric - normal mood, behavior  Assessment/Plan:  Outpatient Encounter Prescriptions as of 09/27/2011  Medication Sig Dispense Refill  . acetaminophen (TYLENOL ARTHRITIS PAIN) 650 MG CR tablet Take 1,300 mg by mouth every 8 (eight) hours as needed. For pain      . albuterol (PROVENTIL HFA;VENTOLIN HFA) 108 (90 BASE) MCG/ACT inhaler Inhale 2 puffs into the lungs every 6 (six) hours as needed.      [name] aspirin 81 MG tablet Take 81 mg by mouth daily.        [name] atorvastatin (LIPITOR) 40 MG tablet Take 1 tablet (40 mg total) by mouth daily.  90 tablet  3  . bisacodyl (DULCOLAX) 5 MG EC tablet Take 10 mg by mouth at bedtime as needed. For constipation      . carvedilol (COREG) 25 MG tablet Take 1.5 tablets (37.5 mg total) by mouth 2 (two) times daily with a meal. Take  one and one half tablet twice daily. PT WANTS BRAND ONLY  90 tablet  10  . furosemide (LASIX) 80 MG tablet Take 1 tablet (80 mg total) by mouth daily.  30 tablet  11  . gabapentin (NEURONTIN) 100 MG capsule Take 1 capsule (100 mg total) by mouth 3 (three) times daily. Start one capsule at bedtime for 3 days, then  Twice a day for 3 days, then  3 times a day  90 capsule  1  . insulin aspart (NOVOLOG) 100 UNIT/ML injection Inject 10-15 Units into the skin 3 (three) times daily before meals. Sliding scale.    15 units at breakfast, 10units at lunch,10 units at dinner      . insulin glargine (LANTUS) 100 UNIT/ML injection Inject 10-20 Units into the skin at bedtime. Per sliding scale      . Iron-Folic Acid-B12-C-Docusate (FERRAPLUS 90) 90-1 MG TABS Take 1 tablet by mouth daily.  90 tablet   3  . isosorbide mononitrate (IMDUR) 30 MG 24 hr tablet Take 1 tablet (30 mg total) by mouth daily.  90 tablet  3  . mometasone-formoterol (DULERA) 100-5 MCG/ACT AERO Inhale 2 puffs into the lungs 2 (two) times daily.  3 Inhaler  0  . montelukast (SINGULAIR) 10 MG tablet Take 1 tablet (10 mg total) by mouth at bedtime.  30 tablet  5  . olmesartan (BENICAR) 40 MG tablet Take 1 tablet (40 mg total) by mouth daily.  30 tablet  11  . pantoprazole (PROTONIX) 40 MG tablet Take 40 mg by mouth 2 (two) times daily.      . Polyethylene Glycol 3350 (MIRALAX PO) Take 17 g by mouth daily. As directed       . potassium chloride (MICRO-K) 10 MEQ CR capsule Take 10 mEq by mouth daily.        . sitaGLIPtan-metformin (JANUMET) 50-500 MG per tablet Take 1 tablet by mouth 2 (two) times daily with a meal.  112 tablet  0  . traZODone (DESYREL) 100 MG tablet Take 100 mg by mouth at bedtime. 1 and 1/2 pills at hs      . venlafaxine (EFFEXOR XR) 150 MG 24 hr capsule Take 150 mg by mouth 2 (two) times daily.          Arnisha Laffoon Pager:  [phone] 09/27/2011, 3:29 PM

## 2011-10-02 ENCOUNTER — Ambulatory Visit: Payer: Medicare Other | Admitting: Cardiology

## 2011-10-06 ENCOUNTER — Ambulatory Visit: Payer: Medicare Other | Admitting: Cardiology

## 2011-10-13 ENCOUNTER — Telehealth: Payer: Self-pay | Admitting: Internal Medicine

## 2011-10-13 DIAGNOSIS — M5412 Radiculopathy, cervical region: Secondary | ICD-10-CM

## 2011-10-13 NOTE — Telephone Encounter (Signed)
Pt is requesting a referral to a doctor who can take care of her arthritis.

## 2011-10-13 NOTE — Telephone Encounter (Signed)
done

## 2011-10-13 NOTE — Telephone Encounter (Signed)
Her arms and shoulders are hurting.

## 2011-10-13 NOTE — Telephone Encounter (Signed)
Which part of her body?

## 2011-10-20 ENCOUNTER — Encounter: Payer: Self-pay | Admitting: Physical Medicine and Rehabilitation

## 2011-10-20 ENCOUNTER — Encounter
Payer: Medicare Other | Attending: Physical Medicine and Rehabilitation | Admitting: Physical Medicine and Rehabilitation

## 2011-10-20 VITALS — BP 138/69 | HR 80 | Resp 16 | Ht 69.0 in | Wt 181.0 lb

## 2011-10-20 DIAGNOSIS — M542 Cervicalgia: Secondary | ICD-10-CM | POA: Insufficient documentation

## 2011-10-20 DIAGNOSIS — Z5987 Material hardship due to limited financial resources, not elsewhere classified: Secondary | ICD-10-CM | POA: Insufficient documentation

## 2011-10-20 DIAGNOSIS — M5030 Other cervical disc degeneration, unspecified cervical region: Secondary | ICD-10-CM | POA: Insufficient documentation

## 2011-10-20 DIAGNOSIS — M47812 Spondylosis without myelopathy or radiculopathy, cervical region: Secondary | ICD-10-CM | POA: Insufficient documentation

## 2011-10-20 DIAGNOSIS — M79609 Pain in unspecified limb: Secondary | ICD-10-CM | POA: Insufficient documentation

## 2011-10-20 DIAGNOSIS — Z598 Other problems related to housing and economic circumstances: Secondary | ICD-10-CM | POA: Insufficient documentation

## 2011-10-20 MED ORDER — METHOCARBAMOL 500 MG PO TABS: 500.0000 mg | ORAL_TABLET | Freq: Three times a day (TID) | ORAL | Status: DC

## 2011-10-20 MED ORDER — GABAPENTIN (ONCE-DAILY) 300 MG PO TABS: 1.0000 | ORAL_TABLET | Freq: Three times a day (TID) | ORAL | Status: DC

## 2011-10-20 MED ORDER — KETOROLAC TROMETHAMINE 60 MG/2ML IM SOLN
60.0000 mg | Freq: Once | INTRAMUSCULAR | Status: AC
  Administered 2011-10-20: 60 mg via INTRAMUSCULAR

## 2011-10-20 MED ORDER — MELOXICAM 7.5 MG PO TABS: 7.5000 mg | ORAL_TABLET | Freq: Two times a day (BID) | ORAL | Status: DC

## 2011-10-20 NOTE — Patient Instructions (Signed)
Use a heating pad for pain relief, then take your medication, when you get them.

## 2011-10-20 NOTE — Progress Notes (Signed)
Subjective:    Patient ID: [name], female    DOB: 07/25/1945, 66 y.o.   MRN: [id number]  HPI The patient is a 66 year old female, who presents with neck pain and right arm pain . The symptoms started 10 days ago, without no obvious reason. The patient complains about severe pain   , which radiates into her entire right arm in a non radicular pattern. Patient also complains about numbness and tingling in the same distribution, mainly in her fingers .  She describes the pain as throbing  . Applying heat, taking medications , changing positions alleviate the symptoms. Prolonged staying in one  position   aggrevates the symptoms. The patient grades his pain as a  10/10. Pain Inventory Average Pain 10 Pain Right Now 10 My pain is sharp, stabbing and tingling  In the last 24 hours, has pain interfered with the following? General activity 10 Relation with others 9 Enjoyment of life 9 What TIME of day is your pain at its worst? all the time Sleep (in general) Fair  Pain is worse with: walking, sitting and standing Pain improves with: heat/ice and medication Relief from Meds: 5  Mobility walk with assistance use a cane use a walker how many minutes can you walk? 2 ability to climb steps?  no do you drive?  no Do you have any goals in this area?  yes  Function not employed: date last employed 1997 retired I need assistance with the following:  bathing, meal prep, household duties and shopping Do you have any goals in this area?  no  Neuro/Psych bowel control problems weakness numbness tremor tingling trouble walking spasms dizziness  Prior Studies Any changes since last visit?  no  Physicians involved in your care Any changes since last visit?  no   Family History  Problem Relation Age of Onset  . Stroke Mother   . Breast cancer Sister   . Emphysema Sister   . Cancer Sister     breast  . Alcohol abuse Brother   . Colon cancer      uncles x 2  .  Prostate cancer Brother    History   Social History  . Marital Status: Married    Spouse Name: N/A    Number of Children: 0  . Years of Education: N/A   Occupational History  .     Social History Main Topics  . Smoking status: Current Some Day Smoker -- 1.0 packs/day for 20 years    Types: Cigarettes    Last Attempt to Quit: 01/03/2007  . Smokeless tobacco: Never Used  . Alcohol Use: No  . Drug Use: No  . Sexually Active: Not Currently    Birth Control/ Protection: Surgical   Other Topics Concern  . None   Social History Narrative  . None   Past Surgical History  Procedure Date  . Uvuloplasty   . Total abdominal hysterectomy w/ bilateral salpingoophorectomy   . Abdominal hysterectomy   . Cholecystectomy   . Coronary angioplasty with stent placement 2002   Past Medical History  Diagnosis Date  . DM   . DYSLIPIDEMIA   . OBESITY NOS   . DEPRESSION, RECURRENT, IN PARTIAL REMISSION   . HYPERTENSION   . 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 CONGESTIVE HEART FAILURE  EF 35% echo 04/2011  . GERD   . DIVERTICULOSIS, COLON, WITH HEMORRHAGE   . BACK PAIN, LUMBAR, WITH RADICULOPATHY   . OBSTRUCTIVE SLEEP APNEA   . Asthma    BP 138/69  Pulse 80  Resp 16  Ht 5\' 9"  (1.753 m)  Wt 181 lb (82.101 kg)  BMI 26.73 kg/m2  SpO2 93%     Review of Systems  Constitutional: Positive for diaphoresis and unexpected weight change.  Respiratory: Positive for shortness of breath and wheezing.   Gastrointestinal: Positive for nausea and constipation.  Musculoskeletal: Positive for myalgias, back pain, joint swelling, arthralgias and gait problem.  Neurological: Positive for dizziness, weakness and numbness.  All other systems reviewed and are negative.       Objective:   Physical Exam  Constitutional: She is oriented to person,  place, and time. She appears well-developed and well-nourished.       Walks with a cane  HENT:  Head: Normocephalic.  Neck: Neck supple.  Musculoskeletal: She exhibits tenderness.  Neurological: She is alert and oriented to person, place, and time.  Skin: Skin is warm and dry.  Psychiatric: She has a normal mood and affect.    Symmetric normal motor tone is noted throughout, except increased tone in neck muscles bilateral. Normal muscle bulk. Muscle testing reveals 5/5 muscle strength of the upper extremity, and 5/5 of the lower extremity. Full range of motion in upper and lower extremities. ROM of C-spine is mildly restricted. Fine motor movements are normal in both hands. Sensory is intact and symmetric to light touch, pinprick and proprioception. DTR in the upper and lower extremity are present and symmetric 1+. No clonus is noted.  Patient arises from chair with mild difficulty. Narrow based gait with with a cane.  Some swelling in her arms at the medial side around her elbow, bilateral symmetric, feels like swollen lymph nodes.         Assessment & Plan:  MRI cervical 03/02/11  1. Cervical spondylosis and degenerative disc disease results and  very degrees of foraminal impingement at all levels between C2 and  C7. Radiating pain and numbness in a non-radicular pattern.  2. Despite efforts by the patient and technologist, motion  artifact is present on some series of today's examination and could  not be totally eliminated. This reduces diagnostic sensitivity and  Specificity.  PLAN Advised patient to rest her head, also to keep her head in a good posture. Suggested PT for pain relief, patient denied. Prescribed Robaxin, Mobic, and increased the Gabapentin slowly to 300mg  tid if tolerated. Patient states, that she can not afford the medication until next Wednesday. Therefore patient received a Toradol injection,after signing a consent, to help with her pain until she is able to get  her meds.  If patient does not get sufficient relief, might consider referral to neurosurgeon.

## 2011-11-02 ENCOUNTER — Other Ambulatory Visit: Payer: Self-pay | Admitting: Cardiology

## 2011-11-08 ENCOUNTER — Ambulatory Visit: Payer: Medicare Other | Admitting: Internal Medicine

## 2011-12-05 ENCOUNTER — Encounter (HOSPITAL_COMMUNITY): Payer: Self-pay | Admitting: Emergency Medicine

## 2011-12-05 ENCOUNTER — Emergency Department (HOSPITAL_COMMUNITY): Payer: Medicare Other

## 2011-12-05 ENCOUNTER — Telehealth: Payer: Self-pay | Admitting: Cardiology

## 2011-12-05 ENCOUNTER — Inpatient Hospital Stay (HOSPITAL_COMMUNITY)
Admission: EM | Admit: 2011-12-05 | Discharge: 2011-12-11 | DRG: 292 | Disposition: A | Discharge status: Home / Self Care | Payer: Medicare Other | Attending: Emergency Medicine | Admitting: Emergency Medicine

## 2011-12-05 DIAGNOSIS — IMO0002 Reserved for concepts with insufficient information to code with codable children: Secondary | ICD-10-CM | POA: Diagnosis present

## 2011-12-05 DIAGNOSIS — Z9861 Coronary angioplasty status: Secondary | ICD-10-CM

## 2011-12-05 DIAGNOSIS — E1129 Type 2 diabetes mellitus with other diabetic kidney complication: Secondary | ICD-10-CM | POA: Diagnosis present

## 2011-12-05 DIAGNOSIS — I4729 Other ventricular tachycardia: Secondary | ICD-10-CM | POA: Diagnosis not present

## 2011-12-05 DIAGNOSIS — F17200 Nicotine dependence, unspecified, uncomplicated: Secondary | ICD-10-CM | POA: Diagnosis present

## 2011-12-05 DIAGNOSIS — J449 Chronic obstructive pulmonary disease, unspecified: Secondary | ICD-10-CM | POA: Diagnosis present

## 2011-12-05 DIAGNOSIS — IMO0001 Reserved for inherently not codable concepts without codable children: Secondary | ICD-10-CM | POA: Diagnosis present

## 2011-12-05 DIAGNOSIS — I509 Heart failure, unspecified: Secondary | ICD-10-CM

## 2011-12-05 DIAGNOSIS — Z79899 Other long term (current) drug therapy: Secondary | ICD-10-CM

## 2011-12-05 DIAGNOSIS — K219 Gastro-esophageal reflux disease without esophagitis: Secondary | ICD-10-CM | POA: Diagnosis present

## 2011-12-05 DIAGNOSIS — I472 Ventricular tachycardia, unspecified: Secondary | ICD-10-CM | POA: Diagnosis not present

## 2011-12-05 DIAGNOSIS — Z794 Long term (current) use of insulin: Secondary | ICD-10-CM

## 2011-12-05 DIAGNOSIS — K5900 Constipation, unspecified: Secondary | ICD-10-CM | POA: Diagnosis present

## 2011-12-05 DIAGNOSIS — J4489 Other specified chronic obstructive pulmonary disease: Secondary | ICD-10-CM | POA: Diagnosis present

## 2011-12-05 DIAGNOSIS — R0609 Other forms of dyspnea: Secondary | ICD-10-CM

## 2011-12-05 DIAGNOSIS — G4733 Obstructive sleep apnea (adult) (pediatric): Secondary | ICD-10-CM

## 2011-12-05 DIAGNOSIS — I2510 Atherosclerotic heart disease of native coronary artery without angina pectoris: Secondary | ICD-10-CM | POA: Diagnosis present

## 2011-12-05 DIAGNOSIS — D509 Iron deficiency anemia, unspecified: Secondary | ICD-10-CM

## 2011-12-05 DIAGNOSIS — I10 Essential (primary) hypertension: Secondary | ICD-10-CM

## 2011-12-05 DIAGNOSIS — E871 Hypo-osmolality and hyponatremia: Secondary | ICD-10-CM | POA: Diagnosis present

## 2011-12-05 DIAGNOSIS — I5043 Acute on chronic combined systolic (congestive) and diastolic (congestive) heart failure: Principal | ICD-10-CM

## 2011-12-05 DIAGNOSIS — I5022 Chronic systolic (congestive) heart failure: Secondary | ICD-10-CM | POA: Diagnosis present

## 2011-12-05 DIAGNOSIS — E785 Hyperlipidemia, unspecified: Secondary | ICD-10-CM

## 2011-12-05 DIAGNOSIS — E669 Obesity, unspecified: Secondary | ICD-10-CM | POA: Diagnosis present

## 2011-12-05 DIAGNOSIS — Z7982 Long term (current) use of aspirin: Secondary | ICD-10-CM

## 2011-12-05 HISTORY — DX: Iron deficiency anemia, unspecified: D50.9

## 2011-12-05 HISTORY — DX: Hypo-osmolality and hyponatremia: E87.1

## 2011-12-05 HISTORY — DX: Tobacco use: Z72.0

## 2011-12-05 LAB — BASIC METABOLIC PANEL
BUN: 12 mg/dL (ref 6–23)
Calcium: 9 mg/dL (ref 8.4–10.5)
Creatinine, Ser: 0.99 mg/dL (ref 0.50–1.10)
GFR calc Af Amer: 67 mL/min — ABNORMAL LOW (ref >90)

## 2011-12-05 LAB — CBC WITH DIFFERENTIAL/PLATELET
Basophils Absolute: 0.1 10*3/uL (ref 0.0–0.1)
Eosinophils Absolute: 0.1 10*3/uL (ref 0.0–0.7)
Lymphs Abs: 1.5 10*3/uL (ref 0.7–4.0)
MCH: 27.8 pg (ref 26.0–34.0)
MCHC: 33.9 g/dL (ref 30.0–36.0)
MCV: 82 fL (ref 78.0–100.0)
Monocytes Absolute: 0.5 10*3/uL (ref 0.1–1.0)
Monocytes Relative: 9 % (ref 3–12)
Platelets: ADEQUATE 10*3/uL (ref 150–400)
RDW: 18 % — ABNORMAL HIGH (ref 11.5–15.5)
WBC: 5.2 10*3/uL (ref 4.0–10.5)

## 2011-12-05 LAB — URINALYSIS, ROUTINE W REFLEX MICROSCOPIC
Bilirubin Urine: NEGATIVE
Ketones, ur: NEGATIVE mg/dL
Nitrite: NEGATIVE
Protein, ur: 30 mg/dL — AB
pH: 6.5 (ref 5.0–8.0)

## 2011-12-05 LAB — CREATININE, SERUM
Creatinine, Ser: 0.96 mg/dL (ref 0.50–1.10)
GFR calc Af Amer: 69 mL/min — ABNORMAL LOW (ref >90)
GFR calc non Af Amer: 60 mL/min — ABNORMAL LOW (ref >90)

## 2011-12-05 LAB — CBC
Hemoglobin: 9.3 g/dL — ABNORMAL LOW (ref 12.0–15.0)
Platelets: 326 10*3/uL (ref 150–400)
RBC: 3.29 MIL/uL — ABNORMAL LOW (ref 3.87–5.11)
WBC: 4.5 10*3/uL (ref 4.0–10.5)

## 2011-12-05 LAB — GLUCOSE, CAPILLARY
Glucose-Capillary: 168 mg/dL — ABNORMAL HIGH (ref 70–99)
Glucose-Capillary: 157 mg/dL — ABNORMAL HIGH (ref 70–99)

## 2011-12-05 LAB — TROPONIN I: Troponin I: <0.3 ng/mL (ref <0.30)

## 2011-12-05 LAB — MRSA PCR SCREENING: MRSA by PCR: NEGATIVE

## 2011-12-05 LAB — URINE MICROSCOPIC-ADD ON

## 2011-12-05 LAB — HEMOGLOBIN A1C: Hgb A1c MFr Bld: 7.4 % — ABNORMAL HIGH (ref <5.7)

## 2011-12-05 LAB — PRO B NATRIURETIC PEPTIDE: Pro B Natriuretic peptide (BNP): 28602 pg/mL — ABNORMAL HIGH (ref 0–125)

## 2011-12-05 LAB — PROTIME-INR
INR: 1.34 (ref 0.00–1.49)
Prothrombin Time: 16.3 seconds — ABNORMAL HIGH (ref 11.6–15.2)

## 2011-12-05 MED ORDER — SODIUM CHLORIDE 0.9 % IJ SOLN
3.0000 mL | Freq: Two times a day (BID) | INTRAMUSCULAR | Status: DC
  Administered 2011-12-05 – 2011-12-11 (×11): 3 mL via INTRAVENOUS

## 2011-12-05 MED ORDER — GABAPENTIN (ONCE-DAILY) 300 MG PO TABS
1.0000 | ORAL_TABLET | Freq: Three times a day (TID) | ORAL | Status: DC
  Filled 2011-12-05 (×2): qty 1

## 2011-12-05 MED ORDER — ONDANSETRON HCL 4 MG/2ML IJ SOLN: 4.0000 mg | Freq: Four times a day (QID) | INTRAMUSCULAR | Status: DC | PRN

## 2011-12-05 MED ORDER — ZOLPIDEM TARTRATE 5 MG PO TABS: 5.0000 mg | ORAL_TABLET | Freq: Every evening | ORAL | Status: DC | PRN

## 2011-12-05 MED ORDER — ASPIRIN 81 MG PO TABS
81.0000 mg | ORAL_TABLET | Freq: Every day | ORAL | Status: DC
  Filled 2011-12-05: qty 1

## 2011-12-05 MED ORDER — INSULIN ASPART 100 UNIT/ML ~~LOC~~ SOLN
5.0000 [IU] | Freq: Three times a day (TID) | SUBCUTANEOUS | Status: DC
  Administered 2011-12-08 – 2011-12-09 (×2): 5 [IU] via SUBCUTANEOUS

## 2011-12-05 MED ORDER — VENLAFAXINE HCL ER 150 MG PO CP24
150.0000 mg | ORAL_CAPSULE | Freq: Two times a day (BID) | ORAL | Status: DC
  Administered 2011-12-05 – 2011-12-11 (×12): 150 mg via ORAL
  Filled 2011-12-05 (×13): qty 1

## 2011-12-05 MED ORDER — GABAPENTIN 300 MG PO CAPS
300.0000 mg | ORAL_CAPSULE | Freq: Three times a day (TID) | ORAL | Status: DC
  Administered 2011-12-05 – 2011-12-11 (×17): 300 mg via ORAL
  Filled 2011-12-05 (×19): qty 1

## 2011-12-05 MED ORDER — CARVEDILOL 25 MG PO TABS: 37.5000 mg | ORAL_TABLET | Freq: Two times a day (BID) | ORAL | Status: DC

## 2011-12-05 MED ORDER — TRAZODONE HCL 150 MG PO TABS
150.0000 mg | ORAL_TABLET | Freq: Every day | ORAL | Status: DC
  Administered 2011-12-05 – 2011-12-10 (×6): 150 mg via ORAL
  Filled 2011-12-05 (×8): qty 1

## 2011-12-05 MED ORDER — ALPRAZOLAM 0.25 MG PO TABS: 0.2500 mg | ORAL_TABLET | Freq: Two times a day (BID) | ORAL | Status: DC | PRN

## 2011-12-05 MED ORDER — SITAGLIPTIN PHOS-METFORMIN HCL 50-500 MG PO TABS: 1.0000 | ORAL_TABLET | Freq: Two times a day (BID) | ORAL | Status: DC

## 2011-12-05 MED ORDER — FUROSEMIDE 10 MG/ML IJ SOLN
40.0000 mg | Freq: Once | INTRAMUSCULAR | Status: AC
  Administered 2011-12-05: 40 mg via INTRAVENOUS
  Filled 2011-12-05: qty 4

## 2011-12-05 MED ORDER — NITROGLYCERIN 0.4 MG SL SUBL
0.4000 mg | SUBLINGUAL_TABLET | SUBLINGUAL | Status: DC | PRN
  Administered 2011-12-05: 0.4 mg via SUBLINGUAL
  Filled 2011-12-05 (×2): qty 25

## 2011-12-05 MED ORDER — MONTELUKAST SODIUM 10 MG PO TABS
10.0000 mg | ORAL_TABLET | Freq: Every day | ORAL | Status: DC
  Administered 2011-12-05 – 2011-12-10 (×5): 10 mg via ORAL
  Filled 2011-12-05 (×7): qty 1

## 2011-12-05 MED ORDER — METHOCARBAMOL 500 MG PO TABS
500.0000 mg | ORAL_TABLET | Freq: Three times a day (TID) | ORAL | Status: DC
  Administered 2011-12-05 – 2011-12-11 (×18): 500 mg via ORAL
  Filled 2011-12-05 (×22): qty 1

## 2011-12-05 MED ORDER — MOMETASONE FURO-FORMOTEROL FUM 100-5 MCG/ACT IN AERO
2.0000 | INHALATION_SPRAY | Freq: Two times a day (BID) | RESPIRATORY_TRACT | Status: DC
  Administered 2011-12-05 – 2011-12-10 (×11): 2 via RESPIRATORY_TRACT
  Filled 2011-12-05: qty 8.8

## 2011-12-05 MED ORDER — ENOXAPARIN SODIUM 40 MG/0.4ML ~~LOC~~ SOLN
40.0000 mg | SUBCUTANEOUS | Status: DC
  Administered 2011-12-05 – 2011-12-10 (×6): 40 mg via SUBCUTANEOUS
  Filled 2011-12-05 (×7): qty 0.4

## 2011-12-05 MED ORDER — PANTOPRAZOLE SODIUM 40 MG PO TBEC
40.0000 mg | DELAYED_RELEASE_TABLET | Freq: Two times a day (BID) | ORAL | Status: DC
  Administered 2011-12-05 – 2011-12-11 (×13): 40 mg via ORAL
  Filled 2011-12-05 (×13): qty 1

## 2011-12-05 MED ORDER — ACETAMINOPHEN 325 MG PO TABS
1300.0000 mg | ORAL_TABLET | Freq: Three times a day (TID) | ORAL | Status: DC | PRN
  Administered 2011-12-05 – 2011-12-08 (×2): 1300 mg via ORAL
  Administered 2011-12-09 – 2011-12-10 (×3): 650 mg via ORAL
  Filled 2011-12-05: qty 4
  Filled 2011-12-05 (×3): qty 2
  Filled 2011-12-05: qty 4

## 2011-12-05 MED ORDER — INSULIN ASPART 100 UNIT/ML ~~LOC~~ SOLN: 10.0000 [IU] | Freq: Three times a day (TID) | SUBCUTANEOUS | Status: DC

## 2011-12-05 MED ORDER — METFORMIN HCL 500 MG PO TABS
500.0000 mg | ORAL_TABLET | Freq: Two times a day (BID) | ORAL | Status: DC
  Administered 2011-12-05 – 2011-12-11 (×11): 500 mg via ORAL
  Filled 2011-12-05 (×14): qty 1

## 2011-12-05 MED ORDER — CARVEDILOL 25 MG PO TABS
37.5000 mg | ORAL_TABLET | Freq: Two times a day (BID) | ORAL | Status: DC
  Administered 2011-12-06 – 2011-12-11 (×10): 37.5 mg via ORAL
  Filled 2011-12-05 (×13): qty 1

## 2011-12-05 MED ORDER — FUROSEMIDE 10 MG/ML IJ SOLN
80.0000 mg | Freq: Two times a day (BID) | INTRAMUSCULAR | Status: DC
  Administered 2011-12-05 – 2011-12-11 (×12): 80 mg via INTRAVENOUS
  Filled 2011-12-05 (×14): qty 8

## 2011-12-05 MED ORDER — BISACODYL 5 MG PO TBEC
10.0000 mg | DELAYED_RELEASE_TABLET | Freq: Every evening | ORAL | Status: DC | PRN
  Administered 2011-12-06: 10 mg via ORAL
  Filled 2011-12-05 (×2): qty 2

## 2011-12-05 MED ORDER — LINAGLIPTIN 5 MG PO TABS
5.0000 mg | ORAL_TABLET | Freq: Every day | ORAL | Status: DC
  Administered 2011-12-05 – 2011-12-11 (×7): 5 mg via ORAL
  Filled 2011-12-05 (×7): qty 1

## 2011-12-05 MED ORDER — POLYETHYLENE GLYCOL 3350 17 G PO PACK
17.0000 g | PACK | Freq: Every day | ORAL | Status: DC | PRN
  Administered 2011-12-07: 17 g via ORAL
  Filled 2011-12-05 (×3): qty 1

## 2011-12-05 MED ORDER — POTASSIUM CHLORIDE CRYS ER 20 MEQ PO TBCR
20.0000 meq | EXTENDED_RELEASE_TABLET | Freq: Two times a day (BID) | ORAL | Status: DC
  Administered 2011-12-05 – 2011-12-10 (×12): 20 meq via ORAL
  Administered 2011-12-11: 10:00:00 via ORAL
  Filled 2011-12-05 (×16): qty 1

## 2011-12-05 MED ORDER — NITROGLYCERIN 0.4 MG SL SUBL
0.4000 mg | SUBLINGUAL_TABLET | SUBLINGUAL | Status: DC | PRN
  Administered 2011-12-06 – 2011-12-09 (×3): 0.4 mg via SUBLINGUAL
  Filled 2011-12-05 (×2): qty 25

## 2011-12-05 MED ORDER — ATORVASTATIN CALCIUM 40 MG PO TABS
40.0000 mg | ORAL_TABLET | Freq: Every day | ORAL | Status: DC
  Administered 2011-12-05 – 2011-12-11 (×7): 40 mg via ORAL
  Filled 2011-12-05 (×7): qty 1

## 2011-12-05 MED ORDER — INSULIN ASPART 100 UNIT/ML ~~LOC~~ SOLN
0.0000 [IU] | Freq: Three times a day (TID) | SUBCUTANEOUS | Status: DC
  Administered 2011-12-08: 1 [IU] via SUBCUTANEOUS
  Administered 2011-12-09: 2 [IU] via SUBCUTANEOUS
  Administered 2011-12-10 – 2011-12-11 (×2): 1 [IU] via SUBCUTANEOUS

## 2011-12-05 MED ORDER — ACETAMINOPHEN ER 650 MG PO TBCR: 1300.0000 mg | EXTENDED_RELEASE_TABLET | Freq: Three times a day (TID) | ORAL | Status: DC | PRN

## 2011-12-05 MED ORDER — IRBESARTAN 300 MG PO TABS
300.0000 mg | ORAL_TABLET | Freq: Every day | ORAL | Status: DC
  Administered 2011-12-05 – 2011-12-11 (×7): 300 mg via ORAL
  Filled 2011-12-05 (×7): qty 1

## 2011-12-05 MED ORDER — GABAPENTIN 600 MG PO TABS
300.0000 mg | ORAL_TABLET | Freq: Three times a day (TID) | ORAL | Status: DC
  Filled 2011-12-05 (×2): qty 0.5

## 2011-12-05 MED ORDER — SODIUM CHLORIDE 0.9 % IJ SOLN: 3.0000 mL | INTRAMUSCULAR | Status: DC | PRN

## 2011-12-05 MED ORDER — INSULIN GLARGINE 100 UNIT/ML ~~LOC~~ SOLN
10.0000 [IU] | Freq: Every day | SUBCUTANEOUS | Status: DC
  Administered 2011-12-05 – 2011-12-06 (×2): 10 [IU] via SUBCUTANEOUS

## 2011-12-05 MED ORDER — ASPIRIN EC 81 MG PO TBEC
81.0000 mg | DELAYED_RELEASE_TABLET | Freq: Every day | ORAL | Status: DC
  Administered 2011-12-05 – 2011-12-11 (×7): 81 mg via ORAL
  Filled 2011-12-05 (×7): qty 1

## 2011-12-05 MED ORDER — SODIUM CHLORIDE 0.9 % IV SOLN: 250.0000 mL | INTRAVENOUS | Status: DC | PRN

## 2011-12-05 NOTE — Progress Notes (Signed)
Pt transferred via stretcher from ED.  VSS on arrival.  Oriented to unit and verbalized understanding.  Report given to night shift RN Marchelle Folks.

## 2011-12-05 NOTE — ED Notes (Signed)
Patient transported to X-ray 

## 2011-12-05 NOTE — Telephone Encounter (Signed)
Pt extremely SOB  She reports recently being in the hospital over the holidays in [place].  She checked herself out AMA because she wanted to come home.  SOB has increased.  Advised pt to report to the ED at [facility]  She states she will go now.  Trish aware.

## 2011-12-05 NOTE — ED Notes (Signed)
Carb Mod Diet ordered spoke with [place]

## 2011-12-05 NOTE — Consult Note (Signed)
CARDIOLOGY CONSULT NOTE   Patient ID: KAIYLA STAHLY MRN: [id number] DOB/AGE: [birth year] 66 y.o.  Admit date: 12/05/2011  Primary Physician   [name], MD Primary Cardiologist  [facility] Reason for Consultation   CHF  [id number] P Castelli is a 66 y.o. female with a history of CAD and CHF. She went to visit family over the Thanksgiving holiday and was hospitalized in [facility] ([address], [place], [place] [address]; [phone]). She did not like the treatment she was receiving there and left AMA. She was still SOB and came to the ER today.   Her SOB began after she arrived in [place]. She admits to missing 1 dose of Lasix (yesterday) and admits to significant dietary indiscretion. She became more and more SOB with exertion and finally went to the hospital. She describes PND and orthopnea. She does not know how much weight she gained but had significant LE edema. She had some chest pain also, but no more than usual. She came home last pm and had a miserable night due to SOB. She is less swollen than on admission in [place] but still with extra fluid. She came into the ER and feels a little better after IV Lasix. While in the hospital, LE dopplers were negative by her report.   Past Medical History  Diagnosis Date  . DM   . DYSLIPIDEMIA   . OBESITY NOS - BMI approx 27   . DEPRESSION, RECURRENT, IN PARTIAL REMISSION   . HYPERTENSION   . 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 CONGESTIVE HEART FAILURE     EF 40-45% echo 05/2011  . GERD   . DIVERTICULOSIS, COLON, WITH HEMORRHAGE   . BACK PAIN, LUMBAR, WITH RADICULOPATHY   . OBSTRUCTIVE SLEEP APNEA    Anemia, Iron deficiency   . Asthma      Past Surgical History  Procedure Date  . Uvuloplasty   . Total abdominal hysterectomy w/ bilateral salpingoophorectomy   . Abdominal hysterectomy    . Cholecystectomy   . Coronary angioplasty with stent placement 2002    Allergies  Allergen Reactions  . Amlodipine Other (See Comments)    constipation  . Ramipril Cough  . Adhesive (Tape) Hives    "peels skin off"  . Penicillins Itching  . Codeine Rash   I have reviewed the patient's current medications    . aspirin  81 mg Oral Daily  . atorvastatin  40 mg Oral Daily  . [COMPLETED] furosemide  40 mg Intravenous Once  . furosemide  80 mg Intravenous BID  . Gabapentin (PHN)  1 tablet Oral TID  . insulin aspart  10-15 Units Subcutaneous TID AC  . insulin glargine  10 Units Subcutaneous QHS  . irbesartan  300 mg Oral Daily  . methocarbamol  500 mg Oral TID  . mometasone-formoterol  2 puff Inhalation BID  . montelukast  10 mg Oral QHS  . pantoprazole  40 mg Oral BID  . potassium chloride  20 mEq Oral BID  . sitaGLIPtan-metformin  1 tablet Oral BID WC  . traZODone  150 mg Oral QHS  . venlafaxine XR  150 mg Oral BID     acetaminophen, bisacodyl, nitroGLYCERIN, polyethylene glycol  Medication Sig  acetaminophen (TYLENOL ARTHRITIS PAIN) 650 MG CR tablet Take 1,300 mg by mouth every 8 (eight)  hours as needed. For pain  albuterol (PROVENTIL HFA;VENTOLIN HFA) 108 (90 BASE) MCG/ACT inhaler Inhale 2 puffs into the lungs every 6 (six) hours as needed. For shortness of breath  aspirin 81 MG tablet Take 81 mg by mouth daily.    atorvastatin (LIPITOR) 40 MG tablet Take 1 tablet (40 mg total) by mouth daily.  bisacodyl (DULCOLAX) 5 MG EC tablet Take 10 mg by mouth at bedtime as needed. For constipation  CALCIUM-VITAMIN D PO Take 1 tablet by mouth daily.  carvedilol (COREG) 25 MG tablet Take 37.5 mg by mouth 2 (two) times daily with a meal. Take one and one half tablet twice daily. PT WANTS BRAND ONLY  furosemide (LASIX) 80 MG tablet Take 80 mg by mouth daily.  Gabapentin, PHN, 300 MG TABS Take 1 tablet by mouth 3 (three) times daily.  insulin aspart (NOVOLOG) 100 UNIT/ML injection  Inject 10-15 Units into the skin 3 (three) times daily before meals. 15 units at breakfast, 10units at lunch and dinner  insulin glargine (LANTUS) 100 UNIT/ML injection Inject 10-20 Units into the skin at bedtime. Per sliding scale  meloxicam (MOBIC) 7.5 MG tablet Take 7.5 mg by mouth 2 (two) times daily.  methocarbamol (ROBAXIN) 500 MG tablet Take 500 mg by mouth 3 (three) times daily.  mometasone-formoterol (DULERA) 100-5 MCG/ACT AERO Inhale 2 puffs into the lungs 2 (two) times daily.  montelukast (SINGULAIR) 10 MG tablet Take 10 mg by mouth at bedtime.  olmesartan (BENICAR) 40 MG tablet Take 40 mg by mouth daily.  pantoprazole (PROTONIX) 40 MG tablet Take 40 mg by mouth 2 (two) times daily.  Polyethylene Glycol 3350 (MIRALAX PO) Take 17 g by mouth daily. As directed  potassium chloride 10 MEQ CR capsule Take 10 mEq by mouth daily.    sitaGLIPtan-metformin (JANUMET) 50-500 MG per tablet Take 1 tablet by mouth 2 (two) times daily with a meal.  traZODone (DESYREL) 100 MG tablet Take 150 mg by mouth at bedtime. 1 and 1/2 pills at hs  venlafaxine (EFFEXOR XR) 150 MG 24 hr capsule Take 150 mg by mouth 2 (two) times daily.       History   Social History  . Marital Status: Married    Spouse Name: N/A    Number of Children: 0  . Years of Education: N/A   Occupational History  . Retired Insurance account manager for [employer]    Social History Main Topics  . Smoking status: Current Some Day Smoker -- 1.0 packs/day for 20 years    Types: Cigarettes  . Smokeless tobacco: Never Used     Comment: Still smokes a little.  . Alcohol Use: No  . Drug Use: No  . Sexually Active: Not Currently    Birth Control/ Protection: Surgical   Other Topics Concern  . Not on file   Social History Narrative  . No narrative on file    Family Status  Relation Status Death Age  . Mother Deceased 41    No CAD  . Father Deceased 51    No CAD    Family History  Problem Relation Age of Onset  .  Stroke Mother   . Breast cancer Sister   . Emphysema Sister   . Cancer Sister     breast  . Alcohol abuse Brother   . Colon cancer      uncles x 2  . Prostate cancer Brother      ROS: Cough productive of yellowish sputum but no fevers or chills.  No melena. Chronic MS aches/pains. Was told to limit fluid to 1000 cc/day but drinks 2-24 oz glasses water daily. Full 14 point review of systems complete and found to be negative unless listed above.  Physical Exam: Blood pressure 187/102, pulse 86, temperature 98.8 F (37.1 C), temperature source Rectal, resp. rate 16, SpO2 100.00%.  General: Well developed, well nourished, female in acute respiratory distress Head: Eyes PERRLA, No xanthomas.   Normocephalic and atraumatic, oropharynx without edema or exudate. Dentition: poor Lungs: bilateral basilar crackles Heart: HRRR S1 S2, no rub/gallop,   murmur. pulses are 2+ both extrem, decreased in lower extrem due to edema.   Neck: No carotid bruits. No lymphadenopathy.  JVD at 12 cm. Abdomen: Bowel sounds present, abdomen soft and non-tender without masses or hernias noted. Msk:  No spine or cva tenderness. No weakness, no joint deformities or effusions. Extremities: No clubbing or cyanosis. 2+ edema.  Neuro: Alert and oriented X 3. No focal deficits noted. Psych:  Good affect, responds appropriately Skin: No rashes or lesions noted.  Labs:   Lab Results  Component Value Date   WBC 5.2 12/05/2011   HGB 9.9* 12/05/2011   HCT 29.2* 12/05/2011   MCV 82.0 12/05/2011   PLT PLATELET CLUMPS NOTED ON SMEAR, COUNT APPEARS ADEQUATE 12/05/2011   No results found for this basename: INR in the last 72 hours   Lab 12/05/11 1146  NA 125*  K 4.9  CL 87*  CO2 26  BUN 12  CREATININE 0.99  CALCIUM 9.0  PROT --  BILITOT --  ALKPHOS --  ALT --  AST --  GLUCOSE 188*    Basename 12/05/11 1207  TROPIPOC 0.05   Pro B Natriuretic peptide (BNP)  Date/Time Value Range Status  12/05/2011 11:47 AM  28602.0* 0 - 125 pg/mL Final  09/07/2011 10:08 AM 752.0* 0.0 - 100.0 pg/mL Final   Echo: 05/03/2011 Study Conclusions - Left ventricle: The cavity size was normal. Wall thickness was increased in a pattern of mild LVH. Indeterminant diastolic function. Systolic function was mildly to moderately reduced. The estimated ejection fraction was in the range of 40% to 45%. Moderate to severe inferior and posterior hypokinesis. - Aortic valve: There was no stenosis. - Mitral valve: No significant regurgitation. - Left atrium: The atrium was mildly dilated. - Right ventricle: The cavity size was normal. Systolic function was normal. - Right atrium: The atrium was mildly dilated. - Tricuspid valve: No complete TR doppler jet so unable to estimate PA systolic pressure. - Inferior vena cava: The vessel was normal in size; the respirophasic diameter changes were in the normal range (= 50%); findings are consistent with normal central venous pressure. Impressions: - Normal LV size with mild LV hypertrophy. EF 40-45% with moderate to severe inferior and posterior hypokinesis. Normal RV size and systolic function. No significant valvular abnormalities.  ECG: 05-Dec-2011 10:37:49  Sinus rhythm with 1st degree A-V block Possible Inferior infarct , age undetermined Cannot rule out Anterior infarct , age undetermined Abnormal ECG Vent. rate 83 BPM PR interval 270 ms QRS duration 90 ms QT/QTc 392/460 ms P-R-T axes 62 80 53  Radiology:  Dg Chest 2 View 12/05/2011  *RADIOLOGY REPORT*  Clinical Data: Shortness of breath, cough.  CHEST - 2 VIEW  Comparison: 05/01/2011  Findings: Cardiomegaly with vascular congestion.  Diffuse interstitial prominence is stable, likely mild chronic interstitial lung disease.  Stable elevation of the right hemidiaphragm. Blunting of the right costophrenic angle compatible with small effusion.  No confluent  airspace opacities.  IMPRESSION: Cardiomegaly, vascular congestion.   Stable chronic interstitial prominence.  Suspect small right pleural effusion.   Original Report Authenticated By: Charlett Nose, M.D.     ASSESSMENT AND PLAN:   The patient was seen today by Dr Jens Som, the patient evaluated and the data reviewed.  Principal Problem:  *Acute on chronic systolic CHF (congestive heart failure), NYHA class 4 - admit, IV Lasix, foley, follow labs, get records from [place], then decide on further eval.  Otherwise, continue home meds and fluid restriction of 1000 cc. Guaiac stools. Active Problems:  Type II or unspecified type diabetes mellitus without mention of complication, uncontrolled  DYSLIPIDEMIA  HYPERTENSION  Iron deficiency anemia  Constipation  Hyponatremia   Signed: Theodore Demark 12/05/2011, 2:45 PM Co-Sign MD As above, patient seen and examined. Briefly patient is a 66 year old female with past medical history of coronary artery disease and congestive heart failure admitted with acute on chronic combined systolic and diastolic congestive heart failure. The patient recently visited [place]. While there she was noncompliant with her diet. She ate ham and potato chips. She developed progressive dyspnea on exertion, orthopnea, PND and pedal edema. She had occasional mild chest pain which has been a chronic issue. She was admitted to a hospital there and underwent diuresis. However she preferred to return to [facility] and left AGAINST MEDICAL ADVICE yesterday. Her symptoms were slowly improving but not at baseline. She continues to have dyspnea on exertion and pedal edema. Exam is significant for jugular venous distention, mildly diminished breath sounds at the bases and 2+ lower extremity edema. Chest x-ray shows vascular congestion and possible small right effusion. Sodium is 125 with normal renal function. ProBNP is 28,602. Hemoglobin is 9.9 with an MCV of 82. EKG shows sinus rhythm, first degree AV block, prior inferior infarct and prior anterior  infarct cannot be excluded. Plan admit and diurese with Lasix 80 mg IV twice a day. Fluid restrict. Follow sodium, potassium and renal function. Obtain records from outside hospital concerning venous Dopplers which patient states were negative and echocardiogram report. Patient's blood pressure is significantly elevated. She has not taken her medications today. Continue preadmission dose of ARB and beta blocker. Adjust medications based on followup readings. Discontinue tobacco abuse. Olga Millers 3:08 PM

## 2011-12-05 NOTE — Telephone Encounter (Signed)
New Problem:    Patient was discharged from the hospital and is complaining of SOB.  Patient feels like she needs oxygen.  Intermittent difficulty of breathing is audible over the phone.  Please call back.

## 2011-12-05 NOTE — ED Notes (Signed)
Attempted blood draws x2 in triage. Will call for call to attempt after patient return from xray.

## 2011-12-05 NOTE — ED Notes (Signed)
Pt sts increased swelling and SOB with productive cough with yellow sputum x 1 week; pt sts in hospital in [place] this week; pt sts hx of CHF

## 2011-12-05 NOTE — ED Provider Notes (Signed)
I saw and evaluated the patient, reviewed the resident's note and I agree with the findings and plan.  Armeda Plumb, MD 12/05/11 1631 

## 2011-12-05 NOTE — ED Provider Notes (Signed)
History     CSN: [id number]  Arrival date & time 12/05/11  1024   First MD Initiated Contact with Patient 12/05/11 1155      Chief Complaint  Patient presents with  . Shortness of Breath  . Cough    (Consider location/radiation/quality/duration/timing/severity/associated sxs/prior treatment) Patient is a 66 y.o. female presenting with shortness of breath and cough. The history is provided by the patient.  Shortness of Breath  The current episode started more than 1 week ago. The onset was gradual. The problem occurs continuously. The problem has been gradually worsening. The problem is moderate. Nothing relieves the symptoms. Nothing aggravates the symptoms. Associated symptoms include chest pain (L sided, underneath left breast, nonradiating), cough and shortness of breath. Pertinent negatives include no fever and no wheezing. She has had prior hospitalizations (Recently hospitalized in [place], left 2 days ago). Her past medical history does not include asthma or eczema.  Cough Associated symptoms include chest pain (L sided, underneath left breast, nonradiating) and shortness of breath. Pertinent negatives include no chills and no wheezing. Her past medical history does not include asthma.    Past Medical History  Diagnosis Date  . DM   . DYSLIPIDEMIA   . OBESITY NOS   . DEPRESSION, RECURRENT, IN PARTIAL REMISSION   . HYPERTENSION   . 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 CONGESTIVE HEART FAILURE     EF 35% echo 04/2011  . GERD   . DIVERTICULOSIS, COLON, WITH HEMORRHAGE   . BACK PAIN, LUMBAR, WITH RADICULOPATHY   . OBSTRUCTIVE SLEEP APNEA   . Asthma     Past Surgical History  Procedure Date  . Uvuloplasty   . Total abdominal hysterectomy w/ bilateral salpingoophorectomy   . Abdominal hysterectomy   . Cholecystectomy   .  Coronary angioplasty with stent placement 2002    Family History  Problem Relation Age of Onset  . Stroke Mother   . Breast cancer Sister   . Emphysema Sister   . Cancer Sister     breast  . Alcohol abuse Brother   . Colon cancer      uncles x 2  . Prostate cancer Brother     History  Substance Use Topics  . Smoking status: Current Some Day Smoker -- 1.0 packs/day for 20 years    Types: Cigarettes    Last Attempt to Quit: 01/03/2007  . Smokeless tobacco: Never Used  . Alcohol Use: No    OB History    Grav Para Term Preterm Abortions TAB SAB Ect Mult Living                  Review of Systems  Constitutional: Negative for fever and chills.  Respiratory: Positive for cough and shortness of breath. Negative for wheezing.   Cardiovascular: Positive for chest pain (L sided, underneath left breast, nonradiating) and leg swelling (improving).  All other systems reviewed and are negative.    Allergies  Amlodipine; Ramipril; Adhesive; Penicillins; and Codeine  Home Medications   Current Outpatient Rx  Name  Route  Sig  Dispense  Refill  . ACETAMINOPHEN ER 650 MG PO TBCR   Oral   Take 1,300 mg by mouth every 8 (eight) hours as needed. For pain         . ALBUTEROL SULFATE HFA 108 (  90 BASE) MCG/ACT IN AERS   Inhalation   Inhale 2 puffs into the lungs every 6 (six) hours as needed.   1 Inhaler   5   . ASPIRIN 81 MG PO TABS   Oral   Take 81 mg by mouth daily.           . ATORVASTATIN CALCIUM 40 MG PO TABS   Oral   Take 1 tablet (40 mg total) by mouth daily.   90 tablet   3   . BISACODYL 5 MG PO TBEC   Oral   Take 10 mg by mouth at bedtime as needed. For constipation         . CARVEDILOL 25 MG PO TABS   Oral   Take 1.5 tablets (37.5 mg total) by mouth 2 (two) times daily with a meal. Take one and one half tablet twice daily. PT WANTS BRAND ONLY   90 tablet   10     Name brand only   . FUROSEMIDE 80 MG PO TABS   Oral   Take 1 tablet (80 mg total)  by mouth daily.   30 tablet   11   . GABAPENTIN 100 MG PO CAPS   Oral   Take 1 capsule (100 mg total) by mouth 3 (three) times daily. Start one capsule at bedtime for 3 days, then  Twice a day for 3 days, then  3 times a day   90 capsule   1   . GABAPENTIN (PHN) 300 MG PO TABS   Oral   Take 1 tablet by mouth 3 (three) times daily.   90 tablet   2   . INSULIN ASPART 100 UNIT/ML [place] SOLN   Subcutaneous   Inject 10-15 Units into the skin 3 (three) times daily before meals. Sliding scale.    15 units at breakfast, 10units at lunch,10 units at dinner         . INSULIN GLARGINE 100 UNIT/ML [place] SOLN   Subcutaneous   Inject 10-20 Units into the skin at bedtime. Per sliding scale         . FERRAPLUS 90 90-1 MG PO TABS   Oral   Take 1 tablet by mouth daily.   90 tablet   3   . ISOSORBIDE MONONITRATE ER 30 MG PO TB24   Oral   Take 1 tablet (30 mg total) by mouth daily.   90 tablet   3   . MELOXICAM 7.5 MG PO TABS   Oral   Take 1 tablet (7.5 mg total) by mouth 2 (two) times daily.   60 tablet   2   . METHOCARBAMOL 500 MG PO TABS   Oral   Take 1 tablet (500 mg total) by mouth 3 (three) times daily.   60 tablet   1   . MOMETASONE FURO-FORMOTEROL FUM 100-5 MCG/ACT IN AERO   Inhalation   Inhale 2 puffs into the lungs 2 (two) times daily.   3 Inhaler   0   . MONTELUKAST SODIUM 10 MG PO TABS   Oral   Take 1 tablet (10 mg total) by mouth at bedtime.   30 tablet   5   . OLMESARTAN MEDOXOMIL 40 MG PO TABS   Oral   Take 1 tablet (40 mg total) by mouth daily.   30 tablet   11   . PANTOPRAZOLE SODIUM 40 MG PO TBEC   Oral   Take 40 mg by mouth 2 (two) times daily.         [name]  MIRALAX PO   Oral   Take 17 g by mouth daily. As directed          . POTASSIUM CHLORIDE 10 MEQ PO CPCR   Oral   Take 10 mEq by mouth daily.           [name] SITAGLIPTIN-METFORMIN HCL 50-500 MG PO TABS   Oral   Take 1 tablet by mouth 2 (two) times daily with a meal.   112 tablet    0   . TRAZODONE HCL 100 MG PO TABS   Oral   Take 100 mg by mouth at bedtime. 1 and 1/2 pills at hs         . VENLAFAXINE HCL ER 150 MG PO CP24   Oral   Take 150 mg by mouth 2 (two) times daily.             BP 177/111  Pulse 86  Temp 97.8 F (36.6 C) (Oral)  Resp 24  SpO2 100%  Physical Exam  Nursing note and vitals reviewed. Constitutional: She is oriented to person, place, and time. She appears well-developed and well-nourished. No distress.  HENT:  Head: Normocephalic and atraumatic.  Eyes: EOM are normal. Pupils are equal, round, and reactive to light.  Neck: Normal range of motion. Neck supple. JVD present.  Cardiovascular: Normal rate and regular rhythm.  Exam reveals no friction rub.   No murmur heard. Pulmonary/Chest: Effort normal. No respiratory distress. She has no wheezes. She has rales (L base > R base).  Abdominal: Soft. She exhibits no distension. There is no tenderness. There is no rebound.  Musculoskeletal: Normal range of motion. She exhibits edema (1+ bilaterally).  Neurological: She is alert and oriented to person, place, and time.  Skin: She is not diaphoretic.    ED Course  Procedures (including critical care time)  Labs Reviewed  CBC WITH DIFFERENTIAL - Abnormal; Notable for the following:    RBC 3.56 (*)     Hemoglobin 9.9 (*)     HCT 29.2 (*)     RDW 18.0 (*)     All other components within normal limits  BASIC METABOLIC PANEL - Abnormal; Notable for the following:    Sodium 125 (*)     Chloride 87 (*)     Glucose, Bld 188 (*)     GFR calc non Af Amer 58 (*)     GFR calc Af Amer 67 (*)     All other components within normal limits  PRO B NATRIURETIC PEPTIDE - Abnormal; Notable for the following:    Pro B Natriuretic peptide (BNP) 28602.0 (*)     All other components within normal limits  POCT I-STAT TROPONIN I  URINALYSIS, ROUTINE W REFLEX MICROSCOPIC   Dg Chest 2 View  12/05/2011  *RADIOLOGY REPORT*  Clinical Data: Shortness of  breath, cough.  CHEST - 2 VIEW  Comparison: 05/01/2011  Findings: Cardiomegaly with vascular congestion.  Diffuse interstitial prominence is stable, likely mild chronic interstitial lung disease.  Stable elevation of the right hemidiaphragm. Blunting of the right costophrenic angle compatible with small effusion.  No confluent airspace opacities.  IMPRESSION: Cardiomegaly, vascular congestion.  Stable chronic interstitial prominence.  Suspect small right pleural effusion.   Original Report Authenticated By: Charlett Nose, M.D.      1. DYSLIPIDEMIA   2. Anemia, iron deficiency   3. Acute on chronic combined systolic and diastolic heart failure      Date: 12/05/2011  Rate: 67  Rhythm:  normal sinus rhythm  QRS Axis: normal  Intervals: PR prolonged  ST/T Wave abnormalities: nonspecific ST/T changes  Conduction Disutrbances:first-degree A-V block   Narrative Interpretation:   Old EKG Reviewed: unchanged    MDM   41F with hx of CAD, CHF, DM p/w chest pain, SOB. Patient was recently discharged from a hospital in [place] where she was admitted for heart failure. She left AMA after receiving multiple doses of Lasix. Patient states she left 2 days ago, came home, states still feeling bad, and presented today.  Here hypoxic, still having chest pain, mild SOB. Here afebrile, hypertensive. L basilar rales. Hypoxic on room air down 70s, corrected easily on 3L [place]. JVD present. Concern for continued CHF vs CAD vs HCAP. Initial troponin 0.05, negative. BNP markedly elevated. This is consistent with a CHF exacerbation. Will [place] cards consulted and will likely admit.       Elwin Mocha, MD 12/05/11 [id number]

## 2011-12-06 ENCOUNTER — Encounter (HOSPITAL_COMMUNITY): Payer: Self-pay | Admitting: Anesthesiology

## 2011-12-06 LAB — COMPREHENSIVE METABOLIC PANEL
ALT: 11 U/L (ref 0–35)
Albumin: 2.5 g/dL — ABNORMAL LOW (ref 3.5–5.2)
Alkaline Phosphatase: 80 U/L (ref 39–117)
BUN: 11 mg/dL (ref 6–23)
Chloride: 92 mEq/L — ABNORMAL LOW (ref 96–112)
Potassium: 4 mEq/L (ref 3.5–5.1)
Sodium: 130 mEq/L — ABNORMAL LOW (ref 135–145)
Total Bilirubin: 0.5 mg/dL (ref 0.3–1.2)
Total Protein: 5.7 g/dL — ABNORMAL LOW (ref 6.0–8.3)

## 2011-12-06 LAB — GLUCOSE, CAPILLARY: Glucose-Capillary: 83 mg/dL (ref 70–99)

## 2011-12-06 LAB — TSH: TSH: 3.65 u[IU]/mL (ref 0.350–4.500)

## 2011-12-06 MED ORDER — ALBUTEROL SULFATE (5 MG/ML) 0.5% IN NEBU
2.5000 mg | INHALATION_SOLUTION | Freq: Four times a day (QID) | RESPIRATORY_TRACT | Status: DC
  Administered 2011-12-06 – 2011-12-10 (×17): 2.5 mg via RESPIRATORY_TRACT
  Filled 2011-12-06 (×17): qty 0.5

## 2011-12-06 NOTE — Progress Notes (Signed)
Spoke with MD about patient's blood pressures being high,185/94 most recently 167/102 before that.  Patient is asymptomatic. Will await orders and continue to monitor patient.

## 2011-12-06 NOTE — H&P (Signed)
[name], MD Physician Signed Cardiology Consult Note 12/05/2011 1:24 PM      CARDIOLOGY CONSULT NOTE      Patient ID: [name] MRN: [id number] DOB/AGE: [id number] 67 y.o.   Admit date: 12/05/2011   Primary Physician   [name], MD Primary Cardiologist  [facility] Reason for Consultation   CHF   [name] is a 66 y.o. female with a history of CAD and CHF. She went to visit family over the Thanksgiving holiday and was hospitalized in [facility] ([address], [place], [place] [address]; [phone]). She did not like the treatment she was receiving there and left AMA. She was still SOB and came to the ER today.    Her SOB began after she arrived in [place]. She admits to missing 1 dose of Lasix (yesterday) and admits to significant dietary indiscretion. She became more and more SOB with exertion and finally went to the hospital. She describes PND and orthopnea. She does not know how much weight she gained but had significant LE edema. She had some chest pain also, but no more than usual. She came home last pm and had a miserable night due to SOB. She is less swollen than on admission in [place] but still with extra fluid. She came into the ER and feels a little better after IV Lasix. While in the hospital, LE dopplers were negative by her report.      Past Medical History   Diagnosis  Date   .  DM     .  DYSLIPIDEMIA     .  OBESITY NOS - BMI approx 27     .  DEPRESSION, RECURRENT, IN PARTIAL REMISSION     .  HYPERTENSION     .  CORONARY ARTERY DISEASE         S/p Q wave MI 2002 with RCA stent and AngioJet   Cath 4/30 99% distal LAD stenosis unchanged from previous, 20% left main stenosis, patent stents   .  CONGESTIVE HEART FAILURE         EF 40-45% echo 05/2011   .  GERD     .  DIVERTICULOSIS, COLON, WITH HEMORRHAGE     .  BACK PAIN, LUMBAR, WITH RADICULOPATHY     .  OBSTRUCTIVE SLEEP APNEA       Anemia, Iron deficiency     .  Asthma            Past Surgical History   Procedure  Date   .  Uvuloplasty     .  Total abdominal hysterectomy w/ bilateral salpingoophorectomy     .  Abdominal hysterectomy     .  Cholecystectomy     .  Coronary angioplasty with stent placement  2002       Allergies   Allergen  Reactions   .  Amlodipine  Other (See Comments)       constipation   .  Ramipril  Cough   .  Adhesive (Tape)  Hives       "peels skin off"   .  Penicillins  Itching   .  Codeine  Rash    I have reviewed the patient's current medications    .  aspirin   81 mg  Oral  Daily   .  atorvastatin   40 mg  Oral  Daily   .  [COMPLETED] furosemide   40 mg  Intravenous  Once   .  furosemide  80 mg  Intravenous  BID   .  Gabapentin (PHN)   1 tablet  Oral  TID   .  insulin aspart   10-15 Units  Subcutaneous  TID AC   .  insulin glargine   10 Units  Subcutaneous  QHS   .  irbesartan   300 mg  Oral  Daily   .  methocarbamol   500 mg  Oral  TID   .  mometasone-formoterol   2 puff  Inhalation  BID   .  montelukast   10 mg  Oral  QHS   .  pantoprazole   40 mg  Oral  BID   .  potassium chloride   20 mEq  Oral  BID   .  sitaGLIPtan-metformin   1 tablet  Oral  BID WC   .  traZODone   150 mg  Oral  QHS   .  venlafaxine XR   150 mg  Oral  BID    acetaminophen, bisacodyl, nitroGLYCERIN, polyethylene glycol    Medication  Sig   acetaminophen (TYLENOL ARTHRITIS PAIN) 650 MG CR tablet  Take 1,300 mg by mouth every 8 (eight) hours as needed. For pain   albuterol (PROVENTIL HFA;VENTOLIN HFA) 108 (90 BASE) MCG/ACT inhaler  Inhale 2 puffs into the lungs every 6 (six) hours as needed. For shortness of breath   aspirin 81 MG tablet  Take 81 mg by mouth daily.     atorvastatin (LIPITOR) 40 MG tablet  Take 1 tablet (40 mg total) by mouth daily.   bisacodyl (DULCOLAX) 5 MG EC tablet  Take 10 mg by mouth at bedtime as needed. For constipation   CALCIUM-VITAMIN D PO  Take 1 tablet by mouth daily.   carvedilol (COREG) 25 MG tablet  Take 37.5 mg by  mouth 2 (two) times daily with a meal. Take one and one half tablet twice daily. PT WANTS BRAND ONLY   furosemide (LASIX) 80 MG tablet  Take 80 mg by mouth daily.   Gabapentin, PHN, 300 MG TABS  Take 1 tablet by mouth 3 (three) times daily.   insulin aspart (NOVOLOG) 100 UNIT/ML injection  Inject 10-15 Units into the skin 3 (three) times daily before meals. 15 units at breakfast, 10units at lunch and dinner   insulin glargine (LANTUS) 100 UNIT/ML injection  Inject 10-20 Units into the skin at bedtime. Per sliding scale   meloxicam (MOBIC) 7.5 MG tablet  Take 7.5 mg by mouth 2 (two) times daily.   methocarbamol (ROBAXIN) 500 MG tablet  Take 500 mg by mouth 3 (three) times daily.   mometasone-formoterol (DULERA) 100-5 MCG/ACT AERO  Inhale 2 puffs into the lungs 2 (two) times daily.   montelukast (SINGULAIR) 10 MG tablet  Take 10 mg by mouth at bedtime.   olmesartan (BENICAR) 40 MG tablet  Take 40 mg by mouth daily.   pantoprazole (PROTONIX) 40 MG tablet  Take 40 mg by mouth 2 (two) times daily.   Polyethylene Glycol 3350 (MIRALAX PO)  Take 17 g by mouth daily. As directed   potassium chloride 10 MEQ CR capsule  Take 10 mEq by mouth daily.     sitaGLIPtan-metformin (JANUMET) 50-500 MG per tablet  Take 1 tablet by mouth 2 (two) times daily with a meal.   traZODone (DESYREL) 100 MG tablet  Take 150 mg by mouth at bedtime. 1 and 1/2 pills at hs   venlafaxine (EFFEXOR XR) 150 MG 24 hr capsule  Take 150 mg  by mouth 2 (two) times daily.          History       Social History   .  Marital Status:  Married       Spouse Name:  N/A       Number of Children:  0   .  Years of Education:  N/A       Occupational History   .  Retired Insurance account manager for [employer]         Social History Main Topics   .  Smoking status:  Current Some Day Smoker -- 1.0 packs/day for 20 years       Types:  Cigarettes   .  Smokeless tobacco:  Never Used         Comment: Still smokes a little.   .  Alcohol  Use:  No   .  Drug Use:  No   .  Sexually Active:  Not Currently       Birth Control/ Protection:  Surgical       Other Topics  Concern   .  Not on file       Social History Narrative   .  No narrative on file      Family Status   Relation  Status  Death Age   .  Mother  Deceased  71       No CAD   .  Father  Deceased  2       No CAD       Family History   Problem  Relation  Age of Onset   .  Stroke  Mother     .  Breast cancer  Sister     .  Emphysema  Sister     .  Cancer  Sister         breast   .  Alcohol abuse  Brother     .  Colon cancer           uncles x 2   .  Prostate cancer  Brother        ROS: Cough productive of yellowish sputum but no fevers or chills. No melena. Chronic MS aches/pains. Was told to limit fluid to 1000 cc/day but drinks 2-24 oz glasses water daily. Full 14 point review of systems complete and found to be negative unless listed above.   Physical Exam: Blood pressure 187/102, pulse 86, temperature 98.8 F (37.1 C), temperature source Rectal, resp. rate 16, SpO2 100.00%.  General: Well developed, well nourished, female in acute respiratory distress Head: Eyes PERRLA, No xanthomas.   Normocephalic and atraumatic, oropharynx without edema or exudate. Dentition: poor Lungs: bilateral basilar crackles Heart: HRRR S1 S2, no rub/gallop,   murmur. pulses are 2+ both extrem, decreased in lower extrem due to edema.    Neck: No carotid bruits. No lymphadenopathy.  JVD at 12 cm. Abdomen: Bowel sounds present, abdomen soft and non-tender without masses or hernias noted. Msk:  No spine or cva tenderness. No weakness, no joint deformities or effusions. Extremities: No clubbing or cyanosis. 2+ edema.   Neuro: Alert and oriented X 3. No focal deficits noted. Psych:  Good affect, responds appropriately Skin: No rashes or lesions noted.   Labs:    Lab Results   Component  Value  Date     WBC  5.2  12/05/2011     HGB  9.9*  12/05/2011     HCT  29.2*  12/05/2011     MCV  82.0  12/05/2011     PLT  PLATELET CLUMPS NOTED ON SMEAR, COUNT APPEARS ADEQUATE  12/05/2011    No results found for this basename: INR in the last 72 hours   Lab  12/05/11 1146   NA  125*   K  4.9   CL  87*   CO2  26   BUN  12   CREATININE  0.99   CALCIUM  9.0   PROT  --   BILITOT  --   ALKPHOS  --   ALT  --   AST  --   GLUCOSE  188*    Basename  12/05/11 1207   TROPIPOC  0.05    Pro B Natriuretic peptide (BNP)   Date/Time  Value  Range  Status   12/05/2011 11:47 AM  28602.0*  0 - 125 pg/mL  Final   09/07/2011 10:08 AM  752.0*  0.0 - 100.0 pg/mL  Final    Echo: 05/03/2011 Study Conclusions - Left ventricle: The cavity size was normal. Wall thickness was increased in a pattern of mild LVH. Indeterminant diastolic function. Systolic function was mildly to moderately reduced. The estimated ejection fraction was in the range of 40% to 45%. Moderate to severe inferior and posterior hypokinesis. - Aortic valve: There was no stenosis. - Mitral valve: No significant regurgitation. - Left atrium: The atrium was mildly dilated. - Right ventricle: The cavity size was normal. Systolic function was normal. - Right atrium: The atrium was mildly dilated. - Tricuspid valve: No complete TR doppler jet so unable to estimate PA systolic pressure. - Inferior vena cava: The vessel was normal in size; the respirophasic diameter changes were in the normal range (= 50%); findings are consistent with normal central venous pressure. Impressions: - Normal LV size with mild LV hypertrophy. EF 40-45% with moderate to severe inferior and posterior hypokinesis. Normal RV size and systolic function. No significant valvular abnormalities.   ECG: 05-Dec-2011 10:37:49   Sinus rhythm with 1st degree A-V block Possible Inferior infarct , age undetermined Cannot rule out Anterior infarct , age undetermined Abnormal ECG Vent. rate 83 BPM PR interval 270 ms QRS duration 90  ms QT/QTc 392/460 ms P-R-T axes 62 80 53   Radiology:  Dg Chest 2 View 12/05/2011  *RADIOLOGY REPORT*  Clinical Data: Shortness of breath, cough.  CHEST - 2 VIEW  Comparison: 05/01/2011  Findings: Cardiomegaly with vascular congestion.  Diffuse interstitial prominence is stable, likely mild chronic interstitial lung disease.  Stable elevation of the right hemidiaphragm. Blunting of the right costophrenic angle compatible with small effusion.  No confluent airspace opacities.  IMPRESSION: Cardiomegaly, vascular congestion.  Stable chronic interstitial prominence.  Suspect small right pleural effusion.   Original Report Authenticated By: Charlett Nose, M.D.       ASSESSMENT AND PLAN:   The patient was seen today by Dr Jens Som, the patient evaluated and the data reviewed.   Principal Problem:  *Acute on chronic systolic CHF (congestive heart failure), NYHA class 4 - admit, IV Lasix, foley, follow labs, get records from [place], then decide on further eval.   Otherwise, continue home meds and fluid restriction of 1000 cc. Guaiac stools. Active Problems:  Type II or unspecified type diabetes mellitus without mention of complication, uncontrolled  DYSLIPIDEMIA  HYPERTENSION  Iron deficiency anemia  Constipation  Hyponatremia     Signed: Theodore Demark 12/05/2011, 2:45 PM Co-Sign MD As above, patient seen and examined. Briefly patient is  a 66 year old female with past medical history of coronary artery disease and congestive heart failure admitted with acute on chronic combined systolic and diastolic congestive heart failure. The patient recently visited [place]. While there she was noncompliant with her diet. She ate ham and potato chips. She developed progressive dyspnea on exertion, orthopnea, PND and pedal edema. She had occasional mild chest pain which has been a chronic issue. She was admitted to a hospital there and underwent diuresis. However she preferred to return to [facility] and  left AGAINST MEDICAL ADVICE yesterday. Her symptoms were slowly improving but not at baseline. She continues to have dyspnea on exertion and pedal edema. Exam is significant for jugular venous distention, mildly diminished breath sounds at the bases and 2+ lower extremity edema. Chest x-ray shows vascular congestion and possible small right effusion. Sodium is 125 with normal renal function. ProBNP is 28,602. Hemoglobin is 9.9 with an MCV of 82. EKG shows sinus rhythm, first degree AV block, prior inferior infarct and prior anterior infarct cannot be excluded. Plan admit and diurese with Lasix 80 mg IV twice a day. Fluid restrict. Follow sodium, potassium and renal function. Obtain records from outside hospital concerning venous Dopplers which patient states were negative and echocardiogram report. Patient's blood pressure is significantly elevated. She has not taken her medications today. Continue preadmission dose of ARB and beta blocker. Adjust medications based on followup readings. Discontinue tobacco abuse. Olga Millers 3:08 PM   Above is relabeled H and P from 12/3 (previously labeled consult note). Olga Millers

## 2011-12-06 NOTE — Progress Notes (Signed)
Nutrition Brief Note and Education  Patient identified on the Malnutrition Screening Tool (MST) Report. Upon further chart review, pt with fairly stable weight, with some weight fluctuations likely 2/2 fluid status. Currently eating 90% of meals.  Pt noted to have poor compliance with HF nutrition therapy. RD provided "Heart Failure Nutrition Therapy" handout from the Academy of [employer]etics. Reviewed patient's dietary recall. Provided examples on ways to decrease sodium intake in diet. Discouraged intake of processed foods and use of salt shaker. Encouraged fresh fruits and vegetables as well as whole grain sources of carbohydrates to maximize fiber intake.   RD discussed why it is important for patient to adhere to diet recommendations, and emphasized the role of fluids, foods to avoid, and importance of weighing self daily.  Expect poor compliance.  Body mass index is 28.31 kg/(m^2). Pt meets criteria for overweight based on current BMI.   Current diet order is Carbohydrate Modified Medium, patient is consuming approximately 90% of meals at this time. Labs and medications reviewed.   No nutrition interventions warranted at this time. If nutrition issues arise, please consult RD.   Jarold Motto MS, RD, LDN Pager: [phone] After-hours pager: [phone]

## 2011-12-06 NOTE — Progress Notes (Signed)
SUBJECTIVE:  The patient is breathing slightly better today but not at baseline.  No pain.     PHYSICAL EXAM Filed Vitals:   12/06/11 0059 12/06/11 0200 12/06/11 0353 12/06/11 0500  BP: 169/85 142/66 164/82   Pulse: 70  60   Temp: 98.6 F (37 C)  98.8 F (37.1 C)   TempSrc: Oral  Oral   Resp: 21  19   Height:      Weight:    197 lb 5 oz (89.5 kg)  SpO2: 92%  100%    General:  No acute distresss Neck:  No JVD Lungs:  Diffuse mild expiratory wheezing Heart:  RRR Abdomen:  Positive bowel sounds, no rebound no guarding Extremities:  Moderate edema Neuro:  Nonfocal  LABS: Lab Results  Component Value Date   CKTOTAL 74 05/02/2011   CKMB 3.2 05/02/2011   TROPONINI <0.30 12/06/2011   Results for orders placed during the hospital encounter of 12/05/11 (from the past 24 hour(s))  CBC WITH DIFFERENTIAL     Status: Abnormal   Collection Time   12/05/11 11:46 AM      Component Value Range   WBC 5.2  4.0 - 10.5 K/uL   RBC 3.56 (*) 3.87 - 5.11 MIL/uL   Hemoglobin 9.9 (*) 12.0 - 15.0 g/dL   HCT 78.2 (*) 95.6 - 21.3 %   MCV 82.0  78.0 - 100.0 fL   MCH 27.8  26.0 - 34.0 pg   MCHC 33.9  30.0 - 36.0 g/dL   RDW 08.6 (*) 57.8 - 46.9 %   Platelets    150 - 400 K/uL   Value: PLATELET CLUMPS NOTED ON SMEAR, COUNT APPEARS ADEQUATE   Neutrophils Relative 61  43 - 77 %   Lymphocytes Relative 28  12 - 46 %   Monocytes Relative 9  3 - 12 %   Eosinophils Relative 1  0 - 5 %   Basophils Relative 1  0 - 1 %   Neutro Abs 3.0  1.7 - 7.7 K/uL   Lymphs Abs 1.5  0.7 - 4.0 K/uL   Monocytes Absolute 0.5  0.1 - 1.0 K/uL   Eosinophils Absolute 0.1  0.0 - 0.7 K/uL   Basophils Absolute 0.1  0.0 - 0.1 K/uL   RBC Morphology BURR CELLS    BASIC METABOLIC PANEL     Status: Abnormal   Collection Time   12/05/11 11:46 AM      Component Value Range   Sodium 125 (*) 135 - 145 mEq/L   Potassium 4.9  3.5 - 5.1 mEq/L   Chloride 87 (*) 96 - 112 mEq/L   CO2 26  19 - 32 mEq/L   Glucose, Bld 188 (*) 70 - 99  mg/dL   BUN 12  6 - 23 mg/dL   Creatinine, Ser 6.29  0.50 - 1.10 mg/dL   Calcium 9.0  8.4 - 52.8 mg/dL   GFR calc non Af Amer 58 (*) >90 mL/min   GFR calc Af Amer 67 (*) >90 mL/min  PRO B NATRIURETIC PEPTIDE     Status: Abnormal   Collection Time   12/05/11 11:47 AM      Component Value Range   Pro B Natriuretic peptide (BNP) 28602.0 (*) 0 - 125 pg/mL  POCT I-STAT TROPONIN I     Status: Normal   Collection Time   12/05/11 12:07 PM      Component Value Range   Troponin i, poc 0.05  0.00 - 0.08  ng/mL   Comment 3           URINALYSIS, ROUTINE W REFLEX MICROSCOPIC     Status: Abnormal   Collection Time   12/05/11  3:22 PM      Component Value Range   Color, Urine YELLOW  YELLOW   APPearance CLEAR  CLEAR   Specific Gravity, Urine 1.012  1.005 - 1.030   pH 6.5  5.0 - 8.0   Glucose, UA NEGATIVE  NEGATIVE mg/dL   Hgb urine dipstick NEGATIVE  NEGATIVE   Bilirubin Urine NEGATIVE  NEGATIVE   Ketones, ur NEGATIVE  NEGATIVE mg/dL   Protein, ur 30 (*) NEGATIVE mg/dL   Urobilinogen, UA 1.0  0.0 - 1.0 mg/dL   Nitrite NEGATIVE  NEGATIVE   Leukocytes, UA NEGATIVE  NEGATIVE  URINE MICROSCOPIC-ADD ON     Status: Abnormal   Collection Time   12/05/11  3:22 PM      Component Value Range   Squamous Epithelial / LPF RARE  RARE   WBC, UA 0-2  <3 WBC/hpf   RBC / HPF 0-2  <3 RBC/hpf   Bacteria, UA RARE  RARE   Casts HYALINE CASTS (*) NEGATIVE  GLUCOSE, CAPILLARY     Status: Abnormal   Collection Time   12/05/11  5:11 PM      Component Value Range   Glucose-Capillary 168 (*) 70 - 99 mg/dL  HEMOGLOBIN Z6X     Status: Abnormal   Collection Time   12/05/11  5:24 PM      Component Value Range   Hemoglobin A1C 7.4 (*) <5.7 %   Mean Plasma Glucose 166 (*) <117 mg/dL  TROPONIN I     Status: Normal   Collection Time   12/05/11  7:49 PM      Component Value Range   Troponin I <0.30  <0.30 ng/mL  PROTIME-INR     Status: Abnormal   Collection Time   12/05/11  7:49 PM      Component Value Range    Prothrombin Time 16.3 (*) 11.6 - 15.2 seconds   INR 1.34  0.00 - 1.49  TSH     Status: Normal   Collection Time   12/05/11  7:49 PM      Component Value Range   TSH 3.650  0.350 - 4.500 uIU/mL  CBC     Status: Abnormal   Collection Time   12/05/11  7:49 PM      Component Value Range   WBC 4.5  4.0 - 10.5 K/uL   RBC 3.29 (*) 3.87 - 5.11 MIL/uL   Hemoglobin 9.3 (*) 12.0 - 15.0 g/dL   HCT 09.6 (*) 04.5 - 40.9 %   MCV 83.6  78.0 - 100.0 fL   MCH 28.3  26.0 - 34.0 pg   MCHC 33.8  30.0 - 36.0 g/dL   RDW 81.1 (*) 91.4 - 78.2 %   Platelets 326  150 - 400 K/uL  CREATININE, SERUM     Status: Abnormal   Collection Time   12/05/11  7:49 PM      Component Value Range   Creatinine, Ser 0.96  0.50 - 1.10 mg/dL   GFR calc non Af Amer 60 (*) >90 mL/min   GFR calc Af Amer 69 (*) >90 mL/min  MRSA PCR SCREENING     Status: Normal   Collection Time   12/05/11  7:59 PM      Component Value Range   MRSA by PCR NEGATIVE  NEGATIVE  GLUCOSE, CAPILLARY     Status: Abnormal   Collection Time   12/05/11 10:14 PM      Component Value Range   Glucose-Capillary 157 (*) 70 - 99 mg/dL  TROPONIN I     Status: Normal   Collection Time   12/06/11  1:19 AM      Component Value Range   Troponin I <0.30  <0.30 ng/mL    Intake/Output Summary (Last 24 hours) at 12/06/11 1610 Last data filed at 12/06/11 9604  Gross per 24 hour  Intake    220 ml  Output   2650 ml  Net  -2430 ml    ASSESSMENT AND PLAN:  Acute on chronic combined systolic and diastolic heart failure: She is better but seems to have increased volume still.  I will continue the IV Lasix.  I will also give albuterol.    Type II or unspecified type diabetes mellitus without mention of complication, uncontrolled: A1C is 7.4.  She will continue the current medications.   DYSLIPIDEMIA I will repeat a lipid profile.  The last in our system was in Sept.  HYPERTENSION She might need further med titration prior to discharge.   Hyponatremia Na is  slightly better.  We will follow this.    Anemia I will follow up a CBC in the AM.   Tobacco abuse Continue education.   Fayrene Fearing [facility] 12/06/2011 6:53 AM

## 2011-12-07 LAB — LIPID PANEL
HDL: 27 mg/dL — ABNORMAL LOW (ref >39)
LDL Cholesterol: 76 mg/dL (ref 0–99)
Triglycerides: 83 mg/dL (ref <150)
VLDL: 17 mg/dL (ref 0–40)

## 2011-12-07 LAB — GLUCOSE, CAPILLARY
Glucose-Capillary: 85 mg/dL (ref 70–99)
Glucose-Capillary: 118 mg/dL — ABNORMAL HIGH (ref 70–99)
Glucose-Capillary: 110 mg/dL — ABNORMAL HIGH (ref 70–99)
Glucose-Capillary: 70 mg/dL (ref 70–99)

## 2011-12-07 LAB — CBC
HCT: 26.6 % — ABNORMAL LOW (ref 36.0–46.0)
MCH: 27.8 pg (ref 26.0–34.0)
MCV: 84.2 fL (ref 78.0–100.0)
RDW: 18.5 % — ABNORMAL HIGH (ref 11.5–15.5)
WBC: 4.5 10*3/uL (ref 4.0–10.5)

## 2011-12-07 LAB — BASIC METABOLIC PANEL
CO2: 32 mEq/L (ref 19–32)
Calcium: 8.8 mg/dL (ref 8.4–10.5)
Chloride: 93 mEq/L — ABNORMAL LOW (ref 96–112)
Creatinine, Ser: 1.17 mg/dL — ABNORMAL HIGH (ref 0.50–1.10)
Glucose, Bld: 49 mg/dL — ABNORMAL LOW (ref 70–99)

## 2011-12-07 MED ORDER — HYDRALAZINE HCL 10 MG PO TABS
10.0000 mg | ORAL_TABLET | Freq: Three times a day (TID) | ORAL | Status: DC
  Administered 2011-12-07 – 2011-12-11 (×13): 10 mg via ORAL
  Filled 2011-12-07 (×16): qty 1

## 2011-12-07 NOTE — Progress Notes (Signed)
SUBJECTIVE:  Dyspnea improving; no chest pain   PHYSICAL EXAM Filed Vitals:   12/07/11 0200 12/07/11 0351 12/07/11 0400 12/07/11 0500  BP: 134/88 143/83 152/80   Pulse: 56 59 57   Temp:   99 F (37.2 C)   TempSrc:   Oral   Resp: 26 24 24    Height:      Weight:  196 lb 3.4 oz (89 kg)  190 lb 7.6 oz (86.4 kg)  SpO2: 100% 100% 99%    General:  No acute distresss Neck:  No JVD Lungs:  Mildly diminished BS bases Heart:  RRR Abdomen:  NT/ND, soft Extremities:  1+ edema Neuro:  Nonfocal  LABS: Lab Results  Component Value Date   CKTOTAL 74 05/02/2011   CKMB 3.2 05/02/2011   TROPONINI <0.30 12/06/2011   Results for orders placed during the hospital encounter of 12/05/11 (from the past 24 hour(s))  COMPREHENSIVE METABOLIC PANEL     Status: Abnormal   Collection Time   12/06/11  7:15 AM      Component Value Range   Sodium 130 (*) 135 - 145 mEq/L   Potassium 4.0  3.5 - 5.1 mEq/L   Chloride 92 (*) 96 - 112 mEq/L   CO2 31  19 - 32 mEq/L   Glucose, Bld 83  70 - 99 mg/dL   BUN 11  6 - 23 mg/dL   Creatinine, Ser 1.61  0.50 - 1.10 mg/dL   Calcium 8.6  8.4 - 09.6 mg/dL   Total Protein 5.7 (*) 6.0 - 8.3 g/dL   Albumin 2.5 (*) 3.5 - 5.2 g/dL   AST 19  0 - 37 U/L   ALT 11  0 - 35 U/L   Alkaline Phosphatase 80  39 - 117 U/L   Total Bilirubin 0.5  0.3 - 1.2 mg/dL   GFR calc non Af Amer 57 (*) >90 mL/min   GFR calc Af Amer 66 (*) >90 mL/min  TROPONIN I     Status: Normal   Collection Time   12/06/11  7:15 AM      Component Value Range   Troponin I <0.30  <0.30 ng/mL  GLUCOSE, CAPILLARY     Status: Normal   Collection Time   12/06/11  7:53 AM      Component Value Range   Glucose-Capillary 84  70 - 99 mg/dL  GLUCOSE, CAPILLARY     Status: Normal   Collection Time   12/06/11 11:51 AM      Component Value Range   Glucose-Capillary 97  70 - 99 mg/dL  GLUCOSE, CAPILLARY     Status: Normal   Collection Time   12/06/11  4:52 PM      Component Value Range   Glucose-Capillary 83  70  - 99 mg/dL  GLUCOSE, CAPILLARY     Status: Abnormal   Collection Time   12/06/11  9:00 PM      Component Value Range   Glucose-Capillary 104 (*) 70 - 99 mg/dL  BASIC METABOLIC PANEL     Status: Abnormal   Collection Time   12/07/11  4:40 AM      Component Value Range   Sodium 132 (*) 135 - 145 mEq/L   Potassium 4.2  3.5 - 5.1 mEq/L   Chloride 93 (*) 96 - 112 mEq/L   CO2 32  19 - 32 mEq/L   Glucose, Bld 49 (*) 70 - 99 mg/dL   BUN 10  6 - 23 mg/dL  Creatinine, Ser 1.17 (*) 0.50 - 1.10 mg/dL   Calcium 8.8  8.4 - 16.1 mg/dL   GFR calc non Af Amer 47 (*) >90 mL/min   GFR calc Af Amer 55 (*) >90 mL/min  CBC     Status: Abnormal   Collection Time   12/07/11  4:40 AM      Component Value Range   WBC 4.5  4.0 - 10.5 K/uL   RBC 3.16 (*) 3.87 - 5.11 MIL/uL   Hemoglobin 8.8 (*) 12.0 - 15.0 g/dL   HCT 09.6 (*) 04.5 - 40.9 %   MCV 84.2  78.0 - 100.0 fL   MCH 27.8  26.0 - 34.0 pg   MCHC 33.1  30.0 - 36.0 g/dL   RDW 81.1 (*) 91.4 - 78.2 %   Platelets 354  150 - 400 K/uL  LIPID PANEL     Status: Abnormal   Collection Time   12/07/11  4:40 AM      Component Value Range   Cholesterol 120  0 - 200 mg/dL   Triglycerides 83  <956 mg/dL   HDL 27 (*) >21 mg/dL   Total CHOL/HDL Ratio 4.4     VLDL 17  0 - 40 mg/dL   LDL Cholesterol 76  0 - 99 mg/dL    Intake/Output Summary (Last 24 hours) at 12/07/11 3086 Last data filed at 12/07/11 5784  Gross per 24 hour  Intake    720 ml  Output   2700 ml  Net  -1980 ml    ASSESSMENT AND PLAN:  Acute on chronic combined systolic and diastolic heart failure: Patient's volume status is improving; continue present dose of lasix and follow renal function. Possible DC 24-48 hours. Need outside records concerning recent echo.    Type II or unspecified type diabetes mellitus without mention of complication, uncontrolled: A1C is 7.4.  She will continue the current medications.   DYSLIPIDEMIA Continue statin.  HYPERTENSION BP mildly elevated; add  hydralazine and titrate as needed.  Hyponatremia Na is improved.  Anemia Normocytic anemia-outpatient wu.  Tobacco abuse Continue education.   [name] 12/07/2011 7:09 AM

## 2011-12-07 NOTE — Progress Notes (Signed)
Inpatient Diabetes Program Recommendations  AACE/ADA: New Consensus Statement on Inpatient Glycemic Control (2013)  Target Ranges:  Prepandial:   less than 140 mg/dL      Peak postprandial:   less than 180 mg/dL (1-2 hours)      Critically ill patients:  140 - 180 mg/dL   HYPOglycemia this morning with CBG=44 and lab glucose= 49  Inpatient Diabetes Program Recommendations Insulin - Meal Coverage: Discontinue Novolog 5 units TID with meals  Lantus may need to be adjusted down as well. Will follow. Thank you  Piedad Climes [facility] Inpatient Diabetes Coordinator [phone]

## 2011-12-07 NOTE — Progress Notes (Signed)
Hypoglycemic Event  CBG: 44  Treatment: 15 GM carbohydrate snack  Symptoms: Shaky  Follow-up CBG: Time:0830 CBG Result:85  Possible Reasons for Event: Inadequate meal intake  Comments/MD notified:[place]    Myli Pae L  Remember to initiate Hypoglycemia Order Set & complete

## 2011-12-08 DIAGNOSIS — I509 Heart failure, unspecified: Secondary | ICD-10-CM

## 2011-12-08 DIAGNOSIS — R0609 Other forms of dyspnea: Secondary | ICD-10-CM

## 2011-12-08 DIAGNOSIS — G4733 Obstructive sleep apnea (adult) (pediatric): Secondary | ICD-10-CM

## 2011-12-08 LAB — BASIC METABOLIC PANEL
Calcium: 8.9 mg/dL (ref 8.4–10.5)
Creatinine, Ser: 1.15 mg/dL — ABNORMAL HIGH (ref 0.50–1.10)
GFR calc Af Amer: 56 mL/min — ABNORMAL LOW (ref >90)
GFR calc non Af Amer: 48 mL/min — ABNORMAL LOW (ref >90)

## 2011-12-08 LAB — GLUCOSE, CAPILLARY
Glucose-Capillary: 67 mg/dL — ABNORMAL LOW (ref 70–99)
Glucose-Capillary: 87 mg/dL (ref 70–99)

## 2011-12-08 MED ORDER — GUAIFENESIN-DM 100-10 MG/5ML PO SYRP
5.0000 mL | ORAL_SOLUTION | ORAL | Status: DC | PRN
  Administered 2011-12-08 – 2011-12-11 (×7): 5 mL via ORAL
  Filled 2011-12-08 (×7): qty 5

## 2011-12-08 MED ORDER — GUAIFENESIN ER 600 MG PO TB12
600.0000 mg | ORAL_TABLET | Freq: Two times a day (BID) | ORAL | Status: DC
  Administered 2011-12-08 – 2011-12-11 (×7): 600 mg via ORAL
  Filled 2011-12-08 (×8): qty 1

## 2011-12-08 NOTE — Progress Notes (Signed)
Hypoglycemic Event  CBG: 67  Treatment: 15 GM carbohydrate snack  Symptoms: None  Follow-up CBG: Time:1650 CBG Result 74  Possible Reasons for Event: Inadequate meal intake  Comments/MD notified: 1527  NP stated give the Metformn. Talked with Pt not to ask for meal coverage insulin if not eating >50% of meal, that the fruit she eating is not enough to off set the insulin. Will continue to monitor     Raigan Baria, Brantley Stage  Remember to initiate Hypoglycemia Order Set & complete

## 2011-12-08 NOTE — Progress Notes (Signed)
Hypoglycemic Event  CBG: 59  Treatment: 15 GM carbohydrate snack  Symptoms: None  Follow-up CBG: Time:1645 CBG Result:67  Possible Reasons for Event: Inadequate meal intake  Comments/MD notified: 1725     Elke Holtry, Brantley Stage  Remember to initiate Hypoglycemia Order Set & complete

## 2011-12-08 NOTE — Progress Notes (Signed)
Pt did not eat 50% of meal, but stated she wanted the 5u of meal coverage plus 1U of side scale, since she was going to eat fruit that her husband had brought in. wil continue to monitor

## 2011-12-08 NOTE — Progress Notes (Signed)
SUBJECTIVE:  The patient is breathing slightly better today but still not at baseline.  No pain.     PHYSICAL EXAM Filed Vitals:   12/07/11 2129 12/08/11 0007 12/08/11 0123 12/08/11 0348  BP: 153/85 146/61  165/89  Pulse:  67  76  Temp:  98.3 F (36.8 C)  98.7 F (37.1 C)  TempSrc:  Oral  Oral  Resp:  22  22  Height:      Weight:  203 lb 14.8 oz (92.5 kg)    SpO2:  94% 93% 90%   General:  No acute distresss Neck:  No JVD Lungs:  Diffuse mild expiratory wheezing Heart:  RRR Abdomen:  Positive bowel sounds, no rebound no guarding Extremities:  Mildedema Neuro:  Nonfocal  LABS: Lab Results  Component Value Date   CKTOTAL 74 05/02/2011   CKMB 3.2 05/02/2011   TROPONINI <0.30 12/06/2011   Results for orders placed during the hospital encounter of 12/05/11 (from the past 24 hour(s))  GLUCOSE, CAPILLARY     Status: Abnormal   Collection Time   12/07/11  8:06 AM      Component Value Range   Glucose-Capillary 44 (*) 70 - 99 mg/dL  GLUCOSE, CAPILLARY     Status: Normal   Collection Time   12/07/11  8:32 AM      Component Value Range   Glucose-Capillary 85  70 - 99 mg/dL  GLUCOSE, CAPILLARY     Status: Abnormal   Collection Time   12/07/11 11:51 AM      Component Value Range   Glucose-Capillary 118 (*) 70 - 99 mg/dL   Comment 1 Documented in Chart     Comment 2 Notify RN    GLUCOSE, CAPILLARY     Status: Abnormal   Collection Time   12/07/11  4:48 PM      Component Value Range   Glucose-Capillary 110 (*) 70 - 99 mg/dL  GLUCOSE, CAPILLARY     Status: Normal   Collection Time   12/07/11  9:35 PM      Component Value Range   Glucose-Capillary 70  70 - 99 mg/dL   Comment 1 Documented in Chart     Comment 2 Notify RN    BASIC METABOLIC PANEL     Status: Abnormal   Collection Time   12/08/11  5:25 AM      Component Value Range   Sodium 134 (*) 135 - 145 mEq/L   Potassium 4.9  3.5 - 5.1 mEq/L   Chloride 93 (*) 96 - 112 mEq/L   CO2 31  19 - 32 mEq/L   Glucose, Bld 91   70 - 99 mg/dL   BUN 10  6 - 23 mg/dL   Creatinine, Ser 8.11 (*) 0.50 - 1.10 mg/dL   Calcium 8.9  8.4 - 91.4 mg/dL   GFR calc non Af Amer 48 (*) >90 mL/min   GFR calc Af Amer 56 (*) >90 mL/min    Intake/Output Summary (Last 24 hours) at 12/08/11 0730 Last data filed at 12/08/11 0349  Gross per 24 hour  Intake    840 ml  Output   1825 ml  Net   -985 ml    ASSESSMENT AND PLAN:  Acute on chronic combined systolic and diastolic heart failure: Weight is increased (?).  Down greater than 5 liters since admission.  Still with SOB and some wheezing.  I am going to continue the IV Lasix today.  I will consult pulmonary to  suggest any treatment for a possible pulmonary component to her acute symptoms.  (Please note that she has tolerated Coreg up to this point for years.)  Type II or unspecified type diabetes mellitus without mention of complication, uncontrolled: A1C is 7.4.  She will continue the current medications.   DYSLIPIDEMIA LDL was 76.  She will continue the meds as listed.   HYPERTENSION Hydralazine added.  We will continue to titrate.   Hyponatremia Improved   Anemia Hgb is falling. Guaiac pending.   Tobacco abuse Continue education.   [name] 12/08/2011 7:30 AM

## 2011-12-08 NOTE — Consult Note (Signed)
PULMONARY  / CRITICAL CARE MEDICINE  Name: [name] MRN: [id number] DOB: [birth year]    LOS: 3  REFERRING PROVIDER:  [facility]  Pulm - Sood  CHIEF COMPLAINT:  Persistent dyspnea    HISTORY OF PRESENT ILLNESS:  66/F ,smoker with systolic CHF, chronic bronchitis/asthma, and OSA.  She went to [place] to visit family over Thanksgiving  and was hospitalized. She left AMA from there and was adm to [facility]  via ER on 12/3.  She admits to missing 1 dose of Lasix  and to salt indiscretion. Adm labs showed BNP of 28k, Na 125  She had significant LE edema & has diuresed 6L since adm.  While in the hospital, LE dopplers were negative by her report, also neg 5/13 Echo 5/13 - EF 40-45%, Mod- to severe inferior and posterior hypokinesis.  She was last seen 09/27/11 by [facility]) . Spirometry being nml, she was felt to have asthmatic bronchicits clearly worsened by smoking She was placed on dulera twice per day, and singulair at night. She reports dulera is not helping much. Vaccines are upto date. She reports a cough with yellow phlegm . She has tolerated coreg for many years per cards  Tests:  PSG 03/28/06>>AHI 15  CPAP 10 cm  CPAP 02/18/11 to 03/29/11>>Used on 17 of 30 nights with median 4 hrs 19 min.  Spirometry 03/09/11>>FEV1 2.02 (88%), FEV1% 76  CXR 12/3 showed Cardiomegaly, vascular congestion. Stable chronic interstitial prominence. Suspect small right pleural effusion with fluid in rt fissure     PAST MEDICAL HISTORY :  Past Medical History  Diagnosis Date  . DM   . DYSLIPIDEMIA   . OBESITY NOS   . DEPRESSION, RECURRENT, IN PARTIAL REMISSION   . HYPERTENSION   . 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 CONGESTIVE HEART FAILURE     EF 40-45% echo 05/2011  . GERD   . DIVERTICULOSIS, COLON, WITH HEMORRHAGE   . BACK PAIN, LUMBAR, WITH  RADICULOPATHY   . OBSTRUCTIVE SLEEP APNEA   . Asthma   . Anemia, iron deficiency    Past Surgical History  Procedure Date  . Uvuloplasty   . Total abdominal hysterectomy w/ bilateral salpingoophorectomy   . Abdominal hysterectomy   . Cholecystectomy   . Coronary angioplasty with stent placement 2002   Prior to Admission medications   Medication Sig Start Date End Date Taking? Authorizing Provider  acetaminophen (TYLENOL ARTHRITIS PAIN) 650 MG CR tablet Take 1,300 mg by mouth every 8 (eight) hours as needed. For pain   Yes Historical Provider, MD  albuterol (PROVENTIL HFA;VENTOLIN HFA) 108 (90 BASE) MCG/ACT inhaler Inhale 2 puffs into the lungs every 6 (six) hours as needed. For shortness of breath 09/27/11  Yes [name], MD  aspirin 81 MG tablet Take 81 mg by mouth daily.     Yes Historical Provider, MD  atorvastatin (LIPITOR) 40 MG tablet Take 1 tablet (40 mg total) by mouth daily. 01/30/11 01/30/12 Yes [name], MD  bisacodyl (DULCOLAX) 5 MG EC tablet Take 10 mg by mouth at bedtime as needed. For constipation   Yes Historical Provider, MD  CALCIUM-VITAMIN D PO Take 1 tablet by mouth daily.   Yes Historical Provider, MD  carvedilol (COREG) 25 MG tablet Take 37.5 mg by mouth 2 (two) times daily with a meal.  Take one and one half tablet twice daily. PT WANTS BRAND ONLY 05/30/11  Yes [name], MD  furosemide (LASIX) 80 MG tablet Take 80 mg by mouth daily. 05/30/11  Yes [name], MD  Gabapentin, PHN, 300 MG TABS Take 1 tablet by mouth 3 (three) times daily. 10/20/11  Yes Clydie Braun Prueter, PA-C  insulin aspart (NOVOLOG) 100 UNIT/ML injection Inject 10-15 Units into the skin 3 (three) times daily before meals. Sliding scale.    15 units at breakfast, 10units at lunch,10 units at dinner   Yes Historical Provider, [place]  insulin glargine (LANTUS) 100 UNIT/ML injection Inject 10-20 Units into the skin at bedtime. Per sliding scale   Yes Historical Provider, MD  meloxicam (MOBIC) 7.5 MG  tablet Take 7.5 mg by mouth 2 (two) times daily. 10/20/11  Yes Karen Prueter, PA-C  methocarbamol (ROBAXIN) 500 MG tablet Take 500 mg by mouth 3 (three) times daily. 10/20/11  Yes Karen Prueter, PA-C  mometasone-formoterol (DULERA) 100-5 MCG/ACT AERO Inhale 2 puffs into the lungs 2 (two) times daily. 09/07/11  Yes [name], MD  montelukast (SINGULAIR) 10 MG tablet Take 10 mg by mouth at bedtime. 07/04/11 07/03/12 Yes [name], MD  olmesartan (BENICAR) 40 MG tablet Take 40 mg by mouth daily. 07/28/11 07/27/12 Yes [name], MD  pantoprazole (PROTONIX) 40 MG tablet Take 40 mg by mouth 2 (two) times daily. 04/26/11  Yes [name], MD  Polyethylene Glycol 3350 (MIRALAX PO) Take 17 g by mouth daily. As directed    Yes Historical Provider, MD  potassium chloride (MICRO-K) 10 MEQ CR capsule Take 10 mEq by mouth daily.     Yes Historical Provider, MD  sitaGLIPtan-metformin (JANUMET) 50-500 MG per tablet Take 1 tablet by mouth 2 (two) times daily with a meal. 06/27/11 06/26/12 Yes [name], MD  traZODone (DESYREL) 100 MG tablet Take 150 mg by mouth at bedtime. 1 and 1/2 pills at hs   Yes Historical Provider, MD  venlafaxine (EFFEXOR XR) 150 MG 24 hr capsule Take 150 mg by mouth 2 (two) times daily.     Yes Historical Provider, MD   Allergies  Allergen Reactions  . Amlodipine Other (See Comments)    constipation  . Ramipril Cough  . Adhesive (Tape) Hives    "peels skin off"  . Penicillins Itching  . Codeine Rash    FAMILY HISTORY:  Family History  Problem Relation Age of Onset  . Stroke Mother   . Breast cancer Sister   . Emphysema Sister   . Cancer Sister     breast  . Alcohol abuse Brother   . Colon cancer      uncles x 2  . Prostate cancer Brother    SOCIAL HISTORY:  reports that she has been smoking Cigarettes.  She has a 20 pack-year smoking history. She has never used smokeless tobacco. She reports that she does not drink alcohol or use illicit drugs.  REVIEW OF  SYSTEMS:   Constitutional: Negative for fever, chills, weight loss, malaise/fatigue and diaphoresis.  HENT: Negative for hearing loss, ear pain, nosebleeds, congestion, sore throat, neck pain, tinnitus and ear discharge.   Eyes: Negative for blurred vision, double vision, photophobia, pain, discharge and redness.  Respiratory: Negative for cough, hemoptysis, sputum production, wheezing and stridor, shortness of breath is much improved Cardiovascular: Negative for chest pain, palpitations, orthopnea, claudication,  and PND. POS  leg swelling Gastrointestinal: Negative for heartburn, nausea, vomiting, abdominal pain, diarrhea, constipation, blood in stool  and melena.  Genitourinary: Negative for dysuria, urgency, frequency, hematuria and flank pain.  Musculoskeletal: Negative for myalgias, back pain, joint pain and falls.  Skin: Negative for itching and rash.  Neurological: Negative for dizziness, tingling, tremors, sensory change, speech change, focal weakness, seizures, loss of consciousness, weakness and headaches.  Endo/Heme/Allergies: Negative for environmental allergies and polydipsia. Does not bruise/bleed easily.  INTERVAL HISTORY:   VITAL SIGNS: Temp:  [98.1 F (36.7 C)-98.7 F (37.1 C)] 98.5 F (36.9 C) (12/06 0730) Pulse Rate:  [57-76] 69  (12/06 0730) Resp:  [17-25] 17  (12/06 0730) BP: (121-165)/(61-99) 152/88 mmHg (12/06 0730) SpO2:  [90 %-100 %] 95 % (12/06 0730) Weight:  [92.5 kg (203 lb 14.8 oz)] 92.5 kg (203 lb 14.8 oz) (12/06 0007)  PHYSICAL EXAMINATION: Gen. Pleasant, well-nourished, in no distress, normal affect, oob to chair on RA ENT - no lesions, no post nasal drip Neck: No JVD, no thyromegaly, no carotid bruits Lungs: no use of accessory muscles, no dullness to percussion, clear without rales or rhonchi  Cardiovascular: Rhythm regular, heart sounds  normal, no murmurs, 2+ peripheral edema Abdomen: soft and non-tender, no hepatosplenomegaly, BS  normal. Musculoskeletal: No deformities, no cyanosis or clubbing Neuro:  alert, non focal Skin:  Warm, no lesions/ rash   Lab 12/08/11 0525 12/07/11 0440 12/06/11 0715  NA 134* 132* 130*  K 4.9 4.2 4.0  CL 93* 93* 92*  CO2 31 32 31  BUN 10 10 11   CREATININE 1.15* 1.17* 1.00  GLUCOSE 91 49* 83    Lab 12/07/11 0440 12/05/11 1949 12/05/11 1146  HGB 8.8* 9.3* 9.9*  HCT 26.6* 27.5* 29.2*  WBC 4.5 4.5 5.2  PLT 354 326 PLATELET CLUMPS NOTED ON SMEAR, COUNT APPEARS ADEQUATE   No results found.  ASSESSMENT / PLAN:  Chronic bronchitis/ smoker - Does not seem to require antibiotics. Ct dulera & singulair for now - presumed asthma (although dulera does not seem to be working) Note that lung function was nml Adm CXR & high BNP c/w heart failure. Reviewed abd CT from 5/13   - does not appear to have ILD. Has tolerated coreg - so ok Smoking cessation paramount here, this was emphasized to her as the most important intervention She will FU with Dr Craige Cotta to decide if dulera can be stopped, ct ventolin prn mucinex 600 bid x 5ds  OSA - ct cpap 10 cm during hospital stay & after discharge  Pl call for any questions  Pulmonary and Critical Care Medicine [facility] Pager: [phone]  12/08/2011, 10:09 AM

## 2011-12-08 NOTE — Progress Notes (Signed)
Pt had 5 beat run of VT. NP aware

## 2011-12-09 DIAGNOSIS — I472 Ventricular tachycardia: Secondary | ICD-10-CM

## 2011-12-09 LAB — BASIC METABOLIC PANEL
CO2: 31 mEq/L (ref 19–32)
Chloride: 94 mEq/L — ABNORMAL LOW (ref 96–112)
GFR calc non Af Amer: 46 mL/min — ABNORMAL LOW (ref >90)
Glucose, Bld: 69 mg/dL — ABNORMAL LOW (ref 70–99)
Potassium: 4.8 mEq/L (ref 3.5–5.1)
Sodium: 131 mEq/L — ABNORMAL LOW (ref 135–145)

## 2011-12-09 LAB — GLUCOSE, CAPILLARY
Glucose-Capillary: 103 mg/dL — ABNORMAL HIGH (ref 70–99)
Glucose-Capillary: 114 mg/dL — ABNORMAL HIGH (ref 70–99)

## 2011-12-09 NOTE — Progress Notes (Signed)
Pt called and c/o sharp pain under left breast, sudden in onset. Ekg done, bp133/76 hr77. O2 placed on pt. Ntg slgiven at 1604. Pt up to bsc and burped with relief. D Dunn PA aware

## 2011-12-09 NOTE — Progress Notes (Signed)
Resp Care Note: Pt refusing to wear CPAP tonight. 

## 2011-12-09 NOTE — Progress Notes (Signed)
Called re: CP. Patient had brief CP, relieved with 1 SL NTG and passing gas. Feels much better now. VSS. EKG shows TW inv V3-V6. Similar to prior tracing, but more pronounced V3-V4. D/w Dr. Myrtis Ser -- will continue to monitor. No other acute intervention for now. Desi Carby PA-C

## 2011-12-09 NOTE — Progress Notes (Signed)
Patient ID: [name], female   DOB: Oct 07, 1945, 66 y.o.   MRN: [id number]   SUBJECTIVE   Patient is improving. However she still has PND and orthopnea. She is sitting in a reclining chair with her feet elevated at this time. She asked appropriate questions and I repeated to her very carefully that she needs to limit her salt intake and limit her fluid intake. We discussed this at length. Other family members were in the room.   Filed Vitals:   12/08/11 2128 12/09/11 0027 12/09/11 0418 12/09/11 0746  BP: 134/68  137/74   Pulse: 56 64 55   Temp: 98 F (36.7 C)  98.3 F (36.8 C)   TempSrc: Oral  Oral   Resp: 18 18 18    Height:      Weight:   198 lb 13.7 oz (90.2 kg)   SpO2: 100%  100% 97%    Intake/Output Summary (Last 24 hours) at 12/09/11 1152 Last data filed at 12/09/11 1131  Gross per 24 hour  Intake   1280 ml  Output   1850 ml  Net   -570 ml    LABS: Basic Metabolic Panel:  Basename 12/09/11 0520 12/08/11 0525  NA 131* 134*  K 4.8 4.9  CL 94* 93*  CO2 31 31  GLUCOSE 69* 91  BUN 10 10  CREATININE 1.20* 1.15*  CALCIUM 8.7 8.9  MG -- --  PHOS -- --   Liver Function Tests: No results found for this basename: AST:2,ALT:2,ALKPHOS:2,BILITOT:2,PROT:2,ALBUMIN:2 in the last 72 hours No results found for this basename: LIPASE:2,AMYLASE:2 in the last 72 hours CBC:  Basename 12/07/11 0440  WBC 4.5  NEUTROABS --  HGB 8.8*  HCT 26.6*  MCV 84.2  PLT 354   Cardiac Enzymes: No results found for this basename: CKTOTAL:3,CKMB:3,CKMBINDEX:3,TROPONINI:3 in the last 72 hours BNP: No components found with this basename: POCBNP:3 D-Dimer: No results found for this basename: DDIMER:2 in the last 72 hours Hemoglobin A1C: No results found for this basename: HGBA1C in the last 72 hours Fasting Lipid Panel:  Basename 12/07/11 0440  CHOL 120  HDL 27*  LDLCALC 76  TRIG 83  CHOLHDL 4.4  LDLDIRECT --   Thyroid Function Tests: No results found for this basename:  TSH,T4TOTAL,FREET3,T3FREE,THYROIDAB in the last 72 hours  RADIOLOGY: Dg Chest 2 View  12/05/2011  *RADIOLOGY REPORT*  Clinical Data: Shortness of breath, cough.  CHEST - 2 VIEW  Comparison: 05/01/2011  Findings: Cardiomegaly with vascular congestion.  Diffuse interstitial prominence is stable, likely mild chronic interstitial lung disease.  Stable elevation of the right hemidiaphragm. Blunting of the right costophrenic angle compatible with small effusion.  No confluent airspace opacities.  IMPRESSION: Cardiomegaly, vascular congestion.  Stable chronic interstitial prominence.  Suspect small right pleural effusion.   Original Report Authenticated By: Charlett Nose, M.D.     PHYSICAL EXAM   Patient is oriented to person time and place. Affect is normal. She's sitting in a recliner with her feet up. There is no jugulovenous distention. She does have basilar rales. Cardiac exam reveals S1 and S2. There no clicks or significant murmurs. Abdomen is soft. She has 2+ peripheral edema bilaterally.   TELEMETRY: I have reviewed telemetry today December 09, 2011. There is sinus rhythm. She does have 5 beats of ventricular tachycardia on one occasion.  ASSESSMENT AND PLAN:   *Acute on chronic systolic CHF (congestive heart failure), NYHA class 4    There is continued slow improvement. Continue IV diuretics.  Ventricular tachycardia  Potassium is normal. I will check magnesium  [name] 12/09/2011 11:52 AM

## 2011-12-10 DIAGNOSIS — D509 Iron deficiency anemia, unspecified: Secondary | ICD-10-CM

## 2011-12-10 DIAGNOSIS — I10 Essential (primary) hypertension: Secondary | ICD-10-CM

## 2011-12-10 LAB — GLUCOSE, CAPILLARY
Glucose-Capillary: 122 mg/dL — ABNORMAL HIGH (ref 70–99)
Glucose-Capillary: 142 mg/dL — ABNORMAL HIGH (ref 70–99)

## 2011-12-10 LAB — BASIC METABOLIC PANEL
Calcium: 8.9 mg/dL (ref 8.4–10.5)
Creatinine, Ser: 1.16 mg/dL — ABNORMAL HIGH (ref 0.50–1.10)
GFR calc Af Amer: 55 mL/min — ABNORMAL LOW (ref >90)
GFR calc non Af Amer: 48 mL/min — ABNORMAL LOW (ref >90)

## 2011-12-10 MED ORDER — ALUM & MAG HYDROXIDE-SIMETH 200-200-20 MG/5ML PO SUSP
30.0000 mL | ORAL | Status: DC | PRN
  Administered 2011-12-10: 30 mL via ORAL
  Filled 2011-12-10: qty 30

## 2011-12-10 MED ORDER — ALBUTEROL SULFATE (5 MG/ML) 0.5% IN NEBU: 2.5000 mg | INHALATION_SOLUTION | RESPIRATORY_TRACT | Status: DC | PRN

## 2011-12-10 MED ORDER — ALBUTEROL SULFATE (5 MG/ML) 0.5% IN NEBU
2.5000 mg | INHALATION_SOLUTION | Freq: Three times a day (TID) | RESPIRATORY_TRACT | Status: DC
  Administered 2011-12-11: 2.5 mg via RESPIRATORY_TRACT
  Filled 2011-12-10 (×2): qty 0.5

## 2011-12-10 NOTE — Progress Notes (Signed)
   TELEMETRY: Reviewed telemetry pt in NSR: Filed Vitals:   12/09/11 2003 12/09/11 2056 12/10/11 0623 12/10/11 0725  BP: 141/89 152/79 151/74   Pulse: 63 70 73   Temp:  98.3 F (36.8 C) 97.1 F (36.2 C)   TempSrc:  Oral Oral   Resp:  20 18   Height:      Weight:   87.7 kg (193 lb 5.5 oz)   SpO2:  99% 100% 100%    Intake/Output Summary (Last 24 hours) at 12/10/11 0935 Last data filed at 12/10/11 0510  Gross per 24 hour  Intake    840 ml  Output   3450 ml  Net  -2610 ml    SUBJECTIVE Still feels SOB but better than admission. Nonproductive cough.  LABS: Basic Metabolic Panel:  Basename 12/10/11 0525 12/09/11 0520  NA 133* 131*  K 4.3 4.8  CL 92* 94*  CO2 30 31  GLUCOSE 121* 69*  BUN 10 10  CREATININE 1.16* 1.20*  CALCIUM 8.9 8.7  MG 1.7 --  PHOS -- --    Radiology/Studies:  Dg Chest 2 View  12/05/2011  *RADIOLOGY REPORT*  Clinical Data: Shortness of breath, cough.  CHEST - 2 VIEW  Comparison: 05/01/2011  Findings: Cardiomegaly with vascular congestion.  Diffuse interstitial prominence is stable, likely mild chronic interstitial lung disease.  Stable elevation of the right hemidiaphragm. Blunting of the right costophrenic angle compatible with small effusion.  No confluent airspace opacities.  IMPRESSION: Cardiomegaly, vascular congestion.  Stable chronic interstitial prominence.  Suspect small right pleural effusion.   Original Report Authenticated By: Charlett Nose, M.D.     PHYSICAL EXAM General: Well developed, well nourished, in no acute distress. Head: Normal Neck: Negative for carotid bruits. JVD not elevated. Lungs: Bilateral coarse rhonchi and expiratory wheezes. Heart: RRR S1 S2 without murmurs, rubs, or gallops.  Abdomen: Soft, non-tender, non-distended with normoactive bowel sounds. No hepatomegaly. No rebound/guarding. No obvious abdominal masses. Msk:  Strength and tone appears normal for age. Extremities: 1+ edema.  Distal pedal pulses are 2+ and  equal bilaterally. Neuro: Alert and oriented X 3. Moves all extremities spontaneously. Psych:  Responds to questions appropriately with a normal affect.  ASSESSMENT AND PLAN: 1. Acute on chronic systolic CHF. Diuresing well. Weight down 6 lbs since admission but still volume overloaded. Continue IV lasix today. May be able to switch to po tomorrow. 2. Asthma- per pulmonary. 3. HTN  Principal Problem:  *Acute on chronic systolic CHF (congestive heart failure), NYHA class 4 Active Problems:  Type II or unspecified type diabetes mellitus without mention of complication, uncontrolled  DYSLIPIDEMIA  HYPERTENSION  Iron deficiency anemia  Constipation  Hyponatremia  Acute on chronic combined systolic and diastolic heart failure  Ventricular tachycardia    Signed, Estellar Cadena [place] MD,FACC 12/10/2011 9:39 AM

## 2011-12-10 NOTE — Progress Notes (Signed)
Patient continues to refuse CPAP therapy. 

## 2011-12-11 ENCOUNTER — Encounter (HOSPITAL_COMMUNITY): Payer: Self-pay | Admitting: Cardiology

## 2011-12-11 LAB — CBC
Hemoglobin: 9.7 g/dL — ABNORMAL LOW (ref 12.0–15.0)
MCH: 27.2 pg (ref 26.0–34.0)
MCV: 84 fL (ref 78.0–100.0)
RBC: 3.57 MIL/uL — ABNORMAL LOW (ref 3.87–5.11)

## 2011-12-11 LAB — GLUCOSE, CAPILLARY

## 2011-12-11 MED ORDER — POTASSIUM CHLORIDE 10 MEQ PO CPCR: 20.0000 meq | ORAL_CAPSULE | Freq: Two times a day (BID) | ORAL | Status: DC

## 2011-12-11 MED ORDER — FUROSEMIDE 80 MG PO TABS: 80.0000 mg | ORAL_TABLET | Freq: Two times a day (BID) | ORAL | Status: DC

## 2011-12-11 MED ORDER — HYDRALAZINE HCL 25 MG PO TABS: 25.0000 mg | ORAL_TABLET | Freq: Three times a day (TID) | ORAL | Status: DC

## 2011-12-11 MED ORDER — GUAIFENESIN ER 600 MG PO TB12: 600.0000 mg | ORAL_TABLET | Freq: Two times a day (BID) | ORAL | Status: AC

## 2011-12-11 MED ORDER — FUROSEMIDE 80 MG PO TABS
80.0000 mg | ORAL_TABLET | Freq: Two times a day (BID) | ORAL | Status: DC
  Administered 2011-12-11: 80 mg via ORAL
  Filled 2011-12-11 (×3): qty 1

## 2011-12-11 MED ORDER — HYDRALAZINE HCL 25 MG PO TABS
25.0000 mg | ORAL_TABLET | Freq: Three times a day (TID) | ORAL | Status: DC
  Filled 2011-12-11 (×3): qty 1

## 2011-12-11 NOTE — Progress Notes (Signed)
Pt ambulated to desk and back on RA with husband and cane.  Sats 92-94%, gait slightly unsteady, no SOB noted.  Back to room, sats maintained 96% on RA while sitting on side of bed.  Will continue to monitor closely. Ave Filter

## 2011-12-11 NOTE — Discharge Summary (Signed)
Discharge Summary   Patient ID: [name] MRN: [id number], DOB/AGE: 07-Nov-1945 66 y.o.  Primary MD: [name], MD Primary Cardiologist: [name] MD Admit date: 12/05/2011 D/C date:     12/11/2011      Primary Discharge Diagnoses:  1. Acute on chronic combined systolic and diastolic heart failure  - Dc'd on Lasix 80mg  BID  - Weight 184lbs  - F/u BMET  2. Hypertension  - Meds changed as below  3. Hyponatremia  - Improving, Na+133 at DC  - F/u BMET  4. Normocytic Anemia  - No evidence of active bleeding, Hgb 9.7 at DC  - F/u CBC  5. Tobacco Abuse  Secondary Discharge Diagnoses:  . DM   . DYSLIPIDEMIA   . OBESITY NOS   . DEPRESSION, RECURRENT, IN PARTIAL REMISSION   . HYPERTENSION   . 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 CONGESTIVE HEART FAILURE     EF 40-45% echo 05/2011  . GERD   . DIVERTICULOSIS, COLON, WITH HEMORRHAGE   . BACK PAIN, LUMBAR, WITH RADICULOPATHY   . OBSTRUCTIVE SLEEP APNEA   . Asthma      Allergies Allergies  Allergen Reactions  . Amlodipine Other (See Comments)    constipation  . Ramipril Cough  . Adhesive (Tape) Hives    "peels skin off"  . Penicillins Itching  . Codeine Rash    Diagnostic Studies/Procedures:  None   History of Present Illness: 66 y.o. female w/ the above medical problems who presented to [facility] on 12/05/11 with complaints of shortness of breath. She was visiting family in [place] during which time she had increased sodium consumption with subsequent dyspnea prompting her to seek medical attention. She was hospitalized in [place] where she was diuresed, however, she left AMA, and due to continued dyspnea presented to the [facility] ED.   Hospital Course: In the ED she was volume overloaded on exam. EKG revealed sinus rhythm, first degree AV block,  prior inferior infarct and prior anterior infarct cannot be excluded. CXR showed vascular congestion and possible small right effusion. Labs were significant for normal troponin, pBNP 28,602, Na 125, Hgb 9.9. She was placed on IV lasix and admitted for further evaluation and treatment. Cardiac enzymes were cycled and remained negative. She diuresed well with improvement in volume status and respiratory status. Renal function and electrolytes were monitored. Lasix was transitioned to oral dosing. Weight 199-->184lbs. Pulmonary evaluated her and recommended smoking cessation, continuing Singulair & prn ventolin, and follow up with Dr. Craige Cotta to discuss possible discontinuation of Dulera. It was also recommended she continue nocturnal CPAP, however, it was noted she refused this on multiple occasions. Hydralazine was added for better BP control. Na+ improved and Hgb stayed stable without signs of active bleeding. This will be monitored as an outpatient. She was able to ambulate with minimal shortness of breath and O2 sats >90% on room air. She was seen and evaluated by [name] who felt she was stable for discharge home with plans for follow up as scheduled below.  Discharge Vitals: Blood pressure 175/81, pulse 81, temperature 98.7 F (37.1 C), temperature source Oral, resp. rate 18, height 5\' 9"  (1.753 m), weight 184 lb 1.6 oz (83.507 kg), SpO2 97.00%.  Labs: Component Value Date   WBC 5.1 12/11/2011   HGB 9.7*  12/11/2011   HCT 30.0* 12/11/2011   MCV 84.0 12/11/2011   PLT 357 12/11/2011    Lab 12/10/11 0525 12/06/11 0715  NA 133* --  K 4.3 --  CL 92* --  CO2 30 --  BUN 10 --  CREATININE 1.16* --  CALCIUM 8.9 --  PROT -- 5.7*  BILITOT -- 0.5  ALKPHOS -- 80  ALT -- 11  AST -- 19  GLUCOSE 121* --   Component Value Date   CHOL 120 12/07/2011   HDL 27* 12/07/2011   LDLCALC 76 12/07/2011   TRIG 83 12/07/2011     12/05/2011 19:49 12/06/2011 01:19 12/06/2011 07:15  Troponin I <0.30 <0.30 <0.30     12/05/2011 11:47  Pro B Natriuretic peptide (BNP) 28602.0 (H)     12/05/2011 19:49  TSH 3.650     12/05/2011 17:24  Hemoglobin A1C 7.4 (H)    Discharge Medications     Medication List     As of 12/11/2011  1:24 PM    TAKE these medications         albuterol 108 (90 BASE) MCG/ACT inhaler   Commonly known as: PROVENTIL HFA;VENTOLIN HFA   Inhale 2 puffs into the lungs every 6 (six) hours as needed. For shortness of breath      aspirin 81 MG tablet   Take 81 mg by mouth daily.      atorvastatin 40 MG tablet   Commonly known as: LIPITOR   Take 1 tablet (40 mg total) by mouth daily.      bisacodyl 5 MG EC tablet   Commonly known as: DULCOLAX   Take 10 mg by mouth at bedtime as needed. For constipation      CALCIUM-VITAMIN D PO   Take 1 tablet by mouth daily.      carvedilol 25 MG tablet   Commonly known as: COREG   Take 37.5 mg by mouth 2 (two) times daily with a meal. Take one and one half tablet twice daily. PT WANTS BRAND ONLY      EFFEXOR XR 150 MG 24 hr capsule   Generic drug: venlafaxine XR   Take 150 mg by mouth 2 (two) times daily.      furosemide 80 MG tablet   Commonly known as: LASIX   Take 1 tablet (80 mg total) by mouth 2 (two) times daily.      Gabapentin (PHN) 300 MG Tabs   Take 1 tablet by mouth 3 (three) times daily.      hydrALAZINE 25 MG tablet   Commonly known as: APRESOLINE   Take 1 tablet (25 mg total) by mouth every 8 (eight) hours.      insulin aspart 100 UNIT/ML injection   Commonly known as: novoLOG   Inject 10-15 Units into the skin 3 (three) times daily before meals. Sliding scale.    15 units at breakfast, 10units at lunch,10 units at dinner      insulin glargine 100 UNIT/ML injection   Commonly known as: LANTUS   Inject 10-20 Units into the skin at bedtime. Per sliding scale      meloxicam 7.5 MG tablet   Commonly known as: MOBIC   Take 7.5 mg by mouth 2 (two) times daily.      methocarbamol 500 MG tablet   Commonly known as:  ROBAXIN   Take 500 mg by mouth 3 (three) times daily.      MIRALAX PO   Take 17 g by mouth daily. As directed  mometasone-formoterol 100-5 MCG/ACT Aero   Commonly known as: DULERA   Inhale 2 puffs into the lungs 2 (two) times daily.      montelukast 10 MG tablet   Commonly known as: SINGULAIR   Take 10 mg by mouth at bedtime.      olmesartan 40 MG tablet   Commonly known as: BENICAR   Take 40 mg by mouth daily.      pantoprazole 40 MG tablet   Commonly known as: PROTONIX   Take 40 mg by mouth 2 (two) times daily.      potassium chloride 10 MEQ CR capsule   Commonly known as: MICRO-K   Take 2 capsules (20 mEq total) by mouth 2 (two) times daily.      sitaGLIPtan-metformin 50-500 MG per tablet   Commonly known as: JANUMET   Take 1 tablet by mouth 2 (two) times daily with a meal.      traZODone 100 MG tablet   Commonly known as: DESYREL   Take 150 mg by mouth at bedtime. 1 and 1/2 pills at hs      TYLENOL ARTHRITIS PAIN 650 MG CR tablet   Generic drug: acetaminophen   Take 1,300 mg by mouth every 8 (eight) hours as needed. For pain          Disposition   Discharge Orders    Future Appointments: Provider: Department: Dept Phone: Center:   12/13/2011 9:25 AM [facility] Main Office [place]) [phone] [facility]   12/15/2011 2:00 PM [name], MD [facility] Main Office [place]) [phone] [facility]   12/18/2011 10:30 AM Su Monks, PA-C [facility] [phone] [facility]   12/19/2011 9:15 AM [name], MD [facility] Main Office [place]) [phone] [facility]   12/28/2011 1:30 PM [name], MD [facility] [phone] None     Future Orders Please Complete By Expires   Diet - low sodium heart healthy      Increase activity slowly      Discharge instructions      Comments:   * Please stop smoking!     Follow-up Information    Follow up with  SOOD,VINEET, MD. On 12/28/2011. (1.30 pm)    Contact information:   [address]  [facility] [place] [address] [phone]       Follow up with  [facility]. On 12/13/2011. (Labs (BMET, CBC). Anytime between 7:30-4:30)    Contact information:   [address] [place] [address] [phone]      Follow up with [name], MD. On 12/15/2011. (2:00)    Contact information:   [address] [place] [address] [phone]           Outstanding Labs/Studies:  1. BMET & CBC  Duration of Discharge Encounter: Greater than 30 minutes including physician and PA time.  Signed, Shley Dolby PA-C 12/11/2011, 1:24 PM

## 2011-12-11 NOTE — Progress Notes (Signed)
Pt ambulated on RA for 50 ft as tolerated.  Sats 90-95% fluctuating, pt slightly SOB, gait unsteady with cane due to arthritis/neuropathy per pt.  Back to chair in room, sats 95% sustained on RA.  Will continue to monitor closely. [facility]

## 2011-12-11 NOTE — Discharge Summary (Signed)
Patient seen and examined.  Plan as discussed in my rounding note for today and outlined above. [name]  12/11/2011  8:35 PM

## 2011-12-11 NOTE — Progress Notes (Signed)
    SUBJECTIVE:  The patient continues to use PRN O2.  She has a cough non productive.   PHYSICAL EXAM Filed Vitals:   12/10/11 1404 12/10/11 1946 12/10/11 2109 12/11/11 0452  BP: 143/94  146/75 175/81  Pulse: 69  74 81  Temp: 97.4 F (36.3 C)  98.4 F (36.9 C) 98.7 F (37.1 C)  TempSrc: Oral  Oral Oral  Resp: 18  18 18   Height:      Weight:    184 lb 1.6 oz (83.507 kg)  SpO2: 100% 98% 92% 95%   General:  No acute distresss Neck:  No JVD Lungs:  Diffuse mild expiratory wheezing improved Heart:  RRR Abdomen:  Positive bowel sounds, no rebound no guarding Extremities:  Mild edema   LABS:  Results for orders placed during the hospital encounter of 12/05/11 (from the past 24 hour(s))  GLUCOSE, CAPILLARY     Status: Abnormal   Collection Time   12/10/11 11:52 AM      Component Value Range   Glucose-Capillary 141 (*) 70 - 99 mg/dL   Comment 1 Documented in Chart     Comment 2 Notify RN    GLUCOSE, CAPILLARY     Status: Abnormal   Collection Time   12/10/11  4:42 PM      Component Value Range   Glucose-Capillary 122 (*) 70 - 99 mg/dL   Comment 1 Notify RN     Comment 2 Documented in Chart    GLUCOSE, CAPILLARY     Status: Abnormal   Collection Time   12/10/11  9:12 PM      Component Value Range   Glucose-Capillary 142 (*) 70 - 99 mg/dL   Comment 1 Notify RN     Comment 2 Documented in Chart    GLUCOSE, CAPILLARY     Status: Abnormal   Collection Time   12/11/11  6:23 AM      Component Value Range   Glucose-Capillary 124 (*) 70 - 99 mg/dL   Comment 1 Notify RN     Comment 2 Documented in Chart      Intake/Output Summary (Last 24 hours) at 12/11/11 0649 Last data filed at 12/11/11 1610  Gross per 24 hour  Intake    720 ml  Output   4650 ml  Net  -3930 ml    ASSESSMENT AND PLAN:  Acute on chronic combined systolic and diastolic heart failure: Weight is down 15 lbs since admission.  Down 11 liters.  I will change to oral Lasix today.  She will ambulate off O2  to records sats.  I will check a BMET.  Possibly home later today with close follow up as an outpatient.    Type II or unspecified type diabetes mellitus without mention of complication, uncontrolled: A1C is 7.4.  She will continue the current medications.   DYSLIPIDEMIA LDL was 76.  She will continue the meds as listed.   HYPERTENSION Hydralazine added.  I will increase this today.    Hyponatremia Improved.  I will follow as an out patient.    Anemia Hgb is falling. Guaiac was never done.   Repeat CBC today.   Tobacco abuse Continue education.   Disposition Possibly home later today.  She would need labs mid week in the office and a transition of care appt for Friday of this week.   [name] [facility] 12/11/2011 6:49 AM

## 2011-12-13 ENCOUNTER — Other Ambulatory Visit (INDEPENDENT_AMBULATORY_CARE_PROVIDER_SITE_OTHER): Payer: Medicare Other

## 2011-12-13 ENCOUNTER — Other Ambulatory Visit: Payer: Self-pay | Admitting: *Deleted

## 2011-12-13 DIAGNOSIS — D509 Iron deficiency anemia, unspecified: Secondary | ICD-10-CM

## 2011-12-13 DIAGNOSIS — I5043 Acute on chronic combined systolic (congestive) and diastolic (congestive) heart failure: Secondary | ICD-10-CM

## 2011-12-13 LAB — BASIC METABOLIC PANEL
BUN: 11 mg/dL (ref 6–23)
CO2: 34 mEq/L — ABNORMAL HIGH (ref 19–32)
Calcium: 8.3 mg/dL — ABNORMAL LOW (ref 8.4–10.5)
Chloride: 94 mEq/L — ABNORMAL LOW (ref 96–112)
Creatinine, Ser: 1.3 mg/dL — ABNORMAL HIGH (ref 0.4–1.2)

## 2011-12-13 LAB — CBC WITH DIFFERENTIAL/PLATELET
Basophils Absolute: 0 10*3/uL (ref 0.0–0.1)
Basophils Relative: 0.4 % (ref 0.0–3.0)
HCT: 29 % — ABNORMAL LOW (ref 36.0–46.0)
Hemoglobin: 9.5 g/dL — ABNORMAL LOW (ref 12.0–15.0)
Lymphs Abs: 1 10*3/uL (ref 0.7–4.0)
MCHC: 32.6 g/dL (ref 30.0–36.0)
Monocytes Relative: 7.4 % (ref 3.0–12.0)
Neutro Abs: 2.9 10*3/uL (ref 1.4–7.7)
RBC: 3.4 Mil/uL — ABNORMAL LOW (ref 3.87–5.11)
RDW: 19.6 % — ABNORMAL HIGH (ref 11.5–14.6)

## 2011-12-15 ENCOUNTER — Ambulatory Visit (INDEPENDENT_AMBULATORY_CARE_PROVIDER_SITE_OTHER): Payer: Medicare Other | Admitting: Cardiology

## 2011-12-15 ENCOUNTER — Encounter: Payer: Self-pay | Admitting: Cardiology

## 2011-12-15 VITALS — BP 150/98 | HR 73 | Ht 70.0 in | Wt 185.0 lb

## 2011-12-15 DIAGNOSIS — I10 Essential (primary) hypertension: Secondary | ICD-10-CM

## 2011-12-15 DIAGNOSIS — I509 Heart failure, unspecified: Secondary | ICD-10-CM

## 2011-12-15 DIAGNOSIS — I5043 Acute on chronic combined systolic (congestive) and diastolic (congestive) heart failure: Secondary | ICD-10-CM

## 2011-12-15 DIAGNOSIS — I2510 Atherosclerotic heart disease of native coronary artery without angina pectoris: Secondary | ICD-10-CM

## 2011-12-15 DIAGNOSIS — J4530 Mild persistent asthma, uncomplicated: Secondary | ICD-10-CM

## 2011-12-15 DIAGNOSIS — J45909 Unspecified asthma, uncomplicated: Secondary | ICD-10-CM

## 2011-12-15 MED ORDER — VARENICLINE TARTRATE 1 MG PO TABS: 1.0000 mg | ORAL_TABLET | Freq: Two times a day (BID) | ORAL | Status: DC

## 2011-12-15 MED ORDER — VARENICLINE TARTRATE 0.5 MG X 11 & 1 MG X 42 PO MISC: ORAL | Status: DC

## 2011-12-15 MED ORDER — OLMESARTAN MEDOXOMIL 40 MG PO TABS: 40.0000 mg | ORAL_TABLET | Freq: Every day | ORAL | Status: DC

## 2011-12-15 NOTE — Progress Notes (Signed)
Hospital follow up. DC12/9/13 for dyspnea, combined CHF exacerbation.  Since discharge has improved respiratory function. Admits exacerbation was at least partially secondary to dietary noncompliance. Can walk about 10 minutes before dyspnea limits her. Compliant with lasix 80 mg BID. In hospital had elevated BP and started on hydralazine which she manages to take twice daily instead of q8hrs. She reports slight improvement in leg swelling but still causes some pain/discomfort. She is falling recently and yesterday landed on her left knee. Plans to see her PCP about this.   She has plans to f/u with Dr. Craige Cotta for asthma. Wants to quit smoking again, which she resumed 6 months ago due to stress. Previously successful with chantix for 3 years.   Allergies  Allergen Reactions  . Amlodipine Other (See Comments)    constipation  . Ramipril Cough  . Adhesive (Tape) Hives    "peels skin off"  . Penicillins Itching  . Codeine Rash    Current Outpatient Prescriptions  Medication Sig Dispense Refill  . acetaminophen (TYLENOL ARTHRITIS PAIN) 650 MG CR tablet Take 1,300 mg by mouth every 8 (eight) hours as needed. For pain      . albuterol (PROVENTIL HFA;VENTOLIN HFA) 108 (90 BASE) MCG/ACT inhaler Inhale 2 puffs into the lungs every 6 (six) hours as needed. For shortness of breath      . aspirin 81 MG tablet Take 81 mg by mouth daily.        [name] atorvastatin (LIPITOR) 40 MG tablet Take 1 tablet (40 mg total) by mouth daily.  90 tablet  3  . bisacodyl (DULCOLAX) 5 MG EC tablet Take 10 mg by mouth at bedtime as needed. For constipation      . CALCIUM-VITAMIN D PO Take 1 tablet by mouth daily.      . carvedilol (COREG) 25 MG tablet Take 37.5 mg by mouth 2 (two) times daily with a meal. Take one and one half tablet twice daily. PT WANTS BRAND ONLY      . furosemide (LASIX) 80 MG tablet Take 1 tablet (80 mg total) by mouth 2 (two) times daily.  60 tablet  6  . Gabapentin, PHN, 300 MG TABS Take 1 tablet by  mouth 3 (three) times daily.      . hydrALAZINE (APRESOLINE) 25 MG tablet Take 1 tablet (25 mg total) by mouth every 8 (eight) hours.  90 tablet  6  . insulin aspart (NOVOLOG) 100 UNIT/ML injection Inject 10-15 Units into the skin 3 (three) times daily before meals. Sliding scale.    15 units at breakfast, 10units at lunch,10 units at dinner      . insulin glargine (LANTUS) 100 UNIT/ML injection Inject 10-20 Units into the skin at bedtime. Per sliding scale      . meloxicam (MOBIC) 7.5 MG tablet Take 7.5 mg by mouth 2 (two) times daily.      . methocarbamol (ROBAXIN) 500 MG tablet Take 500 mg by mouth 3 (three) times daily.      . mometasone-formoterol (DULERA) 100-5 MCG/ACT AERO Inhale 2 puffs into the lungs 2 (two) times daily.      . montelukast (SINGULAIR) 10 MG tablet Take 10 mg by mouth at bedtime.      [name] olmesartan (BENICAR) 40 MG tablet Take 40 mg by mouth daily.      . pantoprazole (PROTONIX) 40 MG tablet Take 40 mg by mouth 2 (two) times daily.      . Polyethylene Glycol 3350 (MIRALAX PO) Take 17 g  by mouth daily. As directed       . potassium chloride (MICRO-K) 10 MEQ CR capsule Take 2 capsules (20 mEq total) by mouth 2 (two) times daily.  120 capsule  6  . sitaGLIPtan-metformin (JANUMET) 50-500 MG per tablet Take 1 tablet by mouth 2 (two) times daily with a meal.      . traZODone (DESYREL) 100 MG tablet Take 150 mg by mouth at bedtime. 1 and 1/2 pills at hs      . venlafaxine (EFFEXOR XR) 150 MG 24 hr capsule Take 150 mg by mouth 2 (two) times daily.        . [DISCONTINUED] budesonide-formoterol (SYMBICORT) 80-4.5 MCG/ACT inhaler Inhale 2 puffs into the lungs 2 (two) times daily.  1 Inhaler  12    Past Medical History  Diagnosis Date  . DM   . DYSLIPIDEMIA   . OBESITY NOS   . DEPRESSION, RECURRENT, IN PARTIAL REMISSION   . HYPERTENSION   . CORONARY ARTERY DISEASE     S/p Q wave MI 2002 with RCA stent and AngioJet   Cath 4/30 99% distal LAD stenosis unchanged from previous, 20%  left main stenosis, patent stents  . CONGESTIVE HEART FAILURE     EF 40-45% echo 05/2011  . GERD   . DIVERTICULOSIS, COLON, WITH HEMORRHAGE   . BACK PAIN, LUMBAR, WITH RADICULOPATHY   . OBSTRUCTIVE SLEEP APNEA   . Asthma   . Anemia, iron deficiency   . Hyponatremia 12/2011  . Tobacco abuse     ROS  As stated in the HPI and negative for all other systems.  PHYSICAL EXAM BP 150/98  Pulse 73  Ht 5\' 10"  (1.778 m)  Wt 185 lb (83.915 kg)  BMI 26.54 kg/m2  SpO2 97% GENERAL:  Well appearing. No distress HEENT:  Pupils equal round and reactive, MMM NECK:  No jugular venous distention, waveform within normal limits, carotid upstroke brisk and symmetric, no bruits, no thyromegaly LYMPHATICS:  No cervical, inguinal adenopathy LUNGS:  Clear to auscultation bilaterally. No wheezing. BACK:  No CVA tenderness HEART:  PMI not displaced or sustained,S1 and S2 within normal limits, no S3, no S4, II/VI systolic apical murmur ABD:  Flat, positive bowel sounds normal in frequency in pitch, no bruits, no rebound, no guarding, no midline pulsatile mass, no hepatomegaly, no splenomegaly, obese EXT:  2 + pulses throughout, no cyanosis no clubbing, 2+ bilateral pitting edema to knee level. No warmth or erythema. Left knee moderate effusion noted. SKIN:  No rashes no nodules NEURO:  Cranial nerves II through XII grossly intact, motor grossly intact throughout PSYCH:  Cognitively intact, oriented to person place and time  ASSESSMENT AND PLAN  HYPERTENSION -  Her blood pressure is elevated again today, though improved from hospital (systolic 170s). She will restart her benicar and advised taking hydralazine q8 hr.   CONGESTIVE HEART FAILURE -  Weight and leg edema are stable. Recommend using compression stockings and elevation instead of increasing diuresis. Continue lasix 80 mg BID. Renal function stable at 1.3 range. Continue benicar and coreg.   DYSPNEA  Multifactorial with asthma, tobacco, chronic  CHF. Symptoms improved with diuresis. No sign of bronchospasm on exam. It's probably in part related to tobacco.  TOBACCO ABUSE she desires cessation so will try chantix again per patient request since she had previous success with this medication.   KNEE PAIN/EFFUSION Patient to f/u with her PCP for further evaluation  ANEMIA Hemoglobin stable at 9.5   History and all  data above reviewed.  Patient examined.  I agree with the findings as above. Since going home she thinks she is breathing better. She does continue to have some lower extremity swelling. She does have some difficulty with balance partly because she has swelling of her left knee. We did check labs and her creatinine is up slightly. Her anemia is stable. The patient exam reveals COR:RRR  ,  Lungs: Clear  ,  Abd: Positive bowel sounds, no rebound no guarding, Ext Mild bilateral  .  All available labs, radiology testing, previous records reviewed. Agree with documented assessment and plan. At this point I do not want to increase her diuretics. She's to keep her feet elevated. I have prescribed knee-high stockings.   [name]  3:01 PM  12/15/2011

## 2011-12-15 NOTE — Patient Instructions (Addendum)
The current medical regimen is effective;  continue present plan and medications.  Please wear knee high compression sockings.  Follow up in 1 month with Tereso Newcomer, PA. On a day when Dr Antoine Poche is here.

## 2011-12-18 ENCOUNTER — Encounter
Payer: Medicare Other | Attending: Physical Medicine and Rehabilitation | Admitting: Physical Medicine and Rehabilitation

## 2011-12-18 ENCOUNTER — Encounter: Payer: Self-pay | Admitting: Physical Medicine and Rehabilitation

## 2011-12-18 VITALS — BP 140/76 | HR 74 | Resp 14 | Ht 70.0 in | Wt 182.0 lb

## 2011-12-18 DIAGNOSIS — M542 Cervicalgia: Secondary | ICD-10-CM

## 2011-12-18 DIAGNOSIS — M47812 Spondylosis without myelopathy or radiculopathy, cervical region: Secondary | ICD-10-CM | POA: Insufficient documentation

## 2011-12-18 DIAGNOSIS — M5030 Other cervical disc degeneration, unspecified cervical region: Secondary | ICD-10-CM | POA: Insufficient documentation

## 2011-12-18 DIAGNOSIS — R209 Unspecified disturbances of skin sensation: Secondary | ICD-10-CM | POA: Insufficient documentation

## 2011-12-18 NOTE — Progress Notes (Signed)
Subjective:    Patient ID: [name], female    DOB: 06/14/1945, 66 y.o.   MRN: [id number]  HPI The patient is a 66 year old female, who presents with neck pain and right arm pain . The symptoms have improved tremendously since her last visit, after increasing her gabapentin slowly to 300mg  tid , and prescribing Robaxin and Mobic. Now the patient complains about mild pain , which radiates into her entire right arm in a non radicular pattern. Patient also complains about numbness and tingling in the same distribution, mainly in her fingers . She describes the pain as throbing . Applying heat, taking medications , changing positions alleviate the symptoms. Prolonged staying in one position aggrevates the symptoms. The patient grades her pain as a 3/10, last visit it was 10/10. Patient has been in the hospital for CHF 3 weeks ago, and is following up with a cardiologist regularly.  Pain Inventory Average Pain 6 Pain Right Now 3 My pain is intermittent, sharp, tingling and aching  In the last 24 hours, has pain interfered with the following? General activity 4 Relation with others 2 Enjoyment of life 4 What TIME of day is your pain at its worst? evening Sleep (in general) Fair  Pain is worse with: walking and some activites Pain improves with: medication and injections Relief from Meds: 8  Mobility use a cane use a walker how many minutes can you walk? 5 ability to climb steps?  no do you drive?  no Do you have any goals in this area?  yes  Function disabled: date disabled 3 I need assistance with the following:  toileting, meal prep, household duties and shopping Do you have any goals in this area?  yes  Neuro/Psych bladder control problems bowel control problems weakness numbness tremor tingling trouble walking dizziness depression anxiety  Prior Studies hospital stay  Physicians involved in your care cardiologist   Family History  Problem Relation  Age of Onset  . Stroke Mother   . Breast cancer Sister   . Emphysema Sister   . Cancer Sister     breast  . Alcohol abuse Brother   . Colon cancer      uncles x 2  . Prostate cancer Brother    History   Social History  . Marital Status: Married    Spouse Name: N/A    Number of Children: 0  . Years of Education: N/A   Occupational History  . Retired Insurance account manager for [employer]    Social History Main Topics  . Smoking status: Current Some Day Smoker -- 1.0 packs/day for 20 years    Types: Cigarettes  . Smokeless tobacco: Never Used     Comment: Still smokes a little.  . Alcohol Use: No  . Drug Use: No  . Sexually Active: Not Currently    Birth Control/ Protection: Surgical   Other Topics Concern  . None   Social History Narrative  . None   Past Surgical History  Procedure Date  . Uvuloplasty   . Total abdominal hysterectomy w/ bilateral salpingoophorectomy   . Abdominal hysterectomy   . Cholecystectomy   . Coronary angioplasty with stent placement 2002   Past Medical History  Diagnosis Date  . DM   . DYSLIPIDEMIA   . OBESITY NOS   . DEPRESSION, RECURRENT, IN PARTIAL REMISSION   . HYPERTENSION   . CORONARY ARTERY DISEASE     S/p Q wave MI 2002 with RCA stent and AngioJet  Cath 4/30 99% distal LAD stenosis unchanged from previous, 20% left main stenosis, patent stents  . CONGESTIVE HEART FAILURE     EF 40-45% echo 05/2011  . GERD   . DIVERTICULOSIS, COLON, WITH HEMORRHAGE   . BACK PAIN, LUMBAR, WITH RADICULOPATHY   . OBSTRUCTIVE SLEEP APNEA   . Asthma   . Anemia, iron deficiency   . Hyponatremia 12/2011  . Tobacco abuse    BP 140/76  Pulse 74  Resp 14  Ht 5\' 10"  (1.778 m)  Wt 182 lb (82.555 kg)  BMI 26.11 kg/m2  SpO2 100%     Review of Systems  Constitutional: Positive for unexpected weight change.  HENT: Positive for neck pain.   Respiratory: Positive for cough and shortness of breath.   Gastrointestinal: Positive for  constipation.  Musculoskeletal: Positive for back pain and gait problem.  Neurological: Positive for dizziness, tremors, weakness and numbness.  Psychiatric/Behavioral: Positive for dysphoric mood. The patient is nervous/anxious.        Objective:   Physical Exam Constitutional: She is oriented to person, place, and time. She appears well-developed and well-nourished.  Walks with a cane  HENT:  Head: Normocephalic.  Neck: Neck supple.  Musculoskeletal: She exhibits tenderness.  Neurological: She is alert and oriented to person, place, and time.  Skin: Skin is warm and dry.  Psychiatric: She has a normal mood and affect.   Symmetric normal motor tone is noted throughout, except increased tone in neck muscles bilateral. Normal muscle bulk. Muscle testing reveals 5/5 muscle strength of the upper extremity, and 5/5 of the lower extremity. Full range of motion in upper and lower extremities. ROM of C-spine is mildly restricted. Fine motor movements are normal in both hands.  Sensory is intact and symmetric to light touch, pinprick and proprioception.  DTR in the upper and lower extremity are present and symmetric 1+. No clonus is noted.  Patient arises from chair with mild difficulty. Narrow based gait with with a cane.         Assessment & Plan:  MRI cervical 03/02/11  1. Cervical spondylosis and degenerative disc disease results and  very degrees of foraminal impingement at all levels between C2 and  C7. Radiating pain and numbness in a non-radicular pattern.  2. Despite efforts by the patient and technologist, motion  artifact is present on some series of today's examination and could  not be totally eliminated. This reduces diagnostic sensitivity and  Specificity.  PLAN  Advised patient to rest her head, also to keep her head in a good posture. Suggested PT for pain relief, patient denied.  Decrease Robaxin 500mg  from tid to prn, decrease Mobic 7.5 to once a day, and continue the  Gabapentin 300mg  tid if tolerated. Patient states, that she can not afford the medication until next Wednesday. Gave patient a Toradol injection at her last visit, which she tolerated well, and gave her good relief.

## 2011-12-18 NOTE — Patient Instructions (Signed)
Try to stay as active as tolerated 

## 2011-12-19 ENCOUNTER — Ambulatory Visit: Payer: Medicare Other | Admitting: Cardiology

## 2011-12-21 ENCOUNTER — Ambulatory Visit: Payer: Medicare Other | Admitting: Cardiology

## 2011-12-22 ENCOUNTER — Ambulatory Visit (INDEPENDENT_AMBULATORY_CARE_PROVIDER_SITE_OTHER): Payer: Medicare Other | Admitting: Internal Medicine

## 2011-12-22 ENCOUNTER — Encounter: Payer: Self-pay | Admitting: Internal Medicine

## 2011-12-22 ENCOUNTER — Other Ambulatory Visit (INDEPENDENT_AMBULATORY_CARE_PROVIDER_SITE_OTHER): Payer: Medicare Other

## 2011-12-22 VITALS — BP 112/64 | HR 80 | Temp 97.0°F | Resp 16 | Wt 179.2 lb

## 2011-12-22 DIAGNOSIS — D509 Iron deficiency anemia, unspecified: Secondary | ICD-10-CM

## 2011-12-22 DIAGNOSIS — R0902 Hypoxemia: Secondary | ICD-10-CM

## 2011-12-22 DIAGNOSIS — J45909 Unspecified asthma, uncomplicated: Secondary | ICD-10-CM

## 2011-12-22 DIAGNOSIS — J4530 Mild persistent asthma, uncomplicated: Secondary | ICD-10-CM

## 2011-12-22 DIAGNOSIS — IMO0001 Reserved for inherently not codable concepts without codable children: Secondary | ICD-10-CM

## 2011-12-22 LAB — CBC WITH DIFFERENTIAL/PLATELET
Basophils Absolute: 0 10*3/uL (ref 0.0–0.1)
Basophils Relative: 0.6 % (ref 0.0–3.0)
Eosinophils Absolute: 0.1 10*3/uL (ref 0.0–0.7)
Lymphocytes Relative: 25.4 % (ref 12.0–46.0)
MCHC: 32.9 g/dL (ref 30.0–36.0)
MCV: 85.2 fl (ref 78.0–100.0)
Monocytes Absolute: 0.3 10*3/uL (ref 0.1–1.0)
Neutrophils Relative %: 66 % (ref 43.0–77.0)
Platelets: 310 10*3/uL (ref 150.0–400.0)
RBC: 3.5 Mil/uL — ABNORMAL LOW (ref 3.87–5.11)
RDW: 19.6 % — ABNORMAL HIGH (ref 11.5–14.6)

## 2011-12-22 LAB — IBC PANEL
Iron: 33 ug/dL — ABNORMAL LOW (ref 42–145)
Transferrin: 223.8 mg/dL (ref 212.0–360.0)

## 2011-12-22 LAB — FERRITIN: Ferritin: 30.6 ng/mL (ref 10.0–291.0)

## 2011-12-22 MED ORDER — FERROUS FUMARATE 325 (106 FE) MG PO TABS: 1.0000 | ORAL_TABLET | Freq: Two times a day (BID) | ORAL | Status: DC

## 2011-12-22 NOTE — Progress Notes (Signed)
Subjective:    Patient ID: [name], female    DOB: Sep 02, 1945, 66 y.o.   MRN: [id number]  Anemia Presents for follow-up visit. Symptoms include light-headedness, malaise/fatigue and pallor. There has been no abdominal pain, anorexia, bruising/bleeding easily, confusion, fever, leg swelling, palpitations, paresthesias, pica or weight loss. Signs of blood loss that are not present include hematemesis, hematochezia, melena, menorrhagia and vaginal bleeding. Compliance problems include psychosocial issues.  Compliance with medications is 0-25%.      Review of Systems  Constitutional: Positive for malaise/fatigue and fatigue. Negative for fever, chills, weight loss, diaphoresis, activity change, appetite change and unexpected weight change.  HENT: Negative.   Eyes: Negative.   Respiratory: Positive for shortness of breath and wheezing. Negative for apnea, cough, choking, chest tightness and stridor.   Cardiovascular: Positive for leg swelling. Negative for chest pain and palpitations.  Gastrointestinal: Negative for nausea, vomiting, abdominal pain, diarrhea, constipation, blood in stool, melena, hematochezia, abdominal distention, anal bleeding, rectal pain, anorexia and hematemesis.  Genitourinary: Negative.  Negative for vaginal bleeding and menorrhagia.  Musculoskeletal: Negative.   Skin: Positive for pallor. Negative for color change, rash and wound.  Neurological: Positive for light-headedness. Negative for dizziness, tremors, seizures, syncope, facial asymmetry, speech difficulty, weakness, numbness, headaches and paresthesias.  Hematological: Negative for adenopathy. Does not bruise/bleed easily.  Psychiatric/Behavioral: Negative.  Negative for confusion.       Objective:   Physical Exam  Constitutional: She is oriented to person, place, and time. She appears well-developed and well-nourished. No distress.  HENT:  Head: Normocephalic and atraumatic. No trismus in the jaw.   Mouth/Throat: Mucous membranes are pale, not dry and not cyanotic. No oral lesions. No uvula swelling. No oropharyngeal exudate, posterior oropharyngeal edema, posterior oropharyngeal erythema or tonsillar abscesses.  Eyes: Conjunctivae normal are normal. Right eye exhibits no discharge. Left eye exhibits no discharge. No scleral icterus.  Neck: Normal range of motion. Neck supple. No JVD present. No tracheal deviation present. No thyromegaly present.  Cardiovascular: Normal rate, regular rhythm, normal heart sounds and intact distal pulses.  Exam reveals no gallop and no friction rub.   No murmur heard. Pulmonary/Chest: Breath sounds normal. Accessory muscle usage present. No stridor. Tachypnea noted. No respiratory distress. She has no decreased breath sounds. She has no wheezes. She has no rhonchi. She has no rales. She exhibits no tenderness.  Abdominal: Soft. Bowel sounds are normal. She exhibits no distension and no mass. There is no tenderness. There is no rebound and no guarding.  Musculoskeletal: Normal range of motion. She exhibits edema (1+ edema in BLE). She exhibits no tenderness.  Lymphadenopathy:    She has no cervical adenopathy.  Neurological: She is oriented to person, place, and time.  Skin: Skin is warm and dry. No rash noted. She is not diaphoretic. No erythema. No pallor.  Psychiatric: She has a normal mood and affect. Her behavior is normal. Judgment and thought content normal.      Lab Results  Component Value Date   WBC 4.4* 12/13/2011   HGB 9.5* 12/13/2011   HCT 29.0* 12/13/2011   PLT 376.0 12/13/2011   GLUCOSE 226* 12/13/2011   CHOL 120 12/07/2011   TRIG 83 12/07/2011   HDL 27* 12/07/2011   LDLCALC 76 12/07/2011   ALT 11 12/06/2011   AST 19 12/06/2011   NA 136 12/13/2011   K 4.2 12/13/2011   CL 94* 12/13/2011   CREATININE 1.3* 12/13/2011   BUN 11 12/13/2011   CO2 34* 12/13/2011  TSH 3.650 12/05/2011   INR 1.34 12/05/2011   HGBA1C 7.4* 12/05/2011    MICROALBUR 2.45* 03/15/2007      Assessment & Plan:

## 2011-12-22 NOTE — Assessment & Plan Note (Signed)
She looks pale today and has quite a few symptoms today so I will check her CBC and iron level

## 2011-12-22 NOTE — Assessment & Plan Note (Signed)
She has both lung and heart disease causing this I think she may need continuous O2 so I have asked her to see Dr. Craige Cotta for further evaluation

## 2011-12-22 NOTE — Patient Instructions (Signed)

## 2011-12-22 NOTE — Assessment & Plan Note (Signed)
Will continue her current meds for now

## 2011-12-22 NOTE — Addendum Note (Signed)
Addended by: [name] on: 12/22/2011 12:21 PM   Modules accepted: Orders

## 2011-12-28 ENCOUNTER — Ambulatory Visit (INDEPENDENT_AMBULATORY_CARE_PROVIDER_SITE_OTHER): Payer: Medicare Other | Admitting: Pulmonary Disease

## 2011-12-28 ENCOUNTER — Encounter: Payer: Self-pay | Admitting: Pulmonary Disease

## 2011-12-28 VITALS — BP 122/82 | HR 79 | Temp 98.5°F | Ht 69.0 in | Wt 176.0 lb

## 2011-12-28 DIAGNOSIS — J4530 Mild persistent asthma, uncomplicated: Secondary | ICD-10-CM

## 2011-12-28 DIAGNOSIS — J45909 Unspecified asthma, uncomplicated: Secondary | ICD-10-CM

## 2011-12-28 DIAGNOSIS — R0609 Other forms of dyspnea: Secondary | ICD-10-CM

## 2011-12-28 DIAGNOSIS — G4733 Obstructive sleep apnea (adult) (pediatric): Secondary | ICD-10-CM

## 2011-12-28 DIAGNOSIS — R0989 Other specified symptoms and signs involving the circulatory and respiratory systems: Secondary | ICD-10-CM

## 2011-12-28 DIAGNOSIS — R0600 Dyspnea, unspecified: Secondary | ICD-10-CM

## 2011-12-28 DIAGNOSIS — Z720 Tobacco use: Secondary | ICD-10-CM

## 2011-12-28 DIAGNOSIS — R0902 Hypoxemia: Secondary | ICD-10-CM

## 2011-12-28 DIAGNOSIS — F17200 Nicotine dependence, unspecified, uncomplicated: Secondary | ICD-10-CM

## 2011-12-28 NOTE — Progress Notes (Signed)
Chief Complaint  Patient presents with  . Hospital Follow Up    Breathing is worse since leaving the hospital.    History of Present Illness: [name] is a 66 y.o. female smoker Chronic bronchitis/asthma, and OSA.  She was in hospital from 12/03 to 12/09 for CHF exacerbation.  She is still smoking 1/2 pack per day.   She gets winded very easily.  She has trouble sleeping at night, and gets short of breath if she lays flat.  She has trouble tolerating her CPAP >> she feels claustrophobic.  She has occasional cough.  She denies wheeze, sputum, or hemoptysis.  She uses her albuterol twice per day, and this helps.  She still uses singulair and dulera, but is not sure how much these help.  She still gets leg swelling.  She had her blood pressure medicines adjusted by [name] recently.  She was noted to have low oxygen saturation at recent visit with PCP.   Tests: PSG 03/28/06>>AHI 15  CPAP 10 cm CPAP 02/18/11 to 03/29/11>>Used on 17 of 30 nights with median 4 hrs 19 min. Spirometry 03/09/11>>FEV1 2.02 (88%), FEV1% 76 Echo 05/03/11>>mild LVH, EF 40 to 45% 12/28/11 SpO2 86% on room air with exertion >> start 2 liters oxygen  Past Medical History  Diagnosis Date  . DM   . DYSLIPIDEMIA   . OBESITY NOS   . DEPRESSION, RECURRENT, IN PARTIAL REMISSION   . HYPERTENSION   . 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 CONGESTIVE HEART FAILURE     EF 40-45% echo 05/2011  . GERD   . DIVERTICULOSIS, COLON, WITH HEMORRHAGE   . BACK PAIN, LUMBAR, WITH RADICULOPATHY   . OBSTRUCTIVE SLEEP APNEA   . Asthma   . Anemia, iron deficiency   . Hyponatremia 12/2011  . Tobacco abuse     Past Surgical History  Procedure Date  . Uvuloplasty   . Total abdominal hysterectomy w/ bilateral salpingoophorectomy   . Abdominal hysterectomy   . Cholecystectomy    . Coronary angioplasty with stent placement 2002    Current Outpatient Prescriptions on File Prior to Visit  Medication Sig Dispense Refill  . acetaminophen (TYLENOL ARTHRITIS PAIN) 650 MG CR tablet Take 1,300 mg by mouth every 8 (eight) hours as needed. For pain      . albuterol (PROVENTIL HFA;VENTOLIN HFA) 108 (90 BASE) MCG/ACT inhaler Inhale 2 puffs into the lungs every 6 (six) hours as needed. For shortness of breath      . aspirin 81 MG tablet Take 81 mg by mouth daily.        [name] atorvastatin (LIPITOR) 40 MG tablet Take 1 tablet (40 mg total) by mouth daily.  90 tablet  3  . CALCIUM-VITAMIN D PO Take 1 tablet by mouth daily.      . carvedilol (COREG) 25 MG tablet Take 37.5 mg by mouth 2 (two) times daily with a meal. Take one and one half tablet twice daily. PT WANTS BRAND ONLY      . ferrous fumarate (FERRETTS) 325 (106 FE) MG TABS Take 1 tablet (106 mg of iron total) by mouth 2 (two) times daily.  60 tablet  11  . furosemide (LASIX) 80 MG tablet Take 1 tablet (80 mg total) by mouth 2 (two) times daily.  60 tablet  6  .  Gabapentin, PHN, 300 MG TABS Take 1 tablet by mouth 3 (three) times daily.      . hydrALAZINE (APRESOLINE) 25 MG tablet Take 1 tablet (25 mg total) by mouth every 8 (eight) hours.  90 tablet  6  . insulin aspart (NOVOLOG) 100 UNIT/ML injection Inject 10-15 Units into the skin 3 (three) times daily before meals. Sliding scale.    15 units at breakfast, 10units at lunch,10 units at dinner      . insulin glargine (LANTUS) 100 UNIT/ML injection Inject 10-20 Units into the skin at bedtime. Per sliding scale      . methocarbamol (ROBAXIN) 500 MG tablet Take 500 mg by mouth 3 (three) times daily.      . mometasone-formoterol (DULERA) 100-5 MCG/ACT AERO Inhale 2 puffs into the lungs 2 (two) times daily.      . montelukast (SINGULAIR) 10 MG tablet Take 10 mg by mouth at bedtime.      [name] olmesartan (BENICAR) 40 MG tablet Take 1 tablet (40 mg total) by mouth daily.  30 tablet  1  .  pantoprazole (PROTONIX) 40 MG tablet Take 40 mg by mouth 2 (two) times daily.      . Polyethylene Glycol 3350 (MIRALAX PO) Take 17 g by mouth daily. As directed       . potassium chloride (MICRO-K) 10 MEQ CR capsule Take 2 capsules (20 mEq total) by mouth 2 (two) times daily.  120 capsule  6  . sitaGLIPtan-metformin (JANUMET) 50-500 MG per tablet Take 1 tablet by mouth 2 (two) times daily with a meal.      . traZODone (DESYREL) 100 MG tablet Take 150 mg by mouth at bedtime. 1 and 1/2 pills at hs      . venlafaxine (EFFEXOR XR) 150 MG 24 hr capsule Take 150 mg by mouth 2 (two) times daily.        . varenicline (CHANTIX CONTINUING MONTH PAK) 1 MG tablet Take 1 tablet (1 mg total) by mouth 2 (two) times daily.  60 tablet  3  . varenicline (CHANTIX STARTING MONTH PAK) 0.5 MG X 11 & 1 MG X 42 tablet Take 1 0.5 mg tab by mouth daily X 3 days, then increase to one 0.5 mg tab twice daily X 4 days, then increase to 1 mg tab twice daily.  53 tablet  0  . [DISCONTINUED] budesonide-formoterol (SYMBICORT) 80-4.5 MCG/ACT inhaler Inhale 2 puffs into the lungs 2 (two) times daily.  1 Inhaler  12    Allergies  Allergen Reactions  . Amlodipine Other (See Comments)    constipation  . Ramipril Cough  . Adhesive (Tape) Hives    "peels skin off"  . Penicillins Itching  . Codeine Rash    Physical Exam:  Filed Vitals:   12/28/11 1336  BP: 122/82  Pulse: 79  Temp: 98.5 F (36.9 C)  TempSrc: Oral  Height: 5\' 9"  (1.753 m)  Weight: 176 lb (79.833 kg)  SpO2: 91%    Body mass index is 25.99 kg/(m^2).   Wt Readings from Last 2 Encounters:  12/28/11 176 lb (79.833 kg)  12/22/11 179 lb 4 oz (81.307 kg)    General - thin HEENT - no sinus tenderness, no oral exudate, no LAN Cardiac - s1s2 regular, no murmur Chest - decreased breath sounds, no wheeze/rales/dullness Abdomen - soft, non-tender Extremities - ankle edema Skin - no rashes Neurologic - normal strength Psychiatric - normal mood,  behavior   12/05/2011  *RADIOLOGY REPORT*  Clinical Data:  Shortness of breath, cough.  CHEST - 2 VIEW  Comparison: 05/01/2011  Findings: Cardiomegaly with vascular congestion.  Diffuse interstitial prominence is stable, likely mild chronic interstitial lung disease.  Stable elevation of the right hemidiaphragm. Blunting of the right costophrenic angle compatible with small effusion.  No confluent airspace opacities.  IMPRESSION: Cardiomegaly, vascular congestion.  Stable chronic interstitial prominence.  Suspect small right pleural effusion.   Original Report Authenticated By: Charlett Nose, M.D.     Assessment/Plan:  [name], MD [facility] Pulmonary/Critical Care 12/28/2011, 1:53 PM Pager:  [phone] After 3pm call: [phone]

## 2011-12-28 NOTE — Patient Instructions (Signed)
Will arrange for V/Q scan and chest xray Will arrange for PFT Will set up oxygen at home Will set up overnight oxygen test Follow up in 4 weeks

## 2011-12-29 ENCOUNTER — Other Ambulatory Visit (HOSPITAL_COMMUNITY): Payer: Medicare Other

## 2011-12-29 ENCOUNTER — Encounter (HOSPITAL_COMMUNITY): Payer: Medicare Other

## 2011-12-29 ENCOUNTER — Other Ambulatory Visit: Payer: Self-pay | Admitting: *Deleted

## 2011-12-29 DIAGNOSIS — E785 Hyperlipidemia, unspecified: Secondary | ICD-10-CM

## 2011-12-29 MED ORDER — ATORVASTATIN CALCIUM 40 MG PO TABS: 40.0000 mg | ORAL_TABLET | Freq: Every day | ORAL | Status: DC

## 2012-01-01 ENCOUNTER — Ambulatory Visit (HOSPITAL_COMMUNITY)
Admission: RE | Admit: 2012-01-01 | Discharge: 2012-01-01 | Disposition: A | Discharge status: Home / Self Care | Payer: Medicare Other | Source: Ambulatory Visit | Attending: Pulmonary Disease | Admitting: Pulmonary Disease

## 2012-01-01 ENCOUNTER — Encounter (HOSPITAL_COMMUNITY)
Admission: RE | Admit: 2012-01-01 | Discharge: 2012-01-01 | Disposition: A | Discharge status: Home / Self Care | Payer: Medicare Other | Source: Ambulatory Visit | Attending: Pulmonary Disease | Admitting: Pulmonary Disease

## 2012-01-01 DIAGNOSIS — R0602 Shortness of breath: Secondary | ICD-10-CM | POA: Insufficient documentation

## 2012-01-01 DIAGNOSIS — I517 Cardiomegaly: Secondary | ICD-10-CM | POA: Insufficient documentation

## 2012-01-01 DIAGNOSIS — R0902 Hypoxemia: Secondary | ICD-10-CM

## 2012-01-01 DIAGNOSIS — R0609 Other forms of dyspnea: Secondary | ICD-10-CM | POA: Insufficient documentation

## 2012-01-01 DIAGNOSIS — R0989 Other specified symptoms and signs involving the circulatory and respiratory systems: Secondary | ICD-10-CM | POA: Insufficient documentation

## 2012-01-01 DIAGNOSIS — J45909 Unspecified asthma, uncomplicated: Secondary | ICD-10-CM | POA: Insufficient documentation

## 2012-01-01 DIAGNOSIS — R0600 Dyspnea, unspecified: Secondary | ICD-10-CM

## 2012-01-01 DIAGNOSIS — J90 Pleural effusion, not elsewhere classified: Secondary | ICD-10-CM | POA: Insufficient documentation

## 2012-01-01 MED ORDER — TECHNETIUM TO 99M ALBUMIN AGGREGATED
5.7000 | Freq: Once | INTRAVENOUS | Status: AC | PRN
  Administered 2012-01-01: 6 via INTRAVENOUS

## 2012-01-01 MED ORDER — TECHNETIUM TC 99M DIETHYLENETRIAME-PENTAACETIC ACID
50.0000 | Freq: Once | INTRAVENOUS | Status: AC | PRN
  Administered 2012-01-01: 50 via INTRAVENOUS

## 2012-01-05 ENCOUNTER — Telehealth: Payer: Self-pay | Admitting: Pulmonary Disease

## 2012-01-05 NOTE — Telephone Encounter (Signed)
I spoke with pt and she is requesting test results from 01/01/12 (in epic). Pt aware VS is out of the office until Monday. Please advise Dr. Craige Cotta thanks

## 2012-01-09 DIAGNOSIS — Z720 Tobacco use: Secondary | ICD-10-CM | POA: Insufficient documentation

## 2012-01-09 NOTE — Assessment & Plan Note (Signed)
She is to continue CPAP.  Will arrange for ONO with CPAP.

## 2012-01-09 NOTE — Assessment & Plan Note (Signed)
She has persistent dyspnea with exertion.  She had oxygen desaturation with exertion.  Will arrange for home oxygen set up.  Will arrange for full PFT's and V/Q scan to further assess.

## 2012-01-09 NOTE — Assessment & Plan Note (Signed)
She is to continue dulera, singulair, and prn albuterol.  Further adjustments will be made after review of her PFT.

## 2012-01-09 NOTE — Telephone Encounter (Signed)
Dg Chest 2 View  01/01/2012  *RADIOLOGY REPORT*  Clinical Data: Persistent cough and shortness of breath.  CHEST - 2 VIEW  Comparison: 12/05/2011  Findings: Stable asymmetric elevation of the right hemidiaphragm. The cardiopericardial silhouette is enlarged.  Vascular congestion again noted with a tiny right pleural effusion, as before. Imaged bony structures of the thorax are intact.  IMPRESSION: Cardiomegaly with vascular congestion and tiny right pleural effusion.  Stable exam.   Original Report Authenticated By: Kennith Center, M.D.    [facility] Pulmonary Per & Vent  01/01/2012  *RADIOLOGY REPORT*  Clinical Data:  Asthma, short of breath, dyspnea  NUCLEAR MEDICINE VENTILATION - PERFUSION LUNG SCAN  Technique:  Ventilation images were obtained in multiple projections using inhaled aerosol technetium 99 M DTPA.  Perfusion images were obtained in multiple projections after intravenous injection of Tc-42m MAA.  Radiopharmaceuticals:  Tc-71m DTPA aerosol 5.7 mCi Tc-68m MAA.  Comparison: Chest radiograph 12/ 30/2013  Findings:  Ventilation:   No focal ventilation defect.  Mild decreased ventilation in the upper lobes.  Perfusion:   No wedge shaped peripheral perfusion defects to suggest acute pulmonary embolism  IMPRESSION: Very low probability acute pulmonary embolism.   Original Report Authenticated By: Genevive Bi, M.D.      Will have my nurse inform pt that V/Q lung scan was normal >> no evidence for lung clots.  Chest xray showed expected changes from known history of heart failure.  No other worrisome findings.  No change to current treatment plan.

## 2012-01-09 NOTE — Telephone Encounter (Signed)
I spoke with patient about results and she verbalized understanding and had no questions 

## 2012-01-09 NOTE — Assessment & Plan Note (Signed)
Emphasized the importance of complete smoking abstinence.

## 2012-01-10 ENCOUNTER — Other Ambulatory Visit: Payer: Self-pay | Admitting: *Deleted

## 2012-01-10 MED ORDER — CARVEDILOL 25 MG PO TABS: 37.5000 mg | ORAL_TABLET | Freq: Two times a day (BID) | ORAL | Status: DC

## 2012-01-10 MED ORDER — GABAPENTIN (ONCE-DAILY) 300 MG PO TABS: 1.0000 | ORAL_TABLET | Freq: Three times a day (TID) | ORAL | Status: DC

## 2012-01-11 ENCOUNTER — Other Ambulatory Visit: Payer: Self-pay | Admitting: Pulmonary Disease

## 2012-01-17 ENCOUNTER — Telehealth: Payer: Self-pay | Admitting: *Deleted

## 2012-01-17 ENCOUNTER — Encounter: Payer: Self-pay | Admitting: Physician Assistant

## 2012-01-17 ENCOUNTER — Ambulatory Visit (INDEPENDENT_AMBULATORY_CARE_PROVIDER_SITE_OTHER): Payer: Medicare Other | Admitting: Physician Assistant

## 2012-01-17 VITALS — BP 132/64 | HR 61 | Ht 70.0 in | Wt 165.1 lb

## 2012-01-17 DIAGNOSIS — I5043 Acute on chronic combined systolic (congestive) and diastolic (congestive) heart failure: Secondary | ICD-10-CM

## 2012-01-17 DIAGNOSIS — I2510 Atherosclerotic heart disease of native coronary artery without angina pectoris: Secondary | ICD-10-CM

## 2012-01-17 DIAGNOSIS — F17200 Nicotine dependence, unspecified, uncomplicated: Secondary | ICD-10-CM

## 2012-01-17 DIAGNOSIS — I10 Essential (primary) hypertension: Secondary | ICD-10-CM

## 2012-01-17 DIAGNOSIS — Z720 Tobacco use: Secondary | ICD-10-CM

## 2012-01-17 DIAGNOSIS — I5042 Chronic combined systolic (congestive) and diastolic (congestive) heart failure: Secondary | ICD-10-CM

## 2012-01-17 LAB — BASIC METABOLIC PANEL
BUN: 9 mg/dL (ref 6–23)
Calcium: 9.1 mg/dL (ref 8.4–10.5)
Creatinine, Ser: 1.1 mg/dL (ref 0.4–1.2)
GFR: 64.19 mL/min (ref >60.00)
Glucose, Bld: 135 mg/dL — ABNORMAL HIGH (ref 70–99)
Potassium: 4.2 mEq/L (ref 3.5–5.1)

## 2012-01-17 NOTE — Telephone Encounter (Signed)
Message copied by Tarri Fuller on Wed Jan 17, 2012  5:26 PM ------      Message from: [place], [place] T      Created: Wed Jan 17, 2012  4:52 PM       Potassium and kidney function look good.      Continue with current treatment plan.      Tereso Newcomer, PA-C  4:49 PM 11/16/2011

## 2012-01-17 NOTE — Telephone Encounter (Signed)
pt notified about lab results w/verbal understanding today 

## 2012-01-17 NOTE — Progress Notes (Signed)
[address], [address] [place], [place]  [address] Phone: [phone], Fax:  [phone]  Date:  01/17/2012   Name:  [name]   DOB:  01-11-1945   MRN:  [id number]  PCP:  [name], MD  Primary Cardiologist:  Dr. [name]  Primary Electrophysiologist:  None    History of Present Illness: [name] is a 67 y.o. female who returns for follow up.  She has a hx of CAD, s/p MI in 2002 treated with RCA stent, DM2, HTN, HL, CHF, sleep apnea, GERD, asthma, tobacco abuse. LHC 4/13:  dLAD 99% unchanged from 2010 (too small for PCI), mid and distal RCA stents patent, pRCA 30% before stent, EF 35%. Echo 5/13: Mild LVH, indeterminate diastolic function, EF 40-45%, moderate to severe inferior and posterior HK, mild LAE, mild RAE. Medical therapy continued.   She was admitted 12/3-12/9 with a/c combined systolic and diastolic CHF. She was seen by pulmonology during that admission. She was not felt to have interstitial lung disease. Smoking cessation was recommended. She is followed by Dr. Craige Cotta.  Seen in followup by [name] 12/15/11.  No changes made that admission.  She is now on O2.  Feels like her breathing is some better.  She is NYHA Class III.  No orthopnea, PND.  LE edema resolved.  No chest pain.  No syncope.  She quit smoking!  Labs (12/13):   K 4.2, creatinine 1.3, ALT 11, proBNP 28602, LDL 76, Hgb 9.8, TSH 3.650  Wt Readings from Last 3 Encounters:  01/17/12 165 lb 1.9 oz (74.898 kg)  12/28/11 176 lb (79.833 kg)  12/22/11 179 lb 4 oz (81.307 kg)     Past Medical History  Diagnosis Date  . DM   . DYSLIPIDEMIA   . OBESITY NOS   . DEPRESSION, RECURRENT, IN PARTIAL REMISSION   . HYPERTENSION   . 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 CONGESTIVE HEART FAILURE     EF 40-45% echo 05/2011  . GERD   .  DIVERTICULOSIS, COLON, WITH HEMORRHAGE   . BACK PAIN, LUMBAR, WITH RADICULOPATHY   . OBSTRUCTIVE SLEEP APNEA   . Asthma   . Anemia, iron deficiency   . Hyponatremia 12/2011  . Tobacco abuse     Current Outpatient Prescriptions  Medication Sig Dispense Refill  . acetaminophen (TYLENOL ARTHRITIS PAIN) 650 MG CR tablet Take 1,300 mg by mouth every 8 (eight) hours as needed. For pain      . albuterol (PROVENTIL HFA;VENTOLIN HFA) 108 (90 BASE) MCG/ACT inhaler Inhale 2 puffs into the lungs every 6 (six) hours as needed. For shortness of breath      . aspirin 81 MG tablet Take 81 mg by mouth daily.        [name] atorvastatin (LIPITOR) 40 MG tablet Take 1 tablet (40 mg total) by mouth daily.  90 tablet  3  . CALCIUM-VITAMIN D PO Take 1 tablet by mouth daily.      . carvedilol (COREG) 25 MG tablet Take 1.5 tablets (37.5 mg total) by mouth 2 (two) times daily with a meal. Take one and one half tablet twice daily. PT WANTS BRAND ONLY  90 tablet  11  . ferrous fumarate (FERRETTS) 325 (106 FE) MG TABS Take 1 tablet (106 mg of iron total) by mouth  2 (two) times daily.  60 tablet  11  . furosemide (LASIX) 80 MG tablet Take 1 tablet (80 mg total) by mouth 2 (two) times daily.  60 tablet  6  . Gabapentin, PHN, 300 MG TABS Take 1 tablet by mouth 3 (three) times daily.  180 tablet  0  . hydrALAZINE (APRESOLINE) 25 MG tablet Take 1 tablet (25 mg total) by mouth every 8 (eight) hours.  90 tablet  6  . insulin aspart (NOVOLOG) 100 UNIT/ML injection Inject 10-15 Units into the skin 3 (three) times daily before meals. Sliding scale.    15 units at breakfast, 10units at lunch,10 units at dinner      . insulin glargine (LANTUS) 100 UNIT/ML injection Inject 10-20 Units into the skin at bedtime. Per sliding scale      . isosorbide mononitrate (IMDUR) 30 MG 24 hr tablet Take 30 mg by mouth daily.      . methocarbamol (ROBAXIN) 500 MG tablet Take 500 mg by mouth 3 (three) times daily.      . mometasone-formoterol (DULERA)  100-5 MCG/ACT AERO Inhale 2 puffs into the lungs 2 (two) times daily.      . montelukast (SINGULAIR) 10 MG tablet Take 10 mg by mouth at bedtime.      [name] olmesartan (BENICAR) 40 MG tablet Take 1 tablet (40 mg total) by mouth daily.  30 tablet  1  . pantoprazole (PROTONIX) 40 MG tablet Take 40 mg by mouth 2 (two) times daily.      . Polyethylene Glycol 3350 (MIRALAX PO) Take 17 g by mouth daily. As directed       . potassium chloride (MICRO-K) 10 MEQ CR capsule Take 2 capsules (20 mEq total) by mouth 2 (two) times daily.  120 capsule  6  . sitaGLIPtan-metformin (JANUMET) 50-500 MG per tablet Take 1 tablet by mouth 2 (two) times daily with a meal.      . traZODone (DESYREL) 100 MG tablet Take 150 mg by mouth at bedtime. 1 and 1/2 pills at hs      . varenicline (CHANTIX CONTINUING MONTH PAK) 1 MG tablet Take 1 tablet (1 mg total) by mouth 2 (two) times daily.  60 tablet  3  . varenicline (CHANTIX STARTING MONTH PAK) 0.5 MG X 11 & 1 MG X 42 tablet Take 1 0.5 mg tab by mouth daily X 3 days, then increase to one 0.5 mg tab twice daily X 4 days, then increase to 1 mg tab twice daily.  53 tablet  0  . venlafaxine (EFFEXOR XR) 150 MG 24 hr capsule Take 150 mg by mouth 2 (two) times daily.        . [DISCONTINUED] budesonide-formoterol (SYMBICORT) 80-4.5 MCG/ACT inhaler Inhale 2 puffs into the lungs 2 (two) times daily.  1 Inhaler  12    Allergies: Allergies  Allergen Reactions  . Amlodipine Other (See Comments)    constipation  . Ramipril Cough  . Adhesive (Tape) Hives    "peels skin off"  . Penicillins Itching  . Codeine Rash    Social History:  The patient  reports that she has quit smoking. Her smoking use included Cigarettes. She has a 20 pack-year smoking history. She has never used smokeless tobacco. She reports that she does not drink alcohol or use illicit drugs.   ROS:  Please see the history of present illness.      All other systems reviewed and negative.   PHYSICAL EXAM: VS:  BP  132/64  Pulse 61  Ht 5\' 10"  (1.778 m)  Wt 165 lb 1.9 oz (74.898 kg)  BMI 23.69 kg/m2 Well nourished, well developed, in no acute distress HEENT: normal Neck: no JVD Cardiac:  normal S1, S2; RRR; no murmur Lungs:  Decreased breath sounds bilaterally, no wheezing, rhonchi or rales Abd: soft, nontender, no hepatomegaly Ext: no edema Skin: warm and dry Neuro:  CNs 2-12 intact, no focal abnormalities noted  EKG:  NSR, HR 68, normal axis, T-wave inversions in V4-V6, first degree AV block, PR 320 ms, no change from prior tracing     ASSESSMENT AND PLAN:  1. Chronic Combined Systolic and Diastolic CHF: Volume appears stable. Weight is down. Breathing is actually improved since starting oxygen. She has stopped smoking. Blood pressure stable. Continue current regimen which includes ARB, beta blocker, hydralazine and nitrates. Check a basic metabolic panel today. 2. Hypertension:  Controlled. 3. Tobacco Abuse:  I congratulated her on quitting. 4. Normocytic Anemia:  Followed by primary care. 5. Hyperlipidemia:  Continue Lipitor. 6. Coronary Artery Disease:  No angina. Continue aspirin and statin. 7. Disposition:  Followup with me in 3 months and [name] in 6 months.  Luna Glasgow, PA-C  9:26 AM 01/17/2012

## 2012-01-17 NOTE — Patient Instructions (Addendum)
LAB TODAY; BMET  PLEASE FOLLOW UP WITH [place], [facility] 34/21/14 @ 11:10  Your physician wants you to follow-up in: 6 MONTHS WITH DR. HOCHREIN. You will receive a reminder letter in the mail two months in advance. If you don't receive a letter, please call our office to schedule the follow-up appointment.  NO CHANGES WITH MEDICATIONS TODAY

## 2012-01-31 ENCOUNTER — Ambulatory Visit: Payer: Medicare Other | Admitting: Pulmonary Disease

## 2012-02-05 ENCOUNTER — Other Ambulatory Visit: Payer: Self-pay | Admitting: *Deleted

## 2012-02-05 MED ORDER — GABAPENTIN (ONCE-DAILY) 300 MG PO TABS: 1.0000 | ORAL_TABLET | Freq: Three times a day (TID) | ORAL | Status: DC

## 2012-02-08 ENCOUNTER — Other Ambulatory Visit: Payer: Self-pay

## 2012-02-08 MED ORDER — GABAPENTIN (ONCE-DAILY) 300 MG PO TABS: 1.0000 | ORAL_TABLET | Freq: Three times a day (TID) | ORAL | Status: DC

## 2012-02-26 ENCOUNTER — Other Ambulatory Visit: Payer: Self-pay | Admitting: Internal Medicine

## 2012-02-26 DIAGNOSIS — R921 Mammographic calcification found on diagnostic imaging of breast: Secondary | ICD-10-CM

## 2012-02-28 ENCOUNTER — Telehealth: Payer: Self-pay | Admitting: Pulmonary Disease

## 2012-02-28 NOTE — Telephone Encounter (Signed)
lmomtcb x1 --have not received anything on this pt.

## 2012-02-29 NOTE — Telephone Encounter (Signed)
lmtcb x2 

## 2012-03-01 ENCOUNTER — Encounter: Payer: Self-pay | Admitting: Pulmonary Disease

## 2012-03-01 ENCOUNTER — Ambulatory Visit (INDEPENDENT_AMBULATORY_CARE_PROVIDER_SITE_OTHER): Payer: Medicare Other | Admitting: Pulmonary Disease

## 2012-03-01 VITALS — BP 120/70 | HR 80 | Temp 97.9°F | Ht 69.0 in | Wt 163.0 lb

## 2012-03-01 DIAGNOSIS — Z720 Tobacco use: Secondary | ICD-10-CM

## 2012-03-01 DIAGNOSIS — R0902 Hypoxemia: Secondary | ICD-10-CM

## 2012-03-01 DIAGNOSIS — R0600 Dyspnea, unspecified: Secondary | ICD-10-CM

## 2012-03-01 DIAGNOSIS — F17200 Nicotine dependence, unspecified, uncomplicated: Secondary | ICD-10-CM

## 2012-03-01 DIAGNOSIS — J449 Chronic obstructive pulmonary disease, unspecified: Secondary | ICD-10-CM

## 2012-03-01 DIAGNOSIS — G4733 Obstructive sleep apnea (adult) (pediatric): Secondary | ICD-10-CM

## 2012-03-01 DIAGNOSIS — R0609 Other forms of dyspnea: Secondary | ICD-10-CM

## 2012-03-01 DIAGNOSIS — R0989 Other specified symptoms and signs involving the circulatory and respiratory systems: Secondary | ICD-10-CM

## 2012-03-01 LAB — PULMONARY FUNCTION TEST

## 2012-03-01 MED ORDER — TIOTROPIUM BROMIDE MONOHYDRATE 18 MCG IN CAPS: 18.0000 ug | ORAL_CAPSULE | Freq: Every day | RESPIRATORY_TRACT | Status: DC

## 2012-03-01 MED ORDER — ALBUTEROL SULFATE (2.5 MG/3ML) 0.083% IN NEBU: 2.5000 mg | INHALATION_SOLUTION | Freq: Four times a day (QID) | RESPIRATORY_TRACT | Status: DC | PRN

## 2012-03-01 NOTE — Assessment & Plan Note (Signed)
She has moderate airflow obstruction and diffusion defect on PFT.  This is consistent with COPD/emphysema.  Will add spiriva, and continue prn albuterol.  Will arrange for referral to pulmonary rehab.

## 2012-03-01 NOTE — Assessment & Plan Note (Signed)
Will get copy of her ONO with CPAP and 2 liters and call her with results.

## 2012-03-01 NOTE — Assessment & Plan Note (Signed)
Congratulated her on her smoking cessation.

## 2012-03-01 NOTE — Progress Notes (Signed)
Chief Complaint  Patient presents with  . Asthma    Breathing is doing well with O2. Reports chest pain and chest tightness. Denies coughing.    History of Present Illness: [name] is a 67 y.o. female former smoker with moderate COPD/emphysema, OSA, and exertional/nocturnal hypoxemia.  She is here to review her PFT's.  She had trouble doing lung volumes.  Quit smoking 1 month ago.  She still has cough and chest congestion.  She is not having as much wheezing.  She still gets winded easily with exertion.  She continues to use oxygen and CPAP.    She stop dulera, and singulair.  She would get nauseous from using singulair, but thought that dulera may have been a cause for this also.  She felt better when she was using her inhaler.  She has an albuterol nebulizer and uses this at least daily.   TESTS: PSG 03/28/06>>AHI 15, CPAP 10 cm  CPAP 02/18/11 to 03/29/11>>Used on 17 of 30 nights with median 4 hrs 19 min.  Spirometry 03/09/11>>FEV1 2.02 (88%), FEV1% 76  Echo 05/03/11>>mild LVH, EF 40 to 45%  12/28/11 SpO2 86% on room air with exertion >> start 2 liters oxygen 01/01/12 V/Q scan >> very low probability for PE PFT 03/01/12 >> FEV1 1.60 (63%), FEV1% 68, DLCO 45%, no BD; unable to do lung volumes  [name]  has a past medical history of DM; DYSLIPIDEMIA; OBESITY NOS; DEPRESSION, RECURRENT, IN PARTIAL REMISSION; HYPERTENSION; CORONARY ARTERY DISEASE; CONGESTIVE HEART FAILURE; GERD; DIVERTICULOSIS, COLON, WITH HEMORRHAGE; BACK PAIN, LUMBAR, WITH RADICULOPATHY; OBSTRUCTIVE SLEEP APNEA; Asthma; Anemia, iron deficiency; Hyponatremia (12/2011); Tobacco abuse; and Moderate COPD (chronic obstructive pulmonary disease) (11/07/2005).  [name]  has past surgical history that includes uvuloplasty; Total abdominal hysterectomy w/ bilateral salpingoophorectomy; Abdominal hysterectomy; Cholecystectomy; and Coronary angioplasty with stent (2002).  Prior to Admission  medications   Medication Sig Start Date End Date Taking? Authorizing Provider  acetaminophen (TYLENOL ARTHRITIS PAIN) 650 MG CR tablet Take 1,300 mg by mouth every 8 (eight) hours as needed. For pain   Yes Historical Provider, MD  aspirin 81 MG tablet Take 81 mg by mouth daily.     Yes Historical Provider, MD  atorvastatin (LIPITOR) 40 MG tablet Take 1 tablet (40 mg total) by mouth daily. 12/29/11 12/28/12 Yes [name], MD  carvedilol (COREG) 25 MG tablet Take 1.5 tablets (37.5 mg total) by mouth 2 (two) times daily with a meal. Take one and one half tablet twice daily. PT WANTS BRAND ONLY 01/10/12  Yes [name], MD  ferrous fumarate (FERRETTS) 325 (106 FE) MG TABS Take 1 tablet (106 mg of iron total) by mouth 2 (two) times daily. 12/22/11  Yes [name], MD  furosemide (LASIX) 80 MG tablet Take 1 tablet (80 mg total) by mouth 2 (two) times daily. 12/11/11  Yes Jessica A Hope, PA-C  Gabapentin, PHN, 300 MG TABS Take 1 tablet by mouth 3 (three) times daily. 02/08/12  Yes Clydie Braun Prueter, PA-C  hydrALAZINE (APRESOLINE) 25 MG tablet Take 1 tablet (25 mg total) by mouth every 8 (eight) hours. 12/11/11  Yes Jessica A Hope, PA-C  insulin aspart (NOVOLOG) 100 UNIT/ML injection Inject 10-15 Units into the skin 3 (three) times daily before meals. Sliding scale.    15 units at breakfast, 10units at lunch,10 units at dinner   Yes Historical Provider, MD  insulin glargine (LANTUS) 100 UNIT/ML injection Inject 10-20 Units into the skin at bedtime. Per sliding scale  Yes Historical Provider, MD  isosorbide mononitrate (IMDUR) 30 MG 24 hr tablet Take 30 mg by mouth daily.   Yes Historical Provider, MD  montelukast (SINGULAIR) 10 MG tablet Take 10 mg by mouth at bedtime. 07/04/11 07/03/12 Yes [name], MD  olmesartan (BENICAR) 40 MG tablet Take 1 tablet (40 mg total) by mouth daily. 12/15/11 12/14/12 Yes [name], MD  pantoprazole (PROTONIX) 40 MG tablet Take 40 mg by mouth 2 (two) times daily. 04/26/11   Yes [name], MD  Polyethylene Glycol 3350 (MIRALAX PO) Take 17 g by mouth daily. As directed    Yes Historical Provider, MD  potassium chloride (MICRO-K) 10 MEQ CR capsule Take 2 capsules (20 mEq total) by mouth 2 (two) times daily. 12/11/11  Yes Jessica A Hope, PA-C  sitaGLIPtan-metformin (JANUMET) 50-500 MG per tablet Take 1 tablet by mouth 2 (two) times daily with a meal. 06/27/11 06/26/12 Yes [name], MD  traZODone (DESYREL) 100 MG tablet Take 150 mg by mouth at bedtime. 1 and 1/2 pills at hs   Yes Historical Provider, MD  venlafaxine (EFFEXOR XR) 150 MG 24 hr capsule Take 150 mg by mouth 2 (two) times daily.     Yes Historical Provider, MD    Allergies  Allergen Reactions  . Amlodipine Other (See Comments)    constipation  . Ramipril Cough  . Adhesive (Tape) Hives    "peels skin off"  . Penicillins Itching  . Codeine Rash     Physical Exam:  General - No distress ENT - No sinus tenderness, no oral exudate, no LAN Cardiac - s1s2 regular, no murmur Chest - Decreased breath sounds, no wheeze Back - No focal tenderness Abd - Soft, non-tender Ext - No edema Neuro - Normal strength Skin - No rashes Psych - normal mood, and behavior   Assessment/Plan:  [name], MD [place] [facility] Pager:  [phone] 03/01/2012, 1:57 PM

## 2012-03-01 NOTE — Patient Instructions (Signed)
Spiriva one puff daily Albuterol one vial nebulized up to four times per day as needed Will arrange for pulmonary rehab referral Will get overnight oxygen test with CPAP and oxygen Follow up in 3 months

## 2012-03-01 NOTE — Telephone Encounter (Signed)
Alida have you seen anything on this patient for oxygen? Please advise. Carron Curie, CMA

## 2012-03-01 NOTE — Assessment & Plan Note (Signed)
She is to continue with 2 liters oxygen with exertion and sleep.

## 2012-03-01 NOTE — Telephone Encounter (Signed)
Spoke with [facility], informed her this pt has APS for her DME CO. And [facility] has sent quite a bit of request for services which patient has not requested. She said she will take of list .Kandice Hams

## 2012-03-01 NOTE — Progress Notes (Signed)
PFT Done. Jennifer Castillo, CMA  

## 2012-03-04 ENCOUNTER — Telehealth: Payer: Self-pay | Admitting: Pulmonary Disease

## 2012-03-04 NOTE — Telephone Encounter (Signed)
CPAP 12/28/11 to 01/07/12 >> Used on 11 of 11 nights with average 8 hrs 39 min.  ONO with CPAP and 2 liters >> Test time 7 hrs 29 min.  Mean SpO2 94.7%, low SpO2 86%.  Spent 3 min with SpO2 < 88%.  Will have my nurse inform pt that CPAP and ONO reports looked good.  She is to continue current set up of CPAP and oxygen.

## 2012-03-05 NOTE — Telephone Encounter (Signed)
I spoke with patient about results and she verbalized understanding and had no questions 

## 2012-03-06 ENCOUNTER — Ambulatory Visit
Admission: RE | Admit: 2012-03-06 | Discharge: 2012-03-06 | Disposition: A | Discharge status: Home / Self Care | Payer: Medicare Other | Source: Ambulatory Visit | Attending: Internal Medicine | Admitting: Internal Medicine

## 2012-03-06 DIAGNOSIS — R921 Mammographic calcification found on diagnostic imaging of breast: Secondary | ICD-10-CM

## 2012-03-06 LAB — HM MAMMOGRAPHY: HM Mammogram: NORMAL

## 2012-03-13 ENCOUNTER — Encounter
Payer: Medicare Other | Attending: Physical Medicine and Rehabilitation | Admitting: Physical Medicine and Rehabilitation

## 2012-03-13 ENCOUNTER — Encounter: Payer: Self-pay | Admitting: Physical Medicine and Rehabilitation

## 2012-03-13 VITALS — BP 127/73 | HR 81 | Resp 14 | Ht 69.0 in | Wt 161.0 lb

## 2012-03-13 DIAGNOSIS — F3289 Other specified depressive episodes: Secondary | ICD-10-CM | POA: Insufficient documentation

## 2012-03-13 DIAGNOSIS — I2510 Atherosclerotic heart disease of native coronary artery without angina pectoris: Secondary | ICD-10-CM | POA: Insufficient documentation

## 2012-03-13 DIAGNOSIS — E669 Obesity, unspecified: Secondary | ICD-10-CM | POA: Insufficient documentation

## 2012-03-13 DIAGNOSIS — M5030 Other cervical disc degeneration, unspecified cervical region: Secondary | ICD-10-CM | POA: Insufficient documentation

## 2012-03-13 DIAGNOSIS — E119 Type 2 diabetes mellitus without complications: Secondary | ICD-10-CM | POA: Insufficient documentation

## 2012-03-13 DIAGNOSIS — G4733 Obstructive sleep apnea (adult) (pediatric): Secondary | ICD-10-CM | POA: Insufficient documentation

## 2012-03-13 DIAGNOSIS — E785 Hyperlipidemia, unspecified: Secondary | ICD-10-CM | POA: Insufficient documentation

## 2012-03-13 DIAGNOSIS — M79609 Pain in unspecified limb: Secondary | ICD-10-CM | POA: Insufficient documentation

## 2012-03-13 DIAGNOSIS — M47812 Spondylosis without myelopathy or radiculopathy, cervical region: Secondary | ICD-10-CM | POA: Insufficient documentation

## 2012-03-13 DIAGNOSIS — J449 Chronic obstructive pulmonary disease, unspecified: Secondary | ICD-10-CM | POA: Insufficient documentation

## 2012-03-13 DIAGNOSIS — J4489 Other specified chronic obstructive pulmonary disease: Secondary | ICD-10-CM | POA: Insufficient documentation

## 2012-03-13 DIAGNOSIS — M542 Cervicalgia: Secondary | ICD-10-CM | POA: Insufficient documentation

## 2012-03-13 DIAGNOSIS — K219 Gastro-esophageal reflux disease without esophagitis: Secondary | ICD-10-CM | POA: Insufficient documentation

## 2012-03-13 DIAGNOSIS — I10 Essential (primary) hypertension: Secondary | ICD-10-CM | POA: Insufficient documentation

## 2012-03-13 DIAGNOSIS — F329 Major depressive disorder, single episode, unspecified: Secondary | ICD-10-CM | POA: Insufficient documentation

## 2012-03-13 MED ORDER — METHOCARBAMOL 500 MG PO TABS: 500.0000 mg | ORAL_TABLET | Freq: Two times a day (BID) | ORAL | Status: DC | PRN

## 2012-03-13 NOTE — Progress Notes (Signed)
Subjective:    Patient ID: [name], female    DOB: Dec 19, 1945, 67 y.o.   MRN: [id number]  HPI The patient is a 67 year old female, who presents with neck pain and right arm pain . The symptoms have improved tremendously since her last visit, after increasing her gabapentin slowly to 300mg  tid , and prescribing Robaxin and Mobic. Now the patient complains about mild pain , which radiates into her entire right arm in a non radicular pattern. Patient also complains about numbness and tingling in the same distribution, mainly in her fingers . She describes the pain as throbing . Applying heat, taking medications , changing positions alleviate the symptoms. Prolonged staying in one position aggrevates the symptoms. The patient grades her pain as a 7/10.  Patient has been  following up with a cardiologist regularly.  Pain Inventory Average Pain 7 Pain Right Now 7 My pain is sharp and tingling  In the last 24 hours, has pain interfered with the following? General activity 8 Relation with others 7 Enjoyment of life 8 What TIME of day is your pain at its worst? varies Sleep (in general) Fair  Pain is worse with: some activites Pain improves with: medication Relief from Meds: 6  Mobility use a cane use a walker how many minutes can you walk? 2-3 ability to climb steps?  no do you drive?  no needs help with transfers Do you have any goals in this area?  yes  Function retired I need assistance with the following:  bathing, meal prep, household duties and shopping Do you have any goals in this area?  yes  Neuro/Psych weakness numbness tremor tingling trouble walking dizziness depression anxiety  Prior Studies x-rays CT/MRI  Physicians involved in your care Any changes since last visit?  no   Family History  Problem Relation Age of Onset  . Stroke Mother   . Breast cancer Sister   . Emphysema Sister   . Cancer Sister     breast  . Alcohol abuse Brother   .  Colon cancer      uncles x 2  . Prostate cancer Brother    History   Social History  . Marital Status: Married    Spouse Name: N/A    Number of Children: 0  . Years of Education: N/A   Occupational History  . Retired Insurance account manager for [employer]    Social History Main Topics  . Smoking status: Former Smoker -- 1.00 packs/day for 20 years    Types: Cigarettes  . Smokeless tobacco: Never Used     Comment: Still smokes a little.  . Alcohol Use: No  . Drug Use: No  . Sexually Active: Not Currently    Birth Control/ Protection: Surgical   Other Topics Concern  . None   Social History Narrative  . None   Past Surgical History  Procedure Laterality Date  . Uvuloplasty    . Total abdominal hysterectomy w/ bilateral salpingoophorectomy    . Abdominal hysterectomy    . Cholecystectomy    . Coronary angioplasty with stent placement  2002   Past Medical History  Diagnosis Date  . DM   . DYSLIPIDEMIA   . OBESITY NOS   . DEPRESSION, RECURRENT, IN PARTIAL REMISSION   . HYPERTENSION   . CORONARY ARTERY DISEASE     S/p Q wave MI 2002 with RCA stent and AngioJet   Cath 4/30 99% distal LAD stenosis unchanged from previous, 20% left main  stenosis, patent stents  . CONGESTIVE HEART FAILURE     EF 40-45% echo 05/2011  . GERD   . DIVERTICULOSIS, COLON, WITH HEMORRHAGE   . BACK PAIN, LUMBAR, WITH RADICULOPATHY   . OBSTRUCTIVE SLEEP APNEA   . Asthma   . Anemia, iron deficiency   . Hyponatremia 12/2011  . Tobacco abuse   . Moderate COPD (chronic obstructive pulmonary disease) 11/07/2005    Spirometry 03/09/11>>FEV1 2.02 (88%), FEV1% 76     BP 127/73  Pulse 81  Resp 14  Ht 5\' 9"  (1.753 m)  Wt 161 lb (73.029 kg)  BMI 23.76 kg/m2  SpO2 91%     Review of Systems  Constitutional: Positive for unexpected weight change.  HENT: Positive for neck pain.   Respiratory: Positive for apnea, shortness of breath and wheezing.   Gastrointestinal: Positive for constipation.   Musculoskeletal: Positive for gait problem.  Neurological: Positive for dizziness, tremors, weakness and numbness.  Psychiatric/Behavioral: Positive for dysphoric mood. The patient is nervous/anxious.   All other systems reviewed and are negative.       Objective:   Physical Exam Constitutional: She is oriented to person, place, and time. She appears well-developed and well-nourished.  Walks with a cane  HENT:  Head: Normocephalic.  Neck: Neck supple.  Musculoskeletal: She exhibits tenderness.  Neurological: She is alert and oriented to person, place, and time.  Skin: Skin is warm and dry.  Psychiatric: She has a normal mood and affect.  Symmetric normal motor tone is noted throughout, except increased tone in neck muscles bilateral. Normal muscle bulk. Muscle testing reveals 5/5 muscle strength of the upper extremity, and 5/5 of the lower extremity. Full range of motion in upper and lower extremities. ROM of C-spine is mildly restricted. Fine motor movements are normal in both hands.  Sensory is intact and symmetric to light touch, pinprick and proprioception.  DTR in the upper and lower extremity are present and symmetric 1+. No clonus is noted.  Patient arises from chair with mild difficulty. Narrow based gait with with a cane.         Assessment & Plan:  MRI cervical 03/02/11  1. Cervical spondylosis and degenerative disc disease results in  varying degrees of foraminal impingement at all levels between C2 and  C7. Radiating pain and numbness in a non-radicular pattern.  2. MRI results, Despite efforts by the patient and technologist, motion  artifact is present on some series of today's examination and could  not be totally eliminated. This reduces diagnostic sensitivity and  Specificity.  PLAN  Advised patient to rest her head, also to keep her head in a good posture. Ordered PT for pain relief.  Decrease Robaxin 500mg  from tid to prn, d/c Mobic 7.5 at last visit, which  increased her pain some, cardiologist does not want her to be on an anti-inflammatory, advised patient to ask whether she is allowed to use Voltaren gel , continue the Gabapentin 300mg  tid if tolerated.

## 2012-03-13 NOTE — Patient Instructions (Signed)
Apply heat to your neck, try to keep a good posture. Please ask your cardiologist whether you could use Voltaren gel for your pain.

## 2012-03-14 ENCOUNTER — Encounter: Payer: Self-pay | Admitting: Pulmonary Disease

## 2012-03-18 ENCOUNTER — Other Ambulatory Visit: Payer: Self-pay

## 2012-03-18 MED ORDER — SITAGLIPTIN PHOS-METFORMIN HCL 50-500 MG PO TABS: 1.0000 | ORAL_TABLET | Freq: Two times a day (BID) | ORAL | Status: DC

## 2012-03-21 ENCOUNTER — Encounter: Payer: Self-pay | Admitting: Pulmonary Disease

## 2012-03-21 ENCOUNTER — Telehealth: Payer: Self-pay | Admitting: *Deleted

## 2012-03-21 NOTE — Telephone Encounter (Signed)
"  Tell Su Monks PA that I will not be going to therapy for my neck because it is much better."

## 2012-03-22 NOTE — Telephone Encounter (Signed)
Ok, good to hear

## 2012-03-25 ENCOUNTER — Encounter: Payer: Self-pay | Admitting: Internal Medicine

## 2012-03-25 ENCOUNTER — Ambulatory Visit (INDEPENDENT_AMBULATORY_CARE_PROVIDER_SITE_OTHER)
Admission: RE | Admit: 2012-03-25 | Discharge: 2012-03-25 | Disposition: A | Discharge status: Home / Self Care | Payer: Medicare Other | Source: Ambulatory Visit | Attending: Internal Medicine | Admitting: Internal Medicine

## 2012-03-25 ENCOUNTER — Telehealth: Payer: Self-pay | Admitting: Internal Medicine

## 2012-03-25 ENCOUNTER — Ambulatory Visit (INDEPENDENT_AMBULATORY_CARE_PROVIDER_SITE_OTHER): Payer: Medicare Other | Admitting: Internal Medicine

## 2012-03-25 ENCOUNTER — Ambulatory Visit: Payer: Medicare Other

## 2012-03-25 ENCOUNTER — Other Ambulatory Visit (INDEPENDENT_AMBULATORY_CARE_PROVIDER_SITE_OTHER): Payer: Medicare Other

## 2012-03-25 VITALS — BP 138/80 | HR 82 | Temp 97.5°F | Resp 16 | Wt 160.1 lb

## 2012-03-25 DIAGNOSIS — D509 Iron deficiency anemia, unspecified: Secondary | ICD-10-CM

## 2012-03-25 DIAGNOSIS — R9389 Abnormal findings on diagnostic imaging of other specified body structures: Secondary | ICD-10-CM

## 2012-03-25 DIAGNOSIS — R918 Other nonspecific abnormal finding of lung field: Secondary | ICD-10-CM

## 2012-03-25 DIAGNOSIS — R05 Cough: Secondary | ICD-10-CM | POA: Insufficient documentation

## 2012-03-25 DIAGNOSIS — I10 Essential (primary) hypertension: Secondary | ICD-10-CM

## 2012-03-25 DIAGNOSIS — IMO0001 Reserved for inherently not codable concepts without codable children: Secondary | ICD-10-CM

## 2012-03-25 DIAGNOSIS — R059 Cough, unspecified: Secondary | ICD-10-CM

## 2012-03-25 DIAGNOSIS — J209 Acute bronchitis, unspecified: Secondary | ICD-10-CM | POA: Insufficient documentation

## 2012-03-25 LAB — CBC WITH DIFFERENTIAL/PLATELET
Eosinophils Relative: 3.5 % (ref 0.0–5.0)
HCT: 29 % — ABNORMAL LOW (ref 36.0–46.0)
Hemoglobin: 9.7 g/dL — ABNORMAL LOW (ref 12.0–15.0)
Lymphs Abs: 0.8 10*3/uL (ref 0.7–4.0)
MCV: 92.4 fl (ref 78.0–100.0)
Monocytes Absolute: 0.2 10*3/uL (ref 0.1–1.0)
Neutro Abs: 1.9 10*3/uL (ref 1.4–7.7)
Platelets: 285 10*3/uL (ref 150.0–400.0)
RDW: 18.4 % — ABNORMAL HIGH (ref 11.5–14.6)

## 2012-03-25 LAB — COMPREHENSIVE METABOLIC PANEL
Alkaline Phosphatase: 75 U/L (ref 39–117)
Creatinine, Ser: 1.2 mg/dL (ref 0.4–1.2)
GFR: 59.71 mL/min — ABNORMAL LOW (ref >60.00)
Glucose, Bld: 110 mg/dL — ABNORMAL HIGH (ref 70–99)
Sodium: 136 mEq/L (ref 135–145)
Total Bilirubin: 0.6 mg/dL (ref 0.3–1.2)
Total Protein: 6.5 g/dL (ref 6.0–8.3)

## 2012-03-25 LAB — IBC PANEL: Transferrin: 230.9 mg/dL (ref 212.0–360.0)

## 2012-03-25 MED ORDER — PROMETHAZINE-DM 6.25-15 MG/5ML PO SYRP: 5.0000 mL | ORAL_SOLUTION | Freq: Four times a day (QID) | ORAL | Status: DC | PRN

## 2012-03-25 MED ORDER — AZITHROMYCIN 500 MG PO TABS: 500.0000 mg | ORAL_TABLET | Freq: Every day | ORAL | Status: DC

## 2012-03-25 NOTE — Assessment & Plan Note (Signed)
I will recheck her A1C and will address if needed 

## 2012-03-25 NOTE — Assessment & Plan Note (Signed)
Start Zpak for the infection and a cough suppressant 

## 2012-03-25 NOTE — Assessment & Plan Note (Signed)
I will recheck her CBC today 

## 2012-03-25 NOTE — Telephone Encounter (Signed)
Patient Information:  Caller Name: [name]  Phone: [phone]  Patient: [name], [name]  Gender: Female  DOB: [birth year]  Age: 67 Years  PCP: [name] (Adults only)  Office Follow Up:  Does the office need to follow up with this patient?: Yes  Instructions For The Office: OFFICE PLEASE FOLLOW UP WITH PATIENT IF RX CAN BE CALLED IN FOR HER  RN Note:  pt is using home oxygen (2 L)  Symptoms  Reason For Call & Symptoms: pt is calling for an antibiotic; pt reports she has a sore throat, sneezing and coughing  Reviewed Health History In EMR: Yes  Reviewed Medications In EMR: Yes  Reviewed Allergies In EMR: Yes  Reviewed Surgeries / Procedures: Yes  Date of Onset of Symptoms: 03/22/2012  Guideline(s) Used:  Colds  Disposition Per Guideline:   Home Care  Reason For Disposition Reached:   Colds with no complications  Advice Given:  For a Stuffy Nose - Use Nasal Washes:  Introduction: Saline (salt water) nasal irrigation (nasal wash) is an effective and simple home remedy for treating stuffy nose and sinus congestion. The nose can be irrigated by pouring, spraying, or squirting salt water into the nose and then letting it run back out.  How it Helps: The salt water rinses out excess mucus, washes out any irritants (dust, allergens) that might be present, and moistens the nasal cavity.  Methods: There are several ways to perform nasal irrigation. You can use a saline nasal spray bottle (available over-the-counter), a rubber ear syringe, a medical syringe without the needle, or a Neti Pot.  Humidifier:  If the air in your home is dry, use a cool-mist humidifier  Call Back If:  Difficulty breathing occurs  You become worse  Patient Refused Recommendation:  Patient Requests Prescription  The patient is requesting an antibiotic.  There was  no openings for Dr Yetta Barre to see patient today

## 2012-03-25 NOTE — Telephone Encounter (Signed)
Per MD ok to double book afternoon schedule for appointment. Patient will need appt in order to determine antibiotics.

## 2012-03-25 NOTE — Assessment & Plan Note (Signed)
Will recheck her CXR and advise further

## 2012-03-25 NOTE — Assessment & Plan Note (Signed)
Her BP is well controlled Today I will check her lytes and renal function 

## 2012-03-25 NOTE — Patient Instructions (Signed)

## 2012-03-25 NOTE — Assessment & Plan Note (Signed)
Repeat CXR today 

## 2012-03-25 NOTE — Progress Notes (Signed)
Subjective:    Patient ID: [name], female    DOB: June 24, 1945, 67 y.o.   MRN: [id number]  Cough This is a new problem. Episode onset: 3 days ago. The problem has been unchanged. The problem occurs every few hours. The cough is productive of purulent sputum. Associated symptoms include chills and sweats. Pertinent negatives include no chest pain, ear congestion, ear pain, fever, headaches, heartburn, hemoptysis, myalgias, nasal congestion, postnasal drip, rash, rhinorrhea, sore throat, shortness of breath, weight loss or wheezing. Nothing aggravates the symptoms. She has tried a beta-agonist inhaler and ipratropium inhaler for the symptoms. The treatment provided mild relief. Her past medical history is significant for bronchitis, COPD and pneumonia. There is no history of asthma, bronchiectasis, emphysema or environmental allergies.      Review of Systems  Constitutional: Positive for chills. Negative for fever, weight loss, diaphoresis, activity change, appetite change, fatigue and unexpected weight change.  HENT: Negative.  Negative for ear pain, sore throat, rhinorrhea and postnasal drip.   Eyes: Negative.   Respiratory: Positive for cough. Negative for apnea, hemoptysis, choking, chest tightness, shortness of breath, wheezing and stridor.   Cardiovascular: Negative.  Negative for chest pain, palpitations and leg swelling.  Gastrointestinal: Negative.  Negative for heartburn, nausea, vomiting, abdominal pain, diarrhea and constipation.  Endocrine: Negative.   Genitourinary: Negative.   Musculoskeletal: Negative.  Negative for myalgias, back pain, joint swelling and gait problem.  Skin: Negative for color change, pallor, rash and wound.  Allergic/Immunologic: Negative.  Negative for environmental allergies.  Neurological: Negative.  Negative for dizziness, weakness, light-headedness, numbness and headaches.  Hematological: Negative.  Negative for adenopathy. Does not bruise/bleed  easily.  Psychiatric/Behavioral: Negative.        Objective:   Physical Exam  Vitals reviewed. Constitutional: She is oriented to person, place, and time. She appears well-developed and well-nourished.  Non-toxic appearance. She does not have a sickly appearance. She does not appear ill. No distress.  HENT:  Head: Normocephalic and atraumatic. No trismus in the jaw.  Nose: No mucosal edema or rhinorrhea. Right sinus exhibits no maxillary sinus tenderness and no frontal sinus tenderness. Left sinus exhibits no maxillary sinus tenderness and no frontal sinus tenderness.  Mouth/Throat: Oropharynx is clear and moist and mucous membranes are normal. Mucous membranes are not pale, not dry and not cyanotic. No oral lesions. No edematous. No oropharyngeal exudate, posterior oropharyngeal edema, posterior oropharyngeal erythema or tonsillar abscesses.  Eyes: Conjunctivae are normal. Right eye exhibits no discharge. Left eye exhibits no discharge. No scleral icterus.  Neck: Normal range of motion. Neck supple. No JVD present. No tracheal deviation present. No thyromegaly present.  Cardiovascular: Normal rate, regular rhythm, normal heart sounds and intact distal pulses.  Exam reveals no gallop and no friction rub.   No murmur heard. Pulmonary/Chest: Effort normal and breath sounds normal. No stridor. No respiratory distress. She has no wheezes. She has no rales. She exhibits no tenderness.  Abdominal: Soft. Bowel sounds are normal. She exhibits no distension and no mass. There is no tenderness. There is no rebound and no guarding.  Musculoskeletal: Normal range of motion. She exhibits no edema and no tenderness.  Lymphadenopathy:    She has no cervical adenopathy.  Neurological: She is oriented to person, place, and time.  Skin: Skin is warm and dry. No rash noted. She is not diaphoretic. No erythema. No pallor.  Psychiatric: She has a normal mood and affect. Her behavior is normal. Judgment and  thought content normal.  Lab Results  Component Value Date   WBC 4.9 12/22/2011   HGB 9.8* 12/22/2011   HCT 29.8* 12/22/2011   PLT 310.0 12/22/2011   GLUCOSE 135* 01/17/2012   CHOL 120 12/07/2011   TRIG 83 12/07/2011   HDL 27* 12/07/2011   LDLCALC 76 12/07/2011   ALT 11 12/06/2011   AST 19 12/06/2011   NA 139 01/17/2012   K 4.2 01/17/2012   CL 99 01/17/2012   CREATININE 1.1 01/17/2012   BUN 9 01/17/2012   CO2 34* 01/17/2012   TSH 3.650 12/05/2011   INR 1.34 12/05/2011   HGBA1C 7.4* 12/05/2011   MICROALBUR 2.45* 03/15/2007       Assessment & Plan:

## 2012-03-25 NOTE — Telephone Encounter (Signed)
APPT today at 2:00.

## 2012-03-28 ENCOUNTER — Telehealth: Payer: Self-pay

## 2012-03-28 DIAGNOSIS — D509 Iron deficiency anemia, unspecified: Secondary | ICD-10-CM

## 2012-03-28 NOTE — Telephone Encounter (Signed)
Pt advised and will expect a call from [facility] with appt info 

## 2012-03-28 NOTE — Telephone Encounter (Signed)
I will order a referral to hematology to consider getting an iron injection

## 2012-03-28 NOTE — Telephone Encounter (Signed)
Pt advised of lab result and states that she is taking all medications as prescribed. Pt is requesting MD advise if there are any further recommendations regarding anemia and low iron levels.

## 2012-03-29 ENCOUNTER — Telehealth: Payer: Self-pay

## 2012-03-29 ENCOUNTER — Telehealth: Payer: Self-pay | Admitting: Internal Medicine

## 2012-03-29 NOTE — Telephone Encounter (Signed)
S/W PT IN RE NP APPT 04/15 @ 9:30 W/DR. MIOHAMED REFERRING DR. [name] DX- IDA WELCOME PACKET MAILED.

## 2012-03-29 NOTE — Telephone Encounter (Signed)
Pt called to report that she fainted int he shower this morning and fell. Pt did not report any injuries via triage VM. Left message on machine for pt to return my call

## 2012-03-29 NOTE — Telephone Encounter (Signed)
She needs to be seen in the ER.

## 2012-03-29 NOTE — Telephone Encounter (Signed)
Pt states that her husband found her after a few minutes of her laying in the shower. [name] knees are bruised and swollen, LT worse than RT with a bruise the size of half-dollar. Pt has been putting ice on her knee on and off since incident but is requesting MD advise if any further care is needed.

## 2012-04-01 ENCOUNTER — Telehealth: Payer: Self-pay | Admitting: Internal Medicine

## 2012-04-01 NOTE — Telephone Encounter (Signed)
C/D 04/01/12 for appt. 04/16/12

## 2012-04-01 NOTE — Telephone Encounter (Signed)
Pt states that she is still mildly sore but much improved. Pt declined ER but will monitor sxs.

## 2012-04-16 ENCOUNTER — Telehealth: Payer: Self-pay | Admitting: Internal Medicine

## 2012-04-16 ENCOUNTER — Encounter: Payer: Self-pay | Admitting: Internal Medicine

## 2012-04-16 ENCOUNTER — Other Ambulatory Visit (HOSPITAL_BASED_OUTPATIENT_CLINIC_OR_DEPARTMENT_OTHER): Payer: Medicare Other | Admitting: Lab

## 2012-04-16 ENCOUNTER — Ambulatory Visit (HOSPITAL_BASED_OUTPATIENT_CLINIC_OR_DEPARTMENT_OTHER): Payer: Medicare Other | Admitting: Internal Medicine

## 2012-04-16 ENCOUNTER — Telehealth: Payer: Self-pay | Admitting: *Deleted

## 2012-04-16 ENCOUNTER — Ambulatory Visit: Payer: Medicare Other

## 2012-04-16 VITALS — BP 126/80 | HR 73 | Temp 97.8°F | Resp 18 | Ht 69.0 in | Wt 158.5 lb

## 2012-04-16 DIAGNOSIS — D509 Iron deficiency anemia, unspecified: Secondary | ICD-10-CM

## 2012-04-16 DIAGNOSIS — D539 Nutritional anemia, unspecified: Secondary | ICD-10-CM

## 2012-04-16 LAB — CBC & DIFF AND RETIC
BASO%: 0.3 % (ref 0.0–2.0)
Eosinophils Absolute: 0.1 10*3/uL (ref 0.0–0.5)
LYMPH%: 30.5 % (ref 14.0–49.7)
MCHC: 32.2 g/dL (ref 31.5–36.0)
MONO#: 0.2 10*3/uL (ref 0.1–0.9)
MONO%: 5.1 % (ref 0.0–14.0)
NEUT#: 2.2 10*3/uL (ref 1.5–6.5)
Platelets: 235 10*3/uL (ref 145–400)
RBC: 3.24 10*6/uL — ABNORMAL LOW (ref 3.70–5.45)
RDW: 17.1 % — ABNORMAL HIGH (ref 11.2–14.5)
Retic %: 1.29 % (ref 0.70–2.10)
Retic Ct Abs: 41.8 10*3/uL (ref 33.70–90.70)
WBC: 3.7 10*3/uL — ABNORMAL LOW (ref 3.9–10.3)

## 2012-04-16 LAB — COMPREHENSIVE METABOLIC PANEL (CC13)
AST: 13 U/L (ref 5–34)
Alkaline Phosphatase: 88 U/L (ref 40–150)
BUN: 9.8 mg/dL (ref 7.0–26.0)
Glucose: 100 mg/dl — ABNORMAL HIGH (ref 70–99)
Sodium: 138 mEq/L (ref 136–145)
Total Bilirubin: 0.48 mg/dL (ref 0.20–1.20)

## 2012-04-16 NOTE — Progress Notes (Signed)
Checked in new pt with no financial concerns. °

## 2012-04-16 NOTE — Telephone Encounter (Signed)
Per staff phone call and POF I have schedueld appts.  JMW  

## 2012-04-16 NOTE — Telephone Encounter (Signed)
gv and printed appt schedule and avs for pt....michelled add iron for pt.

## 2012-04-16 NOTE — Progress Notes (Signed)
[facility] [phone]   [phone]  CONSULT NOTE  REFERRING PHYSICIAN: Dr. [name]  REASON FOR CONSULTATION:  67 years old African American female with persistent iron deficiency anemia.  HPI [name] is a 67 y.o. female was past medical history significant for multiple medical problems including hypertension, dyslipidemia, diabetes mellitus, depression, history of coronary arteries disease, history of congestive heart failure, obstructive sleep apnea, COPD and GERD. The patient has been complaining of persistent anemia for several years. She was followed initially by Dr. Victorino Dike and had a colonoscopy 2 years ago by Dr. Rob Bunting that according to the patient was unremarkable except for small benefits that were removed. The patient mentions that she lost around 90 pounds over the last several months but she relates it to diuresis of fluid overload secondary to her congestive heart failure. She continues to complain of epigastric pain and fatigue. She also has cough productive of yellowish sputum. She is currently on oral iron tablets in the form of ferrous sulfate twice a day and she had dark stool with occasional mucus. She denied having any other significant complaints. Her hemoglobin has been in the range of 9.0 - 10.0 g/dL over the last several months. She denied having any significant bleeding issues, bruises or ecchymosis. She does not remember having any upper endoscopy performed. The patient denied having any significant chest pain, shortness of breath but continues to have cough productive of clear sputum and no hemoptysis.  @SFHPI @  Past Medical History  Diagnosis Date  . DM   . DYSLIPIDEMIA   . OBESITY NOS   . DEPRESSION, RECURRENT, IN PARTIAL REMISSION   . HYPERTENSION   . CORONARY ARTERY DISEASE     S/p Q wave MI 2002 with RCA stent and AngioJet   Cath 4/30 99% distal LAD stenosis unchanged from previous, 20%  left main stenosis, patent stents  . CONGESTIVE HEART FAILURE     EF 40-45% echo 05/2011  . GERD   . DIVERTICULOSIS, COLON, WITH HEMORRHAGE   . BACK PAIN, LUMBAR, WITH RADICULOPATHY   . OBSTRUCTIVE SLEEP APNEA   . Asthma   . Anemia, iron deficiency   . Hyponatremia 12/2011  . Tobacco abuse   . Moderate COPD (chronic obstructive pulmonary disease) 11/07/2005    Spirometry 03/09/11>>FEV1 2.02 (88%), FEV1% 76      Past Surgical History  Procedure Laterality Date  . Uvuloplasty    . Total abdominal hysterectomy w/ bilateral salpingoophorectomy    . Abdominal hysterectomy    . Cholecystectomy    . Coronary angioplasty with stent placement  2002    Family History  Problem Relation Age of Onset  . Stroke Mother   . Breast cancer Sister   . Emphysema Sister   . Cancer Sister     breast  . Alcohol abuse Brother   . Colon cancer      uncles x 2  . Prostate cancer Brother     Social History History  Substance Use Topics  . Smoking status: Former Smoker -- 1.00 packs/day for 20 years    Types: Cigarettes  . Smokeless tobacco: Never Used     Comment: Still smokes a little.  . Alcohol Use: No    Allergies  Allergen Reactions  . Amlodipine Other (See Comments)    constipation  . Ramipril Cough  . Adhesive (Tape) Hives    "peels skin off"  . Penicillins Itching  . Codeine Rash  Current Outpatient Prescriptions  Medication Sig Dispense Refill  . acetaminophen (TYLENOL) 500 MG tablet Take 500 mg by mouth every 6 (six) hours as needed for pain.      [name] albuterol (PROVENTIL) (2.5 MG/3ML) 0.083% nebulizer solution Take 3 mLs (2.5 mg total) by nebulization every 6 (six) hours as needed for wheezing.  75 mL  12  . aspirin 81 MG tablet Take 81 mg by mouth daily.        [name] atorvastatin (LIPITOR) 40 MG tablet Take 1 tablet (40 mg total) by mouth daily.  90 tablet  3  . carvedilol (COREG) 25 MG tablet Take 1.5 tablets (37.5 mg total) by mouth 2 (two) times daily with a meal. Take  one and one half tablet twice daily. PT WANTS BRAND ONLY  90 tablet  11  . ferrous fumarate (FERRETTS) 325 (106 FE) MG TABS Take 1 tablet (106 mg of iron total) by mouth 2 (two) times daily.  60 tablet  11  . furosemide (LASIX) 80 MG tablet Take 1 tablet (80 mg total) by mouth 2 (two) times daily.  60 tablet  6  . Gabapentin, PHN, 300 MG TABS Take 1 tablet by mouth 3 (three) times daily.  90 tablet  2  . hydrALAZINE (APRESOLINE) 25 MG tablet Take 1 tablet (25 mg total) by mouth every 8 (eight) hours.  90 tablet  6  . insulin aspart (NOVOLOG) 100 UNIT/ML injection Inject 10-15 Units into the skin 3 (three) times daily before meals. Sliding scale.    15 units at breakfast, 10units at lunch,10 units at dinner      . methocarbamol (ROBAXIN) 500 MG tablet Take 1 tablet (500 mg total) by mouth 2 (two) times daily as needed.  60 tablet  1  . olmesartan (BENICAR) 40 MG tablet Take 1 tablet (40 mg total) by mouth daily.  30 tablet  1  . pantoprazole (PROTONIX) 40 MG tablet Take 40 mg by mouth 2 (two) times daily.      . Polyethylene Glycol 3350 (MIRALAX PO) Take 17 g by mouth daily. As directed       . potassium chloride (MICRO-K) 10 MEQ CR capsule Take 2 capsules (20 mEq total) by mouth 2 (two) times daily.  120 capsule  6  . sitaGLIPtan-metformin (JANUMET) 50-500 MG per tablet Take 1 tablet by mouth 2 (two) times daily with a meal.  60 tablet  0  . tiotropium (SPIRIVA) 18 MCG inhalation capsule Place 1 capsule (18 mcg total) into inhaler and inhale daily.  30 capsule  5  . traZODone (DESYREL) 100 MG tablet Take 150 mg by mouth at bedtime. 1 and 1/2 pills at hs      . venlafaxine (EFFEXOR XR) 150 MG 24 hr capsule Take 150 mg by mouth 2 (two) times daily.        . isosorbide mononitrate (IMDUR) 30 MG 24 hr tablet Take 30 mg by mouth daily.      . [DISCONTINUED] budesonide-formoterol (SYMBICORT) 80-4.5 MCG/ACT inhaler Inhale 2 puffs into the lungs 2 (two) times daily.  1 Inhaler  12   No current  facility-administered medications for this visit.    Review of Systems  A comprehensive review of systems was negative except for: Constitutional: positive for fatigue and weight loss Respiratory: positive for cough  Physical Exam  JXB:JYNWG, healthy, no distress, well nourished and well developed SKIN: skin color, texture, turgor are normal HEAD: Normocephalic, No masses, lesions, tenderness or abnormalities EYES: normal, PERRLA  EARS: External ears normal OROPHARYNX:no exudate and no erythema  NECK: supple, no adenopathy LYMPH:  no palpable lymphadenopathy, no hepatosplenomegaly BREAST:not examined LUNGS: clear to auscultation  HEART: regular rate & rhythm and no murmurs ABDOMEN:abdomen soft, non-tender, normal bowel sounds and no masses or organomegaly BACK: Back symmetric, no curvature. EXTREMITIES:no joint deformities, effusion, or inflammation, no edema, no skin discoloration  NEURO: alert & oriented x 3 with fluent speech, no focal motor/sensory deficits  PERFORMANCE STATUS: ECOG 1  LABORATORY DATA: Lab Results  Component Value Date   WBC 3.7* 04/16/2012   HGB 9.6* 04/16/2012   HCT 29.8* 04/16/2012   MCV 92.0 04/16/2012   PLT 235 04/16/2012      Chemistry      Component Value Date/Time   NA 136 03/25/2012 1501   K 3.8 03/25/2012 1501   CL 96 03/25/2012 1501   CO2 31 03/25/2012 1501   BUN 10 03/25/2012 1501   CREATININE 1.2 03/25/2012 1501      Component Value Date/Time   CALCIUM 8.6 03/25/2012 1501   ALKPHOS 75 03/25/2012 1501   AST 14 03/25/2012 1501   ALT 10 03/25/2012 1501   BILITOT 0.6 03/25/2012 1501       RADIOGRAPHIC STUDIES: Dg Chest 2 View  03/25/2012  *RADIOLOGY REPORT*  Clinical Data: Cough.  Short of breath.  CHEST - 2 VIEW  Comparison: 01/01/2012  Findings: The heart is moderately enlarged.  Vascular congestion. Small right pleural effusion is improved compared the prior study. Hazy airspace disease at the lung bases worrisome for edema.  No  pneumothorax. Right C7 cervical rib.  IMPRESSION: Mild CHF with mild basilar edema.  Small right pleural effusion.   Original Report Authenticated By: Jolaine Click, M.D.     ASSESSMENT: This is a very pleasant 67 years old African American female with persistent iron deficiency anemia most likely secondary to gastrointestinal blood loss but other etiologies of anemia cannot be ruled out at this point especially anemia of chronic disease.   PLAN: I have a lengthy discussion with the patient and her husband today about her disease condition and treatment options. I ordered several studies today to evaluate the etiology of her anemia including repeat CBC, comprehensive metabolic panel, reticulocyte count, erythropoietin, iron study and ferritin as well as serum protein electrophoreses. I will also order stool testing for Hemoccult. I recommended for the patient to proceed with Feraheme infusion 510 mg on weekly basis x2 doses. She will start the first dose on 04/26/2012. The patient would come back for followup visit in 2 months for reevaluation with repeat CBC and iron study. She was also advised to continue on the ferrous sulfate with the current dose. She was advised to call me immediately if she has any concerning symptoms in the interval. I gave the patient and her husband the time to ask questions and I answered them completely to their satisfactions. All questions were answered. The patient knows to call the clinic with any problems, questions or concerns. We can certainly see the patient much sooner if necessary.  Thank you so much for allowing me to participate in the care of [name]. I will continue to follow up the patient with you and assist in her care.  I spent 25 minutes counseling the patient face to face. The total time spent in the appointment was 50 minutes.  Lonny Eisen K. 04/16/2012, 10:12 AM

## 2012-04-16 NOTE — Patient Instructions (Signed)
You have persistent iron deficiency anemia questionable to be secondary to blood loss from the gastrointestinal tract. I ordered several studies today to evaluate the cause of your anemia. Will consider her for intravenous iron infusion. Followup in 2 months with repeat iron study

## 2012-04-17 ENCOUNTER — Telehealth: Payer: Self-pay | Admitting: Medical Oncology

## 2012-04-17 NOTE — Telephone Encounter (Signed)
requests results

## 2012-04-17 NOTE — Telephone Encounter (Signed)
I told pt her iron was low and the he ordered iron infusions which she knows about. I told her the other tests may not be back. She said she will keep looking on MyChart for results.

## 2012-04-18 LAB — FERRITIN: Ferritin: 49 ng/mL (ref 10–291)

## 2012-04-18 LAB — SPEP & IFE WITH QIG
Alpha-2-Globulin: 14.5 % — ABNORMAL HIGH (ref 7.1–11.8)
Gamma Globulin: 14.3 % (ref 11.1–18.8)
IgA: 295 mg/dL (ref 69–380)
IgG (Immunoglobin G), Serum: 1010 mg/dL (ref 690–1700)
IgM, Serum: 15 mg/dL — ABNORMAL LOW (ref 52–322)
Total Protein, Serum Electrophoresis: 6.2 g/dL (ref 6.0–8.3)

## 2012-04-22 ENCOUNTER — Ambulatory Visit (INDEPENDENT_AMBULATORY_CARE_PROVIDER_SITE_OTHER): Payer: Medicare Other | Admitting: Physician Assistant

## 2012-04-22 ENCOUNTER — Ambulatory Visit (INDEPENDENT_AMBULATORY_CARE_PROVIDER_SITE_OTHER): Payer: Medicare Other | Admitting: Internal Medicine

## 2012-04-22 ENCOUNTER — Encounter: Payer: Self-pay | Admitting: Internal Medicine

## 2012-04-22 ENCOUNTER — Encounter: Payer: Self-pay | Admitting: Lab

## 2012-04-22 ENCOUNTER — Encounter: Payer: Self-pay | Admitting: Physician Assistant

## 2012-04-22 ENCOUNTER — Encounter: Payer: Self-pay | Admitting: *Deleted

## 2012-04-22 ENCOUNTER — Ambulatory Visit (HOSPITAL_BASED_OUTPATIENT_CLINIC_OR_DEPARTMENT_OTHER): Payer: Medicare Other | Admitting: Lab

## 2012-04-22 VITALS — BP 132/60 | HR 52 | Temp 97.3°F | Resp 16 | Wt 156.0 lb

## 2012-04-22 VITALS — BP 184/74 | HR 53 | Ht 69.0 in | Wt 155.0 lb

## 2012-04-22 DIAGNOSIS — E785 Hyperlipidemia, unspecified: Secondary | ICD-10-CM

## 2012-04-22 DIAGNOSIS — D509 Iron deficiency anemia, unspecified: Secondary | ICD-10-CM

## 2012-04-22 DIAGNOSIS — I2510 Atherosclerotic heart disease of native coronary artery without angina pectoris: Secondary | ICD-10-CM

## 2012-04-22 DIAGNOSIS — I5042 Chronic combined systolic (congestive) and diastolic (congestive) heart failure: Secondary | ICD-10-CM

## 2012-04-22 DIAGNOSIS — F17200 Nicotine dependence, unspecified, uncomplicated: Secondary | ICD-10-CM

## 2012-04-22 DIAGNOSIS — F0390 Unspecified dementia without behavioral disturbance: Secondary | ICD-10-CM

## 2012-04-22 DIAGNOSIS — R55 Syncope and collapse: Secondary | ICD-10-CM

## 2012-04-22 DIAGNOSIS — I10 Essential (primary) hypertension: Secondary | ICD-10-CM

## 2012-04-22 DIAGNOSIS — D539 Nutritional anemia, unspecified: Secondary | ICD-10-CM

## 2012-04-22 DIAGNOSIS — IMO0002 Reserved for concepts with insufficient information to code with codable children: Secondary | ICD-10-CM

## 2012-04-22 LAB — FECAL OCCULT BLOOD, GUAIAC: Occult Blood: NEGATIVE

## 2012-04-22 NOTE — Patient Instructions (Signed)

## 2012-04-22 NOTE — Assessment & Plan Note (Signed)
She has achieved good blood sugar control She can stop using insulin

## 2012-04-22 NOTE — Assessment & Plan Note (Signed)
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

## 2012-04-22 NOTE — Progress Notes (Signed)
Subjective:    Patient ID: [name], female    DOB: [birth year], 67 y.o.   MRN: [id number]  Diabetes She presents for her follow-up diabetic visit. Her disease course has been improving. Hypoglycemia symptoms include confusion and nervousness/anxiousness. Associated symptoms include fatigue and weight loss. Pertinent negatives for diabetes include no blurred vision, no chest pain, no foot paresthesias, no foot ulcerations, no polydipsia, no polyphagia, no polyuria, no visual change and no weakness. There are no hypoglycemic complications. Symptoms are stable. Diabetic complications include heart disease. Current diabetic treatment includes oral agent (dual therapy). She is compliant with treatment all of the time. Her weight is decreasing steadily. She is following a generally healthy diet. Meal planning includes avoidance of concentrated sweets. She never participates in exercise. There is no change in her home blood glucose trend. An ACE inhibitor/angiotensin II receptor blocker is contraindicated. She does not see a podiatrist.Eye exam is current.      Review of Systems  Constitutional: Positive for weight loss, fatigue and unexpected weight change. Negative for fever, chills, diaphoresis, activity change and appetite change.  HENT: Negative.   Eyes: Negative.  Negative for blurred vision.  Respiratory: Negative.  Negative for cough, chest tightness, shortness of breath, wheezing and stridor.   Cardiovascular: Negative.  Negative for chest pain, palpitations and leg swelling.  Gastrointestinal: Negative.  Negative for nausea, vomiting, abdominal pain, diarrhea and constipation.  Endocrine: Negative.  Negative for polydipsia, polyphagia and polyuria.  Genitourinary: Negative.   Musculoskeletal: Positive for arthralgias. Negative for myalgias, back pain, joint swelling and gait problem.  Skin: Negative.   Allergic/Immunologic: Negative.   Neurological: Negative.  Negative for weakness.   Hematological: Negative.  Negative for adenopathy. Does not bruise/bleed easily.  Psychiatric/Behavioral: Positive for confusion, dysphoric mood and decreased concentration. Negative for suicidal ideas, hallucinations, behavioral problems, sleep disturbance and agitation. The patient is nervous/anxious. The patient is not hyperactive.        Objective:   Physical Exam  Vitals reviewed. Constitutional: She is oriented to person, place, and time. She appears well-developed and well-nourished. No distress.  HENT:  Head: Normocephalic and atraumatic.  Mouth/Throat: Oropharynx is clear and moist. No oropharyngeal exudate.  Eyes: Conjunctivae are normal. Right eye exhibits no discharge. Left eye exhibits no discharge. No scleral icterus.  Neck: Normal range of motion. Neck supple. No JVD present. No tracheal deviation present. No thyromegaly present.  Cardiovascular: Normal rate, regular rhythm, normal heart sounds and intact distal pulses.  Exam reveals no gallop and no friction rub.   No murmur heard. Pulmonary/Chest: Effort normal and breath sounds normal. No stridor. No respiratory distress. She has no wheezes. She has no rales. She exhibits no tenderness.  Abdominal: Soft. Bowel sounds are normal. She exhibits no distension and no mass. There is no tenderness. There is no rebound and no guarding.  Musculoskeletal: Normal range of motion. She exhibits no edema and no tenderness.  Lymphadenopathy:    She has no cervical adenopathy.  Neurological: She is oriented to person, place, and time.  Skin: Skin is warm and dry. No rash noted. She is not diaphoretic. No erythema. No pallor.  Psychiatric: Judgment and thought content normal. Her mood appears anxious. Her affect is not angry, not blunt, not labile and not inappropriate. Her speech is delayed and tangential. Her speech is not rapid and/or pressured and not slurred. She is slowed. She is not agitated, not aggressive, not hyperactive, not  withdrawn, not actively hallucinating and not combative. Thought content is  not paranoid and not delusional. Cognition and memory are impaired. She does not express impulsivity or inappropriate judgment. She does not exhibit a depressed mood. She expresses no homicidal and no suicidal ideation. She expresses no suicidal plans and no homicidal plans. She is communicative. She exhibits abnormal recent memory and abnormal remote memory. She is inattentive.     Lab Results  Component Value Date   WBC 3.7* 04/16/2012   HGB 9.6* 04/16/2012   HCT 29.8* 04/16/2012   PLT 235 04/16/2012   GLUCOSE 100* 04/16/2012   CHOL 120 12/07/2011   TRIG 83 12/07/2011   HDL 27* 12/07/2011   LDLCALC 76 12/07/2011   ALT 7 04/16/2012   AST 13 04/16/2012   NA 138 04/16/2012   K 4.0 04/16/2012   CL 98 04/16/2012   CREATININE 1.1 04/16/2012   BUN 9.8 04/16/2012   CO2 30* 04/16/2012   TSH 3.650 12/05/2011   INR 1.34 12/05/2011   HGBA1C 6.3 03/25/2012   MICROALBUR 2.45* 03/15/2007       Assessment & Plan:

## 2012-04-22 NOTE — Progress Notes (Signed)
[address], [address] [place], [place]  [address] Phone: [phone], Fax:  [phone]  Date:  04/22/2012   Name:  [name]   DOB:  [birth year]   MRN:  [id number]  PCP:  [name], MD  Primary Cardiologist:  Dr. [name]  Primary Electrophysiologist:  None    History of Present Illness: [name] is a 67 y.o. female who returns for follow up.  She has a hx of CAD, s/p MI in 2002 treated with RCA stent, DM2, HTN, HL, CHF, sleep apnea, GERD, asthma, tobacco abuse, iron deficiency anemia. LHC 4/13:  dLAD 99% unchanged from 2010 (too small for PCI), mid and distal RCA stents patent, pRCA 30% before stent, EF 35%. Echo 5/13: Mild LVH, indeterminate diastolic function, EF 40-45%, moderate to severe inferior and posterior HK, mild LAE, mild RAE. Medical therapy continued.  Admitted 12/2011 with a/c combined systolic and diastolic CHF.  I saw her in 01/2012. She was stable at that time.    She notes chronic dyspnea with exertion. She is NYHA class III. She notes occasional chest pain. Overall, this is stable without significant change. She notes that it is worse if she forgets her nitrates. She sleeps at an incline chronically. She denies PND. She has mild pedal edema without significant change. She has noted 2 episodes of syncope recently. Details are not clear. She was in the shower once and was awoken by her husband sometime later. There is another time when she was sitting on the commode and proceeded to pass out. She denies a prodrome. She denies associated chest pain or shortness of breath.  She does note some confusion upon awakening.  Labs (12/13):   K 4.2, Cr 1.3, ALT 11, proBNP 28602, LDL 76, Hgb 9.8, TSH 3.650 Labs (4/14):     K 4, Cr 1.1, ALT 7, Hgb 9.6   Wt Readings from Last 3 Encounters:  04/22/12 155 lb 3.2 oz (70.398 kg)  04/22/12 156 lb (70.761 kg)  04/16/12 158 lb 8 oz (71.895 kg)     Past Medical History  Diagnosis Date  . DM   .  DYSLIPIDEMIA   . OBESITY NOS   . DEPRESSION, RECURRENT, IN PARTIAL REMISSION   . HYPERTENSION   . 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 CONGESTIVE HEART FAILURE     EF 40-45% echo 05/2011  . GERD   . DIVERTICULOSIS, COLON, WITH HEMORRHAGE   . BACK PAIN, LUMBAR, WITH RADICULOPATHY   . OBSTRUCTIVE SLEEP APNEA   . Asthma   . Anemia, iron deficiency   . Hyponatremia 12/2011  . Tobacco abuse   . Moderate COPD (chronic obstructive pulmonary disease) 11/07/2005    Spirometry 03/09/11>>FEV1 2.02 (88%), FEV1% 76      Current Outpatient Prescriptions  Medication Sig Dispense Refill  . acetaminophen (TYLENOL) 500 MG tablet Take 500 mg by mouth every 6 (six) hours as needed for pain.      [name] albuterol (PROVENTIL) (2.5 MG/3ML) 0.083% nebulizer solution Take 3 mLs (2.5 mg total) by nebulization every 6 (six) hours as needed for wheezing.  75 mL  12  . aspirin 81 MG tablet Take 81 mg by mouth daily.        [name] atorvastatin (LIPITOR) 40 MG tablet Take 1 tablet (40 mg total) by mouth  daily.  90 tablet  3  . carvedilol (COREG) 25 MG tablet Take 1.5 tablets (37.5 mg total) by mouth 2 (two) times daily with a meal. Take one and one half tablet twice daily. PT WANTS BRAND ONLY  90 tablet  11  . ferrous fumarate (FERRETTS) 325 (106 FE) MG TABS Take 1 tablet (106 mg of iron total) by mouth 2 (two) times daily.  60 tablet  11  . furosemide (LASIX) 80 MG tablet Take 1 tablet (80 mg total) by mouth 2 (two) times daily.  60 tablet  6  . Gabapentin, PHN, 300 MG TABS Take 1 tablet by mouth 3 (three) times daily.  90 tablet  2  . hydrALAZINE (APRESOLINE) 25 MG tablet Take 1 tablet (25 mg total) by mouth every 8 (eight) hours.  90 tablet  6  . isosorbide mononitrate (IMDUR) 30 MG 24 hr tablet Take 30 mg by mouth daily.      . methocarbamol (ROBAXIN) 500 MG tablet Take 1 tablet (500  mg total) by mouth 2 (two) times daily as needed.  60 tablet  1  . olmesartan (BENICAR) 40 MG tablet Take 1 tablet (40 mg total) by mouth daily.  30 tablet  1  . pantoprazole (PROTONIX) 40 MG tablet Take 40 mg by mouth 2 (two) times daily.      . Polyethylene Glycol 3350 (MIRALAX PO) Take 17 g by mouth daily. As directed       . potassium chloride (MICRO-K) 10 MEQ CR capsule Take 2 capsules (20 mEq total) by mouth 2 (two) times daily.  120 capsule  6  . sitaGLIPtan-metformin (JANUMET) 50-500 MG per tablet Take 1 tablet by mouth 2 (two) times daily with a meal.  60 tablet  0  . tiotropium (SPIRIVA) 18 MCG inhalation capsule Place 1 capsule (18 mcg total) into inhaler and inhale daily.  30 capsule  5  . traZODone (DESYREL) 100 MG tablet Take 150 mg by mouth at bedtime. 1 and 1/2 pills at hs      . venlafaxine (EFFEXOR XR) 150 MG 24 hr capsule Take 150 mg by mouth 2 (two) times daily.        . [DISCONTINUED] budesonide-formoterol (SYMBICORT) 80-4.5 MCG/ACT inhaler Inhale 2 puffs into the lungs 2 (two) times daily.  1 Inhaler  12   No current facility-administered medications for this visit.    Allergies: Allergies  Allergen Reactions  . Amlodipine Other (See Comments)    constipation  . Ramipril Cough  . Adhesive (Tape) Hives    "peels skin off"  . Penicillins Itching  . Codeine Rash    Social History:  The patient  reports that she has quit smoking. Her smoking use included Cigarettes. She has a 20 pack-year smoking history. She has never used smokeless tobacco. She reports that she does not drink alcohol or use illicit drugs.   ROS:  Please see the history of present illness.      All other systems reviewed and negative.   PHYSICAL EXAM: VS:  BP 178/88  Pulse 66  Ht 5\' 9"  (1.753 m)  Wt 155 lb 3.2 oz (70.398 kg)  BMI 22.91 kg/m2  Filed Vitals:   04/22/12 1241 04/22/12 1242 04/22/12 1243 04/22/12 1246  BP: 184/78 160/72 164/76 184/74  Pulse: 75 63 53 53  Height:  5\' 9"  (1.753  m)  5\' 9"  (1.753 m)  Weight:  155 lb 3.2 oz (70.398 kg)  155 lb (70.308 kg)    Well  nourished, well developed, in no acute distress HEENT: normal Neck: no JVD Cardiac:  normal S1, S2; RRR; no murmur Lungs:  Decreased breath sounds bilaterally, no wheezing, rhonchi or rales Abd: soft, nontender, no hepatomegaly Ext: trace bilateral LE edema Skin: warm and dry Neuro:  CNs 2-12 intact, no focal abnormalities noted  EKG:  NSR, HR 66, lateral T-wave inversions, no significant change compared to prior tracing    ASSESSMENT AND PLAN:  1. Syncope:  Etiology not clear.  She has fairly stable chest pain.  ECG is unchanged.  BP does drop some with standing but does recover.  She was asymptomatic with orthostatic VS today.  I have given her a Rx for compression stockings.  Recent BMET and CBC was stable.  I will obtain an echo and and event monitor. 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  If her EF is down, she may need repeat cardiac cath.  No driving until seen in f/u. 2. Combined Systolic and Diastolic CHF:  Volume appears stable.  Recent BMET ok.  Continue current Rx.  3. CAD:  CP is stable.  I do not think a stress test is warranted at this time.  Continue ASA and statin. 4. Hypertension:  BPs have been controlled and BP was optimal at PCPs office today.  Continue current Rx and monitor.   5. Hyperlipidemia:  Continue statin.  6. Anemia:  Followed by PCP. 7. Tobacco Abuse:  She has started back smoking and knows she needs to quit. 8. Disposition:  F/u with Dr. [name] in 6 weeks.   Luna Glasgow, PA-C  11:47 AM 04/22/2012

## 2012-04-22 NOTE — Assessment & Plan Note (Signed)
This is being evaluated and treated by hematology

## 2012-04-22 NOTE — Patient Instructions (Addendum)
PLEASE FOLLOW UP WITH DR. HOCHREIN 4-6 WEEKS  YOU HAVE BEEN GIVEN A NOTE EXCUSING YOU FROM [facility]  Your physician has requested that you have an echocardiogram. Echocardiography is a painless test that uses sound waves to create images of your heart. It provides your doctor with information about the size and shape of your heart and how well your heart's chambers and valves are working. This procedure takes approximately one hour. There are no restrictions for this procedure.  Your physician has recommended that you wear an event monitor. Event monitors are medical devices that record the heart's electrical activity. Doctors most often [place] these monitors to diagnose arrhythmias. Arrhythmias are problems with the speed or rhythm of the heartbeat. The monitor is a small, portable device. You can wear one while you do your normal daily activities. This is usually used to diagnose what is causing palpitations/syncope (passing out).

## 2012-04-23 ENCOUNTER — Inpatient Hospital Stay (HOSPITAL_COMMUNITY)
Admission: EM | Admit: 2012-04-23 | Discharge: 2012-04-30 | DRG: 287 | Disposition: A | Discharge status: Home Health | Payer: Medicare Other | Attending: Cardiovascular Disease | Admitting: Cardiovascular Disease

## 2012-04-23 ENCOUNTER — Encounter (HOSPITAL_COMMUNITY): Payer: Self-pay | Admitting: Emergency Medicine

## 2012-04-23 ENCOUNTER — Ambulatory Visit (INDEPENDENT_AMBULATORY_CARE_PROVIDER_SITE_OTHER): Payer: Medicare Other | Admitting: Cardiovascular Disease

## 2012-04-23 ENCOUNTER — Ambulatory Visit (HOSPITAL_COMMUNITY): Payer: Medicare Other

## 2012-04-23 ENCOUNTER — Emergency Department (HOSPITAL_COMMUNITY): Payer: Medicare Other

## 2012-04-23 DIAGNOSIS — I5023 Acute on chronic systolic (congestive) heart failure: Secondary | ICD-10-CM

## 2012-04-23 DIAGNOSIS — I2510 Atherosclerotic heart disease of native coronary artery without angina pectoris: Secondary | ICD-10-CM

## 2012-04-23 DIAGNOSIS — R634 Abnormal weight loss: Secondary | ICD-10-CM | POA: Diagnosis present

## 2012-04-23 DIAGNOSIS — I5042 Chronic combined systolic (congestive) and diastolic (congestive) heart failure: Secondary | ICD-10-CM | POA: Diagnosis present

## 2012-04-23 DIAGNOSIS — R079 Chest pain, unspecified: Secondary | ICD-10-CM

## 2012-04-23 DIAGNOSIS — Z7982 Long term (current) use of aspirin: Secondary | ICD-10-CM

## 2012-04-23 DIAGNOSIS — I509 Heart failure, unspecified: Secondary | ICD-10-CM | POA: Diagnosis present

## 2012-04-23 DIAGNOSIS — G4733 Obstructive sleep apnea (adult) (pediatric): Secondary | ICD-10-CM | POA: Diagnosis present

## 2012-04-23 DIAGNOSIS — I200 Unstable angina: Secondary | ICD-10-CM | POA: Diagnosis present

## 2012-04-23 DIAGNOSIS — E1165 Type 2 diabetes mellitus with hyperglycemia: Secondary | ICD-10-CM | POA: Diagnosis present

## 2012-04-23 DIAGNOSIS — K219 Gastro-esophageal reflux disease without esophagitis: Secondary | ICD-10-CM | POA: Diagnosis present

## 2012-04-23 DIAGNOSIS — IMO0002 Reserved for concepts with insufficient information to code with codable children: Secondary | ICD-10-CM | POA: Diagnosis present

## 2012-04-23 DIAGNOSIS — R7989 Other specified abnormal findings of blood chemistry: Secondary | ICD-10-CM

## 2012-04-23 DIAGNOSIS — R55 Syncope and collapse: Secondary | ICD-10-CM | POA: Diagnosis present

## 2012-04-23 DIAGNOSIS — Z9861 Coronary angioplasty status: Secondary | ICD-10-CM

## 2012-04-23 DIAGNOSIS — I252 Old myocardial infarction: Secondary | ICD-10-CM

## 2012-04-23 DIAGNOSIS — J4489 Other specified chronic obstructive pulmonary disease: Secondary | ICD-10-CM | POA: Diagnosis present

## 2012-04-23 DIAGNOSIS — D509 Iron deficiency anemia, unspecified: Secondary | ICD-10-CM | POA: Diagnosis present

## 2012-04-23 DIAGNOSIS — E119 Type 2 diabetes mellitus without complications: Secondary | ICD-10-CM | POA: Diagnosis present

## 2012-04-23 DIAGNOSIS — I5022 Chronic systolic (congestive) heart failure: Secondary | ICD-10-CM | POA: Diagnosis present

## 2012-04-23 DIAGNOSIS — E785 Hyperlipidemia, unspecified: Secondary | ICD-10-CM | POA: Diagnosis present

## 2012-04-23 DIAGNOSIS — Z79899 Other long term (current) drug therapy: Secondary | ICD-10-CM

## 2012-04-23 DIAGNOSIS — Z87891 Personal history of nicotine dependence: Secondary | ICD-10-CM

## 2012-04-23 DIAGNOSIS — I10 Essential (primary) hypertension: Secondary | ICD-10-CM | POA: Diagnosis present

## 2012-04-23 DIAGNOSIS — I441 Atrioventricular block, second degree: Principal | ICD-10-CM | POA: Diagnosis present

## 2012-04-23 DIAGNOSIS — J449 Chronic obstructive pulmonary disease, unspecified: Secondary | ICD-10-CM | POA: Diagnosis present

## 2012-04-23 DIAGNOSIS — E440 Moderate protein-calorie malnutrition: Secondary | ICD-10-CM | POA: Diagnosis present

## 2012-04-23 LAB — CBC
HCT: 29 % — ABNORMAL LOW (ref 36.0–46.0)
Hemoglobin: 9.9 g/dL — ABNORMAL LOW (ref 12.0–15.0)
MCH: 30 pg (ref 26.0–34.0)
MCHC: 34.1 g/dL (ref 30.0–36.0)
RBC: 3.3 MIL/uL — ABNORMAL LOW (ref 3.87–5.11)

## 2012-04-23 LAB — GLUCOSE, CAPILLARY
Glucose-Capillary: 76 mg/dL (ref 70–99)
Glucose-Capillary: 95 mg/dL (ref 70–99)
Glucose-Capillary: 112 mg/dL — ABNORMAL HIGH (ref 70–99)

## 2012-04-23 LAB — BASIC METABOLIC PANEL
BUN: 12 mg/dL (ref 6–23)
CO2: 32 mEq/L (ref 19–32)
GFR calc non Af Amer: 59 mL/min — ABNORMAL LOW (ref >90)
Glucose, Bld: 81 mg/dL (ref 70–99)
Potassium: 3.4 mEq/L — ABNORMAL LOW (ref 3.5–5.1)
Sodium: 140 mEq/L (ref 135–145)

## 2012-04-23 LAB — TSH: TSH: 2.357 u[IU]/mL (ref 0.350–4.500)

## 2012-04-23 LAB — TROPONIN I
Troponin I: <0.3 ng/mL (ref <0.30)
Troponin I: <0.3 ng/mL (ref <0.30)

## 2012-04-23 LAB — POCT I-STAT TROPONIN I: Troponin i, poc: 0.01 ng/mL (ref 0.00–0.08)

## 2012-04-23 MED ORDER — ISOSORBIDE MONONITRATE ER 30 MG PO TB24
30.0000 mg | ORAL_TABLET | Freq: Every day | ORAL | Status: DC
  Administered 2012-04-23 – 2012-04-28 (×6): 30 mg via ORAL
  Filled 2012-04-23 (×8): qty 1

## 2012-04-23 MED ORDER — VENLAFAXINE HCL ER 150 MG PO CP24
150.0000 mg | ORAL_CAPSULE | Freq: Two times a day (BID) | ORAL | Status: DC
  Administered 2012-04-23 – 2012-04-30 (×14): 150 mg via ORAL
  Filled 2012-04-23 (×15): qty 1

## 2012-04-23 MED ORDER — ALBUTEROL SULFATE (5 MG/ML) 0.5% IN NEBU: 2.5000 mg | INHALATION_SOLUTION | Freq: Four times a day (QID) | RESPIRATORY_TRACT | Status: DC | PRN

## 2012-04-23 MED ORDER — ACETAMINOPHEN 500 MG PO TABS: 500.0000 mg | ORAL_TABLET | Freq: Four times a day (QID) | ORAL | Status: DC | PRN

## 2012-04-23 MED ORDER — NITROGLYCERIN 0.4 MG SL SUBL: 0.4000 mg | SUBLINGUAL_TABLET | SUBLINGUAL | Status: DC | PRN

## 2012-04-23 MED ORDER — ACETAMINOPHEN 325 MG PO TABS
650.0000 mg | ORAL_TABLET | ORAL | Status: DC | PRN
  Administered 2012-04-24 – 2012-04-29 (×2): 650 mg via ORAL
  Filled 2012-04-23 (×2): qty 2

## 2012-04-23 MED ORDER — HYDRALAZINE HCL 25 MG PO TABS
25.0000 mg | ORAL_TABLET | Freq: Three times a day (TID) | ORAL | Status: DC
  Administered 2012-04-23 – 2012-04-29 (×18): 25 mg via ORAL
  Filled 2012-04-23 (×22): qty 1

## 2012-04-23 MED ORDER — INSULIN ASPART 100 UNIT/ML ~~LOC~~ SOLN
0.0000 [IU] | Freq: Three times a day (TID) | SUBCUTANEOUS | Status: DC
  Administered 2012-04-24: 2 [IU] via SUBCUTANEOUS
  Administered 2012-04-24: 16:00:00 via SUBCUTANEOUS
  Administered 2012-04-24: 2 [IU] via SUBCUTANEOUS
  Administered 2012-04-25: 3 [IU] via SUBCUTANEOUS
  Administered 2012-04-26: 13:00:00 via SUBCUTANEOUS
  Administered 2012-04-26: 3 [IU] via SUBCUTANEOUS
  Administered 2012-04-27: 5 [IU] via SUBCUTANEOUS
  Administered 2012-04-28: 2 [IU] via SUBCUTANEOUS
  Administered 2012-04-28: 3 [IU] via SUBCUTANEOUS
  Administered 2012-04-29 – 2012-04-30 (×2): 2 [IU] via SUBCUTANEOUS

## 2012-04-23 MED ORDER — ASPIRIN EC 81 MG PO TBEC
81.0000 mg | DELAYED_RELEASE_TABLET | Freq: Every day | ORAL | Status: DC
  Administered 2012-04-24 – 2012-04-30 (×6): 81 mg via ORAL
  Filled 2012-04-23 (×7): qty 1

## 2012-04-23 MED ORDER — SODIUM CHLORIDE 0.9 % IJ SOLN
3.0000 mL | Freq: Two times a day (BID) | INTRAMUSCULAR | Status: DC
  Administered 2012-04-23: 3 mL via INTRAVENOUS
  Administered 2012-04-25: 11:00:00 via INTRAVENOUS
  Administered 2012-04-25: 3 mL via INTRAVENOUS
  Administered 2012-04-26: 10:00:00 via INTRAVENOUS
  Administered 2012-04-26 – 2012-04-29 (×5): 3 mL via INTRAVENOUS

## 2012-04-23 MED ORDER — HEPARIN SODIUM (PORCINE) 5000 UNIT/ML IJ SOLN
5000.0000 [IU] | Freq: Three times a day (TID) | INTRAMUSCULAR | Status: DC
Start: 2012-04-23 — End: 2012-04-29
  Administered 2012-04-23 – 2012-04-28 (×17): 5000 [IU] via SUBCUTANEOUS
  Filled 2012-04-23 (×21): qty 1

## 2012-04-23 MED ORDER — ASPIRIN 81 MG PO TABS: 81.0000 mg | ORAL_TABLET | Freq: Every day | ORAL | Status: DC

## 2012-04-23 MED ORDER — SODIUM CHLORIDE 0.9 % IJ SOLN: 3.0000 mL | INTRAMUSCULAR | Status: DC | PRN

## 2012-04-23 MED ORDER — PANTOPRAZOLE SODIUM 40 MG PO TBEC
40.0000 mg | DELAYED_RELEASE_TABLET | Freq: Two times a day (BID) | ORAL | Status: DC
  Administered 2012-04-23 – 2012-04-30 (×14): 40 mg via ORAL
  Filled 2012-04-23 (×14): qty 1

## 2012-04-23 MED ORDER — FUROSEMIDE 80 MG PO TABS
80.0000 mg | ORAL_TABLET | Freq: Two times a day (BID) | ORAL | Status: DC
  Administered 2012-04-23 – 2012-04-30 (×13): 80 mg via ORAL
  Filled 2012-04-23 (×16): qty 1

## 2012-04-23 MED ORDER — GABAPENTIN (ONCE-DAILY) 300 MG PO TABS: 1.0000 | ORAL_TABLET | Freq: Three times a day (TID) | ORAL | Status: DC

## 2012-04-23 MED ORDER — SODIUM CHLORIDE 0.9 % IV SOLN
250.0000 mL | INTRAVENOUS | Status: DC | PRN
  Administered 2012-04-24 – 2012-04-25 (×2): 250 mL via INTRAVENOUS

## 2012-04-23 MED ORDER — ATORVASTATIN CALCIUM 40 MG PO TABS
40.0000 mg | ORAL_TABLET | Freq: Every day | ORAL | Status: DC
  Administered 2012-04-24 – 2012-04-30 (×7): 40 mg via ORAL
  Filled 2012-04-23 (×7): qty 1

## 2012-04-23 MED ORDER — NITROGLYCERIN IN D5W 200-5 MCG/ML-% IV SOLN
2.0000 ug/min | INTRAVENOUS | Status: DC
  Administered 2012-04-23: 5 ug/min via INTRAVENOUS
  Administered 2012-04-24 (×2): 200 ug/min via INTRAVENOUS
  Administered 2012-04-25: 60 ug/min via INTRAVENOUS
  Administered 2012-04-25: 10 ug/min via INTRAVENOUS
  Administered 2012-04-25: 5 ug/min via INTRAVENOUS
  Administered 2012-04-25: 12 ug/min via INTRAVENOUS
  Administered 2012-04-25: 80 ug/min via INTRAVENOUS
  Filled 2012-04-23 (×6): qty 250

## 2012-04-23 MED ORDER — ONDANSETRON HCL 4 MG/2ML IJ SOLN
4.0000 mg | Freq: Four times a day (QID) | INTRAMUSCULAR | Status: DC | PRN
  Administered 2012-04-25: 4 mg via INTRAVENOUS
  Filled 2012-04-23: qty 2

## 2012-04-23 MED ORDER — POTASSIUM CHLORIDE CRYS ER 20 MEQ PO TBCR
20.0000 meq | EXTENDED_RELEASE_TABLET | Freq: Two times a day (BID) | ORAL | Status: DC
  Administered 2012-04-23 – 2012-04-30 (×14): 20 meq via ORAL
  Filled 2012-04-23 (×18): qty 1

## 2012-04-23 MED ORDER — GABAPENTIN 600 MG PO TABS
300.0000 mg | ORAL_TABLET | Freq: Three times a day (TID) | ORAL | Status: DC
  Filled 2012-04-23 (×2): qty 0.5

## 2012-04-23 MED ORDER — GABAPENTIN 300 MG PO CAPS
300.0000 mg | ORAL_CAPSULE | Freq: Three times a day (TID) | ORAL | Status: DC
  Administered 2012-04-23 – 2012-04-30 (×21): 300 mg via ORAL
  Filled 2012-04-23 (×23): qty 1

## 2012-04-23 MED ORDER — METHOCARBAMOL 500 MG PO TABS
500.0000 mg | ORAL_TABLET | Freq: Two times a day (BID) | ORAL | Status: DC | PRN
  Administered 2012-04-23: 500 mg via ORAL
  Filled 2012-04-23 (×2): qty 1

## 2012-04-23 MED ORDER — TRAZODONE HCL 150 MG PO TABS
150.0000 mg | ORAL_TABLET | Freq: Every day | ORAL | Status: DC
  Administered 2012-04-23 – 2012-04-29 (×7): 150 mg via ORAL
  Filled 2012-04-23 (×8): qty 1

## 2012-04-23 MED ORDER — FERROUS FUMARATE 325 (106 FE) MG PO TABS
1.0000 | ORAL_TABLET | Freq: Two times a day (BID) | ORAL | Status: DC
  Administered 2012-04-23 – 2012-04-30 (×14): 106 mg via ORAL
  Filled 2012-04-23 (×16): qty 1

## 2012-04-23 MED ORDER — TIOTROPIUM BROMIDE MONOHYDRATE 18 MCG IN CAPS
18.0000 ug | ORAL_CAPSULE | Freq: Every day | RESPIRATORY_TRACT | Status: DC
  Administered 2012-04-24 – 2012-04-29 (×5): 18 ug via RESPIRATORY_TRACT
  Filled 2012-04-23 (×3): qty 5

## 2012-04-23 NOTE — ED Notes (Signed)
Patient advised she had went to see [facility] cardiologist as a follow up visit.  Patient states she was supposed to have an echo today then see MD for results.   Patient advised started having chest pain in central chest with no radiation.   Patient advised that the pain is now resolved.

## 2012-04-23 NOTE — Assessment & Plan Note (Signed)
Is constant presents with an episode of syncope. She presented to see [name] yesterday. Today she is found to have 2:1 heart block. I suspect that she has been having some significant bradycardia which explains her  or syncope. We'll need to hold her carvedilol.  She may ultimately need a pacer.

## 2012-04-23 NOTE — ED Provider Notes (Signed)
History     CSN: [id number]  Arrival date & time 04/23/12  0906   First MD Initiated Contact with Patient 04/23/12 [phone]      Chief Complaint  Patient presents with  . Chest Pain    resolved on arrival    (Consider location/radiation/quality/duration/timing/severity/associated sxs/prior treatment) HPI Comments: Patient is a 67 year old female with a PMH of coronary artery disease with 4 stent placements and last cath on 05/02/2011, diabetes, hypertension, and CHF who presents with central, substernal, stabbing chest pain with onset at approximately 8 AM this morning. Patient states she was in waiting room to have heart echo completed when chest pain began; chest pain was nonradiating and lasted approximately 15 minutes before spontaneously resolving. Patient versus a second episode of chest pain shortly after which lasted ~5 minutes and spontaneously resolved. Patient admits to improvement of her symptoms with 4 L oxygen via nasal cannula. Patient admits to associated dizziness, lightheadedness, nausea, and mild SOB. She denies fever, syncope, jaw pain, vomiting, abdominal pain, upper extremity weakness, or numbness or tingling in her extremities. Patient endorses using a CPAP at night and 2L O2 via nasal cannula while at home. Patient's cardiologist is [name] with [facility]. PCP - [name]. Patient asymptomatic at this time.  Patient is a 67 y.o. female presenting with chest pain. The history is provided by the patient. No language interpreter was used.  Chest Pain Associated symptoms: dizziness, nausea and shortness of breath   Associated symptoms: no abdominal pain, no fever, no numbness, no palpitations, not vomiting and no weakness     Past Medical History  Diagnosis Date  . DM   . DYSLIPIDEMIA   . OBESITY NOS   . DEPRESSION, RECURRENT, IN PARTIAL REMISSION   . HYPERTENSION   . CORONARY ARTERY DISEASE     S/p Q wave MI 2002 with RCA stent and AngioJet   Cath 4/30 99% distal  LAD stenosis unchanged from previous, 20% left main stenosis, patent stents  . CONGESTIVE HEART FAILURE     EF 40-45% echo 05/2011  . GERD   . DIVERTICULOSIS, COLON, WITH HEMORRHAGE   . BACK PAIN, LUMBAR, WITH RADICULOPATHY   . OBSTRUCTIVE SLEEP APNEA   . Asthma   . Anemia, iron deficiency   . Hyponatremia 12/2011  . Tobacco abuse   . Moderate COPD (chronic obstructive pulmonary disease) 11/07/2005    Spirometry 03/09/11>>FEV1 2.02 (88%), FEV1% 76      Past Surgical History  Procedure Laterality Date  . Uvuloplasty    . Total abdominal hysterectomy w/ bilateral salpingoophorectomy    . Abdominal hysterectomy    . Cholecystectomy    . Coronary angioplasty with stent placement  2002    Family History  Problem Relation Age of Onset  . Stroke Mother   . Breast cancer Sister   . Emphysema Sister   . Cancer Sister     breast  . Alcohol abuse Brother   . Colon cancer      uncles x 2  . Prostate cancer Brother     History  Substance Use Topics  . Smoking status: Former Smoker -- 1.00 packs/day for 20 years    Types: Cigarettes  . Smokeless tobacco: Never Used     Comment: Still smokes a little.  . Alcohol Use: No    OB History   Grav Para Term Preterm Abortions TAB SAB Ect Mult Living  Review of Systems  Constitutional: Negative for fever.  Eyes: Negative for visual disturbance.  Respiratory: Positive for shortness of breath.   Cardiovascular: Positive for chest pain. Negative for palpitations.  Gastrointestinal: Positive for nausea. Negative for vomiting and abdominal pain.  Neurological: Positive for dizziness and light-headedness. Negative for syncope, weakness and numbness.  All other systems reviewed and are negative.    Allergies  Amlodipine; Ramipril; Adhesive; Penicillins; and Codeine  Home Medications   Current Outpatient Rx  Name  Route  Sig  Dispense  Refill  . acetaminophen (TYLENOL) 500 MG tablet   Oral   Take 500 mg by mouth  every 6 (six) hours as needed for pain.         [name] albuterol (PROVENTIL) (2.5 MG/3ML) 0.083% nebulizer solution   Nebulization   Take 3 mLs (2.5 mg total) by nebulization every 6 (six) hours as needed for wheezing.   75 mL   12   . aspirin 81 MG tablet   Oral   Take 81 mg by mouth daily.           [name] atorvastatin (LIPITOR) 40 MG tablet   Oral   Take 1 tablet (40 mg total) by mouth daily.   90 tablet   3   . carvedilol (COREG) 25 MG tablet   Oral   Take 1.5 tablets (37.5 mg total) by mouth 2 (two) times daily with a meal. Take one and one half tablet twice daily. PT WANTS BRAND ONLY   90 tablet   11   . ferrous fumarate (FERRETTS) 325 (106 FE) MG TABS   Oral   Take 1 tablet (106 mg of iron total) by mouth 2 (two) times daily.   60 tablet   11   . furosemide (LASIX) 80 MG tablet   Oral   Take 1 tablet (80 mg total) by mouth 2 (two) times daily.   60 tablet   6   . Gabapentin, PHN, 300 MG TABS   Oral   Take 1 tablet by mouth 3 (three) times daily.   90 tablet   2   . hydrALAZINE (APRESOLINE) 25 MG tablet   Oral   Take 1 tablet (25 mg total) by mouth every 8 (eight) hours.   90 tablet   6   . isosorbide mononitrate (IMDUR) 30 MG 24 hr tablet   Oral   Take 30 mg by mouth daily.         . methocarbamol (ROBAXIN) 500 MG tablet   Oral   Take 1 tablet (500 mg total) by mouth 2 (two) times daily as needed.   60 tablet   1   . olmesartan (BENICAR) 40 MG tablet   Oral   Take 1 tablet (40 mg total) by mouth daily.   30 tablet   1   . pantoprazole (PROTONIX) 40 MG tablet   Oral   Take 40 mg by mouth 2 (two) times daily.         . Polyethylene Glycol 3350 (MIRALAX PO)   Oral   Take 17 g by mouth daily. As directed          . potassium chloride (MICRO-K) 10 MEQ CR capsule   Oral   Take 2 capsules (20 mEq total) by mouth 2 (two) times daily.   120 capsule   6   . sitaGLIPtan-metformin (JANUMET) 50-500 MG per tablet   Oral   Take 1 tablet by  mouth 2 (two) times daily with  a meal.   60 tablet   0   . tiotropium (SPIRIVA) 18 MCG inhalation capsule   Inhalation   Place 1 capsule (18 mcg total) into inhaler and inhale daily.   30 capsule   5   . traZODone (DESYREL) 100 MG tablet   Oral   Take 150 mg by mouth at bedtime. 1 and 1/2 pills at hs         . venlafaxine (EFFEXOR XR) 150 MG 24 hr capsule   Oral   Take 150 mg by mouth 2 (two) times daily.             BP 169/85  Pulse 66  Temp(Src) 98.4 F (36.9 C) (Oral)  Resp 15  Ht 5\' 9"  (1.753 m)  Wt 155 lb (70.308 kg)  BMI 22.88 kg/m2  SpO2 99%  Physical Exam  Nursing note and vitals reviewed. Constitutional: She is oriented to person, place, and time. She appears well-developed. No distress.  HENT:  Head: Normocephalic and atraumatic.  Mouth/Throat: Oropharynx is clear and moist. No oropharyngeal exudate.  Eyes: Conjunctivae are normal. Pupils are equal, round, and reactive to light. No scleral icterus.  Neck: Normal range of motion. Neck supple.  Cardiovascular: Normal rate, regular rhythm and intact distal pulses.   1/6 SEM; no pitting edema appreciated in b/l lower extremities.   Pulmonary/Chest: Effort normal and breath sounds normal. No respiratory distress. She has no wheezes. She has no rales.  Abdominal: Soft. She exhibits no distension. There is no tenderness. There is no rebound.  Musculoskeletal: Normal range of motion.  Lymphadenopathy:    She has no cervical adenopathy.  Neurological: She is alert and oriented to person, place, and time.  Skin: Skin is warm and dry. No rash noted. She is not diaphoretic. No erythema.  Psychiatric: She has a normal mood and affect. Her behavior is normal.    ED Course  Procedures (including critical care time)  Labs Reviewed  CBC - Abnormal; Notable for the following:    RBC 3.30 (*)    Hemoglobin 9.9 (*)    HCT 29.0 (*)    RDW 16.8 (*)    All other components within normal limits  BASIC METABOLIC PANEL  - Abnormal; Notable for the following:    Potassium 3.4 (*)    GFR calc non Af Amer 59 (*)    GFR calc Af Amer 69 (*)    All other components within normal limits  PRO B NATRIURETIC PEPTIDE - Abnormal; Notable for the following:    Pro B Natriuretic peptide (BNP) 11818.0 (*)    All other components within normal limits  TROPONIN I  POCT I-STAT TROPONIN I   Dg Chest 2 View  04/23/2012  *RADIOLOGY REPORT*  Clinical Data: Chest pain and hypertension  CHEST - 2 VIEW  Comparison: 03/25/2012  Findings: The heart is again mildly enlarged in size.  Mild interstitial changes are again seen and given some variation and the technical aspects of the film these are stable in appearance. Slight increased density is noted in the left retrocardiac region likely representing an early infiltrate.  No other focal abnormality is seen.  IMPRESSION: Early left basilar infiltrate.   Original Report Authenticated By: Alcide Clever, M.D.      Date: 04/23/2012  Rate: 68  Rhythm: sinus arrhythmia  QRS Axis: normal  Intervals: PR prolonged  ST/T Wave abnormalities: normal  Conduction Disutrbances:first-degree A-V block   Narrative Interpretation: Sinus arrythmia with 1st degree AV block; no  STEMI  Old EKG Reviewed: sinus arrythmia new compared to EKG on 04/22/2012 and 01/2012 I have personally reviewed and interpreted this EKG.   1. Chest pain   2. Elevated brain natriuretic peptide (BNP) level     MDM  67 year old female with a PMH of coronary artery disease with 4 stent placements and last cath on 05/02/2011, diabetes, hypertension, and CHF who presents with central, substernal, stabbing chest pain with onset at approximately 8 AM this morning. Physical exam without significant findings; she is well and nontoxic appearing in NAD. Patient with known cardiac history with Q wave MI in 2002 and 4 stent placements since 2002; TIMI score 5 correlating with 26% risk of cardiac event. W/u to include CBC, BMP, BNP, troponin  x2, EKG, and CXR. Patient asymptomatic at this time.  CXR without PTX, pleural effusion; mildly increased density in L retrocardiac region c/w early infiltrate. I have reviewed these Xrays and agree with these findings. BNP also [place] [address]. Dr. Adriana Simas spoke with Dr. Truddie Crumble who states patient had pulse in 30's while in office with 2-1 block. Will call for cards admit for further work up and evaluation of the patient's symptoms. Patient hemodynamically stable and still asymptomatic, pulse in 60's and satting 99% on 2L [place].      Antony Madura, PA-C 04/24/12 [phone]

## 2012-04-23 NOTE — H&P (Signed)
[address], [address] [place], [place]  [address] Phone: [phone], Fax:  [phone]   Date:  04/23/2012     Name:  [name]                 DOB:  04/23/1945                       MRN:  [id number]   PCP:  [name], MD   Primary Cardiologist:  Dr. [name]   Primary Electrophysiologist:  None      History of Present Illness: [name] is a 67 y.o. female who returns for follow up.  She was seen yesterday with complaints of syncope.   She has a hx of CAD, s/p MI in 2002 treated with RCA stent, DM2, HTN, HL, CHF, sleep apnea, GERD, asthma, tobacco abuse, iron deficiency anemia. LHC 4/13:  dLAD 99% unchanged from 2010 (too small for PCI), mid and distal RCA stents patent, pRCA 30% before stent, EF 35%. Echo 5/13: Mild LVH, indeterminate diastolic function, EF 40-45%, moderate to severe inferior and posterior HK, mild LAE, mild RAE. Medical therapy continued.  Admitted 12/2011 with a/c combined systolic and diastolic CHF.  I saw her in 01/2012. She was stable at that time.     She notes chronic dyspnea with exertion. She is NYHA class III. She notes occasional chest pain. Overall, this is stable without significant change. She notes that it is worse if she forgets her nitrates. She sleeps at an incline chronically. She denies PND. She has mild pedal edema without significant change. She has noted 2 episodes of syncope recently. Details are not clear. She was in the shower once and was awoken by her husband sometime later. There is another time when she was sitting on the commode and proceeded to pass out. She denies a prodrome. She denies associated chest pain or shortness of breath.  She does note some confusion upon awakening.   April 23, 2012:  [name] presented today for her event monitor. She was in the lobby and started having episodes of chest pain and generalized  weakness. CODE BLUE was called. She was taken back to the exam room and evaluated. She was found to have significant bradycardia. She was given oxygen. Her chest pain resolved fairly quickly. She did not have any episodes of syncope today. She is breathing better at this point.    Her only complaint is of the recent syncope. She's not had any other episodes of angina except at this point. To be taken up to the hospital. We'll need to have her carvedilol held. We'll need to rule out myocardial infarction.       Past Medical History   Diagnosis  Date   .  DM     .  DYSLIPIDEMIA     .  OBESITY NOS     .  DEPRESSION, RECURRENT, IN PARTIAL REMISSION     .  HYPERTENSION     .  CORONARY ARTERY DISEASE         S/p Q wave MI 2002 with RCA stent and AngioJet   Cath 4/30 99% distal LAD stenosis unchanged from previous, 20% left main stenosis, patent stents   .  CONGESTIVE HEART FAILURE         EF 40-45% echo 05/2011   .  GERD     .  DIVERTICULOSIS, COLON, WITH HEMORRHAGE     .  BACK PAIN, LUMBAR, WITH RADICULOPATHY     .  OBSTRUCTIVE SLEEP APNEA     .  Asthma     .  Anemia, iron deficiency     .  Hyponatremia  12/2011   .  Tobacco abuse     .  Moderate COPD (chronic obstructive pulmonary disease)  11/07/2005       Spirometry 03/09/11>>FEV1 2.02 (88%), FEV1% 76          Current Outpatient Prescriptions   Medication  Sig  Dispense  Refill   .  acetaminophen (TYLENOL) 500 MG tablet  Take 500 mg by mouth every 6 (six) hours as needed for pain.         [name]  albuterol (PROVENTIL) (2.5 MG/3ML) 0.083% nebulizer solution  Take 3 mLs (2.5 mg total) by nebulization every 6 (six) hours as needed for wheezing.   75 mL   12   .  aspirin 81 MG tablet  Take 81 mg by mouth daily.           [name]  atorvastatin (LIPITOR) 40 MG tablet  Take 1 tablet (40 mg total) by mouth daily.   90 tablet   3   .  carvedilol (COREG) 25 MG tablet  Take 1.5 tablets (37.5 mg total) by mouth 2 (two) times daily with a meal. Take one and  one half tablet twice daily. PT WANTS BRAND ONLY   90 tablet   11   .  ferrous fumarate (FERRETTS) 325 (106 FE) MG TABS  Take 1 tablet (106 mg of iron total) by mouth 2 (two) times daily.   60 tablet   11   .  furosemide (LASIX) 80 MG tablet  Take 1 tablet (80 mg total) by mouth 2 (two) times daily.   60 tablet   6   .  Gabapentin, PHN, 300 MG TABS  Take 1 tablet by mouth 3 (three) times daily.   90 tablet   2   .  hydrALAZINE (APRESOLINE) 25 MG tablet  Take 1 tablet (25 mg total) by mouth every 8 (eight) hours.   90 tablet   6   .  isosorbide mononitrate (IMDUR) 30 MG 24 hr tablet  Take 30 mg by mouth daily.         .  methocarbamol (ROBAXIN) 500 MG tablet  Take 1 tablet (500 mg total) by mouth 2 (two) times daily as needed.   60 tablet   1   .  olmesartan (BENICAR) 40 MG tablet  Take 1 tablet (40 mg total) by mouth daily.   30 tablet   1   .  pantoprazole (PROTONIX) 40 MG tablet  Take 40 mg by mouth 2 (two) times daily.         .  Polyethylene Glycol 3350 (MIRALAX PO)  Take 17 g by mouth daily. As directed          .  potassium chloride (MICRO-K) 10 MEQ CR capsule  Take 2 capsules (20 mEq total) by mouth 2 (two) times daily.   120 capsule   6   .  sitaGLIPtan-metformin (JANUMET) 50-500 MG per tablet  Take 1 tablet by mouth 2 (two) times daily with a meal.   60 tablet   0   .  tiotropium (SPIRIVA) 18 MCG inhalation capsule  Place 1 capsule (18 mcg total) into inhaler and inhale daily.   30 capsule   5   .  traZODone (DESYREL) 100 MG tablet  Take 150 mg by mouth at bedtime. 1 and 1/2 pills at hs         .  venlafaxine (EFFEXOR XR) 150 MG 24 hr capsule  Take 150 mg by mouth 2 (two) times daily.           .  [DISCONTINUED] budesonide-formoterol (SYMBICORT) 80-4.5 MCG/ACT inhaler  Inhale 2 puffs into the lungs 2 (two) times daily.   1 Inhaler   12       No current facility-administered medications for this visit.      Allergies: Allergies   Allergen  Reactions   .  Amlodipine  Other (See  Comments)       constipation   .  Ramipril  Cough   .  Adhesive (Tape)  Hives       "peels skin off"   .  Penicillins  Itching   .  Codeine  Rash    Family Hx   Social History:  The patient  reports that she has quit smoking. Her smoking use included Cigarettes. She has a 20 pack-year smoking history. She has never used smokeless tobacco. She reports that she does not drink alcohol or use illicit drugs.   ROS:  Please see the history of present illness.      All other systems reviewed and negative.     PHYSICAL EXAM: VS:  There were no vitals taken for this visit.   There were no vitals filed for this visit.   Well nourished, well developed, in no acute distress  HEENT: normal  Neck: no JVD  Cardiac:  normal S1, S2; RRR; no murmur  Lungs:  Decreased breath sounds bilaterally, no wheezing, rhonchi or rales  Abd: soft, nontender, no hepatomegaly  Ext: trace bilateral LE edema  Skin: warm and dry  Neuro:  CNs 2-12 intact, no focal abnormalities noted   EKG:  NSR, HR 66, lateral T-wave inversions, no significant change compared to prior tracing      Combined Systolic and Diastolic CHF:  Volume appears stable.  Recent BMET ok.  Continue current Rx.   CAD:  CP is stable.  I do not think a stress test is warranted at this time.  Continue ASA and statin. Hypertension:  BPs have been controlled and BP was optimal at PCPs office today.  Continue current Rx and monitor.    Hyperlipidemia:  Continue statin.   Anemia:  Followed by PCP. Tobacco Abuse:  She has started back smoking and knows she needs to quit.    CORONARY ARTERY DISEASE           The second and her is seen today in the office for acute chest pain. This was in the setting of marked bradycardia. I suspect that this may be due to the high dose carvedilol. We'll need to hold the carvedilol for now. She may also need a pacemaker. When he take the cardiac enzymes rule out myocardial infarction.   she may need a cardiac  catheterization.        Syncope       [name] presents with an episode of syncope. She presented to see Tereso Newcomer yesterday. Today she is found to have 2:1 heart block. I suspect that she has been having some significant bradycardia which explains her  or syncope. We'll need to hold her carvedilol.  She may ultimately need a pacer.       Vesta Mixer, Montez Hageman., MD, [facility] 04/24/2012, 7:43 AM Office - [phone] Pager  [phone]  

## 2012-04-23 NOTE — Progress Notes (Signed)
[address], [address] [place], [place]  [address] Phone: [phone], Fax:  [phone]  Date:  04/23/2012   Name:  [name]   DOB:  [birth year]   MRN:  [id number]  PCP:  [name], MD  Primary Cardiologist:  Dr. [name]  Primary Electrophysiologist:  None    History of Present Illness: [name] is a 67 y.o. female who returns for follow up.  She has a hx of CAD, s/p MI in 2002 treated with RCA stent, DM2, HTN, HL, CHF, sleep apnea, GERD, asthma, tobacco abuse, iron deficiency anemia. LHC 4/13:  dLAD 99% unchanged from 2010 (too small for PCI), mid and distal RCA stents patent, pRCA 30% before stent, EF 35%. Echo 5/13: Mild LVH, indeterminate diastolic function, EF 40-45%, moderate to severe inferior and posterior HK, mild LAE, mild RAE. Medical therapy continued.  Admitted 12/2011 with a/c combined systolic and diastolic CHF.  I saw her in 01/2012. She was stable at that time.    She notes chronic dyspnea with exertion. She is NYHA class III. She notes occasional chest pain. Overall, this is stable without significant change. She notes that it is worse if she forgets her nitrates. She sleeps at an incline chronically. She denies PND. She has mild pedal edema without significant change. She has noted 2 episodes of syncope recently. Details are not clear. She was in the shower once and was awoken by her husband sometime later. There is another time when she was sitting on the commode and proceeded to pass out. She denies a prodrome. She denies associated chest pain or shortness of breath.  She does note some confusion upon awakening.  Labs (12/13):   K 4.2, Cr 1.3, ALT 11, proBNP 28602, LDL 76, Hgb 9.8, TSH 3.650 Labs (4/14):     K 4, Cr 1.1, ALT 7, Hgb 9.6   April 23, 2012:  [name] presented today for her event monitor. She was in the lobby and started having episodes of chest pain and generalized weakness. CODE BLUE was called. She was  taken back to the exam room and evaluated. She was found to have significant bradycardia. She was given oxygen. Her chest pain resolved fairly quickly. She did not have any episodes of syncope today. She is breathing better at this point.   Her only complaint is of the recent syncope. She's not had any other episodes of angina except at this point. To be taken up to the hospital. We'll need to have her carvedilol held. We'll need to rule out myocardial infarction.  Wt Readings from Last 3 Encounters:  04/22/12 155 lb (70.308 kg)  04/22/12 156 lb (70.761 kg)  04/16/12 158 lb 8 oz (71.895 kg)     Past Medical History  Diagnosis Date  . DM   . DYSLIPIDEMIA   . OBESITY NOS   . DEPRESSION, RECURRENT, IN PARTIAL REMISSION   . HYPERTENSION   . 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 CONGESTIVE HEART FAILURE     EF 40-45% echo 05/2011  . GERD   . DIVERTICULOSIS, COLON, WITH HEMORRHAGE   . BACK PAIN, LUMBAR, WITH RADICULOPATHY   . OBSTRUCTIVE SLEEP APNEA   . Asthma   . Anemia, iron deficiency   . Hyponatremia 12/2011  . Tobacco abuse   .  Moderate COPD (chronic obstructive pulmonary disease) 11/07/2005    Spirometry 03/09/11>>FEV1 2.02 (88%), FEV1% 76      Current Outpatient Prescriptions  Medication Sig Dispense Refill  . acetaminophen (TYLENOL) 500 MG tablet Take 500 mg by mouth every 6 (six) hours as needed for pain.      [name] albuterol (PROVENTIL) (2.5 MG/3ML) 0.083% nebulizer solution Take 3 mLs (2.5 mg total) by nebulization every 6 (six) hours as needed for wheezing.  75 mL  12  . aspirin 81 MG tablet Take 81 mg by mouth daily.        [name] atorvastatin (LIPITOR) 40 MG tablet Take 1 tablet (40 mg total) by mouth daily.  90 tablet  3  . carvedilol (COREG) 25 MG tablet Take 1.5 tablets (37.5 mg total) by mouth 2 (two) times daily with a meal. Take one and  one half tablet twice daily. PT WANTS BRAND ONLY  90 tablet  11  . ferrous fumarate (FERRETTS) 325 (106 FE) MG TABS Take 1 tablet (106 mg of iron total) by mouth 2 (two) times daily.  60 tablet  11  . furosemide (LASIX) 80 MG tablet Take 1 tablet (80 mg total) by mouth 2 (two) times daily.  60 tablet  6  . Gabapentin, PHN, 300 MG TABS Take 1 tablet by mouth 3 (three) times daily.  90 tablet  2  . hydrALAZINE (APRESOLINE) 25 MG tablet Take 1 tablet (25 mg total) by mouth every 8 (eight) hours.  90 tablet  6  . isosorbide mononitrate (IMDUR) 30 MG 24 hr tablet Take 30 mg by mouth daily.      . methocarbamol (ROBAXIN) 500 MG tablet Take 1 tablet (500 mg total) by mouth 2 (two) times daily as needed.  60 tablet  1  . olmesartan (BENICAR) 40 MG tablet Take 1 tablet (40 mg total) by mouth daily.  30 tablet  1  . pantoprazole (PROTONIX) 40 MG tablet Take 40 mg by mouth 2 (two) times daily.      . Polyethylene Glycol 3350 (MIRALAX PO) Take 17 g by mouth daily. As directed       . potassium chloride (MICRO-K) 10 MEQ CR capsule Take 2 capsules (20 mEq total) by mouth 2 (two) times daily.  120 capsule  6  . sitaGLIPtan-metformin (JANUMET) 50-500 MG per tablet Take 1 tablet by mouth 2 (two) times daily with a meal.  60 tablet  0  . tiotropium (SPIRIVA) 18 MCG inhalation capsule Place 1 capsule (18 mcg total) into inhaler and inhale daily.  30 capsule  5  . traZODone (DESYREL) 100 MG tablet Take 150 mg by mouth at bedtime. 1 and 1/2 pills at hs      . venlafaxine (EFFEXOR XR) 150 MG 24 hr capsule Take 150 mg by mouth 2 (two) times daily.        . [DISCONTINUED] budesonide-formoterol (SYMBICORT) 80-4.5 MCG/ACT inhaler Inhale 2 puffs into the lungs 2 (two) times daily.  1 Inhaler  12   No current facility-administered medications for this visit.    Allergies: Allergies  Allergen Reactions  . Amlodipine Other (See Comments)    constipation  . Ramipril Cough  . Adhesive (Tape) Hives    "peels skin off"   . Penicillins Itching  . Codeine Rash    Social History:  The patient  reports that she has quit smoking. Her smoking use included Cigarettes. She has a 20 pack-year smoking history. She has never used smokeless tobacco. She reports  that she does not drink alcohol or use illicit drugs.   ROS:  Please see the history of present illness.      All other systems reviewed and negative.   PHYSICAL EXAM: VS:  There were no vitals taken for this visit.  There were no vitals filed for this visit.  Well nourished, well developed, in no acute distress HEENT: normal Neck: no JVD Cardiac:  normal S1, S2; RRR; no murmur Lungs:  Decreased breath sounds bilaterally, no wheezing, rhonchi or rales Abd: soft, nontender, no hepatomegaly Ext: trace bilateral LE edema Skin: warm and dry Neuro:  CNs 2-12 intact, no focal abnormalities noted  EKG:  NSR, HR 66, lateral T-wave inversions, no significant change compared to prior tracing    ASSESSMENT AND PLAN:  1. Syncope:  Etiology not clear.  She has fairly stable chest pain.  ECG is unchanged.  BP does drop some with standing but does recover.  She was asymptomatic with orthostatic VS today.  I have given her a Rx for compression stockings.  Recent BMET and CBC was stable.  I will obtain an echo and and event monitor. 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  If her EF is down, she may need repeat cardiac cath.  No driving until seen in f/u. 2. Combined Systolic and Diastolic CHF:  Volume appears stable.  Recent BMET ok.  Continue current Rx.  3. CAD:  CP is stable.  I do not think a stress test is warranted at this time.  Continue ASA and statin. 4. Hypertension:  BPs have been controlled and BP was optimal at PCPs office today.  Continue current Rx and monitor.   5. Hyperlipidemia:  Continue statin.  6. Anemia:  Followed by PCP. 7. Tobacco Abuse:  She has started back smoking and knows she needs to quit. 8. Disposition:  F/u with Dr. [name] in 6  weeks.   Luna Glasgow, PA-C  8:49 AM 04/23/2012

## 2012-04-23 NOTE — Assessment & Plan Note (Signed)
The second and her is seen today in the office for acute chest pain. This was in the setting of marked bradycardia. I suspect that this may be due to the high dose carvedilol. We'll need to hold the carvedilol for now. She may also need a pacemaker. When he take the cardiac enzymes rule out myocardial infarction.   she may need a cardiac catheterization.

## 2012-04-24 DIAGNOSIS — I509 Heart failure, unspecified: Secondary | ICD-10-CM

## 2012-04-24 DIAGNOSIS — I369 Nonrheumatic tricuspid valve disorder, unspecified: Secondary | ICD-10-CM

## 2012-04-24 DIAGNOSIS — I5023 Acute on chronic systolic (congestive) heart failure: Secondary | ICD-10-CM

## 2012-04-24 DIAGNOSIS — R55 Syncope and collapse: Secondary | ICD-10-CM

## 2012-04-24 DIAGNOSIS — R079 Chest pain, unspecified: Secondary | ICD-10-CM

## 2012-04-24 LAB — BASIC METABOLIC PANEL
Chloride: 101 mEq/L (ref 96–112)
GFR calc Af Amer: 75 mL/min — ABNORMAL LOW (ref >90)
GFR calc non Af Amer: 65 mL/min — ABNORMAL LOW (ref >90)
Potassium: 3.1 mEq/L — ABNORMAL LOW (ref 3.5–5.1)
Sodium: 143 mEq/L (ref 135–145)

## 2012-04-24 LAB — CBC
HCT: 29.5 % — ABNORMAL LOW (ref 36.0–46.0)
MCHC: 33.6 g/dL (ref 30.0–36.0)
Platelets: 248 10*3/uL (ref 150–400)
RDW: 16.8 % — ABNORMAL HIGH (ref 11.5–15.5)
WBC: 4.7 10*3/uL (ref 4.0–10.5)

## 2012-04-24 LAB — TROPONIN I: Troponin I: <0.3 ng/mL (ref <0.30)

## 2012-04-24 LAB — GLUCOSE, CAPILLARY
Glucose-Capillary: 134 mg/dL — ABNORMAL HIGH (ref 70–99)
Glucose-Capillary: 150 mg/dL — ABNORMAL HIGH (ref 70–99)

## 2012-04-24 MED ORDER — HYDRALAZINE HCL 20 MG/ML IJ SOLN
10.0000 mg | INTRAMUSCULAR | Status: DC | PRN
  Administered 2012-04-24 – 2012-04-27 (×3): 10 mg via INTRAVENOUS
  Filled 2012-04-24 (×4): qty 1

## 2012-04-24 MED ORDER — GLUCERNA SHAKE PO LIQD
237.0000 mL | Freq: Two times a day (BID) | ORAL | Status: DC
  Administered 2012-04-24 – 2012-04-30 (×8): 237 mL via ORAL
  Filled 2012-04-24 (×5): qty 237

## 2012-04-24 MED ORDER — IRBESARTAN 300 MG PO TABS
300.0000 mg | ORAL_TABLET | Freq: Every day | ORAL | Status: DC
  Administered 2012-04-24 – 2012-04-30 (×7): 300 mg via ORAL
  Filled 2012-04-24 (×7): qty 1

## 2012-04-24 MED ORDER — HYDROCODONE-ACETAMINOPHEN 5-325 MG PO TABS
1.0000 | ORAL_TABLET | ORAL | Status: DC | PRN
  Administered 2012-04-24: 1 via ORAL
  Administered 2012-04-24 (×2): 2 via ORAL
  Administered 2012-04-29: 16:00:00 1 via ORAL
  Filled 2012-04-24 (×2): qty 2
  Filled 2012-04-24 (×2): qty 1

## 2012-04-24 NOTE — Progress Notes (Signed)
PROGRESS NOTE  Subjective:   She has a hx of CAD, s/p MI in 2002 treated with RCA stent, DM2, HTN, HL, CHF, sleep apnea, GERD, asthma, tobacco abuse, iron deficiency anemia. LHC 4/13: dLAD 99% unchanged from 2010 (too small for PCI), mid and distal RCA stents patent, pRCA 30% before stent, EF 35%. Echo 5/13: Mild LVH, indeterminate diastolic function, EF 40-45%, moderate to severe inferior and posterior HK, mild LAE, mild RAE. Medical therapy continued. Admitted 12/2011 with a/c combined systolic and diastolic CHF.   She was admitted with hx of syncope and 2:1 heart block.  She was also complaining of chest pain at that time.  She reports a 100 lb weight loss over the past 6 months .    Objective:    Vital Signs:   Temp:  [97.8 F (36.6 C)-98.5 F (36.9 C)] 98.4 F (36.9 C) (04/23 0400) Pulse Rate:  [47-89] 74 (04/23 0700) Resp:  [10-30] 14 (04/23 0700) BP: (112-212)/(36-165) 193/102 mmHg (04/23 0700) SpO2:  [92 %-100 %] 97 % (04/23 0700) Weight:  [141 lb 8.6 oz (64.2 kg)-155 lb (70.308 kg)] 141 lb 8.6 oz (64.2 kg) (04/23 0500)  Last BM Date: 04/23/12   24-hour weight change: Weight change:   Weight trends: Filed Weights   04/23/12 0926 04/23/12 1226 04/24/12 0500  Weight: 155 lb (70.308 kg) 148 lb 5.9 oz (67.3 kg) 141 lb 8.6 oz (64.2 kg)    Intake/Output:  04/22 0701 - 04/23 0700 In: 1063 [P.O.:1060; I.V.:3] Out: 4405 [Urine:4405]     Physical Exam: BP 193/102  Pulse 74  Temp(Src) 98.4 F (36.9 C) (Oral)  Resp 14  Ht 5\' 9"  (1.753 m)  Wt 141 lb 8.6 oz (64.2 kg)  BMI 20.89 kg/m2  SpO2 97%  General: Vital signs reviewed and noted.   Head: Normocephalic, atraumatic.  Eyes: conjunctivae/corneas clear.  EOM's intact.   Throat: normal  Neck:  normal  Lungs:    clear  Heart:  RR, occasional pause  Abdomen:  Soft, non-tender, non-distended    Extremities: No edema   Neurologic: A&O X3, CN II - XII are grossly intact.   Psych: Normal      Labs: BMET:  Recent Labs  04/23/12 0945 04/24/12 0408  NA 140 143  K 3.4* 3.1*  CL 98 101  CO2 32 36*  GLUCOSE 81 85  BUN 12 10  CREATININE 0.96 0.89  CALCIUM 9.0 9.0    Liver function tests: No results found for this basename: AST, ALT, ALKPHOS, BILITOT, PROT, ALBUMIN,  in the last 72 hours No results found for this basename: LIPASE, AMYLASE,  in the last 72 hours  CBC:  Recent Labs  04/23/12 0945 04/24/12 0408  WBC 4.0 4.7  HGB 9.9* 9.9*  HCT 29.0* 29.5*  MCV 87.9 88.9  PLT 244 248    Cardiac Enzymes:  Recent Labs  04/23/12 0945 04/23/12 1251 04/23/12 1811 04/24/12 0004  TROPONINI <0.30 <0.30 <0.30 <0.30    Coagulation Studies: No results found for this basename: LABPROT, INR,  in the last 72 hours  Other: No components found with this basename: POCBNP,  No results found for this basename: DDIMER,  in the last 72 hours No results found for this basename: HGBA1C,  in the last 72 hours No results found for this basename: CHOL, HDL, LDLCALC, TRIG, CHOLHDL,  in the last 72 hours  Recent Labs  04/23/12 1251  TSH 2.357   No results found for this basename: VITAMINB12, FOLATE, FERRITIN,  TIBC, IRON, RETICCTPCT,  in the last 72 hours   Other results:  Tele:  NSR, occasional pause  Medications:    Infusions: . nitroGLYCERIN 200 mcg/min (04/24/12 0500)    Scheduled Medications: . aspirin EC  81 mg Oral Daily  . atorvastatin  40 mg Oral Daily  . ferrous fumarate  1 tablet Oral BID  . furosemide  80 mg Oral BID  . gabapentin  300 mg Oral TID  . heparin  5,000 Units Subcutaneous Q8H  . hydrALAZINE  25 mg Oral Q8H  . insulin aspart  0-15 Units Subcutaneous TID WC  . isosorbide mononitrate  30 mg Oral Daily  . pantoprazole  40 mg Oral BID  . potassium chloride  20 mEq Oral BID  . sodium chloride  3 mL Intravenous Q12H  . tiotropium  18 mcg Inhalation Daily  . traZODone  150 mg Oral QHS  . venlafaxine XR  150 mg Oral BID    Assessment/  Plan:    1. Syncope:  Due to heart block and high dose coreg.  She may ultimately need pacer.  Have asked EF to see her.  We will allow her coreg to get out of her system.   2. HTN:  BP is still elevated. Seems better with amlodipine and hydralazine.   3. Weight loss:  No explanation as of yet.  4. CHF:  Combined systolic and diastolic CHF.  Will get echo.      Disposition:  Length of Stay: 1  Vesta Mixer, Montez Hageman., MD, [facility] 04/24/2012, 7:48 AM Office [phone] Pager [phone]

## 2012-04-24 NOTE — Progress Notes (Signed)
INITIAL NUTRITION ASSESSMENT  DOCUMENTATION CODES Per approved criteria  -Non-severe (moderate) malnutrition in the context of chronic illness   INTERVENTION: 1. Glucerna Shake po TID, each supplement provides 220 kcal and 10 grams of protein.   NUTRITION DIAGNOSIS: Increased nutrient needs related to CHF/COPD as evidenced by estimated nutrition needs/hx of weight loss.   Goal: PO intake to meet >/=90% estimated nutrition needs  Monitor:  PO intake, weight trends, labs  Reason for Assessment: Malnutrition Screening Tool  67 y.o. female  Admitting Dx: Chest pain   ASSESSMENT: Pt presented to cards appointment for syncope. At appointment started to have chest pain and was admitted to hospital.  Pt states that she has lost over 100 lb in the past year. Some is associated with fluid losses after being dx with CHF. Continues to lose weight "every month."  Weight hx on file shows 38 lb (21% body weight) losses in the past 4-5 months. Severe weight loss. Pt is eating well. RN reports pt is requesting snacks often and completing meals.   Nutrition Focused Physical Exam: Subcutaneous Fat:  Orbital Region: WNL Upper Arm Region: WNL Thoracic and Lumbar Region: WNL Muscle:  Temple Region: WNL Clavicle Bone Region: mild wasting Clavicle and Acromion Bone Region: WNL Scapular Bone Region: mild wasting Dorsal Hand: n/a Patellar Region: mild wasting Anterior Thigh Region: moderate wasting Posterior Calf Region: moderate wasting  Edema: none    Pt meets criteria for moderate malnutrition in the context of chronic illness 2/2 weight loss of 21% body weight in 4 months, and mild-moderate muscle wasting evident on physical exam.   Height: Ht Readings from Last 1 Encounters:  04/23/12 5\' 9"  (1.753 m)    Weight: Wt Readings from Last 1 Encounters:  04/24/12 141 lb 8.6 oz (64.2 kg)    Ideal Body Weight: 145 lbs   % Ideal Body Weight: 97%  Wt Readings from Last 10 Encounters:   04/24/12 141 lb 8.6 oz (64.2 kg)  04/22/12 155 lb (70.308 kg)  04/22/12 156 lb (70.761 kg)  04/16/12 158 lb 8 oz (71.895 kg)  03/25/12 160 lb 2 oz (72.632 kg)  03/13/12 161 lb (73.029 kg)  03/01/12 163 lb (73.936 kg)  01/17/12 165 lb 1.9 oz (74.898 kg)  12/28/11 176 lb (79.833 kg)  12/22/11 179 lb 4 oz (81.307 kg)    Usual Body Weight: 179 lbs   % Usual Body Weight: 78%  BMI:  Body mass index is 20.89 kg/(m^2). WNL   Estimated Nutritional Needs: Kcal: 1700-1900 Protein: 80-90 gm  Fluid: < 2 L   Skin: intact   Diet Order: Carb Control  EDUCATION NEEDS: -No education needs identified at this time   Intake/Output Summary (Last 24 hours) at 04/24/12 1122 Last data filed at 04/24/12 1000  Gross per 24 hour  Intake 1693.3 ml  Output   5705 ml  Net -4011.7 ml    Last BM: PTA    Labs:   Recent Labs Lab 04/23/12 0945 04/24/12 0408  NA 140 143  K 3.4* 3.1*  CL 98 101  CO2 32 36*  BUN 12 10  CREATININE 0.96 0.89  CALCIUM 9.0 9.0  GLUCOSE 81 85    CBG (last 3)   Recent Labs  04/23/12 1646 04/23/12 2147 04/24/12 0752  GLUCAP 95 112* 137*   Lab Results  Component Value Date   HGBA1C 6.3 03/25/2012     Scheduled Meds: . aspirin EC  81 mg Oral Daily  . atorvastatin  40 mg  Oral Daily  . ferrous fumarate  1 tablet Oral BID  . furosemide  80 mg Oral BID  . gabapentin  300 mg Oral TID  . heparin  5,000 Units Subcutaneous Q8H  . hydrALAZINE  25 mg Oral Q8H  . insulin aspart  0-15 Units Subcutaneous TID WC  . irbesartan  300 mg Oral Daily  . isosorbide mononitrate  30 mg Oral Daily  . pantoprazole  40 mg Oral BID  . potassium chloride  20 mEq Oral BID  . sodium chloride  3 mL Intravenous Q12H  . tiotropium  18 mcg Inhalation Daily  . traZODone  150 mg Oral QHS  . venlafaxine XR  150 mg Oral BID    Continuous Infusions: . nitroGLYCERIN 200 mcg/min (04/24/12 0930)    Past Medical History  Diagnosis Date  . DM   . DYSLIPIDEMIA   . OBESITY NOS    . DEPRESSION, RECURRENT, IN PARTIAL REMISSION   . HYPERTENSION   . 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 CONGESTIVE HEART FAILURE     EF 40-45% echo 05/2011  . GERD   . DIVERTICULOSIS, COLON, WITH HEMORRHAGE   . BACK PAIN, LUMBAR, WITH RADICULOPATHY   . OBSTRUCTIVE SLEEP APNEA   . Asthma   . Anemia, iron deficiency   . Hyponatremia 12/2011  . Tobacco abuse   . Moderate COPD (chronic obstructive pulmonary disease) 11/07/2005    Spirometry 03/09/11>>FEV1 2.02 (88%), FEV1% 76      Past Surgical History  Procedure Laterality Date  . Uvuloplasty    . Total abdominal hysterectomy w/ bilateral salpingoophorectomy    . Abdominal hysterectomy    . Cholecystectomy    . Coronary angioplasty with stent placement  2002    Clarene Duke RD, LDN Pager [phone] After Hours pager [phone]

## 2012-04-24 NOTE — Progress Notes (Signed)
Pt is getting her maximum dose of the nitro drip but her BP is still in the 190s/100s. PA on call for [employer] cardiology was notified and said they will round on her this morning.----Marcelo Ickes, rn

## 2012-04-24 NOTE — Progress Notes (Signed)
  Echocardiogram 2D Echocardiogram has been performed.  Ellender Hose A 04/24/2012, 11:47 AM

## 2012-04-25 DIAGNOSIS — I441 Atrioventricular block, second degree: Secondary | ICD-10-CM

## 2012-04-25 DIAGNOSIS — R55 Syncope and collapse: Secondary | ICD-10-CM

## 2012-04-25 LAB — GLUCOSE, CAPILLARY
Glucose-Capillary: 140 mg/dL — ABNORMAL HIGH (ref 70–99)
Glucose-Capillary: 107 mg/dL — ABNORMAL HIGH (ref 70–99)
Glucose-Capillary: 151 mg/dL — ABNORMAL HIGH (ref 70–99)
Glucose-Capillary: 68 mg/dL — ABNORMAL LOW (ref 70–99)
Glucose-Capillary: 98 mg/dL (ref 70–99)

## 2012-04-25 LAB — BASIC METABOLIC PANEL
Chloride: 97 mEq/L (ref 96–112)
GFR calc Af Amer: 63 mL/min — ABNORMAL LOW (ref >90)
GFR calc non Af Amer: 55 mL/min — ABNORMAL LOW (ref >90)
Potassium: 3.5 mEq/L (ref 3.5–5.1)
Sodium: 140 mEq/L (ref 135–145)

## 2012-04-25 MED ORDER — MAGNESIUM HYDROXIDE 400 MG/5ML PO SUSP
30.0000 mL | Freq: Once | ORAL | Status: AC
  Administered 2012-04-25: 30 mL via ORAL
  Filled 2012-04-25: qty 30

## 2012-04-25 NOTE — ED Provider Notes (Signed)
Medical screening examination/treatment/procedure(s) were conducted as a shared visit with non-physician practitioner(s) and myself.  I personally evaluated the patient during the encounter.  Admit for bradycardic event approximately one hour prior to emergency department visit  Donnetta Hutching, MD 04/25/12 [phone]

## 2012-04-25 NOTE — Consult Note (Signed)
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 She was on carvedilol which has now been held since yesterday. She continues to have bradycardia in the 40s with both Wenckebach and 2:1 AV block.  [name] has chronic DOE with NYHA class III HF. She has occasional chest pain which is felt to be stable, medically managed with nitrates. She has chronic 3-pillow orthopnea but denies PND. She has chronic intermittent LE swelling without recent worsening. Her main concern has been recurrent syncope and she reports 2 episodes of syncope recently. She was evaluated by Tereso Newcomer, PA-C earlier this week who ordered an event monitor. The details of her episodes are not clear as she cannot recall specifics. She reports one episode occurred in the shower while she was seated on the shower chair and another while sitting on the commode. She thinks she "may have been a little woozy" but otherwise denies any warning or prodrome. She denies associated chest pain, shortness of breath, dizziness or palpitations. She denies nausea, vomiting or diaphoresis. Her husband is at bedside and assists with history questions. He reports some confusion upon regaining consciousness.   Yesterday, 04/23/2012, she presented to our office to pick up her event monitor. She was sitting in the lobby when she began having chest pain and weakness. Code Blue was called. She was taken back to the exam room and evaluated by Dr. Elease Hashimoto.  She was found to have significant bradycardia with heart rate of 35 bpm. A 12-lead ECG was done which revealed 2:1 AV block. She was given oxygen. Her chest pain resolved fairly quickly. She did not have syncope. She was then transported to [facility] for admission and further evaluation. Her BB has been held. She continues to have bradycardia with rates in the 40s with both Wenckebach and 2:1 AV block. CEs are negative. Echo as noted below.  Of note, she is undergoing work-up for unintentional/unexplained weight loss of 100 lbs in last 6 months.  Past Medical History Past Medical History  Diagnosis Date  . DM   . DYSLIPIDEMIA   . OBESITY NOS   . DEPRESSION, RECURRENT, IN PARTIAL REMISSION   . HYPERTENSION   . 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 CONGESTIVE HEART FAILURE     EF 40-45% echo 05/2011  . GERD   . DIVERTICULOSIS, COLON, WITH HEMORRHAGE   . BACK PAIN, LUMBAR, WITH RADICULOPATHY   . OBSTRUCTIVE SLEEP APNEA   . Asthma   . Anemia, iron deficiency   . Hyponatremia 12/2011  . Tobacco abuse   . Moderate COPD (chronic obstructive pulmonary disease) 11/07/2005    Spirometry 03/09/11>>FEV1 2.02 (88%), FEV1% 76      Past Surgical History Past Surgical History  Procedure Laterality Date  . Uvuloplasty    . Total abdominal hysterectomy w/ bilateral salpingoophorectomy    . Abdominal hysterectomy    .  Cholecystectomy    . Coronary angioplasty with stent placement  2002    Allergies/Intolerances Allergies  Allergen Reactions  . Amlodipine Other (See Comments)    constipation  . Ramipril Cough  . Adhesive (Tape) Hives    "peels skin off"  . Penicillins Itching  . Codeine Rash   Inpatient Medications . aspirin EC  81 mg Oral Daily  . atorvastatin  40 mg Oral Daily  . feeding supplement  237 mL Oral BID BM  . ferrous fumarate  1 tablet Oral  BID  . furosemide  80 mg Oral BID  . gabapentin  300 mg Oral TID  . heparin  5,000 Units Subcutaneous Q8H  . hydrALAZINE  25 mg Oral Q8H  . insulin aspart  0-15 Units Subcutaneous TID WC  . irbesartan  300 mg Oral Daily  . isosorbide mononitrate  30 mg Oral Daily  . pantoprazole  40 mg Oral BID  . potassium chloride  20 mEq Oral BID  . sodium chloride  3 mL Intravenous Q12H  . tiotropium  18 mcg Inhalation Daily  . traZODone  150 mg Oral QHS  . venlafaxine XR  150 mg Oral BID   . nitroGLYCERIN Stopped (04/25/12 0615)   Family History Family History  Problem Relation Age of Onset  . Stroke Mother   . Breast cancer Sister   . Emphysema Sister   . Cancer Sister     breast  . Alcohol abuse Brother   . Colon cancer      uncles x 2  . Prostate cancer Brother     Social History Social History  . Marital Status: Married   Occupational History  . Retired Insurance account manager for [employer]    Social History Main Topics  . Smoking status: Current Some Day Smoker -- 1.00 packs/day for 20 years    Types: Cigarettes  . Smokeless tobacco: Never Used  . Alcohol Use: No  . Drug Use: No   Review of Systems General: No chills, fever, night sweats or weight changes  Cardiovascular: As above in HPI. No palpitations, paroxysmal nocturnal dyspnea Dermatological: No rash, lesions or masses Respiratory: +cough Urologic: No hematuria, dysuria Abdominal: No nausea, vomiting, diarrhea, bright red blood per rectum, melena, or hematemesis Neurologic: As above in HPI. No visual changes, dysarthria or difficulty moving extremities All other systems reviewed and are otherwise negative except as noted above.  Physical Exam Blood pressure 153/54, pulse 66, temperature 98.1 F (36.7 C), temperature source Oral, resp. rate 17, height 5\' 9"  (1.753 m), weight 141 lb 1.5 oz (64 kg), SpO2 100.00%.  General: Well developed 67 year old female in no acute distress. HEENT: Normocephalic,  atraumatic. EOMs intact. Sclera nonicteric. Oropharynx clear.  Neck: Supple. No JVD. Lungs: Respirations regular and unlabored. Diminished breath sounds throughout but CTA bilaterally. No wheezes, rales or rhonchi. Heart: RRR. S1, S2 present. No murmurs, rub, S3 or S4. Abdomen: Soft, non-tender, non-distended. BS present x 4 quadrants. No hepatosplenomegaly.  Extremities: No clubbing or cyanosis. 1+ pedal edema bilaterally. DP/PT/Radials 2+ and equal bilaterally. Psych: Normal affect. Neuro: Alert and oriented X 3. Moves all extremities spontaneously. Musculoskeletal: No kyphosis. Skin: Intact. Warm and dry. No rashes or petechiae in exposed areas.   Labs  Recent Labs  04/23/12 0945 04/23/12 1251 04/23/12 1811 04/24/12 0004  TROPONINI <0.30 <0.30 <0.30 <0.30   Lab Results  Component Value Date   WBC 4.7 04/24/2012   HGB 9.9* 04/24/2012   HCT 29.5* 04/24/2012  MCV 88.9 04/24/2012   PLT 248 04/24/2012    Recent Labs Lab 04/24/12 0408  NA 143  K 3.1*  CL 101  CO2 36*  BUN 10  CREATININE 0.89  CALCIUM 9.0  GLUCOSE 85   No components found with this basename: MAGNESIUM,  No components found with this basename: POCBNP,  Lab Results  Component Value Date   CHOL 120 12/07/2011   HDL 27* 12/07/2011   LDLCALC 76 12/07/2011   TRIG 83 12/07/2011   Lab Results  Component Value Date   DDIMER 1.44* 09/07/2011    Recent Labs  04/23/12 1251  TSH 2.357   No results found for this basename: VITAMINB12, FOLATE, FERRITIN, TIBC, IRON, RETICCTPCT,  in the last 72 hours No results found for this basename: INR,  in the last 72 hours  Radiology/Studies Dg Chest 2 View  04/23/2012  *RADIOLOGY REPORT*  Clinical Data: Chest pain and hypertension  CHEST - 2 VIEW  Comparison: 03/25/2012  Findings: The heart is again mildly enlarged in size.  Mild interstitial changes are again seen and given some variation and the technical aspects of the film these are stable in appearance. Slight increased  density is noted in the left retrocardiac region likely representing an early infiltrate.  No other focal abnormality is seen.  IMPRESSION: Early left basilar infiltrate.   Original Report Authenticated By: Alcide Clever, M.D.     Echocardiogram  Study Conclusions - Left ventricle: The cavity size was mildly dilated. Wall thickness was increased in a pattern of mild LVH. Systolic function was mildly to moderately reduced. The estimated ejection fraction was in the range of 40% to 45%. Wall motion was normal; there were no regional wall motion abnormalities. - Left atrium: The atrium was mildly dilated. - Right atrium: The atrium was mildly to moderately dilated. - Pulmonary arteries: Systolic pressure was moderately increased.   12-lead ECG on 04/22/2012 shows SR with long 1st degree AV block, PR 322 msec, QRS 92 msec 12-lead ECG on 04/23/2012 shows 2:1 AV block at 35 bpm, narrow QRS Telemetry shows persistent AV block - Wenckebach and 2:1 AV block   Assessment and Plan 1. Recent syncope, now with documented 2:1 AV block 2. Chronic systolic HF, EF 40-45% by echo this admission 3. CAD s/p inferior MI and PCI to RCA 2002 4. HTN 5. Asthma with continued tobacco abuse 6. Unintentional weight loss, moderate malnutrition  Ms. Viveros presents with symptomatic 2:1 AV block. Carvedilol has been held since yesterday. She continues to have intermittent 2:1 AV block. CEs negative but ? need for cardiac cath to rule out CAD progression/RCA involvement as a possible reversible cause. If she has persistent AV block, will need pacing - dual chamber vs BiV PPM in setting of moderate LV dysfunction. Agree with work-up for unintentional/unexplained weight loss.  Dr. Ladona Ridgel to see Signed, Rick Duff, PA-C 04/25/2012, 8:25 AM  EP Attending  Patient seen and examined. I have reviewed the findings of Mrs. Beverly Sessions. She has had symptomatic 2:1 AV block which is now improving with cessation of  beta blocker therapy. She has had chest pain and I would recommend a perfusion study at a minimum with her LV dysfunction. I would continue observation. I suspect she may not need a PPM but with her h/o syncope, would consider a 30 day monitor and if negative an ILR.    Leonia Reeves.D.

## 2012-04-25 NOTE — Progress Notes (Signed)
   SUBJECTIVE:  She has had cough with swallowing.  She is not having chest pain.  No acute SOB.    PHYSICAL EXAM Filed Vitals:   04/25/12 0400 04/25/12 0401 04/25/12 0500 04/25/12 0600  BP: 129/109 136/92 142/71 107/47  Pulse: 92  76   Temp: 98.1 F (36.7 C)     TempSrc: Oral     Resp: 13 16 15 17   Height:      Weight:    141 lb 1.5 oz (64 kg)  SpO2: 100%  99%    General:  No distress Lungs:  Clear Heart:  RRR Abdomen:  Positive bowel sounds, no rebound no guarding Extremities:  No edema  LABS: Lab Results  Component Value Date   CKTOTAL 74 05/02/2011   CKMB 3.2 05/02/2011   TROPONINI <0.30 04/24/2012   Results for orders placed during the hospital encounter of 04/23/12 (from the past 24 hour(s))  GLUCOSE, CAPILLARY     Status: Abnormal   Collection Time    04/24/12  7:52 AM      Result Value Range   Glucose-Capillary 137 (*) 70 - 99 mg/dL  GLUCOSE, CAPILLARY     Status: Abnormal   Collection Time    04/24/12 12:00 PM      Result Value Range   Glucose-Capillary 134 (*) 70 - 99 mg/dL  GLUCOSE, CAPILLARY     Status: Abnormal   Collection Time    04/24/12  4:17 PM      Result Value Range   Glucose-Capillary 150 (*) 70 - 99 mg/dL  GLUCOSE, CAPILLARY     Status: Abnormal   Collection Time    04/24/12  9:31 PM      Result Value Range   Glucose-Capillary 140 (*) 70 - 99 mg/dL    Intake/Output Summary (Last 24 hours) at 04/25/12 1914 Last data filed at 04/25/12 0400  Gross per 24 hour  Intake 1698.15 ml  Output   5200 ml  Net -3501.85 ml    ASSESSMENT AND PLAN:  SYNCOPE:  Telemetry with intermittent Mobitz type I and 2:1 heart block.  She seems to have adequate chronotropic response.  I will ask EP to see however, given bradycardia now off of beta blocker this AM.    HTN:  Her BP is elevated off of beta blocker but fluctuating.  I will not adjust the meds further at this point.    CHF:  seems to be euvolemic.  At this point, no change in therapy is indicated.   We have reviewed salt and fluid restrictions.  No further cardiovascular testing is indicated.   WEIGHT LOSS:  I am quite concerned about this. She also has some symptoms of dysphagia and abdominal discomfort. She has an iron deficiency anemia. I will order stool guaiac and I will ask GI to see her.     Fayrene Fearing [facility] 04/25/2012 7:12 AM

## 2012-04-25 NOTE — Progress Notes (Signed)
Hypoglycemic Event  CBG: 68 Treatment: I/2 cup of orange juice  Symptoms: asymptomatic  Follow-up CBG: Time: 17:00   CBG Result:98  Possible Reasons for Event: poor appetite, pt did drink glucerna  Comments/MD notified: per hypoglycemic protocol    [name]  Remember to initiate Hypoglycemia Order Set & complete

## 2012-04-25 NOTE — Clinical Documentation Improvement (Signed)
MALNUTRITION DOCUMENTATION CLARIFICATION THIS DOCUMENT IS NOT A PERMANENT PART OF THE MEDICAL RECORD Please update your documentation within the medical record to reflect your response to this query. Dear Providers: Based on your clinical judgment, please clarify and document in a progress note and/or discharge summary the clinical condition associated with the following supporting information:In responding to this query please exercise your independent judgment.  The fact that a query is asked, does not imply that any particular answer is desired or expected. Possible Clinical Conditions?  Moderate Malnutrition  Severe Malnutrition    Protein Calorie Malnutrition  Severe Protein Calorie Malnutrition  Other Condition  Cannot clinically determine   Supporting Information: 1. The following clinical indicators are present in the MEDICAL RECORD [id number]. Nutritional consult stated: DOCUMENTATION CODES  Per approved criteria   -Non-severe (moderate) malnutrition in the context of chronic illness   INTERVENTION:  1. Glucerna Shake po TID, each supplement provides 220 kcal and 10 grams of protein.  NUTRITION DIAGNOSIS:  Increased nutrient needs related to CHF/COPD as evidenced by estimated nutrition needs/hx of weight loss.  Goal:  PO intake to meet >/=90% estimated nutrition needs  Monitor:  PO intake, weight trends, labs  Reason for Assessment: Malnutrition Screening Tool  67 y.o. female  Admitting Dx: Chest pain  ASSESSMENT:  Pt presented to cards appointment for syncope. At appointment started to have chest pain and was admitted to hospital.  Pt states that she has lost over 100 lb in the past year. Some is associated with fluid losses after being dx with CHF. Continues to lose weight "every month."  Weight hx on file shows 38 lb (21% body weight) losses in the past 4-5 months. Severe weight loss. Pt is eating well. RN reports pt is requesting snacks often and  completing meals.  Nutrition Focused Physical Exam:  Subcutaneous Fat:  Orbital Region: WNL  Upper Arm Region: WNL  Thoracic and Lumbar Region: WNL  Muscle:  Temple Region: WNL  Clavicle Bone Region: mild wasting  Clavicle and Acromion Bone Region: WNL  Scapular Bone Region: mild wasting  Dorsal Hand: n/a  Patellar Region: mild wasting  Anterior Thigh Region: moderate wasting  Posterior Calf Region: moderate wasting  Edema: none  Pt meets criteria for moderate malnutrition in the context of chronic illness 2/2 weight loss of 21% body weight in 4 months, and mild-moderate muscle wasting evident on physical exam.  Height:  Ht Readings from Last 1 Encounters:   04/23/12  5\' 9"  (1.753 m)    Weight:  Wt Readings from Last 1 Encounters:   04/24/12  141 lb 8.6 oz (64.2 kg)    Ideal Body Weight: 145 lbs  % Ideal Body Weight: 97%  Wt Readings from Last 10 Encounters:   04/24/12  141 lb 8.6 oz (64.2 kg)   04/22/12  155 lb (70.308 kg)   04/22/12  156 lb (70.761 kg)   04/16/12  158 lb 8 oz (71.895 kg)   03/25/12  160 lb 2 oz (72.632 kg)   03/13/12  161 lb (73.029 kg)   03/01/12  163 lb (73.936 kg)   01/17/12  165 lb 1.9 oz (74.898 kg)   12/28/11  176 lb (79.833 kg)   12/22/11  179 lb 4 oz (81.307 kg)    Usual Body Weight: 179 lbs  % Usual Body Weight: 78%  BMI: Body mass index is 20.89 kg/(m^2). WNL  Estimated Nutritional Needs:  Kcal: 1700-1900  Protein: 80-90 gm  Fluid: < 2 L  Skin: intact  Diet Order: Carb Control  EDUCATION NEEDS:  -No education needs identified at this time   Intake/Output Summary (Last 24 hours) at 04/24/12 1122 Last data filed at 04/24/12 1000   Gross per 24 hour   Intake  1693.3 ml   Output  5705 ml   Net  -4011.7 ml    You may use possible, probable, or suspect with inpatient documentation. possible, probable, suspected diagnoses MUST be documented at the time of discharge  Reviewed: additional documentation in the medical record [facility]  [place] addendum  Thank You,  Amada Kingfisher RN, BSN, CCM [email or web address] [phone]  Clinical Documentation Specialist/Health Information Management/[place]

## 2012-04-26 ENCOUNTER — Ambulatory Visit: Payer: Medicare Other

## 2012-04-26 ENCOUNTER — Encounter (HOSPITAL_COMMUNITY)
Admission: EM | Disposition: A | Discharge status: Home Health | Payer: Self-pay | Source: Home / Self Care | Attending: Cardiovascular Disease

## 2012-04-26 DIAGNOSIS — I441 Atrioventricular block, second degree: Principal | ICD-10-CM

## 2012-04-26 LAB — GLUCOSE, CAPILLARY
Glucose-Capillary: 160 mg/dL — ABNORMAL HIGH (ref 70–99)
Glucose-Capillary: 189 mg/dL — ABNORMAL HIGH (ref 70–99)

## 2012-04-26 SURGERY — PERMANENT PACEMAKER INSERTION
Anesthesia: LOCAL

## 2012-04-26 MED ORDER — DOBUTAMINE IN D5W 4-5 MG/ML-% IV SOLN
INTRAVENOUS | Status: AC
  Filled 2012-04-26: qty 250

## 2012-04-26 MED ORDER — DOBUTAMINE INFUSION FOR EP/ECHO/NUC (1000 MCG/ML)
0.0000 ug/kg/min | INTRAVENOUS | Status: DC
  Filled 2012-04-26: qty 250

## 2012-04-26 NOTE — Progress Notes (Signed)
Patient: [name] Date of Encounter: 04/26/2012, 7:56 AM Admit date: 04/23/2012     Subjective  [name] has no new complaints.  Wants to go home   Objective  Physical Exam: Vitals: BP 166/81  Pulse 79  Temp(Src) 98.7 F (37.1 C) (Oral)  Resp 20  Ht 5\' 9"  (1.753 m)  Wt 141 lb 1.5 oz (64 kg)  BMI 20.83 kg/m2  SpO2 97% General: Well developed 67 year old female in no acute distress. Neck: Supple. JVD not elevated. Lungs: Diminished breath sounds but clear bilaterally to auscultation without wheezes, rales, or rhonchi. Breathing is unlabored. Heart: RRR S1 S2 without murmurs, rubs, or gallops.  Abdomen: Soft, non-distended. Extremities: No clubbing or cyanosis. No edema.  Distal pedal pulses are 2+ and equal bilaterally. Neuro: Alert and oriented X 3. Moves all extremities spontaneously. No focal deficits.  Intake/Output:  Intake/Output Summary (Last 24 hours) at 04/26/12 0756 Last data filed at 04/26/12 0600  Gross per 24 hour  Intake 1547.53 ml  Output   1900 ml  Net -352.47 ml    Inpatient Medications:  . aspirin EC  81 mg Oral Daily  . atorvastatin  40 mg Oral Daily  . feeding supplement  237 mL Oral BID BM  . ferrous fumarate  1 tablet Oral BID  . furosemide  80 mg Oral BID  . gabapentin  300 mg Oral TID  . heparin  5,000 Units Subcutaneous Q8H  . hydrALAZINE  25 mg Oral Q8H  . insulin aspart  0-15 Units Subcutaneous TID WC  . irbesartan  300 mg Oral Daily  . isosorbide mononitrate  30 mg Oral Daily  . pantoprazole  40 mg Oral BID  . potassium chloride  20 mEq Oral BID  . sodium chloride  3 mL Intravenous Q12H  . tiotropium  18 mcg Inhalation Daily  . traZODone  150 mg Oral QHS  . venlafaxine XR  150 mg Oral BID   . nitroGLYCERIN 10 mcg/min (04/26/12 0600)    Labs:  Recent Labs  04/24/12 0408 04/25/12 0842  NA 143 140  K 3.1* 3.5  CL 101 97  CO2 36* 36*  GLUCOSE 85 117*  BUN 10 10  CREATININE 0.89 1.03  CALCIUM 9.0 9.1     Recent Labs  04/23/12 0945 04/24/12 0408  WBC 4.0 4.7  HGB 9.9* 9.9*  HCT 29.0* 29.5*  MCV 87.9 88.9  PLT 244 248    Recent Labs  04/23/12 0945 04/23/12 1251 04/23/12 1811 04/24/12 0004  TROPONINI <0.30 <0.30 <0.30 <0.30    Recent Labs  04/23/12 1251  TSH 2.357    Radiology/Studies: Dg Chest 2 View  04/23/2012  *RADIOLOGY REPORT*  Clinical Data: Chest pain and hypertension  CHEST - 2 VIEW  Comparison: 03/25/2012  Findings: The heart is again mildly enlarged in size.  Mild interstitial changes are again seen and given some variation and the technical aspects of the film these are stable in appearance. Slight increased density is noted in the left retrocardiac region likely representing an early infiltrate.  No other focal abnormality is seen.  IMPRESSION: Early left basilar infiltrate.   Original Report Authenticated By: Alcide Clever, M.D.     Telemetry: SR with intermittent, with frequent PACs and intermittent mobitz I second degree AV block    Assessment and Plan  1. Recent syncope, now with second degree AV block  2. Chronic systolic HF, EF 40-45% by echo this admission  3. CAD s/p  inferior MI and PCI to RCA 2002  4. HTN  5. Asthma with continued tobacco abuse  6. Unintentional weight loss, moderate malnutrition   [name] presents with symptomatic second degree AV block. Carvedilol has been held since admission. This will be day 2 without AV nodal meds on board. She continues to have intermittent, brief 2:1 AV block. CEs negative. If she has persistent AV block, will need pacing - probable BiV PPM in setting of moderate LV dysfunction. Agree with work-up for unintentional/unexplained weight loss.  Dr. Graciela Husbands to see Signed, EDMISTEN, BROOKE PA-C  I have seen, examined the patient, and reviewed the above assessment and plan.  Changes to above are made where necessary.  The patient presented initially with symptomatic 2:1 AV block.  She is now having frequent  PACs with wenchebach and is asymptomatic with this in the setting of coreg wash out. Her CP is resolved though she remains on nitro drip.  I will stop nitro.  Her EF is chronically depressed at 40-45%.  We will proceed with dobutamine myoview later today for further CV risk stratification.  If low risk, then medical management is advised. Will continue to observe rhythm and make decisions about pacing after myoview is complete as coreg continues to wash out.  Co Sign: Hillis Range, MD 04/26/2012 10:30 AM

## 2012-04-27 ENCOUNTER — Inpatient Hospital Stay (HOSPITAL_COMMUNITY): Payer: Medicare Other

## 2012-04-27 DIAGNOSIS — R55 Syncope and collapse: Secondary | ICD-10-CM

## 2012-04-27 LAB — GLUCOSE, CAPILLARY
Glucose-Capillary: 104 mg/dL — ABNORMAL HIGH (ref 70–99)
Glucose-Capillary: 225 mg/dL — ABNORMAL HIGH (ref 70–99)
Glucose-Capillary: 61 mg/dL — ABNORMAL LOW (ref 70–99)

## 2012-04-27 MED ORDER — TECHNETIUM TC 99M SESTAMIBI - CARDIOLITE
30.0000 | Freq: Once | INTRAVENOUS | Status: AC | PRN
  Administered 2012-04-27: 11:00:00 30 via INTRAVENOUS

## 2012-04-27 MED ORDER — TECHNETIUM TC 99M SESTAMIBI - CARDIOLITE
10.0000 | Freq: Once | INTRAVENOUS | Status: AC | PRN
  Administered 2012-04-27: 10 via INTRAVENOUS

## 2012-04-27 MED ORDER — DOBUTAMINE INFUSION FOR EP/ECHO/NUC (1000 MCG/ML)
INTRAVENOUS | Status: AC
  Administered 2012-04-27: 10 ug/kg/min
  Filled 2012-04-27: qty 250

## 2012-04-27 NOTE — Progress Notes (Signed)
Received pt to [id number] placed on tele, pt stated she was not feeling well, shaky with slurred speech cbg checked 61 given oral fluids and snack symptoms relieved. Will recheck and monitor.

## 2012-04-27 NOTE — Progress Notes (Signed)
Patient: [name] Date of Encounter: 04/27/2012, 11:16 AM Admit date: 04/23/2012     Subjective  Mrs. Humbarger has no new complaints.  CP is resolved.  Denies SOB, dizziness, fatigue, presyncope, or syncope. Wants to go home   Objective  Physical Exam: Vitals: BP 152/91  Pulse 94  Temp(Src) 98.6 F (37 C) (Oral)  Resp 16  Ht 5\' 9"  (1.753 m)  Wt 143 lb 8.3 oz (65.1 kg)  BMI 21.18 kg/m2  SpO2 96% General: Well developed 67 year old female in no acute distress. Neck: Supple. JVD not elevated. Lungs: Diminished breath sounds but clear bilaterally to auscultation without wheezes, rales, or rhonchi. Breathing is unlabored. Heart: RRR S1 S2 without murmurs, rubs, or gallops.  Abdomen: Soft, non-distended. Extremities: No clubbing or cyanosis. No edema.  Distal pedal pulses are 2+ and equal bilaterally. Neuro: Alert and oriented X 3. Moves all extremities spontaneously. No focal deficits.  Intake/Output:  Intake/Output Summary (Last 24 hours) at 04/27/12 1116 Last data filed at 04/27/12 0800  Gross per 24 hour  Intake    710 ml  Output   4650 ml  Net  -3940 ml    Inpatient Medications:  . aspirin EC  81 mg Oral Daily  . atorvastatin  40 mg Oral Daily  . feeding supplement  237 mL Oral BID BM  . ferrous fumarate  1 tablet Oral BID  . furosemide  80 mg Oral BID  . gabapentin  300 mg Oral TID  . heparin  5,000 Units Subcutaneous Q8H  . hydrALAZINE  25 mg Oral Q8H  . insulin aspart  0-15 Units Subcutaneous TID WC  . irbesartan  300 mg Oral Daily  . isosorbide mononitrate  30 mg Oral Daily  . pantoprazole  40 mg Oral BID  . potassium chloride  20 mEq Oral BID  . sodium chloride  3 mL Intravenous Q12H  . tiotropium  18 mcg Inhalation Daily  . traZODone  150 mg Oral QHS  . venlafaxine XR  150 mg Oral BID      Labs:  Recent Labs  04/25/12 0842  NA 140  K 3.5  CL 97  CO2 36*  GLUCOSE 117*  BUN 10  CREATININE 1.03  CALCIUM 9.1   No results found  for this basename: WBC, NEUTROABS, HGB, HCT, MCV, PLT,  in the last 72 hours No results found for this basename: CKTOTAL, CKMB, TROPONINI,  in the last 72 hours No results found for this basename: TSH, T4TOTAL, FREET3, T3FREE, THYROIDAB,  in the last 72 hours  Radiology/Studies: Dg Chest 2 View  04/23/2012  *RADIOLOGY REPORT*  Clinical Data: Chest pain and hypertension  CHEST - 2 VIEW  Comparison: 03/25/2012  Findings: The heart is again mildly enlarged in size.  Mild interstitial changes are again seen and given some variation and the technical aspects of the film these are stable in appearance. Slight increased density is noted in the left retrocardiac region likely representing an early infiltrate.  No other focal abnormality is seen.  IMPRESSION: Early left basilar infiltrate.   Original Report Authenticated By: Alcide Clever, M.D.     Telemetry: SR with intermittent, with frequent PACs and intermittent mobitz I second degree AV block    Assessment and Plan  1. Recent syncope, now with second degree AV block  2. Chronic systolic HF, EF 40-45% by echo this admission  3. CAD s/p inferior MI and PCI to RCA 2002  4. HTN  5.  Asthma with continued tobacco abuse  6. Unintentional weight loss, moderate malnutrition   1. With dobutamine, 1:1 conduction,  No evidence of infrahisian block.  She appears to be tolerating her mobitz I Av block without symptoms.  We will therefore plan to avoid PPM.  2. Chest pain- resolved If myoview is low risk, will plan discharge later today If myoview is abnormal then she may need further coronary assessment.    Hillis Range, MD 04/27/2012 11:16 AM

## 2012-04-27 NOTE — Progress Notes (Signed)
Report called to [facility] RN on [phone]

## 2012-04-27 NOTE — Progress Notes (Signed)
Dobutamine stress test completed. Max HR 133. Target 129. BP peak 184/83. Reached with dobutamine 50 microL/kg/min and bed bound exercise. Maintained infusion x 1 minute after target reached and contrast administered. Tolerated well. Mild nausea which resolved after 1 minute recovery. Further plan to be guided by image interpretation.    Jacqulyn Bath, PA-C 04/27/2012 10:58 AM

## 2012-04-27 NOTE — Progress Notes (Signed)
Stress test results called to [facility]; pt with no current complaints; OOB to [facility] with O2 and telemetry; spouse at bedside; questions encouraged and answered

## 2012-04-28 DIAGNOSIS — R079 Chest pain, unspecified: Secondary | ICD-10-CM

## 2012-04-28 DIAGNOSIS — I2510 Atherosclerotic heart disease of native coronary artery without angina pectoris: Secondary | ICD-10-CM

## 2012-04-28 LAB — GLUCOSE, CAPILLARY
Glucose-Capillary: 101 mg/dL — ABNORMAL HIGH (ref 70–99)
Glucose-Capillary: 119 mg/dL — ABNORMAL HIGH (ref 70–99)
Glucose-Capillary: 153 mg/dL — ABNORMAL HIGH (ref 70–99)

## 2012-04-28 LAB — PROTIME-INR: Prothrombin Time: 13.1 seconds (ref 11.6–15.2)

## 2012-04-28 MED ORDER — DIAZEPAM 2 MG PO TABS
2.0000 mg | ORAL_TABLET | ORAL | Status: AC
  Administered 2012-04-29: 2 mg via ORAL
  Filled 2012-04-28: qty 1

## 2012-04-28 MED ORDER — SODIUM CHLORIDE 0.9 % IV SOLN: 250.0000 mL | INTRAVENOUS | Status: DC | PRN

## 2012-04-28 MED ORDER — SODIUM CHLORIDE 0.9 % IJ SOLN: 3.0000 mL | INTRAMUSCULAR | Status: DC | PRN

## 2012-04-28 MED ORDER — SODIUM CHLORIDE 0.9 % IJ SOLN
3.0000 mL | Freq: Two times a day (BID) | INTRAMUSCULAR | Status: DC
  Administered 2012-04-28 – 2012-04-29 (×2): 3 mL via INTRAVENOUS

## 2012-04-28 NOTE — Progress Notes (Signed)
Patient ID: [name], female   DOB: Apr 21, 1945, 67 y.o.   MRN: [id number] Subjective:  "I am ready to get that test over with. I want to go home"  Objective:  Vital Signs in the last 24 hours: Temp:  [98 F (36.7 C)-98.9 F (37.2 C)] 98.1 F (36.7 C) (04/27 0738) Pulse Rate:  [45-116] 83 (04/27 0738) Resp:  [15-20] 16 (04/27 0738) BP: (140-174)/(65-83) 140/66 mmHg (04/27 0738) SpO2:  [92 %-100 %] 100 % (04/27 0834) Weight:  [143 lb 8 oz (65.091 kg)] 143 lb 8 oz (65.091 kg) (04/27 0738)  Intake/Output from previous day: 04/26 0701 - 04/27 0700 In: 732 [P.O.:732] Out: 3050 [Urine:3050] Intake/Output from this shift:    Physical Exam: Well appearing NAD HEENT: Unremarkable Neck:  7 cm JVD, no thyromegally Back:  No CVA tenderness Lungs:  Clear with no wheezes  HEART:  IRegular rate rhythm, no murmurs, no rubs, no clicks Abd:  Flat, positive bowel sounds, no organomegally, no rebound, no guarding Ext:  2 plus pulses, no edema, no cyanosis, no clubbing Skin:  No rashes no nodules Neuro:  CN II through XII intact, motor grossly intact  Lab Results: No results found for this basename: WBC, HGB, PLT,  in the last 72 hours No results found for this basename: NA, K, CL, CO2, GLUCOSE, BUN, CREATININE,  in the last 72 hours No results found for this basename: TROPONINI, CK, MB,  in the last 72 hours Hepatic Function Panel No results found for this basename: PROT, ALBUMIN, AST, ALT, ALKPHOS, BILITOT, BILIDIR, IBILI,  in the last 72 hours No results found for this basename: CHOL,  in the last 72 hours No results found for this basename: PROTIME,  in the last 72 hours  Imaging: Nm Myocar Multi W/spect W/wall Motion / Ef  04/27/2012  *RADIOLOGY REPORT*  Clinical Data:  Syncope.  Abnormal EKG.  Smoker.  Hypertension. Coronary artery disease.  CHF.  Technique:  Standard myocardial SPECT imaging performed after resting intravenous injection of 10 mCi Tc-28m sestamibi. Subsequently,  intravenous infusion of dobutamine performed under the supervision of the Cardiology staff.  At peak effect of the drug, 32.4 mCi Tc-81msestamibi injected intravenously and standard myocardial SPECT imaging performed.  Quantitative gated imaging also performed to evaluate left ventricular wall motion and estimate left ventricular ejection fraction.  Comparison:  Report 08/31/2011  MYOCARDIAL IMAGING WITH SPECT (REST AND PHARMACOLOGIC-STRESS)  Findings:  Fixed defect involves the entire inferior wall and the entire inferior lateral wall.  Abnormal reversibility involves the anterior apex as well as the apical segments of the anterior wall, apical segments of the septum and anterior lateral wall.  GATED LEFT VENTRICULAR WALL MOTION STUDY  Findings:  Review of the gated images demonstrates marked diminished to wall motion and thickening.  LEFT VENTRICULAR EJECTION FRACTION  Findings:  QGS ejection fraction measures 14% , with an end- diastolic volume of 219 ml and an end-systolic volume of 187 ml.  IMPRESSION:  1.  Large infarct involves the inferior and inferior lateral wall. 2.  Evidence of peri infarct ischemia involving the apical segments of the anterior lateral wall, anterior wall and septum. 3.  Markedly diminished.  Left ventricular systolic function.  The left ventricular ejection fraction equals 14%.  These results will be called to the ordering clinician or representative by the Radiologist Assistant, and communication documented in the PACS Dashboard.   Original Report Authenticated By: Signa Kell, M.D.     Cardiac Studies: Tele - NSR with  AVWB Assessment/Plan:  1. AVWB 2. CAD 3. Atypical chest pain 4. High risk nuclear stress test 5. Weight loss Rec: left heart cath tomorrow. Continue meds.   LOS: 5 days    [name] 04/28/2012, 10:40 AM

## 2012-04-29 ENCOUNTER — Encounter (HOSPITAL_COMMUNITY)
Admission: EM | Disposition: A | Discharge status: Home Health | Payer: Self-pay | Source: Home / Self Care | Attending: Cardiovascular Disease

## 2012-04-29 DIAGNOSIS — I2510 Atherosclerotic heart disease of native coronary artery without angina pectoris: Secondary | ICD-10-CM

## 2012-04-29 DIAGNOSIS — I10 Essential (primary) hypertension: Secondary | ICD-10-CM

## 2012-04-29 HISTORY — PX: LEFT HEART CATHETERIZATION WITH CORONARY ANGIOGRAM: SHX5451

## 2012-04-29 LAB — CBC
HCT: 31.3 % — ABNORMAL LOW (ref 36.0–46.0)
MCV: 87.9 fL (ref 78.0–100.0)
Platelets: 277 10*3/uL (ref 150–400)
RBC: 3.56 MIL/uL — ABNORMAL LOW (ref 3.87–5.11)
RDW: 16.5 % — ABNORMAL HIGH (ref 11.5–15.5)
WBC: 3.9 10*3/uL — ABNORMAL LOW (ref 4.0–10.5)

## 2012-04-29 LAB — BASIC METABOLIC PANEL
CO2: 35 mEq/L — ABNORMAL HIGH (ref 19–32)
Chloride: 94 mEq/L — ABNORMAL LOW (ref 96–112)
Creatinine, Ser: 0.89 mg/dL (ref 0.50–1.10)
GFR calc Af Amer: 75 mL/min — ABNORMAL LOW (ref >90)
Sodium: 135 mEq/L (ref 135–145)

## 2012-04-29 LAB — GLUCOSE, CAPILLARY
Glucose-Capillary: 99 mg/dL (ref 70–99)
Glucose-Capillary: 107 mg/dL — ABNORMAL HIGH (ref 70–99)

## 2012-04-29 SURGERY — LEFT HEART CATHETERIZATION WITH CORONARY ANGIOGRAM
Anesthesia: LOCAL

## 2012-04-29 MED ORDER — HEPARIN (PORCINE) IN NACL 2-0.9 UNIT/ML-% IJ SOLN
INTRAMUSCULAR | Status: AC
  Filled 2012-04-29: qty 1000

## 2012-04-29 MED ORDER — SODIUM CHLORIDE 0.9 % IV SOLN: INTRAVENOUS | Status: AC

## 2012-04-29 MED ORDER — HYDRALAZINE HCL 50 MG PO TABS
50.0000 mg | ORAL_TABLET | Freq: Three times a day (TID) | ORAL | Status: DC
  Administered 2012-04-29 – 2012-04-30 (×4): 50 mg via ORAL
  Filled 2012-04-29 (×6): qty 1

## 2012-04-29 MED ORDER — FENTANYL CITRATE 0.05 MG/ML IJ SOLN
INTRAMUSCULAR | Status: AC
  Filled 2012-04-29: qty 2

## 2012-04-29 MED ORDER — SODIUM CHLORIDE 0.9 % IV SOLN
INTRAVENOUS | Status: DC
  Administered 2012-04-29: 10 mL/h via INTRAVENOUS

## 2012-04-29 MED ORDER — ISOSORBIDE MONONITRATE ER 60 MG PO TB24
60.0000 mg | ORAL_TABLET | Freq: Every day | ORAL | Status: DC
  Administered 2012-04-29 – 2012-04-30 (×2): 60 mg via ORAL
  Filled 2012-04-29 (×2): qty 1

## 2012-04-29 MED ORDER — LIDOCAINE HCL (PF) 1 % IJ SOLN
INTRAMUSCULAR | Status: AC
  Filled 2012-04-29: qty 30

## 2012-04-29 MED ORDER — VERAPAMIL HCL 2.5 MG/ML IV SOLN
INTRAVENOUS | Status: AC
  Filled 2012-04-29: qty 2

## 2012-04-29 MED ORDER — MIDAZOLAM HCL 2 MG/2ML IJ SOLN
INTRAMUSCULAR | Status: AC
  Filled 2012-04-29: qty 2

## 2012-04-29 MED ORDER — HEPARIN SODIUM (PORCINE) 1000 UNIT/ML IJ SOLN
INTRAMUSCULAR | Status: AC
  Filled 2012-04-29: qty 1

## 2012-04-29 NOTE — H&P (View-Only) (Signed)
   SUBJECTIVE:  She complains mostly of problems with her bowels. She describes some abdominal pain.   She is not having chest pain.  No acute SOB.    PHYSICAL EXAM Filed Vitals:   04/28/12 1502 04/28/12 2016 04/29/12 0300 04/29/12 0458  BP: 173/78 170/90  149/86  Pulse: 61   78  Temp: 98 F (36.7 C) 98.1 F (36.7 C)  98 F (36.7 C)  TempSrc: Oral Oral  Oral  Resp: 18 18  18  Height:      Weight:   143 lb 4.8 oz (65 kg) 143 lb 4.8 oz (65 kg)  SpO2: 100% 100%  100%   General:  No distress Lungs:  Clear Heart:  RRR Abdomen:  Positive bowel sounds, no rebound no guarding Extremities:  No edema  LABS:  Results for orders placed during the hospital encounter of 04/23/12 (from the past 24 hour(s))  GLUCOSE, CAPILLARY     Status: Abnormal   Collection Time    04/28/12 11:53 AM      Result Value Range   Glucose-Capillary 153 (*) 70 - 99 mg/dL  PROTIME-INR     Status: None   Collection Time    04/28/12  4:05 PM      Result Value Range   Prothrombin Time 13.1  11.6 - 15.2 seconds   INR 1.00  0.00 - 1.49  GLUCOSE, CAPILLARY     Status: Abnormal   Collection Time    04/28/12  4:26 PM      Result Value Range   Glucose-Capillary 129 (*) 70 - 99 mg/dL  GLUCOSE, CAPILLARY     Status: Abnormal   Collection Time    04/28/12  9:31 PM      Result Value Range   Glucose-Capillary 139 (*) 70 - 99 mg/dL  GLUCOSE, CAPILLARY     Status: None   Collection Time    04/29/12  5:52 AM      Result Value Range   Glucose-Capillary 99  70 - 99 mg/dL   Comment 1 Notify RN      Intake/Output Summary (Last 24 hours) at 04/29/12 0756 Last data filed at 04/29/12 0500  Gross per 24 hour  Intake    849 ml  Output   5175 ml  Net  -4326 ml    ASSESSMENT AND PLAN:  SYNCOPE:  Telemetry with intermittent Mobitz type I and 2:1 heart block.  She seems to have adequate chronotropic response.  No indication for pacing off of beta blockers at this point.   HTN:  Her BP is elevated off of beta  blocker but fluctuating.  Increase hydralazine and nitrates.   CHF:  Seems to be euvolemic.  Multiple perfusion defects on the Lexiscan Myoview.  Cath today.    WEIGHT LOSS:  I am quite concerned about this. She also has some symptoms of dysphagia and abdominal discomfort. She has an iron deficiency anemia. It does not appear that GI got the message to please consult on this patient.  I will likely arrange this as an outpatient.  Guaiac was negative x 3.     [name] 04/29/2012 7:56 AM   

## 2012-04-29 NOTE — Progress Notes (Signed)
Utilization Review Completed Addy Mcmannis J. Fama Muenchow, RN, BSN, NCM [phone]  

## 2012-04-29 NOTE — Progress Notes (Signed)
TR BAND REMOVAL  LOCATION:    right radial  DEFLATED PER PROTOCOL:    yes  TIME BAND OFF / DRESSING APPLIED:    1530   SITE UPON ARRIVAL:    Level 0  SITE AFTER BAND REMOVAL:    Level 0  REVERSE ALLEN'S TEST:     positive  CIRCULATION SENSATION AND MOVEMENT:    Within Normal Limits   yes  COMMENTS:   1600 Gauze dressing remains dry and intact. Positive reverse allens and csms wnls

## 2012-04-29 NOTE — Progress Notes (Signed)
   SUBJECTIVE:  She complains mostly of problems with her bowels. She describes some abdominal pain.   She is not having chest pain.  No acute SOB.    PHYSICAL EXAM Filed Vitals:   04/28/12 1502 04/28/12 2016 04/29/12 0300 04/29/12 0458  BP: 173/78 170/90  149/86  Pulse: 61   78  Temp: 98 F (36.7 C) 98.1 F (36.7 C)  98 F (36.7 C)  TempSrc: Oral Oral  Oral  Resp: 18 18  18   Height:      Weight:   143 lb 4.8 oz (65 kg) 143 lb 4.8 oz (65 kg)  SpO2: 100% 100%  100%   General:  No distress Lungs:  Clear Heart:  RRR Abdomen:  Positive bowel sounds, no rebound no guarding Extremities:  No edema  LABS:  Results for orders placed during the hospital encounter of 04/23/12 (from the past 24 hour(s))  GLUCOSE, CAPILLARY     Status: Abnormal   Collection Time    04/28/12 11:53 AM      Result Value Range   Glucose-Capillary 153 (*) 70 - 99 mg/dL  PROTIME-INR     Status: None   Collection Time    04/28/12  4:05 PM      Result Value Range   Prothrombin Time 13.1  11.6 - 15.2 seconds   INR 1.00  0.00 - 1.49  GLUCOSE, CAPILLARY     Status: Abnormal   Collection Time    04/28/12  4:26 PM      Result Value Range   Glucose-Capillary 129 (*) 70 - 99 mg/dL  GLUCOSE, CAPILLARY     Status: Abnormal   Collection Time    04/28/12  9:31 PM      Result Value Range   Glucose-Capillary 139 (*) 70 - 99 mg/dL  GLUCOSE, CAPILLARY     Status: None   Collection Time    04/29/12  5:52 AM      Result Value Range   Glucose-Capillary 99  70 - 99 mg/dL   Comment 1 Notify RN      Intake/Output Summary (Last 24 hours) at 04/29/12 0756 Last data filed at 04/29/12 0500  Gross per 24 hour  Intake    849 ml  Output   5175 ml  Net  -4326 ml    ASSESSMENT AND PLAN:  SYNCOPE:  Telemetry with intermittent Mobitz type I and 2:1 heart block.  She seems to have adequate chronotropic response.  No indication for pacing off of beta blockers at this point.   HTN:  Her BP is elevated off of beta  blocker but fluctuating.  Increase hydralazine and nitrates.   CHF:  Seems to be euvolemic.  Multiple perfusion defects on the Lexiscan Myoview.  Cath today.    WEIGHT LOSS:  I am quite concerned about this. She also has some symptoms of dysphagia and abdominal discomfort. She has an iron deficiency anemia. It does not appear that GI got the message to please consult on this patient.  I will likely arrange this as an outpatient.  Guaiac was negative x 3.     [name] 04/29/2012 7:56 AM

## 2012-04-29 NOTE — Interval H&P Note (Signed)
History and Physical Interval Note:  04/29/2012 10:30 AM  [name]  has presented today for cardiac cath with the diagnosis of CAD, syncope, chest pain. The various methods of treatment have been discussed with the patient and family. After consideration of risks, benefits and other options for treatment, the patient has consented to  Procedure(s): LEFT HEART CATHETERIZATION WITH CORONARY ANGIOGRAM (N/A) as a surgical intervention .  The patient's history has been reviewed, patient examined, no change in status, stable for surgery.  I have reviewed the patient's chart and labs.  Questions were answered to the patient's satisfaction.     Lu Paradise

## 2012-04-29 NOTE — CV Procedure (Signed)
Cardiac Catheterization Operative Report  [name] [id number] 4/28/201410:27 AM [name], MD  Procedure Performed:  1. Left Heart Catheterization 2. Selective Coronary Angiography 3. Left ventricular angiogram  Operator: Verne Carrow, MD  Arterial access site:  Right radial artery.   Indication: 67 yo female with history of CAD, s/p MI in 2002 treated with RCA stent, DM2, HTN, HL, CHF, sleep apnea, GERD, asthma, tobacco abuse, iron deficiency anemia admitted with syncope, chest pain and found to have second degree AV block type 1. She has been seen by EP and beta blocker has been held. No plans for pacemaker. Last LHC 05/02/11: distal LAD 99% unchanged from 2010 (too small for PCI), mid and distal RCA stents patent, pRCA 30% before stent, EF 35%.                                     Procedure Details: The risks, benefits, complications, treatment options, and expected outcomes were discussed with the patient. The patient and/or family concurred with the proposed plan, giving informed consent. The patient was brought to the cath lab after IV hydration was begun and oral premedication was given. The patient was further sedated with Versed. The right wrist was assessed with an Allens test which was positive. The right wrist was prepped and draped in a sterile fashion. 1% lidocaine was used for local anesthesia. Using the modified Seldinger access technique, a 5 French sheath was placed in the right radial artery. 3 mg Verapamil was given through the sheath. 4000 units IV heparin was given. Standard diagnostic catheters were used to perform selective coronary angiography. A pigtail catheter was used to perform a left ventricular angiogram. The sheath was removed from the right radial artery and a Terumo hemostasis band was applied at the arteriotomy site on the right wrist.    There were no immediate complications. The patient was taken to the recovery area in stable  condition.   Hemodynamic Findings: Central aortic pressure: 153/78 Left ventricular pressure: 151/11/11  Angiographic Findings:   Left main: 20% plaque.   Left Anterior Descending Artery: Large caliber vessel that courses to the apex. The proximal and mid vessel has mild plaque disease. The distal vessel becomes very small in caliber and has diffuse disease. There appears to be a focal 99% stenosis but this is in the very distal, smaller segment as it approaches the apex. This is too small for PCI. Unchanged from cath in 2010 and 2013.  Circumflex Artery: Moderate sized vessel with mild plaque in mid vessel.   Right Coronary Artery: Large dominant vessel with patent stents in the mid and distal vessel. There is a 30% stenosis in the proximal vessel just before the stent. The mid stent is patent with mild restenosis. The mid vessel has a focal 40-50% stenosis. The distal stent is patent with mild in stent restenosis. The PL branch and PDA are small to moderate sized, long branches. The PDA becomes very small caliber and has serial 99% stenoses in this very small caliber segment of the vessel. (1.0 mm). The posterolateral branches have mild plaque disease.   Left Ventricular Angiogram: LVEF=20% with global hypokinesis.   Impression:  1. 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  2. Moderate to severe global LV systolic dysfunction.   Recommendations: Continue medical management of CAD. No focal targets for PCI.  Complications:  None. The patient tolerated the procedure well.

## 2012-04-30 ENCOUNTER — Encounter: Payer: Self-pay | Admitting: Gastroenterology

## 2012-04-30 DIAGNOSIS — I5022 Chronic systolic (congestive) heart failure: Secondary | ICD-10-CM | POA: Diagnosis present

## 2012-04-30 DIAGNOSIS — I2510 Atherosclerotic heart disease of native coronary artery without angina pectoris: Secondary | ICD-10-CM

## 2012-04-30 DIAGNOSIS — I200 Unstable angina: Secondary | ICD-10-CM | POA: Diagnosis present

## 2012-04-30 LAB — GLUCOSE, CAPILLARY
Glucose-Capillary: 145 mg/dL — ABNORMAL HIGH (ref 70–99)
Glucose-Capillary: 135 mg/dL — ABNORMAL HIGH (ref 70–99)

## 2012-04-30 MED ORDER — ISOSORBIDE MONONITRATE ER 60 MG PO TB24: 60.0000 mg | ORAL_TABLET | Freq: Every day | ORAL | Status: DC

## 2012-04-30 MED ORDER — HYDRALAZINE HCL 50 MG PO TABS: 50.0000 mg | ORAL_TABLET | Freq: Three times a day (TID) | ORAL | Status: DC

## 2012-04-30 MED ORDER — NITROGLYCERIN 0.4 MG SL SUBL: 0.4000 mg | SUBLINGUAL_TABLET | SUBLINGUAL | Status: DC | PRN

## 2012-04-30 MED ORDER — SITAGLIPTIN PHOS-METFORMIN HCL 50-500 MG PO TABS: 1.0000 | ORAL_TABLET | Freq: Two times a day (BID) | ORAL | Status: DC

## 2012-04-30 MED ORDER — GLUCERNA SHAKE PO LIQD: 237.0000 mL | Freq: Two times a day (BID) | ORAL | Status: DC

## 2012-04-30 NOTE — Progress Notes (Signed)
   SUBJECTIVE:  She still complains mostly of problems with her bowels. She describes some abdominal pain.   She is not having chest pain.  No acute SOB.  She is very anxious to go home.    PHYSICAL EXAM Filed Vitals:   04/29/12 1937 04/30/12 0030 04/30/12 0606 04/30/12 0800  BP: 167/88 167/81 175/101 106/47  Pulse: 76 63 77 70  Temp: 98 F (36.7 C) 97.6 F (36.4 C) 98.3 F (36.8 C) 98.2 F (36.8 C)  TempSrc: Oral Oral Oral Oral  Resp:      Height:      Weight:   141 lb 12.1 oz (64.3 kg)   SpO2: 99% 99% 100% 100%   General:  No distress Lungs:  Clear Heart:  RRR Abdomen:  Positive bowel sounds, no rebound no guarding Extremities:  No edema, right wrist without bruising  LABS:  Results for orders placed during the hospital encounter of 04/23/12 (from the past 24 hour(s))  GLUCOSE, CAPILLARY     Status: Abnormal   Collection Time    04/29/12 12:20 PM      Result Value Range   Glucose-Capillary 113 (*) 70 - 99 mg/dL  GLUCOSE, CAPILLARY     Status: Abnormal   Collection Time    04/29/12  5:38 PM      Result Value Range   Glucose-Capillary 147 (*) 70 - 99 mg/dL  GLUCOSE, CAPILLARY     Status: Abnormal   Collection Time    04/29/12 10:10 PM      Result Value Range   Glucose-Capillary 107 (*) 70 - 99 mg/dL   Comment 1 Notify RN     Comment 2 Documented in Chart    GLUCOSE, CAPILLARY     Status: Abnormal   Collection Time    04/30/12  8:22 AM      Result Value Range   Glucose-Capillary 145 (*) 70 - 99 mg/dL    Intake/Output Summary (Last 24 hours) at 04/30/12 1151 Last data filed at 04/30/12 0900  Gross per 24 hour  Intake   1080 ml  Output   4200 ml  Net  -3120 ml    ASSESSMENT AND PLAN:  SYNCOPE:  No further events or significant bradycardia off beta blocker.  I will avoid these for now.    HTN:  Her BP is elevated off of beta blocker but fluctuating.  I increase hydralazine and nitrates yesterday.   BP is somewhat labile.  I will follow as an outpatient.    CHF:  Seems to be euvolemic. Status post cath.  Medical management.  Her EF is closer to 20%.  I reviewed the echo and the reported EF is incorrect.    WEIGHT LOSS:  I am quite concerned about this. She also has some symptoms of dysphagia and abdominal discomfort. She has an iron deficiency anemia. It does not appear that GI got the message to please consult on this patient. Guaiac was negative x 3.  We need to schedule an appt with GI as an outpatient.    Fayrene Fearing [facility] 04/30/2012 11:51 AM

## 2012-04-30 NOTE — Discharge Summary (Signed)
CARDIOLOGY DISCHARGE SUMMARY   Patient ID: [name] MRN: [id number] DOB/AGE: 04/19/1945 67 y.o.  Admit date: 04/23/2012 Discharge date: 04/30/2012  Primary Discharge Diagnosis:   Intermediate coronary syndrome - medical therapy for coronary artery disease   Secondary Discharge Diagnosis:    Syncope   Type II or unspecified type diabetes mellitus without mention of complication, uncontrolled   HYPERTENSION   CORONARY ARTERY DISEASE   GERD   Iron deficiency anemia   Chronic systolic CHF  Consults: Electrophysiology  Procedure Performed:  1. Left Heart Catheterization 2. Selective Coronary Angiography 3. Left ventricular angiogram 4. 2-D echocardiogram 5. Dobutamine [facility] Course: [name] Ehrich Care is a 67 y.o. female with a history of CAD. She had been seen in the office for syncope and came back to pick up an event monitor. While in the waiting room, she had onset of substernal chest pain and decreased level of consciousness. She was found to have 2-1 heart block. She was taken to the emergency room and admitted for further evaluation and treatment.  1. Chest pain, angina: Her cardiac enzymes were negative for MI. Her chest pain resolved with medical therapy. A Dobutamine Myoview was performed which showed significant abnormalities. She was taken to the cath lab on 04/29/2012. Full cardiac catheterization results are below but medical therapy was recommended for significant coronary artery disease. Her medications were adjusted and she is currently on aspirin and nitrates but no beta blocker because of bradycardia. She is currently ambulating without chest pain or shortness of breath.  2. Syncope: 2-1 heart block was seen on telemetry. She had previously been on high-dose carvedilol but this was discontinued. Her heart rate improved. She did not have chronotropic incompetence as her heart rate reached target during the dobutamine Myoview. Dr Ladona Ridgel recommended  continued observation, consider a 30 day monitor and possibly a loop recorder.  3. diabetes: She had been on Janumet prior to admission. This was initially held and she was managed with sliding scale insulin. She will be restarted on the Janumet at discharge.  4. hypertension: Her blood pressure had been labile. Her medications were adjusted but her blood pressure remained somewhat labile. [name] will follow with her as an outpatient.  5. Gastroesophageal reflux disease: She had some dysphagia and some abdominal discomfort. She had some weight loss that was felt to be unexplained. She was continued on her home medications and is to followup with GI as an outpatient. An appointment was made.  6. Iron-deficiency anemia: Stools were guaiaced but were negative. She had been on iron prior to admission and this will be continued at discharge. Her hemoglobin and hematocrit were followed during her hospital stay and are stable to slightly improved at discharge.  7. Chronic systolic CHF class III: She was monitored closely during her hospital stay. She had some decrease in her weight with an admission weight of 148 pounds and a discharge weight of 141. She did not require IV Lasix but was maintained on a low sodium diabetic diet and continued on oral medications. She is encouraged to be compliant with dietary restrictions and medication as an outpatient.  With her multiple medical problems, there was concern for an early readmission. She has chronic systolic CHF and there is concern for her ability to monitor her volume status as an outpatient. Home health RN was arranged and we'll follow her as an outpatient. A 04/30/2012, [name] had improved significantly from a clinical standpoint. She was evaluated by Dr.  Hochrein and considered stable for discharge, to follow up as an outpatient.  Labs:   Lab Results  Component Value Date   WBC 3.9* 04/29/2012   HGB 10.7* 04/29/2012   HCT 31.3* 04/29/2012     MCV 87.9 04/29/2012   PLT 277 04/29/2012     Recent Labs Lab 04/29/12 0905  NA 135  K 4.2  CL 94*  CO2 35*  BUN 10  CREATININE 0.89  CALCIUM 9.5  GLUCOSE 114*   Pro B Natriuretic peptide (BNP)  Date/Time Value Range Status  04/23/2012  9:45 AM 11818.0* 0 - 125 pg/mL Final  12/05/2011 11:47 AM 28602.0* 0 - 125 pg/mL Final    Recent Labs  04/28/12 1605  INR 1.00      Radiology: Dg Chest 2 View 04/23/2012  *RADIOLOGY REPORT*  Clinical Data: Chest pain and hypertension  CHEST - 2 VIEW  Comparison: 03/25/2012  Findings: The heart is again mildly enlarged in size.  Mild interstitial changes are again seen and given some variation and the technical aspects of the film these are stable in appearance. Slight increased density is noted in the left retrocardiac region likely representing an early infiltrate.  No other focal abnormality is seen.  IMPRESSION: Early left basilar infiltrate.   Original Report Authenticated By: Alcide Clever, M.D.    Nm Myocar Multi W/spect W/wall Motion / Ef 04/27/2012  *RADIOLOGY REPORT*  Clinical Data:  Syncope.  Abnormal EKG.  Smoker.  Hypertension. Coronary artery disease.  CHF.  Technique:  Standard myocardial SPECT imaging performed after resting intravenous injection of 10 mCi Tc-3m sestamibi. Subsequently, intravenous infusion of dobutamine performed under the supervision of the Cardiology staff.  At peak effect of the drug, 32.4 mCi Tc-52msestamibi injected intravenously and standard myocardial SPECT imaging performed.  Quantitative gated imaging also performed to evaluate left ventricular wall motion and estimate left ventricular ejection fraction.  Comparison:  Report 08/31/2011  MYOCARDIAL IMAGING WITH SPECT (REST AND PHARMACOLOGIC-STRESS)  Findings:  Fixed defect involves the entire inferior wall and the entire inferior lateral wall.  Abnormal reversibility involves the anterior apex as well as the apical segments of the anterior wall, apical segments of  the septum and anterior lateral wall.  GATED LEFT VENTRICULAR WALL MOTION STUDY  Findings:  Review of the gated images demonstrates marked diminished to wall motion and thickening.  LEFT VENTRICULAR EJECTION FRACTION  Findings:  QGS ejection fraction measures 14% , with an end- diastolic volume of 219 ml and an end-systolic volume of 187 ml.  IMPRESSION:  1.  Large infarct involves the inferior and inferior lateral wall. 2.  Evidence of peri infarct ischemia involving the apical segments of the anterior lateral wall, anterior wall and septum. 3.  Markedly diminished.  Left ventricular systolic function.  The left ventricular ejection fraction equals 14%.  These results will be called to the ordering clinician or representative by the Radiologist Assistant, and communication documented in the PACS Dashboard.   Original Report Authenticated By: Signa Kell, M.D.     Cardiac Cath:  04/29/2012 Left main: 20% plaque.  Left Anterior Descending Artery: Large caliber vessel that courses to the apex. The proximal and mid vessel has mild plaque disease. The distal vessel becomes very small in caliber and has diffuse disease. There appears to be a focal 99% stenosis but this is in the very distal, smaller segment as it approaches the apex. This is too small for PCI. Unchanged from cath in 2010 and 2013.  Circumflex Artery: Moderate sized  vessel with mild plaque in mid vessel.  Right Coronary Artery: Large dominant vessel with patent stents in the mid and distal vessel. There is a 30% stenosis in the proximal vessel just before the stent. The mid stent is patent with mild restenosis. The mid vessel has a focal 40-50% stenosis. The distal stent is patent with mild in stent restenosis. The PL branch and PDA are small to moderate sized, long branches. The PDA becomes very small caliber and has serial 99% stenoses in this very small caliber segment of the vessel. (1.0 mm). The posterolateral branches have mild plaque  disease.  Left Ventricular Angiogram: LVEF=20% with global hypokinesis.  Impression:  1. 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  2. Moderate to severe global LV systolic dysfunction.  Recommendations: Continue medical management of CAD. No focal targets for PCI.   EKG: 25-Apr-2012 06:32:18  Sinus rhythm with 1st degree A-V block with Premature atrial complexes and blocked PAC's ST & T wave abnormality, consider anterolateral ischemia Abnormal ECG 84mm/s 48mm/mV 40Hz  8.0.1 12SL 241 HD CID: 1 Referred by: Confirmed By: Armanda Magic MD Vent. rate 65 BPM PR interval 224 ms QRS duration 90 ms QT/QTc 388/403 ms P-R-T axes 63 35 114  Echo: 04/24/2012 Conclusions - Left ventricle: The cavity size was mildly dilated. Wall thickness was increased in a pattern of mild LVH. Systolic function was mildly to moderately reduced. The estimated ejection fraction was in the range of 40% to 45%. Wall motion was normal; there were no regional wall motion abnormalities. - Left atrium: The atrium was mildly dilated. - Right atrium: The atrium was mildly to moderately dilated. - Pulmonary arteries: Systolic pressure was moderately increased.  FOLLOW UP PLANS AND APPOINTMENTS Allergies  Allergen Reactions  . Amlodipine Other (See Comments)    constipation  . Ramipril Cough  . Adhesive (Tape) Hives    "peels skin off"  . Penicillins Itching  . Codeine Rash     Medication List    STOP taking these medications       carvedilol 25 MG tablet  Commonly known as:  COREG      TAKE these medications       acetaminophen 500 MG tablet  Commonly known as:  TYLENOL  Take 500 mg by mouth every 6 (six) hours as needed for pain.     albuterol (2.5 MG/3ML) 0.083% nebulizer solution  Commonly known as:  PROVENTIL  Take 3 mLs (2.5 mg total) by nebulization every 6 (six) hours as needed for wheezing.     aspirin 81 MG tablet  Take 81 mg by  mouth daily.     atorvastatin 40 MG tablet  Commonly known as:  LIPITOR  Take 1 tablet (40 mg total) by mouth daily.     EFFEXOR XR 150 MG 24 hr capsule  Generic drug:  venlafaxine XR  Take 150 mg by mouth 2 (two) times daily.     feeding supplement Liqd  Take 237 mLs by mouth 2 (two) times daily between meals.     ferrous fumarate 325 (106 FE) MG Tabs  Commonly known as:  FERRETTS  Take 1 tablet (106 mg of iron total) by mouth 2 (two) times daily.     furosemide 80 MG tablet  Commonly known as:  LASIX  Take 1 tablet (80 mg total) by mouth 2 (two) times daily.     Gabapentin (PHN) 300 MG Tabs  Take 1 tablet  by mouth 3 (three) times daily.     hydrALAZINE 50 MG tablet  Commonly known as:  APRESOLINE  Take 1 tablet (50 mg total) by mouth every 8 (eight) hours.     isosorbide mononitrate 60 MG 24 hr tablet  Commonly known as:  IMDUR  Take 1 tablet (60 mg total) by mouth daily.     methocarbamol 500 MG tablet  Commonly known as:  ROBAXIN  Take 1 tablet (500 mg total) by mouth 2 (two) times daily as needed.     MIRALAX PO  Take 17 g by mouth daily. As directed       nitroGLYCERIN 0.4 MG SL tablet  Commonly known as:  NITROSTAT  Place 1 tablet (0.4 mg total) under the tongue every 5 (five) minutes x 3 doses as needed for chest pain.     olmesartan 40 MG tablet  Commonly known as:  BENICAR  Take 1 tablet (40 mg total) by mouth daily.     pantoprazole 40 MG tablet  Commonly known as:  PROTONIX  Take 40 mg by mouth 2 (two) times daily.     potassium chloride 10 MEQ CR capsule  Commonly known as:  MICRO-K  Take 2 capsules (20 mEq total) by mouth 2 (two) times daily.     sitaGLIPtan-metformin 50-500 MG per tablet  Commonly known as:  JANUMET  Take 1 tablet by mouth 2 (two) times daily with a meal. HOLD 4/29 and 4/30. Restart 05/02/2012.     tiotropium 18 MCG inhalation capsule  Commonly known as:  SPIRIVA  Place 1 capsule (18 mcg total) into inhaler and inhale daily.      traZODone 100 MG tablet  Commonly known as:  DESYREL  Take 150 mg by mouth at bedtime. 1 and 1/2 pills at hs        Discharge Orders   Future Appointments Provider Department Dept Phone   05/03/2012 9:45 AM [facility] MEDICAL ONCOLOGY [phone]   05/07/2012 2:45 PM [name], MD [facility] Main Office [place]) [phone]   05/22/2012 10:30 AM Rachael Fee, MD [facility] Healthcare Gastroenterology [phone]   06/12/2012 11:30 AM Su Monks, PA-C [facility] Physical Medicine and Rehabilitation [phone]   06/17/2012 8:45 AM [facility] MEDICAL ONCOLOGY [phone]   06/19/2012 9:00 AM Si Gaul, MD [facility] MEDICAL ONCOLOGY [phone]   08/22/2012 9:00 AM [name], MD [facility] Primary Care -[facility] [phone]   Future Orders Complete By Expires     (HEART FAILURE PATIENTS) Call MD:  Anytime you have any of the following symptoms: 1) 3 pound weight gain in 24 hours or 5 pounds in 1 week 2) shortness of breath, with or without a dry hacking cough 3) swelling in the hands, feet or stomach 4) if you have to sleep on extra pillows at night in order to breathe.  As directed     Diet - low sodium heart healthy  As directed     Diet Carb Modified  As directed     Increase activity slowly  As directed       Follow-up Information   Follow up with Rob Bunting, MD On 05/22/2012. (at 10:30 am)    Contact information:   [address] [place] [address] [phone]       Follow up with [name], MD On 05/07/2012. (at 2:45 pm)    Contact information:   [address]  [address] [place] [address] [phone]       Follow up with [facility] On 05/03/2012. (at 9:45 am)       BRING ALL MEDICATIONS WITH YOU TO FOLLOW UP APPOINTMENTS  Time spent with patient to include physician time: 48 min Signed: Theodore Demark, PA-C 04/30/2012, 1:09 PM Co-Sign MD

## 2012-04-30 NOTE — Care Management Note (Signed)
    Page 1 of 2   04/30/2012     10:24:25 AM   CARE MANAGEMENT NOTE 04/30/2012  Patient:  [name], [name]   Account Number:  [id number]  Date Initiated:  04/23/2012  Documentation initiated by:  Junius Creamer  Subjective/Objective Assessment:   adm w elev bnp     Action/Plan:   lives w husband, pcp dr [name]   Anticipated DC Date:  04/30/2012   Anticipated DC Plan:  HOME W HOME HEALTH SERVICES      DC Planning Services  CM consult      [facility] Choice  HOME HEALTH   Choice offered to / List presented to:  C-1 Patient        HH arranged  HH-1 RN  HH-10 DISEASE MANAGEMENT      HH agency  [facility]   Status of service:  Completed, signed off Medicare Important Message given?   (If response is "NO", the following Medicare IM given date fields will be blank) Date Medicare IM given:   Date Additional Medicare IM given:    Discharge Disposition:    Per UR Regulation:  Reviewed for med. necessity/level of care/duration of stay  If discussed at Long Length of Stay Meetings, dates discussed:   04/30/2012    Comments:  04/30/12 1000.Marland KitchenMarland KitchenOrma Flaming, RN, BSN, [employer] [phone] Spoke with pt (husband at bedside) regarding discharge planning.  Offered pt list of [facility] Agencies to choose from. Pt chose [facility] to render servies.  CM left message with Corrie Dandy of [facility] to set up services.  4/22 1247p debbie dowell rn,bsn

## 2012-05-01 ENCOUNTER — Telehealth: Payer: Self-pay | Admitting: *Deleted

## 2012-05-01 NOTE — Telephone Encounter (Signed)
TCM patient. Left message for call back. 

## 2012-05-02 ENCOUNTER — Other Ambulatory Visit: Payer: Self-pay | Admitting: *Deleted

## 2012-05-02 DIAGNOSIS — I509 Heart failure, unspecified: Secondary | ICD-10-CM

## 2012-05-02 DIAGNOSIS — I5022 Chronic systolic (congestive) heart failure: Secondary | ICD-10-CM

## 2012-05-02 MED ORDER — PANTOPRAZOLE SODIUM 40 MG PO TBEC: 40.0000 mg | DELAYED_RELEASE_TABLET | Freq: Two times a day (BID) | ORAL | Status: DC

## 2012-05-02 NOTE — Telephone Encounter (Signed)
Called patient to let her know that Protonix refill has been ordered.

## 2012-05-02 NOTE — Telephone Encounter (Signed)
TCM management phone call. Patient contacted regarding discharge from hospital on 04/30/12.  Patient understands to follow up with [name] on 05/07/12 at 2:45pm. Also has appt 05/03/12 for iron infusion. Patient understands discharge instructions.  Patient understands medications.  Patient understands to bring all medications to this visit. States she is "a little weak but okay".  Picking up medications today from pharmacy. States she needs Protonix refill. Refill called in to pharmacy 05/02/12.

## 2012-05-03 ENCOUNTER — Ambulatory Visit (HOSPITAL_BASED_OUTPATIENT_CLINIC_OR_DEPARTMENT_OTHER): Payer: Medicare Other

## 2012-05-03 VITALS — BP 132/78 | HR 80 | Temp 98.0°F

## 2012-05-03 DIAGNOSIS — D509 Iron deficiency anemia, unspecified: Secondary | ICD-10-CM

## 2012-05-03 MED ORDER — SODIUM CHLORIDE 0.9 % IV SOLN: Freq: Once | INTRAVENOUS | Status: DC

## 2012-05-03 MED ORDER — FERUMOXYTOL INJECTION 510 MG/17 ML
510.0000 mg | Freq: Once | INTRAVENOUS | Status: AC
  Administered 2012-05-03: 510 mg via INTRAVENOUS
  Filled 2012-05-03: qty 17

## 2012-05-03 NOTE — Patient Instructions (Addendum)
TODAY YOU RECEIVED FERAHEME Ferumoxytol injection What is this medicine? FERUMOXYTOL is an iron complex. Iron is used to make healthy red blood cells, which carry oxygen and nutrients throughout the body. This medicine is used to treat iron deficiency anemia in people with chronic kidney disease. This medicine may be used for other purposes; ask your health care provider or pharmacist if you have questions. What should I tell my health care provider before I take this medicine? They need to know if you have any of these conditions: -anemia not caused by low iron levels -high levels of iron in the blood -magnetic resonance imaging (MRI) test scheduled -an unusual or allergic reaction to iron, other medicines, foods, dyes, or preservatives -pregnant or trying to get pregnant -breast-feeding How should I use this medicine? This medicine is for infusion into a vein. It is given by a health care professional in a hospital or clinic setting. Talk to your pediatrician regarding the use of this medicine in children. Special care may be needed. Overdosage: If you think you've taken too much of this medicine contact a poison control center or emergency room at once. Overdosage: If you think you have taken too much of this medicine contact a poison control center or emergency room at once. NOTE: This medicine is only for you. Do not share this medicine with others. What if I miss a dose? It is important not to miss your dose. Call your doctor or health care professional if you are unable to keep an appointment. What may interact with this medicine? This medicine may interact with the following medications: -other iron products This list may not describe all possible interactions. Give your health care provider a list of all the medicines, herbs, non-prescription drugs, or dietary supplements you use. Also tell them if you smoke, drink alcohol, or use illegal drugs. Some items may interact with your  medicine. What should I watch for while using this medicine? Visit your doctor or healthcare professional regularly. Tell your doctor or healthcare professional if your symptoms do not start to get better or if they get worse. You may need blood work done while you are taking this medicine. You may need to follow a special diet. Talk to your doctor. Foods that contain iron include: whole grains/cereals, dried fruits, beans, or peas, leafy green vegetables, and organ meats (liver, kidney). What side effects may I notice from receiving this medicine? Side effects that you should report to your doctor or health care professional as soon as possible: -allergic reactions like skin rash, itching or hives, swelling of the face, lips, or tongue -breathing problems -changes in blood pressure -feeling faint or lightheaded, falls -fever or chills -flushing, sweating, or hot feelings -swelling of the ankles or feet Side effects that usually do not require medical attention (Report these to your doctor or health care professional if they continue or are bothersome.): -diarrhea -headache -nausea, vomiting -stomach pain This list may not describe all possible side effects. Call your doctor for medical advice about side effects. You may report side effects to FDA at 1-800-FDA-1088. Where should I keep my medicine? This drug is given in a hospital or clinic and will not be stored at home. NOTE: This sheet is a summary. It may not cover all possible information. If you have questions about this medicine, talk to your doctor, pharmacist, or health care provider.  2012, Elsevier/Gold Standard. (09/11/2007 9:48:25 PM) Iron Deficiency Anemia  Anemia is when you have a low number of  healthy red blood cells. HOME CARE   Ask your doctor or dietician what foods you should eat.  Take iron and vitamins as told by your doctor.  Eat foods that have iron in them. This includes liver, lean beef, whole-grain bread,  eggs, dried fruit, and dark green leafy vegetables. GET HELP RIGHT AWAY IF:  You pass out (faint).  You have chest pain.  You feel sick to your stomach (nauseous) or throw up (vomit).  You get very short of breath with activity.  You are weak.  You are thirstier than normal.  You have a fast heartbeat.  You start to sweat or become lightheaded when getting up from a chair or bed. MAKE SURE YOU:  Understand these instructions.  Will watch your condition.  Will get help right away if you are not doing well or get worse. Document Released: 01/21/2010 Document Revised: 03/13/2011 Document Reviewed: 01/21/2010 [facility] Patient Information 2013 [place], [place].

## 2012-05-06 ENCOUNTER — Telehealth: Payer: Self-pay | Admitting: Internal Medicine

## 2012-05-06 NOTE — Telephone Encounter (Signed)
lvm for pt regarding to fri appt.Marland KitchenMarland KitchenMarland KitchenDone

## 2012-05-07 ENCOUNTER — Ambulatory Visit (INDEPENDENT_AMBULATORY_CARE_PROVIDER_SITE_OTHER): Payer: Medicare Other | Admitting: Cardiology

## 2012-05-07 ENCOUNTER — Encounter: Payer: Self-pay | Admitting: Cardiology

## 2012-05-07 VITALS — BP 135/82 | HR 75 | Ht 69.0 in | Wt 146.8 lb

## 2012-05-07 DIAGNOSIS — I5022 Chronic systolic (congestive) heart failure: Secondary | ICD-10-CM

## 2012-05-07 DIAGNOSIS — I2510 Atherosclerotic heart disease of native coronary artery without angina pectoris: Secondary | ICD-10-CM

## 2012-05-07 DIAGNOSIS — I200 Unstable angina: Secondary | ICD-10-CM

## 2012-05-07 DIAGNOSIS — R55 Syncope and collapse: Secondary | ICD-10-CM

## 2012-05-07 NOTE — Progress Notes (Signed)
HPI The patient returns for followup of cardiomyopathy.  She was recently hospitalized after syncope felt to be related to bradycardia from beta blockers. These were discontinued. She did have some chest discomfort ruled out for myocardial infarction. Echocardiography demonstrated EF45% by echo.  She had a cardiac catheterization which demonstrated left main 20% stenosis. The distal LAD was very small and diseased with apical 99% stenosis unchanged. Circumflex had some moderate plaque. There was a mid stent in the right coronary that was patent. The distal stent had some mild restenosis. There was some distal PDA 99% stenosis but this was a very small caliber vessel. She was managed medically.  Of most concern to me was her progressive weight loss. She was set up see GI and that appointment is pending.  She now returns for transition of care followup. She actually says she's done well since she got out of the hospital. She has had no chest discomfort. She thinks her breathing is reasonable. She has had no PND or orthopnea. In particular she's had no palpitations and no presyncope or syncope. She's had no new edema.   Allergies  Allergen Reactions  . Amlodipine Other (See Comments)    constipation  . Ramipril Cough  . Adhesive (Tape) Hives    "peels skin off"  . Penicillins Itching  . Codeine Rash    Current Outpatient Prescriptions  Medication Sig Dispense Refill  . acetaminophen (TYLENOL) 500 MG tablet Take 500 mg by mouth every 6 (six) hours as needed for pain.      [name] albuterol (PROVENTIL) (2.5 MG/3ML) 0.083% nebulizer solution Take 3 mLs (2.5 mg total) by nebulization every 6 (six) hours as needed for wheezing.  75 mL  12  . aspirin 81 MG tablet Take 81 mg by mouth daily.        [name] atorvastatin (LIPITOR) 40 MG tablet Take 1 tablet (40 mg total) by mouth daily.  90 tablet  3  . feeding supplement (GLUCERNA SHAKE) LIQD Take 237 mLs by mouth 2 (two) times daily between meals.  60 Can  1  .  ferrous fumarate (FERRETTS) 325 (106 FE) MG TABS Take 1 tablet (106 mg of iron total) by mouth 2 (two) times daily.  60 tablet  11  . furosemide (LASIX) 80 MG tablet Take 1 tablet (80 mg total) by mouth 2 (two) times daily.  60 tablet  6  . Gabapentin, PHN, 300 MG TABS Take 1 tablet by mouth 3 (three) times daily.  90 tablet  2  . hydrALAZINE (APRESOLINE) 50 MG tablet Take 1 tablet (50 mg total) by mouth every 8 (eight) hours.  90 tablet  6  . isosorbide mononitrate (IMDUR) 60 MG 24 hr tablet Take 1 tablet (60 mg total) by mouth daily.  30 tablet  11  . methocarbamol (ROBAXIN) 500 MG tablet Take 1 tablet (500 mg total) by mouth 2 (two) times daily as needed.  60 tablet  1  . nitroGLYCERIN (NITROSTAT) 0.4 MG SL tablet Place 1 tablet (0.4 mg total) under the tongue every 5 (five) minutes x 3 doses as needed for chest pain.  25 tablet  3  . olmesartan (BENICAR) 40 MG tablet Take 1 tablet (40 mg total) by mouth daily.  30 tablet  1  . pantoprazole (PROTONIX) 40 MG tablet Take 1 tablet (40 mg total) by mouth 2 (two) times daily.  60 tablet  3  . Polyethylene Glycol 3350 (MIRALAX PO) Take 17 g by mouth daily. As directed       .  potassium chloride (MICRO-K) 10 MEQ CR capsule Take 2 capsules (20 mEq total) by mouth 2 (two) times daily.  120 capsule  6  . sitaGLIPtan-metformin (JANUMET) 50-500 MG per tablet Take 1 tablet by mouth 2 (two) times daily with a meal. HOLD 4/29 and 4/30. Restart 05/02/2012.  60 tablet  0  . tiotropium (SPIRIVA) 18 MCG inhalation capsule Place 1 capsule (18 mcg total) into inhaler and inhale daily.  30 capsule  5  . traZODone (DESYREL) 100 MG tablet Take 150 mg by mouth at bedtime. 1 and 1/2 pills at hs      . venlafaxine (EFFEXOR XR) 150 MG 24 hr capsule Take 150 mg by mouth 2 (two) times daily.        . [DISCONTINUED] budesonide-formoterol (SYMBICORT) 80-4.5 MCG/ACT inhaler Inhale 2 puffs into the lungs 2 (two) times daily.  1 Inhaler  12   No current facility-administered  medications for this visit.    Past Medical History  Diagnosis Date  . DM   . DYSLIPIDEMIA   . OBESITY NOS   . DEPRESSION, RECURRENT, IN PARTIAL REMISSION   . HYPERTENSION   . 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 CONGESTIVE HEART FAILURE     EF 40-45% echo 05/2011  . GERD   . DIVERTICULOSIS, COLON, WITH HEMORRHAGE   . BACK PAIN, LUMBAR, WITH RADICULOPATHY   . OBSTRUCTIVE SLEEP APNEA   . Asthma   . Anemia, iron deficiency   . Hyponatremia 12/2011  . Tobacco abuse   . Moderate COPD (chronic obstructive pulmonary disease) 11/07/2005    Spirometry 03/09/11>>FEV1 2.02 (88%), FEV1% 76      Past Surgical History  Procedure Laterality Date  . Uvuloplasty    . Total abdominal hysterectomy w/ bilateral salpingoophorectomy    . Abdominal hysterectomy    . Cholecystectomy    . Coronary angioplasty with stent placement  2002    ROS  As stated in the HPI and negative for all other systems.  PHYSICAL EXAM BP 135/82  Pulse 75  Ht 5\' 9"  (1.753 m)  Wt 146 lb 12.8 oz (66.588 kg)  BMI 21.67 kg/m2 GENERAL:  Well appearing HEENT:  Pupils equal round and reactive, fundi not visualized, oral mucosa unremarkable, dentures NECK:  No jugular venous distention, waveform within normal limits, carotid upstroke brisk and symmetric, no bruits, no thyromegaly LYMPHATICS:  No cervical, inguinal adenopathy LUNGS:  Clear to auscultation bilaterally BACK:  No CVA tenderness CHEST:  Unremarkable HEART:  PMI not displaced or sustained,S1 and S2 within normal limits, no S3, no S4, no clicks, no rubs, no murmurs ABD:  Flat, positive bowel sounds normal in frequency in pitch, no bruits, no rebound, no guarding, no midline pulsatile mass, no hepatomegaly, no splenomegaly, obese EXT:  2 plus pulses throughout, no edema, no cyanosis no clubbing, 10 by 12 mobile soft  nonpulsatile mass medial aspect of the antecubital fossa  SKIN:  No rashes no nodules  EKG:   Sinus rhythm, rate 78, axis within normal limits, QTC prolonged, first degree AV block, inferolateral T wave inversions unchanged from previous.05/07/2012   ASSESSMENT AND PLAN  CORONARY ARTERY DISEASE -  She has disease as described. We will continue to manage this medically.  HYPERTENSION -  Her blood pressure seems to be controlled off of beta blockers. I will not titrate  her meds further.  CONGESTIVE HEART FAILURE -  She seems to be euvolemic at this time. No change in therapy is indicated.  BRADYCARDIA - She will remain on beta blockers. I don't think there is any indication for event monitor as she is having no further symptoms.  WEIGHT LOSS - She is going to see the gastroenterologists for further evaluation of difficulty at times swallowing, abdominal pain and an unexplained weight loss.  CARDIOMYOPATHY - Ultimately the echocardiogram suggested mild left ventricular dysfunction. I suspect is probably moderate. However, no further workup is suggested and she will continue with meds as listed.

## 2012-05-07 NOTE — Patient Instructions (Addendum)
The current medical regimen is effective;  continue present plan and medications.  Follow up with Dr Hochrein in 4 months. 

## 2012-05-09 ENCOUNTER — Encounter: Payer: Self-pay | Admitting: Neurology

## 2012-05-09 ENCOUNTER — Ambulatory Visit (INDEPENDENT_AMBULATORY_CARE_PROVIDER_SITE_OTHER): Payer: Medicare Other | Admitting: Neurology

## 2012-05-09 VITALS — BP 130/74 | HR 67 | Temp 98.3°F | Ht 69.0 in | Wt 149.0 lb

## 2012-05-09 DIAGNOSIS — R413 Other amnesia: Secondary | ICD-10-CM

## 2012-05-09 DIAGNOSIS — F40240 Claustrophobia: Secondary | ICD-10-CM

## 2012-05-09 DIAGNOSIS — F40298 Other specified phobia: Secondary | ICD-10-CM

## 2012-05-09 MED ORDER — ALPRAZOLAM 0.25 MG PO TABS: 0.2500 mg | ORAL_TABLET | ORAL | Status: DC | PRN

## 2012-05-09 NOTE — Patient Instructions (Addendum)
I do want to suggest a few things today:  Remember to drink plenty of fluid within your restrictions, eat healthy meals and do not skip any meals. Try to eat protein with a every meal and eat a healthy snack such as fruit or nuts in between meals. Try to keep a regular sleep-wake schedule and try to exercise daily, particularly in the form of walking, 20-30 minutes a day, if you can.   Engage in social activities in your community and with your family and try to keep up with current events by reading the newspaper or watching the news.   As far as your medications are concerned, I would like to suggest no changes today.   As far as diagnostic testing: we will do a brain MRI. You can take some Xanax before the scan.  I would like to see you back in 3 months, sooner if we need to. Please call us with any interim questions, concerns, problems, updates or refill requests.  Please also call us for any test results so we can go over those with you on the phone. Brett Canales is my clinical assistant and will answer any of your questions and relay your messages to me and also relay most of my messages to you.  Our phone number is [phone]. We also have an after hours call service for urgent matters and there is a physician on-call for urgent questions. For any emergencies you know to call 911 or go to the nearest emergency room.

## 2012-05-09 NOTE — Progress Notes (Signed)
Subjective:    Patient ID: OMOLARA CAROL is a 67 y.o. female.  HPI   Huston Foley, MD, PhD [facility] [address] [address] [place], [place] [address]  Dear Dr. Yetta Barre,  I saw your patient, Baleria Wyman, upon your kind request, in my neurologic clinic today for initial consultation of her memory loss. The patient is accompanied by her husband today. As you know, Ms. Glastetter is a very pleasant 67 year old right-handed woman with an underlying medical history of diabetes, Heart disease, iron deficiency anemia, and depression, who has been having problems with her memory for the past 5 or so years. She also reports numbness and tingling in her feet and fingertips for a long time. She was recently discharged from [facility] for syncopal spells. She was in the hospital from 4/22 through 4/29 and had LHC, echo, coronary angio and Dubutamine Myoview done. She was found to be in 2-1 heart block. She was recommended for continous observation with a 30 day monitor and possibly a loop recorder. She Has had problems with short-term memory. She has no history of confusion or paranoia. She has not been driving. She was placed on oxygen since her hospitalization but says that she only uses it as needed. She didn't bring her oxygen today but says it is in the car in case she needs it. She denies any one-sided weakness, numbness, slurring of speech, facial droop, swallowing difficulty, hearing loss, tinnitus. She has a history of obstructive sleep apnea and uses a CPAP machine. She had sleep apnea surgery in the past. She has had a significant amount of weight loss in the last 5 or 6 months. She does not report much in the way of change in her eating habits or appetite.  Her Past Medical History Is Significant For: Past Medical History  Diagnosis Date  . DM   . DYSLIPIDEMIA   . OBESITY NOS   . DEPRESSION, RECURRENT, IN PARTIAL REMISSION   . HYPERTENSION   . CORONARY  ARTERY DISEASE     S/p Q wave MI 2002 with RCA stent and AngioJet   Cath 4/30 99% distal LAD stenosis unchanged from previous, 20% left main stenosis, patent stents  . CONGESTIVE HEART FAILURE     EF 40-45% echo 05/2011  . GERD   . DIVERTICULOSIS, COLON, WITH HEMORRHAGE   . BACK PAIN, LUMBAR, WITH RADICULOPATHY   . OBSTRUCTIVE SLEEP APNEA   . Asthma   . Anemia, iron deficiency   . Hyponatremia 12/2011  . Tobacco abuse   . Moderate COPD (chronic obstructive pulmonary disease) 11/07/2005    Spirometry 03/09/11>>FEV1 2.02 (88%), FEV1% 76      Her Past Surgical History Is Significant For: Past Surgical History  Procedure Laterality Date  . Uvuloplasty    . Total abdominal hysterectomy w/ bilateral salpingoophorectomy    . Abdominal hysterectomy    . Cholecystectomy    . Coronary angioplasty with stent placement  2002    Her Family History Is Significant For: Family History  Problem Relation Age of Onset  . Stroke Mother   . Breast cancer Sister   . Emphysema Sister   . Cancer Sister     breast  . Alcohol abuse Brother   . Colon cancer      uncles x 2  . Prostate cancer Brother     Her Social History Is Significant For: History   Social History  . Marital Status: Married    Spouse Name: N/A  Number of Children: 1  . Years of Education: 12   Occupational History  . Retired Insurance account manager for [employer]    Social History Main Topics  . Smoking status: Current Some Day Smoker -- 1.00 packs/day for 20 years    Types: Cigarettes  . Smokeless tobacco: Never Used     Comment: Still smokes a little.  . Alcohol Use: No     Comment: Patient drinks cafinated daily.  . Drug Use: No  . Sexually Active: Not Currently    Birth Control/ Protection: Surgical   Other Topics Concern  . Not on file   Social History Narrative  . No narrative on file    Her Allergies Are:  Allergies  Allergen Reactions  . Amlodipine Other (See Comments)    constipation  .  Ramipril Cough  . Adhesive (Tape) Hives    "peels skin off"  . Penicillins Itching  . Codeine Rash  :   Her Current Medications Are:  Outpatient Encounter Prescriptions as of 05/09/2012  Medication Sig Dispense Refill  . acetaminophen (TYLENOL) 500 MG tablet Take 500 mg by mouth every 6 (six) hours as needed for pain.      [name] albuterol (PROVENTIL) (2.5 MG/3ML) 0.083% nebulizer solution Take 3 mLs (2.5 mg total) by nebulization every 6 (six) hours as needed for wheezing.  75 mL  12  . aspirin 81 MG tablet Take 81 mg by mouth daily.        [name] atorvastatin (LIPITOR) 40 MG tablet Take 1 tablet (40 mg total) by mouth daily.  90 tablet  3  . feeding supplement (GLUCERNA SHAKE) LIQD Take 237 mLs by mouth 2 (two) times daily between meals.  60 Can  1  . ferrous fumarate (FERRETTS) 325 (106 FE) MG TABS Take 1 tablet (106 mg of iron total) by mouth 2 (two) times daily.  60 tablet  11  . furosemide (LASIX) 80 MG tablet Take 1 tablet (80 mg total) by mouth 2 (two) times daily.  60 tablet  6  . Gabapentin, PHN, 300 MG TABS Take 1 tablet by mouth 3 (three) times daily.  90 tablet  2  . hydrALAZINE (APRESOLINE) 50 MG tablet Take 1 tablet (50 mg total) by mouth every 8 (eight) hours.  90 tablet  6  . isosorbide mononitrate (IMDUR) 60 MG 24 hr tablet Take 1 tablet (60 mg total) by mouth daily.  30 tablet  11  . methocarbamol (ROBAXIN) 500 MG tablet Take 1 tablet (500 mg total) by mouth 2 (two) times daily as needed.  60 tablet  1  . nitroGLYCERIN (NITROSTAT) 0.4 MG SL tablet Place 1 tablet (0.4 mg total) under the tongue every 5 (five) minutes x 3 doses as needed for chest pain.  25 tablet  3  . olmesartan (BENICAR) 40 MG tablet Take 1 tablet (40 mg total) by mouth daily.  30 tablet  1  . pantoprazole (PROTONIX) 40 MG tablet Take 1 tablet (40 mg total) by mouth 2 (two) times daily.  60 tablet  3  . Polyethylene Glycol 3350 (MIRALAX PO) Take 17 g by mouth daily. As directed       . potassium chloride (MICRO-K)  10 MEQ CR capsule Take 2 capsules (20 mEq total) by mouth 2 (two) times daily.  120 capsule  6  . sitaGLIPtan-metformin (JANUMET) 50-500 MG per tablet Take 1 tablet by mouth 2 (two) times daily with a meal. HOLD 4/29 and 4/30. Restart 05/02/2012.  60 tablet  0  . tiotropium (SPIRIVA) 18 MCG inhalation capsule Place 1 capsule (18 mcg total) into inhaler and inhale daily.  30 capsule  5  . traZODone (DESYREL) 100 MG tablet Take 150 mg by mouth at bedtime. 1 and 1/2 pills at hs      . venlafaxine (EFFEXOR XR) 150 MG 24 hr capsule Take 150 mg by mouth 2 (two) times daily.         No facility-administered encounter medications on file as of 05/09/2012.  :  Review of Systems  Constitutional: Positive for appetite change and fatigue.       Weight loss  HENT:       Restless legs  Cardiovascular: Positive for chest pain and leg swelling.  Gastrointestinal: Positive for constipation.  Endocrine: Positive for cold intolerance.  Musculoskeletal:       Joint pain  Neurological: Positive for numbness and headaches.  Hematological:       Anemia  Psychiatric/Behavioral:       Depression,Anxiety,Decreased Energy    Objective:  Neurologic Exam  Physical Exam Physical Examination:   Filed Vitals:   05/09/12 0853  BP: 130/74  Pulse: 67  Temp: 98.3 F (36.8 C)    General Examination: The patient is a very pleasant 67 y.o. female in no acute distress. She is calm and cooperative with the exam. She denies Auditory Hallucinations and Visual Hallucinations.   HEENT: Normocephalic, atraumatic, pupils are equal, round and reactive to light and accommodation. Funduscopic exam shows sharp disc margins. Extraocular tracking shows mild saccadic breakdown without nystagmus noted. Hearing is intact. Tympanic membranes are clear bilaterally. Face is symmetric with no  facial masking and normal facial sensation. There is no lip, neck or jaw tremor. Neck is Not rigid with intact passive ROM. There are no carotid  bruits on auscultation. Oropharynx exam reveals moderate mouth dryness. No significant airway crowding is noted, Uvula and tonsils are absent. Mallampati is class II. Tongue protrudes centrally and palate elevates symmetrically.    Chest: is clear to auscultation without wheezing, rhonchi or crackles noted.  Heart: sounds are regular and normal without murmurs, rubs or gallops noted.   Abdomen: is soft, non-tender and non-distended with normal bowel sounds appreciated on auscultation.  Extremities: There is no pitting edema in the distal lower extremities bilaterally. Pedal pulses are intact.  Skin: is warm and dry with no trophic changes noted.  Musculoskeletal: exam reveals no obvious joint deformities, tenderness or joint swelling or erythema.  Neurologically:  Mental status: The patient is awake and alert, paying good  attention. She is able to provide the history. Her husband  provides details. She is oriented to: person, place, time/date, situation, day of week, month of year and year. Her memory, attention, language and knowledge are impaired mildly. There is no aphasia, agnosia, apraxia or anomia. There is no bradyphrenia. Speech is mildly hypophonic with no  dysarthria noted. Mood is congruent and affect is normal.   Her MMSE score is 25/30. CDT is 4/4. AFT (Animal Fluency Test) score is 7.   Cranial nerves are as described above under HEENT exam. In addition, shoulder shrug is normal with equal shoulder height noted.  Motor exam: Normal bulk, and strength for age is noted. Tone is not rigid with absence of cogwheeling in the extremities. There is overall mild bradykinesia. There is no drift or rebound. There is no tremor. Romberg is negative. Reflexes are 1+ in the upper extremities and 1+ in the lower extremities, in the knees and  absent in the ankles. Toes are downgoing bilaterally. Fine motor skills: Finger taps, hand movements, and rapid alternating patting are mildly impaired  bilaterally. Foot taps and foot agility are moderately impaired bilaterally.   Cerebellar testing shows no dysmetria or intention tremor on finger to nose testing. Heel to shin is unremarkable. There is no truncal or gait ataxia.   Sensory exam is intact to light touch, pinprick, vibration, temperature sense and proprioception in the upper extremities, with decrease in pinprick, temperature and vibration sense in the distal lower extremities bilaterally.   Gait, station and balance: She stands up from the seated position with mild difficulty and needs mild assistance. No veering to one side is noted. No leaning to one side. Posture is mildly stooped, age appropriate. Stance is wide-based. She turns in 3 steps. Tandem walk is not possible. Balance is mildly impaired.     Assessment and Plan:   Assessment and Plan:  In summary, Atlantis P Nath is a very pleasant 66 y.o.-year old female with a complex medical history of multiple vascular risk factors who has been experiencing memory loss for the past few years, perhaps as long as 5 years. Her history and exam are concerning for early dementia but her MMSE is 25/30, indicating memory loss in the realm of mild cognitive impairment at this time. 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 I talked to her and her husband at length about my findings and her memory loss and would like to proceed with the MRI brain. She indicates that she is quite claustrophobic and I will give her some Xanax to help with a scan. I will not add any blood work today as she has recently had extensive blood work including thyroid function. Down the road we may proceed with formal neurocognitive testing. At this juncture I did not add any new medication and advised her that I would like to reevaluate her. We will call her with her MRI results and I would like to see her back in 3 months from now, sooner if the need arises. The are encouraged  to call with any interim questions, concerns, problems or updates.   Thank you very much for allowing me to participate in the care of this nice patient. If I can be of any further assistance to you please do not hesitate to call me at [phone].  Sincerely,   Huston Foley, MD, PhD

## 2012-05-10 ENCOUNTER — Ambulatory Visit (HOSPITAL_BASED_OUTPATIENT_CLINIC_OR_DEPARTMENT_OTHER): Payer: Medicare Other

## 2012-05-10 ENCOUNTER — Other Ambulatory Visit: Payer: Self-pay | Admitting: *Deleted

## 2012-05-10 VITALS — BP 145/73 | HR 80 | Temp 98.3°F

## 2012-05-10 DIAGNOSIS — D509 Iron deficiency anemia, unspecified: Secondary | ICD-10-CM

## 2012-05-10 MED ORDER — SODIUM CHLORIDE 0.9 % IV SOLN
Freq: Once | INTRAVENOUS | Status: AC
  Administered 2012-05-10: 13:00:00 via INTRAVENOUS

## 2012-05-10 MED ORDER — FERUMOXYTOL INJECTION 510 MG/17 ML
510.0000 mg | Freq: Once | INTRAVENOUS | Status: AC
  Administered 2012-05-10: 510 mg via INTRAVENOUS
  Filled 2012-05-10: qty 17

## 2012-05-10 MED ORDER — SODIUM CHLORIDE 0.9 % IJ SOLN
10.0000 mL | INTRAMUSCULAR | Status: DC | PRN
  Filled 2012-05-10: qty 10

## 2012-05-10 NOTE — Patient Instructions (Addendum)
Ferumoxytol injection What is this medicine? FERUMOXYTOL is an iron complex. Iron is used to make healthy red blood cells, which carry oxygen and nutrients throughout the body. This medicine is used to treat iron deficiency anemia in people with chronic kidney disease. This medicine may be used for other purposes; ask your health care provider or pharmacist if you have questions. What should I tell my health care provider before I take this medicine? They need to know if you have any of these conditions: -anemia not caused by low iron levels -high levels of iron in the blood -magnetic resonance imaging (MRI) test scheduled -an unusual or allergic reaction to iron, other medicines, foods, dyes, or preservatives -pregnant or trying to get pregnant -breast-feeding How should I use this medicine? This medicine is for infusion into a vein. It is given by a health care professional in a hospital or clinic setting. Talk to your pediatrician regarding the use of this medicine in children. Special care may be needed. Overdosage: If you think you've taken too much of this medicine contact a poison control center or emergency room at once. Overdosage: If you think you have taken too much of this medicine contact a poison control center or emergency room at once. NOTE: This medicine is only for you. Do not share this medicine with others. What if I miss a dose? It is important not to miss your dose. Call your doctor or health care professional if you are unable to keep an appointment. What may interact with this medicine? This medicine may interact with the following medications: -other iron products This list may not describe all possible interactions. Give your health care provider a list of all the medicines, herbs, non-prescription drugs, or dietary supplements you use. Also tell them if you smoke, drink alcohol, or use illegal drugs. Some items may interact with your medicine. What should I watch  for while using this medicine? Visit your doctor or healthcare professional regularly. Tell your doctor or healthcare professional if your symptoms do not start to get better or if they get worse. You may need blood work done while you are taking this medicine. You may need to follow a special diet. Talk to your doctor. Foods that contain iron include: whole grains/cereals, dried fruits, beans, or peas, leafy green vegetables, and organ meats (liver, kidney). What side effects may I notice from receiving this medicine? Side effects that you should report to your doctor or health care professional as soon as possible: -allergic reactions like skin rash, itching or hives, swelling of the face, lips, or tongue -breathing problems -changes in blood pressure -feeling faint or lightheaded, falls -fever or chills -flushing, sweating, or hot feelings -swelling of the ankles or feet Side effects that usually do not require medical attention (Report these to your doctor or health care professional if they continue or are bothersome.): -diarrhea -headache -nausea, vomiting -stomach pain This list may not describe all possible side effects. Call your doctor for medical advice about side effects. You may report side effects to FDA at 1-800-FDA-1088. Where should I keep my medicine? This drug is given in a hospital or clinic and will not be stored at home. NOTE: This sheet is a summary. It may not cover all possible information. If you have questions about this medicine, talk to your doctor, pharmacist, or health care provider.  2012, Elsevier/Gold Standard. (09/11/2007 9:48:25 PM) 

## 2012-05-20 ENCOUNTER — Ambulatory Visit
Admission: RE | Admit: 2012-05-20 | Discharge: 2012-05-20 | Disposition: A | Discharge status: Home / Self Care | Payer: Medicare Other | Source: Ambulatory Visit | Attending: Neurology | Admitting: Neurology

## 2012-05-20 DIAGNOSIS — R413 Other amnesia: Secondary | ICD-10-CM

## 2012-05-22 ENCOUNTER — Ambulatory Visit (INDEPENDENT_AMBULATORY_CARE_PROVIDER_SITE_OTHER): Payer: Medicare Other | Admitting: Gastroenterology

## 2012-05-22 ENCOUNTER — Other Ambulatory Visit (INDEPENDENT_AMBULATORY_CARE_PROVIDER_SITE_OTHER): Payer: Medicare Other

## 2012-05-22 ENCOUNTER — Encounter: Payer: Self-pay | Admitting: Gastroenterology

## 2012-05-22 VITALS — BP 112/68 | HR 70 | Ht 66.0 in | Wt 146.0 lb

## 2012-05-22 DIAGNOSIS — R109 Unspecified abdominal pain: Secondary | ICD-10-CM

## 2012-05-22 DIAGNOSIS — R634 Abnormal weight loss: Secondary | ICD-10-CM

## 2012-05-22 LAB — CBC WITH DIFFERENTIAL/PLATELET
Basophils Absolute: 0 10*3/uL (ref 0.0–0.1)
Eosinophils Absolute: 0.2 10*3/uL (ref 0.0–0.7)
HCT: 33.2 % — ABNORMAL LOW (ref 36.0–46.0)
Hemoglobin: 11.1 g/dL — ABNORMAL LOW (ref 12.0–15.0)
Lymphs Abs: 1 10*3/uL (ref 0.7–4.0)
MCHC: 33.3 g/dL (ref 30.0–36.0)
MCV: 93.2 fl (ref 78.0–100.0)
Neutro Abs: 3.1 10*3/uL (ref 1.4–7.7)
RDW: 18.9 % — ABNORMAL HIGH (ref 11.5–14.6)

## 2012-05-22 NOTE — Patient Instructions (Addendum)
One of your biggest health concerns is your smoking.  This increases your risk for most cancers and serious cardiovascular diseases such as strokes, heart attacks.  You should try your best to stop.  If you need assistance, please contact your PCP or Smoking Cessation Class at [facility] [phone]) or [facility] Quit-Line (1-800-QUIT-NOW). You will have labs checked today in the basement lab.  Please head down after you check out with the front desk  (cbc, cmet). You will be set up for a CT scan of abdomen and pelvis with IV and oral contrast (for abd pains, weight loss).  You have been scheduled for a CT scan of the abdomen and pelvis at [facility] ([address]---this is in the same building as Architectural techn[facility]).   You are scheduled on 05/23/12 at 930 am. You should arrive 15 minutes prior to your appointment time for registration. Please follow the written instructions below on the day of your exam:  WARNING: IF YOU ARE ALLERGIC TO IODINE/X-RAY DYE, PLEASE NOTIFY RADIOLOGY IMMEDIATELY AT [id number]! YOU WILL BE GIVEN A 13 HOUR PREMEDICATION PREP.  1) Do not eat or drink anything after 530 am (4 hours prior to your test) 2) You have been given 2 bottles of oral contrast to drink. The solution may taste better if refrigerated, but do NOT add ice or any other liquid to this solution. Shake well before drinking.    Drink 1 bottle of contrast @ 730 am (2 hours prior to your exam)  Drink 1 bottle of contrast @ 830 am (1 hour prior to your exam)  You may take any medications as prescribed with a small amount of water except for the following: Metformin, Glucophage, Glucovance, Avandamet, Riomet, Fortamet, Actoplus Met, Janumet, Glumetza or Metaglip. The above medications must be held the day of the exam AND 48 hours after the exam.  The purpose of you drinking the oral contrast is to aid in the visualization of your intestinal tract. The contrast solution may cause some  diarrhea. Before your exam is started, you will be given a small amount of fluid to drink. Depending on your individual set of symptoms, you may also receive an intravenous injection of x-ray contrast/dye. Plan on being at [facility] for 30 minutes or long, depending on the type of exam you are having performed.  This test typically takes 30-45 minutes to complete.  If you have any questions regarding your exam or if you need to reschedule, you may call the CT department at [phone] between the hours of 8:00 am and 5:00 pm, Monday-Friday.  ________________________________________________________________________  Pending the above results, you may need EGD.

## 2012-05-22 NOTE — Progress Notes (Signed)
Review of pertinent gastrointestinal problems:  1. Likely diabetic gastroparesis causing nausea, bloating, some abdominal discomfort. I do not see any previous gastric emptying scans to prove this.  2. History of colon polyps. Last colonoscopy June 2008 by Dr. Sam [place] found diverticulosis, tortuous colon, no polyps. He recommended she have a repeat colonoscopy in 8 years. I have not found polyp pathology in her chart. Probable diverticular bleed February, 2009. Admitted to the hospital with bright red blood per rectum, mild anemia, bleeding resolved without intervention. No need for blood transfusion. Colonoscopy 2013 found no polyps, recommended recall at 5 years 3. Chronic constipation. Constipation imroving with daily Miralax.  HPI: This is a    very pleasant 67-year-old [name] whom I last saw about a year ago. She has had a significant clinical decline since then. She has had issues with her congestive heart failure, issues with her memory. She has lost about 60 pounds according to our scales in the past 10 months or so.  She has iron deficiency anemia. Colonoscopy 2013 found no cancers or polyps.  Her biggest GI related symptom is anorexia, some abdominal pain predominantly in the lower abdomen.  She is here with her husband.  Was in hosp in [place] with CHF exacerbation (december 2013).   She does not eat normally per her husband  Has been on home oxygen.  She has pain in her lower abdomen. Tends to be constipated, uses miralax.  Still needs to strain.  Has lost 60 pounds in past year.  CT scan abd may 2013 showed no signficant pathology.   Review of systems: Pertinent positive and negative review of systems were noted in the above HPI section. Complete review of systems was performed and was otherwise normal.    Past Medical History  Diagnosis Date  . DM   . DYSLIPIDEMIA   . OBESITY NOS   . DEPRESSION, RECURRENT, IN PARTIAL REMISSION   . HYPERTENSION   . CORONARY ARTERY  DISEASE     S/p Q wave MI 2002 with RCA stent and AngioJet   Cath 4/30 99% distal LAD stenosis unchanged from previous, 20% left main stenosis, patent stents  . CONGESTIVE HEART FAILURE     EF 40-45% echo 05/2011  . GERD   . DIVERTICULOSIS, COLON, WITH HEMORRHAGE   . BACK PAIN, LUMBAR, WITH RADICULOPATHY   . OBSTRUCTIVE SLEEP APNEA   . Asthma   . Anemia, iron deficiency   . Hyponatremia 12/2011  . Tobacco abuse   . Moderate COPD (chronic obstructive pulmonary disease) 11/07/2005    Spirometry 03/09/11>>FEV1 2.02 (88%), FEV1% 76      Past Surgical History  Procedure Laterality Date  . Uvuloplasty    . Total abdominal hysterectomy w/ bilateral salpingoophorectomy    . Abdominal hysterectomy    . Cholecystectomy    . Coronary angioplasty with stent placement  2002    Current Outpatient Prescriptions  Medication Sig Dispense Refill  . acetaminophen (TYLENOL) 500 MG tablet Take 500 mg by mouth every 6 (six) hours as needed for pain.      . albuterol (PROVENTIL) (2.5 MG/3ML) 0.083% nebulizer solution Take 3 mLs (2.5 mg total) by nebulization every 6 (six) hours as needed for wheezing.  75 mL  12  . ALPRAZolam (XANAX) 0.25 MG tablet Take 1 tablet (0.25 mg total) by mouth as needed for sleep. Take for MRI, take 15-20 minutes before MRI, may take 1-2 pills, and take a third pill if needed.  3 tablet  0  .   aspirin 81 MG tablet Take 81 mg by mouth daily.        . atorvastatin (LIPITOR) 40 MG tablet Take 1 tablet (40 mg total) by mouth daily.  90 tablet  3  . feeding supplement (GLUCERNA SHAKE) LIQD Take 237 mLs by mouth 2 (two) times daily between meals.  60 Can  1  . ferrous fumarate (FERRETTS) 325 (106 FE) MG TABS Take 1 tablet (106 mg of iron total) by mouth 2 (two) times daily.  60 tablet  11  . furosemide (LASIX) 80 MG tablet Take 1 tablet (80 mg total) by mouth 2 (two) times daily.  60 tablet  6  . Gabapentin, PHN, 300 MG TABS Take 1 tablet by mouth 3 (three) times daily.  90 tablet  2   . hydrALAZINE (APRESOLINE) 50 MG tablet Take 1 tablet (50 mg total) by mouth every 8 (eight) hours.  90 tablet  6  . isosorbide mononitrate (IMDUR) 60 MG 24 hr tablet Take 1 tablet (60 mg total) by mouth daily.  30 tablet  11  . methocarbamol (ROBAXIN) 500 MG tablet Take 1 tablet (500 mg total) by mouth 2 (two) times daily as needed.  60 tablet  1  . nitroGLYCERIN (NITROSTAT) 0.4 MG SL tablet Place 1 tablet (0.4 mg total) under the tongue every 5 (five) minutes x 3 doses as needed for chest pain.  25 tablet  3  . olmesartan (BENICAR) 40 MG tablet Take 1 tablet (40 mg total) by mouth daily.  30 tablet  1  . pantoprazole (PROTONIX) 40 MG tablet Take 1 tablet (40 mg total) by mouth 2 (two) times daily.  60 tablet  3  . Polyethylene Glycol 3350 (MIRALAX PO) Take 17 g by mouth daily. As directed       . potassium chloride (MICRO-K) 10 MEQ CR capsule Take 2 capsules (20 mEq total) by mouth 2 (two) times daily.  120 capsule  6  . sitaGLIPtan-metformin (JANUMET) 50-500 MG per tablet Take 1 tablet by mouth 2 (two) times daily with a meal. HOLD 4/29 and 4/30. Restart 05/02/2012.  60 tablet  0  . tiotropium (SPIRIVA) 18 MCG inhalation capsule Place 1 capsule (18 mcg total) into inhaler and inhale daily.  30 capsule  5  . traZODone (DESYREL) 100 MG tablet Take 150 mg by mouth at bedtime. 1 and 1/2 pills at hs      . venlafaxine (EFFEXOR XR) 150 MG 24 hr capsule Take 150 mg by mouth 2 (two) times daily.        . [DISCONTINUED] budesonide-formoterol (SYMBICORT) 80-4.5 MCG/ACT inhaler Inhale 2 puffs into the lungs 2 (two) times daily.  1 Inhaler  12   No current facility-administered medications for this visit.    Allergies as of 05/22/2012 - Review Complete 05/22/2012  Allergen Reaction Noted  . Amlodipine Other (See Comments) 01/30/2011  . Ramipril Cough 09/26/2010  . Adhesive (tape) Hives 09/26/2010  . Penicillins Itching   . Codeine Rash 09/26/2010    Family History  Problem Relation Age of Onset   . Stroke Mother   . Breast cancer Sister   . Emphysema Sister   . Cancer Sister     breast  . Alcohol abuse Brother   . Colon cancer      uncles x 2  . Prostate cancer Brother     History   Social History  . Marital Status: Married    Spouse Name: N/A    Number of Children: 1  .   Years of Education: 12   Occupational History  . 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 Smoking status: Current Some Day Smoker -- 1.00 packs/day for 20 years    Types: Cigarettes  . Smokeless tobacco: Never Used     Comment: Still smokes a little.  . Alcohol Use: No     Comment: Patient drinks cafinated daily.  . Drug Use: No  . Sexually Active: Not Currently    Birth Control/ Protection: Surgical   Other Topics Concern  . Not on file   Social History Narrative  . No narrative on file       Physical Exam: BP 112/68  Pulse 70  Ht 5' 6" (1.676 m)  Wt 146 lb (66.225 kg)  BMI 23.58 kg/m2 Constitutional:  Chronically ill-appearing, walks with a walker, she appears somewhat cachectic  Psychiatric: alert and oriented x3 Eyes: extraocular movements intact Mouth: oral pharynx moist, no lesions Neck: supple no lymphadenopathy Cardiovascular: heart regular rate and rhythm Lungs: clear to auscultation bilaterally Abdomen: soft, nontender, nondistended, no obvious ascites, no peritoneal signs, normal bowel sounds Extremities: no lower extremity edema bilaterally Skin: no lesions on visible extremities    Assessment and plan: 67 y.o. female with  significant weight loss, mild GI symptoms  I am most concerned by her significant weight loss and wonder about occult cancer. Colonoscopy was in 2013 and it does not need to be repeated right now. Going to have her get a basic set of labs including a CBC, complete metabolic profile. We will also set up CT scan abdomen and pelvis. If this is unrevealing then she'll need an EGD.      

## 2012-05-23 ENCOUNTER — Ambulatory Visit (INDEPENDENT_AMBULATORY_CARE_PROVIDER_SITE_OTHER)
Admission: RE | Admit: 2012-05-23 | Discharge: 2012-05-23 | Disposition: A | Discharge status: Home / Self Care | Payer: Medicare Other | Source: Ambulatory Visit | Attending: Gastroenterology | Admitting: Gastroenterology

## 2012-05-23 DIAGNOSIS — R109 Unspecified abdominal pain: Secondary | ICD-10-CM

## 2012-05-23 DIAGNOSIS — R634 Abnormal weight loss: Secondary | ICD-10-CM

## 2012-05-23 MED ORDER — IOHEXOL 300 MG/ML  SOLN
100.0000 mL | Freq: Once | INTRAMUSCULAR | Status: AC | PRN
  Administered 2012-05-23: 100 mL via INTRAVENOUS

## 2012-05-24 NOTE — Progress Notes (Signed)
Quick Note:  Please call and advise the patient that the recent brain scan we did showed atrophy or volume loss. This may result in memory issues. There were also white matter changes which are chronic and nonspecific and indicate in most instances the presence of chronic illnesses such as hypertension, high cholesterol, diabetes. It is important that she have a good blood pressure and diabetes and good cholesterol control but also quit smoking. I would like to proceed with more formal memory testing if she is okay with that. We had talked about it during our appointment recently. If she would be willing I will make a referral to a neuropsychologist who can spend more time with her and give a more detailed evaluation of her memory issues. This will need a referral and I will place a referral if she gives this to go ahead. Please remind patient to keep any upcoming appointments or tests and to call us with any interim questions, concerns, problems or updates. Thanks,  Huston Foley, MD, PhD   ______

## 2012-05-28 ENCOUNTER — Telehealth: Payer: Self-pay | Admitting: Neurology

## 2012-05-28 DIAGNOSIS — R9089 Other abnormal findings on diagnostic imaging of central nervous system: Secondary | ICD-10-CM

## 2012-05-28 DIAGNOSIS — R413 Other amnesia: Secondary | ICD-10-CM

## 2012-05-28 NOTE — Telephone Encounter (Signed)
Will request cognitive testing.

## 2012-05-28 NOTE — Telephone Encounter (Signed)
Message copied by Huston Foley on Tue May 28, 2012  5:26 PM ------      Message from: Avie Echevaria      Created: Tue May 28, 2012 11:07 AM       Spoke with patient and relayed the results of their MRI as well as Dr Teofilo Pod advise or recommendations.  The patient was also reminded of any future appointments.  The patient understood, had no questions and agrees to have further testing done.  Please make a referral to a neuropsychologist.       ------

## 2012-05-28 NOTE — Progress Notes (Signed)
Quick Note:  Spoke with patient and relayed the results of their MRI as well as Dr Teofilo Pod advise or recommendations. The patient was also reminded of any future appointments. The patient understood, had no questions and agrees to have further testing done. Please make a referral to a neuropsychologist.  ______

## 2012-05-30 DIAGNOSIS — I2510 Atherosclerotic heart disease of native coronary artery without angina pectoris: Secondary | ICD-10-CM

## 2012-05-30 DIAGNOSIS — R55 Syncope and collapse: Secondary | ICD-10-CM

## 2012-05-30 DIAGNOSIS — I509 Heart failure, unspecified: Secondary | ICD-10-CM

## 2012-05-30 DIAGNOSIS — I200 Unstable angina: Secondary | ICD-10-CM

## 2012-05-30 DIAGNOSIS — I5022 Chronic systolic (congestive) heart failure: Secondary | ICD-10-CM

## 2012-05-31 ENCOUNTER — Ambulatory Visit (AMBULATORY_SURGERY_CENTER): Payer: Medicare Other | Admitting: *Deleted

## 2012-05-31 ENCOUNTER — Telehealth: Payer: Self-pay

## 2012-05-31 VITALS — Ht 69.0 in | Wt 150.0 lb

## 2012-05-31 DIAGNOSIS — R634 Abnormal weight loss: Secondary | ICD-10-CM

## 2012-05-31 DIAGNOSIS — R109 Unspecified abdominal pain: Secondary | ICD-10-CM

## 2012-05-31 NOTE — Telephone Encounter (Signed)
EGD [facility] Left message on machine to call back

## 2012-05-31 NOTE — Telephone Encounter (Signed)
Message copied by Donata Duff on Fri May 31, 2012  8:34 AM ------      Message from: Rachael Fee      Created: Fri May 31, 2012  7:32 AM      Regarding: RE: ASA IV pt       Jonny Ruiz,      Thanks for catching this.                  Nakia Koble,      She needs to be scheduled for her procedure at [facility] of [facility].            dj                  ----- Message -----         From: Cathlyn Parsons, CRNA         Sent: 05/31/2012   6:56 AM           To: Rachael Fee, MD, Clarnce Flock, #      Subject: ASA IV pt                                                Doc,            I hope you are well.  This patient appears to have very little cardiac reserve and is on home O2.  Consequently, she does not qualify for care at [facility]..            Thanks for your consideration in this matter.            Kind regards,            Cathlyn Parsons       ------

## 2012-06-03 ENCOUNTER — Telehealth: Payer: Self-pay | Admitting: Cardiology

## 2012-06-03 ENCOUNTER — Other Ambulatory Visit: Payer: Self-pay

## 2012-06-03 ENCOUNTER — Encounter (HOSPITAL_COMMUNITY): Payer: Self-pay | Admitting: *Deleted

## 2012-06-03 ENCOUNTER — Ambulatory Visit: Payer: Medicare Other | Admitting: Cardiology

## 2012-06-03 ENCOUNTER — Encounter (HOSPITAL_COMMUNITY): Payer: Self-pay | Admitting: Pharmacy Technician

## 2012-06-03 DIAGNOSIS — K9189 Other postprocedural complications and disorders of digestive system: Secondary | ICD-10-CM

## 2012-06-03 NOTE — Telephone Encounter (Signed)
New problem   [facility] stated pt medication for Pantoprazole (Protonix) is on back order and wont be in til June. Is there another medication pt can be put on til then. Please call Pharmacy.

## 2012-06-03 NOTE — Telephone Encounter (Signed)
Called and left message of order for Prilosec 20 at pharmacy

## 2012-06-03 NOTE — Telephone Encounter (Signed)
Will forward to MD for review and orders 

## 2012-06-03 NOTE — Telephone Encounter (Signed)
OK to use Prilosec 20 mg daily po.  Disp number 30 with 6 refills.

## 2012-06-03 NOTE — Telephone Encounter (Signed)
Pt has been rescheduled for [facility] 06/06/12 1030 am EGD mac she has been instructed and meds reviewed

## 2012-06-06 ENCOUNTER — Encounter (HOSPITAL_COMMUNITY): Payer: Self-pay

## 2012-06-06 ENCOUNTER — Encounter (HOSPITAL_COMMUNITY)
Admission: RE | Disposition: A | Discharge status: Home / Self Care | Payer: Self-pay | Source: Ambulatory Visit | Attending: Gastroenterology

## 2012-06-06 ENCOUNTER — Ambulatory Visit (HOSPITAL_COMMUNITY)
Admission: RE | Admit: 2012-06-06 | Discharge: 2012-06-06 | Disposition: A | Discharge status: Home / Self Care | Payer: Medicare Other | Source: Ambulatory Visit | Attending: Gastroenterology | Admitting: Gastroenterology

## 2012-06-06 ENCOUNTER — Encounter (HOSPITAL_COMMUNITY): Payer: Self-pay | Admitting: Anesthesiology

## 2012-06-06 ENCOUNTER — Ambulatory Visit (HOSPITAL_COMMUNITY): Payer: Medicare Other | Admitting: Anesthesiology

## 2012-06-06 DIAGNOSIS — K2970 Gastritis, unspecified, without bleeding: Secondary | ICD-10-CM

## 2012-06-06 DIAGNOSIS — I2510 Atherosclerotic heart disease of native coronary artery without angina pectoris: Secondary | ICD-10-CM | POA: Insufficient documentation

## 2012-06-06 DIAGNOSIS — K219 Gastro-esophageal reflux disease without esophagitis: Secondary | ICD-10-CM

## 2012-06-06 DIAGNOSIS — Z7982 Long term (current) use of aspirin: Secondary | ICD-10-CM | POA: Insufficient documentation

## 2012-06-06 DIAGNOSIS — E119 Type 2 diabetes mellitus without complications: Secondary | ICD-10-CM | POA: Insufficient documentation

## 2012-06-06 DIAGNOSIS — Z860100 Personal history of colon polyps, unspecified: Secondary | ICD-10-CM | POA: Insufficient documentation

## 2012-06-06 DIAGNOSIS — F17200 Nicotine dependence, unspecified, uncomplicated: Secondary | ICD-10-CM | POA: Insufficient documentation

## 2012-06-06 DIAGNOSIS — R630 Anorexia: Secondary | ICD-10-CM | POA: Insufficient documentation

## 2012-06-06 DIAGNOSIS — R634 Abnormal weight loss: Secondary | ICD-10-CM | POA: Insufficient documentation

## 2012-06-06 DIAGNOSIS — R109 Unspecified abdominal pain: Secondary | ICD-10-CM | POA: Insufficient documentation

## 2012-06-06 DIAGNOSIS — I10 Essential (primary) hypertension: Secondary | ICD-10-CM | POA: Insufficient documentation

## 2012-06-06 DIAGNOSIS — K319 Disease of stomach and duodenum, unspecified: Secondary | ICD-10-CM | POA: Insufficient documentation

## 2012-06-06 DIAGNOSIS — K2940 Chronic atrophic gastritis without bleeding: Secondary | ICD-10-CM | POA: Insufficient documentation

## 2012-06-06 DIAGNOSIS — E785 Hyperlipidemia, unspecified: Secondary | ICD-10-CM | POA: Insufficient documentation

## 2012-06-06 DIAGNOSIS — D509 Iron deficiency anemia, unspecified: Secondary | ICD-10-CM

## 2012-06-06 DIAGNOSIS — G4733 Obstructive sleep apnea (adult) (pediatric): Secondary | ICD-10-CM | POA: Insufficient documentation

## 2012-06-06 DIAGNOSIS — K3189 Other diseases of stomach and duodenum: Secondary | ICD-10-CM | POA: Insufficient documentation

## 2012-06-06 DIAGNOSIS — K9189 Other postprocedural complications and disorders of digestive system: Secondary | ICD-10-CM

## 2012-06-06 DIAGNOSIS — E669 Obesity, unspecified: Secondary | ICD-10-CM | POA: Insufficient documentation

## 2012-06-06 DIAGNOSIS — J4489 Other specified chronic obstructive pulmonary disease: Secondary | ICD-10-CM | POA: Insufficient documentation

## 2012-06-06 DIAGNOSIS — J449 Chronic obstructive pulmonary disease, unspecified: Secondary | ICD-10-CM | POA: Insufficient documentation

## 2012-06-06 DIAGNOSIS — Z79899 Other long term (current) drug therapy: Secondary | ICD-10-CM | POA: Insufficient documentation

## 2012-06-06 DIAGNOSIS — K5900 Constipation, unspecified: Secondary | ICD-10-CM | POA: Insufficient documentation

## 2012-06-06 DIAGNOSIS — R1013 Epigastric pain: Secondary | ICD-10-CM | POA: Insufficient documentation

## 2012-06-06 DIAGNOSIS — K2990 Gastroduodenitis, unspecified, without bleeding: Secondary | ICD-10-CM

## 2012-06-06 DIAGNOSIS — I509 Heart failure, unspecified: Secondary | ICD-10-CM | POA: Insufficient documentation

## 2012-06-06 HISTORY — PX: ESOPHAGOGASTRODUODENOSCOPY (EGD) WITH PROPOFOL: SHX5813

## 2012-06-06 LAB — BASIC METABOLIC PANEL
BUN: 9 mg/dL (ref 6–23)
Calcium: 9.3 mg/dL (ref 8.4–10.5)
Creatinine, Ser: 0.91 mg/dL (ref 0.50–1.10)
GFR calc Af Amer: 73 mL/min — ABNORMAL LOW (ref >90)
GFR calc non Af Amer: 63 mL/min — ABNORMAL LOW (ref >90)
Potassium: 3.2 mEq/L — ABNORMAL LOW (ref 3.5–5.1)

## 2012-06-06 SURGERY — ESOPHAGOGASTRODUODENOSCOPY (EGD) WITH PROPOFOL
Anesthesia: Monitor Anesthesia Care

## 2012-06-06 MED ORDER — MIDAZOLAM HCL 5 MG/5ML IJ SOLN
INTRAMUSCULAR | Status: DC | PRN
  Administered 2012-06-06: 1 mg via INTRAVENOUS

## 2012-06-06 MED ORDER — LACTATED RINGERS IV SOLN
INTRAVENOUS | Status: DC
  Administered 2012-06-06: 1000 mL via INTRAVENOUS
  Administered 2012-06-06: 11:00:00 via INTRAVENOUS

## 2012-06-06 MED ORDER — KETAMINE HCL 10 MG/ML IJ SOLN
INTRAMUSCULAR | Status: DC | PRN
  Administered 2012-06-06 (×2): 10 mg via INTRAVENOUS

## 2012-06-06 MED ORDER — PROPOFOL INFUSION 10 MG/ML OPTIME
INTRAVENOUS | Status: DC | PRN
  Administered 2012-06-06: 160 ug/kg/min via INTRAVENOUS

## 2012-06-06 MED ORDER — BUTAMBEN-TETRACAINE-BENZOCAINE 2-2-14 % EX AERO
INHALATION_SPRAY | CUTANEOUS | Status: DC | PRN
  Administered 2012-06-06: 1 via TOPICAL

## 2012-06-06 MED ORDER — SODIUM CHLORIDE 0.9 % IV SOLN: INTRAVENOUS | Status: DC

## 2012-06-06 SURGICAL SUPPLY — 14 items

## 2012-06-06 NOTE — Anesthesia Preprocedure Evaluation (Signed)
Anesthesia Evaluation  Patient identified by MRN, date of birth, ID band Patient awake    Reviewed: Allergy & Precautions, H&P , NPO status , Patient's Chart, lab work & pertinent test results  Airway Mallampati: II TM Distance: >3 FB Neck ROM: full    Dental no notable dental hx. (+) Edentulous Upper and Edentulous Lower   Pulmonary asthma , sleep apnea , COPDCurrent Smoker,  breath sounds clear to auscultation  Pulmonary exam normal       Cardiovascular Exercise Tolerance: Poor hypertension, + angina + CAD, + Past MI and +CHF Rhythm:regular Rate:Normal  MI 2002 with RCA stent.  99% distal LAD stenosis on cath. EF  40 - 50% echo 5/13   Neuro/Psych negative neurological ROS  negative psych ROS   GI/Hepatic negative GI ROS, Neg liver ROS, GERD-  Medicated and Controlled,  Endo/Other  diabetes, Well Controlled, Type 2, Oral Hypoglycemic Agents  Renal/GU negative Renal ROS  negative genitourinary   Musculoskeletal   Abdominal   Peds  Hematology negative hematology ROS (+) anemia ,   Anesthesia Other Findings   Reproductive/Obstetrics negative OB ROS                           Anesthesia Physical Anesthesia Plan  ASA: IV  Anesthesia Plan: MAC   Post-op Pain Management:    Induction:   Airway Management Planned: Simple Face Mask  Additional Equipment:   Intra-op Plan:   Post-operative Plan:   Informed Consent: I have reviewed the patients History and Physical, chart, labs and discussed the procedure including the risks, benefits and alternatives for the proposed anesthesia with the patient or authorized representative who has indicated his/her understanding and acceptance.   Dental Advisory Given  Plan Discussed with: CRNA and Surgeon  Anesthesia Plan Comments:         Anesthesia Quick Evaluation

## 2012-06-06 NOTE — Transfer of Care (Signed)
Immediate Anesthesia Transfer of Care Note  Patient: [name]  Procedure(s) Performed: Procedure(s): ESOPHAGOGASTRODUODENOSCOPY (EGD) WITH PROPOFOL (N/A)  Patient Location: PACU and Endoscopy Unit  Anesthesia Type:MAC  Level of Consciousness: awake and sedated  Airway & Oxygen Therapy: Patient Spontanous Breathing and Patient connected to nasal cannula oxygen  Post-op Assessment: Report given to PACU RN and Post -op Vital signs reviewed and stable  Post vital signs: Reviewed and stable  Complications: No apparent anesthesia complications

## 2012-06-06 NOTE — Anesthesia Postprocedure Evaluation (Signed)
  Anesthesia Post-op Note  Patient: [name]  Procedure(s) Performed: Procedure(s) (LRB): ESOPHAGOGASTRODUODENOSCOPY (EGD) WITH PROPOFOL (N/A)  Patient Location: PACU  Anesthesia Type: MAC  Level of Consciousness: awake and alert   Airway and Oxygen Therapy: Patient Spontanous Breathing  Post-op Pain: mild  Post-op Assessment: Post-op Vital signs reviewed, Patient's Cardiovascular Status Stable, Respiratory Function Stable, Patent Airway and No signs of Nausea or vomiting  Last Vitals:  Filed Vitals:   06/06/12 1155  BP: 179/113  Pulse: 85  Temp:   Resp: 13    Post-op Vital Signs: stable   Complications: No apparent anesthesia complications

## 2012-06-06 NOTE — H&P (View-Only) (Signed)
Review of pertinent gastrointestinal problems:  1. Likely diabetic gastroparesis causing nausea, bloating, some abdominal discomfort. I do not see any previous gastric emptying scans to prove this.  2. History of colon polyps. Last colonoscopy June 2008 by Dr. Victorino Dike found diverticulosis, tortuous colon, no polyps. He recommended she have a repeat colonoscopy in 8 years. I have not found polyp pathology in her chart. Probable diverticular bleed February, 2009. Admitted to the hospital with bright red blood per rectum, mild anemia, bleeding resolved without intervention. No need for blood transfusion. Colonoscopy 2013 found no polyps, recommended recall at 5 years 3. Chronic constipation. Constipation imroving with daily Miralax.  HPI: This is a    very pleasant 67 year old woman whom I last saw about a year ago. She has had a significant clinical decline since then. She has had issues with her congestive heart failure, issues with her memory. She has lost about 60 pounds according to our scales in the past 10 months or so.  She has iron deficiency anemia. Colonoscopy 2013 found no cancers or polyps.  Her biggest GI related symptom is anorexia, some abdominal pain predominantly in the lower abdomen.  She is here with her husband.  Was in hosp in [place] with CHF exacerbation (december 2013).   She does not eat normally per her husband  Has been on home oxygen.  She has pain in her lower abdomen. Tends to be constipated, uses miralax.  Still needs to strain.  Has lost 60 pounds in past year.  CT scan abd may 2013 showed no signficant pathology.   Review of systems: Pertinent positive and negative review of systems were noted in the above HPI section. Complete review of systems was performed and was otherwise normal.    Past Medical History  Diagnosis Date  . DM   . DYSLIPIDEMIA   . OBESITY NOS   . DEPRESSION, RECURRENT, IN PARTIAL REMISSION   . HYPERTENSION   . CORONARY ARTERY  DISEASE     S/p Q wave MI 2002 with RCA stent and AngioJet   Cath 4/30 99% distal LAD stenosis unchanged from previous, 20% left main stenosis, patent stents  . CONGESTIVE HEART FAILURE     EF 40-45% echo 05/2011  . GERD   . DIVERTICULOSIS, COLON, WITH HEMORRHAGE   . BACK PAIN, LUMBAR, WITH RADICULOPATHY   . OBSTRUCTIVE SLEEP APNEA   . Asthma   . Anemia, iron deficiency   . Hyponatremia 12/2011  . Tobacco abuse   . Moderate COPD (chronic obstructive pulmonary disease) 11/07/2005    Spirometry 03/09/11>>FEV1 2.02 (88%), FEV1% 76      Past Surgical History  Procedure Laterality Date  . Uvuloplasty    . Total abdominal hysterectomy w/ bilateral salpingoophorectomy    . Abdominal hysterectomy    . Cholecystectomy    . Coronary angioplasty with stent placement  2002    Current Outpatient Prescriptions  Medication Sig Dispense Refill  . acetaminophen (TYLENOL) 500 MG tablet Take 500 mg by mouth every 6 (six) hours as needed for pain.      [name] albuterol (PROVENTIL) (2.5 MG/3ML) 0.083% nebulizer solution Take 3 mLs (2.5 mg total) by nebulization every 6 (six) hours as needed for wheezing.  75 mL  12  . ALPRAZolam (XANAX) 0.25 MG tablet Take 1 tablet (0.25 mg total) by mouth as needed for sleep. Take for MRI, take 15-20 minutes before MRI, may take 1-2 pills, and take a third pill if needed.  3 tablet  0  .  aspirin 81 MG tablet Take 81 mg by mouth daily.        [name] atorvastatin (LIPITOR) 40 MG tablet Take 1 tablet (40 mg total) by mouth daily.  90 tablet  3  . feeding supplement (GLUCERNA SHAKE) LIQD Take 237 mLs by mouth 2 (two) times daily between meals.  60 Can  1  . ferrous fumarate (FERRETTS) 325 (106 FE) MG TABS Take 1 tablet (106 mg of iron total) by mouth 2 (two) times daily.  60 tablet  11  . furosemide (LASIX) 80 MG tablet Take 1 tablet (80 mg total) by mouth 2 (two) times daily.  60 tablet  6  . Gabapentin, PHN, 300 MG TABS Take 1 tablet by mouth 3 (three) times daily.  90 tablet  2   . hydrALAZINE (APRESOLINE) 50 MG tablet Take 1 tablet (50 mg total) by mouth every 8 (eight) hours.  90 tablet  6  . isosorbide mononitrate (IMDUR) 60 MG 24 hr tablet Take 1 tablet (60 mg total) by mouth daily.  30 tablet  11  . methocarbamol (ROBAXIN) 500 MG tablet Take 1 tablet (500 mg total) by mouth 2 (two) times daily as needed.  60 tablet  1  . nitroGLYCERIN (NITROSTAT) 0.4 MG SL tablet Place 1 tablet (0.4 mg total) under the tongue every 5 (five) minutes x 3 doses as needed for chest pain.  25 tablet  3  . olmesartan (BENICAR) 40 MG tablet Take 1 tablet (40 mg total) by mouth daily.  30 tablet  1  . pantoprazole (PROTONIX) 40 MG tablet Take 1 tablet (40 mg total) by mouth 2 (two) times daily.  60 tablet  3  . Polyethylene Glycol 3350 (MIRALAX PO) Take 17 g by mouth daily. As directed       . potassium chloride (MICRO-K) 10 MEQ CR capsule Take 2 capsules (20 mEq total) by mouth 2 (two) times daily.  120 capsule  6  . sitaGLIPtan-metformin (JANUMET) 50-500 MG per tablet Take 1 tablet by mouth 2 (two) times daily with a meal. HOLD 4/29 and 4/30. Restart 05/02/2012.  60 tablet  0  . tiotropium (SPIRIVA) 18 MCG inhalation capsule Place 1 capsule (18 mcg total) into inhaler and inhale daily.  30 capsule  5  . traZODone (DESYREL) 100 MG tablet Take 150 mg by mouth at bedtime. 1 and 1/2 pills at hs      . venlafaxine (EFFEXOR XR) 150 MG 24 hr capsule Take 150 mg by mouth 2 (two) times daily.        . [DISCONTINUED] budesonide-formoterol (SYMBICORT) 80-4.5 MCG/ACT inhaler Inhale 2 puffs into the lungs 2 (two) times daily.  1 Inhaler  12   No current facility-administered medications for this visit.    Allergies as of 05/22/2012 - Review Complete 05/22/2012  Allergen Reaction Noted  . Amlodipine Other (See Comments) 01/30/2011  . Ramipril Cough 09/26/2010  . Adhesive (tape) Hives 09/26/2010  . Penicillins Itching   . Codeine Rash 09/26/2010    Family History  Problem Relation Age of Onset   . Stroke Mother   . Breast cancer Sister   . Emphysema Sister   . Cancer Sister     breast  . Alcohol abuse Brother   . Colon cancer      uncles x 2  . Prostate cancer Brother     History   Social History  . Marital Status: Married    Spouse Name: N/A    Number of Children: 1  .  Years of Education: 12   Occupational History  . Retired Insurance account manager for [employer]    Social History Main Topics  . Smoking status: Current Some Day Smoker -- 1.00 packs/day for 20 years    Types: Cigarettes  . Smokeless tobacco: Never Used     Comment: Still smokes a little.  . Alcohol Use: No     Comment: Patient drinks cafinated daily.  . Drug Use: No  . Sexually Active: Not Currently    Birth Control/ Protection: Surgical   Other Topics Concern  . Not on file   Social History Narrative  . No narrative on file       Physical Exam: BP 112/68  Pulse 70  Ht 5\' 6"  (1.676 m)  Wt 146 lb (66.225 kg)  BMI 23.58 kg/m2 Constitutional:  Chronically ill-appearing, walks with a walker, she appears somewhat cachectic  Psychiatric: alert and oriented x3 Eyes: extraocular movements intact Mouth: oral pharynx moist, no lesions Neck: supple no lymphadenopathy Cardiovascular: heart regular rate and rhythm Lungs: clear to auscultation bilaterally Abdomen: soft, nontender, nondistended, no obvious ascites, no peritoneal signs, normal bowel sounds Extremities: no lower extremity edema bilaterally Skin: no lesions on visible extremities    Assessment and plan: 67 y.o. female with  significant weight loss, mild GI symptoms  I am most concerned by her significant weight loss and wonder about occult cancer. Colonoscopy was in 2013 and it does not need to be repeated right now. Going to have her get a basic set of labs including a CBC, complete metabolic profile. We will also set up CT scan abdomen and pelvis. If this is unrevealing then she'll need an EGD.

## 2012-06-06 NOTE — Op Note (Signed)
[facility] [address] [place], [address]   ENDOSCOPY PROCEDURE REPORT  PATIENT: [name], [name]  MR#: [id number] BIRTHDATE: 01/19/1945 , 67  yrs. old GENDER: Female ENDOSCOPIST: Rachael Fee, MD PROCEDURE DATE:  06/06/2012 PROCEDURE:  EGD w/ biopsy ASA CLASS:     Class IV INDICATIONS:  dyspepsia, signficant weight loss. MEDICATIONS: MAC sedation, administered by CRNA TOPICAL ANESTHETIC: Cetacaine Spray  DESCRIPTION OF PROCEDURE: After the risks benefits and alternatives of the procedure were thoroughly explained, informed consent was obtained.  The PENTAX GASTOROSCOPE [id number] endoscope was introduced through the mouth and advanced to the second portion of the duodenum. Without limitations.  The instrument was slowly withdrawn as the mucosa was fully examined.     There was moderate to severe gastritis, most significant in distal stomach.  There were no clearly neoplastic changes.  Multiple biopsies were taken and sent to pathology.  The examination was otherwise normal.  Retroflexed views revealed no abnormalities. The scope was then withdrawn from the patient and the procedure completed. COMPLICATIONS: There were no complications.  ENDOSCOPIC IMPRESSION: There was moderate to severe gastritis, most significant in distal stomach.  There were no clearly neoplastic changes.  Multiple biopsies were taken and sent to pathology.  The examination was otherwise normal.  RECOMMENDATIONS: Await final biopsy results.  If no H.  pylori and no neoplasm, the will consider SBFT test given CT suggestion of slightly dilated small bowel.    eSigned:  Rachael Fee, MD 06/06/2012 11:37 AM

## 2012-06-06 NOTE — Interval H&P Note (Signed)
History and Physical Interval Note:  06/06/2012 10:12 AM  [name]  has presented today for surgery, with the diagnosis of Small bowel anastomotic dilation [564.89]  The various methods of treatment have been discussed with the patient and family. After consideration of risks, benefits and other options for treatment, the patient has consented to  Procedure(s): ESOPHAGOGASTRODUODENOSCOPY (EGD) WITH PROPOFOL (N/A) as a surgical intervention .  The patient's history has been reviewed, patient examined, no change in status, stable for surgery.  I have reviewed the patient's chart and labs.  Questions were answered to the patient's satisfaction.     Rob Bunting

## 2012-06-07 ENCOUNTER — Encounter: Payer: Medicare Other | Admitting: Gastroenterology

## 2012-06-10 ENCOUNTER — Encounter (HOSPITAL_COMMUNITY): Payer: Self-pay | Admitting: Gastroenterology

## 2012-06-11 ENCOUNTER — Encounter: Payer: Self-pay | Admitting: Internal Medicine

## 2012-06-11 ENCOUNTER — Other Ambulatory Visit: Payer: Self-pay

## 2012-06-11 DIAGNOSIS — R109 Unspecified abdominal pain: Secondary | ICD-10-CM

## 2012-06-11 DIAGNOSIS — R634 Abnormal weight loss: Secondary | ICD-10-CM

## 2012-06-11 NOTE — Progress Notes (Signed)
930 06/14/12 [facility] radiology NPO midnight pt aware and instructed

## 2012-06-12 ENCOUNTER — Encounter
Payer: Medicare Other | Attending: Physical Medicine and Rehabilitation | Admitting: Physical Medicine and Rehabilitation

## 2012-06-12 ENCOUNTER — Encounter: Payer: Self-pay | Admitting: Physical Medicine and Rehabilitation

## 2012-06-12 VITALS — BP 118/71 | HR 90 | Resp 14 | Ht 69.0 in | Wt 143.0 lb

## 2012-06-12 DIAGNOSIS — I8390 Asymptomatic varicose veins of unspecified lower extremity: Secondary | ICD-10-CM | POA: Insufficient documentation

## 2012-06-12 DIAGNOSIS — M47812 Spondylosis without myelopathy or radiculopathy, cervical region: Secondary | ICD-10-CM

## 2012-06-12 DIAGNOSIS — R209 Unspecified disturbances of skin sensation: Secondary | ICD-10-CM | POA: Insufficient documentation

## 2012-06-12 DIAGNOSIS — M5030 Other cervical disc degeneration, unspecified cervical region: Secondary | ICD-10-CM | POA: Insufficient documentation

## 2012-06-12 MED ORDER — GABAPENTIN 300 MG PO CAPS: 300.0000 mg | ORAL_CAPSULE | Freq: Two times a day (BID) | ORAL | Status: DC

## 2012-06-12 MED ORDER — DICLOFENAC SODIUM 1 % TD GEL: 2.0000 g | Freq: Four times a day (QID) | TRANSDERMAL | Status: DC

## 2012-06-12 NOTE — Progress Notes (Signed)
Subjective:    Patient ID: [name], female    DOB: 08-20-1945, 67 y.o.   MRN: [id number]  HPI The patient is a 67 year old female, who presents with neck pain and right arm pain . The symptoms have improved tremendously since her last visit, after increasing her gabapentin slowly to 300mg  tid , and prescribing Robaxin and Mobic. Now the patient complains about mild pain , which radiates into her entire right arm in a non radicular pattern. Patient also complains about numbness and tingling in the same distribution, mainly in her fingers . She describes the pain as throbing . Applying heat, taking medications , changing positions alleviate the symptoms. Prolonged staying in one position aggrevates the symptoms. The patient grades her pain as a 7/10.  Patient has been following up with a cardiologist regularly. Today the patient complains about a new varicose vein on her right lower leg, laterally.  Pain Inventory Average Pain 7 Pain Right Now 5 My pain is intermittent, sharp, tingling and aching  In the last 24 hours, has pain interfered with the following? General activity 6 Relation with others 7 Enjoyment of life 7 What TIME of day is your pain at its worst? morning and night Sleep (in general) Fair  Pain is worse with: walking Pain improves with: medication and injections Relief from Meds: n/a  Mobility use a cane use a walker how many minutes can you walk? 10 ability to climb steps?  no do you drive?  no Do you have any goals in this area?  yes  Function retired I need assistance with the following:  meal prep, household duties and shopping Do you have any goals in this area?  no  Neuro/Psych weakness numbness tremor tingling trouble walking depression anxiety  Prior Studies Any changes since last visit?  no  Physicians involved in your care Any changes since last visit?  no   Family History  Problem Relation Age of Onset  . Stroke Mother   .  Breast cancer Sister   . Emphysema Sister   . Cancer Sister     breast  . Alcohol abuse Brother   . Colon cancer      uncles x 2  . Prostate cancer Brother    History   Social History  . Marital Status: Married    Spouse Name: N/A    Number of Children: 1  . Years of Education: 12   Occupational History  . Retired Insurance account manager for [employer]    Social History Main Topics  . Smoking status: Current Some Day Smoker -- 0.25 packs/day for 20 years    Types: Cigarettes  . Smokeless tobacco: Never Used     Comment: Still smokes a little.  . Alcohol Use: No     Comment: Patient drinks cafinated daily.  . Drug Use: No  . Sexually Active: Not Currently    Birth Control/ Protection: Surgical   Other Topics Concern  . None   Social History Narrative  . None   Past Surgical History  Procedure Laterality Date  . Uvuloplasty    . Total abdominal hysterectomy w/ bilateral salpingoophorectomy    . Abdominal hysterectomy    . Cholecystectomy  2004  . Coronary angioplasty with stent placement  2002    last 04-29-12  . Esophagogastroduodenoscopy (egd) with propofol N/A 06/06/2012    Procedure: ESOPHAGOGASTRODUODENOSCOPY (EGD) WITH PROPOFOL;  Surgeon: Rachael Fee, MD;  Location: [facility] END[facility];  Service: Endoscopy;  Laterality: N/A;  Past Medical History  Diagnosis Date  . DM   . DYSLIPIDEMIA   . OBESITY NOS   . DEPRESSION, RECURRENT, IN PARTIAL REMISSION   . HYPERTENSION   . 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 CONGESTIVE HEART FAILURE     EF 40-45% echo 05/2011  . GERD   . DIVERTICULOSIS, COLON, WITH HEMORRHAGE   . BACK PAIN, LUMBAR, WITH RADICULOPATHY   . OBSTRUCTIVE SLEEP APNEA   . Asthma   . Anemia, iron deficiency   . Hyponatremia 12/2011  . Tobacco abuse   . Moderate COPD (chronic obstructive pulmonary disease)  11/07/2005    Spirometry 03/09/11>>FEV1 2.02 (88%), FEV1% 76    . Anxiety   . Arthritis   . Blood transfusion without reported diagnosis 1970's  . Cataract   . Myocardial infarction 2001   BP 118/71  Pulse 90  Resp 14  Ht 5\' 9"  (1.753 m)  Wt 143 lb (64.864 kg)  BMI 21.11 kg/m2  SpO2 97%     Review of Systems  Constitutional: Positive for appetite change and unexpected weight change.  HENT: Positive for neck pain.   Respiratory: Positive for apnea.   Gastrointestinal: Positive for abdominal pain and constipation.  Musculoskeletal: Positive for back pain and gait problem.  Neurological: Positive for tremors, weakness and numbness.  Psychiatric/Behavioral: Positive for dysphoric mood. The patient is nervous/anxious.   All other systems reviewed and are negative.       Objective:   Physical Exam Constitutional: She is oriented to person, place, and time. She appears well-developed and well-nourished.  Walks with a cane  HENT:  Head: Normocephalic.  Neck: Neck supple.  Musculoskeletal: She exhibits tenderness.  Neurological: She is alert and oriented to person, place, and time.  Skin: Skin is warm and dry.  Psychiatric: She has a normal mood and affect.  Symmetric normal motor tone is noted throughout, except increased tone in neck muscles bilateral. Normal muscle bulk. Muscle testing reveals 5/5 muscle strength of the upper extremity, and 5/5 of the lower extremity. Full range of motion in upper and lower extremities. ROM of C-spine is mildly restricted. Fine motor movements are normal in both hands.  Sensory is intact and symmetric to light touch, pinprick and proprioception.  DTR in the upper and lower extremity are present and symmetric 1+. No clonus is noted.  Patient arises from chair with mild difficulty. Narrow based gait with with a cane. Varicose vein on lateral side of her right lower leg, about 12 cm long, no swelling, no increased temperature , no redness around  it.       Assessment & Plan:  MRI cervical 03/02/11  1. Cervical spondylosis and degenerative disc disease results in  varying degrees of foraminal impingement at all levels between C2 and  C7. Radiating pain and numbness in a non-radicular pattern.  2. MRI results, Despite efforts by the patient and technologist, motion  artifact is present on some series of today's examination and could  not be totally eliminated. This reduces diagnostic sensitivity and  Specificity.  3. Varicose vein lateral on right lower leg,about 12 cm long, no swelling, no increased temperature , no redness around it. Advised patient to follow up with her PCP, for possible referral to vascular specialist, also advised her if she notices swelling, redness, increase in temperature she  should see her PCP or urgent care asap. PLAN  Advised patient to rest her head, also to keep her head in a good posture.   Decrease Robaxin 500mg  from tid to prn, d/c Mobic 7.5 at last visit, which increased her pain some, cardiologist does not want her to be on an anti-inflammatory, advised patient to ask whether she is allowed to use Voltaren gel at the last visit, she states, that she is allowed to , continue the Gabapentin 300mg  bid, patient had some increased pain, when she ran out of this medication, refilled her Gabapentin.  Follow up in 5 month

## 2012-06-12 NOTE — Patient Instructions (Signed)
Follow up with your PCP for your varicose veins

## 2012-06-13 ENCOUNTER — Other Ambulatory Visit: Payer: Self-pay

## 2012-06-13 MED ORDER — OLMESARTAN MEDOXOMIL 40 MG PO TABS: 40.0000 mg | ORAL_TABLET | Freq: Every morning | ORAL | Status: DC

## 2012-06-14 ENCOUNTER — Ambulatory Visit (HOSPITAL_COMMUNITY)
Admission: RE | Admit: 2012-06-14 | Discharge: 2012-06-14 | Disposition: A | Discharge status: Home / Self Care | Payer: Medicare Other | Source: Ambulatory Visit | Attending: Gastroenterology | Admitting: Gastroenterology

## 2012-06-14 DIAGNOSIS — K5710 Diverticulosis of small intestine without perforation or abscess without bleeding: Secondary | ICD-10-CM | POA: Insufficient documentation

## 2012-06-14 DIAGNOSIS — R109 Unspecified abdominal pain: Secondary | ICD-10-CM

## 2012-06-14 DIAGNOSIS — R634 Abnormal weight loss: Secondary | ICD-10-CM

## 2012-06-14 DIAGNOSIS — E119 Type 2 diabetes mellitus without complications: Secondary | ICD-10-CM | POA: Insufficient documentation

## 2012-06-17 ENCOUNTER — Other Ambulatory Visit (HOSPITAL_BASED_OUTPATIENT_CLINIC_OR_DEPARTMENT_OTHER): Payer: Medicare Other

## 2012-06-17 ENCOUNTER — Encounter: Payer: Self-pay | Admitting: Internal Medicine

## 2012-06-17 DIAGNOSIS — D539 Nutritional anemia, unspecified: Secondary | ICD-10-CM

## 2012-06-17 DIAGNOSIS — D509 Iron deficiency anemia, unspecified: Secondary | ICD-10-CM

## 2012-06-17 LAB — CBC WITH DIFFERENTIAL/PLATELET
EOS%: 2.2 % (ref 0.0–7.0)
MCH: 31.8 pg (ref 25.1–34.0)
MCV: 92.1 fL (ref 79.5–101.0)
MONO%: 6.5 % (ref 0.0–14.0)
NEUT#: 3.5 10*3/uL (ref 1.5–6.5)
RBC: 3.66 10*6/uL — ABNORMAL LOW (ref 3.70–5.45)
RDW: 18.1 % — ABNORMAL HIGH (ref 11.2–14.5)
lymph#: 1.1 10*3/uL (ref 0.9–3.3)

## 2012-06-17 LAB — IRON AND TIBC
Iron: 72 ug/dL (ref 42–145)
TIBC: 251 ug/dL (ref 250–470)
UIBC: 179 ug/dL (ref 125–400)

## 2012-06-17 NOTE — Progress Notes (Signed)
Checked in for lab. °

## 2012-06-18 ENCOUNTER — Other Ambulatory Visit: Payer: Self-pay

## 2012-06-18 MED ORDER — METRONIDAZOLE 250 MG PO TABS: 250.0000 mg | ORAL_TABLET | Freq: Three times a day (TID) | ORAL | Status: DC

## 2012-06-18 MED ORDER — FUROSEMIDE 80 MG PO TABS: 80.0000 mg | ORAL_TABLET | Freq: Two times a day (BID) | ORAL | Status: DC

## 2012-06-19 ENCOUNTER — Ambulatory Visit (HOSPITAL_BASED_OUTPATIENT_CLINIC_OR_DEPARTMENT_OTHER): Payer: Medicare Other | Admitting: Internal Medicine

## 2012-06-19 ENCOUNTER — Telehealth: Payer: Self-pay | Admitting: Internal Medicine

## 2012-06-19 ENCOUNTER — Encounter: Payer: Self-pay | Admitting: Internal Medicine

## 2012-06-19 VITALS — BP 157/104 | HR 95 | Temp 97.3°F | Resp 20 | Ht 69.0 in | Wt 146.5 lb

## 2012-06-19 DIAGNOSIS — D509 Iron deficiency anemia, unspecified: Secondary | ICD-10-CM

## 2012-06-19 NOTE — Progress Notes (Signed)
[facility] [phone]   [phone]  OFFICE PROGRESS NOTE  [name], MD [address] [facility] [address] [place], 1st Floor [place] [address]  DIAGNOSIS:  Iron deficiency anemia  PRIOR THERAPY:Feraheme infusion x2 doses last dose was given on 05/10/2012.  CURRENT THERAPY: ferrous fumarate 325 mg by mouth twice a day.  INTERVAL HISTORY: [name] 67 y.o. female returns to the clinic today for follow up visit accompanied her husband. The patient is feeling much better today with no specific complaints. She denied having any significant fatigue or dizzy spells. She felt much better after the Feraheme infusion. She denied having any significant chest pain, shortness breath, cough or hemoptysis. She has no weight loss or night sweats.  MEDICAL HISTORY: Past Medical History  Diagnosis Date  . DM   . DYSLIPIDEMIA   . OBESITY NOS   . DEPRESSION, RECURRENT, IN PARTIAL REMISSION   . HYPERTENSION   . 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 CONGESTIVE HEART FAILURE     EF 40-45% echo 05/2011  . GERD   . DIVERTICULOSIS, COLON, WITH HEMORRHAGE   . BACK PAIN, LUMBAR, WITH RADICULOPATHY   . OBSTRUCTIVE SLEEP APNEA   . Asthma   . Anemia, iron deficiency   . Hyponatremia 12/2011  . Tobacco abuse   . Moderate COPD (chronic obstructive pulmonary disease) 11/07/2005    Spirometry 03/09/11>>FEV1 2.02 (88%), FEV1% 76    . Anxiety   . Arthritis   . Blood transfusion without reported diagnosis 1970's  . Cataract   . Myocardial infarction 2001    ALLERGIES:  is allergic to amlodipine; ramipril; adhesive; penicillins; and codeine.  MEDICATIONS:  Current Outpatient Prescriptions  Medication Sig Dispense Refill  . acetaminophen (TYLENOL) 500 MG tablet Take 500 mg by mouth every 6 (six) hours as needed for pain.       [name] albuterol (PROVENTIL) (2.5 MG/3ML) 0.083% nebulizer solution Take 2.5 mg by nebulization every 6 (six) hours as needed for wheezing.      [name] aspirin 81 MG tablet Take 81 mg by mouth daily.        [name] atorvastatin (LIPITOR) 40 MG tablet Take 40 mg by mouth every morning.      . diclofenac sodium (VOLTAREN) 1 % GEL Apply 2 g topically 4 (four) times daily.  2 Tube  2  . feeding supplement (GLUCERNA SHAKE) LIQD Take 237 mLs by mouth every morning.      . ferrous fumarate (FERRETTS) 325 (106 FE) MG TABS Take 1 tablet (106 mg of iron total) by mouth 2 (two) times daily.  60 tablet  11  . furosemide (LASIX) 80 MG tablet Take 1 tablet (80 mg total) by mouth 2 (two) times daily.  60 tablet  4  . gabapentin (NEURONTIN) 300 MG capsule Take 1 capsule (300 mg total) by mouth 2 (two) times daily.  60 capsule  5  . hydrALAZINE (APRESOLINE) 50 MG tablet Take 50 mg by mouth every 8 (eight) hours.      . hydrOXYzine (ATARAX/VISTARIL) 10 MG tablet Take 10 mg by mouth 2 (two) times daily.      . isosorbide mononitrate (IMDUR) 60 MG 24 hr tablet Take 60 mg by mouth daily at 12 noon.      . metroNIDAZOLE (  FLAGYL) 250 MG tablet Take 1 tablet (250 mg total) by mouth 3 (three) times daily.  42 tablet  0  . nitroGLYCERIN (NITROSTAT) 0.4 MG SL tablet Place 1 tablet (0.4 mg total) under the tongue every 5 (five) minutes x 3 doses as needed for chest pain.  25 tablet  3  . olmesartan (BENICAR) 40 MG tablet Take 1 tablet (40 mg total) by mouth every morning.  30 tablet  11  . pantoprazole (PROTONIX) 40 MG tablet Take 40 mg by mouth 2 (two) times daily.      . polyethylene glycol (MIRALAX / GLYCOLAX) packet Take 17 g by mouth daily as needed (for constipation).      . potassium chloride (MICRO-K) 10 MEQ CR capsule Take 2 capsules (20 mEq total) by mouth 2 (two) times daily.  120 capsule  6  . sitaGLIPtan-metformin (JANUMET) 50-500 MG per tablet Take 1 tablet by mouth 2 (two) times daily with a meal.      . tiotropium  (SPIRIVA) 18 MCG inhalation capsule Place 18 mcg into inhaler and inhale daily.      . traZODone (DESYREL) 100 MG tablet Take 150 mg by mouth at bedtime.      [name] venlafaxine XR (EFFEXOR-XR) 150 MG 24 hr capsule Take 150 mg by mouth every morning.      . [DISCONTINUED] budesonide-formoterol (SYMBICORT) 80-4.5 MCG/ACT inhaler Inhale 2 puffs into the lungs 2 (two) times daily.  1 Inhaler  12   No current facility-administered medications for this visit.    SURGICAL HISTORY:  Past Surgical History  Procedure Laterality Date  . Uvuloplasty    . Total abdominal hysterectomy w/ bilateral salpingoophorectomy    . Abdominal hysterectomy    . Cholecystectomy  2004  . Coronary angioplasty with stent placement  2002    last 04-29-12  . Esophagogastroduodenoscopy (egd) with propofol N/A 06/06/2012    Procedure: ESOPHAGOGASTRODUODENOSCOPY (EGD) WITH PROPOFOL;  Surgeon: Rachael Fee, MD;  Location: [facility];  Service: Endoscopy;  Laterality: N/A;    REVIEW OF SYSTEMS:  A comprehensive review of systems was negative.   PHYSICAL EXAMINATION: General appearance: alert, cooperative and no distress Head: Normocephalic, without obvious abnormality, atraumatic Neck: no adenopathy Lymph nodes: Cervical, supraclavicular, and axillary nodes normal. Resp: clear to auscultation bilaterally Cardio: regular rate and rhythm, S1, S2 normal, no murmur, click, rub or gallop GI: soft, non-tender; bowel sounds normal; no masses,  no organomegaly Extremities: extremities normal, atraumatic, no cyanosis or edema  ECOG PERFORMANCE STATUS: 0 - Asymptomatic  Blood pressure 157/104, pulse 95, temperature 97.3 F (36.3 C), temperature source Oral, resp. rate 20, height 5\' 9"  (1.753 m), weight 146 lb 8 oz (66.452 kg).  LABORATORY DATA: Lab Results  Component Value Date   WBC 5.0 06/17/2012   HGB 11.6 06/17/2012   HCT 33.7* 06/17/2012   MCV 92.1 06/17/2012   PLT 254 06/17/2012      Chemistry      Component Value  Date/Time   NA 139 06/06/2012 1037   NA 138 04/16/2012 0936   K 3.2* 06/06/2012 1037   K 4.0 04/16/2012 0936   CL 98 06/06/2012 1037   CL 98 04/16/2012 0936   CO2 32 06/06/2012 1037   CO2 30* 04/16/2012 0936   BUN 9 06/06/2012 1037   BUN 9.8 04/16/2012 0936   CREATININE 0.91 06/06/2012 1037   CREATININE 1.1 04/16/2012 0936      Component Value Date/Time   CALCIUM 9.3 06/06/2012 1037   CALCIUM  8.9 04/16/2012 0936   ALKPHOS 88 04/16/2012 0936   ALKPHOS 75 03/25/2012 1501   AST 13 04/16/2012 0936   AST 14 03/25/2012 1501   ALT 7 04/16/2012 0936   ALT 10 03/25/2012 1501   BILITOT 0.48 04/16/2012 0936   BILITOT 0.6 03/25/2012 1501       RADIOGRAPHIC STUDIES: Mr Brain Wo Contrast  05/22/2012   [facility]  NEUROIMAGING REPORT   STUDY DATE: 05/20/12 PATIENT NAME: RHYLEN PULIDO DOB: [birth year] MRN: [id number]  ORDERING CLINICIAN: Huston Foley, MD PhD  CLINICAL HISTORY: 67 year old female with memory loss.  EXAM: MRI brain (without)  TECHNIQUE: MRI of the brain without contrast was obtained utilizing 5 mm axial slices with T1, T2, T2 flair, SWI and diffusion weighted views.  T1 sagittal and T2 coronal views were obtained. CONTRAST: no IMAGING SITE: [facility] [address] (1.5 Tesla MRI)    FINDINGS:  No abnormal lesions are seen on diffusion-weighted views to suggest acute ischemia. The cortical sulci, fissures and cisterns are notable for mild perisylvian and severe mesial temporal atrophy. Lateral, third and fourth ventricle are normal in size and appearance. No extra-axial fluid collections are seen. No evidence of mass effect or midline shift. There is mild-moderate periventricular and subcortical and pontine chronic small vessel ischemic disease.  On sagittal views the posterior fossa, pituitary gland and corpus callosum are unremarkable. No evidence of intracranial hemorrhage on SWI views. Midline dural calcifications along the falx cerebri. The orbits and their contents,  paranasal sinuses and calvarium are unremarkable.  Intracranial flow voids are present.   IMPRESSION:  Abnormal MRI brain (without) demonstrating: 1. Mild perisylvian and severe mesial temporal atrophy. 2. Mild-moderate periventricular and subcortical and pontine chronic small vessel ischemic disease.   INTERPRETING PHYSICIAN:  Suanne Marker, MD Certified in Neurology, Neurophysiology and Neuroimaging  [facility] [address] [place], [place] [address] [phone]   Ct Abdomen Pelvis W Contrast  05/23/2012   *RADIOLOGY REPORT*  Clinical Data: Abdominal pain with weight loss and chronic constipation.  History of diverticulitis.  CT ABDOMEN AND PELVIS WITH CONTRAST  Technique:  Multidetector CT imaging of the abdomen and pelvis was performed following the standard protocol during bolus administration of intravenous contrast.  Contrast: OMNIPAQUE IOHEXOL 300 MG/ML  SOLN  Comparison: Abdominal pelvic CT 05/15/2011 and 09/17/2010.  Findings: The lung bases are clear.  There is a small right pleural effusion.  Coronary artery calcifications and mild left atrial enlargement are noted.  The pancreas appears chronically atrophied.  There is no biliary or pancreatic ductal dilatation status post cholecystectomy.  The liver appears unremarkable.  The spleen, adrenal glands and kidneys appear normal.  The stomach and proximal small bowel appear normal.  There is new mild dilatation of the mid small bowel.  The distal small bowel is relatively decompressed, but no focal transition point is identified.  The appendix appears normal.  There is moderate stool throughout the colon.  No colonic wall thickening, focal lesion or surrounding inflammatory change is identified.  There is no pelvic mass status post hysterectomy.  The bladder appears normal.  Diffuse aorto iliac atherosclerosis and probable postsurgical scarring around the umbilicus are unchanged.  No pathologically enlarged lymph  nodes are demonstrated.  Several small lymph nodes near the aortic bifurcation appear unchanged.  Degenerative disc disease at L4-L5 and asymmetric left hip arthropathy are noted.  No worrisome osseous findings are seen.  IMPRESSION:  1.  Mild  mid small bowel distension without demonstrated focal transition point.  This could reflect a low grade mechanical obstruction, ileus or dilatation related to constipation. Correlate clinically. 2.  No other significant changes are acute findings identified. There is stable pancreatic atrophy status post cholecystectomy. 3.  Diffuse aorto iliac atherosclerosis. 4.  Small right pleural effusion and left atrial enlargement.   Original Report Authenticated By: Carey Bullocks, M.D.   Dg Small Bowel  06/14/2012   *RADIOLOGY REPORT*  Clinical Data:  67 year old female with unintentional 100 pounds weight loss times 9 months.  Abdominal discomfort.  GI discomfort. History of diabetes.  SMALL BOWEL SERIES  Technique:  Following ingestion of a mixture of thin barium and EnteroVu, serial small bowel images were obtained including spot views of the terminal ileum.  Fluoroscopy Time: 1-minute-44-second  Comparison: CT abdomen and pelvis 05/23/2012 and earlier.  Findings: Preprocedural scout view of the abdomen. Nonobstructed bowel gas pattern.Right upper quadrant surgical clips.  Age- appropriate degenerative changes in the spine.  The patient tolerated the study well and without difficulty.  0-minute to image following one cup of p.o. contrast demonstrates a normal configuration of the stomach and duodenal C-loop.  Other proximal small bowel loops are faintly opacified and are mostly in the right upper quadrant.  30-minute image demonstrates good opacification of jejunum.  There is a 6 cm diameter duodenal diverticulum in the midline open (visible with at contrast fluid level on the recent comparison, series 2 image 30).  At 1 hour the jejunum is opacified.  Contrast has not  definitely reached the ileum.  Jejunal loops demonstrate normal course, diameter, contour, and mucosal pattern.  1 hour and 50 minutes demonstrate opacification of the jejunum and ileum.  Contrast has not yet reached the cecum.  Ileum loops demonstrate normal course, diameter, contour, and mucosal pattern.  Contrast reached the colon at about 2 hours and 15 minutes.  No dilated small bowel loops.  Fluoroscopic evaluation of the small bowel was then undertaken. The terminal ileum is difficult to isolate, but appears normal (series 3 arrow).  Normal fluoroscopic appearance and findings of the other small bowel loops.  Midline duodenal diverticulum re- identified.  IMPRESSION: 1.  Negative small bowel follow-through except for the presence of a 6 cm duodenal diverticulum near midline. 2.  Transit time to the colon approximately 2 hours and 15 minutes.   Original Report Authenticated By: Erskine Speed, M.D.    ASSESSMENT AND PLAN: this is a very pleasant 67 years old African American female with history of iron deficiency anemia status post Feraheme infusion and currently on ferrous fumarate orally. The patient is doing fine and there is significant improvement in her hemoglobin and hematocrit as well as the iron study. I recommended for her to continue on her oral iron tablets for now. I would see her back for follow up visit in 3 months with repeat CBC and iron study She was advised to call immediately if she has any concerning symptoms in the interval.  All questions were answered. The patient knows to call the clinic with any problems, questions or concerns. We can certainly see the patient much sooner if necessary.

## 2012-06-19 NOTE — Telephone Encounter (Signed)
gv and printed appt sched and avs for pt  °

## 2012-06-19 NOTE — Patient Instructions (Signed)
Continue oral iron tablets.  Followup visit in 3 months with repeat CBC and iron study.

## 2012-07-03 DIAGNOSIS — R413 Other amnesia: Secondary | ICD-10-CM

## 2012-07-16 ENCOUNTER — Other Ambulatory Visit: Payer: Medicare Other

## 2012-07-16 ENCOUNTER — Encounter: Payer: Self-pay | Admitting: Gastroenterology

## 2012-07-16 ENCOUNTER — Ambulatory Visit (INDEPENDENT_AMBULATORY_CARE_PROVIDER_SITE_OTHER): Payer: Medicare Other | Admitting: Gastroenterology

## 2012-07-16 VITALS — BP 130/72 | HR 100 | Ht 69.0 in | Wt 144.8 lb

## 2012-07-16 DIAGNOSIS — R634 Abnormal weight loss: Secondary | ICD-10-CM

## 2012-07-16 DIAGNOSIS — K5900 Constipation, unspecified: Secondary | ICD-10-CM

## 2012-07-16 NOTE — Progress Notes (Signed)
Review of pertinent gastrointestinal problems:  1. Likely diabetic gastroparesis causing nausea, bloating, some abdominal discomfort. I do not see any previous gastric emptying scans to prove this.  2. History of colon polyps. Last colonoscopy June 2008 by Dr. Victorino Dike found diverticulosis, tortuous colon, no polyps. He recommended she have a repeat colonoscopy in 8 years. I have not found polyp pathology in her chart. Probable diverticular bleed February, 2009. Admitted to the hospital with bright red blood per rectum, mild anemia, bleeding resolved without intervention. No need for blood transfusion. Colonoscopy 2013 found no polyps, recommended recall at 5 years  3. Chronic constipation. Constipation imroving with daily Miralax. 4. Weight loss, anorexia, mild abd pains 2014: CT 05/2012 mild mid SB dilation without focal transition, unclear if this is pathologic; UGI with SBFT 06/2012 normal except 6cm duodenal diverticulum; EGD 05/2012 mod to severe gastritis, biopsies showed no h. Pylori   HPI: This is a    very pleasant 67 year old woman who is here with her husband today. I last saw her about 6 weeks ago.  Still having stomach pains, she has a lot of gas discomforts, wonders if gas may be causing it.  She has issues with constipation.  She will go 2 times per week only.  A lot of straining.   Past Medical History  Diagnosis Date  . DM   . DYSLIPIDEMIA   . OBESITY NOS   . DEPRESSION, RECURRENT, IN PARTIAL REMISSION   . HYPERTENSION   . 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 CONGESTIVE HEART FAILURE     EF 40-45% echo 05/2011  . GERD   . DIVERTICULOSIS, COLON, WITH HEMORRHAGE   . BACK PAIN, LUMBAR, WITH RADICULOPATHY   . OBSTRUCTIVE SLEEP APNEA   . Asthma   . Anemia, iron deficiency   . Hyponatremia 12/2011  . Tobacco abuse   . Moderate COPD  (chronic obstructive pulmonary disease) 11/07/2005    Spirometry 03/09/11>>FEV1 2.02 (88%), FEV1% 76    . Anxiety   . Arthritis   . Blood transfusion without reported diagnosis 1970's  . Cataract   . Myocardial infarction 2001    Past Surgical History  Procedure Laterality Date  . Uvuloplasty    . Total abdominal hysterectomy w/ bilateral salpingoophorectomy    . Abdominal hysterectomy    . Cholecystectomy  2004  . Coronary angioplasty with stent placement  2002    last 04-29-12  . Esophagogastroduodenoscopy (egd) with propofol N/A 06/06/2012    Procedure: ESOPHAGOGASTRODUODENOSCOPY (EGD) WITH PROPOFOL;  Surgeon: Rachael Fee, MD;  Location: [facility];  Service: Endoscopy;  Laterality: N/A;    Current Outpatient Prescriptions  Medication Sig Dispense Refill  . acetaminophen (TYLENOL) 500 MG tablet Take 500 mg by mouth every 6 (six) hours as needed for pain.      [name] albuterol (PROVENTIL) (2.5 MG/3ML) 0.083% nebulizer solution Take 2.5 mg by nebulization every 6 (six) hours as needed for wheezing.      [name] aspirin 81 MG tablet Take 81 mg by mouth daily.        [name] atorvastatin (LIPITOR) 40 MG tablet Take 40 mg by mouth every morning.      . diclofenac sodium (VOLTAREN) 1 % GEL Apply 2 g topically 4 (four) times daily.  2 Tube  2  . feeding supplement (GLUCERNA SHAKE) LIQD  Take 237 mLs by mouth every morning.      . ferrous fumarate (FERRETTS) 325 (106 FE) MG TABS Take 1 tablet (106 mg of iron total) by mouth 2 (two) times daily.  60 tablet  11  . furosemide (LASIX) 80 MG tablet Take 1 tablet (80 mg total) by mouth 2 (two) times daily.  60 tablet  4  . gabapentin (NEURONTIN) 300 MG capsule Take 1 capsule (300 mg total) by mouth 2 (two) times daily.  60 capsule  5  . hydrALAZINE (APRESOLINE) 50 MG tablet Take 50 mg by mouth every 8 (eight) hours.      . hydrOXYzine (ATARAX/VISTARIL) 10 MG tablet Take 10 mg by mouth 2 (two) times daily.      . isosorbide mononitrate (IMDUR) 60 MG 24 hr  tablet Take 60 mg by mouth daily at 12 noon.      . metroNIDAZOLE (FLAGYL) 250 MG tablet Take 1 tablet (250 mg total) by mouth 3 (three) times daily.  42 tablet  0  . nitroGLYCERIN (NITROSTAT) 0.4 MG SL tablet Place 1 tablet (0.4 mg total) under the tongue every 5 (five) minutes x 3 doses as needed for chest pain.  25 tablet  3  . olmesartan (BENICAR) 40 MG tablet Take 1 tablet (40 mg total) by mouth every morning.  30 tablet  11  . pantoprazole (PROTONIX) 40 MG tablet Take 40 mg by mouth 2 (two) times daily.      . polyethylene glycol (MIRALAX / GLYCOLAX) packet Take 17 g by mouth daily as needed (for constipation).      . potassium chloride (MICRO-K) 10 MEQ CR capsule Take 2 capsules (20 mEq total) by mouth 2 (two) times daily.  120 capsule  6  . sitaGLIPtan-metformin (JANUMET) 50-500 MG per tablet Take 1 tablet by mouth 2 (two) times daily with a meal.      . tiotropium (SPIRIVA) 18 MCG inhalation capsule Place 18 mcg into inhaler and inhale daily.      . traZODone (DESYREL) 100 MG tablet Take 150 mg by mouth at bedtime.      [name] venlafaxine XR (EFFEXOR-XR) 150 MG 24 hr capsule Take 150 mg by mouth every morning.      . [DISCONTINUED] budesonide-formoterol (SYMBICORT) 80-4.5 MCG/ACT inhaler Inhale 2 puffs into the lungs 2 (two) times daily.  1 Inhaler  12   No current facility-administered medications for this visit.    Allergies as of 07/16/2012 - Review Complete 07/16/2012  Allergen Reaction Noted  . Amlodipine Other (See Comments) 01/30/2011  . Ramipril Cough 09/26/2010  . Adhesive (tape) Hives 09/26/2010  . Penicillins Itching   . Codeine Rash 09/26/2010    Family History  Problem Relation Age of Onset  . Stroke Mother   . Breast cancer Sister   . Emphysema Sister   . Cancer Sister     breast  . Alcohol abuse Brother   . Colon cancer      uncles x 2  . Prostate cancer Brother     History   Social History  . Marital Status: Married    Spouse Name: N/A    Number of  Children: 1  . Years of Education: 12   Occupational History  . Retired Insurance account manager for [employer]    Social History Main Topics  . Smoking status: Current Some Day Smoker -- 0.25 packs/day for 20 years    Types: Cigarettes  . Smokeless tobacco: Never Used     Comment:  Still smokes a little.  . Alcohol Use: No     Comment: Patient drinks cafinated daily.  . Drug Use: No  . Sexually Active: Not Currently    Birth Control/ Protection: Surgical   Other Topics Concern  . Not on file   Social History Narrative  . No narrative on file      Physical Exam: BP 130/72  Pulse 100  Ht 5\' 9"  (1.753 m)  Wt 144 lb 12.8 oz (65.681 kg)  BMI 21.37 kg/m2 Constitutional: generally well-appearing Psychiatric: alert and oriented x3 Abdomen: soft, nontender, nondistended, no obvious ascites, no peritoneal signs, normal bowel sounds     Assessment and plan: 67 y.o. female with  chronic abdominal pains, constipation, iron deficiency anemia  I don't see that ever tested her for celiac sprue. He is a long shot given her at the background, the fact that she is predominantly constipated however if she does have celiac sprue it would explain her iron deficiency as well as her abdominal discomforts. And her weight loss. Today she is predominantly discussing constipation, discomforts related to her constipation. She is going to restart MiraLax at one dose per day. She will return to see me in 6-8 weeks and sooner if needed

## 2012-07-16 NOTE — Patient Instructions (Addendum)
You will have labs checked today in the basement lab.  Please head down after you check out with the front desk  (celiac sprue panel). Restart miralax, one dose every single day for your constipation. Can increase if needed. Please return to see Dr. Christella Hartigan in 6-8 weeks.

## 2012-07-17 LAB — CELIAC PANEL 10
Endomysial Screen: NEGATIVE
Gliadin IgA: 4.3 U/mL (ref <20)
Gliadin IgG: 7.9 U/mL (ref <20)
IgA: 268 mg/dL (ref 69–380)
Tissue Transglut Ab: 8 U/mL (ref <20)
Tissue Transglutaminase Ab, IgA: 3.3 U/mL (ref <20)

## 2012-07-30 ENCOUNTER — Other Ambulatory Visit (HOSPITAL_COMMUNITY): Payer: Self-pay | Admitting: Cardiology

## 2012-07-30 ENCOUNTER — Other Ambulatory Visit: Payer: Self-pay | Admitting: Internal Medicine

## 2012-07-31 ENCOUNTER — Telehealth: Payer: Self-pay | Admitting: Cardiology

## 2012-07-31 ENCOUNTER — Telehealth: Payer: Self-pay | Admitting: Internal Medicine

## 2012-07-31 DIAGNOSIS — I5022 Chronic systolic (congestive) heart failure: Secondary | ICD-10-CM

## 2012-07-31 DIAGNOSIS — R0600 Dyspnea, unspecified: Secondary | ICD-10-CM

## 2012-07-31 NOTE — Telephone Encounter (Signed)
Pt calls with increasing shortness of breath over the past week accompanied by edema of the legs which resolves when elevated. States her weight is up to 148-149  ( states normal weight around 145-max 149)  Pt quit smoking 2 weeks ago.  Dr. Excell Seltzer (DOD) has reviewed & pt will have chest xray,bmp & bnp tomorrow at [facility]. Pt agrees with this plan.  Has scheduled follow-up with PA on 08/07/12 Mylo Red RN

## 2012-07-31 NOTE — Telephone Encounter (Signed)
New Prob  Pt states she is not using the bathroom like she use to. She wants to know what she can take for it.

## 2012-07-31 NOTE — Telephone Encounter (Signed)
Pt takes lasix.  Her breathing is worse with the COPD.  She wants to know if she can have something stronger than the lasix she takes.

## 2012-07-31 NOTE — Telephone Encounter (Signed)
She needs to call her cardiologist

## 2012-08-01 ENCOUNTER — Ambulatory Visit (INDEPENDENT_AMBULATORY_CARE_PROVIDER_SITE_OTHER)
Admission: RE | Admit: 2012-08-01 | Discharge: 2012-08-01 | Disposition: A | Discharge status: Home / Self Care | Payer: Medicare Other | Source: Ambulatory Visit | Attending: Cardiovascular Disease | Admitting: Cardiovascular Disease

## 2012-08-01 ENCOUNTER — Other Ambulatory Visit (INDEPENDENT_AMBULATORY_CARE_PROVIDER_SITE_OTHER): Payer: Medicare Other

## 2012-08-01 ENCOUNTER — Other Ambulatory Visit: Payer: Medicare Other

## 2012-08-01 DIAGNOSIS — R0600 Dyspnea, unspecified: Secondary | ICD-10-CM

## 2012-08-01 DIAGNOSIS — I5022 Chronic systolic (congestive) heart failure: Secondary | ICD-10-CM

## 2012-08-01 DIAGNOSIS — R0609 Other forms of dyspnea: Secondary | ICD-10-CM

## 2012-08-01 DIAGNOSIS — R0989 Other specified symptoms and signs involving the circulatory and respiratory systems: Secondary | ICD-10-CM

## 2012-08-01 LAB — BASIC METABOLIC PANEL
CO2: 29 mEq/L (ref 19–32)
Chloride: 95 mEq/L — ABNORMAL LOW (ref 96–112)
Glucose, Bld: 165 mg/dL — ABNORMAL HIGH (ref 70–99)
Potassium: 3.8 mEq/L (ref 3.5–5.1)
Sodium: 133 mEq/L — ABNORMAL LOW (ref 135–145)

## 2012-08-01 NOTE — Telephone Encounter (Signed)
Pt is aware and has already called her cardiologist.

## 2012-08-07 ENCOUNTER — Ambulatory Visit (INDEPENDENT_AMBULATORY_CARE_PROVIDER_SITE_OTHER): Payer: Medicare Other | Admitting: Physician Assistant

## 2012-08-07 ENCOUNTER — Telehealth (HOSPITAL_COMMUNITY): Payer: Self-pay | Admitting: *Deleted

## 2012-08-07 ENCOUNTER — Other Ambulatory Visit: Payer: Self-pay | Admitting: Cardiology

## 2012-08-07 ENCOUNTER — Encounter: Payer: Self-pay | Admitting: Physician Assistant

## 2012-08-07 VITALS — BP 120/60 | HR 91 | Ht 69.0 in | Wt 154.0 lb

## 2012-08-07 DIAGNOSIS — I2510 Atherosclerotic heart disease of native coronary artery without angina pectoris: Secondary | ICD-10-CM

## 2012-08-07 DIAGNOSIS — R079 Chest pain, unspecified: Secondary | ICD-10-CM

## 2012-08-07 DIAGNOSIS — I5022 Chronic systolic (congestive) heart failure: Secondary | ICD-10-CM

## 2012-08-07 DIAGNOSIS — I509 Heart failure, unspecified: Secondary | ICD-10-CM

## 2012-08-07 DIAGNOSIS — R55 Syncope and collapse: Secondary | ICD-10-CM

## 2012-08-07 DIAGNOSIS — R0602 Shortness of breath: Secondary | ICD-10-CM

## 2012-08-07 MED ORDER — FUROSEMIDE 80 MG PO TABS: ORAL_TABLET | ORAL | Status: DC

## 2012-08-07 MED ORDER — POTASSIUM CHLORIDE ER 10 MEQ PO CPCR: ORAL_CAPSULE | ORAL | Status: DC

## 2012-08-07 NOTE — Assessment & Plan Note (Signed)
Patient has acute on chronic heart failure on exam today. Her BNP was 1200 and chest x-ray showed mild vascular congestion. Oral increase her Lasix to 120 mg twice a day for 3 days and then 120 mg in the morning 80 in the evening. We will also increase her potassium to 10 mEq 3 tablets twice a day for 3 days and then 3 tablets in the morning 2 in the evening. We will check blood work next week and I will see her back in 2 weeks. She will see Dr.Hochrein in one month. 2 g sodium diet was discussed with patient and her husband and a diet was given.  There is a discrepancy in her ejection fractions. Echo demonstrated an EF of 45%, cardiac cath demonstrated an EF of 20%, and stress Myoview demonstrated a EF of 14%. These were all done in April of this year.

## 2012-08-07 NOTE — Progress Notes (Signed)
This is a 67 year old female patient of Dr.Hochrein who comes in today for recent trouble with shortness of breath. Chest x-ray on 08/01/12 showed mild CHF with a small right effusion and BNP was 1286. She was hospitalized back in April of this year with chest pain and syncope. The syncope was felt to be related to bradycardia from beta blockers which were discontinued. 2-D echo demonstrated an EF of 45% but EF was 14% on stress Myoview. Cardiac catheterization gauge the ejection fraction at 20%. She also had 20% left main, distal LAD was very small and diseased with apical 99% stenosis unchanged, circumflex had some moderate plaque. There was admitted stent in the right coronary artery that was patent. The distal stent had some mild restenosis. There was some distal PDA 99% stenosis but this was a very small caliber of the vessel. Medical management was recommended. She is also had recent trouble with iron deficiency anemia and weight loss. She is seeing GI for this.  The patient states she has trouble laying down at night. She becomes short of breath and has tightness in her chest. She has to sit up most nights. She is taking Lasix 80 mg twice a day. She says she hasn't missed any doses. She tries to watch her salt closely but does be quite a bit of canned soups. She says she has her good days and bad days but has had the wear her oxygen 24 hours a day recently. She also complains of decrease in her urinary frequency. She was treated for a UTI but denies any burning.   Allergies: -- Amlodipine -- Other (See Comments)   --  constipation  -- Ramipril -- Cough  -- Adhesive (Tape) -- Hives   --  "peels skin off"  -- Penicillins -- Itching  -- Codeine -- Rash  Current Outpatient Prescriptions on File Prior to Visit: acetaminophen (TYLENOL) 500 MG tablet, Take 500 mg by mouth every 6 (six) hours as needed for pain., Disp: , Rfl:  albuterol (PROVENTIL) (2.5 MG/3ML) 0.083% nebulizer solution, Take 2.5 mg by  nebulization every 6 (six) hours as needed for wheezing., Disp: , Rfl:  aspirin 81 MG tablet, Take 81 mg by mouth daily.  , Disp: , Rfl:  atorvastatin (LIPITOR) 40 MG tablet, Take 40 mg by mouth every morning., Disp: , Rfl:  BENICAR 40 MG tablet, TAKE 1 TABLET BY MOUTH DAILY, Disp: 90 tablet, Rfl: 3 diclofenac sodium (VOLTAREN) 1 % GEL, Apply 2 g topically 4 (four) times daily., Disp: 2 Tube, Rfl: 2 feeding supplement (GLUCERNA SHAKE) LIQD, Take 237 mLs by mouth every morning., Disp: , Rfl:  ferrous fumarate (FERRETTS) 325 (106 FE) MG TABS, Take 1 tablet (106 mg of iron total) by mouth 2 (two) times daily., Disp: 60 tablet, Rfl: 11 furosemide (LASIX) 80 MG tablet, Take 1 tablet (80 mg total) by mouth 2 (two) times daily., Disp: 60 tablet, Rfl: 4 gabapentin (NEURONTIN) 300 MG capsule, Take 1 capsule (300 mg total) by mouth 2 (two) times daily., Disp: 60 capsule, Rfl: 5 hydrALAZINE (APRESOLINE) 50 MG tablet, Take 50 mg by mouth every 8 (eight) hours., Disp: , Rfl:  hydrOXYzine (ATARAX/VISTARIL) 10 MG tablet, Take 10 mg by mouth 2 (two) times daily., Disp: , Rfl:  isosorbide mononitrate (IMDUR) 60 MG 24 hr tablet, Take 60 mg by mouth daily at 12 noon., Disp: , Rfl:  metroNIDAZOLE (FLAGYL) 250 MG tablet, Take 1 tablet (250 mg total) by mouth 3 (three) times daily., Disp: 42 tablet, Rfl: 0  nitroGLYCERIN (NITROSTAT) 0.4 MG SL tablet, Place 1 tablet (0.4 mg total) under the tongue every 5 (five) minutes x 3 doses as needed for chest pain., Disp: 25 tablet, Rfl: 3 olmesartan (BENICAR) 40 MG tablet, Take 1 tablet (40 mg total) by mouth every morning., Disp: 30 tablet, Rfl: 11 pantoprazole (PROTONIX) 40 MG tablet, Take 40 mg by mouth 2 (two) times daily., Disp: , Rfl:  polyethylene glycol (MIRALAX / GLYCOLAX) packet, Take 17 g by mouth daily as needed (for constipation)., Disp: , Rfl:  potassium chloride (MICRO-K) 10 MEQ CR capsule, TAKE 2 CAPSULES BY MOUTH TWICE DAILY, Disp: 120 capsule, Rfl:  2 sitaGLIPtan-metformin (JANUMET) 50-500 MG per tablet, Take 1 tablet by mouth 2 (two) times daily with a meal., Disp: , Rfl:  tiotropium (SPIRIVA) 18 MCG inhalation capsule, Place 18 mcg into inhaler and inhale daily., Disp: , Rfl:  traZODone (DESYREL) 100 MG tablet, Take 150 mg by mouth at bedtime., Disp: , Rfl:  venlafaxine XR (EFFEXOR-XR) 150 MG 24 hr capsule, Take 150 mg by mouth every morning., Disp: , Rfl:  [DISCONTINUED] budesonide-formoterol (SYMBICORT) 80-4.5 MCG/ACT inhaler, Inhale 2 puffs into the lungs 2 (two) times daily., Disp: 1 Inhaler, Rfl: 12  No current facility-administered medications on file prior to visit.   Past Medical History:   DM                                                           DYSLIPIDEMIA                                                 OBESITY NOS                                                  DEPRESSION, RECURRENT, IN PARTIAL REMISSION                  HYPERTENSION                                                 CORONARY ARTERY DISEASE                                        Comment:S/p Q wave MI 2002 with RCA stent and AngioJet               Cath 4/30 99% distal LAD stenosis unchanged               from previous, 20% left main stenosis, patent               stents   CONGESTIVE HEART FAILURE  Comment:EF 40-45% echo 05/2011   GERD                                                         DIVERTICULOSIS, COLON, WITH HEMORRHAGE                       BACK PAIN, LUMBAR, WITH RADICULOPATHY                        OBSTRUCTIVE SLEEP APNEA                                      Asthma                                                       Anemia, iron deficiency                                      Hyponatremia                                    12/2011      Tobacco abuse                                                Moderate COPD (chronic obstructive pulmonary d* 11/07/2005      Comment:Spirometry 03/09/11>>FEV1 2.02  (88%), FEV1% 76    Anxiety                                                      Arthritis                                                    Blood transfusion without reported diagnosis    1970's       Cataract                                                     Myocardial infarction                           2001        Past Surgical History:   uvuloplasty  TOTAL ABDOMINAL HYSTERECTOMY W/ BILATERAL SALP*               ABDOMINAL HYSTERECTOMY                                        CHOLECYSTECTOMY                                  2004         CORONARY ANGIOPLASTY WITH STENT PLACEMENT        2002           Comment:last 04-29-12   ESOPHAGOGASTRODUODENOSCOPY (EGD) WITH PROPOFOL  N/A 06/06/2012       Comment:Procedure: ESOPHAGOGASTRODUODENOSCOPY (EGD)               WITH PROPOFOL;  Surgeon: Rachael Fee, MD;                Location: [facility];  Service: Endoscopy;                Laterality: N/A;  Review of patient's family history indicates:   Stroke                         Mother                   Breast cancer                  Sister                   Emphysema                      Sister                   Cancer                         Sister                     Comment: breast   Alcohol abuse                  Brother                  Colon cancer                                              Comment: uncles x 2   Prostate cancer                Brother                  Social History   Marital Status: Married             Spouse Name:                      Years of Education: 12              Number of children: 1           Occupational History Occupation          Associate Professor  Comment              Retired Engineer, structural*                       Social History Main Topics   Smoking Status: Current Some Day Smoker         Packs/Day: 0.25  Years: 20        Types: Cigarettes   Smokeless Status: Never Used                        Comment: Still smokes a little.   Alcohol Use: No                Comment: Patient drinks cafinated daily.   Drug Use: No             Sexual Activity: Not Currently          Birth Control/Protection: Surgical  Other Topics            Concern   None on file  Social History Narrative   None on file    ROS: Continues to have GI problems for which he is being followed by GI ,See history of present illness otherwise negative   PHYSICAL EXAM: Elderly in a wheelchair on oxygen, in no acute distress. Neck: Increased JVD, no HJR, Bruit, or thyroid enlargement  Lungs: Decreased breath sounds at the bases otherwise No tachypnea, clear without wheezing, rales, or rhonchi  Cardiovascular: RRR, PMI not displaced, positive S4 1/6 systolic murmur at the left sternal border, no bruit, thrill, or heave.  Abdomen: BS normal. Soft without organomegaly, masses, lesions or tenderness.  Extremities: +1 edema bilaterally up her knees otherwise lower extremities without cyanosis, clubbing. Good distal pulses bilateral  SKin: Warm, no lesions or rashes   Musculoskeletal: No deformities  Neuro: no focal signs  BP 120/60  Pulse 91  Ht 5\' 9"  (1.753 m)  Wt 154 lb (69.854 kg)  BMI 22.73 kg/m2    EKG: Normal sinus rhythm at 91 beats per minute with first-degree AV block nonspecific ST changes  Cardiac Cath:  04/29/2012 Left main: 20% plaque.  Left Anterior Descending Artery: Large caliber vessel that courses to the apex. The proximal and mid vessel has mild plaque disease. The distal vessel becomes very small in caliber and has diffuse disease. There appears to be a focal 99% stenosis but this is in the very distal, smaller segment as it approaches the apex. This is too small for PCI. Unchanged from cath in 2010 and 2013.   Circumflex Artery: Moderate sized vessel with mild plaque in mid vessel.  Right Coronary Artery: Large dominant vessel with patent stents in the mid and distal vessel. There is a  30% stenosis in the proximal vessel just before the stent. The mid stent is patent with mild restenosis. The mid vessel has a focal 40-50% stenosis. The distal stent is patent with mild in stent restenosis. The PL branch and PDA are small to moderate sized, long branches. The PDA becomes very small caliber and has serial 99% stenoses in this very small caliber segment of the vessel. (1.0 mm). The posterolateral branches have mild plaque disease.   Left Ventricular Angiogram: LVEF=20% with global hypokinesis.  Impression:  1. 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   2. Moderate to severe global LV systolic dysfunction.   Recommendations: Continue medical management  of CAD. No focal targets for PCI.    2Decho 04/24/12: Study Conclusions  - Left ventricle: The cavity size was mildly dilated. Wall   thickness was increased in a pattern of mild LVH. Systolic   function was mildly to moderately reduced. The estimated   ejection fraction was in the range of 40% to 45%. Wall   motion was normal; there were no regional wall motion   abnormalities. - Left atrium: The atrium was mildly dilated. - Right atrium: The atrium was mildly to moderately dilated. - Pulmonary arteries: Systolic pressure was moderately   increased.  Stress myoview:04/27/12: IMPRESSION:  1.  Large infarct involves the inferior and inferior lateral wall. 2.  Evidence of peri infarct ischemia involving the apical segments of the anterior lateral wall, anterior wall and septum. 3.  Markedly diminished.  Left ventricular systolic function.  The left ventricular ejection fraction equals 14%.

## 2012-08-07 NOTE — Assessment & Plan Note (Signed)
Blood pressure is stable 

## 2012-08-07 NOTE — Patient Instructions (Addendum)
Your physician has recommended you make the following change in your medication:   1. Increase Lasix to 3 tablets in the morning and 3 tablets at night for 3 days , then take 2 tablets in th e morning and 1 tablet at night.  2. Increase Potassium to 3 tablets in the morning and 3 tablets at night for 3 days, then take 3 tablets in the morning and 1 tablet at night.  Your physician recommends that you return for lab work in: 1 week for BMET, BNP  Your physician recommends that you schedule a follow-up appointment in: 2 week with Herma Carson  Your physician recommends that you schedule a follow-up appointment in: 1 month with [name]  Start 2 gram Sodium Diet  2 Gram Low Sodium Diet A 2 gram sodium diet restricts the amount of sodium in the diet to no more than 2 g or 2000 mg daily. Limiting the amount of sodium is often used to help lower blood pressure. It is important if you have heart, liver, or kidney problems. Many foods contain sodium for flavor and sometimes as a preservative. When the amount of sodium in a diet needs to be low, it is important to know what to look for when choosing foods and drinks. The following includes some information and guidelines to help make it easier for you to adapt to a low sodium diet. QUICK TIPS  Do not add salt to food.  Avoid convenience items and fast food.  Choose unsalted snack foods.  Buy lower sodium products, often labeled as "lower sodium" or "no salt added."  Check food labels to learn how much sodium is in 1 serving.  When eating at a restaurant, ask that your food be prepared with less salt or none, if possible. READING FOOD LABELS FOR SODIUM INFORMATION The nutrition facts label is a good place to find how much sodium is in foods. Look for products with no more than 500 to 600 mg of sodium per meal and no more than 150 mg per serving. Remember that 2 g = 2000 mg. The food label may also list foods as:  Sodium-free: Less than  5 mg in a serving.  Very low sodium: 35 mg or less in a serving.  Low-sodium: 140 mg or less in a serving.  Light in sodium: 50% less sodium in a serving. For example, if a food that usually has 300 mg of sodium is changed to become light in sodium, it will have 150 mg of sodium.  Reduced sodium: 25% less sodium in a serving. For example, if a food that usually has 400 mg of sodium is changed to reduced sodium, it will have 300 mg of sodium. CHOOSING FOODS Grains  Avoid: Salted crackers and snack items. Some cereals, including instant hot cereals. Bread stuffing and biscuit mixes. Seasoned rice or pasta mixes.  Choose: Unsalted snack items. Low-sodium cereals, oats, puffed wheat and rice, shredded wheat. English muffins and bread. Pasta. Meats  Avoid: Salted, canned, smoked, spiced, pickled meats, including fish and poultry. Bacon, ham, sausage, cold cuts, hot dogs, anchovies.  Choose: Low-sodium canned tuna and salmon. Fresh or frozen meat, poultry, and fish. Dairy  Avoid: Processed cheese and spreads. Cottage cheese. Buttermilk and condensed milk. Regular cheese.  Choose: Milk. Low-sodium cottage cheese. Yogurt. Sour cream. Low-sodium cheese. Fruits and Vegetables  Avoid: Regular canned vegetables. Regular canned tomato sauce and paste. Frozen vegetables in sauces. Olives. Rosita Fire. Relishes. Sauerkraut.  Choose: Low-sodium canned vegetables. Low-sodium  tomato sauce and paste. Frozen or fresh vegetables. Fresh and frozen fruit. Condiments  Avoid: Canned and packaged gravies. Worcestershire sauce. Tartar sauce. Barbecue sauce. Soy sauce. Steak sauce. Ketchup. Onion, garlic, and table salt. Meat flavorings and tenderizers.  Choose: Fresh and dried herbs and spices. Low-sodium varieties of mustard and ketchup. Lemon juice. Tabasco sauce. Horseradish. SAMPLE 2 GRAM SODIUM MEAL PLAN Breakfast / Sodium (mg)  1 cup low-fat milk / 143 mg  2 slices whole-wheat toast / 270 mg  1 tbs  heart-healthy margarine / 153 mg  1 hard-boiled egg / 139 mg  1 small orange / 0 mg Lunch / Sodium (mg)  1 cup raw carrots / 76 mg   cup hummus / 298 mg  1 cup low-fat milk / 143 mg   cup red grapes / 2 mg  1 whole-wheat pita bread / 356 mg Dinner / Sodium (mg)  1 cup whole-wheat pasta / 2 mg  1 cup low-sodium tomato sauce / 73 mg  3 oz lean ground beef / 57 mg  1 small side salad (1 cup raw spinach leaves,  cup cucumber,  cup yellow bell pepper) with 1 tsp olive oil and 1 tsp red wine vinegar / 25 mg Snack / Sodium (mg)  1 container low-fat vanilla yogurt / 107 mg  3 graham cracker squares / 127 mg Nutrient Analysis  Calories: 2033  Protein: 77 g  Carbohydrate: 282 g  Fat: 72 g  Sodium: 1971 mg Document Released: 12/19/2004 Document Revised: 03/13/2011 Document Reviewed: 03/22/2009 [employer] Patient Information 2014 [place], [place].

## 2012-08-07 NOTE — Assessment & Plan Note (Signed)
Patient has chest tightness when she tries to lay down at night. I think this is related to her heart failure. Hopefully this will improve with diuresis.

## 2012-08-07 NOTE — Telephone Encounter (Signed)
Telephone call regarding Pulmonary Rehab referral, she feels like she has so many doctor appointments, she cannot take anything else on at this time.  Cathie Olden RN

## 2012-08-14 ENCOUNTER — Encounter: Payer: Self-pay | Admitting: Neurology

## 2012-08-14 ENCOUNTER — Ambulatory Visit (INDEPENDENT_AMBULATORY_CARE_PROVIDER_SITE_OTHER): Payer: Medicare Other | Admitting: Neurology

## 2012-08-14 ENCOUNTER — Other Ambulatory Visit (INDEPENDENT_AMBULATORY_CARE_PROVIDER_SITE_OTHER): Payer: Medicare Other

## 2012-08-14 VITALS — BP 142/77 | HR 104 | Ht 69.0 in | Wt 152.0 lb

## 2012-08-14 DIAGNOSIS — R930 Abnormal findings on diagnostic imaging of skull and head, not elsewhere classified: Secondary | ICD-10-CM

## 2012-08-14 DIAGNOSIS — I2510 Atherosclerotic heart disease of native coronary artery without angina pectoris: Secondary | ICD-10-CM

## 2012-08-14 DIAGNOSIS — Z7289 Other problems related to lifestyle: Secondary | ICD-10-CM

## 2012-08-14 DIAGNOSIS — R9089 Other abnormal findings on diagnostic imaging of central nervous system: Secondary | ICD-10-CM

## 2012-08-14 DIAGNOSIS — R259 Unspecified abnormal involuntary movements: Secondary | ICD-10-CM

## 2012-08-14 DIAGNOSIS — I509 Heart failure, unspecified: Secondary | ICD-10-CM

## 2012-08-14 DIAGNOSIS — R079 Chest pain, unspecified: Secondary | ICD-10-CM

## 2012-08-14 DIAGNOSIS — I5022 Chronic systolic (congestive) heart failure: Secondary | ICD-10-CM

## 2012-08-14 DIAGNOSIS — F0390 Unspecified dementia without behavioral disturbance: Secondary | ICD-10-CM

## 2012-08-14 DIAGNOSIS — R55 Syncope and collapse: Secondary | ICD-10-CM

## 2012-08-14 DIAGNOSIS — R413 Other amnesia: Secondary | ICD-10-CM

## 2012-08-14 DIAGNOSIS — R0602 Shortness of breath: Secondary | ICD-10-CM

## 2012-08-14 LAB — BASIC METABOLIC PANEL
BUN: 12 mg/dL (ref 6–23)
CO2: 30 mEq/L (ref 19–32)
Chloride: 100 mEq/L (ref 96–112)
Glucose, Bld: 105 mg/dL — ABNORMAL HIGH (ref 70–99)
Potassium: 3.4 mEq/L — ABNORMAL LOW (ref 3.5–5.1)

## 2012-08-14 NOTE — Progress Notes (Signed)
Subjective:    Patient ID: [name] is a 67 y.o. female.  HPI  Interim history:   [name] is a very pleasant 67 year old right-handed woman with an underlying medical history of diabetes, Heart disease, iron deficiency anemia, and depression, who presents for followup consultation of her memory loss of approximately 5 years duration. She is accompanied by her husband again today. I first met them on 05/09/2012, and which time her MMSE was 25/30, clock drawing was 4, animal fluency was 7. She reported being in the hospital in April for syncopal spells and had workup including echocardiogram, angiogram, cardiac stress test. She was found to be in heart block. She was placed on oxygen since her hospitalization and uses it as needed. She uses a CPAP are seen for obstructive sleep apnea and also had sleep apnea surgery in the past. It was recommended that she have a 30 day cardiac monitor done or even a loop monitor. She also reported numbness and tingling in her feet and fingertips for a long time.  At the time of her first visit with me I suggested a brain MRI but did not add any blood work as she had blood work recently. Her brain MRI without contrast from 05/20/2012 was reviewed: Mild perisylvian and severe mesial temporal atrophy. 2. Mild-moderate periventricular and subcortical and pontine chronic small vessel ischemic disease. She had formal neurocognitive testing and her doctors Delton See in early July and I reviewed those results and went over the report with him as well. 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   Of note, her husband has noticed that she is making some involuntary movements, particularly with pursing of her lips. This has been ongoing for the past 2 or 3 months. She has never been treated with any antipsychotics. She has not taking Reglan or Phenergan. She has quit smoking last month. She  reports feeling nervous and jittery at times. Of note she has a remote history of heavy alcohol use mostly in the form of binge drinking she admits. She quit drinking alcohol about 5 years ago. She also has a remote history of cocaine abuse. She quit using that in the 90s she says. She has no overt family history of abnormal involuntary movements or Huntington's disease. She does recall that she had an uncle who had involuntary facial movements and nervousness and could not sit still. She has another aunt she can ask about it and is advised to try to get more information on FHx in general.   Her Past Medical History Is Significant For: Past Medical History  Diagnosis Date  . DM   . DYSLIPIDEMIA   . OBESITY NOS   . DEPRESSION, RECURRENT, IN PARTIAL REMISSION   . HYPERTENSION   . 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 CONGESTIVE HEART FAILURE     EF 40-45% echo 05/2011  . GERD   . DIVERTICULOSIS, COLON, WITH HEMORRHAGE   . BACK PAIN, LUMBAR, WITH RADICULOPATHY   . OBSTRUCTIVE SLEEP APNEA   . Asthma   . Anemia, iron deficiency   . Hyponatremia 12/2011  . Tobacco abuse   . Moderate COPD (chronic obstructive pulmonary disease) 11/07/2005    Spirometry 03/09/11>>FEV1 2.02 (88%), FEV1% 76    . Anxiety   .  Arthritis   . Blood transfusion without reported diagnosis 1970's  . Cataract   . Myocardial infarction 2001    Her Past Surgical History Is Significant For: Past Surgical History  Procedure Laterality Date  . Uvuloplasty    . Total abdominal hysterectomy w/ bilateral salpingoophorectomy    . Abdominal hysterectomy    . Cholecystectomy  2004  . Coronary angioplasty with stent placement  2002    last 04-29-12  . Esophagogastroduodenoscopy (egd) with propofol N/A 06/06/2012    Procedure: ESOPHAGOGASTRODUODENOSCOPY (EGD) WITH PROPOFOL;  Surgeon: Rachael Fee, MD;  Location: [facility];  Service: Endoscopy;  Laterality: N/A;    Her Family History Is Significant For: Family History  Problem Relation Age of Onset  . Stroke Mother   . Breast cancer Sister   . Emphysema Sister   . Cancer Sister     breast  . Alcohol abuse Brother   . Colon cancer      uncles x 2  . Prostate cancer Brother     Her Social History Is Significant For: History   Social History  . Marital Status: Married    Spouse Name: N/A    Number of Children: 1  . Years of Education: 12   Occupational History  . Retired Insurance account manager for [employer]    Social History Main Topics  . Smoking status: Former Smoker -- 0.25 packs/day for 20 years    Types: Cigarettes    Quit date: 07/17/2012  . Smokeless tobacco: Never Used     Comment: Still smokes a little.  . Alcohol Use: No     Comment: Patient drinks cafinated daily.  . Drug Use: No  . Sexual Activity: Not Currently    Birth Control/ Protection: Surgical   Other Topics Concern  . None   Social History Narrative  . None    Her Allergies Are:  Allergies  Allergen Reactions  . Amlodipine Other (See Comments)    constipation  . Ramipril Cough  . Adhesive [Tape] Hives    "peels skin off"  . Penicillins Itching  . Codeine Rash  :   Her Current Medications Are:  Outpatient Encounter Prescriptions as of 08/14/2012  Medication Sig Dispense Refill  . acetaminophen (TYLENOL) 500 MG tablet Take 500 mg by mouth every 6 (six) hours as needed for pain.      [name] albuterol (PROVENTIL) (2.5 MG/3ML) 0.083% nebulizer solution Take 2.5 mg by nebulization every 6 (six) hours as needed for wheezing.      [name] aspirin 81 MG tablet Take 81 mg by mouth daily.        [name] atorvastatin (LIPITOR) 40 MG tablet Take 40 mg by mouth every morning.      . diclofenac sodium (VOLTAREN) 1 % GEL Apply 2 g topically 4 (four) times daily.  2 Tube  2  . feeding supplement (GLUCERNA SHAKE) LIQD Take 237 mLs by mouth every  morning.      . ferrous fumarate (FERRETTS) 325 (106 FE) MG TABS Take 1 tablet (106 mg of iron total) by mouth 2 (two) times daily.  60 tablet  11  . furosemide (LASIX) 80 MG tablet Take 3 tablet in the morning and 3 tablets in the 3 tablets at night for 3 days ,then take 2 tablets in the morning and 1 tablet at night  108 tablet  0  . gabapentin (NEURONTIN) 300 MG capsule Take 1 capsule (300 mg total) by mouth 2 (two) times daily.  60  capsule  5  . hydrALAZINE (APRESOLINE) 50 MG tablet Take 50 mg by mouth every 8 (eight) hours.      . hydrOXYzine (ATARAX/VISTARIL) 10 MG tablet Take 10 mg by mouth 2 (two) times daily.      . isosorbide mononitrate (IMDUR) 60 MG 24 hr tablet Take 60 mg by mouth daily at 12 noon.      . nitroGLYCERIN (NITROSTAT) 0.4 MG SL tablet Place 1 tablet (0.4 mg total) under the tongue every 5 (five) minutes x 3 doses as needed for chest pain.  25 tablet  3  . olmesartan (BENICAR) 40 MG tablet Take 1 tablet (40 mg total) by mouth every morning.  30 tablet  11  . omeprazole (PRILOSEC) 40 MG capsule Take 40 mg by mouth daily.      . polyethylene glycol (MIRALAX / GLYCOLAX) packet Take 17 g by mouth daily as needed (for constipation).      . potassium chloride (MICRO-K) 10 MEQ CR capsule Take 3 tablets in the morning and 3 tablets at night for 3 days, then take 3 tablets in the morning and 2 tablets at night.  138 capsule  0  . sitaGLIPtan-metformin (JANUMET) 50-500 MG per tablet Take 1 tablet by mouth 2 (two) times daily with a meal.      . tiotropium (SPIRIVA) 18 MCG inhalation capsule Place 18 mcg into inhaler and inhale daily.      . traZODone (DESYREL) 100 MG tablet Take 150 mg by mouth at bedtime.      [name] venlafaxine XR (EFFEXOR-XR) 150 MG 24 hr capsule Take 150 mg by mouth every morning.      . [DISCONTINUED] pantoprazole (PROTONIX) 40 MG tablet Take 40 mg by mouth 2 (two) times daily.       No facility-administered encounter medications on file as of 08/14/2012.    Review  of Systems  Constitutional: Positive for appetite change, fatigue and unexpected weight change (weight loss).  Respiratory: Positive for shortness of breath.   Cardiovascular: Positive for chest pain.  Gastrointestinal: Positive for constipation.       Incontinence  Genitourinary:       Incontinence  Musculoskeletal: Positive for joint swelling and arthralgias.       Cramps  Skin:       itching  Neurological: Positive for tremors, weakness and numbness.       Memory loss  Psychiatric/Behavioral: Positive for dysphoric mood. The patient is nervous/anxious.     Objective:  Neurologic Exam  Physical Exam Physical Examination:   Filed Vitals:   08/14/12 1123  BP: 142/77  Pulse: 104    General Examination: The patient is a pleasant 67 y.o. female in no acute distress. She is calm and cooperative with the exam. She denies Auditory Hallucinations, Visual Hallucinations and Tactile Hallucinations. She does appear a little restless today. While she does not have any choreiform movements in the trunk or extremities she does have involuntary facial grimacing particularly lip pursing and some minor facial grimacing, she is not aware of it, she states.  HEENT: Normocephalic, atraumatic, pupils are equal, round and reactive to light and accommodation. Extraocular tracking shows mild saccadic breakdown without nystagmus noted. Hearing is intact. Tympanic membranes are clear bilaterally. Face is symmetric with no  facial masking and normal facial sensation. There is no lip, neck or jaw tremor. Neck is Not rigid with intact passive ROM. There are no carotid bruits on auscultation. Oropharynx exam reveals moderate mouth dryness. No significant airway crowding  is noted, Uvula and tonsils are absent. Mallampati is class II. Tongue protrudes centrally and palate elevates symmetrically.    Chest: is clear to auscultation without wheezing, rhonchi or crackles noted.  Heart: sounds are regular and normal  without murmurs, rubs or gallops noted.   Abdomen: is soft, non-tender and non-distended with normal bowel sounds appreciated on auscultation.  Extremities: There is no pitting edema in the distal lower extremities bilaterally. Pedal pulses are intact.  Skin: is warm and dry with no trophic changes noted.  Musculoskeletal: exam reveals no obvious joint deformities, tenderness or joint swelling or erythema.  Neurologically:  Mental status: The patient is awake and alert, paying good  attention. She is able to provide the history. Her husband  provides details. She is oriented to: person, place, time/date, situation, day of week, month of year and year. Her memory, attention, language and knowledge are impaired mildly. There is no aphasia, agnosia, apraxia or anomia. There is no bradyphrenia. Speech is mildly hypophonic with no  dysarthria noted. Mood is congruent and affect is normal.   Her MMSE score from 05/09/12 was 25/30, CDT was 4/4, AFT (Animal Fluency Test) score was 7.   Cranial nerves are as described above under HEENT exam. In addition, shoulder shrug is normal with equal shoulder height noted.  Motor exam: Normal bulk, and strength for age is noted. Tone is not rigid with absence of cogwheeling in the extremities. There is overall mild bradykinesia. There is no drift or rebound. There is no tremor. Romberg is negative. Reflexes are 1+ in the upper extremities and 1+ in the lower extremities, in the knees and absent in the ankles. Toes are downgoing bilaterally. Fine motor skills: Finger taps, hand movements, and rapid alternating patting are mildly impaired bilaterally. Foot taps and foot agility are moderately impaired bilaterally.   Cerebellar testing shows no dysmetria or intention tremor on finger to nose testing. Heel to shin is unremarkable. There is no truncal or gait ataxia.   Sensory exam is intact to light touch, pinprick, vibration, temperature sense and proprioception in the  upper extremities, with decrease in pinprick, temperature and vibration sense in the distal lower extremities bilaterally.   Gait, station and balance: She stands up from the seated position with mild difficulty and needs mild assistance. No veering to one side is noted. No leaning to one side. Posture is mildly stooped, age appropriate. Stance is wide-based. She turns in 3 steps. Tandem walk is not possible. Balance is mildly impaired.     Assessment and Plan:   In summary, [name] is a very pleasant 67 y.o.-year old female with a complex medical history of multiple vascular risk factors who has been experiencing memory loss for the past few years, perhaps as long as 5 years. Her history and exam are concerning for early dementia and her recent neuropsychological evaluation was in keeping with early dementia. I noticed some abnormal involuntary movements today and the husband reports noticing these for the past 2 or 3 months. At this juncture I would like to add some more blood work and I would be interested in knowing more about her family history in particular since she reports one uncle had abnormal facial movements and was really restless. Of note she does have a family history of alcoholism. She is advised to continue with good lifestyle choices. She is commended on smoking cessation and she stopped drinking alcohol altogether about 5 years ago. She does not currently use any illicit drugs.  She is advised to followup with me in a few months. In the interim we will call her with her blood test results. She and her husband were in agreement. She understands that the main treatment route for vascular dementia is in the form of prevention, including good blood glucose control, blood pressure control, cholesterol management, diet and exercise.

## 2012-08-14 NOTE — Patient Instructions (Signed)
I think overall you are doing fairly well but I do want to suggest a few things today:  Remember to drink plenty of fluid, eat healthy meals and do not skip any meals. Try to eat protein with a every meal and eat a healthy snack such as fruit or nuts in between meals. Try to keep a regular sleep-wake schedule and try to exercise daily, particularly in the form of walking, 20-30 minutes a day, if you can.   Engage in social activities in your community and with your family and try to keep up with current events by reading the newspaper or watching the news.   As far as your medications are concerned, I would like to suggest no changes.    As far as diagnostic testing: blood work.   I would like to see you back in 6 months, sooner if we need to. Please call us with any interim questions, concerns, problems, updates or refill requests.  Please also call us for any test results so we can go over those with you on the phone. Brett Canales is my clinical assistant and will answer any of your questions and relay your messages to me and also relay most of my messages to you.  Our phone number is [phone]. We also have an after hours call service for urgent matters and there is a physician on-call for urgent questions. For any emergencies you know to call 911 or go to the nearest emergency room.

## 2012-08-16 ENCOUNTER — Other Ambulatory Visit: Payer: Self-pay | Admitting: Neurology

## 2012-08-20 ENCOUNTER — Telehealth: Payer: Self-pay | Admitting: Neurology

## 2012-08-20 LAB — CERULOPLASMIN: Ceruloplasmin: 31.9 mg/dL (ref 16.0–45.0)

## 2012-08-20 LAB — IFE AND PE, SERUM
Alpha 1: 0.3 g/dL (ref 0.1–0.4)
Alpha2 Glob SerPl Elph-Mcnc: 0.9 g/dL (ref 0.4–1.2)
IgA/Immunoglobulin A, Serum: 291 mg/dL (ref 91–414)
IgM (Immunoglobulin M), Srm: <6 mg/dL — ABNORMAL LOW (ref 40–230)
Total Protein: 6.2 g/dL (ref 6.0–8.5)

## 2012-08-20 LAB — ANA W/REFLEX: Anti Nuclear Antibody(ANA): NEGATIVE

## 2012-08-20 LAB — COPPER, SERUM: Copper: 152 ug/dL (ref 72–166)

## 2012-08-20 NOTE — Progress Notes (Signed)
Quick Note:  Please advise patient or her husband that her blood work for the most part was unremarkable. The only thing that came back elevated was an inflammatory marker called C-reactive protein. This is a fairly nonspecific inflammatory marker and can be elevated from gastritis, inflammatory arthritis, irritable bowel syndrome, chronic back pain. In isolation it does not tell us where the infection or inflammation is. Unless she is having an acute problem such as an acute infection or urinary tract infection or cough or cold symptoms we may have to just watch this. Please inquire. thx Huston Foley, MD, PhD [facility] ([facility]  ______

## 2012-08-20 NOTE — Telephone Encounter (Signed)
I called and spoke to pt and let her know of lab results. Labs unremarkable except  CRP elevated (nonspecific for inflammation).  She relayed that she thinks she has pus in urine or bowel.  ??  She has appt with CD and pcp next 2 days and she will address with them.

## 2012-08-21 ENCOUNTER — Encounter: Payer: Self-pay | Admitting: Physician Assistant

## 2012-08-21 ENCOUNTER — Ambulatory Visit (INDEPENDENT_AMBULATORY_CARE_PROVIDER_SITE_OTHER): Payer: Medicare Other | Admitting: Physician Assistant

## 2012-08-21 ENCOUNTER — Telehealth: Payer: Self-pay

## 2012-08-21 VITALS — BP 132/78 | HR 90 | Ht 69.0 in | Wt 154.0 lb

## 2012-08-21 DIAGNOSIS — I5023 Acute on chronic systolic (congestive) heart failure: Secondary | ICD-10-CM

## 2012-08-21 DIAGNOSIS — I509 Heart failure, unspecified: Secondary | ICD-10-CM

## 2012-08-21 DIAGNOSIS — I10 Essential (primary) hypertension: Secondary | ICD-10-CM

## 2012-08-21 DIAGNOSIS — I2510 Atherosclerotic heart disease of native coronary artery without angina pectoris: Secondary | ICD-10-CM

## 2012-08-21 LAB — URINALYSIS, ROUTINE W REFLEX MICROSCOPIC
Bilirubin Urine: NEGATIVE
Hgb urine dipstick: NEGATIVE
Nitrite: NEGATIVE
Total Protein, Urine: NEGATIVE
Urine Glucose: NEGATIVE
pH: 6 (ref 5.0–8.0)

## 2012-08-21 MED ORDER — POTASSIUM CHLORIDE ER 10 MEQ PO CPCR: ORAL_CAPSULE | ORAL | Status: DC

## 2012-08-21 MED ORDER — FUROSEMIDE 80 MG PO TABS: ORAL_TABLET | ORAL | Status: DC

## 2012-08-21 NOTE — Assessment & Plan Note (Signed)
Blood pressure stable ? ?

## 2012-08-21 NOTE — Telephone Encounter (Signed)
Received paperwork from [employer] for a back brace.  I left a message on patients answering machine to return my call to see if patient really wants this?

## 2012-08-21 NOTE — Patient Instructions (Addendum)
Your physician has recommended you make the following change in your medication:   INCREASE LASIX TO 1.5 TABLETS TWICE A DAY INCREASE POTASSIUM 3 TABLETS TWICE A DAY  Your physician recommends that you return for lab work in: BMET NEXT WEEK  Your physician recommends that you return for lab work in: URINE TEST TODAY   Your physician recommends that you schedule a follow-up appointment in: 2 WEEKS WITH MICHELLE LENZE

## 2012-08-21 NOTE — Assessment & Plan Note (Signed)
Patient still has acute on chronic heart failure. She felt better on the higher dose Lasix for the 3 days she was taking it but inadvertently was not taking the higher dose I had prescribed. Will increase her Lasix and potassium once again in hopes of getting her relief. I will see her back in 2 weeks. We will recheck labs as well.

## 2012-08-21 NOTE — Telephone Encounter (Signed)
Patient states that she did request this. Paperwork on MD's desk

## 2012-08-21 NOTE — Progress Notes (Signed)
HPI:  This is a 67 year old female patient of Dr.Hochrein who I saw 2 weeks ago with acute on chronic heart failure. I increased her Lasix to 120 mg twice a day for 3 days and then she was to take 120 mg in the morning and 80 in the evening. She felt pretty good for a couple days when we increased it but then she incidentally went back to taking 80 mg twice a day rather than the 120 mg in the morning. Her weight is now the same as it was and she continues to have dyspnea on exertion and orthopnea. She claims to be watching her sodium intake closely.  She has a history of syncope felt related to bradycardia from beta blockers which were discontinued 2-D echo showed EF of 45% by Myoview showed an EF of 14%. Cardiac catheterization gauge the ejection fraction at 20%. She also had 20% left main, distal LAD was very small and diseased with apical 99% stenosis unchanged, circumflex had moderate plaque. There was a stent in the RCA that was patent. The distal stent had some mild restenosis. There was some distal PDA 99% stenosis but this was very small caliber vessel.  The patient also feels like she is not urinating frequently enough and would like a urinalysis done.  Allergies  -- Amlodipine -- Other (See Comments)   --  constipation  -- Ramipril -- Cough  -- Adhesive [Tape] -- Hives   --  "peels skin off"  -- Penicillins -- Itching  -- Codeine -- Rash  Current Outpatient Prescriptions on File Prior to Visit: acetaminophen (TYLENOL) 500 MG tablet, Take 500 mg by mouth every 6 (six) hours as needed for pain., Disp: , Rfl:  albuterol (PROVENTIL) (2.5 MG/3ML) 0.083% nebulizer solution, Take 2.5 mg by nebulization every 6 (six) hours as needed for wheezing., Disp: , Rfl:  aspirin 81 MG tablet, Take 81 mg by mouth daily.  , Disp: , Rfl:  atorvastatin (LIPITOR) 40 MG tablet, Take 40 mg by mouth every morning., Disp: , Rfl:  diclofenac sodium (VOLTAREN) 1 % GEL, Apply 2 g topically 4 (four) times daily.,  Disp: 2 Tube, Rfl: 2 feeding supplement (GLUCERNA SHAKE) LIQD, Take 237 mLs by mouth every morning., Disp: , Rfl:  ferrous fumarate (FERRETTS) 325 (106 FE) MG TABS, Take 1 tablet (106 mg of iron total) by mouth 2 (two) times daily., Disp: 60 tablet, Rfl: 11 furosemide (LASIX) 80 MG tablet, Take 3 tablet in the morning and 3 tablets in the 3 tablets at night for 3 days ,then take 2 tablets in the morning and 1 tablet at night, Disp: 108 tablet, Rfl: 0 gabapentin (NEURONTIN) 300 MG capsule, Take 1 capsule (300 mg total) by mouth 2 (two) times daily., Disp: 60 capsule, Rfl: 5 hydrALAZINE (APRESOLINE) 50 MG tablet, Take 50 mg by mouth every 8 (eight) hours., Disp: , Rfl:  hydrOXYzine (ATARAX/VISTARIL) 10 MG tablet, Take 10 mg by mouth 2 (two) times daily., Disp: , Rfl:  isosorbide mononitrate (IMDUR) 60 MG 24 hr tablet, Take 60 mg by mouth daily at 12 noon., Disp: , Rfl:  nitroGLYCERIN (NITROSTAT) 0.4 MG SL tablet, Place 1 tablet (0.4 mg total) under the tongue every 5 (five) minutes x 3 doses as needed for chest pain., Disp: 25 tablet, Rfl: 3 olmesartan (BENICAR) 40 MG tablet, Take 1 tablet (40 mg total) by mouth every morning., Disp: 30 tablet, Rfl: 11 omeprazole (PRILOSEC) 40 MG capsule, Take 40 mg by mouth daily., Disp: , Rfl:  polyethylene  glycol (MIRALAX / GLYCOLAX) packet, Take 17 g by mouth daily as needed (for constipation)., Disp: , Rfl:  potassium chloride (MICRO-K) 10 MEQ CR capsule, Take 3 tablets in the morning and 3 tablets at night for 3 days, then take 3 tablets in the morning and 2 tablets at night., Disp: 138 capsule, Rfl: 0 sitaGLIPtan-metformin (JANUMET) 50-500 MG per tablet, Take 1 tablet by mouth 2 (two) times daily with a meal., Disp: , Rfl:  tiotropium (SPIRIVA) 18 MCG inhalation capsule, Place 18 mcg into inhaler and inhale daily., Disp: , Rfl:  traZODone (DESYREL) 100 MG tablet, Take 150 mg by mouth at bedtime., Disp: , Rfl:  venlafaxine XR (EFFEXOR-XR) 150 MG 24 hr capsule,  Take 150 mg by mouth every morning., Disp: , Rfl:  [DISCONTINUED] budesonide-formoterol (SYMBICORT) 80-4.5 MCG/ACT inhaler, Inhale 2 puffs into the lungs 2 (two) times daily., Disp: 1 Inhaler, Rfl: 12  No current facility-administered medications on file prior to visit.   Past Medical History:   DM                                                           DYSLIPIDEMIA                                                 OBESITY NOS                                                  DEPRESSION, RECURRENT, IN PARTIAL REMISSION                  HYPERTENSION                                                 CORONARY ARTERY DISEASE                                        Comment:S/p Q wave MI 2002 with RCA stent and AngioJet               Cath 4/30 99% distal LAD stenosis unchanged               from previous, 20% left main stenosis, patent               stents   CONGESTIVE HEART FAILURE                                       Comment:EF 40-45% echo 05/2011   GERD  DIVERTICULOSIS, COLON, WITH HEMORRHAGE                       BACK PAIN, LUMBAR, WITH RADICULOPATHY                        OBSTRUCTIVE SLEEP APNEA                                      Asthma                                                       Anemia, iron deficiency                                      Hyponatremia                                    12/2011      Tobacco abuse                                                Moderate COPD (chronic obstructive pulmonary d* 11/07/2005      Comment:Spirometry 03/09/11>>FEV1 2.02 (88%), FEV1% 76    Anxiety                                                      Arthritis                                                    Blood transfusion without reported diagnosis    1970's       Cataract                                                     Myocardial infarction                           2001        Past Surgical History:   uvuloplasty                                                    TOTAL ABDOMINAL HYSTERECTOMY W/ BILATERAL SALP*               ABDOMINAL HYSTERECTOMY  CHOLECYSTECTOMY                                  2004         CORONARY ANGIOPLASTY WITH STENT PLACEMENT        2002           Comment:last 04-29-12   ESOPHAGOGASTRODUODENOSCOPY (EGD) WITH PROPOFOL  N/A 06/06/2012       Comment:Procedure: ESOPHAGOGASTRODUODENOSCOPY (EGD)               WITH PROPOFOL;  Surgeon: Rachael Fee, MD;                Location: [facility];  Service: Endoscopy;                Laterality: N/A;  Review of patient's family history indicates:   Stroke                         Mother                   Breast cancer                  Sister                   Emphysema                      Sister                   Cancer                         Sister                     Comment: breast   Alcohol abuse                  Brother                  Colon cancer                                              Comment: uncles x 2   Prostate cancer                Brother                  Social History   Marital Status: Married             Spouse Name:                      Years of Education: 12              Number of children: 1           Occupational History Occupation          Associate Professor            Comment              Retired Engineer, structural*                       Social History Main Topics   Smoking Status: Former Smoker  Packs/Day: 0.25  Years: 20        Types: Cigarettes     Quit date: 07/17/2012   Smokeless Status: Never Used                       Comment: Still smokes a little.   Alcohol Use: No                Comment: Patient drinks cafinated daily.   Drug Use: No             Sexual Activity: Not Currently          Birth Control/Protection: Surgical  Other Topics            Concern   None on file  Social History Narrative   None on file    ROS: See history of present illness  otherwise negative   PHYSICAL EXAM: Well-nournished, in no acute distress. Neck: Increased JVD, no HJR, Bruit, or thyroid enlargement  Lungs: No tachypnea, clear without wheezing, rales, or rhonchi  Cardiovascular: RRR, PMI not displaced, positive S4 and 1/6 systolic murmur at the left sternal border, no bruit, thrill, or heave.  Abdomen: BS normal. Soft without organomegaly, masses, lesions or tenderness.  Extremities: +1 edema bilaterally up her knees otherwise lower extremities without cyanosis, clubbing. Good distal pulses bilateral  SKin: Warm, no lesions or rashes   Musculoskeletal: No deformities  Neuro: no focal signs  BP 132/78  Pulse 90  Ht 5\' 9"  (1.753 m)  Wt 154 lb (69.854 kg)  BMI 22.73 kg/m2  SpO2 90%

## 2012-08-21 NOTE — Assessment & Plan Note (Signed)
Stable without chest pain 

## 2012-08-22 ENCOUNTER — Ambulatory Visit (INDEPENDENT_AMBULATORY_CARE_PROVIDER_SITE_OTHER): Payer: Medicare Other | Admitting: Internal Medicine

## 2012-08-22 ENCOUNTER — Encounter: Payer: Self-pay | Admitting: Internal Medicine

## 2012-08-22 ENCOUNTER — Other Ambulatory Visit: Payer: Self-pay | Admitting: Physician Assistant

## 2012-08-22 ENCOUNTER — Other Ambulatory Visit (INDEPENDENT_AMBULATORY_CARE_PROVIDER_SITE_OTHER): Payer: Medicare Other

## 2012-08-22 ENCOUNTER — Other Ambulatory Visit: Payer: Self-pay

## 2012-08-22 VITALS — BP 132/84 | HR 96 | Temp 98.3°F | Resp 16 | Wt 151.0 lb

## 2012-08-22 DIAGNOSIS — E785 Hyperlipidemia, unspecified: Secondary | ICD-10-CM

## 2012-08-22 DIAGNOSIS — I10 Essential (primary) hypertension: Secondary | ICD-10-CM

## 2012-08-22 DIAGNOSIS — M545 Low back pain: Secondary | ICD-10-CM

## 2012-08-22 DIAGNOSIS — Z1231 Encounter for screening mammogram for malignant neoplasm of breast: Secondary | ICD-10-CM

## 2012-08-22 DIAGNOSIS — Z23 Encounter for immunization: Secondary | ICD-10-CM

## 2012-08-22 LAB — BASIC METABOLIC PANEL
CO2: 30 mEq/L (ref 19–32)
Calcium: 9.2 mg/dL (ref 8.4–10.5)
Chloride: 102 mEq/L (ref 96–112)
Glucose, Bld: 130 mg/dL — ABNORMAL HIGH (ref 70–99)
Sodium: 138 mEq/L (ref 135–145)

## 2012-08-22 LAB — LIPID PANEL
Cholesterol: 125 mg/dL (ref 0–200)
VLDL: 13 mg/dL (ref 0.0–40.0)

## 2012-08-22 LAB — CK: Total CK: 65 U/L (ref 7–177)

## 2012-08-22 NOTE — Progress Notes (Signed)
Subjective:    Patient ID: [name], female    DOB: 1945/01/14, 67 y.o.   MRN: [id number]  Diabetes She presents for her follow-up diabetic visit. She has type 2 diabetes mellitus. Her disease course has been stable. Hypoglycemia symptoms include confusion. Pertinent negatives for hypoglycemia include no dizziness or nervousness/anxiousness. Associated symptoms include fatigue, foot paresthesias and weight loss. Pertinent negatives for diabetes include no blurred vision, no chest pain, no foot ulcerations, no polydipsia, no polyphagia, no polyuria and no weakness. There are no hypoglycemic complications. Diabetic complications include heart disease, nephropathy and peripheral neuropathy. Current diabetic treatment includes oral agent (dual therapy). Her weight is decreasing steadily. She is following a generally healthy diet. She has not had a previous visit with a dietician. She never participates in exercise. There is no change in her home blood glucose trend. An ACE inhibitor/angiotensin II receptor blocker is being taken. She does not see a podiatrist.Eye exam is current.      Review of Systems  Constitutional: Positive for weight loss and fatigue.  HENT: Negative.  Negative for sore throat and trouble swallowing.   Eyes: Negative.  Negative for blurred vision.  Respiratory: Positive for shortness of breath. Negative for apnea, cough, choking, chest tightness, wheezing and stridor.   Cardiovascular: Negative.  Negative for chest pain, palpitations and leg swelling.  Gastrointestinal: Negative.  Negative for nausea, vomiting, abdominal pain, diarrhea and constipation.  Endocrine: Negative.  Negative for polydipsia, polyphagia and polyuria.  Genitourinary: Negative.   Musculoskeletal: Positive for back pain. Negative for myalgias, joint swelling and gait problem.  Skin: Negative.   Allergic/Immunologic: Negative.   Neurological: Negative.  Negative for dizziness, weakness,  light-headedness and numbness.  Hematological: Negative.  Negative for adenopathy. Does not bruise/bleed easily.  Psychiatric/Behavioral: Positive for confusion and decreased concentration. Negative for suicidal ideas, hallucinations, behavioral problems, sleep disturbance, self-injury and agitation. The patient is not nervous/anxious and is not hyperactive.        Objective:   Physical Exam  Vitals reviewed. Constitutional: She is oriented to person, place, and time. She appears well-developed and well-nourished. No distress.  HENT:  Head: Normocephalic and atraumatic.  Mouth/Throat: Oropharynx is clear and moist. No oropharyngeal exudate.  Eyes: Conjunctivae are normal. Right eye exhibits no discharge. Left eye exhibits no discharge. No scleral icterus.  Neck: Normal range of motion. Neck supple. No JVD present. No tracheal deviation present. No thyromegaly present.  Cardiovascular: Normal rate, regular rhythm, normal heart sounds and intact distal pulses.  Exam reveals no gallop and no friction rub.   No murmur heard. Pulmonary/Chest: Effort normal and breath sounds normal. No stridor. No respiratory distress. She has no wheezes. She has no rales. She exhibits no tenderness.  Abdominal: Soft. Bowel sounds are normal. She exhibits no distension and no mass. There is no tenderness. There is no rebound and no guarding.  Musculoskeletal: Normal range of motion. She exhibits no edema and no tenderness.  Lymphadenopathy:    She has no cervical adenopathy.  Neurological: She is oriented to person, place, and time.  Skin: Skin is warm and dry. No rash noted. She is not diaphoretic. No erythema. No pallor.  Psychiatric: Judgment and thought content normal. Her mood appears not anxious. Her affect is not angry, not blunt, not labile and not inappropriate. Her speech is delayed and tangential. Her speech is not rapid and/or pressured. She is slowed. She is not withdrawn and not actively  hallucinating. She does not exhibit a depressed mood. She is  inattentive.      Lab Results  Component Value Date   WBC 5.0 06/17/2012   HGB 11.6 06/17/2012   HCT 33.7* 06/17/2012   PLT 254 06/17/2012   GLUCOSE 105* 08/14/2012   CHOL 120 12/07/2011   TRIG 83 12/07/2011   HDL 27* 12/07/2011   LDLCALC 76 12/07/2011   ALT 7 04/16/2012   AST 13 04/16/2012   NA 137 08/14/2012   K 3.4* 08/14/2012   CL 100 08/14/2012   CREATININE 1.1 08/14/2012   BUN 12 08/14/2012   CO2 30 08/14/2012   TSH 2.357 04/23/2012   INR 1.00 04/28/2012   HGBA1C 6.3 03/25/2012   MICROALBUR 2.45* 03/15/2007      Assessment & Plan:

## 2012-08-22 NOTE — Patient Instructions (Signed)
Type 2 Diabetes Mellitus, Adult Type 2 diabetes mellitus, often simply referred to as type 2 diabetes, is a long-lasting (chronic) disease. In type 2 diabetes, the pancreas does not make enough insulin (a hormone), the cells are less responsive to the insulin that is made (insulin resistance), or both. Normally, insulin moves sugars from food into the tissue cells. The tissue cells use the sugars for energy. The lack of insulin or the lack of normal response to insulin causes excess sugars to build up in the blood instead of going into the tissue cells. As a result, high blood sugar (hyperglycemia) develops. The effect of high sugar (glucose) levels can cause many complications. Type 2 diabetes was also previously called adult-onset diabetes but it can occur at any age.  RISK FACTORS  A person is predisposed to developing type 2 diabetes if someone in the family has the disease and also has one or more of the following primary risk factors:  Overweight.  An inactive lifestyle.  A history of consistently eating high-calorie foods. Maintaining a normal weight and regular physical activity can reduce the chance of developing type 2 diabetes. SYMPTOMS  A person with type 2 diabetes may not show symptoms initially. The symptoms of type 2 diabetes appear slowly. The symptoms include:  Increased thirst (polydipsia).  Increased urination (polyuria).  Increased urination during the night (nocturia).  Weight loss. This weight loss may be rapid.  Frequent, recurring infections.  Tiredness (fatigue).  Weakness.  Vision changes, such as blurred vision.  Fruity smell to your breath.  Abdominal pain.  Nausea or vomiting.  Cuts or bruises which are slow to heal.  Tingling or numbness in the hands or feet. DIAGNOSIS Type 2 diabetes is frequently not diagnosed until complications of diabetes are present. Type 2 diabetes is diagnosed when symptoms or complications are present and when blood  glucose levels are increased. Your blood glucose level may be checked by one or more of the following blood tests:  A fasting blood glucose test. You will not be allowed to eat for at least 8 hours before a blood sample is taken.  A random blood glucose test. Your blood glucose is checked at any time of the day regardless of when you ate.  A hemoglobin A1c blood glucose test. A hemoglobin A1c test provides information about blood glucose control over the previous 3 months.  An oral glucose tolerance test (OGTT). Your blood glucose is measured after you have not eaten (fasted) for 2 hours and then after you drink a glucose-containing beverage. TREATMENT   You may need to take insulin or diabetes medicine daily to keep blood glucose levels in the desired range.  You will need to match insulin dosing with exercise and healthy food choices. The treatment goal is to maintain the before meal blood sugar (preprandial glucose) level at 70 130 mg/dL. HOME CARE INSTRUCTIONS   Have your hemoglobin A1c level checked twice a year.  Perform daily blood glucose monitoring as directed by your caregiver.  Monitor urine ketones when you are ill and as directed by your caregiver.  Take your diabetes medicine or insulin as directed by your caregiver to maintain your blood glucose levels in the desired range.  Never run out of diabetes medicine or insulin. It is needed every day.  Adjust insulin based on your intake of carbohydrates. Carbohydrates can raise blood glucose levels but need to be included in your diet. Carbohydrates provide vitamins, minerals, and fiber which are an essential part of   a healthy diet. Carbohydrates are found in fruits, vegetables, whole grains, dairy products, legumes, and foods containing added sugars.    Eat healthy foods. Alternate 3 meals with 3 snacks.  Lose weight if overweight.  Carry a medical alert card or wear your medical alert jewelry.  Carry a 15 gram  carbohydrate snack with you at all times to treat low blood glucose (hypoglycemia). Some examples of 15 gram carbohydrate snacks include:  Glucose tablets, 3 or 4   Glucose gel, 15 gram tube  Raisins, 2 tablespoons (24 grams)  Jelly beans, 6  Animal crackers, 8  Regular pop, 4 ounces (120 mL)  Gummy treats, 9  Recognize hypoglycemia. Hypoglycemia occurs with blood glucose levels of 70 mg/dL and below. The risk for hypoglycemia increases when fasting or skipping meals, during or after intense exercise, and during sleep. Hypoglycemia symptoms can include:  Tremors or shakes.  Decreased ability to concentrate.  Sweating.  Increased heart rate.  Headache.  Dry mouth.  Hunger.  Irritability.  Anxiety.  Restless sleep.  Altered speech or coordination.  Confusion.  Treat hypoglycemia promptly. If you are alert and able to safely swallow, follow the 15:15 rule:  Take 15 20 grams of rapid-acting glucose or carbohydrate. Rapid-acting options include glucose gel, glucose tablets, or 4 ounces (120 mL) of fruit juice, regular soda, or low fat milk.  Check your blood glucose level 15 minutes after taking the glucose.  Take 15 20 grams more of glucose if the repeat blood glucose level is still 70 mg/dL or below.  Eat a meal or snack within 1 hour once blood glucose levels return to normal.    Be alert to polyuria and polydipsia which are early signs of hyperglycemia. An early awareness of hyperglycemia allows for prompt treatment. Treat hyperglycemia as directed by your caregiver.  Engage in at least 150 minutes of moderate-intensity physical activity a week, spread over at least 3 days of the week or as directed by your caregiver. In addition, you should engage in resistance exercise at least 2 times a week or as directed by your caregiver.  Adjust your medicine and food intake as needed if you start a new exercise or sport.  Follow your sick day plan at any time you  are unable to eat or drink as usual.  Avoid tobacco use.  Limit alcohol intake to no more than 1 drink per day for nonpregnant women and 2 drinks per day for men. You should drink alcohol only when you are also eating food. Talk with your caregiver whether alcohol is safe for you. Tell your caregiver if you drink alcohol several times a week.  Follow up with your caregiver regularly.  Schedule an eye exam soon after the diagnosis of type 2 diabetes and then annually.  Perform daily skin and foot care. Examine your skin and feet daily for cuts, bruises, redness, nail problems, bleeding, blisters, or sores. A foot exam by a caregiver should be done annually.  Brush your teeth and gums at least twice a day and floss at least once a day. Follow up with your dentist regularly.  Share your diabetes management plan with your workplace or school.  Stay up-to-date with immunizations.  Learn to manage stress.  Obtain ongoing diabetes education and support as needed.  Participate in, or seek rehabilitation as needed to maintain or improve independence and quality of life. Request a physical or occupational therapy referral if you are having foot or hand numbness or difficulties with grooming,   dressing, eating, or physical activity. SEEK MEDICAL CARE IF:   You are unable to eat food or drink fluids for more than 6 hours.  You have nausea and vomiting for more than 6 hours.  Your blood glucose level is over 240 mg/dL.  There is a change in mental status.  You develop an additional serious illness.  You have diarrhea for more than 6 hours.  You have been sick or have had a fever for a couple of days and are not getting better.  You have pain during any physical activity.  SEEK IMMEDIATE MEDICAL CARE IF:  You have difficulty breathing.  You have moderate to large ketone levels. MAKE SURE YOU:  Understand these instructions.  Will watch your condition.  Will get help right away if  you are not doing well or get worse. Document Released: 12/19/2004 Document Revised: 09/13/2011 Document Reviewed: 07/18/2011 [employer] Patient Information 2014 [employer].  

## 2012-08-23 ENCOUNTER — Telehealth: Payer: Self-pay | Admitting: *Deleted

## 2012-08-23 NOTE — Assessment & Plan Note (Signed)
Her A1C is stable. She will continue the current meds.

## 2012-08-23 NOTE — Assessment & Plan Note (Signed)
Goal achieved She is doing well on atorvastatin

## 2012-08-23 NOTE — Assessment & Plan Note (Signed)
Her BP is well controlled 

## 2012-08-23 NOTE — Telephone Encounter (Signed)
Sherri from [facility] called needing a different diagnosis code re:  Labs.  I called and gave her the list of diagnosis codes re: pt when she was in to see Dr. Frances Furbish.  ( 793.0, 780.93, 781.0, 294.20, V69.8).

## 2012-08-28 ENCOUNTER — Telehealth: Payer: Self-pay

## 2012-08-28 ENCOUNTER — Other Ambulatory Visit: Payer: Medicare Other

## 2012-08-28 ENCOUNTER — Other Ambulatory Visit (INDEPENDENT_AMBULATORY_CARE_PROVIDER_SITE_OTHER): Payer: Medicare Other

## 2012-08-28 DIAGNOSIS — I509 Heart failure, unspecified: Secondary | ICD-10-CM

## 2012-08-28 DIAGNOSIS — I5023 Acute on chronic systolic (congestive) heart failure: Secondary | ICD-10-CM

## 2012-08-28 DIAGNOSIS — I10 Essential (primary) hypertension: Secondary | ICD-10-CM

## 2012-08-28 DIAGNOSIS — I2510 Atherosclerotic heart disease of native coronary artery without angina pectoris: Secondary | ICD-10-CM

## 2012-08-28 LAB — BASIC METABOLIC PANEL
BUN: 11 mg/dL (ref 6–23)
CO2: 31 mEq/L (ref 19–32)
Chloride: 99 mEq/L (ref 96–112)
Glucose, Bld: 101 mg/dL — ABNORMAL HIGH (ref 70–99)
Potassium: 4.3 mEq/L (ref 3.5–5.1)
Sodium: 136 mEq/L (ref 135–145)

## 2012-08-28 NOTE — Telephone Encounter (Signed)
Called patient to review recent labs and patient verbalized understanding of results.

## 2012-08-28 NOTE — Telephone Encounter (Signed)
Message copied by Charline Bills on Wed Aug 28, 2012  4:50 PM ------      Message from: Prescott Gum      Created: Wed Aug 28, 2012  2:52 PM       Labs ok ------

## 2012-09-04 ENCOUNTER — Ambulatory Visit (INDEPENDENT_AMBULATORY_CARE_PROVIDER_SITE_OTHER): Payer: Medicare Other | Admitting: Gastroenterology

## 2012-09-04 ENCOUNTER — Encounter: Payer: Self-pay | Admitting: Gastroenterology

## 2012-09-04 ENCOUNTER — Ambulatory Visit (INDEPENDENT_AMBULATORY_CARE_PROVIDER_SITE_OTHER): Payer: Medicare Other | Admitting: Physician Assistant

## 2012-09-04 ENCOUNTER — Encounter: Payer: Self-pay | Admitting: Physician Assistant

## 2012-09-04 VITALS — BP 126/62 | HR 100 | Ht 69.0 in | Wt 154.4 lb

## 2012-09-04 VITALS — BP 138/78 | HR 94 | Wt 155.0 lb

## 2012-09-04 DIAGNOSIS — Z79899 Other long term (current) drug therapy: Secondary | ICD-10-CM

## 2012-09-04 DIAGNOSIS — R531 Weakness: Secondary | ICD-10-CM

## 2012-09-04 DIAGNOSIS — I2510 Atherosclerotic heart disease of native coronary artery without angina pectoris: Secondary | ICD-10-CM

## 2012-09-04 DIAGNOSIS — I509 Heart failure, unspecified: Secondary | ICD-10-CM

## 2012-09-04 DIAGNOSIS — I10 Essential (primary) hypertension: Secondary | ICD-10-CM

## 2012-09-04 DIAGNOSIS — R5381 Other malaise: Secondary | ICD-10-CM

## 2012-09-04 DIAGNOSIS — I5023 Acute on chronic systolic (congestive) heart failure: Secondary | ICD-10-CM

## 2012-09-04 MED ORDER — METOLAZONE 2.5 MG PO TABS: 2.5000 mg | ORAL_TABLET | ORAL | Status: DC

## 2012-09-04 MED ORDER — POTASSIUM CHLORIDE ER 10 MEQ PO CPCR: ORAL_CAPSULE | ORAL | Status: DC

## 2012-09-04 NOTE — Patient Instructions (Addendum)
Follow up with your cardiology team for your CHF. Please return to see Dr. Christella Hartigan in in 4-5 weeks. Considering getting an MRI angiogram of the messenteric vessels. Stay on daily miralax.

## 2012-09-04 NOTE — Assessment & Plan Note (Signed)
Patient continues to have heart failure despite large increases in her Lasix. All add Zaroxolyn 2.5 mg 3 times a week and increase her potassium as well. So far her kidneys have tolerated the increase in diuretics. I've also encouraged her to wear her TED hose and discussed low-sodium diet which she seems to be following. She will see Dr.Hochrein in 2 weeks.

## 2012-09-04 NOTE — Progress Notes (Signed)
HPI:  This is a 67 year old female patient of Dr.Hochrein who I have been seeing regularly for acute on chronic heart failure. I've increased her Lasix on a regular basis but she continues to have significant edema without weight loss. She says she watches her sodium intake closely but she will wear the TED hose because they're too tight. She has been on Lasix 120 mg twice a day without significant change in her weight or how she feels. She still complains of orthopnea and having to sleep in a chair at night. Her renal function has remained stable.  She has a history of syncope felt related to bradycardia from beta blockers which were discontinued. 2-D echo 04/2012 has showed an ejection fraction of 45% but EF by Myoview was 14% 04/27/12. Cardiac catheterization EF was 20%. At that time she had 20% left main, distal LAD was very small and diseased with apical 99% stenosis unchanged, circumflex had moderate plaque. She had a patent stent in the RCA and the distal stent had some mild restenosis. There was some distal PDA 99% stenosis but was very small caliber vessel.   Allergies -- Amlodipine -- Other (See Comments)   --  constipation  -- Ramipril -- Cough  -- Adhesive [Tape] -- Hives   --  "peels skin off"  -- Penicillins -- Itching  -- Codeine -- Rash  Current Outpatient Prescriptions on File Prior to Visit: acetaminophen (TYLENOL) 500 MG tablet, Take 500 mg by mouth every 6 (six) hours as needed for pain., Disp: , Rfl:  albuterol (PROVENTIL) (2.5 MG/3ML) 0.083% nebulizer solution, Take 2.5 mg by nebulization every 6 (six) hours as needed for wheezing., Disp: , Rfl:  aspirin 81 MG tablet, Take 81 mg by mouth daily.  , Disp: , Rfl:  atorvastatin (LIPITOR) 40 MG tablet, Take 40 mg by mouth every morning., Disp: , Rfl:  diclofenac sodium (VOLTAREN) 1 % GEL, Apply 2 g topically 4 (four) times daily., Disp: 2 Tube, Rfl: 2 feeding supplement (GLUCERNA SHAKE) LIQD, Take 237 mLs by mouth every morning.,  Disp: , Rfl:  ferrous fumarate (FERRETTS) 325 (106 FE) MG TABS, Take 1 tablet (106 mg of iron total) by mouth 2 (two) times daily., Disp: 60 tablet, Rfl: 11 furosemide (LASIX) 80 MG tablet, TAKE 1.5 TABLETS TWICE A DAY, Disp: 90 tablet, Rfl: 3 gabapentin (NEURONTIN) 300 MG capsule, Take 1 capsule (300 mg total) by mouth 2 (two) times daily., Disp: 60 capsule, Rfl: 5 hydrALAZINE (APRESOLINE) 50 MG tablet, Take 50 mg by mouth every 8 (eight) hours., Disp: , Rfl:  hydrOXYzine (ATARAX/VISTARIL) 10 MG tablet, Take 10 mg by mouth 2 (two) times daily., Disp: , Rfl:  isosorbide mononitrate (IMDUR) 60 MG 24 hr tablet, Take 60 mg by mouth daily at 12 noon., Disp: , Rfl:  nitroGLYCERIN (NITROSTAT) 0.4 MG SL tablet, Place 1 tablet (0.4 mg total) under the tongue every 5 (five) minutes x 3 doses as needed for chest pain., Disp: 25 tablet, Rfl: 3 olmesartan (BENICAR) 40 MG tablet, Take 1 tablet (40 mg total) by mouth every morning., Disp: 30 tablet, Rfl: 11 omeprazole (PRILOSEC) 40 MG capsule, Take 40 mg by mouth daily., Disp: , Rfl:  polyethylene glycol (MIRALAX / GLYCOLAX) packet, Take 17 g by mouth daily as needed (for constipation)., Disp: , Rfl:  potassium chloride (MICRO-K) 10 MEQ CR capsule, TAKE 3 TABLETS TWICE A DAY, Disp: 180 capsule, Rfl: 3 sitaGLIPtan-metformin (JANUMET) 50-500 MG per tablet, Take 1 tablet by mouth 2 (two) times daily with a  meal., Disp: , Rfl:  tiotropium (SPIRIVA) 18 MCG inhalation capsule, Place 18 mcg into inhaler and inhale daily., Disp: , Rfl:  traZODone (DESYREL) 100 MG tablet, Take 150 mg by mouth at bedtime., Disp: , Rfl:  venlafaxine XR (EFFEXOR-XR) 150 MG 24 hr capsule, Take 150 mg by mouth every morning., Disp: , Rfl:  [DISCONTINUED] budesonide-formoterol (SYMBICORT) 80-4.5 MCG/ACT inhaler, Inhale 2 puffs into the lungs 2 (two) times daily., Disp: 1 Inhaler, Rfl: 12  No current facility-administered medications on file prior to visit.   Past Medical History:   DM                                                            DYSLIPIDEMIA                                                 OBESITY NOS                                                  DEPRESSION, RECURRENT, IN PARTIAL REMISSION                  HYPERTENSION                                                 CORONARY ARTERY DISEASE                                        Comment:S/p Q wave MI 2002 with RCA stent and AngioJet               Cath 4/30 99% distal LAD stenosis unchanged               from previous, 20% left main stenosis, patent               stents   CONGESTIVE HEART FAILURE                                       Comment:EF 40-45% echo 05/2011   GERD                                                         DIVERTICULOSIS, COLON, WITH HEMORRHAGE                       BACK PAIN, LUMBAR, WITH RADICULOPATHY                        OBSTRUCTIVE SLEEP APNEA  Asthma                                                       Anemia, iron deficiency                                      Hyponatremia                                    12/2011      Tobacco abuse                                                Moderate COPD (chronic obstructive pulmonary d* 11/07/2005      Comment:Spirometry 03/09/11>>FEV1 2.02 (88%), FEV1% 76    Anxiety                                                      Arthritis                                                    Blood transfusion without reported diagnosis    1970's       Cataract                                                     Myocardial infarction                           2001        Past Surgical History:   uvuloplasty                                                   TOTAL ABDOMINAL HYSTERECTOMY W/ BILATERAL SALP*               ABDOMINAL HYSTERECTOMY                                        CHOLECYSTECTOMY                                  2004         CORONARY ANGIOPLASTY WITH STENT PLACEMENT        2002  Comment:last  04-29-12   ESOPHAGOGASTRODUODENOSCOPY (EGD) WITH PROPOFOL  N/A 06/06/2012       Comment:Procedure: ESOPHAGOGASTRODUODENOSCOPY (EGD)               WITH PROPOFOL;  Surgeon: Rachael Fee, MD;                Location: [facility];  Service: Endoscopy;                Laterality: N/A;  Review of patient's family history indicates:   Stroke                         Mother                   Breast cancer                  Sister                   Emphysema                      Sister                   Cancer                         Sister                     Comment: breast   Alcohol abuse                  Brother                  Colon cancer                                              Comment: uncles x 2   Prostate cancer                Brother                  Social History   Marital Status: Married             Spouse Name:                      Years of Education: 12              Number of children: 1           Occupational History Occupation          Associate Professor            Comment              Retired Engineer, structural*                       Social History Main Topics   Smoking Status: Former Smoker                   Packs/Day: 0.25  Years: 20        Types: Cigarettes     Quit date: 07/17/2012   Smokeless Status: Never Used  Comment: Still smokes a little.   Alcohol Use: No                Comment: Patient drinks cafinated daily.   Drug Use: No             Sexual Activity: Not Currently          Birth Control/Protection: Surgical  Other Topics            Concern   None on file  Social History Narrative   None on file    ROS: See history of present illness otherwise negative   PHYSICAL EXAM: Well-nournished, in no acute distress. Neck: Increased JVD, HJR, no Bruit, or thyroid enlargement  Lungs: No tachypnea, clear without wheezing, rales, or rhonchi  Cardiovascular: RRR, PMI not displaced,  Positive S4 and 1-2/6 systolic murmur at the left sernal border,  no bruit, thrill, or heave.  Abdomen: BS normal. Soft without organomegaly, masses, lesions or tenderness.  Extremities: +1-2 edema bilaterally up her knees otherwise without cyanosis, clubbing . Good distal pulses bilateral  SKin: Warm, no lesions or rashes   Musculoskeletal: No deformities  Neuro: no focal signs  BP 138/78  Pulse 94  Wt 155 lb (70.308 kg)  BMI 22.88 kg/m2     2-D echo 04/2012  Study Conclusions  - Left ventricle: The cavity size was mildly dilated. Wall   thickness was increased in a pattern of mild LVH. Systolic   function was mildly to moderately reduced. The estimated   ejection fraction was in the range of 40% to 45%. Wall   motion was normal; there were no regional wall motion   abnormalities. - Left atrium: The atrium was mildly dilated. - Right atrium: The atrium was mildly to moderately dilated. - Pulmonary arteries: Systolic pressure was moderately   increased.

## 2012-09-04 NOTE — Assessment & Plan Note (Signed)
Stable

## 2012-09-04 NOTE — Assessment & Plan Note (Signed)
Stable without chest pain 

## 2012-09-04 NOTE — Patient Instructions (Addendum)
Your physician recommends that you schedule a follow-up appointment in: WITH [facility] AS  SCHEDULED  Your physician has recommended you make the following change in your medication:  ZAROXOLYN  2.5 MG  3 TIMES  A WEEK   AND  INCREASE POTASSIUM TO 4 TABS   TWICE A DAY ON  ZAROXOLYN DAYS Your physician recommends that you return for lab work in:  [place]   BMET   DX   V58.69   09/11/12

## 2012-09-04 NOTE — Progress Notes (Signed)
Review of pertinent gastrointestinal problems:  1. Likely diabetic gastroparesis causing nausea, bloating, some abdominal discomfort. I do not see any previous gastric emptying scans to prove this.  2. History of colon polyps. Last colonoscopy June 2008 by Dr. Victorino Dike found diverticulosis, tortuous colon, no polyps. He recommended she have a repeat colonoscopy in 8 years. I have not found polyp pathology in her chart. Probable diverticular bleed February, 2009. Admitted to the hospital with bright red blood per rectum, mild anemia, bleeding resolved without intervention. No need for blood transfusion. Colonoscopy 2013 found no polyps, recommended recall at 5 years  3. Chronic constipation. Constipation imroving with daily Miralax.  4. Weight loss, anorexia, mild abd pains 2014: CT 05/2012 mild mid SB dilation without focal transition, unclear if this is pathologic; UGI with SBFT 06/2012 normal except 6cm duodenal diverticulum; EGD 05/2012 mod to severe gastritis, biopsies showed no h. Pylori.  Celiac sprue panel 07/2012 normal.   HPI: This is a  very pleasant 67 year old woman whom I last saw about 2 months ago.  She has a history of syncope felt related to bradycardia from beta blockers which were discontinued 2-D echo showed EF of 45% by Myoview showed an EF of 14%. Cardiac catheterization gauge the ejection fraction at 20%. She also had 20% left main, distal LAD was very small and diseased with apical 99% stenosis unchanged, circumflex had moderate plaque. There was a stent in the RCA that was patent. The distal stent had some mild restenosis. There was some distal PDA 99% stenosis but this was very small caliber vessel.  She has gained 10 pounds in the last 2 months based on same scale here in GI.  Has appt with cardiology at 12:45.  Has no energy, sleeps all day.  Really takes NO NSAIDs except one 81mg  ASA.  Her breathing is "so so."   Sleeps with oxygen at night.  Her constipation is  improving on DAILY miralax.  She is happy with that change.  May be avoiding meals.    Past Medical History  Diagnosis Date  . DM   . DYSLIPIDEMIA   . OBESITY NOS   . DEPRESSION, RECURRENT, IN PARTIAL REMISSION   . HYPERTENSION   . 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 CONGESTIVE HEART FAILURE     EF 40-45% echo 05/2011  . GERD   . DIVERTICULOSIS, COLON, WITH HEMORRHAGE   . BACK PAIN, LUMBAR, WITH RADICULOPATHY   . OBSTRUCTIVE SLEEP APNEA   . Asthma   . Anemia, iron deficiency   . Hyponatremia 12/2011  . Tobacco abuse   . Moderate COPD (chronic obstructive pulmonary disease) 11/07/2005    Spirometry 03/09/11>>FEV1 2.02 (88%), FEV1% 76    . Anxiety   . Arthritis   . Blood transfusion without reported diagnosis 1970's  . Cataract   . Myocardial infarction 2001    Past Surgical History  Procedure Laterality Date  . Uvuloplasty    . Total abdominal hysterectomy w/ bilateral salpingoophorectomy    . Abdominal hysterectomy    . Cholecystectomy  2004  . Coronary angioplasty with stent placement  2002    last 04-29-12  . Esophagogastroduodenoscopy (egd) with propofol N/A 06/06/2012    Procedure: ESOPHAGOGASTRODUODENOSCOPY (EGD) WITH PROPOFOL;  Surgeon: Rachael Fee, MD;  Location: [facility];  Service: Endoscopy;  Laterality: N/A;  Current Outpatient Prescriptions  Medication Sig Dispense Refill  . acetaminophen (TYLENOL) 500 MG tablet Take 500 mg by mouth every 6 (six) hours as needed for pain.      [name] albuterol (PROVENTIL) (2.5 MG/3ML) 0.083% nebulizer solution Take 2.5 mg by nebulization every 6 (six) hours as needed for wheezing.      [name] aspirin 81 MG tablet Take 81 mg by mouth daily.        [name] atorvastatin (LIPITOR) 40 MG tablet Take 40 mg by mouth every morning.      . diclofenac sodium (VOLTAREN) 1 % GEL Apply 2 g topically 4 (four)  times daily.  2 Tube  2  . feeding supplement (GLUCERNA SHAKE) LIQD Take 237 mLs by mouth every morning.      . ferrous fumarate (FERRETTS) 325 (106 FE) MG TABS Take 1 tablet (106 mg of iron total) by mouth 2 (two) times daily.  60 tablet  11  . furosemide (LASIX) 80 MG tablet TAKE 1.5 TABLETS TWICE A DAY  90 tablet  3  . gabapentin (NEURONTIN) 300 MG capsule Take 1 capsule (300 mg total) by mouth 2 (two) times daily.  60 capsule  5  . hydrALAZINE (APRESOLINE) 50 MG tablet Take 50 mg by mouth every 8 (eight) hours.      . hydrOXYzine (ATARAX/VISTARIL) 10 MG tablet Take 10 mg by mouth 2 (two) times daily.      . isosorbide mononitrate (IMDUR) 60 MG 24 hr tablet Take 60 mg by mouth daily at 12 noon.      . nitroGLYCERIN (NITROSTAT) 0.4 MG SL tablet Place 1 tablet (0.4 mg total) under the tongue every 5 (five) minutes x 3 doses as needed for chest pain.  25 tablet  3  . olmesartan (BENICAR) 40 MG tablet Take 1 tablet (40 mg total) by mouth every morning.  30 tablet  11  . omeprazole (PRILOSEC) 40 MG capsule Take 40 mg by mouth daily.      . polyethylene glycol (MIRALAX / GLYCOLAX) packet Take 17 g by mouth daily as needed (for constipation).      . potassium chloride (MICRO-K) 10 MEQ CR capsule TAKE 3 TABLETS TWICE A DAY  180 capsule  3  . sitaGLIPtan-metformin (JANUMET) 50-500 MG per tablet Take 1 tablet by mouth 2 (two) times daily with a meal.      . tiotropium (SPIRIVA) 18 MCG inhalation capsule Place 18 mcg into inhaler and inhale daily.      . traZODone (DESYREL) 100 MG tablet Take 150 mg by mouth at bedtime.      [name] venlafaxine XR (EFFEXOR-XR) 150 MG 24 hr capsule Take 150 mg by mouth every morning.      . [DISCONTINUED] budesonide-formoterol (SYMBICORT) 80-4.5 MCG/ACT inhaler Inhale 2 puffs into the lungs 2 (two) times daily.  1 Inhaler  12   No current facility-administered medications for this visit.    Allergies as of 09/04/2012 - Review Complete 09/04/2012  Allergen Reaction Noted  .  Amlodipine Other (See Comments) 01/30/2011  . Ramipril Cough 09/26/2010  . Adhesive [tape] Hives 09/26/2010  . Penicillins Itching   . Codeine Rash 09/26/2010    Family History  Problem Relation Age of Onset  . Stroke Mother   . Breast cancer Sister   . Emphysema Sister   . Cancer Sister     breast  . Alcohol abuse Brother   . Colon cancer      uncles x 2  . Prostate cancer  Brother     History   Social History  . Marital Status: Married    Spouse Name: N/A    Number of Children: 1  . Years of Education: 12   Occupational History  . Retired Insurance account manager for [employer]    Social History Main Topics  . Smoking status: Former Smoker -- 0.25 packs/day for 20 years    Types: Cigarettes    Quit date: 07/17/2012  . Smokeless tobacco: Never Used     Comment: Still smokes a little.  . Alcohol Use: No     Comment: Patient drinks cafinated daily.  . Drug Use: No  . Sexual Activity: Not Currently    Birth Control/ Protection: Surgical   Other Topics Concern  . Not on file   Social History Narrative  . No narrative on file      Physical Exam: BP 126/62  Pulse 100  Ht 5\' 9"  (1.753 m)  Wt 154 lb 6.4 oz (70.035 kg)  BMI 22.79 kg/m2 Constitutional: Chronically ill-appearing Psychiatric: alert and oriented x3 Abdomen: soft, nontender, nondistended, no obvious ascites, no peritoneal signs, normal bowel sounds One plus lower extremity edema bilaterally    Assessment and plan: 67 y.o. female with one year of weight loss, CHF  Her constipation has improved with daily MiraLax. She has lost 50-70 pounds in the past year and a half or so. In that time. She has had colonoscopy and upper endoscopy without clear source. She has moderate to severe gastritis not related to H. pylori. Perhaps she is having some mesenteric ischemia. Given her cardiac condition that would not surprise me. She has 1+ edema in her ankles today. She is going to see her cardiology team  later this morning. I would like her to come back in about a month and at that point we'll consider workup for mesenteric ischemia. Arguing against that is that she really has no significant food phobia, post prandial pains to speak of.

## 2012-09-05 ENCOUNTER — Telehealth: Payer: Self-pay | Admitting: *Deleted

## 2012-09-05 DIAGNOSIS — R309 Painful micturition, unspecified: Secondary | ICD-10-CM

## 2012-09-05 NOTE — Telephone Encounter (Signed)
Bethann Berkshire from [facility] called states pt complains of cloudy urine and pain upon urination.  Requesting order for U/A with culture.  Please advise

## 2012-09-05 NOTE — Telephone Encounter (Signed)
ok 

## 2012-09-09 ENCOUNTER — Telehealth: Payer: Self-pay | Admitting: *Deleted

## 2012-09-09 NOTE — Telephone Encounter (Signed)
A user error has taken place.

## 2012-09-17 ENCOUNTER — Other Ambulatory Visit (HOSPITAL_BASED_OUTPATIENT_CLINIC_OR_DEPARTMENT_OTHER): Payer: Medicare Other | Admitting: Lab

## 2012-09-17 ENCOUNTER — Ambulatory Visit (HOSPITAL_BASED_OUTPATIENT_CLINIC_OR_DEPARTMENT_OTHER): Payer: Medicare Other | Admitting: Internal Medicine

## 2012-09-17 ENCOUNTER — Ambulatory Visit: Payer: Medicare Other | Admitting: Cardiology

## 2012-09-17 ENCOUNTER — Ambulatory Visit (INDEPENDENT_AMBULATORY_CARE_PROVIDER_SITE_OTHER): Payer: Medicare Other | Admitting: Cardiology

## 2012-09-17 ENCOUNTER — Telehealth: Payer: Self-pay | Admitting: Internal Medicine

## 2012-09-17 ENCOUNTER — Encounter: Payer: Self-pay | Admitting: Internal Medicine

## 2012-09-17 ENCOUNTER — Encounter: Payer: Self-pay | Admitting: Cardiology

## 2012-09-17 VITALS — BP 117/63 | HR 87 | Temp 97.5°F | Resp 20 | Ht 69.0 in | Wt 152.6 lb

## 2012-09-17 VITALS — BP 124/82 | HR 93 | Ht 69.0 in | Wt 154.0 lb

## 2012-09-17 DIAGNOSIS — I10 Essential (primary) hypertension: Secondary | ICD-10-CM

## 2012-09-17 DIAGNOSIS — Z79899 Other long term (current) drug therapy: Secondary | ICD-10-CM

## 2012-09-17 DIAGNOSIS — D509 Iron deficiency anemia, unspecified: Secondary | ICD-10-CM

## 2012-09-17 LAB — BASIC METABOLIC PANEL
Calcium: 8.7 mg/dL (ref 8.4–10.5)
Chloride: 86 mEq/L — ABNORMAL LOW (ref 96–112)
Creatinine, Ser: 1.2 mg/dL (ref 0.4–1.2)
GFR: 56.79 mL/min — ABNORMAL LOW (ref >60.00)

## 2012-09-17 LAB — CBC & DIFF AND RETIC
BASO%: 0.4 % (ref 0.0–2.0)
Basophils Absolute: 0 10*3/uL (ref 0.0–0.1)
EOS%: 3.1 % (ref 0.0–7.0)
HCT: 29.1 % — ABNORMAL LOW (ref 34.8–46.6)
HGB: 9.6 g/dL — ABNORMAL LOW (ref 11.6–15.9)
Immature Retic Fract: 6.3 % (ref 1.60–10.00)
LYMPH%: 22.3 % (ref 14.0–49.7)
MCH: 30.9 pg (ref 25.1–34.0)
MCHC: 33 g/dL (ref 31.5–36.0)
MCV: 93.6 fL (ref 79.5–101.0)
NEUT%: 68.6 % (ref 38.4–76.8)
Platelets: 307 10*3/uL (ref 145–400)
lymph#: 1 10*3/uL (ref 0.9–3.3)

## 2012-09-17 LAB — FERRITIN CHCC: Ferritin: 168 ng/ml (ref 9–269)

## 2012-09-17 LAB — IRON AND TIBC CHCC
%SAT: 20 % — ABNORMAL LOW (ref 21–57)
TIBC: 214 ug/dL — ABNORMAL LOW (ref 236–444)
UIBC: 170 ug/dL (ref 120–384)

## 2012-09-17 NOTE — Telephone Encounter (Signed)
Per staff message and POF I have scheduled appts.  JMW  

## 2012-09-17 NOTE — Telephone Encounter (Signed)
Talked to pt and gave her appt for IViron on 9/19 and 9/26

## 2012-09-17 NOTE — Telephone Encounter (Signed)
Gave pt appt gfor lab and MD on December 2014 , email;ed Michelle regarding IV Iron on 9/19 and 9/26

## 2012-09-17 NOTE — Progress Notes (Signed)
HPI The patient returns for followup of cardiomyopathy.  She was  Hospitalized earlier this year after syncope felt to be related to bradycardia from beta blockers. These were discontinued. She did have some chest discomfort ruled out for myocardial infarction. Echocardiography demonstrated EF 45% by echo.  She had a cardiac catheterization which demonstrated left main 20% stenosis. The distal LAD was very small and diseased with apical 99% stenosis unchanged. Circumflex had some moderate plaque. There was a mid stent in the right coronary that was patent. The distal stent had some mild restenosis. There was some distal PDA 99% stenosis but this was a very small caliber vessel. She was managed medically.    She was recently seen with some increased edema. Her dose of diuretic was increased. At that time she was having some orthopnea and increased dyspnea and this seems to have improved. Her weight has stayed the same.  However, the edema is reduced. She continues to be followed for anemia iron deficiency with an unknown etiology. She's had weight loss which seems to have stabilized. She is also having  Balance problems. She is now seeing a neurologist. She's not having any chest pressure, neck or arm discomfort. She's not noticing any palpitations, presyncope or syncope. She's not having any PND or orthopnea.  Allergies  Allergen Reactions  . Amlodipine Other (See Comments)    constipation  . Ramipril Cough  . Adhesive [Tape] Hives    "peels skin off"  . Penicillins Itching  . Codeine Rash    Current Outpatient Prescriptions  Medication Sig Dispense Refill  . acetaminophen (TYLENOL) 500 MG tablet Take 500 mg by mouth every 6 (six) hours as needed for pain.      [name] albuterol (PROVENTIL) (2.5 MG/3ML) 0.083% nebulizer solution Take 2.5 mg by nebulization every 6 (six) hours as needed for wheezing.      [name] aspirin 81 MG tablet Take 81 mg by mouth daily.        [name] atorvastatin (LIPITOR) 40 MG tablet  Take 40 mg by mouth every morning.      . diclofenac sodium (VOLTAREN) 1 % GEL Apply 2 g topically 4 (four) times daily.  2 Tube  2  . feeding supplement (GLUCERNA SHAKE) LIQD Take 237 mLs by mouth every morning.      . furosemide (LASIX) 80 MG tablet TAKE 1.5 TABLETS TWICE A DAY  90 tablet  3  . gabapentin (NEURONTIN) 300 MG capsule Take 1 capsule (300 mg total) by mouth 2 (two) times daily.  60 capsule  5  . hydrALAZINE (APRESOLINE) 50 MG tablet Take 50 mg by mouth every 8 (eight) hours.      . hydrOXYzine (ATARAX/VISTARIL) 10 MG tablet Take 10 mg by mouth 2 (two) times daily.      . isosorbide mononitrate (IMDUR) 60 MG 24 hr tablet Take 60 mg by mouth daily at 12 noon.      . metolazone (ZAROXOLYN) 2.5 MG tablet Take 1 tablet (2.5 mg total) by mouth 3 (three) times a week.  12 tablet  6  . nitroGLYCERIN (NITROSTAT) 0.4 MG SL tablet Place 1 tablet (0.4 mg total) under the tongue every 5 (five) minutes x 3 doses as needed for chest pain.  25 tablet  3  . olmesartan (BENICAR) 40 MG tablet Take 1 tablet (40 mg total) by mouth every morning.  30 tablet  11  . omeprazole (PRILOSEC) 40 MG capsule Take 40 mg by mouth daily.      [name]  polyethylene glycol (MIRALAX / GLYCOLAX) packet Take 17 g by mouth daily as needed (for constipation).      . potassium chloride (MICRO-K) 10 MEQ CR capsule TAKE 3 TABLETS TWICE A DAY  INCREASE POTASSIUM TO 4 TABS BID ON DAYS TAKES  ZAROXOLYN  180 capsule  3  . sitaGLIPtan-metformin (JANUMET) 50-500 MG per tablet Take 1 tablet by mouth 2 (two) times daily with a meal.      . tiotropium (SPIRIVA) 18 MCG inhalation capsule Place 18 mcg into inhaler and inhale daily.      . traZODone (DESYREL) 100 MG tablet Take 150 mg by mouth at bedtime.      [name] venlafaxine XR (EFFEXOR-XR) 150 MG 24 hr capsule Take 150 mg by mouth every morning.      . [DISCONTINUED] budesonide-formoterol (SYMBICORT) 80-4.5 MCG/ACT inhaler Inhale 2 puffs into the lungs 2 (two) times daily.  1 Inhaler  12   No  current facility-administered medications for this visit.    Past Medical History  Diagnosis Date  . DM   . DYSLIPIDEMIA   . OBESITY NOS   . DEPRESSION, RECURRENT, IN PARTIAL REMISSION   . HYPERTENSION   . 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 CONGESTIVE HEART FAILURE     EF 40-45% echo 05/2011  . GERD   . DIVERTICULOSIS, COLON, WITH HEMORRHAGE   . BACK PAIN, LUMBAR, WITH RADICULOPATHY   . OBSTRUCTIVE SLEEP APNEA   . Asthma   . Anemia, iron deficiency   . Hyponatremia 12/2011  . Tobacco abuse   . Moderate COPD (chronic obstructive pulmonary disease) 11/07/2005    Spirometry 03/09/11>>FEV1 2.02 (88%), FEV1% 76    . Anxiety   . Arthritis   . Blood transfusion without reported diagnosis 1970's  . Cataract   . Myocardial infarction 2001    Past Surgical History  Procedure Laterality Date  . Uvuloplasty    . Total abdominal hysterectomy w/ bilateral salpingoophorectomy    . Abdominal hysterectomy    . Cholecystectomy  2004  . Coronary angioplasty with stent placement  2002    last 04-29-12  . Esophagogastroduodenoscopy (egd) with propofol N/A 06/06/2012    Procedure: ESOPHAGOGASTRODUODENOSCOPY (EGD) WITH PROPOFOL;  Surgeon: Rachael Fee, MD;  Location: [facility];  Service: Endoscopy;  Laterality: N/A;    ROS  As stated in the HPI and negative for all other systems.  PHYSICAL EXAM BP 124/82  Pulse 93  Ht 5\' 9"  (1.753 m)  Wt 154 lb (69.854 kg)  BMI 22.73 kg/m2  SpO2 98% GENERAL:  Well appearing HEENT:  Pupils equal round and reactive, fundi not visualized, oral mucosa unremarkable, dentures NECK:  No jugular venous distention, waveform within normal limits, carotid upstroke brisk and symmetric, no bruits, no thyromegaly LYMPHATICS:  No cervical, inguinal adenopathy LUNGS:  Clear to auscultation bilaterally BACK:  No CVA  tenderness CHEST:  Unremarkable HEART:  PMI not displaced or sustained,S1 and S2 within normal limits, positive S3, no S4, no clicks, no rubs, no murmurs ABD:  Flat, positive bowel sounds normal in frequency in pitch, no bruits, no rebound, no guarding, no midline pulsatile mass, no hepatomegaly, no splenomegaly, obese EXT:  2 plus pulses throughout, mild ankle edema SKIN:  No rashes no nodules   ASSESSMENT AND PLAN  CORONARY ARTERY DISEASE -  She has disease  as described. We will continue to manage this medically.  HYPERTENSION -  Her blood pressure seems to be controlled off of beta blockers. I will not titrate her meds further.  CONGESTIVE HEART FAILURE -  She seems to be euvolemic at this time. No change in therapy is indicated.  I will repeat an echo when I see her in six months.   BRADYCARDIA - She will remain on beta blockers. I don't think there is any indication for event monitor as she is having no further symptoms.  WEIGHT LOSS - She is going to see the gastroenterologists for further evaluation of difficulty at times swallowing, abdominal pain and an unexplained weight loss.  CARDIOMYOPATHY - As above I will check this in six months.

## 2012-09-17 NOTE — Progress Notes (Signed)
[facility] [phone]   [phone]  OFFICE PROGRESS NOTE  [name], MD [address] [facility] [address] [place], [address] [place] [address]  DIAGNOSIS: Iron deficiency anemia   PRIOR THERAPY:Feraheme infusion x2 doses last dose was given on 05/10/2012.   CURRENT THERAPY: ferrous fumarate 325 mg by mouth twice a day.   INTERVAL HISTORY: [name] 67 y.o. female returns to the clinic today for followup visit accompanied by her husband. The patient continues to complain of fatigue and occasional dizzy spells especially with change in position. She has not taking her iron tablets for the last few weeks. She denied having any bleeding issues. The patient denied having any chest pain, shortness breath, cough or hemoptysis. She has no weight loss or night sweats. She has no nausea or vomiting. The patient has repeat CBC and iron study performed earlier today and she is here for evaluation and discussion of her lab results.  MEDICAL HISTORY: Past Medical History  Diagnosis Date  . DM   . DYSLIPIDEMIA   . OBESITY NOS   . DEPRESSION, RECURRENT, IN PARTIAL REMISSION   . HYPERTENSION   . 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 CONGESTIVE HEART FAILURE     EF 40-45% echo 05/2011  . GERD   . DIVERTICULOSIS, COLON, WITH HEMORRHAGE   . BACK PAIN, LUMBAR, WITH RADICULOPATHY   . OBSTRUCTIVE SLEEP APNEA   . Asthma   . Anemia, iron deficiency   . Hyponatremia 12/2011  . Tobacco abuse   . Moderate COPD (chronic obstructive pulmonary disease) 11/07/2005    Spirometry 03/09/11>>FEV1 2.02 (88%), FEV1% 76    . Anxiety   . Arthritis   . Blood transfusion without reported diagnosis 1970's  . Cataract   . Myocardial infarction 2001    ALLERGIES:  is allergic to amlodipine; ramipril; adhesive; penicillins; and  codeine.  MEDICATIONS:  Current Outpatient Prescriptions  Medication Sig Dispense Refill  . acetaminophen (TYLENOL) 500 MG tablet Take 500 mg by mouth every 6 (six) hours as needed for pain.      [name] albuterol (PROVENTIL) (2.5 MG/3ML) 0.083% nebulizer solution Take 2.5 mg by nebulization every 6 (six) hours as needed for wheezing.      [name] aspirin 81 MG tablet Take 81 mg by mouth daily.        [name] atorvastatin (LIPITOR) 40 MG tablet Take 40 mg by mouth every morning.      . diclofenac sodium (VOLTAREN) 1 % GEL Apply 2 g topically 4 (four) times daily.  2 Tube  2  . feeding supplement (GLUCERNA SHAKE) LIQD Take 237 mLs by mouth every morning.      . ferrous fumarate (FERRETTS) 325 (106 FE) MG TABS Take 1 tablet (106 mg of iron total) by mouth 2 (two) times daily.  60 tablet  11  . furosemide (LASIX) 80 MG tablet TAKE 1.5 TABLETS TWICE A DAY  90 tablet  3  . gabapentin (NEURONTIN) 300 MG capsule Take 1 capsule (300 mg total) by mouth 2 (two) times daily.  60 capsule  5  . hydrALAZINE (APRESOLINE) 50 MG tablet Take 50 mg by mouth every 8 (eight) hours.      . hydrOXYzine (ATARAX/VISTARIL) 10 MG tablet Take 10 mg by mouth 2 (two) times  daily.      . isosorbide mononitrate (IMDUR) 60 MG 24 hr tablet Take 60 mg by mouth daily at 12 noon.      . metolazone (ZAROXOLYN) 2.5 MG tablet Take 1 tablet (2.5 mg total) by mouth 3 (three) times a week.  12 tablet  6  . nitroGLYCERIN (NITROSTAT) 0.4 MG SL tablet Place 1 tablet (0.4 mg total) under the tongue every 5 (five) minutes x 3 doses as needed for chest pain.  25 tablet  3  . olmesartan (BENICAR) 40 MG tablet Take 1 tablet (40 mg total) by mouth every morning.  30 tablet  11  . omeprazole (PRILOSEC) 40 MG capsule Take 40 mg by mouth daily.      . polyethylene glycol (MIRALAX / GLYCOLAX) packet Take 17 g by mouth daily as needed (for constipation).      . potassium chloride (MICRO-K) 10 MEQ CR capsule TAKE 3 TABLETS TWICE A DAY  INCREASE POTASSIUM TO 4 TABS BID  ON DAYS TAKES  ZAROXOLYN  180 capsule  3  . sitaGLIPtan-metformin (JANUMET) 50-500 MG per tablet Take 1 tablet by mouth 2 (two) times daily with a meal.      . tiotropium (SPIRIVA) 18 MCG inhalation capsule Place 18 mcg into inhaler and inhale daily.      . traZODone (DESYREL) 100 MG tablet Take 150 mg by mouth at bedtime.      [name] venlafaxine XR (EFFEXOR-XR) 150 MG 24 hr capsule Take 150 mg by mouth every morning.      . [DISCONTINUED] budesonide-formoterol (SYMBICORT) 80-4.5 MCG/ACT inhaler Inhale 2 puffs into the lungs 2 (two) times daily.  1 Inhaler  12   No current facility-administered medications for this visit.    SURGICAL HISTORY:  Past Surgical History  Procedure Laterality Date  . Uvuloplasty    . Total abdominal hysterectomy w/ bilateral salpingoophorectomy    . Abdominal hysterectomy    . Cholecystectomy  2004  . Coronary angioplasty with stent placement  2002    last 04-29-12  . Esophagogastroduodenoscopy (egd) with propofol N/A 06/06/2012    Procedure: ESOPHAGOGASTRODUODENOSCOPY (EGD) WITH PROPOFOL;  Surgeon: Rachael Fee, MD;  Location: [facility];  Service: Endoscopy;  Laterality: N/A;    REVIEW OF SYSTEMS:  A comprehensive review of systems was negative except for: Constitutional: positive for fatigue Neurological: positive for dizziness   PHYSICAL EXAMINATION: General appearance: alert, cooperative, fatigued and no distress Head: Normocephalic, without obvious abnormality, atraumatic Neck: no adenopathy, supple, symmetrical, trachea midline and thyroid not enlarged, symmetric, no tenderness/mass/nodules Lymph nodes: Cervical, supraclavicular, and axillary nodes normal. Resp: clear to auscultation bilaterally and normal percussion bilaterally Cardio: regular rate and rhythm, S1, S2 normal, no murmur, click, rub or gallop GI: soft, non-tender; bowel sounds normal; no masses,  no organomegaly Extremities: extremities normal, atraumatic, no cyanosis or edema  ECOG  PERFORMANCE STATUS: 1 - Symptomatic but completely ambulatory  Blood pressure 117/63, pulse 87, temperature 97.5 F (36.4 C), temperature source Oral, resp. rate 20, height 5\' 9"  (1.753 m), weight 152 lb 9.6 oz (69.219 kg).  LABORATORY DATA: Lab Results  Component Value Date   WBC 4.5 09/17/2012   HGB 9.6* 09/17/2012   HCT 29.1* 09/17/2012   MCV 93.6 09/17/2012   PLT 307 09/17/2012      Chemistry      Component Value Date/Time   NA 136 08/28/2012 0926   NA 138 04/16/2012 0936   K 4.3 08/28/2012 0926   K 4.0 04/16/2012 0936  CL 99 08/28/2012 0926   CL 98 04/16/2012 0936   CO2 31 08/28/2012 0926   CO2 30* 04/16/2012 0936   BUN 11 08/28/2012 0926   BUN 9.8 04/16/2012 0936   CREATININE 1.1 08/28/2012 0926   CREATININE 1.1 04/16/2012 0936      Component Value Date/Time   CALCIUM 9.1 08/28/2012 0926   CALCIUM 8.9 04/16/2012 0936   ALKPHOS 88 04/16/2012 0936   ALKPHOS 75 03/25/2012 1501   AST 13 04/16/2012 0936   AST 14 03/25/2012 1501   ALT 7 04/16/2012 0936   ALT 10 03/25/2012 1501   BILITOT 0.48 04/16/2012 0936   BILITOT 0.6 03/25/2012 1501       RADIOGRAPHIC STUDIES: No results found.  ASSESSMENT AND PLAN: This is a very pleasant 67 years old African American female with history of iron deficiency anemia status post Feraheme infusion last dose was given in may of 2014 in addition to oral iron tablets but she has been off her medication for the last few weeks. The patient had significant decline in her hemoglobin and hematocrit he and iron study is still pending. I recommended for her to consider Feraheme infusion 510 mg intravenously x2 doses. First dose will be given on 09/20/2012. I would see the patient back for followup visit in 3 months with repeat CBC and iron study. She was also advised to resume her oral over the counter iron tablets 2-3 tablets every day. The patient was also advised to call immediately if she has any concerning symptoms in the interval.  The patient voices  understanding of current disease status and treatment options and is in agreement with the current care plan.  All questions were answered. The patient knows to call the clinic with any problems, questions or concerns. We can certainly see the patient much sooner if necessary.

## 2012-09-17 NOTE — Patient Instructions (Addendum)
The current medical regimen is effective;  continue present plan and medications.  Please have blood work today (BMP)  Follow up in 1 year with Dr Hochrein.  You will receive a letter in the mail 2 months before you are due.  Please call us when you receive this letter to schedule your follow up appointment.  

## 2012-09-18 ENCOUNTER — Other Ambulatory Visit: Payer: Self-pay | Admitting: Pulmonary Disease

## 2012-09-18 ENCOUNTER — Ambulatory Visit
Admission: RE | Admit: 2012-09-18 | Discharge: 2012-09-18 | Disposition: A | Discharge status: Home / Self Care | Payer: Medicare Other | Source: Ambulatory Visit

## 2012-09-18 DIAGNOSIS — Z1231 Encounter for screening mammogram for malignant neoplasm of breast: Secondary | ICD-10-CM

## 2012-09-18 LAB — HM MAMMOGRAPHY: HM Mammogram: ABNORMAL

## 2012-09-19 ENCOUNTER — Other Ambulatory Visit: Payer: Self-pay | Admitting: Internal Medicine

## 2012-09-19 DIAGNOSIS — R928 Other abnormal and inconclusive findings on diagnostic imaging of breast: Secondary | ICD-10-CM

## 2012-09-20 ENCOUNTER — Ambulatory Visit (HOSPITAL_BASED_OUTPATIENT_CLINIC_OR_DEPARTMENT_OTHER): Payer: Medicare Other

## 2012-09-20 ENCOUNTER — Telehealth: Payer: Self-pay | Admitting: Cardiology

## 2012-09-20 ENCOUNTER — Other Ambulatory Visit: Payer: Self-pay | Admitting: *Deleted

## 2012-09-20 VITALS — BP 133/74 | HR 93 | Temp 96.9°F | Resp 16

## 2012-09-20 DIAGNOSIS — E871 Hypo-osmolality and hyponatremia: Secondary | ICD-10-CM

## 2012-09-20 DIAGNOSIS — D509 Iron deficiency anemia, unspecified: Secondary | ICD-10-CM

## 2012-09-20 MED ORDER — HEPARIN SOD (PORK) LOCK FLUSH 100 UNIT/ML IV SOLN
500.0000 [IU] | Freq: Once | INTRAVENOUS | Status: DC | PRN
  Filled 2012-09-20: qty 5

## 2012-09-20 MED ORDER — SODIUM CHLORIDE 0.9 % IV SOLN
Freq: Once | INTRAVENOUS | Status: AC
  Administered 2012-09-20: 11:00:00 via INTRAVENOUS

## 2012-09-20 MED ORDER — FERUMOXYTOL INJECTION 510 MG/17 ML
510.0000 mg | Freq: Once | INTRAVENOUS | Status: AC
  Administered 2012-09-20: 510 mg via INTRAVENOUS
  Filled 2012-09-20: qty 17

## 2012-09-20 NOTE — Telephone Encounter (Signed)
Spoke with pt who states understanding of lab results to restrict free water and to repeat next week.  An order was placed.

## 2012-09-20 NOTE — Patient Instructions (Signed)
Ferumoxytol injection What is this medicine? FERUMOXYTOL is an iron complex. Iron is used to make healthy red blood cells, which carry oxygen and nutrients throughout the body. This medicine is used to treat iron deficiency anemia in people with chronic kidney disease. This medicine may be used for other purposes; ask your health care provider or pharmacist if you have questions. What should I tell my health care provider before I take this medicine? They need to know if you have any of these conditions: -anemia not caused by low iron levels -high levels of iron in the blood -magnetic resonance imaging (MRI) test scheduled -an unusual or allergic reaction to iron, other medicines, foods, dyes, or preservatives -pregnant or trying to get pregnant -breast-feeding How should I use this medicine? This medicine is for infusion into a vein. It is given by a health care professional in a hospital or clinic setting. Talk to your pediatrician regarding the use of this medicine in children. Special care may be needed. Overdosage: If you think you've taken too much of this medicine contact a poison control center or emergency room at once. Overdosage: If you think you have taken too much of this medicine contact a poison control center or emergency room at once. NOTE: This medicine is only for you. Do not share this medicine with others. What if I miss a dose? It is important not to miss your dose. Call your doctor or health care professional if you are unable to keep an appointment. What may interact with this medicine? This medicine may interact with the following medications: -other iron products This list may not describe all possible interactions. Give your health care provider a list of all the medicines, herbs, non-prescription drugs, or dietary supplements you use. Also tell them if you smoke, drink alcohol, or use illegal drugs. Some items may interact with your medicine. What should I watch  for while using this medicine? Visit your doctor or healthcare professional regularly. Tell your doctor or healthcare professional if your symptoms do not start to get better or if they get worse. You may need blood work done while you are taking this medicine. You may need to follow a special diet. Talk to your doctor. Foods that contain iron include: whole grains/cereals, dried fruits, beans, or peas, leafy green vegetables, and organ meats (liver, kidney). What side effects may I notice from receiving this medicine? Side effects that you should report to your doctor or health care professional as soon as possible: -allergic reactions like skin rash, itching or hives, swelling of the face, lips, or tongue -breathing problems -changes in blood pressure -feeling faint or lightheaded, falls -fever or chills -flushing, sweating, or hot feelings -swelling of the ankles or feet Side effects that usually do not require medical attention (Report these to your doctor or health care professional if they continue or are bothersome.): -diarrhea -headache -nausea, vomiting -stomach pain This list may not describe all possible side effects. Call your doctor for medical advice about side effects. You may report side effects to FDA at 1-800-FDA-1088. Where should I keep my medicine? This drug is given in a hospital or clinic and will not be stored at home. NOTE: This sheet is a summary. It may not cover all possible information. If you have questions about this medicine, talk to your doctor, pharmacist, or health care provider.  2012, [employer]vier/[employer] Standard. (09/11/2007 9:48:25 PM) 

## 2012-09-20 NOTE — Telephone Encounter (Signed)
New Problem ° °Pt returning call for results °

## 2012-09-22 ENCOUNTER — Other Ambulatory Visit: Payer: Self-pay | Admitting: Physical Medicine and Rehabilitation

## 2012-09-27 ENCOUNTER — Ambulatory Visit (HOSPITAL_BASED_OUTPATIENT_CLINIC_OR_DEPARTMENT_OTHER): Payer: Medicare Other

## 2012-09-27 DIAGNOSIS — D509 Iron deficiency anemia, unspecified: Secondary | ICD-10-CM

## 2012-09-27 MED ORDER — FERUMOXYTOL INJECTION 510 MG/17 ML
510.0000 mg | Freq: Once | INTRAVENOUS | Status: AC
  Administered 2012-09-27: 510 mg via INTRAVENOUS
  Filled 2012-09-27: qty 17

## 2012-09-27 MED ORDER — SODIUM CHLORIDE 0.9 % IV SOLN
Freq: Once | INTRAVENOUS | Status: AC
  Administered 2012-09-27: 13:00:00 via INTRAVENOUS

## 2012-09-27 NOTE — Patient Instructions (Addendum)
Ferumoxytol injection What is this medicine? FERUMOXYTOL is an iron complex. Iron is used to make healthy red blood cells, which carry oxygen and nutrients throughout the body. This medicine is used to treat iron deficiency anemia in people with chronic kidney disease. This medicine may be used for other purposes; ask your health care provider or pharmacist if you have questions. What should I tell my health care provider before I take this medicine? They need to know if you have any of these conditions: -anemia not caused by low iron levels -high levels of iron in the blood -magnetic resonance imaging (MRI) test scheduled -an unusual or allergic reaction to iron, other medicines, foods, dyes, or preservatives -pregnant or trying to get pregnant -breast-feeding How should I use this medicine? This medicine is for infusion into a vein. It is given by a health care professional in a hospital or clinic setting. Talk to your pediatrician regarding the use of this medicine in children. Special care may be needed. Overdosage: If you think you've taken too much of this medicine contact a poison control center or emergency room at once. Overdosage: If you think you have taken too much of this medicine contact a poison control center or emergency room at once. NOTE: This medicine is only for you. Do not share this medicine with others. What if I miss a dose? It is important not to miss your dose. Call your doctor or health care professional if you are unable to keep an appointment. What may interact with this medicine? This medicine may interact with the following medications: -other iron products This list may not describe all possible interactions. Give your health care provider a list of all the medicines, herbs, non-prescription drugs, or dietary supplements you use. Also tell them if you smoke, drink alcohol, or use illegal drugs. Some items may interact with your medicine. What should I watch  for while using this medicine? Visit your doctor or healthcare professional regularly. Tell your doctor or healthcare professional if your symptoms do not start to get better or if they get worse. You may need blood work done while you are taking this medicine. You may need to follow a special diet. Talk to your doctor. Foods that contain iron include: whole grains/cereals, dried fruits, beans, or peas, leafy green vegetables, and organ meats (liver, kidney). What side effects may I notice from receiving this medicine? Side effects that you should report to your doctor or health care professional as soon as possible: -allergic reactions like skin rash, itching or hives, swelling of the face, lips, or tongue -breathing problems -changes in blood pressure -feeling faint or lightheaded, falls -fever or chills -flushing, sweating, or hot feelings -swelling of the ankles or feet Side effects that usually do not require medical attention (Report these to your doctor or health care professional if they continue or are bothersome.): -diarrhea -headache -nausea, vomiting -stomach pain This list may not describe all possible side effects. Call your doctor for medical advice about side effects. You may report side effects to FDA at 1-800-FDA-1088. Where should I keep my medicine? This drug is given in a hospital or clinic and will not be stored at home. NOTE: This sheet is a summary. It may not cover all possible information. If you have questions about this medicine, talk to your doctor, pharmacist, or health care provider.  2012, Elsevier/Gold Standard. (09/11/2007 9:48:25 PM) 

## 2012-10-02 ENCOUNTER — Telehealth: Payer: Self-pay | Admitting: Cardiology

## 2012-10-02 NOTE — Telephone Encounter (Signed)
Pt was called/ states she has a bit of a belly ache/ 5lb wt gain/ has not moved her bowels for 3-4 days. Pt forgot to take miralax daily. Denies sob, no edema.  Pt was told to avoid meat and cheese for now, take miralax daily, increase fresh fruit, may try suppositories or fluid enemas-OTC. Told her to call pcp for further advise. Told her to call if she has change in symptoms. [facility] was updated with advise.  All agreed to plan.

## 2012-10-02 NOTE — Telephone Encounter (Signed)
New Problem  Pt has a 5 lb weight gain from 9/30 to 10/1// issue is that the bowels have not moved.. took Murelax this morning.[name] offered a skill nursing visit// denied visit but is scheduled to be seen by the nurse 10/03/12. Pt denied Edema and SOB from survey.

## 2012-10-03 ENCOUNTER — Telehealth: Payer: Self-pay | Admitting: *Deleted

## 2012-10-03 NOTE — Telephone Encounter (Signed)
Error Debbie Roxie Gueye RN  

## 2012-10-04 ENCOUNTER — Other Ambulatory Visit: Payer: Self-pay | Admitting: Internal Medicine

## 2012-10-04 ENCOUNTER — Ambulatory Visit
Admission: RE | Admit: 2012-10-04 | Discharge: 2012-10-04 | Disposition: A | Discharge status: Home / Self Care | Payer: BC Managed Care – PPO | Source: Ambulatory Visit | Attending: Internal Medicine | Admitting: Internal Medicine

## 2012-10-04 DIAGNOSIS — R928 Other abnormal and inconclusive findings on diagnostic imaging of breast: Secondary | ICD-10-CM

## 2012-10-14 ENCOUNTER — Ambulatory Visit: Payer: Medicare Other | Admitting: Gastroenterology

## 2012-10-15 ENCOUNTER — Telehealth: Payer: Self-pay | Admitting: Cardiology

## 2012-10-15 NOTE — Telephone Encounter (Signed)
New problem:  [name] states the patient has lost 5 lbs since Friday. Pt went from 150 lbs to 145 lbs. [name] was calling to let us know.

## 2012-10-15 NOTE — Telephone Encounter (Signed)
Per [facility] - needed to report [name] has had a 5 lb wt loss.  Pt denies any s/s and states she "feels fine".  She also reports she has an appointment with her GI MD to discuss recent bouts of constipation.  She will call back if any problems

## 2012-10-16 ENCOUNTER — Encounter: Payer: Self-pay | Admitting: Physician Assistant

## 2012-10-17 NOTE — Telephone Encounter (Signed)
Our office is unsure if you have a question in regards to receiving flu shot.  Please resend your message with more information.   Leotis Shames

## 2012-10-18 NOTE — Telephone Encounter (Signed)
New Problem  Faxed over plan of care 9/22 with no response// Please complete an fax back// will resend fax

## 2012-10-25 ENCOUNTER — Telehealth: Payer: Self-pay | Admitting: *Deleted

## 2012-10-25 NOTE — Telephone Encounter (Signed)
Tiffany from [facility] called to request status update regarding previously faxed orders request on patient from August, 2014. Tiffany states order request faxes previously sent on 9/22 and 10/17 for: 1) Physical therapy and goals and 2) edema control issues.

## 2012-10-25 NOTE — Telephone Encounter (Signed)
Routed to [name] and Avie Arenas, RN, for follow up.

## 2012-10-28 ENCOUNTER — Telehealth: Payer: Self-pay | Admitting: *Deleted

## 2012-10-28 NOTE — Telephone Encounter (Signed)
Per Velna Hatchet - pt reports feeling fine just tired - wt today is 147.2 down to 143.2 lbs (down 4 lbs overnight) she also reports her appetite is not that good.  BP has been 145/94 down to 122/76 after re-check.  She will continue to call with further changes or issues.

## 2012-10-28 NOTE — Telephone Encounter (Signed)
Please see next phone note

## 2012-10-28 NOTE — Telephone Encounter (Signed)
Dr Antoine Poche has paperwork however is not in the office this week.  I will fax information as soon as I get it back

## 2012-10-29 ENCOUNTER — Telehealth: Payer: Self-pay | Admitting: Internal Medicine

## 2012-10-29 ENCOUNTER — Telehealth: Payer: Self-pay | Admitting: *Deleted

## 2012-10-29 NOTE — Telephone Encounter (Signed)
[name] from [facility] called.  [name] is a new pt to her today.  Her weight is 144.  [name] needs to know what her weight range should be.  Pt is complaining of weakness.  She has tightness in her legs, +2-+3 pitting edema.  She will try to find some to order a CBCsince [name] is out of the office this afternoon.  She wants to know who will be signing her orders, [name] or [name]?

## 2012-10-29 NOTE — Telephone Encounter (Signed)
Amy, RN with [facility] called to report pt is not feeling well and she would like to draw blood as the pt states this is how she felt before when she had to have a transfusion.  Advised Dr Antoine Poche is not in the office this week and I can not give her orders for a blood draw.  Of note the pt has an appointment with Dr Yetta Barre in the am.  It makes the most sense to me than Dr Yetta Barre order what bloodwork the pt needs based on his evaluation so that she doesn't have to be stuck twice.  Amy is aware and states understanding.

## 2012-10-30 ENCOUNTER — Other Ambulatory Visit (INDEPENDENT_AMBULATORY_CARE_PROVIDER_SITE_OTHER): Payer: Medicare Other

## 2012-10-30 ENCOUNTER — Encounter: Payer: Self-pay | Admitting: Internal Medicine

## 2012-10-30 ENCOUNTER — Ambulatory Visit (INDEPENDENT_AMBULATORY_CARE_PROVIDER_SITE_OTHER): Payer: Medicare Other | Admitting: Internal Medicine

## 2012-10-30 VITALS — BP 128/68 | HR 105 | Temp 98.5°F | Resp 16 | Ht 69.0 in | Wt 150.0 lb

## 2012-10-30 DIAGNOSIS — E1129 Type 2 diabetes mellitus with other diabetic kidney complication: Secondary | ICD-10-CM

## 2012-10-30 DIAGNOSIS — Z23 Encounter for immunization: Secondary | ICD-10-CM

## 2012-10-30 DIAGNOSIS — IMO0002 Reserved for concepts with insufficient information to code with codable children: Secondary | ICD-10-CM

## 2012-10-30 DIAGNOSIS — D509 Iron deficiency anemia, unspecified: Secondary | ICD-10-CM

## 2012-10-30 DIAGNOSIS — F0390 Unspecified dementia without behavioral disturbance: Secondary | ICD-10-CM

## 2012-10-30 DIAGNOSIS — I10 Essential (primary) hypertension: Secondary | ICD-10-CM

## 2012-10-30 DIAGNOSIS — I509 Heart failure, unspecified: Secondary | ICD-10-CM

## 2012-10-30 DIAGNOSIS — I5022 Chronic systolic (congestive) heart failure: Secondary | ICD-10-CM

## 2012-10-30 DIAGNOSIS — J449 Chronic obstructive pulmonary disease, unspecified: Secondary | ICD-10-CM

## 2012-10-30 DIAGNOSIS — R0902 Hypoxemia: Secondary | ICD-10-CM

## 2012-10-30 DIAGNOSIS — I5023 Acute on chronic systolic (congestive) heart failure: Secondary | ICD-10-CM

## 2012-10-30 LAB — CBC WITH DIFFERENTIAL/PLATELET
Basophils Absolute: 0 10*3/uL (ref 0.0–0.1)
Eosinophils Absolute: 0.1 10*3/uL (ref 0.0–0.7)
Eosinophils Relative: 2 % (ref 0.0–5.0)
Lymphocytes Relative: 19.3 % (ref 12.0–46.0)
Lymphs Abs: 0.7 10*3/uL (ref 0.7–4.0)
MCHC: 33.7 g/dL (ref 30.0–36.0)
Monocytes Absolute: 0.2 10*3/uL (ref 0.1–1.0)
Neutrophils Relative %: 73.9 % (ref 43.0–77.0)
Platelets: 259 10*3/uL (ref 150.0–400.0)
RBC: 3.43 Mil/uL — ABNORMAL LOW (ref 3.87–5.11)
RDW: 16.8 % — ABNORMAL HIGH (ref 11.5–14.6)
WBC: 3.8 10*3/uL — ABNORMAL LOW (ref 4.5–10.5)

## 2012-10-30 LAB — FERRITIN: Ferritin: 394.9 ng/mL — ABNORMAL HIGH (ref 10.0–291.0)

## 2012-10-30 LAB — BASIC METABOLIC PANEL
Chloride: 85 mEq/L — ABNORMAL LOW (ref 96–112)
Creatinine, Ser: 1.2 mg/dL (ref 0.4–1.2)
GFR: 59.02 mL/min — ABNORMAL LOW (ref >60.00)
Potassium: 4.1 mEq/L (ref 3.5–5.1)
Sodium: 129 mEq/L — ABNORMAL LOW (ref 135–145)

## 2012-10-30 LAB — IBC PANEL
Iron: 28 ug/dL — ABNORMAL LOW (ref 42–145)
Saturation Ratios: 10.4 % — ABNORMAL LOW (ref 20.0–50.0)

## 2012-10-30 MED ORDER — FERRAPLUS 90 90-1 MG PO TABS: 1.0000 | ORAL_TABLET | Freq: Every day | ORAL | Status: AC

## 2012-10-30 MED ORDER — LOSARTAN POTASSIUM 50 MG PO TABS: 50.0000 mg | ORAL_TABLET | Freq: Every day | ORAL | Status: DC

## 2012-10-30 NOTE — Assessment & Plan Note (Addendum)
She appears to be at a late stage with this although her volume status is pretty good today The software blocked my order for a BNP today I have asked hospice to evaluate her to see if she is a candidate for more supportive care then [facility] is currently offering

## 2012-10-30 NOTE — Progress Notes (Signed)
Subjective:    Patient ID: [name], female    DOB: 11/25/1945, 67 y.o.   MRN: [id number]  Anemia Presents for follow-up visit. Symptoms include confusion, malaise/fatigue and weight loss. There has been no abdominal pain, anorexia, bruising/bleeding easily, fever, leg swelling, light-headedness, pallor, palpitations, paresthesias or pica. Signs of blood loss that are not present include hematemesis, hematochezia, melena and vaginal bleeding. Past treatments include parenteral vitamin B12 and oral iron supplements. Past medical history includes dementia, heart failure and recent illness. There is no history of recent surgery or recent trauma. Procedure history includes colonoscopy. Family history includes iron deficiency. Compliance problems include psychosocial issues.       Review of Systems  Constitutional: Positive for weight loss, malaise/fatigue, fatigue and unexpected weight change (some weight loss). Negative for fever, chills, diaphoresis, activity change and appetite change.  HENT: Negative.   Eyes: Negative.   Respiratory: Positive for shortness of breath. Negative for apnea, cough, choking and chest tightness.   Cardiovascular: Negative.  Negative for chest pain, palpitations and leg swelling.  Gastrointestinal: Negative.  Negative for nausea, vomiting, abdominal pain, diarrhea, constipation, blood in stool, melena, hematochezia, anal bleeding, anorexia and hematemesis.  Endocrine: Negative.  Negative for polydipsia, polyphagia and polyuria.  Genitourinary: Negative.  Negative for frequency, hematuria, flank pain and vaginal bleeding.  Musculoskeletal: Negative.  Negative for arthralgias, back pain, gait problem, joint swelling, myalgias, neck pain and neck stiffness.  Skin: Negative.  Negative for color change, pallor, rash and wound.  Allergic/Immunologic: Negative.   Neurological: Negative.  Negative for dizziness, tremors, weakness, light-headedness, numbness and  paresthesias.  Hematological: Negative.  Negative for adenopathy. Does not bruise/bleed easily.  Psychiatric/Behavioral: Positive for confusion, sleep disturbance, dysphoric mood and decreased concentration. Negative for suicidal ideas, hallucinations, behavioral problems, self-injury and agitation. The patient is nervous/anxious. The patient is not hyperactive.        Objective:   Physical Exam  Vitals reviewed. Constitutional: She is oriented to person, place, and time. She appears cachectic.  Non-toxic appearance. She has a sickly appearance. She appears ill. No distress.  HENT:  Mouth/Throat: Oropharynx is clear and moist. No oropharyngeal exudate.  Eyes: Conjunctivae are normal. Right eye exhibits no discharge. Left eye exhibits no discharge. No scleral icterus.  Neck: Normal range of motion. Neck supple. No JVD present. No tracheal deviation present. No thyromegaly present.  Cardiovascular: Normal rate, regular rhythm, normal heart sounds and intact distal pulses.  Exam reveals no gallop and no friction rub.   No murmur heard. Pulmonary/Chest: Effort normal and breath sounds normal. No stridor. No respiratory distress. She has no wheezes. She has no rales. She exhibits no tenderness.  Abdominal: Soft. Bowel sounds are normal. She exhibits no distension and no mass. There is no tenderness. There is no rebound and no guarding.  Musculoskeletal: Normal range of motion. She exhibits edema (1+ pitting edema over both ankles). She exhibits no tenderness.  Lymphadenopathy:    She has no cervical adenopathy.  Neurological: She is oriented to person, place, and time.  Skin: Skin is warm and dry. No rash noted. She is not diaphoretic. No erythema. No pallor.  Psychiatric: Judgment and thought content normal. Her mood appears not anxious. Her affect is not angry, not blunt, not labile and not inappropriate. Her speech is delayed. Her speech is not rapid and/or pressured, not tangential and not  slurred. She is slowed. She is not agitated, not aggressive, not hyperactive, not withdrawn, not actively hallucinating and not combative. Cognition and  memory are normal. She exhibits a depressed mood. She is communicative. She is attentive.     Lab Results  Component Value Date   WBC 4.5 09/17/2012   HGB 9.6* 09/17/2012   HCT 29.1* 09/17/2012   PLT 307 09/17/2012   GLUCOSE 126* 09/17/2012   CHOL 125 08/22/2012   TRIG 65.0 08/22/2012   HDL 50.30 08/22/2012   LDLCALC 62 08/22/2012   ALT 7 04/16/2012   AST 13 04/16/2012   NA 129* 09/17/2012   K 3.9 09/17/2012   CL 86* 09/17/2012   CREATININE 1.2 09/17/2012   BUN 16 09/17/2012   CO2 35* 09/17/2012   TSH 2.357 04/23/2012   INR 1.00 04/28/2012   HGBA1C 6.4 08/22/2012   MICROALBUR 2.45* 03/15/2007       Assessment & Plan:

## 2012-10-30 NOTE — Assessment & Plan Note (Signed)
She tells me that benicar is too expensive so I have asked her to change to losartan Today I will check her lytes and renal function

## 2012-10-30 NOTE — Assessment & Plan Note (Addendum)
I will recheck his CBC and iron level today The software blocked me from ordering a B12, folate, or ferritin level today  Late note - CBC is stable but the iron level is low so I have asked her to start ferraplus

## 2012-10-30 NOTE — Assessment & Plan Note (Signed)
She will cont her current inhaler regimen She has a lot of SOB but no other COPD symptoms so no changes were made today

## 2012-10-30 NOTE — Assessment & Plan Note (Signed)
I don't think she would benefit from the usual meds used to treat this (not much efficacy and concerns for side effects) She may benefit from Hospice involvement

## 2012-10-30 NOTE — Addendum Note (Signed)
Addended by: [name] on: 10/30/2012 01:39 PM   Modules accepted: Orders

## 2012-10-30 NOTE — Patient Instructions (Signed)
Anemia, Nonspecific  Your exam and blood tests show you are anemic. This means your blood (hemoglobin) level is low. Normal hemoglobin values are 12 to 15 g/dL for females and 14 to 17 g/dL for males. Make a note of your hemoglobin level today. The hematocrit percent is also used to measure anemia. A normal hematocrit is 38% to 46% in females and 42% to 49% in males. Make a note of your hematocrit level today.  CAUSES   Anemia can be due to many different causes.   Excessive bleeding from periods (in women).   Intestinal bleeding.   Poor nutrition.   Kidney, thyroid, liver, and bone marrow diseases.  SYMPTOMS   Anemia can come on suddenly (acute). It can also come on slowly. Symptoms can include:   Minor weakness.   Dizziness.   Palpitations.   Shortness of breath.  Symptoms may be absent until half your hemoglobin is missing if it comes on slowly. Anemia due to acute blood loss from an injury or internal bleeding may require blood transfusion if the loss is severe. Hospital care is needed if you are anemic and there is significant continual blood loss.  TREATMENT    Stool tests for blood (Hemoccult) and additional lab tests are often needed. This determines the best treatment.   Further checking on your condition and your response to treatment is very important. It often takes many weeks to correct anemia.  Depending on the cause, treatment can include:   Supplements of iron.   Vitamins B12 and folic acid.   Hormone medicines.  If your anemia is due to bleeding, finding the cause of the blood loss is very important. This will help avoid further problems.  SEEK IMMEDIATE MEDICAL CARE IF:    You develop fainting, extreme weakness, shortness of breath, or chest pain.   You develop heavy vaginal bleeding.   You develop bloody or black, tarry stools or vomit up blood.   You develop a high fever, rash, repeated vomiting, or dehydration.  Document Released: 01/27/2004 Document Revised: 03/13/2011 Document  Reviewed: 11/03/2008  Exit[employer] Patient Information 2014 [employer].

## 2012-10-31 ENCOUNTER — Telehealth: Payer: Self-pay | Admitting: Internal Medicine

## 2012-10-31 NOTE — Telephone Encounter (Signed)
10/31/2012  Amy, [facility], [phone], please contact you to give advice or guidance on orders.  Please contact as soon as possible.

## 2012-10-31 NOTE — Telephone Encounter (Signed)
Spoke with Amy,RN who requested order to go out for nursing and PT until hospice comes in. Verbal orders given

## 2012-11-01 ENCOUNTER — Ambulatory Visit
Admission: RE | Admit: 2012-11-01 | Discharge: 2012-11-01 | Disposition: A | Discharge status: Home / Self Care | Payer: Medicare Other | Source: Ambulatory Visit | Attending: Internal Medicine | Admitting: Internal Medicine

## 2012-11-01 ENCOUNTER — Other Ambulatory Visit: Payer: Self-pay | Admitting: Internal Medicine

## 2012-11-01 ENCOUNTER — Telehealth: Payer: Self-pay | Admitting: Cardiology

## 2012-11-01 DIAGNOSIS — R928 Other abnormal and inconclusive findings on diagnostic imaging of breast: Secondary | ICD-10-CM

## 2012-11-01 NOTE — Telephone Encounter (Signed)
Tiffany from [facility] called requesting outstanding forms be faxed.  Advised that [name] has been out of office all week and will be back on Monday 11/3.  Will be faxed when he has signed.

## 2012-11-01 NOTE — Telephone Encounter (Signed)
New message    Have not received orders faxed to do.  They have been outstanding since aug.  Tiffany's ext is [phone]

## 2012-11-05 DIAGNOSIS — I2510 Atherosclerotic heart disease of native coronary artery without angina pectoris: Secondary | ICD-10-CM

## 2012-11-05 DIAGNOSIS — I10 Essential (primary) hypertension: Secondary | ICD-10-CM

## 2012-11-05 DIAGNOSIS — R634 Abnormal weight loss: Secondary | ICD-10-CM

## 2012-11-05 DIAGNOSIS — E1159 Type 2 diabetes mellitus with other circulatory complications: Secondary | ICD-10-CM

## 2012-11-05 DIAGNOSIS — R55 Syncope and collapse: Secondary | ICD-10-CM

## 2012-11-05 DIAGNOSIS — E785 Hyperlipidemia, unspecified: Secondary | ICD-10-CM

## 2012-11-07 ENCOUNTER — Ambulatory Visit
Admission: RE | Admit: 2012-11-07 | Discharge: 2012-11-07 | Disposition: A | Discharge status: Home / Self Care | Payer: Medicare Other | Source: Ambulatory Visit | Attending: Internal Medicine | Admitting: Internal Medicine

## 2012-11-07 DIAGNOSIS — R928 Other abnormal and inconclusive findings on diagnostic imaging of breast: Secondary | ICD-10-CM

## 2012-11-11 ENCOUNTER — Encounter: Payer: Medicare Other | Admitting: Physical Medicine and Rehabilitation

## 2012-11-11 ENCOUNTER — Telehealth: Payer: Self-pay | Admitting: *Deleted

## 2012-11-11 DIAGNOSIS — M545 Low back pain: Secondary | ICD-10-CM

## 2012-11-11 MED ORDER — FENTANYL 25 MCG/HR TD PT72: 25.0000 ug | MEDICATED_PATCH | TRANSDERMAL | Status: DC

## 2012-11-11 NOTE — Telephone Encounter (Signed)
I would like to know what they think is the appropriate thing to do in the setting of a hospice patient

## 2012-11-11 NOTE — Telephone Encounter (Signed)
Teress states pt is clammy, nauseated, complains of pain all over.  Requesting medication for nausea and generalized pain.  Please advise

## 2012-11-11 NOTE — Telephone Encounter (Signed)
Try duragesic patch When will hospice doctor see, evaluate, treat patient?

## 2012-11-11 NOTE — Telephone Encounter (Signed)
Is there a hospice physician involved?

## 2012-11-11 NOTE — Telephone Encounter (Signed)
[name] is asking for symptom management for today, until Hospice MD can see pt.  Further states pt did not understand what Hospice was about, that they are comfort care instead of aggressive curing care.  Please advise

## 2012-11-11 NOTE — Telephone Encounter (Signed)
There is one appointed to the pt however he has not seen her.

## 2012-11-12 NOTE — Telephone Encounter (Signed)
[name] called states she spoke with Dr Gibson Ramp at [facility], he increased pts Hydroxyzine to 10mg  QID, Tramadol 1-2 every 6 hours prn and Compazine 10mg  every 6 hours prn for nausea.  Pt reports today that she feels much better.

## 2012-11-18 ENCOUNTER — Encounter: Payer: Self-pay | Admitting: Nurse Practitioner

## 2012-11-18 ENCOUNTER — Ambulatory Visit (INDEPENDENT_AMBULATORY_CARE_PROVIDER_SITE_OTHER): Admitting: Nurse Practitioner

## 2012-11-18 VITALS — BP 120/70 | HR 88 | Ht 69.0 in | Wt 153.4 lb

## 2012-11-18 DIAGNOSIS — I10 Essential (primary) hypertension: Secondary | ICD-10-CM

## 2012-11-18 DIAGNOSIS — I509 Heart failure, unspecified: Secondary | ICD-10-CM

## 2012-11-18 DIAGNOSIS — R0609 Other forms of dyspnea: Secondary | ICD-10-CM

## 2012-11-18 DIAGNOSIS — I5022 Chronic systolic (congestive) heart failure: Secondary | ICD-10-CM

## 2012-11-18 DIAGNOSIS — R0600 Dyspnea, unspecified: Secondary | ICD-10-CM

## 2012-11-18 DIAGNOSIS — I2510 Atherosclerotic heart disease of native coronary artery without angina pectoris: Secondary | ICD-10-CM

## 2012-11-18 DIAGNOSIS — I5023 Acute on chronic systolic (congestive) heart failure: Secondary | ICD-10-CM

## 2012-11-18 MED ORDER — SPIRONOLACTONE 25 MG PO TABS: 25.0000 mg | ORAL_TABLET | Freq: Every day | ORAL | Status: DC

## 2012-11-18 MED ORDER — POTASSIUM CHLORIDE ER 10 MEQ PO CPCR: ORAL_CAPSULE | ORAL | Status: DC

## 2012-11-18 NOTE — Telephone Encounter (Signed)
Left message of appt on both pt and [facility] voicemail

## 2012-11-18 NOTE — Telephone Encounter (Signed)
Pt aware of appt and was seen today at 3 pm by Norma Fredrickson

## 2012-11-18 NOTE — Telephone Encounter (Signed)
New problem   Pt is having increase fluid lower leg and left thigh. Would like pt to be seen asap. Please advise

## 2012-11-18 NOTE — Patient Instructions (Signed)
Start Aldactone 25 mg a day - this is at the drug store.  Cut the potassium back to 2 tablets twice a day and 3 tablets twice a day on the days you use zaroxolyn  Check labs today   Check lab in one week  Have the Hospice nurse call and give [name] an update (she is Dr. Jenene Slicker nurse)  Ask the Hospice nurse to get you scales to weigh on   Call the [facility] office at [phone] if you have any questions, problems or concerns.

## 2012-11-18 NOTE — Progress Notes (Signed)
[name] Date of Birth: [birth year] Medical Record #[id number]  History of Present Illness: [name] is seen today for a work in visit. Seen for [name]. She is a 67 year old female. She has a cardiomyopathy. EF of 45% per last echo in April of 2014. Known coronary disease per prior cath - demonstrated left main 20% stenosis. The distal LAD was very small and diseased with apical 99% stenosis unchanged. Circumflex had some moderate plaque. There was a mid stent in the right coronary that was patent. The distal stent had some mild restenosis. There was some distal PDA 99% stenosis but this was a very small caliber vessel. She ha been managed medically.   [name] other issues include chronic edema, iron deficiency anemia, past syncope from beta blockers, DM, HLD, obesity, depression, HTN, CAD with NSTEMI in 2002 with RCA stent, GERD, diverticulosis, OSA, tobacco abuse, moderate COPD, and anxiety.    Comes in today. Here with [name] husband. She is now on Hospice - was placed on Hospice about 2 weeks ago. Here today with continued swelling in [name] legs. No scales at home. Sounds like she probably gets too much salt. No chest pain. Breathing is ok.. On high dose Lasix and Zaroxolyn as well. Not on aldactone. Feels bloated. Wants to sleep all the time. Weight is actually down a pound.   Current Outpatient Prescriptions  Medication Sig Dispense Refill  . acetaminophen (TYLENOL) 500 MG tablet Take 500 mg by mouth every 6 (six) hours as needed for pain.      [name] albuterol (PROVENTIL) (2.5 MG/3ML) 0.083% nebulizer solution Take 2.5 mg by nebulization every 6 (six) hours as needed for wheezing.      [name] aspirin 81 MG tablet Take 81 mg by mouth daily.        [name] atorvastatin (LIPITOR) 40 MG tablet Take 40 mg by mouth every morning.      . diclofenac sodium (VOLTAREN) 1 % GEL Apply 2 g topically 4 (four) times daily.  2 Tube  2  . feeding supplement (GLUCERNA SHAKE) LIQD Take 237 mLs by mouth as needed.        . furosemide (LASIX) 80 MG tablet TAKE 1.5 TABLETS TWICE A DAY  90 tablet  3  . gabapentin (NEURONTIN) 300 MG capsule Take 1 capsule (300 mg total) by mouth 2 (two) times daily.  60 capsule  5  . hydrALAZINE (APRESOLINE) 50 MG tablet Take 50 mg by mouth every 8 (eight) hours.      . hydrOXYzine (ATARAX/VISTARIL) 10 MG tablet Take 10 mg by mouth 2 (two) times daily.      . Iron-Folic Acid-B12-C-Docusate (FERRAPLUS 90) 90-1 MG TABS Take 1 tablet by mouth daily.  90 tablet  3  . isosorbide mononitrate (IMDUR) 60 MG 24 hr tablet Take 60 mg by mouth daily at 12 noon.      [employer] (SIMPLE DIAGNOSTICS LANCING DEV) MISC       . losartan (COZAAR) 50 MG tablet Take 1 tablet (50 mg total) by mouth daily.  90 tablet  3  . metolazone (ZAROXOLYN) 2.5 MG tablet Take 1 tablet (2.5 mg total) by mouth 3 (three) times a week.  12 tablet  6  . nitroGLYCERIN (NITROSTAT) 0.4 MG SL tablet Place 1 tablet (0.4 mg total) under the tongue every 5 (five) minutes x 3 doses as needed for chest pain.  25 tablet  3  . omeprazole (PRILOSEC) 40 MG capsule Take 40 mg by  mouth daily.      . polyethylene glycol (MIRALAX / GLYCOLAX) packet Take 17 g by mouth daily as needed (for constipation).      . potassium chloride (MICRO-K) 10 MEQ CR capsule TAKE 3 TABLETS TWICE A DAY  INCREASE POTASSIUM TO 4 TABS BID ON DAYS TAKES  ZAROXOLYN  180 capsule  3  . sitaGLIPtan-metformin (JANUMET) 50-500 MG per tablet Take 1 tablet by mouth 2 (two) times daily with a meal.      . SPIRIVA HANDIHALER 18 MCG inhalation capsule PLACE 1 CAPSULE INTO INHALER AND INHALE DAILY  30 capsule  1  . traZODone (DESYREL) 100 MG tablet Take 150 mg by mouth at bedtime.      [name] venlafaxine XR (EFFEXOR-XR) 150 MG 24 hr capsule Take 150 mg by mouth every morning.      . [DISCONTINUED] budesonide-formoterol (SYMBICORT) 80-4.5 MCG/ACT inhaler Inhale 2 puffs into the lungs 2 (two) times daily.  1 Inhaler  12   No current facility-administered medications for  this visit.    Allergies  Allergen Reactions  . Amlodipine Other (See Comments)    constipation  . Ramipril Cough  . Adhesive [Tape] Hives    "peels skin off"  . Penicillins Itching  . Codeine Rash    Past Medical History  Diagnosis Date  . DM   . DYSLIPIDEMIA   . OBESITY NOS   . DEPRESSION, RECURRENT, IN PARTIAL REMISSION   . HYPERTENSION   . 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 CONGESTIVE HEART FAILURE     EF 40-45% echo 05/2011  . GERD   . DIVERTICULOSIS, COLON, WITH HEMORRHAGE   . BACK PAIN, LUMBAR, WITH RADICULOPATHY   . OBSTRUCTIVE SLEEP APNEA   . Asthma   . Anemia, iron deficiency   . Hyponatremia 12/2011  . Tobacco abuse   . Moderate COPD (chronic obstructive pulmonary disease) 11/07/2005    Spirometry 03/09/11>>FEV1 2.02 (88%), FEV1% 76    . Anxiety   . Arthritis   . Blood transfusion without reported diagnosis 1970's  . Cataract   . Myocardial infarction 2001    Past Surgical History  Procedure Laterality Date  . Uvuloplasty    . Total abdominal hysterectomy w/ bilateral salpingoophorectomy    . Abdominal hysterectomy    . Cholecystectomy  2004  . Coronary angioplasty with stent placement  2002    last 04-29-12  . Esophagogastroduodenoscopy (egd) with propofol N/A 06/06/2012    Procedure: ESOPHAGOGASTRODUODENOSCOPY (EGD) WITH PROPOFOL;  Surgeon: Rachael Fee, MD;  Location: [facility] END[facility]COPY;  Service: Endoscopy;  Laterality: N/A;    History  Smoking status  . Former Smoker -- 0.25 packs/day for 20 years  . Types: Cigarettes  . Quit date: 07/17/2012  Smokeless tobacco  . Never Used    Comment: Still smokes a little.    History  Alcohol Use No    Comment: Patient drinks cafinated daily.    Family History  Problem Relation Age of Onset  . Stroke Mother   . Breast cancer Sister   . Emphysema Sister   .  Cancer Sister     breast  . Alcohol abuse Brother   . Colon cancer      uncles x 2  . Prostate cancer Brother     Review of Systems: The review of systems is  per the HPI.  All other systems were reviewed and are negative.  Physical Exam: BP 120/70  Pulse 88  Ht 5\' 9"  (1.753 m)  Wt 153 lb 6.4 oz (69.582 kg)  BMI 22.64 kg/m2 Patient is very pleasant and in no acute distress. Weight is down a pound. Skin is warm and dry. Color is normal.  HEENT is unremarkable. Normocephalic/atraumatic. PERRL. Sclera are nonicteric. Neck is supple. No masses. No JVD. Lungs are clear. Cardiac exam shows a regular rate and rhythm. +S3. Abdomen is soft. Extremities are with 1 to 2+ edema. Gait not tested. Using a cane.  No gross neurologic deficits noted.  LABORATORY DATA: PENDING  Lab Results  Component Value Date   WBC 3.8* 10/30/2012   HGB 10.6* 10/30/2012   HCT 31.5* 10/30/2012   PLT 259.0 10/30/2012   GLUCOSE 102* 10/30/2012   CHOL 125 08/22/2012   TRIG 65.0 08/22/2012   HDL 50.30 08/22/2012   LDLCALC 62 08/22/2012   ALT 7 04/16/2012   AST 13 04/16/2012   NA 129* 10/30/2012   K 4.1 10/30/2012   CL 85* 10/30/2012   CREATININE 1.2 10/30/2012   BUN 18 10/30/2012   CO2 33* 10/30/2012   TSH 2.357 04/23/2012   INR 1.00 04/28/2012   HGBA1C 6.4 08/22/2012   MICROALBUR 2.45* 03/15/2007    Echo Study Conclusions from April 2014  - Left ventricle: The cavity size was mildly dilated. Wall thickness was increased in a pattern of mild LVH. Systolic function was mildly to moderately reduced. The estimated ejection fraction was in the range of 40% to 45%. Wall motion was normal; there were no regional wall motion abnormalities. - Left atrium: The atrium was mildly dilated. - Right atrium: The atrium was mildly to moderately dilated. - Pulmonary arteries: Systolic pressure was moderately increased.  Angiographic Findings from April 2014:  Left main: 20% plaque.  Left Anterior Descending Artery: Large  caliber vessel that courses to the apex. The proximal and mid vessel has mild plaque disease. The distal vessel becomes very small in caliber and has diffuse disease. There appears to be a focal 99% stenosis but this is in the very distal, smaller segment as it approaches the apex. This is too small for PCI. Unchanged from cath in 2010 and 2013.  Circumflex Artery: Moderate sized vessel with mild plaque in mid vessel.  Right Coronary Artery: Large dominant vessel with patent stents in the mid and distal vessel. There is a 30% stenosis in the proximal vessel just before the stent. The mid stent is patent with mild restenosis. The mid vessel has a focal 40-50% stenosis. The distal stent is patent with mild in stent restenosis. The PL branch and PDA are small to moderate sized, long branches. The PDA becomes very small caliber and has serial 99% stenoses in this very small caliber segment of the vessel. (1.0 mm). The posterolateral branches have mild plaque disease.  Left Ventricular Angiogram: LVEF=20% with global hypokinesis.  Impression:  1. 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  2. Moderate to severe global LV systolic dysfunction.  Recommendations: Continue medical management of CAD. No focal targets for PCI.  Complications: None. The patient tolerated the procedure well.    Assessment / Plan: 1. Systolic HF - now on Hospice services - does not have scales at home - need to check labs today to include BNP. Add Aldactone 25 mg a day. I have cut [name] potassium  back. Needs to cut back the salt.  Recheck BMET in one week. Would have Hospice call and give [place] an update. It is not clear to me as to what [name] goals of care are.   2. CAD - managed medically.   3. Iron deficiency anemia - followed by Dr. Shirline Frees  4. Multiple medical issues  Patient is agreeable to this plan and will call if any problems develop in the interim.   Rosalio Macadamia,  RN, ANP-C [facility] [address] [place], [place]  [address]

## 2012-11-19 ENCOUNTER — Inpatient Hospital Stay (HOSPITAL_COMMUNITY)
Admission: EM | Admit: 2012-11-19 | Discharge: 2012-11-23 | DRG: 308 | Disposition: A | Discharge status: Hospice | Attending: Internal Medicine | Admitting: Internal Medicine

## 2012-11-19 ENCOUNTER — Emergency Department (HOSPITAL_COMMUNITY)

## 2012-11-19 ENCOUNTER — Encounter (HOSPITAL_COMMUNITY): Payer: Self-pay | Admitting: Emergency Medicine

## 2012-11-19 DIAGNOSIS — Z9981 Dependence on supplemental oxygen: Secondary | ICD-10-CM

## 2012-11-19 DIAGNOSIS — K921 Melena: Secondary | ICD-10-CM

## 2012-11-19 DIAGNOSIS — D509 Iron deficiency anemia, unspecified: Secondary | ICD-10-CM | POA: Diagnosis present

## 2012-11-19 DIAGNOSIS — R296 Repeated falls: Secondary | ICD-10-CM | POA: Diagnosis present

## 2012-11-19 DIAGNOSIS — I441 Atrioventricular block, second degree: Secondary | ICD-10-CM | POA: Diagnosis present

## 2012-11-19 DIAGNOSIS — I2589 Other forms of chronic ischemic heart disease: Secondary | ICD-10-CM | POA: Diagnosis present

## 2012-11-19 DIAGNOSIS — E871 Hypo-osmolality and hyponatremia: Secondary | ICD-10-CM | POA: Diagnosis present

## 2012-11-19 DIAGNOSIS — Z7982 Long term (current) use of aspirin: Secondary | ICD-10-CM

## 2012-11-19 DIAGNOSIS — T4275XA Adverse effect of unspecified antiepileptic and sedative-hypnotic drugs, initial encounter: Secondary | ICD-10-CM | POA: Diagnosis present

## 2012-11-19 DIAGNOSIS — I2510 Atherosclerotic heart disease of native coronary artery without angina pectoris: Secondary | ICD-10-CM | POA: Diagnosis present

## 2012-11-19 DIAGNOSIS — G4733 Obstructive sleep apnea (adult) (pediatric): Secondary | ICD-10-CM | POA: Diagnosis present

## 2012-11-19 DIAGNOSIS — Z79899 Other long term (current) drug therapy: Secondary | ICD-10-CM

## 2012-11-19 DIAGNOSIS — S42302A Unspecified fracture of shaft of humerus, left arm, initial encounter for closed fracture: Secondary | ICD-10-CM

## 2012-11-19 DIAGNOSIS — G9340 Encephalopathy, unspecified: Secondary | ICD-10-CM | POA: Diagnosis present

## 2012-11-19 DIAGNOSIS — IMO0002 Reserved for concepts with insufficient information to code with codable children: Secondary | ICD-10-CM

## 2012-11-19 DIAGNOSIS — J449 Chronic obstructive pulmonary disease, unspecified: Secondary | ICD-10-CM | POA: Diagnosis present

## 2012-11-19 DIAGNOSIS — I5023 Acute on chronic systolic (congestive) heart failure: Secondary | ICD-10-CM | POA: Diagnosis present

## 2012-11-19 DIAGNOSIS — I10 Essential (primary) hypertension: Secondary | ICD-10-CM | POA: Diagnosis present

## 2012-11-19 DIAGNOSIS — F3289 Other specified depressive episodes: Secondary | ICD-10-CM | POA: Diagnosis present

## 2012-11-19 DIAGNOSIS — K219 Gastro-esophageal reflux disease without esophagitis: Secondary | ICD-10-CM | POA: Diagnosis present

## 2012-11-19 DIAGNOSIS — F329 Major depressive disorder, single episode, unspecified: Secondary | ICD-10-CM | POA: Diagnosis present

## 2012-11-19 DIAGNOSIS — K589 Irritable bowel syndrome without diarrhea: Secondary | ICD-10-CM

## 2012-11-19 DIAGNOSIS — Z87891 Personal history of nicotine dependence: Secondary | ICD-10-CM | POA: Diagnosis present

## 2012-11-19 DIAGNOSIS — E785 Hyperlipidemia, unspecified: Secondary | ICD-10-CM | POA: Diagnosis present

## 2012-11-19 DIAGNOSIS — E876 Hypokalemia: Secondary | ICD-10-CM | POA: Diagnosis present

## 2012-11-19 DIAGNOSIS — R55 Syncope and collapse: Secondary | ICD-10-CM

## 2012-11-19 DIAGNOSIS — Z720 Tobacco use: Secondary | ICD-10-CM

## 2012-11-19 DIAGNOSIS — S42209A Unspecified fracture of upper end of unspecified humerus, initial encounter for closed fracture: Secondary | ICD-10-CM | POA: Diagnosis present

## 2012-11-19 DIAGNOSIS — M5412 Radiculopathy, cervical region: Secondary | ICD-10-CM

## 2012-11-19 DIAGNOSIS — I5022 Chronic systolic (congestive) heart failure: Secondary | ICD-10-CM

## 2012-11-19 DIAGNOSIS — I951 Orthostatic hypotension: Secondary | ICD-10-CM | POA: Diagnosis present

## 2012-11-19 DIAGNOSIS — E1129 Type 2 diabetes mellitus with other diabetic kidney complication: Secondary | ICD-10-CM

## 2012-11-19 DIAGNOSIS — I252 Old myocardial infarction: Secondary | ICD-10-CM | POA: Diagnosis present

## 2012-11-19 DIAGNOSIS — F0390 Unspecified dementia without behavioral disturbance: Secondary | ICD-10-CM | POA: Diagnosis present

## 2012-11-19 DIAGNOSIS — I440 Atrioventricular block, first degree: Principal | ICD-10-CM | POA: Diagnosis present

## 2012-11-19 DIAGNOSIS — E119 Type 2 diabetes mellitus without complications: Secondary | ICD-10-CM | POA: Diagnosis present

## 2012-11-19 DIAGNOSIS — M542 Cervicalgia: Secondary | ICD-10-CM

## 2012-11-19 DIAGNOSIS — F411 Generalized anxiety disorder: Secondary | ICD-10-CM | POA: Diagnosis present

## 2012-11-19 DIAGNOSIS — R0902 Hypoxemia: Secondary | ICD-10-CM

## 2012-11-19 DIAGNOSIS — K2970 Gastritis, unspecified, without bleeding: Secondary | ICD-10-CM

## 2012-11-19 DIAGNOSIS — S42352A Displaced comminuted fracture of shaft of humerus, left arm, initial encounter for closed fracture: Secondary | ICD-10-CM

## 2012-11-19 DIAGNOSIS — M7989 Other specified soft tissue disorders: Secondary | ICD-10-CM

## 2012-11-19 DIAGNOSIS — J4489 Other specified chronic obstructive pulmonary disease: Secondary | ICD-10-CM | POA: Diagnosis present

## 2012-11-19 DIAGNOSIS — Z9861 Coronary angioplasty status: Secondary | ICD-10-CM | POA: Diagnosis present

## 2012-11-19 DIAGNOSIS — I509 Heart failure, unspecified: Secondary | ICD-10-CM

## 2012-11-19 DIAGNOSIS — M545 Low back pain: Secondary | ICD-10-CM

## 2012-11-19 HISTORY — DX: Unspecified dementia, unspecified severity, without behavioral disturbance, psychotic disturbance, mood disturbance, and anxiety: F03.90

## 2012-11-19 HISTORY — DX: Gastritis, unspecified, without bleeding: K29.70

## 2012-11-19 HISTORY — DX: Syncope and collapse: R55

## 2012-11-19 HISTORY — DX: Edema, unspecified: R60.9

## 2012-11-19 HISTORY — DX: Abnormal weight loss: R63.4

## 2012-11-19 HISTORY — DX: Chronic systolic (congestive) heart failure: I50.22

## 2012-11-19 LAB — CBC WITH DIFFERENTIAL/PLATELET
Basophils Absolute: 0 10*3/uL (ref 0.0–0.1)
Basophils Absolute: 0 10*3/uL (ref 0.0–0.1)
Basophils Relative: 0.1 % (ref 0.0–3.0)
Basophils Relative: 0 % (ref 0–1)
Eosinophils Absolute: 0.1 10*3/uL (ref 0.0–0.7)
Eosinophils Absolute: 0.1 10*3/uL (ref 0.0–0.7)
Eosinophils Relative: 2.5 % (ref 0.0–5.0)
HCT: 27.5 % — ABNORMAL LOW (ref 36.0–46.0)
HCT: 26.9 % — ABNORMAL LOW (ref 36.0–46.0)
Hemoglobin: 9.3 g/dL — ABNORMAL LOW (ref 12.0–15.0)
Hemoglobin: 9.5 g/dL — ABNORMAL LOW (ref 12.0–15.0)
Lymphocytes Relative: 9.6 % — ABNORMAL LOW (ref 12.0–46.0)
Lymphs Abs: 0.4 10*3/uL — ABNORMAL LOW (ref 0.7–4.0)
Lymphs Abs: 0.6 10*3/uL — ABNORMAL LOW (ref 0.7–4.0)
MCH: 30.6 pg (ref 26.0–34.0)
MCHC: 33.7 g/dL (ref 30.0–36.0)
MCV: 90 fl (ref 78.0–100.0)
MCV: 86.8 fL (ref 78.0–100.0)
Monocytes Absolute: 0.2 10*3/uL (ref 0.1–1.0)
Monocytes Relative: 4.8 % (ref 3.0–12.0)
Monocytes Relative: 5 % (ref 3–12)
Neutro Abs: 3.8 10*3/uL (ref 1.4–7.7)
Neutrophils Relative %: 83 % — ABNORMAL HIGH (ref 43.0–77.0)
Neutrophils Relative %: 82 % — ABNORMAL HIGH (ref 43–77)
Platelets: 308 10*3/uL (ref 150.0–400.0)
RBC: 3.05 Mil/uL — ABNORMAL LOW (ref 3.87–5.11)
RBC: 3.1 MIL/uL — ABNORMAL LOW (ref 3.87–5.11)
RDW: 16.4 % — ABNORMAL HIGH (ref 11.5–14.6)
RDW: 15.8 % — ABNORMAL HIGH (ref 11.5–15.5)
WBC: 4.6 10*3/uL (ref 4.5–10.5)

## 2012-11-19 LAB — COMPREHENSIVE METABOLIC PANEL
AST: 21 U/L (ref 0–37)
Alkaline Phosphatase: 99 U/L (ref 39–117)
BUN: 24 mg/dL — ABNORMAL HIGH (ref 6–23)
GFR calc Af Amer: 53 mL/min — ABNORMAL LOW (ref >90)
Glucose, Bld: 108 mg/dL — ABNORMAL HIGH (ref 70–99)
Potassium: 2.8 mEq/L — ABNORMAL LOW (ref 3.5–5.1)
Total Bilirubin: 0.5 mg/dL (ref 0.3–1.2)
Total Protein: 6.1 g/dL (ref 6.0–8.3)

## 2012-11-19 LAB — GLUCOSE, CAPILLARY: Glucose-Capillary: 146 mg/dL — ABNORMAL HIGH (ref 70–99)

## 2012-11-19 LAB — URINALYSIS, ROUTINE W REFLEX MICROSCOPIC
Ketones, ur: NEGATIVE mg/dL
Leukocytes, UA: NEGATIVE
Nitrite: NEGATIVE
Protein, ur: NEGATIVE mg/dL
Urobilinogen, UA: 1 mg/dL (ref 0.0–1.0)
pH: 7 (ref 5.0–8.0)

## 2012-11-19 LAB — CBC
HCT: 26.4 % — ABNORMAL LOW (ref 36.0–46.0)
MCV: 87.7 fL (ref 78.0–100.0)
Platelets: 295 10*3/uL (ref 150–400)
RDW: 15.5 % (ref 11.5–15.5)
WBC: 6.3 10*3/uL (ref 4.0–10.5)

## 2012-11-19 LAB — BASIC METABOLIC PANEL
BUN: 23 mg/dL (ref 6–23)
BUN: 20 mg/dL (ref 6–23)
CO2: 31 mEq/L (ref 19–32)
CO2: 34 mEq/L — ABNORMAL HIGH (ref 19–32)
Calcium: 8.7 mg/dL (ref 8.4–10.5)
Calcium: 9.1 mg/dL (ref 8.4–10.5)
Chloride: 83 mEq/L — ABNORMAL LOW (ref 96–112)
Chloride: 88 mEq/L — ABNORMAL LOW (ref 96–112)
Creatinine, Ser: 1.2 mg/dL (ref 0.4–1.2)
Creatinine, Ser: 1 mg/dL (ref 0.50–1.10)
GFR calc Af Amer: 66 mL/min — ABNORMAL LOW (ref >90)
GFR calc non Af Amer: 57 mL/min — ABNORMAL LOW (ref >90)
GFR: 55.7 mL/min — ABNORMAL LOW (ref >60.00)
Glucose, Bld: 98 mg/dL (ref 70–99)
Potassium: 3.5 mEq/L (ref 3.5–5.1)
Sodium: 126 mEq/L — ABNORMAL LOW (ref 135–145)
Sodium: 130 mEq/L — ABNORMAL LOW (ref 135–145)

## 2012-11-19 LAB — CREATININE, SERUM
GFR calc Af Amer: 60 mL/min — ABNORMAL LOW (ref >90)
GFR calc non Af Amer: 52 mL/min — ABNORMAL LOW (ref >90)

## 2012-11-19 LAB — BRAIN NATRIURETIC PEPTIDE: Pro B Natriuretic peptide (BNP): 1238 pg/mL — ABNORMAL HIGH (ref 0.0–100.0)

## 2012-11-19 MED ORDER — SODIUM CHLORIDE 0.9 % IJ SOLN: 3.0000 mL | INTRAMUSCULAR | Status: DC | PRN

## 2012-11-19 MED ORDER — TRAMADOL HCL 50 MG PO TABS
50.0000 mg | ORAL_TABLET | Freq: Four times a day (QID) | ORAL | Status: DC | PRN
  Administered 2012-11-19: 50 mg via ORAL
  Administered 2012-11-20 – 2012-11-23 (×6): 100 mg via ORAL
  Filled 2012-11-19 (×7): qty 2

## 2012-11-19 MED ORDER — SODIUM CHLORIDE 0.9 % IV BOLUS (SEPSIS)
1000.0000 mL | Freq: Once | INTRAVENOUS | Status: AC
  Administered 2012-11-19: 1000 mL via INTRAVENOUS

## 2012-11-19 MED ORDER — FENTANYL CITRATE 0.05 MG/ML IJ SOLN
100.0000 ug | Freq: Once | INTRAMUSCULAR | Status: AC
  Administered 2012-11-19: 100 ug via INTRAVENOUS
  Filled 2012-11-19: qty 2

## 2012-11-19 MED ORDER — MORPHINE SULFATE 4 MG/ML IJ SOLN
4.0000 mg | Freq: Once | INTRAMUSCULAR | Status: DC
  Filled 2012-11-19: qty 1

## 2012-11-19 MED ORDER — FUROSEMIDE 80 MG PO TABS
160.0000 mg | ORAL_TABLET | Freq: Two times a day (BID) | ORAL | Status: DC
  Administered 2012-11-20 – 2012-11-23 (×7): 160 mg via ORAL
  Filled 2012-11-19 (×10): qty 2

## 2012-11-19 MED ORDER — FENTANYL CITRATE 0.05 MG/ML IJ SOLN
50.0000 ug | Freq: Once | INTRAMUSCULAR | Status: AC
  Administered 2012-11-19: 50 ug via INTRAVENOUS
  Filled 2012-11-19: qty 2

## 2012-11-19 MED ORDER — ACETAMINOPHEN 500 MG PO TABS
500.0000 mg | ORAL_TABLET | Freq: Four times a day (QID) | ORAL | Status: DC | PRN
Start: 2012-11-19 — End: 2012-11-23

## 2012-11-19 MED ORDER — TIOTROPIUM BROMIDE MONOHYDRATE 18 MCG IN CAPS
18.0000 ug | ORAL_CAPSULE | Freq: Every day | RESPIRATORY_TRACT | Status: DC
  Administered 2012-11-20 – 2012-11-23 (×3): 18 ug via RESPIRATORY_TRACT
  Filled 2012-11-19 (×2): qty 5

## 2012-11-19 MED ORDER — TRAZODONE HCL 150 MG PO TABS
150.0000 mg | ORAL_TABLET | Freq: Every day | ORAL | Status: DC
  Administered 2012-11-19 – 2012-11-22 (×4): 150 mg via ORAL
  Filled 2012-11-19 (×5): qty 1

## 2012-11-19 MED ORDER — ALBUTEROL SULFATE (5 MG/ML) 0.5% IN NEBU: 2.5000 mg | INHALATION_SOLUTION | Freq: Four times a day (QID) | RESPIRATORY_TRACT | Status: DC | PRN

## 2012-11-19 MED ORDER — HEPARIN SODIUM (PORCINE) 5000 UNIT/ML IJ SOLN
5000.0000 [IU] | Freq: Three times a day (TID) | INTRAMUSCULAR | Status: DC
  Administered 2012-11-19 – 2012-11-23 (×11): 5000 [IU] via SUBCUTANEOUS
  Filled 2012-11-19 (×14): qty 1

## 2012-11-19 MED ORDER — SODIUM CHLORIDE 0.9 % IJ SOLN
3.0000 mL | Freq: Two times a day (BID) | INTRAMUSCULAR | Status: DC
  Administered 2012-11-19 – 2012-11-23 (×6): 3 mL via INTRAVENOUS

## 2012-11-19 MED ORDER — PANTOPRAZOLE SODIUM 40 MG PO TBEC
40.0000 mg | DELAYED_RELEASE_TABLET | Freq: Every day | ORAL | Status: DC
  Administered 2012-11-19 – 2012-11-23 (×5): 40 mg via ORAL
  Filled 2012-11-19 (×4): qty 1

## 2012-11-19 MED ORDER — DICLOFENAC SODIUM 1 % TD GEL
2.0000 g | Freq: Four times a day (QID) | TRANSDERMAL | Status: DC
  Administered 2012-11-20 – 2012-11-22 (×11): 2 g via TOPICAL
  Filled 2012-11-19 (×3): qty 100

## 2012-11-19 MED ORDER — GABAPENTIN 300 MG PO CAPS
300.0000 mg | ORAL_CAPSULE | Freq: Two times a day (BID) | ORAL | Status: DC
  Administered 2012-11-19 – 2012-11-23 (×9): 300 mg via ORAL
  Filled 2012-11-19 (×10): qty 1

## 2012-11-19 MED ORDER — ATORVASTATIN CALCIUM 40 MG PO TABS
40.0000 mg | ORAL_TABLET | Freq: Every day | ORAL | Status: DC
  Administered 2012-11-19 – 2012-11-22 (×4): 40 mg via ORAL
  Filled 2012-11-19 (×5): qty 1

## 2012-11-19 MED ORDER — SODIUM CHLORIDE 0.9 % IV SOLN: 250.0000 mL | INTRAVENOUS | Status: DC | PRN

## 2012-11-19 MED ORDER — ISOSORBIDE MONONITRATE ER 60 MG PO TB24
60.0000 mg | ORAL_TABLET | Freq: Every day | ORAL | Status: DC
  Administered 2012-11-19 – 2012-11-22 (×4): 60 mg via ORAL
  Filled 2012-11-19 (×5): qty 1

## 2012-11-19 MED ORDER — ONDANSETRON HCL 4 MG/2ML IJ SOLN: 4.0000 mg | Freq: Four times a day (QID) | INTRAMUSCULAR | Status: DC | PRN

## 2012-11-19 MED ORDER — POTASSIUM CHLORIDE 10 MEQ/100ML IV SOLN
10.0000 meq | Freq: Once | INTRAVENOUS | Status: AC
  Administered 2012-11-19: 10 meq via INTRAVENOUS
  Filled 2012-11-19: qty 100

## 2012-11-19 MED ORDER — POTASSIUM CHLORIDE 10 MEQ/100ML IV SOLN
10.0000 meq | INTRAVENOUS | Status: AC
  Administered 2012-11-19: 10 meq via INTRAVENOUS
  Filled 2012-11-19: qty 100

## 2012-11-19 MED ORDER — ONDANSETRON HCL 4 MG PO TABS: 4.0000 mg | ORAL_TABLET | Freq: Four times a day (QID) | ORAL | Status: DC | PRN

## 2012-11-19 MED ORDER — POLYETHYLENE GLYCOL 3350 17 G PO PACK
17.0000 g | PACK | Freq: Every day | ORAL | Status: DC | PRN
  Filled 2012-11-19: qty 1

## 2012-11-19 MED ORDER — POTASSIUM CHLORIDE CRYS ER 20 MEQ PO TBCR
40.0000 meq | EXTENDED_RELEASE_TABLET | Freq: Once | ORAL | Status: AC
  Administered 2012-11-19: 40 meq via ORAL
  Filled 2012-11-19: qty 2

## 2012-11-19 MED ORDER — NITROGLYCERIN 0.4 MG SL SUBL: 0.4000 mg | SUBLINGUAL_TABLET | SUBLINGUAL | Status: DC | PRN

## 2012-11-19 MED ORDER — FERRAPLUS 90 90-1 MG PO TABS: 1.0000 | ORAL_TABLET | Freq: Every day | ORAL | Status: DC

## 2012-11-19 MED ORDER — FUROSEMIDE 80 MG PO TABS: 160.0000 mg | ORAL_TABLET | Freq: Two times a day (BID) | ORAL | Status: DC

## 2012-11-19 MED ORDER — FE FUMARATE-B12-VIT C-FA-IFC PO CAPS
1.0000 | ORAL_CAPSULE | Freq: Every day | ORAL | Status: DC
  Administered 2012-11-20 – 2012-11-23 (×4): 1 via ORAL
  Filled 2012-11-19 (×5): qty 1

## 2012-11-19 MED ORDER — ASPIRIN 81 MG PO TABS: 81.0000 mg | ORAL_TABLET | Freq: Every day | ORAL | Status: DC

## 2012-11-19 MED ORDER — ASPIRIN EC 81 MG PO TBEC
81.0000 mg | DELAYED_RELEASE_TABLET | Freq: Every day | ORAL | Status: DC
  Administered 2012-11-19 – 2012-11-23 (×5): 81 mg via ORAL
  Filled 2012-11-19 (×5): qty 1

## 2012-11-19 MED ORDER — SODIUM CHLORIDE 0.9 % IJ SOLN
3.0000 mL | Freq: Two times a day (BID) | INTRAMUSCULAR | Status: DC
  Administered 2012-11-19 – 2012-11-23 (×7): 3 mL via INTRAVENOUS

## 2012-11-19 NOTE — Progress Notes (Signed)
Room [facility] 2W 26 - Tenae Shimer - [facility] RN Visit-R.Iara Monds RN  Related admission to [facility] diagnosis of CAD.  Pt is FULL code.    Pt alert & oriented, lying in bed, with complaints of pain in L/upper arm @ 8/10.    No family present.  Patient's home medication list is on shadow chart.   Pt admitted following ED visit due to fall in the home.  Pt states she was getting up in bathroom and became dizzy and fell, sustaining injury to L/upper arm.  Fracture surgical neck L/humerus.  Discussed with staff RN who will assess for pain meds and states they are awaiting Ortho consult as to surgical interventions.   Please call [facility] @ [phone]- ask for RN Liaison or after hours,ask for on-call RN with any hospice needs.   Thank you.  Joneen Boers, RN  [facility]  Hospice Liaison  [phone])

## 2012-11-19 NOTE — ED Provider Notes (Signed)
CSN: [id number]     Arrival date & time 11/19/12  4401 History   First MD Initiated Contact with Patient 11/19/12 0830     Chief Complaint  Patient presents with  . Loss of Consciousness  . Fall   (Consider location/radiation/quality/duration/timing/severity/associated sxs/prior Treatment) HPI Comments: Patient is a 67 year old female past medical history significant for DM, HTN, CAD, CHF, GERD, COPD on 2 L of oxygen at home, asthma, hyponatremia, anxiety, depression presented to the emergency department after a syncopal episode this morning. Patient states she was going to the restroom, but denies straining for a BM, when she finished and stood up she syncopized. Patient states when she woke up she had severe posterior headache and sharp left shoulder pain with radiation down left arm. She denies having any chest pain, new or worsening shortness of breath from baseline, abdominal pain, headache, dizziness, lightheadedness prior to syncopal episode. She states on top of her headache and shoulder pain she also feels lightheaded. Patient continues to deny that she has any chest pain, shortness of breath, abdominal pain, nausea or vomiting. Patient does take an aspirin daily. She lives at home with her husband.  Patient is a 67 y.o. female presenting with syncope. The history is provided by the patient and the EMS personnel.  Loss of Consciousness Associated symptoms: headaches   Associated symptoms: no chest pain, no fever, no nausea, no shortness of breath and no vomiting     Past Medical History  Diagnosis Date  . DM   . DYSLIPIDEMIA   . OBESITY NOS   . DEPRESSION, RECURRENT, IN PARTIAL REMISSION   . HYPERTENSION   . 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 CONGESTIVE HEART FAILURE     EF 40-45% echo 05/2011  . GERD   . DIVERTICULOSIS, COLON, WITH  HEMORRHAGE   . BACK PAIN, LUMBAR, WITH RADICULOPATHY   . OBSTRUCTIVE SLEEP APNEA   . Asthma   . Anemia, iron deficiency   . Hyponatremia 12/2011  . Tobacco abuse   . Moderate COPD (chronic obstructive pulmonary disease) 11/07/2005    Spirometry 03/09/11>>FEV1 2.02 (88%), FEV1% 76    . Anxiety   . Arthritis   . Blood transfusion without reported diagnosis 1970's  . Cataract   . Myocardial infarction 2001   Past Surgical History  Procedure Laterality Date  . Uvuloplasty    . Total abdominal hysterectomy w/ bilateral salpingoophorectomy    . Abdominal hysterectomy    . Cholecystectomy  2004  . Coronary angioplasty with stent placement  2002    last 04-29-12  . Esophagogastroduodenoscopy (egd) with propofol N/A 06/06/2012    Procedure: ESOPHAGOGASTRODUODENOSCOPY (EGD) WITH PROPOFOL;  Surgeon: Rachael Fee, MD;  Location: [facility];  Service: Endoscopy;  Laterality: N/A;   Family History  Problem Relation Age of Onset  . Stroke Mother   . Breast cancer Sister   . Emphysema Sister   . Cancer Sister     breast  . Alcohol abuse Brother   . Colon cancer      uncles x 2  . Prostate cancer Brother    History  Substance Use Topics  . Smoking status: Former Smoker -- 0.25 packs/day for 20 years    Types: Cigarettes    Quit date: 07/17/2012  . Smokeless tobacco: Never Used     Comment:  Still smokes a little.  . Alcohol Use: No     Comment: Patient drinks cafinated daily.   OB History   Grav Para Term Preterm Abortions TAB SAB Ect Mult Living                 Review of Systems  Constitutional: Negative for fever.  Respiratory: Negative for shortness of breath.   Cardiovascular: Positive for syncope. Negative for chest pain.  Gastrointestinal: Negative for nausea, vomiting, abdominal pain, diarrhea, constipation, blood in stool and anal bleeding.  Musculoskeletal: Positive for arthralgias, joint swelling and myalgias.  Skin: Negative.   Neurological: Positive for syncope,  light-headedness and headaches.  All other systems reviewed and are negative.    Allergies  Amlodipine; Ramipril; Adhesive; Morphine and related; Penicillins; and Codeine  Home Medications   Current Outpatient Rx  Name  Route  Sig  Dispense  Refill  . acetaminophen (TYLENOL) 500 MG tablet   Oral   Take 500 mg by mouth every 6 (six) hours as needed for pain.         [name] albuterol (PROVENTIL) (2.5 MG/3ML) 0.083% nebulizer solution   Nebulization   Take 2.5 mg by nebulization every 6 (six) hours as needed for wheezing.         [name] aspirin 81 MG tablet   Oral   Take 81 mg by mouth daily.           [name] atorvastatin (LIPITOR) 40 MG tablet   Oral   Take 40 mg by mouth every morning.         . diclofenac sodium (VOLTAREN) 1 % GEL   Topical   Apply 2 g topically 4 (four) times daily.         . feeding supplement (GLUCERNA SHAKE) LIQD   Oral   Take 237 mLs by mouth as needed.          . furosemide (LASIX) 80 MG tablet   Oral   Take 160 mg by mouth 2 (two) times daily.         [name] gabapentin (NEURONTIN) 300 MG capsule   Oral   Take 1 capsule (300 mg total) by mouth 2 (two) times daily.   60 capsule   5   . hydrALAZINE (APRESOLINE) 50 MG tablet   Oral   Take 50 mg by mouth every 8 (eight) hours.         . hydrOXYzine (ATARAX/VISTARIL) 10 MG tablet   Oral   Take 10 mg by mouth 2 (two) times daily.         . Iron-Folic Acid-B12-C-Docusate (FERRAPLUS 90) 90-1 MG TABS   Oral   Take 1 tablet by mouth daily.   90 tablet   3   . isosorbide mononitrate (IMDUR) 60 MG 24 hr tablet   Oral   Take 60 mg by mouth daily at 12 noon.         [employer] (SIMPLE DIAGNOSTICS LANCING DEV) MISC               . losartan (COZAAR) 50 MG tablet   Oral   Take 1 tablet (50 mg total) by mouth daily.   90 tablet   3   . metolazone (ZAROXOLYN) 2.5 MG tablet   Oral   Take 1 tablet (2.5 mg total) by mouth 3 (three) times a week.   12 tablet   6   . nitroGLYCERIN  (NITROSTAT) 0.4 MG SL tablet   Sublingual   Place  1 tablet (0.4 mg total) under the tongue every 5 (five) minutes x 3 doses as needed for chest pain.   25 tablet   3   . omeprazole (PRILOSEC) 40 MG capsule   Oral   Take 40 mg by mouth daily.         . polyethylene glycol (MIRALAX / GLYCOLAX) packet   Oral   Take 17 g by mouth daily as needed (for constipation).         . potassium chloride (K-DUR,KLOR-CON) 10 MEQ tablet   Oral   Take 10-20 mEq by mouth See admin instructions. Take 3 tablets twice daily on days that Metolazone is taken. Take 2 tablets twice daily on other days         . sitaGLIPtan-metformin (JANUMET) 50-500 MG per tablet   Oral   Take 1 tablet by mouth 2 (two) times daily with a meal.         . spironolactone (ALDACTONE) 25 MG tablet   Oral   Take 1 tablet (25 mg total) by mouth daily.   30 tablet   6   . tiotropium (SPIRIVA) 18 MCG inhalation capsule   Inhalation   Place 18 mcg into inhaler and inhale daily.         . traMADol (ULTRAM) 50 MG tablet   Oral   Take 50-100 mg by mouth every 6 (six) hours as needed for moderate pain.         . traZODone (DESYREL) 100 MG tablet   Oral   Take 150 mg by mouth at bedtime.         [name] venlafaxine XR (EFFEXOR-XR) 150 MG 24 hr capsule   Oral   Take 150 mg by mouth every morning.          BP 164/77  Pulse 82  Temp(Src) 98.7 F (37.1 C) (Oral)  Resp 16  SpO2 100% Physical Exam  Constitutional: She is oriented to person, place, and time. She appears well-developed and well-nourished. No distress.  HENT:  Head: Normocephalic and atraumatic.  Right Ear: External ear normal.  Left Ear: External ear normal.  Nose: Nose normal.  Mouth/Throat: Uvula is midline. Mucous membranes are dry.  Mucous membranes are dry and cracked w/ crusting.   Eyes: Conjunctivae and EOM are normal. Pupils are equal, round, and reactive to light.  Neck: Normal range of motion. Neck supple.  Cardiovascular: Normal  rate, regular rhythm, normal heart sounds and intact distal pulses.   Pulmonary/Chest: Effort normal and breath sounds normal. No respiratory distress. She has no wheezes. She has no rales. She exhibits no tenderness.  Abdominal: Soft. Bowel sounds are normal. She exhibits no distension. There is no tenderness. There is no rebound and no guarding.  Musculoskeletal:       Right shoulder: Normal.       Left shoulder: She exhibits decreased range of motion, tenderness, bony tenderness, swelling, pain and decreased strength. She exhibits normal pulse.       Right elbow: Normal.      Left elbow: She exhibits decreased range of motion. She exhibits no swelling, no effusion, no deformity and no laceration. No tenderness found. No radial head, no medial epicondyle, no lateral epicondyle and no olecranon process tenderness noted.       Right wrist: Normal.       Left wrist: She exhibits decreased range of motion. She exhibits no tenderness, no bony tenderness, no swelling, no effusion, no crepitus, no deformity and no laceration.  Cervical back: Normal.       Thoracic back: Normal.       Lumbar back: She exhibits tenderness. She exhibits normal range of motion, no bony tenderness, no swelling, no edema, no deformity, no laceration, no spasm and normal pulse.       Back:       Left upper arm: She exhibits tenderness. She exhibits no swelling, no edema, no deformity and no laceration.       Right forearm: Normal.       Left forearm: Normal.       Right hand: Normal.       Left hand: Normal.  Lymphadenopathy:    She has no cervical adenopathy.  Neurological: She is alert and oriented to person, place, and time. No cranial nerve deficit. GCS eye subscore is 4. GCS verbal subscore is 5. GCS motor subscore is 6.  Unable to perform pronator drift or finger-nose-finger, d/t left shoulder injury. Bilateral heel-knee-shin intact.   Skin: Skin is warm and dry. She is not diaphoretic.  Psychiatric: She has  a normal mood and affect.    ED Course  Procedures (including critical care time) Medications  potassium chloride 10 mEq in 100 mL IVPB (not administered)  fentaNYL (SUBLIMAZE) injection 50 mcg (50 mcg Intravenous Given 11/19/12 0849)  sodium chloride 0.9 % bolus 1,000 mL (0 mLs Intravenous Stopped 11/19/12 1059)  potassium chloride 10 mEq in 100 mL IVPB (10 mEq Intravenous Rate/Dose Change 11/19/12 1150)  potassium chloride SA (K-DUR,KLOR-CON) CR tablet 40 mEq (40 mEq Oral Given 11/19/12 1138)  fentaNYL (SUBLIMAZE) injection 100 mcg (100 mcg Intravenous Given 11/19/12 1138)    Labs Review Labs Reviewed  CBC WITH DIFFERENTIAL - Abnormal; Notable for the following:    RBC 3.10 (*)    Hemoglobin 9.5 (*)    HCT 26.9 (*)    RDW 15.8 (*)    Neutrophils Relative % 82 (*)    Lymphs Abs 0.6 (*)    All other components within normal limits  COMPREHENSIVE METABOLIC PANEL - Abnormal; Notable for the following:    Sodium 128 (*)    Potassium 2.8 (*)    Chloride 86 (*)    Glucose, Bld 108 (*)    BUN 24 (*)    Creatinine, Ser 1.19 (*)    Albumin 2.8 (*)    GFR calc non Af Amer 46 (*)    GFR calc Af Amer 53 (*)    All other components within normal limits  URINALYSIS, ROUTINE W REFLEX MICROSCOPIC  GLUCOSE, CAPILLARY  TROPONIN I  TROPONIN I  TROPONIN I  BASIC METABOLIC PANEL  POCT I-STAT TROPONIN I  POCT CBG (FASTING - GLUCOSE)-MANUAL ENTRY   Imaging Review Dg Chest 2 View  11/19/2012   CLINICAL DATA:  Lost consciousness, left shoulder pain.  EXAM: CHEST  2 VIEW  COMPARISON:  08/01/2012  FINDINGS: The heart size is enlarged. There is bilateral interstitial thickening likely chronic. There is no focal consolidation or pneumothorax. There is a trace right pleural effusion versus scarring.  There is a comminuted fracture of the left surgical neck of the proximal humerus.  IMPRESSION: Comminuted fracture of the left surgical neck of the proximal humerus.  Cardiomegaly.   Electronically  Signed   By: Elige Ko   On: 11/19/2012 10:21   Ct Head Wo Contrast  11/19/2012   CLINICAL DATA:  Syncopal episode.  Fall.  EXAM: CT HEAD WITHOUT CONTRAST  CT CERVICAL SPINE WITHOUT CONTRAST  TECHNIQUE: Multidetector  CT imaging of the head and cervical spine was performed following the standard protocol without intravenous contrast. Multiplanar CT image reconstructions of the cervical spine were also generated.  COMPARISON:  05/20/2012 MR. 05/02/2011 CT head. 08/04/2005 CT head and cervical spine CT.  FINDINGS: CT HEAD FINDINGS  No skull fracture or intracranial hemorrhage.  Prominent small vessel disease type changes without CT evidence of large acute infarct.  No intracranial mass lesion noted on this unenhanced exam.  Mild atrophy without hydrocephalus.  Vascular calcifications  CT CERVICAL SPINE FINDINGS  Head tilt to the left with curvature of the cervical spine. Rotation of C1 upon C2. If there is a high clinical suspicion of ligamentous injury, flexion and extension views or MR can be performed for further delineation.  No cervical spine fracture noted.  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  Well corticated left C3-4 foraminal bony structures seen on prior exam and felt unlikely to be related to acute injury.  Lung apices are clear.  Prominent carotid bifurcation calcifications.  IMPRESSION: No skull fracture or intracranial hemorrhage.  No cervical spine fracture.  Rotation of C1 upon C2 with head tilt to left as noted above.   Electronically Signed   By: Bridgett Larsson M.D.   On: 11/19/2012 10:43   Ct Cervical Spine Wo Contrast  11/19/2012   CLINICAL DATA:  Syncopal episode.  Fall.  EXAM: CT HEAD WITHOUT CONTRAST  CT CERVICAL SPINE WITHOUT CONTRAST  TECHNIQUE: Multidetector CT imaging of the head and cervical spine was performed following the standard protocol without intravenous contrast. Multiplanar CT image reconstructions of the cervical  spine were also generated.  COMPARISON:  05/20/2012 MR. 05/02/2011 CT head. 08/04/2005 CT head and cervical spine CT.  FINDINGS: CT HEAD FINDINGS  No skull fracture or intracranial hemorrhage.  Prominent small vessel disease type changes without CT evidence of large acute infarct.  No intracranial mass lesion noted on this unenhanced exam.  Mild atrophy without hydrocephalus.  Vascular calcifications  CT CERVICAL SPINE FINDINGS  Head tilt to the left with curvature of the cervical spine. Rotation of C1 upon C2. If there is a high clinical suspicion of ligamentous injury, flexion and extension views or MR can be performed for further delineation.  No cervical spine fracture noted.  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  Well corticated left C3-4 foraminal bony structures seen on prior exam and felt unlikely to be related to acute injury.  Lung apices are clear.  Prominent carotid bifurcation calcifications.  IMPRESSION: No skull fracture or intracranial hemorrhage.  No cervical spine fracture.  Rotation of C1 upon C2 with head tilt to left as noted above.   Electronically Signed   By: Bridgett Larsson M.D.   On: 11/19/2012 10:43   Dg Shoulder Left  11/19/2012   CLINICAL DATA:  Left shoulder pain, loss of consciousness.  EXAM: LEFT SHOULDER - 2+ VIEW  COMPARISON:  None.  FINDINGS: There is a comminuted fracture of the surgical neck of the left proximal humerus which also involves the greater tuberosity. There is apex anterior angulation. There is no glenohumeral dislocation. There are mild degenerative changes of the acromioclavicular joint.  IMPRESSION: Comminuted fracture of the surgical neck of the left proximal humerus.   Electronically Signed   By: Elige Ko   On: 11/19/2012 10:23    EKG Interpretation    Date/Time:  Tuesday November 19 2012 08:39:06 EST Ventricular Rate:  77  PR Interval:  257 QRS Duration: 108 QT Interval:  513 QTC  Calculation: 581 R Axis:   8 Text Interpretation:  Sinus rhythm Prolonged PR interval LVH with secondary repolarization abnormality Inferior infarct, old Prolonged QT interval Artifact            MDM   1. Syncope   2. Hypokalemia   3. Comminuted left humeral fracture, closed, initial encounter     Afebrile, NAD, non-toxic appearing, AAOx4.I have reviewed nursing notes, vital signs, and all appropriate lab and imaging results for this patient.   1) Syncope: No neurofocal deficits. CT scan w/o acute abnormality. Labs reviewed.   2) Hypokalemia: No EKG changes. IV and PO K+ given to patient.   3) Comminuted left humeral fracture: Pt w/ bony and muscular tenderness to left shoulder. Decreased ROM. Neurovascularly intact. Normal sensation. Consulted Dr. Shelle Iron, oncall orthopedist to discuss patient, and he states that he will have Dr. Ranell Patrick stop by and evaluate the patient while in hospital. Recommends slinging shoulder (already done) and icing along with pain management.   Patient will be admitted to [facility] telemetry for further evaluation and management. The patient appears reasonably stabilized for admission considering the current resources, flow, and capabilities available in the ED at this time, and I doubt any other [facility] requiring further screening and/or treatment in the ED prior to admission.Patient d/w with Dr. Deretha Emory, agrees with plan.      Jeannetta Ellis, PA-C 11/19/12 1415

## 2012-11-19 NOTE — ED Notes (Signed)
Pt sitting on toilet, syncopal episode, fell, hit left arm/shoulder, pain to area, 8/10, no deformity noted. Pt still feels dizzy.

## 2012-11-19 NOTE — ED Notes (Signed)
UA being collected, phlebotomy outside waiting. Pt alert.

## 2012-11-19 NOTE — Progress Notes (Signed)
Orthopedic Tech Progress Note Patient Details:  [name] [birth year] [id number] Spoke with nurse, patient has humeral fx and is to be consulted by Ortho doc. No splint application order at this time. Nurse to apply sling. Ortho Tech on standby for further instructions.  Patient ID: [name], female   DOB: [birth year], 67 y.o.   MRN: [id number]   [name] 11/19/2012, 11:44 AM

## 2012-11-19 NOTE — Progress Notes (Signed)
Received report from Thayer Ohm, [place] in ED. Awaiting pt arrival.

## 2012-11-19 NOTE — Progress Notes (Signed)
Pt arrived on floor. Vital signs stable. Pt resting in bed with call bell within reach. MD made aware of pt's arrival. Will continue to monitor.

## 2012-11-19 NOTE — ED Notes (Signed)
Per pt, was sitting on the toilet, "next thing i know, i'm in the shower". Pt fell into shower, currently still feels dizzy. C/o pain in left shoulder, shoulder is swollen. Denies SOB, CP.

## 2012-11-19 NOTE — ED Notes (Signed)
Attempted Report 

## 2012-11-19 NOTE — H&P (Signed)
[facility] History and Physical  [name] YNW:[id number] DOB: 23-Feb-1945 DOA: 11/19/2012  Referring physician: Silvestre Moment, PA.  PCP: [name], MD  Specialists: none  Chief Complaint: Fall, pass out.   HPI: [name] is a 67 y.o. female with PMH CAD, Systolic heart failure EF 40 % by echo 2013, on hospice for heart failure, Obstructive sleep apnea, hyponatremia, COPD on home oxygen 2 L,  who presents to ED after a syncope episode. Patient during my interview  was  somnolent. She wake up to answer questions. She fall sleep in between conversation. Patient just received fentanyl. Patient was able to tell me that she fell today. "She fell sleep on the toilet". Per ED physician assitance report:  Patient went to the bathroom, she stood up, felt lightheaded then  Patient pass out.  No history of chest pain or dyspnea. Patient was seen at her cardiology office 11-17. She was started on aldactone. Patient has not been taking aldactone.    Patient was complaining of headache and left shoulder pain on arrival to ED. She was found to have Comminuted fracture of the surgical neck of the left proximal humerus.    Review of Systems: unable to obtain due to patient status, sedation.   Past Medical History  Diagnosis Date  . DM   . DYSLIPIDEMIA   . OBESITY NOS   . DEPRESSION, RECURRENT, IN PARTIAL REMISSION   . HYPERTENSION   . 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 CONGESTIVE HEART FAILURE     EF 40-45% echo 05/2011  . GERD   . DIVERTICULOSIS, COLON, WITH HEMORRHAGE   . BACK PAIN, LUMBAR, WITH RADICULOPATHY   . OBSTRUCTIVE SLEEP APNEA   . Asthma   . Anemia, iron deficiency   . Hyponatremia 12/2011  . Tobacco abuse   . Moderate COPD (chronic obstructive pulmonary disease) 11/07/2005    Spirometry 03/09/11>>FEV1 2.02 (88%), FEV1% 76     . Anxiety   . Arthritis   . Blood transfusion without reported diagnosis 1970's  . Cataract   . Myocardial infarction 2001   Past Surgical History  Procedure Laterality Date  . Uvuloplasty    . Total abdominal hysterectomy w/ bilateral salpingoophorectomy    . Abdominal hysterectomy    . Cholecystectomy  2004  . Coronary angioplasty with stent placement  2002    last 04-29-12  . Esophagogastroduodenoscopy (egd) with propofol N/A 06/06/2012    Procedure: ESOPHAGOGASTRODUODENOSCOPY (EGD) WITH PROPOFOL;  Surgeon: Rachael Fee, MD;  Location: [facility];  Service: Endoscopy;  Laterality: N/A;   Social History:  reports that she quit smoking about 4 months ago. Her smoking use included Cigarettes. She has a 5 pack-year smoking history. She has never used smokeless tobacco. She reports that she does not drink alcohol or use illicit drugs.    Allergies  Allergen Reactions  . Amlodipine Other (See Comments)    constipation  . Ramipril Cough  . Adhesive [Tape] Hives    "peels skin off"  . Morphine And Related Other (See Comments)    Per Husband, Pt has hallucinations  . Penicillins Itching  . Codeine Rash    Family History  Problem Relation Age of Onset  . Stroke Mother   . Breast cancer Sister   . Emphysema Sister   . Cancer Sister  breast  . Alcohol abuse Brother   . Colon cancer      uncles x 2  . Prostate cancer Brother      Prior to Admission medications   Medication Sig Start Date End Date Taking? Authorizing Provider  acetaminophen (TYLENOL) 500 MG tablet Take 500 mg by mouth every 6 (six) hours as needed for pain.   Yes Historical Provider, MD  albuterol (PROVENTIL) (2.5 MG/3ML) 0.083% nebulizer solution Take 2.5 mg by nebulization every 6 (six) hours as needed for wheezing.   Yes Historical Provider, MD  aspirin 81 MG tablet Take 81 mg by mouth daily.     Yes Historical Provider, MD  atorvastatin (LIPITOR) 40 MG tablet Take 40 mg by mouth every morning.    Yes Historical Provider, MD  diclofenac sodium (VOLTAREN) 1 % GEL Apply 2 g topically 4 (four) times daily. 06/12/12  Yes Karen Prueter, PA-C  feeding supplement (GLUCERNA SHAKE) LIQD Take 237 mLs by mouth as needed.    Yes Historical Provider, MD  furosemide (LASIX) 80 MG tablet Take 160 mg by mouth 2 (two) times daily.   Yes Historical Provider, MD  gabapentin (NEURONTIN) 300 MG capsule Take 1 capsule (300 mg total) by mouth 2 (two) times daily. 06/12/12  Yes Clydie Braun Prueter, PA-C  hydrALAZINE (APRESOLINE) 50 MG tablet Take 50 mg by mouth every 8 (eight) hours.   Yes Historical Provider, MD  hydrOXYzine (ATARAX/VISTARIL) 10 MG tablet Take 10 mg by mouth 2 (two) times daily.   Yes Historical Provider, MD  Iron-Folic Acid-B12-C-Docusate (FERRAPLUS 90) 90-1 MG TABS Take 1 tablet by mouth daily. 10/30/12  Yes [name], MD  isosorbide mononitrate (IMDUR) 60 MG 24 hr tablet Take 60 mg by mouth daily at 12 noon.   Yes Historical Provider, MD  Lancet Devices (SIMPLE DIAGNOSTICS LANCING DEV) MISC  10/01/12  Yes Historical Provider, MD  losartan (COZAAR) 50 MG tablet Take 1 tablet (50 mg total) by mouth daily. 10/30/12  Yes [name], MD  metolazone (ZAROXOLYN) 2.5 MG tablet Take 1 tablet (2.5 mg total) by mouth 3 (three) times a week. 09/04/12  Yes Dyann Kief, PA-C  nitroGLYCERIN (NITROSTAT) 0.4 MG SL tablet Place 1 tablet (0.4 mg total) under the tongue every 5 (five) minutes x 3 doses as needed for chest pain. 04/30/12  Yes Rhonda G Barrett, PA-C  omeprazole (PRILOSEC) 40 MG capsule Take 40 mg by mouth daily.   Yes Historical Provider, MD  polyethylene glycol (MIRALAX / GLYCOLAX) packet Take 17 g by mouth daily as needed (for constipation).   Yes Historical Provider, MD  potassium chloride (K-DUR,KLOR-CON) 10 MEQ tablet Take 10-20 mEq by mouth See admin instructions. Take 3 tablets twice daily on days that Metolazone is taken. Take 2 tablets twice daily on other days   Yes Historical Provider, MD   sitaGLIPtan-metformin (JANUMET) 50-500 MG per tablet Take 1 tablet by mouth 2 (two) times daily with a meal.   Yes Historical Provider, MD  spironolactone (ALDACTONE) 25 MG tablet Take 1 tablet (25 mg total) by mouth daily. 11/18/12  Yes Rosalio Macadamia, NP  tiotropium (SPIRIVA) 18 MCG inhalation capsule Place 18 mcg into inhaler and inhale daily.   Yes Historical Provider, MD  traMADol (ULTRAM) 50 MG tablet Take 50-100 mg by mouth every 6 (six) hours as needed for moderate pain.   Yes Historical Provider, MD  traZODone (DESYREL) 100 MG tablet Take 150 mg by mouth at bedtime.   Yes Historical Provider,  MD  venlafaxine XR (EFFEXOR-XR) 150 MG 24 hr capsule Take 150 mg by mouth every morning.   Yes Historical Provider, MD   Physical Exam: Filed Vitals:   11/19/12 1100  BP: 164/77  Pulse: 82  Temp:   Resp: 16   General Appearance:    Alert, cooperative, no distress, appears stated age  Head:    Normocephalic, without obvious abnormality, atraumatic  Eyes:    PERRL, conjunctiva/corneas clear, EOM's intact    Ears:    Normal TM's and external ear canals, both ears  Nose:   Nares normal, septum midline, mucosa normal, no drainage    or sinus tenderness  Throat:   Lips, mucosa, and tongue normal; teeth and gums normal  Neck:   Supple, symmetrical, trachea midline, no adenopathy;    thyroid:  no enlargement/tenderness/nodules; no carotid   Bruit, positive JVD     Lungs:     No wheezes, few crackles,  respirations unlabored  Chest Wall:    No tenderness or deformity   Heart:    Regular rate and rhythm, S1 and S2 normal, no murmur, rub   or gallop     Abdomen:     Soft, non-tender, bowel sounds active all four quadrants,    no masses, no organomegaly  Genitalia:    Deferred  Rectal:    Deferred.   Extremities:   Extremities normal, atraumatic, no cyanosis, plus 2 edema.   Pulses:   2+ and symmetric all extremities  Skin:   Skin color, texture, turgor normal, no rashes or lesions  Lymph  nodes:   Cervical, supraclavicular, and axillary nodes normal  Neurologic:   Patient sedated, open eyes  to answer some question. She just received fentanyl. Limited evaluation.     Labs on Admission:  Basic Metabolic Panel:  Recent Labs Lab 11/18/12 1608 11/19/12 0934  NA 126* 128*  K 3.5 2.8*  CL 83* 86*  CO2 31 29  GLUCOSE 98 108*  BUN 23 24*  CREATININE 1.2 1.19*  CALCIUM 8.7 8.8   Liver Function Tests:  Recent Labs Lab 11/19/12 0934  AST 21  ALT 14  ALKPHOS 99  BILITOT 0.5  PROT 6.1  ALBUMIN 2.8*   No results found for this basename: LIPASE, AMYLASE,  in the last 168 hours No results found for this basename: AMMONIA,  in the last 168 hours CBC:  Recent Labs Lab 11/18/12 1608 11/19/12 0934  WBC 4.6 5.0  NEUTROABS 3.8 4.1  HGB 9.3* 9.5*  HCT 27.5* 26.9*  MCV 90.0 86.8  PLT 308.0 260   Cardiac Enzymes: No results found for this basename: CKTOTAL, CKMB, CKMBINDEX, TROPONINI,  in the last 168 hours  BNP (last 3 results)  Recent Labs  08/01/12 0902 08/14/12 0946 11/18/12 1608  PROBNP 1286.0* 1536.0* 1238.0*   CBG:  Recent Labs Lab 11/19/12 0850  GLUCAP 97    Radiological Exams on Admission: Dg Chest 2 View  11/19/2012   CLINICAL DATA:  Lost consciousness, left shoulder pain.  EXAM: CHEST  2 VIEW  COMPARISON:  08/01/2012  FINDINGS: The heart size is enlarged. There is bilateral interstitial thickening likely chronic. There is no focal consolidation or pneumothorax. There is a trace right pleural effusion versus scarring.  There is a comminuted fracture of the left surgical neck of the proximal humerus.  IMPRESSION: Comminuted fracture of the left surgical neck of the proximal humerus.  Cardiomegaly.   Electronically Signed   By: Elige Ko   On: 11/19/2012  10:21   Ct Head Wo Contrast  11/19/2012   CLINICAL DATA:  Syncopal episode.  Fall.  EXAM: CT HEAD WITHOUT CONTRAST  CT CERVICAL SPINE WITHOUT CONTRAST  TECHNIQUE: Multidetector CT imaging of  the head and cervical spine was performed following the standard protocol without intravenous contrast. Multiplanar CT image reconstructions of the cervical spine were also generated.  COMPARISON:  05/20/2012 MR. 05/02/2011 CT head. 08/04/2005 CT head and cervical spine CT.  FINDINGS: CT HEAD FINDINGS  No skull fracture or intracranial hemorrhage.  Prominent small vessel disease type changes without CT evidence of large acute infarct.  No intracranial mass lesion noted on this unenhanced exam.  Mild atrophy without hydrocephalus.  Vascular calcifications  CT CERVICAL SPINE FINDINGS  Head tilt to the left with curvature of the cervical spine. Rotation of C1 upon C2. If there is a high clinical suspicion of ligamentous injury, flexion and extension views or MR can be performed for further delineation.  No cervical spine fracture noted.  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  Well corticated left C3-4 foraminal bony structures seen on prior exam and felt unlikely to be related to acute injury.  Lung apices are clear.  Prominent carotid bifurcation calcifications.  IMPRESSION: No skull fracture or intracranial hemorrhage.  No cervical spine fracture.  Rotation of C1 upon C2 with head tilt to left as noted above.   Electronically Signed   By: Bridgett Larsson M.D.   On: 11/19/2012 10:43   Ct Cervical Spine Wo Contrast  11/19/2012   CLINICAL DATA:  Syncopal episode.  Fall.  EXAM: CT HEAD WITHOUT CONTRAST  CT CERVICAL SPINE WITHOUT CONTRAST  TECHNIQUE: Multidetector CT imaging of the head and cervical spine was performed following the standard protocol without intravenous contrast. Multiplanar CT image reconstructions of the cervical spine were also generated.  COMPARISON:  05/20/2012 MR. 05/02/2011 CT head. 08/04/2005 CT head and cervical spine CT.  FINDINGS: CT HEAD FINDINGS  No skull fracture or intracranial hemorrhage.  Prominent small vessel disease type changes  without CT evidence of large acute infarct.  No intracranial mass lesion noted on this unenhanced exam.  Mild atrophy without hydrocephalus.  Vascular calcifications  CT CERVICAL SPINE FINDINGS  Head tilt to the left with curvature of the cervical spine. Rotation of C1 upon C2. If there is a high clinical suspicion of ligamentous injury, flexion and extension views or MR can be performed for further delineation.  No cervical spine fracture noted.  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  Well corticated left C3-4 foraminal bony structures seen on prior exam and felt unlikely to be related to acute injury.  Lung apices are clear.  Prominent carotid bifurcation calcifications.  IMPRESSION: No skull fracture or intracranial hemorrhage.  No cervical spine fracture.  Rotation of C1 upon C2 with head tilt to left as noted above.   Electronically Signed   By: Bridgett Larsson M.D.   On: 11/19/2012 10:43   Dg Shoulder Left  11/19/2012   CLINICAL DATA:  Left shoulder pain, loss of consciousness.  EXAM: LEFT SHOULDER - 2+ VIEW  COMPARISON:  None.  FINDINGS: There is a comminuted fracture of the surgical neck of the left proximal humerus which also involves the greater tuberosity. There is apex anterior angulation. There is no glenohumeral dislocation. There are mild degenerative changes of the acromioclavicular joint.  IMPRESSION: Comminuted fracture of the surgical neck of the left proximal humerus.  Electronically Signed   By: Elige Ko   On: 11/19/2012 10:23    EKG: Independently reviewed. Sinus Rhythm, prolong QT.   Assessment/Plan Active Problems:   HYPERTENSION   Acute on chronic systolic CHF (congestive heart failure), NYHA class 4   Fracture of left humerus  1-Syncope: Differential vaso-vagal, orthostatic hypotension, electrolytes abnormalities. Admit to telemetry. Cycle cardiac enzymes. Hold BP medications for now. Replete potassium. Check  orthostatic vitals.   2-Hypokalemia: in setting of diuretic use and poor oral intake. Replete with 3 runs IV and 40 meq times one. Repeat B-met later. Check Mg level.   3-Systolic Heart Failure: Compensated. resume lasix. Monitor renal function.  4-Anemia: hb at baseline.  5-Left humerus fracture: Ortho consulted by ED. Tramadol for pain.  6-Encephalopathy: patient sedated after IV fentanyl. Will monitor.  7-Hyponatremia: Chronic, sodium at 129 on 09-17-2012. She received fluids in the ED. Will resume lasix.   Code Status: Discussed with husband, patient sedated. Patient is full code.  Family Communication: Care discussed with husband, over phone.  Disposition Plan: Expect 2 to 3 days inpatient.   Time spent: 70 minutes.   REGALADO,BELKYS [facility] Pager [phone]  If 7PM-7AM, please contact night-coverage [email or web address] Password [facility] 11/19/2012, 12:17 PM

## 2012-11-19 NOTE — ED Notes (Signed)
Patient transported to X-ray 

## 2012-11-19 NOTE — Progress Notes (Signed)
*  PRELIMINARY RESULTS* Vascular Ultrasound Lower extremity venous duplex has been completed.  Preliminary findings: no obvious evidence of DVT.   Farrel Demark, RDMS, RVT  11/19/2012, 1:48 PM

## 2012-11-19 NOTE — Consult Note (Signed)
Reason for Consult: Left shoulder pain Referring Physician: EDP  [name] is an 67 y.o. female.  HPI: Syncopal event this AM fell on L shoulder in her bathroom. Denies prior injury to this shoulder. Denies other injury from her fall. No neck pain. No numbness or tingling.   Past Medical History  Diagnosis Date  . DM   . DYSLIPIDEMIA   . OBESITY NOS   . DEPRESSION, RECURRENT, IN PARTIAL REMISSION   . HYPERTENSION   . 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 CONGESTIVE HEART FAILURE     EF 40-45% echo 05/2011  . GERD   . DIVERTICULOSIS, COLON, WITH HEMORRHAGE   . BACK PAIN, LUMBAR, WITH RADICULOPATHY   . OBSTRUCTIVE SLEEP APNEA   . Asthma   . Anemia, iron deficiency   . Hyponatremia 12/2011  . Tobacco abuse   . Moderate COPD (chronic obstructive pulmonary disease) 11/07/2005    Spirometry 03/09/11>>FEV1 2.02 (88%), FEV1% 76    . Anxiety   . Arthritis   . Blood transfusion without reported diagnosis 1970's  . Cataract   . Myocardial infarction 2001    Past Surgical History  Procedure Laterality Date  . Uvuloplasty    . Total abdominal hysterectomy w/ bilateral salpingoophorectomy    . Abdominal hysterectomy    . Cholecystectomy  2004  . Coronary angioplasty with stent placement  2002    last 04-29-12  . Esophagogastroduodenoscopy (egd) with propofol N/A 06/06/2012    Procedure: ESOPHAGOGASTRODUODENOSCOPY (EGD) WITH PROPOFOL;  Surgeon: Rachael Fee, MD;  Location: [facility];  Service: Endoscopy;  Laterality: N/A;    Family History  Problem Relation Age of Onset  . Stroke Mother   . Breast cancer Sister   . Emphysema Sister   . Cancer Sister     breast  . Alcohol abuse Brother   . Colon cancer      uncles x 2  . Prostate cancer Brother     Social History:  reports that she quit smoking about 4 months ago. Her smoking  use included Cigarettes. She has a 5 pack-year smoking history. She has never used smokeless tobacco. She reports that she does not drink alcohol or use illicit drugs.  Allergies:  Allergies  Allergen Reactions  . Amlodipine Other (See Comments)    constipation  . Ramipril Cough  . Adhesive [Tape] Hives    "peels skin off"  . Morphine And Related Other (See Comments)    Per Husband, Pt has hallucinations  . Penicillins Itching  . Codeine Rash    Medications: I have reviewed the patient's current medications.  Results for orders placed during the hospital encounter of 11/19/12 (from the past 48 hour(s))  GLUCOSE, CAPILLARY     Status: None   Collection Time    11/19/12  8:50 AM      Result Value Range   Glucose-Capillary 97  70 - 99 mg/dL  URINALYSIS, ROUTINE W REFLEX MICROSCOPIC     Status: None   Collection Time    11/19/12  9:03 AM      Result Value Range   Color, Urine YELLOW  YELLOW   APPearance CLEAR  CLEAR   Specific Gravity, Urine 1.008  1.005 - 1.030   pH 7.0  5.0 - 8.0   Glucose, UA NEGATIVE  NEGATIVE mg/dL  Hgb urine dipstick NEGATIVE  NEGATIVE   Bilirubin Urine NEGATIVE  NEGATIVE   Ketones, ur NEGATIVE  NEGATIVE mg/dL   Protein, ur NEGATIVE  NEGATIVE mg/dL   Urobilinogen, UA 1.0  0.0 - 1.0 mg/dL   Nitrite NEGATIVE  NEGATIVE   Leukocytes, UA NEGATIVE  NEGATIVE   Comment: MICROSCOPIC NOT DONE ON URINES WITH NEGATIVE PROTEIN, BLOOD, LEUKOCYTES, NITRITE, OR GLUCOSE <1000 mg/dL.  CBC WITH DIFFERENTIAL     Status: Abnormal   Collection Time    11/19/12  9:34 AM      Result Value Range   WBC 5.0  4.0 - 10.5 K/uL   RBC 3.10 (*) 3.87 - 5.11 MIL/uL   Hemoglobin 9.5 (*) 12.0 - 15.0 g/dL   HCT 16.1 (*) 09.6 - 04.5 %   MCV 86.8  78.0 - 100.0 fL   MCH 30.6  26.0 - 34.0 pg   MCHC 35.3  30.0 - 36.0 g/dL   RDW 40.9 (*) 81.1 - 91.4 %   Platelets 260  150 - 400 K/uL   Neutrophils Relative % 82 (*) 43 - 77 %   Neutro Abs 4.1  1.7 - 7.7 K/uL   Lymphocytes Relative 12   12 - 46 %   Lymphs Abs 0.6 (*) 0.7 - 4.0 K/uL   Monocytes Relative 5  3 - 12 %   Monocytes Absolute 0.2  0.1 - 1.0 K/uL   Eosinophils Relative 1  0 - 5 %   Eosinophils Absolute 0.1  0.0 - 0.7 K/uL   Basophils Relative 0  0 - 1 %   Basophils Absolute 0.0  0.0 - 0.1 K/uL  COMPREHENSIVE METABOLIC PANEL     Status: Abnormal   Collection Time    11/19/12  9:34 AM      Result Value Range   Sodium 128 (*) 135 - 145 mEq/L   Potassium 2.8 (*) 3.5 - 5.1 mEq/L   Chloride 86 (*) 96 - 112 mEq/L   CO2 29  19 - 32 mEq/L   Glucose, Bld 108 (*) 70 - 99 mg/dL   BUN 24 (*) 6 - 23 mg/dL   Creatinine, Ser 7.82 (*) 0.50 - 1.10 mg/dL   Calcium 8.8  8.4 - 95.6 mg/dL   Total Protein 6.1  6.0 - 8.3 g/dL   Albumin 2.8 (*) 3.5 - 5.2 g/dL   AST 21  0 - 37 U/L   ALT 14  0 - 35 U/L   Alkaline Phosphatase 99  39 - 117 U/L   Total Bilirubin 0.5  0.3 - 1.2 mg/dL   GFR calc non Af Amer 46 (*) >90 mL/min   GFR calc Af Amer 53 (*) >90 mL/min   Comment: (NOTE)     The eGFR has been calculated using the CKD EPI equation.     This calculation has not been validated in all clinical situations.     eGFR's persistently <90 mL/min signify possible Chronic Kidney     Disease.  POCT I-STAT TROPONIN I     Status: None   Collection Time    11/19/12  9:49 AM      Result Value Range   Troponin i, poc 0.06  0.00 - 0.08 ng/mL   Comment 3            Comment: Due to the release kinetics of cTnI,     a negative result within the first hours     of the onset of symptoms does not rule out  myocardial infarction with certainty.     If myocardial infarction is still suspected,     repeat the test at appropriate intervals.    Dg Chest 2 View  11/19/2012   CLINICAL DATA:  Lost consciousness, left shoulder pain.  EXAM: CHEST  2 VIEW  COMPARISON:  08/01/2012  FINDINGS: The heart size is enlarged. There is bilateral interstitial thickening likely chronic. There is no focal consolidation or pneumothorax. There is a trace right  pleural effusion versus scarring.  There is a comminuted fracture of the left surgical neck of the proximal humerus.  IMPRESSION: Comminuted fracture of the left surgical neck of the proximal humerus.  Cardiomegaly.   Electronically Signed   By: Elige Ko   On: 11/19/2012 10:21   Ct Head Wo Contrast  11/19/2012   CLINICAL DATA:  Syncopal episode.  Fall.  EXAM: CT HEAD WITHOUT CONTRAST  CT CERVICAL SPINE WITHOUT CONTRAST  TECHNIQUE: Multidetector CT imaging of the head and cervical spine was performed following the standard protocol without intravenous contrast. Multiplanar CT image reconstructions of the cervical spine were also generated.  COMPARISON:  05/20/2012 MR. 05/02/2011 CT head. 08/04/2005 CT head and cervical spine CT.  FINDINGS: CT HEAD FINDINGS  No skull fracture or intracranial hemorrhage.  Prominent small vessel disease type changes without CT evidence of large acute infarct.  No intracranial mass lesion noted on this unenhanced exam.  Mild atrophy without hydrocephalus.  Vascular calcifications  CT CERVICAL SPINE FINDINGS  Head tilt to the left with curvature of the cervical spine. Rotation of C1 upon C2. If there is a high clinical suspicion of ligamentous injury, flexion and extension views or MR can be performed for further delineation.  No cervical spine fracture noted.  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  Well corticated left C3-4 foraminal bony structures seen on prior exam and felt unlikely to be related to acute injury.  Lung apices are clear.  Prominent carotid bifurcation calcifications.  IMPRESSION: No skull fracture or intracranial hemorrhage.  No cervical spine fracture.  Rotation of C1 upon C2 with head tilt to left as noted above.   Electronically Signed   By: Bridgett Larsson M.D.   On: 11/19/2012 10:43   Ct Cervical Spine Wo Contrast  11/19/2012   CLINICAL DATA:  Syncopal episode.  Fall.  EXAM: CT HEAD WITHOUT CONTRAST   CT CERVICAL SPINE WITHOUT CONTRAST  TECHNIQUE: Multidetector CT imaging of the head and cervical spine was performed following the standard protocol without intravenous contrast. Multiplanar CT image reconstructions of the cervical spine were also generated.  COMPARISON:  05/20/2012 MR. 05/02/2011 CT head. 08/04/2005 CT head and cervical spine CT.  FINDINGS: CT HEAD FINDINGS  No skull fracture or intracranial hemorrhage.  Prominent small vessel disease type changes without CT evidence of large acute infarct.  No intracranial mass lesion noted on this unenhanced exam.  Mild atrophy without hydrocephalus.  Vascular calcifications  CT CERVICAL SPINE FINDINGS  Head tilt to the left with curvature of the cervical spine. Rotation of C1 upon C2. If there is a high clinical suspicion of ligamentous injury, flexion and extension views or MR can be performed for further delineation.  No cervical spine fracture noted.  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  Well corticated left C3-4 foraminal bony structures seen on prior exam and felt unlikely to be related to acute injury.  Lung apices are clear.  Prominent carotid bifurcation calcifications.  IMPRESSION: No skull fracture or intracranial hemorrhage.  No cervical spine fracture.  Rotation of C1 upon C2 with head tilt to left as noted above.   Electronically Signed   By: Bridgett Larsson M.D.   On: 11/19/2012 10:43   Dg Shoulder Left  11/19/2012   CLINICAL DATA:  Left shoulder pain, loss of consciousness.  EXAM: LEFT SHOULDER - 2+ VIEW  COMPARISON:  None.  FINDINGS: There is a comminuted fracture of the surgical neck of the left proximal humerus which also involves the greater tuberosity. There is apex anterior angulation. There is no glenohumeral dislocation. There are mild degenerative changes of the acromioclavicular joint.  IMPRESSION: Comminuted fracture of the surgical neck of the left proximal humerus.    Electronically Signed   By: Elige Ko   On: 11/19/2012 10:23    Review of Systems  Constitutional: Negative.   HENT: Negative.   Eyes: Negative.   Respiratory: Negative.   Cardiovascular: Negative.   Gastrointestinal: Negative.   Genitourinary: Negative.   Musculoskeletal: Positive for falls and joint pain.  Skin: Negative.   Neurological: Positive for dizziness.  All other systems reviewed and are negative.   Blood pressure 159/82, pulse 86, temperature 98.7 F (37.1 C), temperature source Oral, resp. rate 18, SpO2 97.00%. Physical Exam  Constitutional: She is oriented to person, place, and time. She appears well-developed and well-nourished.  HENT:  Head: Normocephalic.  Eyes: Conjunctivae and EOM are normal. Pupils are equal, round, and reactive to light.  Neck: Normal range of motion. Neck supple.  Cardiovascular: Normal rate and regular rhythm.   Respiratory: Effort normal and breath sounds normal.  GI: Soft. Bowel sounds are normal.  Musculoskeletal:  Pain with palpation left shoulder with edema. Neurovascularly intact. Sensation intact Able to flex and extend fingers and wrist. Elbow, wrist, hand nontender. Compartments soft, no evidence of compartment syndrome  Neurological: She is alert and oriented to person, place, and time.  Skin: Skin is warm and dry.  Psychiatric: She has a normal mood and affect.    xrays L shoulder reviewed by Dr. Shelle Iron with displaced, impacted, comminuted L proximal humerus fx  Assessment/Plan: Displaced left proximal humerus fracture. Sling, Ice. Plan ORIF when stable. Dr. Shelle Iron discussed with Dr. Ranell Patrick. OK to D/C if syncopal W/U negative and See Dr. Ranell Patrick in office by early next week  Andrez Grime PA-C for Dr. Elenore Paddy C 11/19/2012, 1:06 PM

## 2012-11-20 ENCOUNTER — Encounter (HOSPITAL_COMMUNITY): Payer: Self-pay | Admitting: Physician Assistant

## 2012-11-20 DIAGNOSIS — E871 Hypo-osmolality and hyponatremia: Secondary | ICD-10-CM | POA: Diagnosis present

## 2012-11-20 DIAGNOSIS — I440 Atrioventricular block, first degree: Secondary | ICD-10-CM | POA: Diagnosis present

## 2012-11-20 DIAGNOSIS — I441 Atrioventricular block, second degree: Secondary | ICD-10-CM

## 2012-11-20 DIAGNOSIS — Z87898 Personal history of other specified conditions: Secondary | ICD-10-CM

## 2012-11-20 DIAGNOSIS — R55 Syncope and collapse: Secondary | ICD-10-CM | POA: Diagnosis present

## 2012-11-20 DIAGNOSIS — E876 Hypokalemia: Secondary | ICD-10-CM | POA: Diagnosis present

## 2012-11-20 DIAGNOSIS — I951 Orthostatic hypotension: Secondary | ICD-10-CM

## 2012-11-20 DIAGNOSIS — Z01810 Encounter for preprocedural cardiovascular examination: Secondary | ICD-10-CM

## 2012-11-20 DIAGNOSIS — I2589 Other forms of chronic ischemic heart disease: Secondary | ICD-10-CM

## 2012-11-20 LAB — COMPREHENSIVE METABOLIC PANEL
Albumin: 2.5 g/dL — ABNORMAL LOW (ref 3.5–5.2)
BUN: 20 mg/dL (ref 6–23)
Chloride: 89 mEq/L — ABNORMAL LOW (ref 96–112)
Creatinine, Ser: 0.97 mg/dL (ref 0.50–1.10)
GFR calc Af Amer: 68 mL/min — ABNORMAL LOW (ref >90)
Glucose, Bld: 90 mg/dL (ref 70–99)
Total Bilirubin: 0.5 mg/dL (ref 0.3–1.2)
Total Protein: 5.5 g/dL — ABNORMAL LOW (ref 6.0–8.3)

## 2012-11-20 LAB — CBC
Hemoglobin: 8.7 g/dL — ABNORMAL LOW (ref 12.0–15.0)
MCV: 87.2 fL (ref 78.0–100.0)
Platelets: 276 10*3/uL (ref 150–400)
RBC: 2.88 MIL/uL — ABNORMAL LOW (ref 3.87–5.11)
WBC: 3.9 10*3/uL — ABNORMAL LOW (ref 4.0–10.5)

## 2012-11-20 LAB — GLUCOSE, CAPILLARY
Glucose-Capillary: 100 mg/dL — ABNORMAL HIGH (ref 70–99)
Glucose-Capillary: 120 mg/dL — ABNORMAL HIGH (ref 70–99)
Glucose-Capillary: 134 mg/dL — ABNORMAL HIGH (ref 70–99)
Glucose-Capillary: 127 mg/dL — ABNORMAL HIGH (ref 70–99)

## 2012-11-20 LAB — TSH: TSH: 2.427 u[IU]/mL (ref 0.350–4.500)

## 2012-11-20 LAB — ANGIOTENSIN CONVERTING ENZYME: Angiotensin-Converting Enzyme: 40 U/L (ref 8–52)

## 2012-11-20 LAB — MAGNESIUM: Magnesium: 1.4 mg/dL — ABNORMAL LOW (ref 1.5–2.5)

## 2012-11-20 MED ORDER — LOSARTAN POTASSIUM 50 MG PO TABS
50.0000 mg | ORAL_TABLET | Freq: Every day | ORAL | Status: DC
  Administered 2012-11-20 – 2012-11-23 (×4): 50 mg via ORAL
  Filled 2012-11-20 (×4): qty 1

## 2012-11-20 MED ORDER — MAGNESIUM SULFATE 40 MG/ML IJ SOLN
2.0000 g | Freq: Once | INTRAMUSCULAR | Status: AC
  Administered 2012-11-20: 2 g via INTRAVENOUS
  Filled 2012-11-20: qty 50

## 2012-11-20 MED ORDER — SPIRONOLACTONE 12.5 MG HALF TABLET
12.5000 mg | ORAL_TABLET | Freq: Every day | ORAL | Status: DC
  Administered 2012-11-20 – 2012-11-23 (×4): 12.5 mg via ORAL
  Filled 2012-11-20 (×4): qty 1

## 2012-11-20 MED ORDER — POTASSIUM CHLORIDE CRYS ER 20 MEQ PO TBCR
40.0000 meq | EXTENDED_RELEASE_TABLET | Freq: Four times a day (QID) | ORAL | Status: AC
  Administered 2012-11-20 (×3): 40 meq via ORAL
  Filled 2012-11-20 (×3): qty 2

## 2012-11-20 MED ORDER — HYDROCODONE-ACETAMINOPHEN 5-325 MG PO TABS
1.0000 | ORAL_TABLET | ORAL | Status: DC | PRN
  Administered 2012-11-20 – 2012-11-22 (×6): 1 via ORAL
  Filled 2012-11-20 (×6): qty 1

## 2012-11-20 MED ORDER — POTASSIUM CHLORIDE CRYS ER 20 MEQ PO TBCR
40.0000 meq | EXTENDED_RELEASE_TABLET | Freq: Every day | ORAL | Status: DC
  Administered 2012-11-21 – 2012-11-23 (×3): 40 meq via ORAL
  Filled 2012-11-20 (×3): qty 2

## 2012-11-20 NOTE — Progress Notes (Signed)
VASCULAR LAB PRELIMINARY  PRELIMINARY  PRELIMINARY  PRELIMINARY  Carotid duplex completed.    Preliminary report:  Bilateral:  1-39% ICA stenosis.  Vertebral artery flow is antegrade.      Verland Sprinkle, RVT 11/20/2012, 3:26 PM

## 2012-11-20 NOTE — Consult Note (Signed)
ELECTROPHYSIOLOGY CONSULT NOTE  Patient ID: [name], MRN: [id number], DOB/AGE: 68-Sep-1947 68 y.o. Admit date: 11/19/2012 Date of Consult: 11/20/2012  Primary Physician: [name], MD Primary Cardiologist: [facility]  Chief Complaint:  syncope   HPI [name] is a 67 y.o. female  Admitted following a syncopal episode that occurred in the bathroom upon arising from the commode that was associated with shoulder trauma. He is expected to have shoulder repair surgery.  She has a history of recurrent syncope. These have been relatively infrequent; however, she has had multiple episodes of presyncope. These are associated with arising from the commode. They have also occurred upon standing, and showers, but notably some have occurred while seated and some are associated with tachypalpitations. She was admitted in April and found to have second degree AV block the context of AV block physiology. Beta blockers were discontinued. She undergone evaluation of left ventricular function. Included catheterization which demonstrated ejection fraction of about 20% with patent stents; a nuclear medicine scan demonstrated ejection fraction 14% with prior MI. An echocardiogram however, showed an ejection fraction of 40-45%. For reasons that are not clear to me she is on hospice  She has a history of significant weight loss but the weight has been stable over the last 6 months     Past Medical History  Diagnosis Date  . DM   . DYSLIPIDEMIA   . OBESITY NOS   . DEPRESSION, RECURRENT, IN PARTIAL REMISSION   . HYPERTENSION   . CORONARY ARTERY DISEASE     a. s/p Q wave MI 2002 with RCA stent. b. Abnl nuc 04/2012 with subsequent cath showing patent stents in RCA, chronic diffuse disease in dLAD, 20% LM, EF 20%, no focal targets for PCI.  [name] Chronic systolic CHF (congestive heart failure)     a. Last EF 20% by cath 04/29/2012, 40-45% by echo 04/24/12.  [name] GERD   . DIVERTICULOSIS, COLON, WITH  HEMORRHAGE   . BACK PAIN, LUMBAR, WITH RADICULOPATHY   . OBSTRUCTIVE SLEEP APNEA   . Anemia, iron deficiency   . Hyponatremia   . Tobacco abuse   . Moderate COPD (chronic obstructive pulmonary disease) 11/07/2005    Spirometry 03/09/11>>FEV1 2.02 (88%), FEV1% 76    . Anxiety   . Arthritis   . Myocardial infarction 2001  . Dementia     a. Saw neuro 08/2012: concerning for early dementia, labs unremarkable except elev CRP.  [name] Syncope     a. 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 BB discontinued. b. Recurrence in 11/2012.  [name] Chronic edema   . Weight loss     CT abd pelvis 05/2012: mild small bowel disention. No significant acute changes. Mod-severe gastritis by EGD 06/06/2012 no clear neoplastic changes. Neg small bowel follow through except for diverticulum on 06/11/12.   . Gastritis     a. by EGD 05/2012.      Surgical History:  Past Surgical History  Procedure Laterality Date  . Uvuloplasty    . Total abdominal hysterectomy w/ bilateral salpingoophorectomy    . Abdominal hysterectomy    . Cholecystectomy  2004  . Coronary angioplasty with stent placement  2002    last 04-29-12  . Esophagogastroduodenoscopy (egd) with propofol N/A 06/06/2012    Procedure: ESOPHAGOGASTRODUODENOSCOPY (EGD) WITH PROPOFOL;  Surgeon: Rachael Fee, MD;  Location: [facility];  Service: Endoscopy;  Laterality: N/A;     Home Meds: Prior to Admission medications   Medication Sig Start Date End  Date Taking? Authorizing Provider  acetaminophen (TYLENOL) 500 MG tablet Take 500 mg by mouth every 6 (six) hours as needed for pain.   Yes Historical Provider, MD  albuterol (PROVENTIL) (2.5 MG/3ML) 0.083% nebulizer solution Take 2.5 mg by nebulization every 6 (six) hours as needed for wheezing.   Yes Historical Provider, MD  aspirin 81 MG tablet Take 81 mg by mouth daily.     Yes Historical Provider, MD  atorvastatin (LIPITOR) 40 MG tablet Take 40 mg by mouth every morning.   Yes Historical Provider,  MD  diclofenac sodium (VOLTAREN) 1 % GEL Apply 2 g topically 4 (four) times daily. 06/12/12  Yes Karen Prueter, PA-C  feeding supplement (GLUCERNA SHAKE) LIQD Take 237 mLs by mouth as needed.    Yes Historical Provider, MD  furosemide (LASIX) 80 MG tablet Take 160 mg by mouth 2 (two) times daily.   Yes Historical Provider, MD  gabapentin (NEURONTIN) 300 MG capsule Take 1 capsule (300 mg total) by mouth 2 (two) times daily. 06/12/12  Yes Clydie Braun Prueter, PA-C  hydrALAZINE (APRESOLINE) 50 MG tablet Take 50 mg by mouth every 8 (eight) hours.   Yes Historical Provider, MD  hydrOXYzine (ATARAX/VISTARIL) 10 MG tablet Take 10 mg by mouth 2 (two) times daily.   Yes Historical Provider, MD  Iron-Folic Acid-B12-C-Docusate (FERRAPLUS 90) 90-1 MG TABS Take 1 tablet by mouth daily. 10/30/12  Yes [name], MD  isosorbide mononitrate (IMDUR) 60 MG 24 hr tablet Take 60 mg by mouth daily at 12 noon.   Yes Historical Provider, MD  Lancet Devices (SIMPLE DIAGNOSTICS LANCING DEV) MISC  10/01/12  Yes Historical Provider, MD  losartan (COZAAR) 50 MG tablet Take 1 tablet (50 mg total) by mouth daily. 10/30/12  Yes [name], MD  metolazone (ZAROXOLYN) 2.5 MG tablet Take 1 tablet (2.5 mg total) by mouth 3 (three) times a week. 09/04/12  Yes Dyann Kief, PA-C  nitroGLYCERIN (NITROSTAT) 0.4 MG SL tablet Place 1 tablet (0.4 mg total) under the tongue every 5 (five) minutes x 3 doses as needed for chest pain. 04/30/12  Yes Rhonda G Barrett, PA-C  omeprazole (PRILOSEC) 40 MG capsule Take 40 mg by mouth daily.   Yes Historical Provider, MD  polyethylene glycol (MIRALAX / GLYCOLAX) packet Take 17 g by mouth daily as needed (for constipation).   Yes Historical Provider, MD  potassium chloride (K-DUR,KLOR-CON) 10 MEQ tablet Take 10-20 mEq by mouth See admin instructions. Take 3 tablets twice daily on days that Metolazone is taken. Take 2 tablets twice daily on other days   Yes Historical Provider, MD  sitaGLIPtan-metformin  (JANUMET) 50-500 MG per tablet Take 1 tablet by mouth 2 (two) times daily with a meal.   Yes Historical Provider, MD  spironolactone (ALDACTONE) 25 MG tablet Take 1 tablet (25 mg total) by mouth daily. 11/18/12  Yes Rosalio Macadamia, NP  tiotropium (SPIRIVA) 18 MCG inhalation capsule Place 18 mcg into inhaler and inhale daily.   Yes Historical Provider, MD  traMADol (ULTRAM) 50 MG tablet Take 50-100 mg by mouth every 6 (six) hours as needed for moderate pain.   Yes Historical Provider, MD  traZODone (DESYREL) 100 MG tablet Take 150 mg by mouth at bedtime.   Yes Historical Provider, MD  venlafaxine XR (EFFEXOR-XR) 150 MG 24 hr capsule Take 150 mg by mouth every morning.   Yes Historical Provider, MD    Inpatient Medications:  . aspirin EC  81 mg Oral Daily  . atorvastatin  40 mg Oral q1800  . diclofenac sodium  2 g Topical QID  . ferrous fumarate-b12-vitamic C-folic acid  1 capsule Oral QPC breakfast  . furosemide  160 mg Oral BID  . gabapentin  300 mg Oral BID  . heparin  5,000 Units Subcutaneous Q8H  . isosorbide mononitrate  60 mg Oral Q1200  . losartan  50 mg Oral Daily  . pantoprazole  40 mg Oral Daily  . potassium chloride  40 mEq Oral Q6H  . sodium chloride  3 mL Intravenous Q12H  . sodium chloride  3 mL Intravenous Q12H  . spironolactone  12.5 mg Oral Daily  . tiotropium  18 mcg Inhalation Daily  . traZODone  150 mg Oral QHS    Allergies:  Allergies  Allergen Reactions  . Amlodipine Other (See Comments)    constipation  . Ramipril Cough  . Adhesive [Tape] Hives    "peels skin off"  . Morphine And Related Other (See Comments)    Per Husband, Pt has hallucinations  . Penicillins Itching  . Codeine Rash    History   Social History  . Marital Status: Married    Spouse Name: N/A    Number of Children: 1  . Years of Education: 12   Occupational History  . Retired Insurance account manager for [employer]    Social History Main Topics  . Smoking status: Former  Smoker -- 0.25 packs/day for 20 years    Types: Cigarettes    Quit date: 07/17/2012  . Smokeless tobacco: Never Used     Comment: Still smokes a little.  . Alcohol Use: No     Comment: Patient drinks cafinated daily.  . Drug Use: No  . Sexual Activity: Not Currently    Birth Control/ Protection: Surgical   Other Topics Concern  . Not on file   Social History Narrative  . No narrative on file     Family History  Problem Relation Age of Onset  . Stroke Mother   . Breast cancer Sister   . Emphysema Sister   . Cancer Sister     breast  . Alcohol abuse Brother   . Colon cancer      uncles x 2  . Prostate cancer Brother      ROS:  Please see the history of present illness.     All other systems reviewed and negative.    Physical Exam:   Blood pressure 127/70, pulse 80, temperature 98.5 F (36.9 C), temperature source Oral, resp. rate 18, height 5\' 9"  (1.753 m), weight 150 lb 5.7 oz (68.2 kg), SpO2 97.00%. General: Well developed, well nourished age appearing african [place]  female in no acute distress. Head: Normocephalic, atraumatic, sclera non-icteric, no xanthomas, nares are without discharge. EENT: normal Lymph Nodes:  none Back: without scoliosis/kyphosis, no CVA tendersness Neck: Negative for carotid bruits. JVD not elevated. Lungs: Clear bilaterally to auscultation without wheezes, rales, or rhonchi. Breathing is unlabored. Heart: RRR with S1 S2.  2/6 systolic murmur , rubs, or gallops appreciated. Abdomen: Soft, non-tender, non-distended with normoactive bowel sounds. No hepatomegaly. No rebound/guarding. No obvious abdominal masses. Msk:  Strength and tone appear normal for age. Extremities: No clubbing or cyanosis. No edema.  Distal pedal pulses are 2+ and equal bilaterally. Skin: Warm and Dry Neuro: Alert and oriented X 3. CN III-XII intact Grossly normal sensory and motor function . Psych:  Responds to questions appropriately with a normal affect.       Labs: Cardiac Enzymes  Recent Labs  11/19/12 1206 11/19/12 1954 11/20/12 0005  TROPONINI <0.30 <0.30 <0.30   CBC Lab Results  Component Value Date   WBC 3.9* 11/20/2012   HGB 8.7* 11/20/2012   HCT 25.1* 11/20/2012   MCV 87.2 11/20/2012   PLT 276 11/20/2012   PROTIME: No results found for this basename: LABPROT, INR,  in the last 72 hours Chemistry  Recent Labs Lab 11/20/12 0522  NA 129*  K 2.9*  CL 89*  CO2 34*  BUN 20  CREATININE 0.97  CALCIUM 8.6  PROT 5.5*  BILITOT 0.5  ALKPHOS 87  ALT 11  AST 16  GLUCOSE 90   Lipids Lab Results  Component Value Date   CHOL 125 08/22/2012   HDL 50.30 08/22/2012   LDLCALC 62 08/22/2012   TRIG 65.0 08/22/2012   BNP Pro B Natriuretic peptide (BNP)  Date/Time Value Range Status  11/18/2012  4:08 PM 1238.0* 0.0 - 100.0 pg/mL Final  08/14/2012  9:46 AM 1536.0* 0.0 - 100.0 pg/mL Final  08/01/2012  9:02 AM 1286.0* 0.0 - 100.0 pg/mL Final  04/23/2012  9:45 AM 11818.0* 0 - 125 pg/mL Final   Miscellaneous Lab Results  Component Value Date   DDIMER 1.44* 09/07/2011    Radiology/Studies:  Dg Chest 2 View  11/19/2012   CLINICAL DATA:  Lost consciousness, left shoulder pain.  EXAM: CHEST  2 VIEW  COMPARISON:  08/01/2012  FINDINGS: The heart size is enlarged. There is bilateral interstitial thickening likely chronic. There is no focal consolidation or pneumothorax. There is a trace right pleural effusion versus scarring.  There is a comminuted fracture of the left surgical neck of the proximal humerus.  IMPRESSION: Comminuted fracture of the left surgical neck of the proximal humerus.  Cardiomegaly.   Electronically Signed   By: Elige Ko   On: 11/19/2012 10:21   Ct Head Wo Contrast  11/19/2012   CLINICAL DATA:  Syncopal episode.  Fall.  EXAM: CT HEAD WITHOUT CONTRAST  CT CERVICAL SPINE WITHOUT CONTRAST  TECHNIQUE: Multidetector CT imaging of the head and cervical spine was performed following the standard protocol without  intravenous contrast. Multiplanar CT image reconstructions of the cervical spine were also generated.  COMPARISON:  05/20/2012 MR. 05/02/2011 CT head. 08/04/2005 CT head and cervical spine CT.  FINDINGS: CT HEAD FINDINGS  No skull fracture or intracranial hemorrhage.  Prominent small vessel disease type changes without CT evidence of large acute infarct.  No intracranial mass lesion noted on this unenhanced exam.  Mild atrophy without hydrocephalus.  Vascular calcifications  CT CERVICAL SPINE FINDINGS  Head tilt to the left with curvature of the cervical spine. Rotation of C1 upon C2. If there is a high clinical suspicion of ligamentous injury, flexion and extension views or MR can be performed for further delineation.  No cervical spine fracture noted.  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  Well corticated left C3-4 foraminal bony structures seen on prior exam and felt unlikely to be related to acute injury.  Lung apices are clear.  Prominent carotid bifurcation calcifications.  IMPRESSION: No skull fracture or intracranial hemorrhage.  No cervical spine fracture.  Rotation of C1 upon C2 with head tilt to left as noted above.   Electronically Signed   By: Bridgett Larsson M.D.   On: 11/19/2012 10:43   Ct Cervical Spine Wo Contrast  11/19/2012   CLINICAL DATA:  Syncopal episode.  Fall.  EXAM: CT HEAD WITHOUT CONTRAST  CT  CERVICAL SPINE WITHOUT CONTRAST  TECHNIQUE: Multidetector CT imaging of the head and cervical spine was performed following the standard protocol without intravenous contrast. Multiplanar CT image reconstructions of the cervical spine were also generated.  COMPARISON:  05/20/2012 MR. 05/02/2011 CT head. 08/04/2005 CT head and cervical spine CT.  FINDINGS: CT HEAD FINDINGS  No skull fracture or intracranial hemorrhage.  Prominent small vessel disease type changes without CT evidence of large acute infarct.  No intracranial mass lesion noted on  this unenhanced exam.  Mild atrophy without hydrocephalus.  Vascular calcifications  CT CERVICAL SPINE FINDINGS  Head tilt to the left with curvature of the cervical spine. Rotation of C1 upon C2. If there is a high clinical suspicion of ligamentous injury, flexion and extension views or MR can be performed for further delineation.  No cervical spine fracture noted.  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  Well corticated left C3-4 foraminal bony structures seen on prior exam and felt unlikely to be related to acute injury.  Lung apices are clear.  Prominent carotid bifurcation calcifications.  IMPRESSION: No skull fracture or intracranial hemorrhage.  No cervical spine fracture.  Rotation of C1 upon C2 with head tilt to left as noted above.   Electronically Signed   By: Bridgett Larsson M.D.   On: 11/19/2012 10:43   Dg Shoulder Left  11/19/2012   CLINICAL DATA:  Left shoulder pain, loss of consciousness.  EXAM: LEFT SHOULDER - 2+ VIEW  COMPARISON:  None.  FINDINGS: There is a comminuted fracture of the surgical neck of the left proximal humerus which also involves the greater tuberosity. There is apex anterior angulation. There is no glenohumeral dislocation. There are mild degenerative changes of the acromioclavicular joint.  IMPRESSION: Comminuted fracture of the surgical neck of the left proximal humerus.   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 This was found to be concordant by Dr. Edwin Cap. Pathology was relayed to the patient by telephone. The patient reported doing well after the biopsy with tenderness at the site. Post biopsy instructions were reviewed and her questions were answered. She was encouraged to  call [facility] for any additional concerns. She was asked to return in 6 months for diagnostic right mammography.  Pathology results were reported by Sonnie Alamo RN, BSN on November 08, 2012.   Electronically Signed   By: Jerene Dilling M.D.   On: 11/08/2012 10:03   11/11/2012   CLINICAL DATA:  Right breast calcifications.  EXAM: STEREOTACTIC VACUUM ASSIST RIGHT  COMPARISON:  Previous exams.  FINDINGS: I met with the patient and we discussed the procedure of stereotactic-guided biopsy, including benefits and alternatives. We discussed the high likelihood of a successful procedure. We discussed the risks of the procedure, including infection, bleeding, tissue injury, clip migration, and inadequate sampling. Informed, written consent was given. The usual time out protocol was performed immediately prior to the procedure.  Using sterile technique and 2% Lidocaine as local anesthetic, under stereotactic guidance, a 9 gauge Suros device was used to perform core needle biopsy of calcifications at 5 o'clock in the right breast using a medial to lateral approach. Specimen radiograph was performed, showing calcification. Specimens with calcifications are  identified for pathology.  At the conclusion of the procedure, a dumbbell-shaped tissue marker clip was deployed into the biopsy cavity. Follow-up 2-view mammogram confirmed the clip to be in the appropriate position.  IMPRESSION: Stereotactic-guided biopsy of right breast calcifications. No apparent complications.  Electronically Signed: By: Jerene Dilling M.D. On: 11/07/2012 08:57    EKG: sinus 1AVB and some blocked PACs  Assessment and Plan:   Syncope  Ischemic Cardiomyopathy  2:1 AVblock in spring now with 1AVB with Mobitz physiology  Syncope-recurrent  Post micturition/defecation/OI  Tachy palpitations  CHF class 3  Hypokalemia/hypomagnesiumemia   The patient has a history of recurrent syncopal episodes of  which have been associated with post micturition/defecation. She also has orthostatic intolerance as well as heat intolerance. These have  been associated with trauma. I suspect that these are neurally mediated               However, she also has a history of significant structural heart disease with ejection fractions measured last spring the range of 15-40% with contrast demonstrating 20% and wall motion abnormalities. She has a history of tachycardia palpitations associated with lightheadedness and has also had presyncope unassociated with changes in position and/or other bathroom triggers.  I agree with the need to reassess left ventricular function. In the event that her LV function is poor, I think that consideration for ICD is appropriate and pacing functions would need to be determined at a later date but would vavor CRT.  In the event that her LV function is reasonable, EP testing and implantable loop recorder would be   appropriate alternative.  She needs to understand the physiology of post micturition/defecation syncope posterior try to protect herself. She is using a chair in the shower .   I have no reason to proceed with her surgery, and I don't think that her hypokalemia/hypomagnesiumemia  is contributing to her syncopal episodes;  this will need to be corrected however prior to surgery  She has significant antegrade conduction system disease and this may be contributing to congestive symptoms       *Sherryl Manges

## 2012-11-20 NOTE — Progress Notes (Signed)
Room [facility] 2W26 - [place] - [facility] RN Visit-R.Naarah Borgerding RN  Related admission to [facility] diagnosis of CAD.  Pt is FULL code.    Pt alert, appears oriented- answers questions appropriately and remembers events of fall.  Pt lying upright in bed, with complaints of pain in LUE - humerus area. Discussed with staff RN - pt's sling is mal aligned for stabilizing her humeral fracture- RN Vernona Rieger repositioned sling.  Dr. Patty Sermons present during visit.   No family present. Patient's home medication list is on shadow chart.   Pt having cardiac workup to determine clearance for orthopedic surgery.  Possible work up for unintentional wt loss documented 2008 as 230lbs, 2013 wt 185lbs and during this hospitalization, wt documented as 68.2kg = 150 lbs.   Please call [facility] @ [phone]- ask for RN Liaison or after hours,ask for on-call RN with any hospice needs.   Thank you.  Joneen Boers, RN  [facility]  Hospice Liaison  [phone])

## 2012-11-20 NOTE — Progress Notes (Signed)
Chaplain offered emotional support to patient. Patient was afraid and concerned about her upcoming surgery tomorrow. The patient uttered the words "Lord help Me". The patient was very happy that the chaplain paid her a visit today and asked the chaplain to pray with her before it was time to leave. The chaplain prayed for the patient.  Chaplain Bryson Ha Antwain Caliendo

## 2012-11-20 NOTE — Care Management Note (Signed)
    Page 1 of 2   11/22/2012     1:05:50 PM   CARE MANAGEMENT NOTE 11/22/2012  Patient:  [name], [name]   Account Number:  [id number]  Date Initiated:  11/20/2012  Documentation initiated by:  AMERSON,JULIE  Subjective/Objective Assessment:   PT ADM WITH CHF, SYNCOPE WITH FALL.  PTA, PT INDEPENDENT, LIVES WITH SPOUSE.     Action/Plan:   WILL FOLLOW FOR DISCHARGE NEEDS AS PT PROGRESSES.   Anticipated DC Date:  11/23/2012   Anticipated DC Plan:  HOME W HOSPICE CARE      DC Planning Services  CM consult      [facility] Choice  Resumption Of Svcs/PTA Provider   Choice offered to / List presented to:     DME arranged  VEST - LIFE VEST      DME agency  OTHER - SEE NOTE     HH arranged  HH-1 RN  HH-6 SOCIAL WORKER      HH agency  HOSPICE   Status of service:  Completed, signed off Medicare Important Message given?   (If response is "NO", the following Medicare IM given date fields will be blank) Date Medicare IM given:   Date Additional Medicare IM given:    Discharge Disposition:  HOME W HOSPICE CARE  Per UR Regulation:  Reviewed for med. necessity/level of care/duration of stay  If discussed at Long Length of Stay Meetings, dates discussed:    Comments:  11/21 1304 debbie Christoper Bushey rn,bsn spoke w Caryl Asp zoll life vest rep. md have contacted her and paerwork in process. for dc on 11-22. [facility] w [facility] aware for dc in am.

## 2012-11-20 NOTE — Progress Notes (Signed)
[facility]                                                                                Patient Demographics  [name], is a 67 y.o. female, DOB - 10/08/1945, ZOX:[id number]  Admit date - 11/19/2012   Admitting Physician Alba Cory, MD  Outpatient Primary MD for the patient is [name], MD  LOS - 1   Chief Complaint  Patient presents with  . Loss of Consciousness  . Fall        Assessment & Plan    1-Syncope: She and due to underlying Wenkebach.heart block, cardiology see the patient, PT to evaluate. Once cleared from cardiology standpoint will be discharged home.     2-Hypokalemia and Low Mag - replace and stable      3-Systolic Heart Failure: Compensated. resume lasix. Monitor renal function. Cardiology following    4-Anemia: hb at baseline.     5-Left humerus fracture: Ortho consulted by ED. in control, continue arm in sling, outpatient followup with Dr. Ranell Patrick surgery will be scheduled as outpatient per orthopedics.     6-Encephalopathy: Secondary to narcotics, resolved.     7-Hyponatremia: Chronic, sodium at 129 on 09-17-2012. She received fluids in the ED. Will resume lasix.      Code Status: Full  Family Communication: Husband  Disposition Plan: Home   Procedures echo gram   Consults  orthopedics Dr. Jillyn Hidden and Dr. Ranell Patrick, Dr. Patty Sermons cardiologist, EP consult also made   Medications  Scheduled Meds: . aspirin EC  81 mg Oral Daily  . atorvastatin  40 mg Oral q1800  . diclofenac sodium  2 g Topical QID  . ferrous fumarate-b12-vitamic C-folic acid  1 capsule Oral QPC breakfast  . furosemide  160 mg Oral BID  . gabapentin  300 mg Oral BID  . heparin  5,000 Units Subcutaneous Q8H  . isosorbide mononitrate  60 mg Oral Q1200  . losartan  50 mg Oral Daily  . pantoprazole  40 mg Oral Daily  . potassium chloride  40 mEq Oral Q6H  . sodium chloride  3 mL Intravenous Q12H  . sodium chloride  3 mL Intravenous Q12H   . spironolactone  12.5 mg Oral Daily  . tiotropium  18 mcg Inhalation Daily  . traZODone  150 mg Oral QHS   Continuous Infusions:  PRN Meds:.sodium chloride, acetaminophen, albuterol, nitroGLYCERIN, ondansetron (ZOFRAN) IV, ondansetron, polyethylene glycol, sodium chloride, traMADol  DVT Prophylaxis   Heparin   Lab Results  Component Value Date   PLT 276 11/20/2012    Antibiotics     Anti-infectives   None          Subjective:   [name] today has, No headache, No chest pain, No abdominal pain - No Nausea, No new weakness tingling or numbness, No Cough - SOB. The left arm pain  Objective:   Filed Vitals:   11/20/12 0422 11/20/12 0633 11/20/12 0758 11/20/12 1336  BP:  133/67  127/70  Pulse:  80  80  Temp:  98.7 F (37.1 C)  98.5 F (36.9 C)  TempSrc:  Oral  Oral  Resp:  18  18  Height: 5\' 9"  (  1.753 m)     Weight: 68.2 kg (150 lb 5.7 oz)     SpO2:  96% 97% 97%    Wt Readings from Last 3 Encounters:  11/20/12 68.2 kg (150 lb 5.7 oz)  11/18/12 69.582 kg (153 lb 6.4 oz)  10/30/12 68.04 kg (150 lb)     Intake/Output Summary (Last 24 hours) at 11/20/12 1408 Last data filed at 11/20/12 1300  Gross per 24 hour  Intake    480 ml  Output    602 ml  Net   -122 ml    Exam Awake Alert, Oriented X 3, No new F.N deficits, Normal affect .AT,PERRAL Supple Neck,No JVD, No cervical lymphadenopathy appriciated.  Symmetrical Chest wall movement, Good air movement bilaterally, CTAB RRR,No Gallops,Rubs or new Murmurs, No Parasternal Heave +ve B.Sounds, Abd Soft, Non tender, No organomegaly appriciated, No rebound - guarding or rigidity. No Cyanosis, Clubbing or edema, No new Rash or bruise   Left arm in sling   Data Review   Micro Results No results found for this or any previous visit (from the past 240 hour(s)).  Radiology Reports Dg Chest 2 View  11/19/2012   CLINICAL DATA:  Lost consciousness, left shoulder pain.  EXAM: CHEST  2 VIEW  COMPARISON:   08/01/2012  FINDINGS: The heart size is enlarged. There is bilateral interstitial thickening likely chronic. There is no focal consolidation or pneumothorax. There is a trace right pleural effusion versus scarring.  There is a comminuted fracture of the left surgical neck of the proximal humerus.  IMPRESSION: Comminuted fracture of the left surgical neck of the proximal humerus.  Cardiomegaly.   Electronically Signed   By: Elige Ko   On: 11/19/2012 10:21   Ct Head Wo Contrast  11/19/2012   CLINICAL DATA:  Syncopal episode.  Fall.  EXAM: CT HEAD WITHOUT CONTRAST  CT CERVICAL SPINE WITHOUT CONTRAST  TECHNIQUE: Multidetector CT imaging of the head and cervical spine was performed following the standard protocol without intravenous contrast. Multiplanar CT image reconstructions of the cervical spine were also generated.  COMPARISON:  05/20/2012 MR. 05/02/2011 CT head. 08/04/2005 CT head and cervical spine CT.  FINDINGS: CT HEAD FINDINGS  No skull fracture or intracranial hemorrhage.  Prominent small vessel disease type changes without CT evidence of large acute infarct.  No intracranial mass lesion noted on this unenhanced exam.  Mild atrophy without hydrocephalus.  Vascular calcifications  CT CERVICAL SPINE FINDINGS  Head tilt to the left with curvature of the cervical spine. Rotation of C1 upon C2. If there is a high clinical suspicion of ligamentous injury, flexion and extension views or MR can be performed for further delineation.  No cervical spine fracture noted.  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  Well corticated left C3-4 foraminal bony structures seen on prior exam and felt unlikely to be related to acute injury.  Lung apices are clear.  Prominent carotid bifurcation calcifications.  IMPRESSION: No skull fracture or intracranial hemorrhage.  No cervical spine fracture.  Rotation of C1 upon C2 with head tilt to left as noted above.    Electronically Signed   By: Bridgett Larsson M.D.   On: 11/19/2012 10:43   Ct Cervical Spine Wo Contrast  11/19/2012   CLINICAL DATA:  Syncopal episode.  Fall.  EXAM: CT HEAD WITHOUT CONTRAST  CT CERVICAL SPINE WITHOUT CONTRAST  TECHNIQUE: Multidetector CT imaging of the head and cervical spine was performed following the standard protocol without  intravenous contrast. Multiplanar CT image reconstructions of the cervical spine were also generated.  COMPARISON:  05/20/2012 MR. 05/02/2011 CT head. 08/04/2005 CT head and cervical spine CT.  FINDINGS: CT HEAD FINDINGS  No skull fracture or intracranial hemorrhage.  Prominent small vessel disease type changes without CT evidence of large acute infarct.  No intracranial mass lesion noted on this unenhanced exam.  Mild atrophy without hydrocephalus.  Vascular calcifications  CT CERVICAL SPINE FINDINGS  Head tilt to the left with curvature of the cervical spine. Rotation of C1 upon C2. If there is a high clinical suspicion of ligamentous injury, flexion and extension views or MR can be performed for further delineation.  No cervical spine fracture noted.  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  Well corticated left C3-4 foraminal bony structures seen on prior exam and felt unlikely to be related to acute injury.  Lung apices are clear.  Prominent carotid bifurcation calcifications.  IMPRESSION: No skull fracture or intracranial hemorrhage.  No cervical spine fracture.  Rotation of C1 upon C2 with head tilt to left as noted above.   Electronically Signed   By: Bridgett Larsson M.D.   On: 11/19/2012 10:43   Dg Shoulder Left  11/19/2012   CLINICAL DATA:  Left shoulder pain, loss of consciousness.  EXAM: LEFT SHOULDER - 2+ VIEW  COMPARISON:  None.  FINDINGS: There is a comminuted fracture of the surgical neck of the left proximal humerus which also involves the greater tuberosity. There is apex anterior angulation. There is  no glenohumeral dislocation. There are mild degenerative changes of the acromioclavicular joint.  IMPRESSION: Comminuted fracture of the surgical neck of the left proximal humerus.   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 This was found to be concordant by Dr. Edwin Cap. Pathology was relayed to the patient by telephone. The patient reported doing well after the biopsy with tenderness at the site. Post biopsy instructions were reviewed and her questions were answered. She was encouraged to call [facility] for any additional concerns. She was asked to return in 6 months for diagnostic right mammography.  Pathology results were reported by Sonnie Alamo RN, BSN on November 08, 2012.   Electronically Signed   By: Jerene Dilling M.D.   On: 11/08/2012 10:03   11/11/2012   CLINICAL DATA:  Right breast calcifications.  EXAM: STEREOTACTIC VACUUM ASSIST RIGHT  COMPARISON:  Previous exams.  FINDINGS: I met with the patient and we discussed the procedure of stereotactic-guided biopsy, including benefits and alternatives. We discussed the high likelihood of a successful procedure. We discussed the risks of the procedure, including infection, bleeding, tissue injury, clip migration, and inadequate sampling. Informed, written consent was given. The usual time out protocol was performed immediately prior to the procedure.  Using sterile technique and 2% Lidocaine as local anesthetic, under stereotactic guidance, a 9 gauge Suros device was used to perform core needle biopsy of calcifications at 5 o'clock in the right breast using a medial to lateral approach. Specimen radiograph was performed, showing calcification. Specimens with calcifications are  identified for pathology.  At the conclusion of the procedure, a dumbbell-shaped tissue marker clip was deployed into the biopsy cavity. Follow-up  2-view mammogram confirmed the clip to be in the appropriate position.  IMPRESSION: Stereotactic-guided biopsy of right breast calcifications. No apparent complications.  Electronically Signed: By: Jerene Dilling M.D. On: 11/07/2012 08:57    CBC  Recent Labs Lab 11/18/12 1608 11/19/12 0934 11/19/12 1544 11/20/12 0522  WBC 4.6 5.0 6.3 3.9*  HGB 9.3* 9.5* 9.2* 8.7*  HCT 27.5* 26.9* 26.4* 25.1*  PLT 308.0 260 295 276  MCV 90.0 86.8 87.7 87.2  MCH  --  30.6 30.6 30.2  MCHC 33.7 35.3 34.8 34.7  RDW 16.4* 15.8* 15.5 15.7*  LYMPHSABS 0.4* 0.6*  --   --   MONOABS 0.2 0.2  --   --   EOSABS 0.1 0.1  --   --   BASOSABS 0.0 0.0  --   --     Chemistries   Recent Labs Lab 11/18/12 1608 11/19/12 0934 11/19/12 1544 11/19/12 1954 11/20/12 0522  NA 126* 128*  --  130* 129*  K 3.5 2.8*  --  3.2* 2.9*  CL 83* 86*  --  88* 89*  CO2 31 29  --  34* 34*  GLUCOSE 98 108*  --  145* 90  BUN 23 24*  --  20 20  CREATININE 1.2 1.19* 1.07 1.00 0.97  CALCIUM 8.7 8.8  --  9.1 8.6  MG  --   --   --   --  1.4*  AST  --  21  --   --  16  ALT  --  14  --   --  11  ALKPHOS  --  99  --   --  87  BILITOT  --  0.5  --   --  0.5   ------------------------------------------------------------------------------------------------------------------ estimated creatinine clearance is 58 ml/min (by C-G formula based on Cr of 0.97). ------------------------------------------------------------------------------------------------------------------ No results found for this basename: HGBA1C,  in the last 72 hours ------------------------------------------------------------------------------------------------------------------ No results found for this basename: CHOL, HDL, LDLCALC, TRIG, CHOLHDL, LDLDIRECT,  in the last 72  hours ------------------------------------------------------------------------------------------------------------------ No results found for this basename: TSH, T4TOTAL, FREET3, T3FREE, THYROIDAB,  in the last 72 hours ------------------------------------------------------------------------------------------------------------------ No results found for this basename: VITAMINB12, FOLATE, FERRITIN, TIBC, IRON, RETICCTPCT,  in the last 72 hours  Coagulation profile No results found for this basename: INR, PROTIME,  in the last 168 hours  No results found for this basename: DDIMER,  in the last 72 hours  Cardiac Enzymes  Recent Labs Lab 11/19/12 1206 11/19/12 1954 11/20/12 0005  TROPONINI <0.30 <0.30 <0.30   ------------------------------------------------------------------------------------------------------------------ No components found with this basename: POCBNP,      Time Spent in minutes   40   Susa Raring K M.D on 11/20/2012 at 2:08 PM  Between 7am to 7pm - Pager - [phone]  After 7pm go to [email or web address] - password [email or web address]  And look for the night coverage person covering for me after hours  [facility] Office  [phone]

## 2012-11-20 NOTE — Consult Note (Signed)
Cardiology Consultation Note  Patient ID: [name], MRN: [id number], DOB/AGE: 01-04-1945 67 y.o. Admit date: 11/19/2012   Date of Consult: 11/20/2012 Primary Physician: [name], MD Primary Cardiologist: Hochrein. Seen by [name] with EP in 04/2012.  Chief Complaint: shoulder pain Reason for Consult: syncope -> shoulder fracture, preop eval  HPI: [name] is a 67 y/o F with history of CAD s/p MI/PCI to RCA 2002 (last cath 04/2012: for med rx), chronic systolic CHF, prior syncope in the setting of 2:1 heart block, moderate COPD, DM, iron-def anemia, early dementia but functional, and unintentional weight loss who presented to [facility] with a syncopal episode and shoulder fracture.   In 04/2012 she presented for evaluation of syncope (2 episodes). She came to our office to pick up an event monitor and had an episode of CP and weakness in the waiting room. She was found to have 2:1 AVB with HR 35. She was admitted to [facility] and evaluated by EP who felt she had 2:1 block and Wenckebach. She was on Coreg which was discontinued at that time. 2D echo showed EF 40-45% without RWMA although nuclear stress test was significantly abnormal demonstrating infarct, ischemia, and EF 14%. She went on to have cath showing patent stents in the RCA, and chronic diffuse disease in the distal LAD. There were 99% serial stenoses in a small caliber PDA. She was treated medically. She has remained off beta blockers. At that visit it was noted that she had unintentionally lost somewhere between 80-100 lbs over the last few years for unclear reasons. GI workup in the spring was unrevealing except for some gastritis (see PMH). Breast biopsy 11/2012 was negative for malignancy. This appears to have stabilized somewhat with weight generally in 140s-150s since spring. At her post-hospital appointment in 05/2012, she was no longer having symptoms thus event monitoring was not pursued. In the interim her PCP  has set her up with hospice services but it does not seem like she is receiving them in the traditional sense. Her family accepted the services so that she could be looked in on by home health. She wishes to be full code and is willing to proceed with any workup that is necessary, does not want to stop any treatments and does not want palliative care.  Yesterday she was sitting on the commode. When she stood up, she felt dizzy then passed out. She landed on her left shoulder. She does not think she had any CP, palpitations or SOB acutely associated with this episode. Her husband heard her fall and came to her side. They think she was out for just a few seconds. When she came to, she was still transiently dizzy but regained full awareness in less than a minute. No b/b incontinence. She noted L shoulder pain so came to the ER where she was found to have a communited fracture of the L prox humerus. She was also found to have severe hypomagnesemia 1.4, hypokalemia 2.9, and chronic hyponatremia 126-129. Lytes are being repleted by IM. She received IV fluids in the ER and lasix has been continued but metolazone is on hold. Orthostatics were positive on adm: Lying 133/64 -> sitting 141/73 -> standing 108/65, P 76->84. She is also anemic with Hgb down to 8.7 today but denies any known bleeding. H/o iron infusions remotely. Troponins have been negative. CT head nonacute. LE venous duplex neg. There is plan for ORIF pending clearance from medical standpoint.  She is not very active  at home. She reports occasional chest pain/SOB without any particular pattern except when she doesn't take her Imdur on time. She has chronic orthopnea. She does report episodes of sporadic dizziness once every few weeks. She'll feel dizzy/swimmy-headed for several minutes and it will resolve, typically on its own or when she gets up and moves around. Telemetry and EKG confirm NSR with a long first degree AVB and blocked PAC's, but no episodes  of bradycardia thus far.  Past Medical History  Diagnosis Date  . DM   . DYSLIPIDEMIA   . OBESITY NOS   . DEPRESSION, RECURRENT, IN PARTIAL REMISSION   . HYPERTENSION   . CORONARY ARTERY DISEASE     a. s/p Q wave MI 2002 with RCA stent. b. Abnl nuc 04/2012 with subsequent cath showing patent stents in RCA, chronic diffuse disease in dLAD, 20% LM, EF 20%, no focal targets for PCI.  [name] Chronic systolic CHF (congestive heart failure)     a. Last EF 20% by cath 04/29/2012, 40-45% by echo 04/24/12.  [name] GERD   . DIVERTICULOSIS, COLON, WITH HEMORRHAGE   . BACK PAIN, LUMBAR, WITH RADICULOPATHY   . OBSTRUCTIVE SLEEP APNEA   . Anemia, iron deficiency   . Hyponatremia   . Tobacco abuse   . Moderate COPD (chronic obstructive pulmonary disease) 11/07/2005    Spirometry 03/09/11>>FEV1 2.02 (88%), FEV1% 76    . Anxiety   . Arthritis   . Myocardial infarction 2001  . Dementia     a. Saw neuro 08/2012: concerning for early dementia, labs unremarkable except elev CRP.  [name] Syncope     a. 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 BB discontinued. b. Recurrence in 11/2012.  [name] Chronic edema       Most Recent Cardiac Studies: 2D Echo 04/24/12 - Left ventricle: The cavity size was mildly dilated. Wall thickness was increased in a pattern of mild LVH. Systolic function was mildly to moderately reduced. The estimated ejection fraction was in the range of 40% to 45%. Wall motion was normal; there were no regional wall motion abnormalities. - Left atrium: The atrium was mildly dilated. - Right atrium: The atrium was mildly to moderately dilated. - Pulmonary arteries: Systolic pressure was moderately increased.  Cardiac Cath 04/29/12 Procedure Performed:  1. Left Heart Catheterization 2. Selective Coronary Angiography 3. Left ventricular angiogram Operator: Verne Carrow, MD  Arterial access site: Right radial artery.  Indication: 67 yo female with history of CAD, s/p MI in 2002  treated with RCA stent, DM2, HTN, HL, CHF, sleep apnea, GERD, asthma, tobacco abuse, iron deficiency anemia admitted with syncope, chest pain and found to have second degree AV block type 1. She has been seen by EP and beta blocker has been held. No plans for pacemaker. Last LHC 05/02/11: distal LAD 99% unchanged from 2010 (too small for PCI), mid and distal RCA stents patent, pRCA 30% before stent, EF 35%.  Procedure Details:  The risks, benefits, complications, treatment options, and expected outcomes were discussed with the patient. The patient and/or family concurred with the proposed plan, giving informed consent. The patient was brought to the cath lab after IV hydration was begun and oral premedication was given. The patient was further sedated with Versed. The right wrist was assessed with an Allens test which was positive. The right wrist was prepped and draped in a sterile fashion. 1% lidocaine was used for local anesthesia. Using the modified Seldinger access technique, a 5 French sheath was placed in  the right radial artery. 3 mg Verapamil was given through the sheath. 4000 units IV heparin was given. Standard diagnostic catheters were used to perform selective coronary angiography. A pigtail catheter was used to perform a left ventricular angiogram. The sheath was removed from the right radial artery and a Terumo hemostasis band was applied at the arteriotomy site on the right wrist.  There were no immediate complications. The patient was taken to the recovery area in stable condition.  Hemodynamic Findings:  Central aortic pressure: 153/78  Left ventricular pressure: 151/11/11  Angiographic Findings:  Left main: 20% plaque.  Left Anterior Descending Artery: Large caliber vessel that courses to the apex. The proximal and mid vessel has mild plaque disease. The distal vessel becomes very small in caliber and has diffuse disease. There appears to be a focal 99% stenosis but this is in the very  distal, smaller segment as it approaches the apex. This is too small for PCI. Unchanged from cath in 2010 and 2013.  Circumflex Artery: Moderate sized vessel with mild plaque in mid vessel.  Right Coronary Artery: Large dominant vessel with patent stents in the mid and distal vessel. There is a 30% stenosis in the proximal vessel just before the stent. The mid stent is patent with mild restenosis. The mid vessel has a focal 40-50% stenosis. The distal stent is patent with mild in stent restenosis. The PL branch and PDA are small to moderate sized, long branches. The PDA becomes very small caliber and has serial 99% stenoses in this very small caliber segment of the vessel. (1.0 mm). The posterolateral branches have mild plaque disease.  Left Ventricular Angiogram: LVEF=20% with global hypokinesis.  Impression:  1. 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  2. Moderate to severe global LV systolic dysfunction.  Recommendations: Continue medical management of CAD. No focal targets for PCI.  Complications: None. The patient tolerated the procedure well.   Nuc 04/26/12 IMPRESSION: 1. Large infarct involves the inferior and inferior lateral wall. 2. Evidence of peri infarct ischemia involving the apical segments of the anterior lateral wall, anterior wall and septum. 3. Markedly diminished. Left ventricular systolic function. The left ventricular ejection fraction equals 14%.     Surgical History:  Past Surgical History  Procedure Laterality Date  . Uvuloplasty    . Total abdominal hysterectomy w/ bilateral salpingoophorectomy    . Abdominal hysterectomy    . Cholecystectomy  2004  . Coronary angioplasty with stent placement  2002    last 04-29-12  . Esophagogastroduodenoscopy (egd) with propofol N/A 06/06/2012    Procedure: ESOPHAGOGASTRODUODENOSCOPY (EGD) WITH PROPOFOL;  Surgeon: Rachael Fee, MD;  Location: [facility] ENDOSCOPY;  Service:  Endoscopy;  Laterality: N/A;     Home Meds: Prior to Admission medications   Medication Sig Start Date End Date Taking? Authorizing Provider  acetaminophen (TYLENOL) 500 MG tablet Take 500 mg by mouth every 6 (six) hours as needed for pain.   Yes Historical Provider, MD  albuterol (PROVENTIL) (2.5 MG/3ML) 0.083% nebulizer solution Take 2.5 mg by nebulization every 6 (six) hours as needed for wheezing.   Yes Historical Provider, MD  aspirin 81 MG tablet Take 81 mg by mouth daily.     Yes Historical Provider, MD  atorvastatin (LIPITOR) 40 MG tablet Take 40 mg by mouth every morning.   Yes Historical Provider, MD  diclofenac sodium (VOLTAREN) 1 % GEL Apply 2 g topically 4 (four) times daily. 06/12/12  Yes Su Monks, PA-C  feeding supplement (GLUCERNA SHAKE) LIQD Take 237 mLs by mouth as needed.    Yes Historical Provider, MD  furosemide (LASIX) 80 MG tablet Take 160 mg by mouth 2 (two) times daily.   Yes Historical Provider, MD  gabapentin (NEURONTIN) 300 MG capsule Take 1 capsule (300 mg total) by mouth 2 (two) times daily. 06/12/12  Yes Clydie Braun Prueter, PA-C  hydrALAZINE (APRESOLINE) 50 MG tablet Take 50 mg by mouth every 8 (eight) hours.   Yes Historical Provider, MD  hydrOXYzine (ATARAX/VISTARIL) 10 MG tablet Take 10 mg by mouth 2 (two) times daily.   Yes Historical Provider, MD  Iron-Folic Acid-B12-C-Docusate (FERRAPLUS 90) 90-1 MG TABS Take 1 tablet by mouth daily. 10/30/12  Yes [name], MD  isosorbide mononitrate (IMDUR) 60 MG 24 hr tablet Take 60 mg by mouth daily at 12 noon.   Yes Historical Provider, MD  Lancet Devices (SIMPLE DIAGNOSTICS LANCING DEV) MISC  10/01/12  Yes Historical Provider, MD  losartan (COZAAR) 50 MG tablet Take 1 tablet (50 mg total) by mouth daily. 10/30/12  Yes [name], MD  metolazone (ZAROXOLYN) 2.5 MG tablet Take 1 tablet (2.5 mg total) by mouth 3 (three) times a week. 09/04/12  Yes Dyann Kief, PA-C  nitroGLYCERIN (NITROSTAT) 0.4 MG SL tablet Place  1 tablet (0.4 mg total) under the tongue every 5 (five) minutes x 3 doses as needed for chest pain. 04/30/12  Yes Rhonda G Barrett, PA-C  omeprazole (PRILOSEC) 40 MG capsule Take 40 mg by mouth daily.   Yes Historical Provider, MD  polyethylene glycol (MIRALAX / GLYCOLAX) packet Take 17 g by mouth daily as needed (for constipation).   Yes Historical Provider, MD  potassium chloride (K-DUR,KLOR-CON) 10 MEQ tablet Take 10-20 mEq by mouth See admin instructions. Take 3 tablets twice daily on days that Metolazone is taken. Take 2 tablets twice daily on other days   Yes Historical Provider, MD  sitaGLIPtan-metformin (JANUMET) 50-500 MG per tablet Take 1 tablet by mouth 2 (two) times daily with a meal.   Yes Historical Provider, MD  spironolactone (ALDACTONE) 25 MG tablet Take 1 tablet (25 mg total) by mouth daily. 11/18/12  Yes Rosalio Macadamia, NP  tiotropium (SPIRIVA) 18 MCG inhalation capsule Place 18 mcg into inhaler and inhale daily.   Yes Historical Provider, MD  traMADol (ULTRAM) 50 MG tablet Take 50-100 mg by mouth every 6 (six) hours as needed for moderate pain.   Yes Historical Provider, MD  traZODone (DESYREL) 100 MG tablet Take 150 mg by mouth at bedtime.   Yes Historical Provider, MD  venlafaxine XR (EFFEXOR-XR) 150 MG 24 hr capsule Take 150 mg by mouth every morning.   Yes Historical Provider, MD    Inpatient Medications:  . aspirin EC  81 mg Oral Daily  . atorvastatin  40 mg Oral q1800  . diclofenac sodium  2 g Topical QID  . ferrous fumarate-b12-vitamic C-folic acid  1 capsule Oral QPC breakfast  . furosemide  160 mg Oral BID  . gabapentin  300 mg Oral BID  . heparin  5,000 Units Subcutaneous Q8H  . isosorbide mononitrate  60 mg Oral Q1200  . magnesium sulfate 1 - 4 g bolus IVPB  2 g Intravenous Once  . pantoprazole  40 mg Oral Daily  . potassium chloride  40 mEq Oral Q6H  . sodium chloride  3 mL Intravenous Q12H  . sodium chloride  3 mL Intravenous Q12H  . tiotropium  18 mcg  Inhalation Daily  . traZODone  150 mg Oral QHS      Allergies:  Allergies  Allergen Reactions  . Amlodipine Other (See Comments)    constipation  . Ramipril Cough  . Adhesive [Tape] Hives    "peels skin off"  . Morphine And Related Other (See Comments)    Per Husband, Pt has hallucinations  . Penicillins Itching  . Codeine Rash    History   Social History  . Marital Status: Married    Spouse Name: N/A    Number of Children: 1  . Years of Education: 12   Occupational History  . Retired Insurance account manager for [employer]    Social History Main Topics  . Smoking status: Former Smoker -- 0.25 packs/day for 20 years    Types: Cigarettes    Quit date: 07/17/2012  . Smokeless tobacco: Never Used     Comment: Still smokes a little.  . Alcohol Use: No     Comment: Patient drinks cafinated daily.  . Drug Use: No  . Sexual Activity: Not Currently    Birth Control/ Protection: Surgical   Other Topics Concern  . Not on file   Social History Narrative  . No narrative on file     Family History  Problem Relation Age of Onset  . Stroke Mother   . Breast cancer Sister   . Emphysema Sister   . Cancer Sister     breast  . Alcohol abuse Brother   . Colon cancer      uncles x 2  . Prostate cancer Brother      Review of Systems: General: negative for chills, fever Cardiovascular: see above Dermatological: negative for rash Respiratory: negative for cough or wheezing Urologic: negative for hematuria Abdominal: negative for nausea, vomiting, diarrhea, bright red blood per rectum, melena, or hematemesis Neurologic: see above All other systems reviewed and are otherwise negative except as noted above.  Labs:  Recent Labs  11/19/12 1206 11/19/12 1954 11/20/12 0005  TROPONINI <0.30 <0.30 <0.30   Lab Results  Component Value Date   WBC 3.9* 11/20/2012   HGB 8.7* 11/20/2012   HCT 25.1* 11/20/2012   MCV 87.2 11/20/2012   PLT 276 11/20/2012    Recent  Labs Lab 11/20/12 0522  NA 129*  K 2.9*  CL 89*  CO2 34*  BUN 20  CREATININE 0.97  CALCIUM 8.6  PROT 5.5*  BILITOT 0.5  ALKPHOS 87  ALT 11  AST 16  GLUCOSE 90   Lab Results  Component Value Date   CHOL 125 08/22/2012   HDL 50.30 08/22/2012   LDLCALC 62 08/22/2012   TRIG 65.0 08/22/2012   Radiology/Studies:  Dg Chest 2 View  11/19/2012   CLINICAL DATA:  Lost consciousness, left shoulder pain.  EXAM: CHEST  2 VIEW  COMPARISON:  08/01/2012  FINDINGS: The heart size is enlarged. There is bilateral interstitial thickening likely chronic. There is no focal consolidation or pneumothorax. There is a trace right pleural effusion versus scarring.  There is a comminuted fracture of the left surgical neck of the proximal humerus.  IMPRESSION: Comminuted fracture of the left surgical neck of the proximal humerus.  Cardiomegaly.   Electronically Signed   By: Elige Ko   On: 11/19/2012 10:21   Ct Head Wo Contrast  11/19/2012   CLINICAL DATA:  Syncopal episode.  Fall.  EXAM: CT HEAD WITHOUT CONTRAST  CT CERVICAL SPINE WITHOUT CONTRAST  TECHNIQUE: Multidetector CT imaging of  the head and cervical spine was performed following the standard protocol without intravenous contrast. Multiplanar CT image reconstructions of the cervical spine were also generated.  COMPARISON:  05/20/2012 MR. 05/02/2011 CT head. 08/04/2005 CT head and cervical spine CT.  FINDINGS: CT HEAD FINDINGS  No skull fracture or intracranial hemorrhage.  Prominent small vessel disease type changes without CT evidence of large acute infarct.  No intracranial mass lesion noted on this unenhanced exam.  Mild atrophy without hydrocephalus.  Vascular calcifications  CT CERVICAL SPINE FINDINGS  Head tilt to the left with curvature of the cervical spine. Rotation of C1 upon C2. If there is a high clinical suspicion of ligamentous injury, flexion and extension views or MR can be performed for further delineation.  No cervical spine fracture  noted.  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  Well corticated left C3-4 foraminal bony structures seen on prior exam and felt unlikely to be related to acute injury.  Lung apices are clear.  Prominent carotid bifurcation calcifications.  IMPRESSION: No skull fracture or intracranial hemorrhage.  No cervical spine fracture.  Rotation of C1 upon C2 with head tilt to left as noted above.   Electronically Signed   By: Bridgett Larsson M.D.   On: 11/19/2012 10:43   Ct Cervical Spine Wo Contrast  11/19/2012   CLINICAL DATA:  Syncopal episode.  Fall.  EXAM: CT HEAD WITHOUT CONTRAST  CT CERVICAL SPINE WITHOUT CONTRAST  TECHNIQUE: Multidetector CT imaging of the head and cervical spine was performed following the standard protocol without intravenous contrast. Multiplanar CT image reconstructions of the cervical spine were also generated.  COMPARISON:  05/20/2012 MR. 05/02/2011 CT head. 08/04/2005 CT head and cervical spine CT.  FINDINGS: CT HEAD FINDINGS  No skull fracture or intracranial hemorrhage.  Prominent small vessel disease type changes without CT evidence of large acute infarct.  No intracranial mass lesion noted on this unenhanced exam.  Mild atrophy without hydrocephalus.  Vascular calcifications  CT CERVICAL SPINE FINDINGS  Head tilt to the left with curvature of the cervical spine. Rotation of C1 upon C2. If there is a high clinical suspicion of ligamentous injury, flexion and extension views or MR can be performed for further delineation.  No cervical spine fracture noted.  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  Well corticated left C3-4 foraminal bony structures seen on prior exam and felt unlikely to be related to acute injury.  Lung apices are clear.  Prominent carotid bifurcation calcifications.  IMPRESSION: No skull fracture or intracranial hemorrhage.  No cervical spine fracture.  Rotation of  C1 upon C2 with head tilt to left as noted above.   Electronically Signed   By: Bridgett Larsson M.D.   On: 11/19/2012 10:43   Dg Shoulder Left  11/19/2012   CLINICAL DATA:  Left shoulder pain, loss of consciousness.  EXAM: LEFT SHOULDER - 2+ VIEW  COMPARISON:  None.  FINDINGS: There is a comminuted fracture of the surgical neck of the left proximal humerus which also involves the greater tuberosity. There is apex anterior angulation. There is no glenohumeral dislocation. There are mild degenerative changes of the acromioclavicular joint.  IMPRESSION: Comminuted fracture of the surgical neck of the left proximal humerus.   Electronically Signed   By: Elige Ko   On: 11/19/2012 10:23   EKG: NSR with a long first degree AVB and blocked PAC's. LVH with secondary repol abnormality. Old infarct.. Reading as  prolonged QT but T is difficult to see.  Physical Exam: Blood pressure 133/67, pulse 80, temperature 98.7 F (37.1 C), temperature source Oral, resp. rate 18, height 5\' 9"  (1.753 m), weight 150 lb 5.7 oz (68.2 kg), SpO2 97.00%. General: Well developed, well nourished elderly AAF, in no acute distress. Head: Normocephalic, atraumatic, sclera non-icteric, no xanthomas, nares are without discharge.  Neck: Negative for carotid bruits. JVD not elevated. Lungs: Clear bilaterally to auscultation without wheezes, rales, or rhonchi. Breathing is unlabored. Heart: RRR with S1 S2. +S3 gallop. Soft SEM RUSB. No rubs appreciated. Abdomen: Soft, non-tender, non-distended with normoactive bowel sounds. No hepatomegaly. No rebound/guarding. No obvious abdominal masses. Msk:  Strength and tone appear normal for age. Extremities: No clubbing or cyanosis. No edema.  Distal pedal pulses are 2+ and equal bilaterally. Neuro: Alert and oriented X 3. No facial asymmetry. No focal deficit. Moves all extremities spontaneously. Psych:  Responds to questions appropriately with a normal affect.   Assessment and Plan:   1.  Syncope (with prior history of such in 04/2012) - given history of recurrent syncope in the setting of known cardiomyopathy and prior bradyarrhythmias, EP has agreed to see the patient today. She may require EP study +/- device implantation (either ICD or Linq loop recorder), partial decision TBD based on LV function. Will also order carotid dopplers. 2. L shoulder fracture - see below regarding comments on pre-op clearance from Dr. Patty Sermons. 3. Chronic systolic CHF with cardiomyopathy EF variable by last studies all in 04/2012: 14% by nuc, 20% by cath, 40-45% by echo - agree with holding metolazone for now in setting of lyte disturbance. Cont Lasix. Repeat echocardiogram. Will resume Cozaar and 1/2 dose spironolactone and follow BP. 4. Orthostatic hypotension - although there was orthostasis present on admission there is still concern for arrhythmias given her structural cardiac disease. Scaling back meds. 5. CAD s/p MI/RCA PCI 2002 - cont ASA, statin. Not on BB due to history of bradyarrhythmias. No evidence for ischemia this adm. 6. Iron deficiency anemia of unclear etiology - per IM.  7. Chronic hyponatremia, severe hypomagnesemia, hypokalemia - lytes being repleted by IM. 8. Early dementia - stable. Completely A+O today. 9. Unintentional weight loss (80-100lbs), but has stabilized - she has had a fairly extensive GI workup and recent normal breast biopsy. I don't see that she's recently had her chest evaluated. I think we should consider CT chest to rule out malignancy in the setting of her chronic hyponatremia given possibility of paraneoplastic SIADH. Will defer to IM for timing of study.  Signed, Ronie Spies PA-C 11/20/2012, 10:30 AM Patient seen on [facility] with Ronie Spies, PA-C. EKGs and rhythm strips reviewed. She has evidence of first degree heart block and some periods of Mobitz I second degree heart block (Wenckebach). It is not clear at this time whether her syncopal episode was related to her  heart block or to orthostatic hypotension secondary to diuresis and electrolyte abnormalities. She has evidence by history of previous  systolic LV dysfunction with prior EF by echo in April 2004 of 40-45%. An updated echo is pending. Estimates of LV function last April widely variable depending on modality as noted above. She has known CAD but has not been having any recent chest pain or symptoms of coronary ischemia. Her physical exam reveals clear lungs and she is lying flat without any dyspnea. She has a prominent S3 gallop consistent with her history of LV systolic dysfunction and old myocardial scar. Agree with assessment and  plan as noted above. With her history of variable AV block off BB will also check ACE level.  From a pre-operative standpoint she is a moderate but not prohibitive risk for shoulder surgery which can be scheduled soon. She is cleared for surgery from cardiac standpoint once electrolyte status has been repleted.

## 2012-11-20 NOTE — Clinical Documentation Improvement (Signed)
Possible Clinical Conditions?  Acute on Chronic Respiratory Failure Chronic Respiratory Failure Other Condition Cannot Clinically Determine    Risk Factors: Patient with history of COPD, Home O2 @ 2L, noted per 11/18 H&P.   Thank You, Marciano Sequin, Clinical Documentation Specialist:  [phone]  [facility]- Health Information Management

## 2012-11-20 NOTE — Progress Notes (Signed)
[name] husband and brother present.  According to pt and family, pt is going to have surgery maybe tomorrow.  Pt is a full code and has been adamant that she wants anything done that will prolong her life.  Pt does not consider herself to have a terminal diagnosis.  Husband voiced frustration that when he says that she is a hospice pt that they think she is "terminal."   Pt has never been on the hospice page of comfort care only.  Will continue to follow pt while she is hospitalized.  Wynonia Hazard, LCSW

## 2012-11-20 NOTE — Progress Notes (Signed)
Patient is scheduled for PO Lasix 160 mg twice a day with no Potassium ordered daily; Potassium 2.9 this am PO potassium ordered for 3 doses only at 40 meq per dose; MD notified; MD ordered 40 meq potassium daily starting for tomorrow.  [name], Geroge Baseman

## 2012-11-21 ENCOUNTER — Inpatient Hospital Stay (HOSPITAL_COMMUNITY)

## 2012-11-21 DIAGNOSIS — I059 Rheumatic mitral valve disease, unspecified: Secondary | ICD-10-CM

## 2012-11-21 LAB — BASIC METABOLIC PANEL
BUN: 18 mg/dL (ref 6–23)
CO2: 33 mEq/L — ABNORMAL HIGH (ref 19–32)
Calcium: 9.1 mg/dL (ref 8.4–10.5)
Creatinine, Ser: 1.1 mg/dL (ref 0.50–1.10)
Glucose, Bld: 138 mg/dL — ABNORMAL HIGH (ref 70–99)
Sodium: 131 mEq/L — ABNORMAL LOW (ref 135–145)

## 2012-11-21 LAB — GLUCOSE, CAPILLARY
Glucose-Capillary: 132 mg/dL — ABNORMAL HIGH (ref 70–99)
Glucose-Capillary: 137 mg/dL — ABNORMAL HIGH (ref 70–99)
Glucose-Capillary: 110 mg/dL — ABNORMAL HIGH (ref 70–99)

## 2012-11-21 LAB — MAGNESIUM: Magnesium: 1.4 mg/dL — ABNORMAL LOW (ref 1.5–2.5)

## 2012-11-21 MED ORDER — MAGNESIUM OXIDE 400 (241.3 MG) MG PO TABS
800.0000 mg | ORAL_TABLET | Freq: Every day | ORAL | Status: AC
  Administered 2012-11-21 – 2012-11-22 (×2): 800 mg via ORAL
  Filled 2012-11-21 (×2): qty 2

## 2012-11-21 MED ORDER — MAGNESIUM SULFATE 40 MG/ML IJ SOLN
2.0000 g | Freq: Once | INTRAMUSCULAR | Status: AC
  Administered 2012-11-21: 2 g via INTRAVENOUS
  Filled 2012-11-21: qty 50

## 2012-11-21 MED ORDER — MORPHINE SULFATE 2 MG/ML IJ SOLN: 2.0000 mg | Freq: Four times a day (QID) | INTRAMUSCULAR | Status: DC | PRN

## 2012-11-21 MED ORDER — SODIUM CHLORIDE 0.9 % IV SOLN: 250.0000 mL | INTRAVENOUS | Status: DC | PRN

## 2012-11-21 NOTE — Progress Notes (Signed)
Room 2W 26 - [name] - [facility] RN Visit-R.Taijon Vink RN  Related admission to [facility] diagnosis of CAD.  Pt is FULL code.    Pt alert & oriented, lying upright in bed, with complaints of pain at 7/10 - staff RN present and will give pain meds - due @ 12 noon.  Husband present.    Patient's home medication list is on shadow chart.     [name] sling was replaced with larger one to be able to hold onto her hand/wrist - has finger loop for thumb to ensure position.  Per pt - she may be discharged tomorrow.  Husband states they need scales to weigh pt - states they have no money until husband gets pain again. Discussed with DME specialist Jewel - this is not in the [facility] packages.  Talked with [facility] rep who relayed [employer] sometimes has scales for sale @ $2-3.  Relayed this info to husband who states he knows the location of the [place] on [address] and will check to see if they have scales.    Please call [facility] @ [phone]- ask for RN Liaison or after hours,ask for on-call RN with any hospice needs.   Thank you.  Joneen Boers, RN  [facility]  Hospice Liaison  [phone])

## 2012-11-21 NOTE — Progress Notes (Signed)
SUBJECTIVE:  Shoulder pain but no acute SOB or chest pain   PHYSICAL EXAM Filed Vitals:   11/20/12 0758 11/20/12 1336 11/20/12 1924 11/21/12 0440  BP:  127/70 120/71 149/85  Pulse:  80 89 94  Temp:  98.5 F (36.9 C) 98.4 F (36.9 C) 98.3 F (36.8 C)  TempSrc:  Oral Oral Oral  Resp:  18 16 16   Height:      Weight:    149 lb 6.4 oz (67.767 kg)  SpO2: 97% 97% 93% 100%   General:  No acute distress Lungs:  Clear Heart:  RRR Abdomen:  Positive bowel sounds, no rebound no guarding Extremities:  No edema  LABS: Lab Results  Component Value Date   TROPONINI <0.30 11/20/2012   Results for orders placed during the hospital encounter of 11/19/12 (from the past 24 hour(s))  GLUCOSE, CAPILLARY     Status: Abnormal   Collection Time    11/20/12 11:10 AM      Result Value Range   Glucose-Capillary 120 (*) 70 - 99 mg/dL  TSH     Status: None   Collection Time    11/20/12  1:08 PM      Result Value Range   TSH 2.427  0.350 - 4.500 uIU/mL  T4, FREE     Status: None   Collection Time    11/20/12  1:08 PM      Result Value Range   Free T4 1.21  0.80 - 1.80 ng/dL  ANGIOTENSIN CONVERTING ENZYME     Status: None   Collection Time    11/20/12  1:08 PM      Result Value Range   Angiotensin-Converting Enzyme 40  8 - 52 U/L  GLUCOSE, CAPILLARY     Status: Abnormal   Collection Time    11/20/12  4:09 PM      Result Value Range   Glucose-Capillary 134 (*) 70 - 99 mg/dL  GLUCOSE, CAPILLARY     Status: Abnormal   Collection Time    11/20/12  9:23 PM      Result Value Range   Glucose-Capillary 127 (*) 70 - 99 mg/dL  BASIC METABOLIC PANEL     Status: Abnormal   Collection Time    11/21/12  5:07 AM      Result Value Range   Sodium 131 (*) 135 - 145 mEq/L   Potassium 3.8  3.5 - 5.1 mEq/L   Chloride 88 (*) 96 - 112 mEq/L   CO2 33 (*) 19 - 32 mEq/L   Glucose, Bld 138 (*) 70 - 99 mg/dL   BUN 18  6 - 23 mg/dL   Creatinine, Ser 1.61  0.50 - 1.10 mg/dL   Calcium 9.1  8.4 - 09.6  mg/dL   GFR calc non Af Amer 50 (*) >90 mL/min   GFR calc Af Amer 58 (*) >90 mL/min  MAGNESIUM     Status: Abnormal   Collection Time    11/21/12  5:07 AM      Result Value Range   Magnesium 1.4 (*) 1.5 - 2.5 mg/dL  GLUCOSE, CAPILLARY     Status: Abnormal   Collection Time    11/21/12  6:22 AM      Result Value Range   Glucose-Capillary 132 (*) 70 - 99 mg/dL    Intake/Output Summary (Last 24 hours) at 11/21/12 0950 Last data filed at 11/21/12 0944  Gross per 24 hour  Intake    480 ml  Output  0 ml  Net    480 ml    ASSESSMENT AND PLAN:  Syncope:  Difficult to know whether this was related to orthostasis/vagal vs a dysrhythmia associated with reduced EF.  She also has Class III CHF symptoms and we need to consider appropriate therapy which could include a CRT or CRT/D.  This evaluation should not interfere with any necessary shoulder surgery however.  Left humerus fracture:  She is being considered for surgical repair as early as tomorrow.  Discussed with Dr. Thedore Mins.  CHF:  Chronic systolic and diastolic.  She seems to be euvolemic at present.     [name] [facility] 11/21/2012 9:50 AM

## 2012-11-21 NOTE — Progress Notes (Signed)
[facility])  Hospice Homecare Chaplain  Patient (pt): [name]  Room: 1O10  Hospice homecare chaplain visited to assess for spiritual needs.  [name] husband indicated pt was getting a CT scan, so chaplain visited with pcg in the room.  Pcg debriefed around stressors and concerns and indicated they were looking forward to making a plan around pt having surgery and then addressing where they go from here.  Pcg indicated they are both leaning into their faith for coping.  Chaplain utilized active listening, normalizing feelings and affirming faith.  Chaplain provided pastoral presence, counsel and assurance of prayer.  Chaplain will continue to provide ongoing spiritual support and will follow.  Beverly Isley-Landreth ThM, BCCC [facility] Clinical Chaplain

## 2012-11-21 NOTE — Progress Notes (Signed)
Orthopedics Progress Note  Subjective: My shoulder hurts.  Objective:  Filed Vitals:   11/21/12 0955  BP: 126/71  Pulse: 84  Temp:   Resp:     General: Awake and alert  Musculoskeletal: Left shoulder swollen and tender.  Over all alignment looks ok.  Min edema distally.   Neurovascularly intact  Lab Results  Component Value Date   WBC 3.9* 11/20/2012   HGB 8.7* 11/20/2012   HCT 25.1* 11/20/2012   MCV 87.2 11/20/2012   PLT 276 11/20/2012       Component Value Date/Time   NA 131* 11/21/2012 0507   NA 138 04/16/2012 0936   K 3.8 11/21/2012 0507   K 4.0 04/16/2012 0936   CL 88* 11/21/2012 0507   CL 98 04/16/2012 0936   CO2 33* 11/21/2012 0507   CO2 30* 04/16/2012 0936   GLUCOSE 138* 11/21/2012 0507   GLUCOSE 100* 04/16/2012 0936   BUN 18 11/21/2012 0507   BUN 9.8 04/16/2012 0936   CREATININE 1.10 11/21/2012 0507   CREATININE 1.1 04/16/2012 0936   CALCIUM 9.1 11/21/2012 0507   CALCIUM 8.9 04/16/2012 0936   GFRNONAA 50* 11/21/2012 0507   GFRAA 58* 11/21/2012 0507    Lab Results  Component Value Date   INR 1.00 04/28/2012   INR 1.34 12/05/2011   INR 1.10 05/02/2011    Assessment/Plan:  s/p syncopal episode with fall and left shoulder injury.  Patient admitted for medical workup and treatment. Dr  Shelle Iron consulted initially.  Ortho was unaware of admission and thought patient was following up outpatient.  We were contacted this morning regarding disposition and recommended a CT scan given the patient was admitted to more precisely evaluate the relative position of the major fracture fragments at the shoulder to help determine the best treatment course.  Fortunately the CT shows an impacted proximal humerus fracture with acceptable alignment for non-surgical care.  I have discussed this with the patient and her husband who was at the bedside.  We will continue sling treatment with the arm across the waist, propped with a pillow behind the elbow in the bed and chair.  No PT at  this time for the shoulder.  Will start PT in about 4 weeks.  I will get her a larger sling as she has a long arm and hers is a little short..  Will follow her while she is in house and then see in one week in our office  [facility] [phone].  We apologize for the confusion.  Almedia Balls. Ranell Patrick, MD 11/21/2012 12:30 PM

## 2012-11-21 NOTE — Progress Notes (Signed)
[facility]                                                                                Patient Demographics  [name], is a 68 y.o. female, DOB - Jul 25, 1945, ZOX:[id number]  Admit date - 11/19/2012   Admitting Physician Alba Cory, MD  Outpatient Primary MD for the patient is [name], MD  LOS - 2   Chief Complaint  Patient presents with  . Loss of Consciousness  . Fall        Assessment & Plan    1-Syncope: She and due to underlying Wenkebach.heart block, cardiology monitoring the patient, CRT vs CRTD per cardiology.     2-Hypokalemia and Low Mag - replaced and monitored.      3-Systolic Heart Failure: Compensated. resume lasix. Monitor renal function. Cardiology following, repeat echo gram ordered.    4-Anemia: hb at baseline.     5-Left humerus fracture: Ortho consulted repeat CT scan noted, discussed the case with orthopedic again over the phone likely surgery tomorrow.    6-Encephalopathy: Secondary to narcotics, resolved.     7-Hyponatremia: Chronic, sodium at 129 on 09-17-2012. She received fluids in the ED. Will resume lasix.      Code Status: Full  Family Communication: Husband  Disposition Plan: Home   Procedures echo gram   Consults  orthopedics Dr. Jillyn Hidden and Dr. Ranell Patrick, Dr. Patty Sermons cardiologist, EP consult also made   Medications  Scheduled Meds: . aspirin EC  81 mg Oral Daily  . atorvastatin  40 mg Oral q1800  . diclofenac sodium  2 g Topical QID  . ferrous fumarate-b12-vitamic C-folic acid  1 capsule Oral QPC breakfast  . furosemide  160 mg Oral BID  . gabapentin  300 mg Oral BID  . heparin  5,000 Units Subcutaneous Q8H  . isosorbide mononitrate  60 mg Oral Q1200  . losartan  50 mg Oral Daily  . magnesium sulfate 1 - 4 g bolus IVPB  2 g Intravenous Once  . pantoprazole  40 mg Oral Daily  . potassium chloride  40 mEq Oral Daily  . sodium chloride  3 mL Intravenous Q12H  . sodium chloride  3 mL  Intravenous Q12H  . spironolactone  12.5 mg Oral Daily  . tiotropium  18 mcg Inhalation Daily  . traZODone  150 mg Oral QHS   Continuous Infusions:  PRN Meds:.acetaminophen, albuterol, HYDROcodone-acetaminophen, nitroGLYCERIN, ondansetron (ZOFRAN) IV, ondansetron, polyethylene glycol, sodium chloride, traMADol  DVT Prophylaxis   Heparin   Lab Results  Component Value Date   PLT 276 11/20/2012    Antibiotics     Anti-infectives   None          Subjective:   [name] today has, No headache, No chest pain, No abdominal pain - No Nausea, No new weakness tingling or numbness, No Cough - SOB. The left arm pain  Objective:   Filed Vitals:   11/20/12 1336 11/20/12 1924 11/21/12 0440 11/21/12 0955  BP: 127/70 120/71 149/85 126/71  Pulse: 80 89 94 84  Temp: 98.5 F (36.9 C) 98.4 F (36.9 C) 98.3 F (36.8 C)   TempSrc: Oral Oral Oral   Resp:  18 16 16    Height:      Weight:   67.767 kg (149 lb 6.4 oz)   SpO2: 97% 93% 100%     Wt Readings from Last 3 Encounters:  11/21/12 67.767 kg (149 lb 6.4 oz)  11/18/12 69.582 kg (153 lb 6.4 oz)  10/30/12 68.04 kg (150 lb)     Intake/Output Summary (Last 24 hours) at 11/21/12 1204 Last data filed at 11/21/12 0944  Gross per 24 hour  Intake    480 ml  Output      0 ml  Net    480 ml    Exam Awake Alert, Oriented X 3, No new F.N deficits, Normal affect [place].AT,PERRAL Supple Neck,No JVD, No cervical lymphadenopathy appriciated.  Symmetrical Chest wall movement, Good air movement bilaterally, CTAB RRR,No Gallops,Rubs or new Murmurs, No Parasternal Heave +ve B.Sounds, Abd Soft, Non tender, No organomegaly appriciated, No rebound - guarding or rigidity. No Cyanosis, Clubbing or edema, No new Rash or bruise   Left arm in sling   Data Review   Micro Results No results found for this or any previous visit (from the past 240 hour(s)).  Radiology Reports Dg Chest 2 View  11/19/2012   CLINICAL DATA:  Lost  consciousness, left shoulder pain.  EXAM: CHEST  2 VIEW  COMPARISON:  08/01/2012  FINDINGS: The heart size is enlarged. There is bilateral interstitial thickening likely chronic. There is no focal consolidation or pneumothorax. There is a trace right pleural effusion versus scarring.  There is a comminuted fracture of the left surgical neck of the proximal humerus.  IMPRESSION: Comminuted fracture of the left surgical neck of the proximal humerus.  Cardiomegaly.   Electronically Signed   By: Elige Ko   On: 11/19/2012 10:21   Ct Head Wo Contrast  11/19/2012   CLINICAL DATA:  Syncopal episode.  Fall.  EXAM: CT HEAD WITHOUT CONTRAST  CT CERVICAL SPINE WITHOUT CONTRAST  TECHNIQUE: Multidetector CT imaging of the head and cervical spine was performed following the standard protocol without intravenous contrast. Multiplanar CT image reconstructions of the cervical spine were also generated.  COMPARISON:  05/20/2012 MR. 05/02/2011 CT head. 08/04/2005 CT head and cervical spine CT.  FINDINGS: CT HEAD FINDINGS  No skull fracture or intracranial hemorrhage.  Prominent small vessel disease type changes without CT evidence of large acute infarct.  No intracranial mass lesion noted on this unenhanced exam.  Mild atrophy without hydrocephalus.  Vascular calcifications  CT CERVICAL SPINE FINDINGS  Head tilt to the left with curvature of the cervical spine. Rotation of C1 upon C2. If there is a high clinical suspicion of ligamentous injury, flexion and extension views or MR can be performed for further delineation.  No cervical spine fracture noted.  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  Well corticated left C3-4 foraminal bony structures seen on prior exam and felt unlikely to be related to acute injury.  Lung apices are clear.  Prominent carotid bifurcation calcifications.  IMPRESSION: No skull fracture or intracranial hemorrhage.  No cervical spine fracture.   Rotation of C1 upon C2 with head tilt to left as noted above.   Electronically Signed   By: Bridgett Larsson M.D.   On: 11/19/2012 10:43   Ct Cervical Spine Wo Contrast  11/19/2012   CLINICAL DATA:  Syncopal episode.  Fall.  EXAM: CT HEAD WITHOUT CONTRAST  CT CERVICAL SPINE WITHOUT CONTRAST  TECHNIQUE: Multidetector CT imaging of the head and cervical  spine was performed following the standard protocol without intravenous contrast. Multiplanar CT image reconstructions of the cervical spine were also generated.  COMPARISON:  05/20/2012 MR. 05/02/2011 CT head. 08/04/2005 CT head and cervical spine CT.  FINDINGS: CT HEAD FINDINGS  No skull fracture or intracranial hemorrhage.  Prominent small vessel disease type changes without CT evidence of large acute infarct.  No intracranial mass lesion noted on this unenhanced exam.  Mild atrophy without hydrocephalus.  Vascular calcifications  CT CERVICAL SPINE FINDINGS  Head tilt to the left with curvature of the cervical spine. Rotation of C1 upon C2. If there is a high clinical suspicion of ligamentous injury, flexion and extension views or MR can be performed for further delineation.  No cervical spine fracture noted.  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  Well corticated left C3-4 foraminal bony structures seen on prior exam and felt unlikely to be related to acute injury.  Lung apices are clear.  Prominent carotid bifurcation calcifications.  IMPRESSION: No skull fracture or intracranial hemorrhage.  No cervical spine fracture.  Rotation of C1 upon C2 with head tilt to left as noted above.   Electronically Signed   By: Bridgett Larsson M.D.   On: 11/19/2012 10:43   Dg Shoulder Left  11/19/2012   CLINICAL DATA:  Left shoulder pain, loss of consciousness.  EXAM: LEFT SHOULDER - 2+ VIEW  COMPARISON:  None.  FINDINGS: There is a comminuted fracture of the surgical neck of the left proximal humerus which also involves the  greater tuberosity. There is apex anterior angulation. There is no glenohumeral dislocation. There are mild degenerative changes of the acromioclavicular joint.  IMPRESSION: Comminuted fracture of the surgical neck of the left proximal humerus.   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 This was found to be concordant by Dr. Edwin Cap. Pathology was relayed to the patient by telephone. The patient reported doing well after the biopsy with tenderness at the site. Post biopsy instructions were reviewed and her questions were answered. She was encouraged to call [facility] for any additional concerns. She was asked to return in 6 months for diagnostic right mammography.  Pathology results were reported by Sonnie Alamo RN, BSN on November 08, 2012.   Electronically Signed   By: Jerene Dilling M.D.   On: 11/08/2012 10:03   11/11/2012   CLINICAL DATA:  Right breast calcifications.  EXAM: STEREOTACTIC VACUUM ASSIST RIGHT  COMPARISON:  Previous exams.  FINDINGS: I met with the patient and we discussed the procedure of stereotactic-guided biopsy, including benefits and alternatives. We discussed the high likelihood of a successful procedure. We discussed the risks of the procedure, including infection, bleeding, tissue injury, clip migration, and inadequate sampling. Informed, written consent was given. The usual time out protocol was performed immediately prior to the procedure.  Using sterile technique and 2% Lidocaine as local anesthetic, under stereotactic guidance, a 9 gauge Suros device was used to perform core needle biopsy of calcifications at 5 o'clock in the right breast using a medial to lateral approach. Specimen radiograph was performed,  showing calcification. Specimens with calcifications are identified for pathology.  At the conclusion of the procedure, a dumbbell-shaped tissue marker  clip was deployed into the biopsy cavity. Follow-up 2-view mammogram confirmed the clip to be in the appropriate position.  IMPRESSION: Stereotactic-guided biopsy of right breast calcifications. No apparent complications.  Electronically Signed: By: Jerene Dilling M.D. On: 11/07/2012 08:57    CBC  Recent Labs Lab 11/18/12 1608 11/19/12 0934 11/19/12 1544 11/20/12 0522  WBC 4.6 5.0 6.3 3.9*  HGB 9.3* 9.5* 9.2* 8.7*  HCT 27.5* 26.9* 26.4* 25.1*  PLT 308.0 260 295 276  MCV 90.0 86.8 87.7 87.2  MCH  --  30.6 30.6 30.2  MCHC 33.7 35.3 34.8 34.7  RDW 16.4* 15.8* 15.5 15.7*  LYMPHSABS 0.4* 0.6*  --   --   MONOABS 0.2 0.2  --   --   EOSABS 0.1 0.1  --   --   BASOSABS 0.0 0.0  --   --     Chemistries   Recent Labs Lab 11/18/12 1608 11/19/12 0934 11/19/12 1544 11/19/12 1954 11/20/12 0522 11/21/12 0507  NA 126* 128*  --  130* 129* 131*  K 3.5 2.8*  --  3.2* 2.9* 3.8  CL 83* 86*  --  88* 89* 88*  CO2 31 29  --  34* 34* 33*  GLUCOSE 98 108*  --  145* 90 138*  BUN 23 24*  --  20 20 18   CREATININE 1.2 1.19* 1.07 1.00 0.97 1.10  CALCIUM 8.7 8.8  --  9.1 8.6 9.1  MG  --   --   --   --  1.4* 1.4*  AST  --  21  --   --  16  --   ALT  --  14  --   --  11  --   ALKPHOS  --  99  --   --  87  --   BILITOT  --  0.5  --   --  0.5  --    ------------------------------------------------------------------------------------------------------------------ estimated creatinine clearance is 51.2 ml/min (by C-G formula based on Cr of 1.1). ------------------------------------------------------------------------------------------------------------------ No results found for this basename: HGBA1C,  in the last 72 hours ------------------------------------------------------------------------------------------------------------------ No results  found for this basename: CHOL, HDL, LDLCALC, TRIG, CHOLHDL, LDLDIRECT,  in the last 72 hours ------------------------------------------------------------------------------------------------------------------  Recent Labs  11/20/12 1308  TSH 2.427   ------------------------------------------------------------------------------------------------------------------ No results found for this basename: VITAMINB12, FOLATE, FERRITIN, TIBC, IRON, RETICCTPCT,  in the last 72 hours  Coagulation profile No results found for this basename: INR, PROTIME,  in the last 168 hours  No results found for this basename: DDIMER,  in the last 72 hours  Cardiac Enzymes  Recent Labs Lab 11/19/12 1206 11/19/12 1954 11/20/12 0005  TROPONINI <0.30 <0.30 <0.30   ------------------------------------------------------------------------------------------------------------------ No components found with this basename: POCBNP,      Time Spent in minutes   40   Asjah Rauda K M.D on 11/21/2012 at 12:04 PM  Between 7am to 7pm - Pager - [phone]  After 7pm go to [email or web address] - password [email or web address]  And look for the night coverage person covering for me after hours  [facility] Office  [phone]

## 2012-11-21 NOTE — Progress Notes (Signed)
  Echocardiogram 2D Echocardiogram has been performed.  [name] FRANCES 11/21/2012, 4:40 PM

## 2012-11-21 NOTE — Progress Notes (Signed)
Orthopedic Tech Progress Note Patient Details:  [name] [birth year] [id number]  Ortho Devices Type of Ortho Device: Arm sling Ortho Device/Splint Interventions: Application   Shawnie Pons 11/21/2012, 12:54 PM

## 2012-11-22 LAB — CBC
HCT: 26 % — ABNORMAL LOW (ref 36.0–46.0)
MCHC: 33.8 g/dL (ref 30.0–36.0)
RBC: 2.91 MIL/uL — ABNORMAL LOW (ref 3.87–5.11)
RDW: 16.2 % — ABNORMAL HIGH (ref 11.5–15.5)
WBC: 4.2 10*3/uL (ref 4.0–10.5)

## 2012-11-22 LAB — POTASSIUM: Potassium: 3.8 mEq/L (ref 3.5–5.1)

## 2012-11-22 LAB — MAGNESIUM: Magnesium: 1.7 mg/dL (ref 1.5–2.5)

## 2012-11-22 LAB — GLUCOSE, CAPILLARY
Glucose-Capillary: 100 mg/dL — ABNORMAL HIGH (ref 70–99)
Glucose-Capillary: 207 mg/dL — ABNORMAL HIGH (ref 70–99)
Glucose-Capillary: 158 mg/dL — ABNORMAL HIGH (ref 70–99)

## 2012-11-22 MED ORDER — INSULIN ASPART 100 UNIT/ML ~~LOC~~ SOLN: 0.0000 [IU] | Freq: Three times a day (TID) | SUBCUTANEOUS | Status: DC

## 2012-11-22 MED ORDER — HYDROCODONE-ACETAMINOPHEN 5-325 MG PO TABS: 1.0000 | ORAL_TABLET | ORAL | Status: DC | PRN

## 2012-11-22 NOTE — Progress Notes (Signed)
Orthopedics Progress Note  Subjective: I feel better in this position  Objective:  Filed Vitals:   11/22/12 0436  BP: 151/86  Pulse: 84  Temp: 98 F (36.7 C)  Resp: 18    General: Awake and alert  Musculoskeletal: L shoulder perfectly positioned in bed with sling in place and pillow behind the elbow Neurovascularly intact  Lab Results  Component Value Date   WBC 4.2 11/22/2012   HGB 8.8* 11/22/2012   HCT 26.0* 11/22/2012   MCV 89.3 11/22/2012   PLT 278 11/22/2012       Component Value Date/Time   NA 131* 11/21/2012 0507   NA 138 04/16/2012 0936   K 3.8 11/22/2012 0550   K 4.0 04/16/2012 0936   CL 88* 11/21/2012 0507   CL 98 04/16/2012 0936   CO2 33* 11/21/2012 0507   CO2 30* 04/16/2012 0936   GLUCOSE 138* 11/21/2012 0507   GLUCOSE 100* 04/16/2012 0936   BUN 18 11/21/2012 0507   BUN 9.8 04/16/2012 0936   CREATININE 1.10 11/21/2012 0507   CREATININE 1.1 04/16/2012 0936   CALCIUM 9.1 11/21/2012 0507   CALCIUM 8.9 04/16/2012 0936   GFRNONAA 50* 11/21/2012 0507   GFRAA 58* 11/21/2012 0507    Lab Results  Component Value Date   INR 1.00 04/28/2012   INR 1.34 12/05/2011   INR 1.10 05/02/2011    Assessment/Plan:  s/p fall with proximal humerus fracture.  Non operative care initiated  Will see her in the office next week for XRAYS Must keep the sling in place all the time  Almedia Balls. Ranell Patrick, MD 11/22/2012 1:38 PM

## 2012-11-22 NOTE — Progress Notes (Signed)
SUBJECTIVE:  Shoulder pain but no acute SOB or chest pain   PHYSICAL EXAM Filed Vitals:   11/21/12 0955 11/21/12 1410 11/21/12 2021 11/22/12 0436  BP: 126/71 149/80 157/91 151/86  Pulse: 84 89 88 84  Temp:  97.7 F (36.5 C) 98.2 F (36.8 C) 98 F (36.7 C)  TempSrc:  Oral Oral Oral  Resp:  18 18 18   Height:      Weight:    149 lb 7.6 oz (67.8 kg)  SpO2:  96% 97% 100%   General:  No acute distress Lungs:  Clear Heart:  RRR Abdomen:  Positive bowel sounds, no rebound no guarding Extremities:  No edema  LABS: Lab Results  Component Value Date   TROPONINI <0.30 11/20/2012   Results for orders placed during the hospital encounter of 11/19/12 (from the past 24 hour(s))  GLUCOSE, CAPILLARY     Status: Abnormal   Collection Time    11/21/12 11:48 AM      Result Value Range   Glucose-Capillary 130 (*) 70 - 99 mg/dL  GLUCOSE, CAPILLARY     Status: Abnormal   Collection Time    11/21/12  4:22 PM      Result Value Range   Glucose-Capillary 137 (*) 70 - 99 mg/dL  GLUCOSE, CAPILLARY     Status: Abnormal   Collection Time    11/21/12  9:14 PM      Result Value Range   Glucose-Capillary 110 (*) 70 - 99 mg/dL  CBC     Status: Abnormal   Collection Time    11/22/12  5:50 AM      Result Value Range   WBC 4.2  4.0 - 10.5 K/uL   RBC 2.91 (*) 3.87 - 5.11 MIL/uL   Hemoglobin 8.8 (*) 12.0 - 15.0 g/dL   HCT 82.9 (*) 56.2 - 13.0 %   MCV 89.3  78.0 - 100.0 fL   MCH 30.2  26.0 - 34.0 pg   MCHC 33.8  30.0 - 36.0 g/dL   RDW 86.5 (*) 78.4 - 69.6 %   Platelets 278  150 - 400 K/uL  MAGNESIUM     Status: None   Collection Time    11/22/12  5:50 AM      Result Value Range   Magnesium 1.7  1.5 - 2.5 mg/dL  POTASSIUM     Status: None   Collection Time    11/22/12  5:50 AM      Result Value Range   Potassium 3.8  3.5 - 5.1 mEq/L  GLUCOSE, CAPILLARY     Status: Abnormal   Collection Time    11/22/12  6:21 AM      Result Value Range   Glucose-Capillary 100 (*) 70 - 99 mg/dL     Intake/Output Summary (Last 24 hours) at 11/22/12 0859 Last data filed at 11/21/12 1827  Gross per 24 hour  Intake    720 ml  Output      0 ml  Net    720 ml    ASSESSMENT AND PLAN:  Syncope:  Difficult to know whether this was related to orthostasis/vagal vs a dysrhythmia associated with reduced EF.  She also has Class III CHF.  EF is now 20%.  We will plan an ICD.  However, we will do this after she has recovered from her shoulder further.  We will send her home with a LifeVest.  Discussed with Dr. Graciela Husbands and he will schedule her to come back for  an ICD.   Left humerus fracture:  She is going to be managed conservatively for this.    CHF:  Chronic systolic and diastolic.  She seems to be euvolemic at present.     [name] 11/22/2012 8:59 AM

## 2012-11-22 NOTE — Progress Notes (Addendum)
[facility]                                                                                Patient Demographics  [name], is a 67 y.o. female, DOB - Apr 20, 1945, ZOX:[id number]  Admit date - 11/19/2012   Admitting Physician Alba Cory, MD  Outpatient Primary MD for the patient is [name], MD  LOS - 3   Chief Complaint  Patient presents with  . Loss of Consciousness  . Fall        Assessment & Plan    1-Syncope: She and due to underlying Wenkebach.heart block, cardiology monitoring the patient, CRT vs CRTD per cardiology. Cardiology down wants to apply life quest, paperwork has been initiated by cardiology. Once life quest is arranged for patient will be discharged home with defibrillator life quest.     2-Hypokalemia and Low Mag - replaced and monitored.      3-Systolic Heart Failure: Compensated. resume lasix and continue ARB. Monitor renal function. Cardiology following, repeat echo gram ordered. He is now 10%. Management per cardiology.    4-Anemia: hb at baseline.     5-Left humerus fracture: Ortho consulted repeat CT scan noted, discussed the case with orthopedic again over the phone likely surgery in the outpatient setting.    6-Encephalopathy: Secondary to narcotics, resolved.     7-Hyponatremia: Chronic, sodium at 129 on 09-17-2012. She received fluids in the ED. Will resume lasix.      Code Status: Full  Family Communication: Husband  Disposition Plan: Home   Procedures echo gram   Consults  orthopedics Dr. Jillyn Hidden and Dr. Ranell Patrick, Dr. Patty Sermons cardiologist, EP consult also made   Medications  Scheduled Meds: . aspirin EC  81 mg Oral Daily  . atorvastatin  40 mg Oral q1800  . diclofenac sodium  2 g Topical QID  . ferrous fumarate-b12-vitamic C-folic acid  1 capsule Oral QPC breakfast  . furosemide  160 mg Oral BID  . gabapentin  300 mg Oral BID  . heparin  5,000 Units Subcutaneous Q8H  . isosorbide mononitrate   60 mg Oral Q1200  . losartan  50 mg Oral Daily  . pantoprazole  40 mg Oral Daily  . potassium chloride  40 mEq Oral Daily  . sodium chloride  3 mL Intravenous Q12H  . sodium chloride  3 mL Intravenous Q12H  . spironolactone  12.5 mg Oral Daily  . tiotropium  18 mcg Inhalation Daily  . traZODone  150 mg Oral QHS   Continuous Infusions:  PRN Meds:.acetaminophen, albuterol, HYDROcodone-acetaminophen, morphine injection, nitroGLYCERIN, ondansetron (ZOFRAN) IV, ondansetron, polyethylene glycol, sodium chloride, traMADol  DVT Prophylaxis   Heparin   Lab Results  Component Value Date   PLT 278 11/22/2012    Antibiotics     Anti-infectives   None          Subjective:   [name] today has, No headache, No chest pain, No abdominal pain - No Nausea, No new weakness tingling or numbness, No Cough - SOB. The left arm pain  Objective:   Filed Vitals:   11/21/12 1410 11/21/12 2021 11/22/12 0436 11/22/12 1033  BP: 149/80 157/91 151/86  Pulse: 89 88 84   Temp: 97.7 F (36.5 C) 98.2 F (36.8 C) 98 F (36.7 C)   TempSrc: Oral Oral Oral   Resp: 18 18 18    Height:      Weight:   67.8 kg (149 lb 7.6 oz)   SpO2: 96% 97% 100% 98%    Wt Readings from Last 3 Encounters:  11/22/12 67.8 kg (149 lb 7.6 oz)  11/18/12 69.582 kg (153 lb 6.4 oz)  10/30/12 68.04 kg (150 lb)     Intake/Output Summary (Last 24 hours) at 11/22/12 1129 Last data filed at 11/21/12 1827  Gross per 24 hour  Intake    480 ml  Output      0 ml  Net    480 ml    Exam Awake Alert, Oriented X 3, No new F.N deficits, Normal affect [place].AT,PERRAL Supple Neck,No JVD, No cervical lymphadenopathy appriciated.  Symmetrical Chest wall movement, Good air movement bilaterally, CTAB RRR,No Gallops,Rubs or new Murmurs, No Parasternal Heave +ve B.Sounds, Abd Soft, Non tender, No organomegaly appriciated, No rebound - guarding or rigidity. No Cyanosis, Clubbing or edema, No new Rash or bruise   Left arm in  sling   Data Review   Micro Results No results found for this or any previous visit (from the past 240 hour(s)).  Radiology Reports Dg Chest 2 View  11/19/2012   CLINICAL DATA:  Lost consciousness, left shoulder pain.  EXAM: CHEST  2 VIEW  COMPARISON:  08/01/2012  FINDINGS: The heart size is enlarged. There is bilateral interstitial thickening likely chronic. There is no focal consolidation or pneumothorax. There is a trace right pleural effusion versus scarring.  There is a comminuted fracture of the left surgical neck of the proximal humerus.  IMPRESSION: Comminuted fracture of the left surgical neck of the proximal humerus.  Cardiomegaly.   Electronically Signed   By: Elige Ko   On: 11/19/2012 10:21   Ct Head Wo Contrast  11/19/2012   CLINICAL DATA:  Syncopal episode.  Fall.  EXAM: CT HEAD WITHOUT CONTRAST  CT CERVICAL SPINE WITHOUT CONTRAST  TECHNIQUE: Multidetector CT imaging of the head and cervical spine was performed following the standard protocol without intravenous contrast. Multiplanar CT image reconstructions of the cervical spine were also generated.  COMPARISON:  05/20/2012 MR. 05/02/2011 CT head. 08/04/2005 CT head and cervical spine CT.  FINDINGS: CT HEAD FINDINGS  No skull fracture or intracranial hemorrhage.  Prominent small vessel disease type changes without CT evidence of large acute infarct.  No intracranial mass lesion noted on this unenhanced exam.  Mild atrophy without hydrocephalus.  Vascular calcifications  CT CERVICAL SPINE FINDINGS  Head tilt to the left with curvature of the cervical spine. Rotation of C1 upon C2. If there is a high clinical suspicion of ligamentous injury, flexion and extension views or MR can be performed for further delineation.  No cervical spine fracture noted.  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  Well corticated left C3-4 foraminal bony structures seen on prior exam and felt unlikely to  be related to acute injury.  Lung apices are clear.  Prominent carotid bifurcation calcifications.  IMPRESSION: No skull fracture or intracranial hemorrhage.  No cervical spine fracture.  Rotation of C1 upon C2 with head tilt to left as noted above.   Electronically Signed   By: Bridgett Larsson M.D.   On: 11/19/2012 10:43   Ct Cervical Spine Wo Contrast  11/19/2012   CLINICAL  DATA:  Syncopal episode.  Fall.  EXAM: CT HEAD WITHOUT CONTRAST  CT CERVICAL SPINE WITHOUT CONTRAST  TECHNIQUE: Multidetector CT imaging of the head and cervical spine was performed following the standard protocol without intravenous contrast. Multiplanar CT image reconstructions of the cervical spine were also generated.  COMPARISON:  05/20/2012 MR. 05/02/2011 CT head. 08/04/2005 CT head and cervical spine CT.  FINDINGS: CT HEAD FINDINGS  No skull fracture or intracranial hemorrhage.  Prominent small vessel disease type changes without CT evidence of large acute infarct.  No intracranial mass lesion noted on this unenhanced exam.  Mild atrophy without hydrocephalus.  Vascular calcifications  CT CERVICAL SPINE FINDINGS  Head tilt to the left with curvature of the cervical spine. Rotation of C1 upon C2. If there is a high clinical suspicion of ligamentous injury, flexion and extension views or MR can be performed for further delineation.  No cervical spine fracture noted.  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  Well corticated left C3-4 foraminal bony structures seen on prior exam and felt unlikely to be related to acute injury.  Lung apices are clear.  Prominent carotid bifurcation calcifications.  IMPRESSION: No skull fracture or intracranial hemorrhage.  No cervical spine fracture.  Rotation of C1 upon C2 with head tilt to left as noted above.   Electronically Signed   By: Bridgett Larsson M.D.   On: 11/19/2012 10:43   Dg Shoulder Left  11/19/2012   CLINICAL DATA:  Left shoulder pain,  loss of consciousness.  EXAM: LEFT SHOULDER - 2+ VIEW  COMPARISON:  None.  FINDINGS: There is a comminuted fracture of the surgical neck of the left proximal humerus which also involves the greater tuberosity. There is apex anterior angulation. There is no glenohumeral dislocation. There are mild degenerative changes of the acromioclavicular joint.  IMPRESSION: Comminuted fracture of the surgical neck of the left proximal humerus.   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 This was found to be concordant by Dr. Edwin Cap. Pathology was relayed to the patient by telephone. The patient reported doing well after the biopsy with tenderness at the site. Post biopsy instructions were reviewed and her questions were answered. She was encouraged to call [facility] for any additional concerns. She was asked to return in 6 months for diagnostic right mammography.  Pathology results were reported by Sonnie Alamo RN, BSN on November 08, 2012.   Electronically Signed   By: Jerene Dilling M.D.   On: 11/08/2012 10:03   11/11/2012   CLINICAL DATA:  Right breast calcifications.  EXAM: STEREOTACTIC VACUUM ASSIST RIGHT  COMPARISON:  Previous exams.  FINDINGS: I met with the patient and we discussed the procedure of stereotactic-guided biopsy, including benefits and alternatives. We discussed the high likelihood of a successful procedure. We discussed the risks of the procedure, including infection, bleeding, tissue injury, clip migration, and inadequate sampling. Informed, written consent was given. The usual time out protocol was performed immediately prior to the procedure.  Using sterile technique and 2% Lidocaine as local anesthetic, under stereotactic  guidance, a 9 gauge Suros device was used to perform core needle biopsy of calcifications at 5 o'clock in the right breast using a  medial to lateral approach. Specimen radiograph was performed, showing calcification. Specimens with calcifications are identified for pathology.  At the conclusion of the procedure, a dumbbell-shaped tissue marker clip was deployed into the biopsy cavity. Follow-up 2-view mammogram confirmed the clip to be in the appropriate position.  IMPRESSION: Stereotactic-guided biopsy of right breast calcifications. No apparent complications.  Electronically Signed: By: Jerene Dilling M.D. On: 11/07/2012 08:57    CBC  Recent Labs Lab 11/18/12 1608 11/19/12 0934 11/19/12 1544 11/20/12 0522 11/22/12 0550  WBC 4.6 5.0 6.3 3.9* 4.2  HGB 9.3* 9.5* 9.2* 8.7* 8.8*  HCT 27.5* 26.9* 26.4* 25.1* 26.0*  PLT 308.0 260 295 276 278  MCV 90.0 86.8 87.7 87.2 89.3  MCH  --  30.6 30.6 30.2 30.2  MCHC 33.7 35.3 34.8 34.7 33.8  RDW 16.4* 15.8* 15.5 15.7* 16.2*  LYMPHSABS 0.4* 0.6*  --   --   --   MONOABS 0.2 0.2  --   --   --   EOSABS 0.1 0.1  --   --   --   BASOSABS 0.0 0.0  --   --   --     Chemistries   Recent Labs Lab 11/18/12 1608 11/19/12 0934 11/19/12 1544 11/19/12 1954 11/20/12 0522 11/21/12 0507 11/22/12 0550  NA 126* 128*  --  130* 129* 131*  --   K 3.5 2.8*  --  3.2* 2.9* 3.8 3.8  CL 83* 86*  --  88* 89* 88*  --   CO2 31 29  --  34* 34* 33*  --   GLUCOSE 98 108*  --  145* 90 138*  --   BUN 23 24*  --  20 20 18   --   CREATININE 1.2 1.19* 1.07 1.00 0.97 1.10  --   CALCIUM 8.7 8.8  --  9.1 8.6 9.1  --   MG  --   --   --   --  1.4* 1.4* 1.7  AST  --  21  --   --  16  --   --   ALT  --  14  --   --  11  --   --   ALKPHOS  --  99  --   --  87  --   --   BILITOT  --  0.5  --   --  0.5  --   --    ------------------------------------------------------------------------------------------------------------------ estimated creatinine clearance is 51.2 ml/min  (by C-G formula based on Cr of 1.1). ------------------------------------------------------------------------------------------------------------------ No results found for this basename: HGBA1C,  in the last 72 hours ------------------------------------------------------------------------------------------------------------------ No results found for this basename: CHOL, HDL, LDLCALC, TRIG, CHOLHDL, LDLDIRECT,  in the last 72 hours ------------------------------------------------------------------------------------------------------------------  Recent Labs  11/20/12 1308  TSH 2.427   ------------------------------------------------------------------------------------------------------------------ No results found for this basename: VITAMINB12, FOLATE, FERRITIN, TIBC, IRON, RETICCTPCT,  in the last 72 hours  Coagulation profile No results found for this basename: INR, PROTIME,  in the last 168 hours  No results found for this basename: DDIMER,  in the last 72 hours  Cardiac Enzymes  Recent Labs Lab 11/19/12 1206 11/19/12 1954 11/20/12 0005  TROPONINI <0.30 <0.30 <0.30   ------------------------------------------------------------------------------------------------------------------ No components found with this basename: POCBNP,      Time Spent in minutes   40   Susa Raring K M.D on 11/22/2012 at 11:29 AM  Between 7am to 7pm - Pager - [phone]  After 7pm go to [email or web address] - password [email or web address]  And look for the night  coverage person covering for me after hours  [facility] Office  [phone]   Ortho attending note:  Surgery is NOT likely based upon the CT scan.  Non surgical care is planned.  Verlee Rossetti, MD  [phone]

## 2012-11-22 NOTE — Progress Notes (Signed)
Room [facility] 2W26 - Maayan Jenning - [facility] RN Visit-R.Darian Cansler RN  Related admission to [facility] diagnosis of CAD.  Pt is FULL code.  Pt alert & oriented, sitting up in lounge chair, with complaints of pain in L/fx shoulder @ 5/10.  Husband present. Patient's home medication list is on shadow chart.   Per pt and EPIC notes, pt to get Life vest today and be discharged home.  Pt to be scheduled for ICD after L/shoulder healing per notes.   Husband and pt encouraged to call [facility] with any problems or if pt starts having symptoms.  Husband and pt denied any further DME or supply needs at this time.  Please call [facility] @ [phone]- ask for RN Liaison or after hours,ask for on-call RN with any hospice needs.   Thank you.  Joneen Boers, RN  [facility]  Hospice Liaison  [phone])

## 2012-11-23 LAB — POTASSIUM: Potassium: 4.1 mEq/L (ref 3.5–5.1)

## 2012-11-23 LAB — GLUCOSE, CAPILLARY: Glucose-Capillary: 112 mg/dL — ABNORMAL HIGH (ref 70–99)

## 2012-11-23 LAB — CBC
HCT: 28.4 % — ABNORMAL LOW (ref 36.0–46.0)
Hemoglobin: 9.3 g/dL — ABNORMAL LOW (ref 12.0–15.0)
MCH: 29.7 pg (ref 26.0–34.0)
Platelets: 330 10*3/uL (ref 150–400)
RBC: 3.13 MIL/uL — ABNORMAL LOW (ref 3.87–5.11)
WBC: 4.6 10*3/uL (ref 4.0–10.5)

## 2012-11-23 LAB — MAGNESIUM: Magnesium: 1.6 mg/dL (ref 1.5–2.5)

## 2012-11-23 MED ORDER — MAGNESIUM OXIDE 400 MG PO TABS: 400.0000 mg | ORAL_TABLET | Freq: Every day | ORAL | Status: AC

## 2012-11-23 NOTE — Progress Notes (Signed)
Subjective:     Patient reports pain as 3 on 0-10 scale.No Major issues today. Pain over FX site of Left Shoulder.Hbg9.3.  Objective: Vital signs in last 24 hours: Temp:  [98.5 F (36.9 C)-98.7 F (37.1 C)] 98.6 F (37 C) (11/22 0509) Pulse Rate:  [85-93] 85 (11/22 0509) Resp:  [18] 18 (11/22 0509) BP: (127-146)/(75-89) 127/75 mmHg (11/22 0509) SpO2:  [92 %-98 %] 97 % (11/22 0509) Weight:  [64.3 kg (141 lb 12.1 oz)] 64.3 kg (141 lb 12.1 oz) (11/22 0509)  Intake/Output from previous day: 11/21 0701 - 11/22 0700 In: 720 [P.O.:720] Out: 900 [Urine:900] Intake/Output this shift: Total I/O In: 240 [P.O.:240] Out: 500 [Urine:500]   Recent Labs  11/22/12 0550 11/23/12 0600  HGB 8.8* 9.3*    Recent Labs  11/22/12 0550 11/23/12 0600  WBC 4.2 4.6  RBC 2.91* 3.13*  HCT 26.0* 28.4*  PLT 278 330    Recent Labs  11/21/12 0507 11/22/12 0550 11/23/12 0600  NA 131*  --   --   K 3.8 3.8 4.1  CL 88*  --   --   CO2 33*  --   --   BUN 18  --   --   CREATININE 1.10  --   --   GLUCOSE 138*  --   --   CALCIUM 9.1  --   --    No results found for this basename: LABPT, INR,  in the last 72 hours  Neurovascular intact  Assessment/Plan:     Plan for discharge tomorrow  Layney Gillson A 11/23/2012, 10:03 AM

## 2012-11-23 NOTE — Progress Notes (Signed)
[employer] life representative came to fit patient for life vest. Went over discharge instructions with the patient. The patient had no additional questions or concerns related to discharge. IV was d/c'd and patient taken off the cardiac monitor. Patient discharged home with husband. Stanton Kidney R

## 2012-11-23 NOTE — Discharge Summary (Signed)
[facility]                                                                                   [name], is a 67 y.o. female  DOB [birth year]  MRN [id number].  Admission date:  11/19/2012  Admitting Physician  Alba Cory, MD  Discharge Date:  11/23/2012   Primary MD  [name], MD  Recommendations for primary care physician for things to follow:   Follow CBC BMP, magnesium and ,diuretic dose closely   Admission Diagnosis  Hypokalemia [276.8] Syncope [780.2] Chronic systolic CHF (congestive heart failure), NYHA class 3 [428.22, 428.0] Comminuted left humeral fracture, closed, initial encounter [812.20]  Discharge Diagnosis   syncopal episode likely due to dysrhythmia with left humeral fracture  Active Problems:   HYPERTENSION   CORONARY ARTERY DISEASE   Iron deficiency anemia   Chronic systolic CHF (congestive heart failure)   Dementia   Fracture of left humerus   Syncope   1St degree AV block   Orthostatic hypotension   Chronic hyponatremia   Hypokalemia   Hypomagnesemia   History of bradycardia      Past Medical History  Diagnosis Date  . DM   . DYSLIPIDEMIA   . OBESITY NOS   . DEPRESSION, RECURRENT, IN PARTIAL REMISSION   . HYPERTENSION   . CORONARY ARTERY DISEASE     a. s/p Q wave MI 2002 with RCA stent. b. Abnl nuc 04/2012 with subsequent cath showing patent stents in RCA, chronic diffuse disease in dLAD, 20% LM, EF 20%, no focal targets for PCI.  [name] Chronic systolic CHF (congestive heart failure)     a. Last EF 20% by cath 04/29/2012, 40-45% by echo 04/24/12.  [name] GERD   . DIVERTICULOSIS, COLON, WITH HEMORRHAGE   . BACK PAIN, LUMBAR, WITH RADICULOPATHY   . OBSTRUCTIVE SLEEP APNEA   . Anemia, iron deficiency   . Hyponatremia   . Tobacco abuse   . Moderate COPD (chronic obstructive pulmonary disease) 11/07/2005    Spirometry 03/09/11>>FEV1 2.02 (88%), FEV1% 76    . Anxiety   . Arthritis   . Myocardial infarction 2001  . Dementia     a.  Saw neuro 08/2012: concerning for early dementia, labs unremarkable except elev CRP.  [name] Syncope     a. 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 BB discontinued. b. Recurrence in 11/2012.  [name] Chronic edema   . Weight loss     CT abd pelvis 05/2012: mild small bowel disention. No significant acute changes. Mod-severe gastritis by EGD 06/06/2012 no clear neoplastic changes. Neg small bowel follow through except for diverticulum on 06/11/12.   . Gastritis     a. by EGD 05/2012.    Past Surgical History  Procedure Laterality Date  . Uvuloplasty    . Total abdominal hysterectomy w/ bilateral salpingoophorectomy    . Abdominal hysterectomy    . Cholecystectomy  2004  . Coronary angioplasty with stent placement  2002    last 04-29-12  . Esophagogastroduodenoscopy (egd) with propofol N/A 06/06/2012    Procedure: ESOPHAGOGASTRODUODENOSCOPY (EGD) WITH PROPOFOL;  Surgeon: Rachael Fee, MD;  Location: [facility];  Service: Endoscopy;  Laterality: N/A;     Discharge Condition: stable   Follow-up Information   Follow up with [name], MD. Schedule an appointment as soon as possible for a visit in 1 week. (And your heart Dr.)    Specialty:  Internal Medicine   Contact information:   [address] [place] [address] [phone]       Follow up with Verlee Rossetti, MD. Call in 2 days. [phone])    Specialty:  Orthopedic Surgery   Contact information:   [address] [place] [address] [phone]         Consults obtained - cardiology, orthopedics   Discharge Medications      Medication List         acetaminophen 500 MG tablet  Commonly known as:  TYLENOL  Take 500 mg by mouth every 6 (six) hours as needed for pain.     albuterol (2.5 MG/3ML) 0.083% nebulizer solution  Commonly known as:  PROVENTIL  Take 2.5 mg by nebulization every 6 (six) hours as needed for wheezing.     aspirin 81 MG tablet  Take  81 mg by mouth daily.     atorvastatin 40 MG tablet  Commonly known as:  LIPITOR  Take 40 mg by mouth every morning.     diclofenac sodium 1 % Gel  Commonly known as:  VOLTAREN  Apply 2 g topically 4 (four) times daily.     feeding supplement (GLUCERNA SHAKE) Liqd  Take 237 mLs by mouth as needed.     FERRAPLUS 90 90-1 MG Tabs  Take 1 tablet by mouth daily.     furosemide 80 MG tablet  Commonly known as:  LASIX  Take 160 mg by mouth 2 (two) times daily.     gabapentin 300 MG capsule  Commonly known as:  NEURONTIN  Take 1 capsule (300 mg total) by mouth 2 (two) times daily.     hydrALAZINE 50 MG tablet  Commonly known as:  APRESOLINE  Take 50 mg by mouth every 8 (eight) hours.     HYDROcodone-acetaminophen 5-325 MG per tablet  Commonly known as:  NORCO/VICODIN  Take 1-2 tablets by mouth every 4 (four) hours as needed for moderate pain.     hydrOXYzine 10 MG tablet  Commonly known as:  ATARAX/VISTARIL  Take 10 mg by mouth 2 (two) times daily.     isosorbide mononitrate 60 MG 24 hr tablet  Commonly known as:  IMDUR  Take 60 mg by mouth daily at 12 noon.     losartan 50 MG tablet  Commonly known as:  COZAAR  Take 1 tablet (50 mg total) by mouth daily.     magnesium oxide 400 MG tablet  Commonly known as:  MAG-OX  Take 1 tablet (400 mg total) by mouth daily.     metolazone 2.5 MG tablet  Commonly known as:  ZAROXOLYN  Take 1 tablet (2.5 mg total) by mouth 3 (three) times a week.     nitroGLYCERIN 0.4 MG SL tablet  Commonly known as:  NITROSTAT  Place 1 tablet (0.4 mg total) under the tongue every 5 (five) minutes x 3 doses as needed for chest pain.     omeprazole 40 MG capsule  Commonly known as:  PRILOSEC  Take 40 mg by mouth daily.     polyethylene glycol packet  Commonly known as:  MIRALAX / GLYCOLAX  Take 17 g by mouth  daily as needed (for constipation).     potassium chloride 10 MEQ tablet  Commonly known as:  K-DUR,KLOR-CON  Take 10-20 mEq by mouth  See admin instructions. Take 3 tablets twice daily on days that Metolazone is taken. Take 2 tablets twice daily on other days     SIMPLE DIAGNOSTICS LANCING DEV Misc     sitaGLIPtin-metformin 50-500 MG per tablet  Commonly known as:  JANUMET  Take 1 tablet by mouth 2 (two) times daily with a meal.     spironolactone 25 MG tablet  Commonly known as:  ALDACTONE  Take 1 tablet (25 mg total) by mouth daily.     tiotropium 18 MCG inhalation capsule  Commonly known as:  SPIRIVA  Place 18 mcg into inhaler and inhale daily.     traMADol 50 MG tablet  Commonly known as:  ULTRAM  Take 50-100 mg by mouth every 6 (six) hours as needed for moderate pain.     traZODone 100 MG tablet  Commonly known as:  DESYREL  Take 150 mg by mouth at bedtime.     venlafaxine XR 150 MG 24 hr capsule  Commonly known as:  EFFEXOR-XR  Take 150 mg by mouth every morning.         Diet and Activity recommendation: See Discharge Instructions below   Discharge Instructions     Keep the left arm in the sling.  No use of the left arm.  No therapy for the left arm at this time. Prop pillow behind the left elbow while in bed or in a chair/recliner.  Follow up with Dr Ranell Patrick in one week  [phone]   Follow with Primary MD [name], MD in 7 days   Get CBC, CMP, checked 7 days by Primary MD and again as instructed by your Primary MD. Get a 2 view Chest X ray done next visit if you had Pneumonia of Lung problems at the Hospital.  Get Medicines reviewed and adjusted.  Please request your Prim.MD to go over all Hospital Tests and Procedure/Radiological results at the follow up, please get all Hospital records sent to your Prim MD by signing hospital release before you go home.  Activity: As tolerated with Full fall precautions use walker/cane & assistance as needed   Diet:  Heart healthy low carbohydrate,  Fluid restriction 1.8 lit/day, Aspiration precautions.  For Heart failure patients - Check your  Weight same time everyday, if you gain over 2 pounds, or you develop in leg swelling, experience more shortness of breath or chest pain, call your Primary MD immediately. Follow Cardiac Low Salt Diet and 1.8 lit/day fluid restriction.  Disposition Home    If you experience worsening of your admission symptoms, develop shortness of breath, life threatening emergency, suicidal or homicidal thoughts you must seek medical attention immediately by calling 911 or calling your MD immediately  if symptoms less severe.  You Must read complete instructions/literature along with all the possible adverse reactions/side effects for all the Medicines you take and that have been prescribed to you. Take any new Medicines after you have completely understood and accpet all the possible adverse reactions/side effects.   Do not drive and provide baby sitting services if your were admitted for syncope or siezures until you have seen by Primary MD or a Neurologist and advised to do so again.  Do not drive when taking Pain medications.    Do not take more than prescribed Pain, Sleep and Anxiety Medications  Special Instructions: If  you have smoked or chewed Tobacco  in the last 2 yrs please stop smoking, stop any regular Alcohol  and or any Recreational drug use.  Wear Seat belts while driving.   Please note  You were cared for by a hospitalist during your hospital stay. If you have any questions about your discharge medications or the care you received while you were in the hospital after you are discharged, you can call the unit and asked to speak with the hospitalist on call if the hospitalist that took care of you is not available. Once you are discharged, your primary care physician will handle any further medical issues. Please note that NO REFILLS for any discharge medications will be authorized once you are discharged, as it is imperative that you return to your primary care physician (or establish a  relationship with a primary care physician if you do not have one) for your aftercare needs so that they can reassess your need for medications and monitor your lab values.    Major procedures and Radiology Reports - PLEASE review detailed and final reports for all details, in brief -   Defibrillator life vest placement   Dg Chest 2 View  11/19/2012   CLINICAL DATA:  Lost consciousness, left shoulder pain.  EXAM: CHEST  2 VIEW  COMPARISON:  08/01/2012  FINDINGS: The heart size is enlarged. There is bilateral interstitial thickening likely chronic. There is no focal consolidation or pneumothorax. There is a trace right pleural effusion versus scarring.  There is a comminuted fracture of the left surgical neck of the proximal humerus.  IMPRESSION: Comminuted fracture of the left surgical neck of the proximal humerus.  Cardiomegaly.   Electronically Signed   By: Elige Ko   On: 11/19/2012 10:21   Ct Head Wo Contrast  11/19/2012   CLINICAL DATA:  Syncopal episode.  Fall.  EXAM: CT HEAD WITHOUT CONTRAST  CT CERVICAL SPINE WITHOUT CONTRAST  TECHNIQUE: Multidetector CT imaging of the head and cervical spine was performed following the standard protocol without intravenous contrast. Multiplanar CT image reconstructions of the cervical spine were also generated.  COMPARISON:  05/20/2012 MR. 05/02/2011 CT head. 08/04/2005 CT head and cervical spine CT.  FINDINGS: CT HEAD FINDINGS  No skull fracture or intracranial hemorrhage.  Prominent small vessel disease type changes without CT evidence of large acute infarct.  No intracranial mass lesion noted on this unenhanced exam.  Mild atrophy without hydrocephalus.  Vascular calcifications  CT CERVICAL SPINE FINDINGS  Head tilt to the left with curvature of the cervical spine. Rotation of C1 upon C2. If there is a high clinical suspicion of ligamentous injury, flexion and extension views or MR can be performed for further delineation.  No cervical spine fracture  noted.  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  Well corticated left C3-4 foraminal bony structures seen on prior exam and felt unlikely to be related to acute injury.  Lung apices are clear.  Prominent carotid bifurcation calcifications.  IMPRESSION: No skull fracture or intracranial hemorrhage.  No cervical spine fracture.  Rotation of C1 upon C2 with head tilt to left as noted above.   Electronically Signed   By: Bridgett Larsson M.D.   On: 11/19/2012 10:43   Ct Cervical Spine Wo Contrast  11/19/2012   CLINICAL DATA:  Syncopal episode.  Fall.  EXAM: CT HEAD WITHOUT CONTRAST  CT CERVICAL SPINE WITHOUT CONTRAST  TECHNIQUE: Multidetector CT imaging of the head and cervical  spine was performed following the standard protocol without intravenous contrast. Multiplanar CT image reconstructions of the cervical spine were also generated.  COMPARISON:  05/20/2012 MR. 05/02/2011 CT head. 08/04/2005 CT head and cervical spine CT.  FINDINGS: CT HEAD FINDINGS  No skull fracture or intracranial hemorrhage.  Prominent small vessel disease type changes without CT evidence of large acute infarct.  No intracranial mass lesion noted on this unenhanced exam.  Mild atrophy without hydrocephalus.  Vascular calcifications  CT CERVICAL SPINE FINDINGS  Head tilt to the left with curvature of the cervical spine. Rotation of C1 upon C2. If there is a high clinical suspicion of ligamentous injury, flexion and extension views or MR can be performed for further delineation.  No cervical spine fracture noted.  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  Well corticated left C3-4 foraminal bony structures seen on prior exam and felt unlikely to be related to acute injury.  Lung apices are clear.  Prominent carotid bifurcation calcifications.  IMPRESSION: No skull fracture or intracranial hemorrhage.  No cervical spine fracture.  Rotation of  C1 upon C2 with head tilt to left as noted above.   Electronically Signed   By: Bridgett Larsson M.D.   On: 11/19/2012 10:43   Ct Shoulder Left Wo Contrast  11/21/2012   CLINICAL DATA:  Shoulder pain and swelling after falling 4 days ago. Evaluate proximal humeral fracture.  EXAM: CT OF THE LEFT SHOULDER WITHOUT CONTRAST  TECHNIQUE: Multidetector CT imaging was performed according to the standard protocol. Multiplanar CT image reconstructions were also generated.  COMPARISON:  Left shoulder radiographs 11/19/2012.  FINDINGS: Comminuted fracture of the left humeral neck has 3 major fragments and demonstrates persistent superior displacement by approximately 3 cm. There is also mild apex anterior and lateral angulation. This fracture involves the greater tuberosity. The lesser tuberosity and humeral head articular surface are intact.  The glenohumeral articulation appears preserved. There is no significant narrowing of the subacromial space. There is no evidence of scapular fracture. The left clavicle and acromioclavicular joint are intact. There are mild-to-moderate acromioclavicular degenerative changes.  There is edema within rotator cuff and deltoid musculature. There is an associated left shoulder joint effusion.  IMPRESSION: Stable comminuted fracture of the left humeral neck as described. This fracture demonstrates moderate superior displacement and mild angulation as described. No involvement of the humeral head articular surface or dislocation demonstrated.   Electronically Signed   By: Roxy Horseman M.D.   On: 11/21/2012 11:52   Dg Shoulder Left  11/19/2012   CLINICAL DATA:  Left shoulder pain, loss of consciousness.  EXAM: LEFT SHOULDER - 2+ VIEW  COMPARISON:  None.  FINDINGS: There is a comminuted fracture of the surgical neck of the left proximal humerus which also involves the greater tuberosity. There is apex anterior angulation. There is no glenohumeral dislocation. There are mild degenerative  changes of the acromioclavicular joint.  IMPRESSION: Comminuted fracture of the surgical neck of the left proximal humerus.   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 This was found to be concordant by Dr. Edwin Cap. Pathology was relayed to the patient by telephone. The patient reported doing well after the biopsy with tenderness at the site. Post biopsy  instructions were reviewed and her questions were answered. She was encouraged to call [facility] for any additional concerns. She was asked to return in 6 months for diagnostic right mammography.  Pathology results were reported by Sonnie Alamo RN, BSN on November 08, 2012.   Electronically Signed   By: Jerene Dilling M.D.   On: 11/08/2012 10:03   11/11/2012   CLINICAL DATA:  Right breast calcifications.  EXAM: STEREOTACTIC VACUUM ASSIST RIGHT  COMPARISON:  Previous exams.  FINDINGS: I met with the patient and we discussed the procedure of stereotactic-guided biopsy, including benefits and alternatives. We discussed the high likelihood of a successful procedure. We discussed the risks of the procedure, including infection, bleeding, tissue injury, clip migration, and inadequate sampling. Informed, written consent was given. The usual time out protocol was performed immediately prior to the procedure.  Using sterile technique and 2% Lidocaine as local anesthetic, under stereotactic guidance, a 9 gauge Suros device was used to perform core needle biopsy of calcifications at 5 o'clock in the right breast using a medial to lateral approach. Specimen radiograph was performed, showing calcification. Specimens with calcifications are identified for pathology.  At the conclusion of the procedure, a  dumbbell-shaped tissue marker clip was deployed into the biopsy cavity. Follow-up 2-view mammogram confirmed the clip to be in the appropriate position.  IMPRESSION: Stereotactic-guided biopsy of right breast calcifications. No apparent complications.  Electronically Signed: By: Jerene Dilling M.D. On: 11/07/2012 08:57    Micro Results     No results found for this or any previous visit (from the past 240 hour(s)).   History of present illness and  Hospital Course:     Kindly see H&P for history of present illness and admission details, please review complete Labs, Consult reports and Test reports for all details in brief [name], is a 67 y.o. female, patient with history of chronic systolic heart failure EF 10% currently compensated, diabetes mellitus type 2, anemia of chronic disease,  was admitted to the hospital after a syncopal event at home due to which she incurred a fall and fractured her left humerus, she was seen by cardiology and orthopedics, her left arm has been placed in a sling and she will follow with orthopedics in the outpatient setting for eventual surgical correction in the next week or so.   Syncope was thought to be secondary to dysrhythmia, cardiology saw the patient, due to her severely depressed EF she was placed on defib related life Chad and she will continue to follow with her cardiologist on a close basis. Note patient was compensated from CHF standpoint this admission.    She had issues with low magnesium and potassium, she has been placed on appropriate supplementations upon discharge.   For her type 2 diabetes mellitus she will commence her home regimen post discharge.     Today   Subjective:   [name] today has no headache,no chest abdominal pain,no new weakness tingling or numbness, feels much better wants to go home today.   Objective:   Blood pressure 127/75, pulse 85, temperature 98.6 F (37 C), temperature source Oral,  resp. rate 18, height 5\' 9"  (1.753 m), weight 64.3 kg (141 lb 12.1 oz), SpO2 97.00%.   Intake/Output Summary (Last 24 hours) at 11/23/12 1203 Last data filed at 11/23/12 0900  Gross per 24 hour  Intake    720 ml  Output   1400 ml  Net   -680 ml  Exam Awake Alert, Oriented *3, No new F.N deficits, Normal affect [place].AT,PERRAL Supple Neck,No JVD, No cervical lymphadenopathy appriciated.  Symmetrical Chest wall movement, Good air movement bilaterally, CTAB RRR,No Gallops,Rubs or new Murmurs, No Parasternal Heave +ve B.Sounds, Abd Soft, Non tender, No organomegaly appriciated, No rebound -guarding or rigidity. No Cyanosis, Clubbing or edema, No new Rash or bruise, right arm in sling  Data Review   CBC w Diff: Lab Results  Component Value Date   WBC 4.6 11/23/2012   WBC 4.5 09/17/2012   HGB 9.3* 11/23/2012   HGB 9.6* 09/17/2012   HCT 28.4* 11/23/2012   HCT 29.1* 09/17/2012   PLT 330 11/23/2012   PLT 307 09/17/2012   LYMPHOPCT 12 11/19/2012   LYMPHOPCT 22.3 09/17/2012   MONOPCT 5 11/19/2012   MONOPCT 5.6 09/17/2012   EOSPCT 1 11/19/2012   EOSPCT 3.1 09/17/2012   BASOPCT 0 11/19/2012   BASOPCT 0.4 09/17/2012    CMP: Lab Results  Component Value Date   NA 131* 11/21/2012   NA 138 04/16/2012   K 4.1 11/23/2012   K 4.0 04/16/2012   CL 88* 11/21/2012   CL 98 04/16/2012   CO2 33* 11/21/2012   CO2 30* 04/16/2012   BUN 18 11/21/2012   BUN 9.8 04/16/2012   CREATININE 1.10 11/21/2012   CREATININE 1.1 04/16/2012   PROT 5.5* 11/20/2012   PROT 6.2 08/16/2012   PROT 6.5 04/16/2012   ALBUMIN 2.5* 11/20/2012   ALBUMIN 2.8* 04/16/2012   BILITOT 0.5 11/20/2012   BILITOT 0.48 04/16/2012   ALKPHOS 87 11/20/2012   ALKPHOS 88 04/16/2012   AST 16 11/20/2012   AST 13 04/16/2012   ALT 11 11/20/2012   ALT 7 04/16/2012  .   Total Time in preparing paper work, data evaluation and todays exam - 35 minutes  Leroy Sea M.D on 11/23/2012 at 12:03 PM  [facility] Office   [phone]

## 2012-11-23 NOTE — ED Provider Notes (Signed)
Medical screening examination/treatment/procedure(s) were conducted as a shared visit with non-physician practitioner(s) and myself.  I personally evaluated the patient during the encounter.  EKG Interpretation    Date/Time:  Tuesday November 19 2012 08:39:06 EST Ventricular Rate:  77 PR Interval:  257 QRS Duration: 108 QT Interval:  513 QTC Calculation: 581 R Axis:   8 Text Interpretation:  Sinus rhythm Prolonged PR interval LVH with secondary repolarization abnormality Inferior infarct, old Prolonged QT interval Artifact           Results for orders placed during the hospital encounter of 11/19/12  CBC WITH DIFFERENTIAL      Result Value Range   WBC 5.0  4.0 - 10.5 K/uL   RBC 3.10 (*) 3.87 - 5.11 MIL/uL   Hemoglobin 9.5 (*) 12.0 - 15.0 g/dL   HCT 16.1 (*) 09.6 - 04.5 %   MCV 86.8  78.0 - 100.0 fL   MCH 30.6  26.0 - 34.0 pg   MCHC 35.3  30.0 - 36.0 g/dL   RDW 40.9 (*) 81.1 - 91.4 %   Platelets 260  150 - 400 K/uL   Neutrophils Relative % 82 (*) 43 - 77 %   Neutro Abs 4.1  1.7 - 7.7 K/uL   Lymphocytes Relative 12  12 - 46 %   Lymphs Abs 0.6 (*) 0.7 - 4.0 K/uL   Monocytes Relative 5  3 - 12 %   Monocytes Absolute 0.2  0.1 - 1.0 K/uL   Eosinophils Relative 1  0 - 5 %   Eosinophils Absolute 0.1  0.0 - 0.7 K/uL   Basophils Relative 0  0 - 1 %   Basophils Absolute 0.0  0.0 - 0.1 K/uL  COMPREHENSIVE METABOLIC PANEL      Result Value Range   Sodium 128 (*) 135 - 145 mEq/L   Potassium 2.8 (*) 3.5 - 5.1 mEq/L   Chloride 86 (*) 96 - 112 mEq/L   CO2 29  19 - 32 mEq/L   Glucose, Bld 108 (*) 70 - 99 mg/dL   BUN 24 (*) 6 - 23 mg/dL   Creatinine, Ser 7.82 (*) 0.50 - 1.10 mg/dL   Calcium 8.8  8.4 - 95.6 mg/dL   Total Protein 6.1  6.0 - 8.3 g/dL   Albumin 2.8 (*) 3.5 - 5.2 g/dL   AST 21  0 - 37 U/L   ALT 14  0 - 35 U/L   Alkaline Phosphatase 99  39 - 117 U/L   Total Bilirubin 0.5  0.3 - 1.2 mg/dL   GFR calc non Af Amer 46 (*) >90 mL/min   GFR calc Af Amer 53 (*) >90 mL/min   URINALYSIS, ROUTINE W REFLEX MICROSCOPIC      Result Value Range   Color, Urine YELLOW  YELLOW   APPearance CLEAR  CLEAR   Specific Gravity, Urine 1.008  1.005 - 1.030   pH 7.0  5.0 - 8.0   Glucose, UA NEGATIVE  NEGATIVE mg/dL   Hgb urine dipstick NEGATIVE  NEGATIVE   Bilirubin Urine NEGATIVE  NEGATIVE   Ketones, ur NEGATIVE  NEGATIVE mg/dL   Protein, ur NEGATIVE  NEGATIVE mg/dL   Urobilinogen, UA 1.0  0.0 - 1.0 mg/dL   Nitrite NEGATIVE  NEGATIVE   Leukocytes, UA NEGATIVE  NEGATIVE  GLUCOSE, CAPILLARY      Result Value Range   Glucose-Capillary 97  70 - 99 mg/dL  TROPONIN I      Result Value Range   Troponin  I <0.30  <0.30 ng/mL  TROPONIN I      Result Value Range   Troponin I <0.30  <0.30 ng/mL  TROPONIN I      Result Value Range   Troponin I <0.30  <0.30 ng/mL  BASIC METABOLIC PANEL      Result Value Range   Sodium 130 (*) 135 - 145 mEq/L   Potassium 3.2 (*) 3.5 - 5.1 mEq/L   Chloride 88 (*) 96 - 112 mEq/L   CO2 34 (*) 19 - 32 mEq/L   Glucose, Bld 145 (*) 70 - 99 mg/dL   BUN 20  6 - 23 mg/dL   Creatinine, Ser 9.14  0.50 - 1.10 mg/dL   Calcium 9.1  8.4 - 78.2 mg/dL   GFR calc non Af Amer 57 (*) >90 mL/min   GFR calc Af Amer 66 (*) >90 mL/min  CBC      Result Value Range   WBC 6.3  4.0 - 10.5 K/uL   RBC 3.01 (*) 3.87 - 5.11 MIL/uL   Hemoglobin 9.2 (*) 12.0 - 15.0 g/dL   HCT 95.6 (*) 21.3 - 08.6 %   MCV 87.7  78.0 - 100.0 fL   MCH 30.6  26.0 - 34.0 pg   MCHC 34.8  30.0 - 36.0 g/dL   RDW 57.8  46.9 - 62.9 %   Platelets 295  150 - 400 K/uL  CREATININE, SERUM      Result Value Range   Creatinine, Ser 1.07  0.50 - 1.10 mg/dL   GFR calc non Af Amer 52 (*) >90 mL/min   GFR calc Af Amer 60 (*) >90 mL/min  GLUCOSE, CAPILLARY      Result Value Range   Glucose-Capillary 115 (*) 70 - 99 mg/dL  COMPREHENSIVE METABOLIC PANEL      Result Value Range   Sodium 129 (*) 135 - 145 mEq/L   Potassium 2.9 (*) 3.5 - 5.1 mEq/L   Chloride 89 (*) 96 - 112 mEq/L   CO2 34 (*) 19 -  32 mEq/L   Glucose, Bld 90  70 - 99 mg/dL   BUN 20  6 - 23 mg/dL   Creatinine, Ser 5.28  0.50 - 1.10 mg/dL   Calcium 8.6  8.4 - 41.3 mg/dL   Total Protein 5.5 (*) 6.0 - 8.3 g/dL   Albumin 2.5 (*) 3.5 - 5.2 g/dL   AST 16  0 - 37 U/L   ALT 11  0 - 35 U/L   Alkaline Phosphatase 87  39 - 117 U/L   Total Bilirubin 0.5  0.3 - 1.2 mg/dL   GFR calc non Af Amer 59 (*) >90 mL/min   GFR calc Af Amer 68 (*) >90 mL/min  CBC      Result Value Range   WBC 3.9 (*) 4.0 - 10.5 K/uL   RBC 2.88 (*) 3.87 - 5.11 MIL/uL   Hemoglobin 8.7 (*) 12.0 - 15.0 g/dL   HCT 24.4 (*) 01.0 - 27.2 %   MCV 87.2  78.0 - 100.0 fL   MCH 30.2  26.0 - 34.0 pg   MCHC 34.7  30.0 - 36.0 g/dL   RDW 53.6 (*) 64.4 - 03.4 %   Platelets 276  150 - 400 K/uL  MAGNESIUM      Result Value Range   Magnesium 1.4 (*) 1.5 - 2.5 mg/dL  GLUCOSE, CAPILLARY      Result Value Range   Glucose-Capillary 146 (*) 70 - 99 mg/dL  GLUCOSE,  CAPILLARY      Result Value Range   Glucose-Capillary 100 (*) 70 - 99 mg/dL  GLUCOSE, CAPILLARY      Result Value Range   Glucose-Capillary 120 (*) 70 - 99 mg/dL  TSH      Result Value Range   TSH 2.427  0.350 - 4.500 uIU/mL  T4, FREE      Result Value Range   Free T4 1.21  0.80 - 1.80 ng/dL  ANGIOTENSIN CONVERTING ENZYME      Result Value Range   Angiotensin-Converting Enzyme 40  8 - 52 U/L  GLUCOSE, CAPILLARY      Result Value Range   Glucose-Capillary 134 (*) 70 - 99 mg/dL  BASIC METABOLIC PANEL      Result Value Range   Sodium 131 (*) 135 - 145 mEq/L   Potassium 3.8  3.5 - 5.1 mEq/L   Chloride 88 (*) 96 - 112 mEq/L   CO2 33 (*) 19 - 32 mEq/L   Glucose, Bld 138 (*) 70 - 99 mg/dL   BUN 18  6 - 23 mg/dL   Creatinine, Ser 1.19  0.50 - 1.10 mg/dL   Calcium 9.1  8.4 - 14.7 mg/dL   GFR calc non Af Amer 50 (*) >90 mL/min   GFR calc Af Amer 58 (*) >90 mL/min  MAGNESIUM      Result Value Range   Magnesium 1.4 (*) 1.5 - 2.5 mg/dL  GLUCOSE, CAPILLARY      Result Value Range   Glucose-Capillary  127 (*) 70 - 99 mg/dL  GLUCOSE, CAPILLARY      Result Value Range   Glucose-Capillary 132 (*) 70 - 99 mg/dL  GLUCOSE, CAPILLARY      Result Value Range   Glucose-Capillary 130 (*) 70 - 99 mg/dL  GLUCOSE, CAPILLARY      Result Value Range   Glucose-Capillary 137 (*) 70 - 99 mg/dL  CBC      Result Value Range   WBC 4.2  4.0 - 10.5 K/uL   RBC 2.91 (*) 3.87 - 5.11 MIL/uL   Hemoglobin 8.8 (*) 12.0 - 15.0 g/dL   HCT 82.9 (*) 56.2 - 13.0 %   MCV 89.3  78.0 - 100.0 fL   MCH 30.2  26.0 - 34.0 pg   MCHC 33.8  30.0 - 36.0 g/dL   RDW 86.5 (*) 78.4 - 69.6 %   Platelets 278  150 - 400 K/uL  MAGNESIUM      Result Value Range   Magnesium 1.7  1.5 - 2.5 mg/dL  POTASSIUM      Result Value Range   Potassium 3.8  3.5 - 5.1 mEq/L  GLUCOSE, CAPILLARY      Result Value Range   Glucose-Capillary 110 (*) 70 - 99 mg/dL  GLUCOSE, CAPILLARY      Result Value Range   Glucose-Capillary 100 (*) 70 - 99 mg/dL  GLUCOSE, CAPILLARY      Result Value Range   Glucose-Capillary 190 (*) 70 - 99 mg/dL   Comment 1 Notify RN    GLUCOSE, CAPILLARY      Result Value Range   Glucose-Capillary 207 (*) 70 - 99 mg/dL   Comment 1 Notify RN     Comment 2 Documented in Chart    CBC      Result Value Range   WBC 4.6  4.0 - 10.5 K/uL   RBC 3.13 (*) 3.87 - 5.11 MIL/uL   Hemoglobin 9.3 (*) 12.0 - 15.0 g/dL  HCT 28.4 (*) 36.0 - 46.0 %   MCV 90.7  78.0 - 100.0 fL   MCH 29.7  26.0 - 34.0 pg   MCHC 32.7  30.0 - 36.0 g/dL   RDW 16.1 (*) 09.6 - 04.5 %   Platelets 330  150 - 400 K/uL  MAGNESIUM      Result Value Range   Magnesium 1.6  1.5 - 2.5 mg/dL  POTASSIUM      Result Value Range   Potassium 4.1  3.5 - 5.1 mEq/L  GLUCOSE, CAPILLARY      Result Value Range   Glucose-Capillary 158 (*) 70 - 99 mg/dL  GLUCOSE, CAPILLARY      Result Value Range   Glucose-Capillary 112 (*) 70 - 99 mg/dL  POCT I-STAT TROPONIN I      Result Value Range   Troponin i, poc 0.06  0.00 - 0.08 ng/mL   Comment 3            Dg Chest 2  View  11/19/2012   CLINICAL DATA:  Lost consciousness, left shoulder pain.  EXAM: CHEST  2 VIEW  COMPARISON:  08/01/2012  FINDINGS: The heart size is enlarged. There is bilateral interstitial thickening likely chronic. There is no focal consolidation or pneumothorax. There is a trace right pleural effusion versus scarring.  There is a comminuted fracture of the left surgical neck of the proximal humerus.  IMPRESSION: Comminuted fracture of the left surgical neck of the proximal humerus.  Cardiomegaly.   Electronically Signed   By: Elige Ko   On: 11/19/2012 10:21   Ct Head Wo Contrast  11/19/2012   CLINICAL DATA:  Syncopal episode.  Fall.  EXAM: CT HEAD WITHOUT CONTRAST  CT CERVICAL SPINE WITHOUT CONTRAST  TECHNIQUE: Multidetector CT imaging of the head and cervical spine was performed following the standard protocol without intravenous contrast. Multiplanar CT image reconstructions of the cervical spine were also generated.  COMPARISON:  05/20/2012 MR. 05/02/2011 CT head. 08/04/2005 CT head and cervical spine CT.  FINDINGS: CT HEAD FINDINGS  No skull fracture or intracranial hemorrhage.  Prominent small vessel disease type changes without CT evidence of large acute infarct.  No intracranial mass lesion noted on this unenhanced exam.  Mild atrophy without hydrocephalus.  Vascular calcifications  CT CERVICAL SPINE FINDINGS  Head tilt to the left with curvature of the cervical spine. Rotation of C1 upon C2. If there is a high clinical suspicion of ligamentous injury, flexion and extension views or MR can be performed for further delineation.  No cervical spine fracture noted.  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  Well corticated left C3-4 foraminal bony structures seen on prior exam and felt unlikely to be related to acute injury.  Lung apices are clear.  Prominent carotid bifurcation calcifications.  IMPRESSION: No skull fracture or intracranial  hemorrhage.  No cervical spine fracture.  Rotation of C1 upon C2 with head tilt to left as noted above.   Electronically Signed   By: Bridgett Larsson M.D.   On: 11/19/2012 10:43   Ct Cervical Spine Wo Contrast  11/19/2012   CLINICAL DATA:  Syncopal episode.  Fall.  EXAM: CT HEAD WITHOUT CONTRAST  CT CERVICAL SPINE WITHOUT CONTRAST  TECHNIQUE: Multidetector CT imaging of the head and cervical spine was performed following the standard protocol without intravenous contrast. Multiplanar CT image reconstructions of the cervical spine were also generated.  COMPARISON:  05/20/2012 MR. 05/02/2011 CT head. 08/04/2005 CT head  and cervical spine CT.  FINDINGS: CT HEAD FINDINGS  No skull fracture or intracranial hemorrhage.  Prominent small vessel disease type changes without CT evidence of large acute infarct.  No intracranial mass lesion noted on this unenhanced exam.  Mild atrophy without hydrocephalus.  Vascular calcifications  CT CERVICAL SPINE FINDINGS  Head tilt to the left with curvature of the cervical spine. Rotation of C1 upon C2. If there is a high clinical suspicion of ligamentous injury, flexion and extension views or MR can be performed for further delineation.  No cervical spine fracture noted.  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  Well corticated left C3-4 foraminal bony structures seen on prior exam and felt unlikely to be related to acute injury.  Lung apices are clear.  Prominent carotid bifurcation calcifications.  IMPRESSION: No skull fracture or intracranial hemorrhage.  No cervical spine fracture.  Rotation of C1 upon C2 with head tilt to left as noted above.   Electronically Signed   By: Bridgett Larsson M.D.   On: 11/19/2012 10:43   Ct Shoulder Left Wo Contrast  11/21/2012   CLINICAL DATA:  Shoulder pain and swelling after falling 4 days ago. Evaluate proximal humeral fracture.  EXAM: CT OF THE LEFT SHOULDER WITHOUT CONTRAST  TECHNIQUE:  Multidetector CT imaging was performed according to the standard protocol. Multiplanar CT image reconstructions were also generated.  COMPARISON:  Left shoulder radiographs 11/19/2012.  FINDINGS: Comminuted fracture of the left humeral neck has 3 major fragments and demonstrates persistent superior displacement by approximately 3 cm. There is also mild apex anterior and lateral angulation. This fracture involves the greater tuberosity. The lesser tuberosity and humeral head articular surface are intact.  The glenohumeral articulation appears preserved. There is no significant narrowing of the subacromial space. There is no evidence of scapular fracture. The left clavicle and acromioclavicular joint are intact. There are mild-to-moderate acromioclavicular degenerative changes.  There is edema within rotator cuff and deltoid musculature. There is an associated left shoulder joint effusion.  IMPRESSION: Stable comminuted fracture of the left humeral neck as described. This fracture demonstrates moderate superior displacement and mild angulation as described. No involvement of the humeral head articular surface or dislocation demonstrated.   Electronically Signed   By: Roxy Horseman M.D.   On: 11/21/2012 11:52   Dg Shoulder Left  11/19/2012   CLINICAL DATA:  Left shoulder pain, loss of consciousness.  EXAM: LEFT SHOULDER - 2+ VIEW  COMPARISON:  None.  FINDINGS: There is a comminuted fracture of the surgical neck of the left proximal humerus which also involves the greater tuberosity. There is apex anterior angulation. There is no glenohumeral dislocation. There are mild degenerative changes of the acromioclavicular joint.  IMPRESSION: Comminuted fracture of the surgical neck of the left proximal humerus.   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 This was found to be concordant by Dr. Edwin Cap. Pathology was relayed to the patient by telephone. The patient reported doing well after the biopsy with tenderness at the site. Post biopsy instructions were reviewed and her questions were answered. She was encouraged to call [facility] for any additional concerns. She was asked to return in 6 months for  diagnostic right mammography.  Pathology results were reported by Sonnie Alamo RN, BSN on November 08, 2012.   Electronically Signed   By: Jerene Dilling M.D.   On: 11/08/2012 10:03   11/11/2012   CLINICAL DATA:  Right breast calcifications.  EXAM: STEREOTACTIC VACUUM ASSIST RIGHT  COMPARISON:  Previous exams.  FINDINGS: I met with the patient and we discussed the procedure of stereotactic-guided biopsy, including benefits and alternatives. We discussed the high likelihood of a successful procedure. We discussed the risks of the procedure, including infection, bleeding, tissue injury, clip migration, and inadequate sampling. Informed, written consent was given. The usual time out protocol was performed immediately prior to the procedure.  Using sterile technique and 2% Lidocaine as local anesthetic, under stereotactic guidance, a 9 gauge Suros device was used to perform core needle biopsy of calcifications at 5 o'clock in the right breast using a medial to lateral approach. Specimen radiograph was performed, showing calcification. Specimens with calcifications are identified for pathology.  At the conclusion of the procedure, a dumbbell-shaped tissue marker clip was deployed into the biopsy cavity. Follow-up 2-view mammogram confirmed the clip to be in the appropriate position.  IMPRESSION: Stereotactic-guided biopsy of right breast calcifications. No apparent complications.  Electronically Signed: By: Jerene Dilling M.D. On: 11/07/2012 08:57     CRITICAL CARE Performed by: Shelda Jakes. Total critical care time: 30 Critical care time was exclusive of separately billable procedures and treating other patients. Critical care was necessary to treat or prevent imminent or life-threatening deterioration. Critical care was time spent personally by me on the following activities: development of treatment plan with patient and/or surrogate as well as nursing, discussions with consultants, evaluation of patient's response to treatment, examination of patient, obtaining history from patient or surrogate, ordering and performing treatments and interventions, ordering and review of laboratory studies, ordering and review of radiographic studies, pulse oximetry and re-evaluation of patient's condition.   The patient seen by me. Patient will require admission critical care time for potassium of 2.8 that was treated with IV and by mouth potassium with improvement. Patient also with comminuted fracture of her humerus that will require orthopedic consultation. Patient with a syncopal episode while on the way to the bathroom resulting in a fall resulting in the injuries. Patient will require monitoring for the syncopal episode. Fracture discussed with orthopedics.  Shelda Jakes, MD 11/23/12 [phone]

## 2012-11-23 NOTE — Progress Notes (Signed)
Patient ID: [name], female   DOB: 1945/02/03, 67 y.o.   MRN: [id number]    SUBJECTIVE:  The patient is stable and awaiting the life vest to be placed today   Filed Vitals:   11/22/12 1033 11/22/12 1350 11/22/12 2000 11/23/12 0509  BP:  146/83 137/89 127/75  Pulse:  93 93 85  Temp:  98.7 F (37.1 C) 98.5 F (36.9 C) 98.6 F (37 C)  TempSrc:  Oral Oral Oral  Resp:  18 18 18   Height:      Weight:    141 lb 12.1 oz (64.3 kg)  SpO2: 98% 95% 92% 97%     Intake/Output Summary (Last 24 hours) at 11/23/12 0902 Last data filed at 11/23/12 0500  Gross per 24 hour  Intake    480 ml  Output    900 ml  Net   -420 ml    LABS: Basic Metabolic Panel:  Recent Labs  40/98/11 0507 11/22/12 0550 11/23/12 0600  NA 131*  --   --   K 3.8 3.8 4.1  CL 88*  --   --   CO2 33*  --   --   GLUCOSE 138*  --   --   BUN 18  --   --   CREATININE 1.10  --   --   CALCIUM 9.1  --   --   MG 1.4* 1.7 1.6   Liver Function Tests: No results found for this basename: AST, ALT, ALKPHOS, BILITOT, PROT, ALBUMIN,  in the last 72 hours No results found for this basename: LIPASE, AMYLASE,  in the last 72 hours CBC:  Recent Labs  11/22/12 0550 11/23/12 0600  WBC 4.2 4.6  HGB 8.8* 9.3*  HCT 26.0* 28.4*  MCV 89.3 90.7  PLT 278 330   Cardiac Enzymes: No results found for this basename: CKTOTAL, CKMB, CKMBINDEX, TROPONINI,  in the last 72 hours BNP: No components found with this basename: POCBNP,  D-Dimer: No results found for this basename: DDIMER,  in the last 72 hours Hemoglobin A1C: No results found for this basename: HGBA1C,  in the last 72 hours Fasting Lipid Panel: No results found for this basename: CHOL, HDL, LDLCALC, TRIG, CHOLHDL, LDLDIRECT,  in the last 72 hours Thyroid Function Tests:  Recent Labs  11/20/12 1308  TSH 2.427    RADIOLOGY: Dg Chest 2 View  11/19/2012   CLINICAL DATA:  Lost consciousness, left shoulder pain.  EXAM: CHEST  2 VIEW  COMPARISON:  08/01/2012   FINDINGS: The heart size is enlarged. There is bilateral interstitial thickening likely chronic. There is no focal consolidation or pneumothorax. There is a trace right pleural effusion versus scarring.  There is a comminuted fracture of the left surgical neck of the proximal humerus.  IMPRESSION: Comminuted fracture of the left surgical neck of the proximal humerus.  Cardiomegaly.   Electronically Signed   By: Elige Ko   On: 11/19/2012 10:21   Ct Head Wo Contrast  11/19/2012   CLINICAL DATA:  Syncopal episode.  Fall.  EXAM: CT HEAD WITHOUT CONTRAST  CT CERVICAL SPINE WITHOUT CONTRAST  TECHNIQUE: Multidetector CT imaging of the head and cervical spine was performed following the standard protocol without intravenous contrast. Multiplanar CT image reconstructions of the cervical spine were also generated.  COMPARISON:  05/20/2012 MR. 05/02/2011 CT head. 08/04/2005 CT head and cervical spine CT.  FINDINGS: CT HEAD FINDINGS  No skull fracture or intracranial hemorrhage.  Prominent small vessel disease type changes  without CT evidence of large acute infarct.  No intracranial mass lesion noted on this unenhanced exam.  Mild atrophy without hydrocephalus.  Vascular calcifications  CT CERVICAL SPINE FINDINGS  Head tilt to the left with curvature of the cervical spine. Rotation of C1 upon C2. If there is a high clinical suspicion of ligamentous injury, flexion and extension views or MR can be performed for further delineation.  No cervical spine fracture noted.  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  Well corticated left C3-4 foraminal bony structures seen on prior exam and felt unlikely to be related to acute injury.  Lung apices are clear.  Prominent carotid bifurcation calcifications.  IMPRESSION: No skull fracture or intracranial hemorrhage.  No cervical spine fracture.  Rotation of C1 upon C2 with head tilt to left as noted above.   Electronically Signed    By: Bridgett Larsson M.D.   On: 11/19/2012 10:43   Ct Cervical Spine Wo Contrast  11/19/2012   CLINICAL DATA:  Syncopal episode.  Fall.  EXAM: CT HEAD WITHOUT CONTRAST  CT CERVICAL SPINE WITHOUT CONTRAST  TECHNIQUE: Multidetector CT imaging of the head and cervical spine was performed following the standard protocol without intravenous contrast. Multiplanar CT image reconstructions of the cervical spine were also generated.  COMPARISON:  05/20/2012 MR. 05/02/2011 CT head. 08/04/2005 CT head and cervical spine CT.  FINDINGS: CT HEAD FINDINGS  No skull fracture or intracranial hemorrhage.  Prominent small vessel disease type changes without CT evidence of large acute infarct.  No intracranial mass lesion noted on this unenhanced exam.  Mild atrophy without hydrocephalus.  Vascular calcifications  CT CERVICAL SPINE FINDINGS  Head tilt to the left with curvature of the cervical spine. Rotation of C1 upon C2. If there is a high clinical suspicion of ligamentous injury, flexion and extension views or MR can be performed for further delineation.  No cervical spine fracture noted.  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  Well corticated left C3-4 foraminal bony structures seen on prior exam and felt unlikely to be related to acute injury.  Lung apices are clear.  Prominent carotid bifurcation calcifications.  IMPRESSION: No skull fracture or intracranial hemorrhage.  No cervical spine fracture.  Rotation of C1 upon C2 with head tilt to left as noted above.   Electronically Signed   By: Bridgett Larsson M.D.   On: 11/19/2012 10:43   Ct Shoulder Left Wo Contrast  11/21/2012   CLINICAL DATA:  Shoulder pain and swelling after falling 4 days ago. Evaluate proximal humeral fracture.  EXAM: CT OF THE LEFT SHOULDER WITHOUT CONTRAST  TECHNIQUE: Multidetector CT imaging was performed according to the standard protocol. Multiplanar CT image reconstructions were also generated.   COMPARISON:  Left shoulder radiographs 11/19/2012.  FINDINGS: Comminuted fracture of the left humeral neck has 3 major fragments and demonstrates persistent superior displacement by approximately 3 cm. There is also mild apex anterior and lateral angulation. This fracture involves the greater tuberosity. The lesser tuberosity and humeral head articular surface are intact.  The glenohumeral articulation appears preserved. There is no significant narrowing of the subacromial space. There is no evidence of scapular fracture. The left clavicle and acromioclavicular joint are intact. There are mild-to-moderate acromioclavicular degenerative changes.  There is edema within rotator cuff and deltoid musculature. There is an associated left shoulder joint effusion.  IMPRESSION: Stable comminuted fracture of the left humeral neck as described. This fracture demonstrates moderate  superior displacement and mild angulation as described. No involvement of the humeral head articular surface or dislocation demonstrated.   Electronically Signed   By: Roxy Horseman M.D.   On: 11/21/2012 11:52   Dg Shoulder Left  11/19/2012   CLINICAL DATA:  Left shoulder pain, loss of consciousness.  EXAM: LEFT SHOULDER - 2+ VIEW  COMPARISON:  None.  FINDINGS: There is a comminuted fracture of the surgical neck of the left proximal humerus which also involves the greater tuberosity. There is apex anterior angulation. There is no glenohumeral dislocation. There are mild degenerative changes of the acromioclavicular joint.  IMPRESSION: Comminuted fracture of the surgical neck of the left proximal humerus.   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 This was found to be concordant by Dr. Edwin Cap. Pathology was  relayed to the patient by telephone. The patient reported doing well after the biopsy with tenderness at the site. Post biopsy instructions were reviewed and her questions were answered. She was encouraged to call [facility] for any additional concerns. She was asked to return in 6 months for diagnostic right mammography.  Pathology results were reported by Sonnie Alamo RN, BSN on November 08, 2012.   Electronically Signed   By: Jerene Dilling M.D.   On: 11/08/2012 10:03   11/11/2012   CLINICAL DATA:  Right breast calcifications.  EXAM: STEREOTACTIC VACUUM ASSIST RIGHT  COMPARISON:  Previous exams.  FINDINGS: I met with the patient and we discussed the procedure of stereotactic-guided biopsy, including benefits and alternatives. We discussed the high likelihood of a successful procedure. We discussed the risks of the procedure, including infection, bleeding, tissue injury, clip migration, and inadequate sampling. Informed, written consent was given. The usual time out protocol was performed immediately prior to the procedure.  Using sterile technique and 2% Lidocaine as local anesthetic, under stereotactic guidance, a 9 gauge Suros device was used to perform core needle biopsy of calcifications at 5 o'clock in the right breast using a medial to lateral approach. Specimen radiograph was performed, showing calcification. Specimens with calcifications are identified for pathology.  At the conclusion of the procedure, a dumbbell-shaped tissue marker clip was deployed into the biopsy cavity. Follow-up 2-view mammogram confirmed the clip to be in the appropriate position.  IMPRESSION: Stereotactic-guided biopsy of right breast calcifications. No apparent complications.  Electronically Signed: By: Jerene Dilling M.D. On: 11/07/2012 08:57    PHYSICAL EXAM   patient is stable with no significant chest pain.   TELEMETRY: I have reviewed telemetry today November 23, 2012. There is  normal sinus rhythm. There are rare PVCs.   ASSESSMENT AND PLAN:    Fracture of left humerus    Syncope    I have spoken with Dr. Graciela Husbands. The Life Vest is to be placed today. The patient can go home after this. The followup with Dr. Graciela Husbands has been arranged.  Willa Rough 11/23/2012 9:02 AM

## 2012-11-24 ENCOUNTER — Other Ambulatory Visit: Payer: Self-pay | Admitting: Pulmonary Disease

## 2012-11-25 ENCOUNTER — Other Ambulatory Visit: Payer: Medicare Other

## 2012-11-26 ENCOUNTER — Telehealth: Payer: Self-pay | Admitting: Cardiology

## 2012-11-26 NOTE — Telephone Encounter (Signed)
Will forward to Dr Antoine Poche for his knowledge and review.

## 2012-11-26 NOTE — Telephone Encounter (Signed)
New message    Pt request for Pam to call her back.  She would not tell me what she wanted.

## 2012-11-26 NOTE — Telephone Encounter (Signed)
Pt calling concerned that she has not been in the office for blood work.  In review of her chart she had a CBC, BMP 5 days ago in the hospital after a fall.  She c/o shoulder pain.  Pt reports her wt is down 7 lbs and she continues to weigh daily.

## 2012-11-26 NOTE — Telephone Encounter (Signed)
I don't see a follow up appt.  She can have this with one of the APPs.  Get a BMET next week.

## 2012-11-27 ENCOUNTER — Telehealth: Payer: Self-pay | Admitting: Cardiology

## 2012-11-27 ENCOUNTER — Telehealth: Payer: Self-pay | Admitting: *Deleted

## 2012-11-27 ENCOUNTER — Ambulatory Visit: Payer: Medicare Other | Admitting: Internal Medicine

## 2012-11-27 NOTE — Telephone Encounter (Signed)
Called patient to schedule CRT-D procedure in next few weeks - patient requested I call back Monday to set this up. I agreed to call her Monday. She also complains that her life vest is uncomfortable - I suggested she call the 800 number on her monitor to discuss what she can do to make it more comfortable. Patient agreeable to plans.

## 2012-11-27 NOTE — Telephone Encounter (Signed)
Left [employer] Dance a message with discharge date and spoke with patient in regards to her garmets  She is going to call the company and request for me to come out to her home

## 2012-11-27 NOTE — Telephone Encounter (Signed)
New message    Need to verify discharge date and if she was discharged to go home or another facility

## 2012-11-27 NOTE — Telephone Encounter (Signed)
Will forward to Anselm Pancoast to follow up.  Of note - I did speak with pt who states she is having problems with her LifeVest and would like to speak with Tresa Endo.

## 2012-11-29 ENCOUNTER — Emergency Department (HOSPITAL_COMMUNITY)

## 2012-11-29 ENCOUNTER — Encounter (HOSPITAL_COMMUNITY): Payer: Self-pay | Admitting: Emergency Medicine

## 2012-11-29 ENCOUNTER — Inpatient Hospital Stay (HOSPITAL_COMMUNITY)
Admission: EM | Admit: 2012-11-29 | Discharge: 2012-12-01 | DRG: 313 | Disposition: A | Discharge status: Hospice | Attending: Internal Medicine | Admitting: Internal Medicine

## 2012-11-29 DIAGNOSIS — S42302A Unspecified fracture of shaft of humerus, left arm, initial encounter for closed fracture: Secondary | ICD-10-CM

## 2012-11-29 DIAGNOSIS — E669 Obesity, unspecified: Secondary | ICD-10-CM | POA: Diagnosis present

## 2012-11-29 DIAGNOSIS — G4733 Obstructive sleep apnea (adult) (pediatric): Secondary | ICD-10-CM | POA: Diagnosis present

## 2012-11-29 DIAGNOSIS — M542 Cervicalgia: Secondary | ICD-10-CM

## 2012-11-29 DIAGNOSIS — Z87891 Personal history of nicotine dependence: Secondary | ICD-10-CM

## 2012-11-29 DIAGNOSIS — J449 Chronic obstructive pulmonary disease, unspecified: Secondary | ICD-10-CM | POA: Diagnosis present

## 2012-11-29 DIAGNOSIS — E876 Hypokalemia: Secondary | ICD-10-CM

## 2012-11-29 DIAGNOSIS — D72829 Elevated white blood cell count, unspecified: Secondary | ICD-10-CM | POA: Diagnosis present

## 2012-11-29 DIAGNOSIS — I509 Heart failure, unspecified: Secondary | ICD-10-CM

## 2012-11-29 DIAGNOSIS — R0902 Hypoxemia: Secondary | ICD-10-CM

## 2012-11-29 DIAGNOSIS — K2970 Gastritis, unspecified, without bleeding: Secondary | ICD-10-CM

## 2012-11-29 DIAGNOSIS — F0390 Unspecified dementia without behavioral disturbance: Secondary | ICD-10-CM

## 2012-11-29 DIAGNOSIS — E1129 Type 2 diabetes mellitus with other diabetic kidney complication: Secondary | ICD-10-CM

## 2012-11-29 DIAGNOSIS — I951 Orthostatic hypotension: Secondary | ICD-10-CM

## 2012-11-29 DIAGNOSIS — IMO0002 Reserved for concepts with insufficient information to code with codable children: Secondary | ICD-10-CM | POA: Diagnosis present

## 2012-11-29 DIAGNOSIS — E871 Hypo-osmolality and hyponatremia: Secondary | ICD-10-CM

## 2012-11-29 DIAGNOSIS — I440 Atrioventricular block, first degree: Secondary | ICD-10-CM

## 2012-11-29 DIAGNOSIS — I2510 Atherosclerotic heart disease of native coronary artery without angina pectoris: Secondary | ICD-10-CM | POA: Diagnosis present

## 2012-11-29 DIAGNOSIS — R079 Chest pain, unspecified: Secondary | ICD-10-CM | POA: Diagnosis present

## 2012-11-29 DIAGNOSIS — D509 Iron deficiency anemia, unspecified: Secondary | ICD-10-CM

## 2012-11-29 DIAGNOSIS — Z6379 Other stressful life events affecting family and household: Secondary | ICD-10-CM

## 2012-11-29 DIAGNOSIS — Z880 Allergy status to penicillin: Secondary | ICD-10-CM

## 2012-11-29 DIAGNOSIS — Z9861 Coronary angioplasty status: Secondary | ICD-10-CM

## 2012-11-29 DIAGNOSIS — K589 Irritable bowel syndrome without diarrhea: Secondary | ICD-10-CM

## 2012-11-29 DIAGNOSIS — Z823 Family history of stroke: Secondary | ICD-10-CM

## 2012-11-29 DIAGNOSIS — Z720 Tobacco use: Secondary | ICD-10-CM

## 2012-11-29 DIAGNOSIS — F411 Generalized anxiety disorder: Secondary | ICD-10-CM | POA: Diagnosis present

## 2012-11-29 DIAGNOSIS — M5412 Radiculopathy, cervical region: Secondary | ICD-10-CM

## 2012-11-29 DIAGNOSIS — Z8042 Family history of malignant neoplasm of prostate: Secondary | ICD-10-CM

## 2012-11-29 DIAGNOSIS — I252 Old myocardial infarction: Secondary | ICD-10-CM

## 2012-11-29 DIAGNOSIS — R55 Syncope and collapse: Secondary | ICD-10-CM | POA: Diagnosis present

## 2012-11-29 DIAGNOSIS — E785 Hyperlipidemia, unspecified: Secondary | ICD-10-CM | POA: Diagnosis present

## 2012-11-29 DIAGNOSIS — Z836 Family history of other diseases of the respiratory system: Secondary | ICD-10-CM

## 2012-11-29 DIAGNOSIS — R0789 Other chest pain: Principal | ICD-10-CM | POA: Diagnosis present

## 2012-11-29 DIAGNOSIS — Z79899 Other long term (current) drug therapy: Secondary | ICD-10-CM

## 2012-11-29 DIAGNOSIS — J4489 Other specified chronic obstructive pulmonary disease: Secondary | ICD-10-CM | POA: Diagnosis present

## 2012-11-29 DIAGNOSIS — Z9089 Acquired absence of other organs: Secondary | ICD-10-CM

## 2012-11-29 DIAGNOSIS — I5022 Chronic systolic (congestive) heart failure: Secondary | ICD-10-CM | POA: Diagnosis present

## 2012-11-29 DIAGNOSIS — Z803 Family history of malignant neoplasm of breast: Secondary | ICD-10-CM

## 2012-11-29 DIAGNOSIS — I10 Essential (primary) hypertension: Secondary | ICD-10-CM | POA: Diagnosis present

## 2012-11-29 DIAGNOSIS — K219 Gastro-esophageal reflux disease without esophagitis: Secondary | ICD-10-CM | POA: Diagnosis present

## 2012-11-29 DIAGNOSIS — M545 Low back pain: Secondary | ICD-10-CM

## 2012-11-29 DIAGNOSIS — Z888 Allergy status to other drugs, medicaments and biological substances status: Secondary | ICD-10-CM

## 2012-11-29 DIAGNOSIS — Z7982 Long term (current) use of aspirin: Secondary | ICD-10-CM

## 2012-11-29 DIAGNOSIS — K921 Melena: Secondary | ICD-10-CM

## 2012-11-29 DIAGNOSIS — Z800 Family history of malignant neoplasm of digestive organs: Secondary | ICD-10-CM

## 2012-11-29 DIAGNOSIS — F329 Major depressive disorder, single episode, unspecified: Secondary | ICD-10-CM | POA: Diagnosis present

## 2012-11-29 DIAGNOSIS — E119 Type 2 diabetes mellitus without complications: Secondary | ICD-10-CM | POA: Diagnosis present

## 2012-11-29 DIAGNOSIS — F3289 Other specified depressive episodes: Secondary | ICD-10-CM | POA: Diagnosis present

## 2012-11-29 LAB — CBC
HCT: 31.6 % — ABNORMAL LOW (ref 36.0–46.0)
MCH: 30.4 pg (ref 26.0–34.0)
MCHC: 34.5 g/dL (ref 30.0–36.0)
Platelets: 395 10*3/uL (ref 150–400)
RBC: 3.58 MIL/uL — ABNORMAL LOW (ref 3.87–5.11)
RDW: 16.5 % — ABNORMAL HIGH (ref 11.5–15.5)
WBC: 9.4 10*3/uL (ref 4.0–10.5)

## 2012-11-29 LAB — POCT I-STAT TROPONIN I: Troponin i, poc: 0.03 ng/mL (ref 0.00–0.08)

## 2012-11-29 LAB — PROTIME-INR
INR: 1.03 (ref 0.00–1.49)
Prothrombin Time: 13.3 s (ref 11.6–15.2)

## 2012-11-29 LAB — BASIC METABOLIC PANEL
BUN: 25 mg/dL — ABNORMAL HIGH (ref 6–23)
Chloride: 86 mEq/L — ABNORMAL LOW (ref 96–112)
GFR calc Af Amer: 46 mL/min — ABNORMAL LOW (ref >90)
GFR calc non Af Amer: 40 mL/min — ABNORMAL LOW (ref >90)
Potassium: 3.7 mEq/L (ref 3.5–5.1)
Sodium: 127 mEq/L — ABNORMAL LOW (ref 135–145)

## 2012-11-29 LAB — APTT: aPTT: 35 s (ref 24–37)

## 2012-11-29 LAB — PRO B NATRIURETIC PEPTIDE: Pro B Natriuretic peptide (BNP): 8012 pg/mL — ABNORMAL HIGH (ref 0–125)

## 2012-11-29 MED ORDER — SODIUM CHLORIDE 0.9 % IV SOLN
Freq: Once | INTRAVENOUS | Status: AC
  Administered 2012-11-29: 21:00:00 via INTRAVENOUS

## 2012-11-29 MED ORDER — FENTANYL CITRATE 0.05 MG/ML IJ SOLN
50.0000 ug | INTRAMUSCULAR | Status: DC | PRN
  Administered 2012-11-29: 50 ug via INTRAVENOUS
  Filled 2012-11-29: qty 2

## 2012-11-29 MED ORDER — IOHEXOL 350 MG/ML SOLN
80.0000 mL | Freq: Once | INTRAVENOUS | Status: AC | PRN
  Administered 2012-11-29: 80 mL via INTRAVENOUS

## 2012-11-29 NOTE — ED Notes (Signed)
Old and new EKG given to Dr Oletta Lamas

## 2012-11-29 NOTE — ED Provider Notes (Addendum)
CSN: [id number]     Arrival date & time 11/29/12  2019 History   First MD Initiated Contact with Patient 11/29/12 2021     Chief Complaint  Patient presents with  . Chest Pain   (Consider location/radiation/quality/duration/timing/severity/associated sxs/prior Treatment) Patient is a 67 y.o. female presenting with chest pain. The history is provided by the patient, the spouse and medical records.  Chest Pain Pain location:  R chest Pain radiates to:  Does not radiate Pain radiates to the back: no   Pain severity:  Moderate Onset quality:  Gradual Duration:  3 hours Timing:  Constant Progression:  Partially resolved Chronicity:  New Context: not breathing, not eating and no movement   Relieved by:  Rest and nitroglycerin Associated symptoms: no abdominal pain, no cough, no diaphoresis, no dizziness, no fever, no nausea and no near-syncope   Risk factors: coronary artery disease   Risk factors: not female and no prior DVT/PE     Past Medical History  Diagnosis Date  . DM   . DYSLIPIDEMIA   . OBESITY NOS   . DEPRESSION, RECURRENT, IN PARTIAL REMISSION   . HYPERTENSION   . CORONARY ARTERY DISEASE     a. s/p Q wave MI 2002 with RCA stent. b. Abnl nuc 04/2012 with subsequent cath showing patent stents in RCA, chronic diffuse disease in dLAD, 20% LM, EF 20%, no focal targets for PCI.  [name] Chronic systolic CHF (congestive heart failure)     a. Last EF 20% by cath 04/29/2012, 40-45% by echo 04/24/12.  [name] GERD   . DIVERTICULOSIS, COLON, WITH HEMORRHAGE   . BACK PAIN, LUMBAR, WITH RADICULOPATHY   . OBSTRUCTIVE SLEEP APNEA   . Anemia, iron deficiency   . Hyponatremia   . Tobacco abuse   . Moderate COPD (chronic obstructive pulmonary disease) 11/07/2005    Spirometry 03/09/11>>FEV1 2.02 (88%), FEV1% 76    . Anxiety   . Arthritis   . Myocardial infarction 2001  . Dementia     a. Saw neuro 08/2012: concerning for early dementia, labs unremarkable except elev CRP.  [name] Syncope     a. 04/2012:  2:1 heart block and 2nd degree AVB/Wenckebach. BB discontinued. b. Recurrence in 11/2012.  [name] Chronic edema   . Weight loss     CT abd pelvis 05/2012: mild small bowel disention. No significant acute changes. Mod-severe gastritis by EGD 06/06/2012 no clear neoplastic changes. Neg small bowel follow through except for diverticulum on 06/11/12.   . Gastritis     a. by EGD 05/2012.   Past Surgical History  Procedure Laterality Date  . Uvuloplasty    . Total abdominal hysterectomy w/ bilateral salpingoophorectomy    . Abdominal hysterectomy    . Cholecystectomy  2004  . Coronary angioplasty with stent placement  2002    last 04-29-12  . Esophagogastroduodenoscopy (egd) with propofol N/A 06/06/2012    Procedure: ESOPHAGOGASTRODUODENOSCOPY (EGD) WITH PROPOFOL;  Surgeon: Rachael Fee, MD;  Location: [facility];  Service: Endoscopy;  Laterality: N/A;   Family History  Problem Relation Age of Onset  . Stroke Mother   . Breast cancer Sister   . Emphysema Sister   . Cancer Sister     breast  . Alcohol abuse Brother   . Colon cancer      uncles x 2  . Prostate cancer Brother    History  Substance Use Topics  . Smoking status: Former Smoker -- 0.25 packs/day for 20 years    Types: Cigarettes  Quit date: 07/17/2012  . Smokeless tobacco: Never Used     Comment: Still smokes a little.  . Alcohol Use: No     Comment: Patient drinks cafinated daily.   OB History   Grav Para Term Preterm Abortions TAB SAB Ect Mult Living                 Review of Systems  Constitutional: Negative for fever and diaphoresis.  Respiratory: Negative for cough.   Cardiovascular: Positive for chest pain. Negative for near-syncope.  Gastrointestinal: Negative for nausea and abdominal pain.  Neurological: Negative for dizziness.  All other systems reviewed and are negative.    Allergies  Amlodipine; Ramipril; Adhesive; Morphine and related; Penicillins; and Codeine  Home Medications   Current  Outpatient Rx  Name  Route  Sig  Dispense  Refill  . acetaminophen (TYLENOL) 500 MG tablet   Oral   Take 500 mg by mouth every 6 (six) hours as needed for pain.         [name] albuterol (PROVENTIL) (2.5 MG/3ML) 0.083% nebulizer solution   Nebulization   Take 2.5 mg by nebulization every 6 (six) hours as needed for wheezing.         [name] aspirin 81 MG tablet   Oral   Take 81 mg by mouth daily.           [name] atorvastatin (LIPITOR) 40 MG tablet   Oral   Take 40 mg by mouth every morning.         . diclofenac sodium (VOLTAREN) 1 % GEL   Topical   Apply 2 g topically 4 (four) times daily.         . feeding supplement (GLUCERNA SHAKE) LIQD   Oral   Take 237 mLs by mouth as needed.          . furosemide (LASIX) 80 MG tablet   Oral   Take 160 mg by mouth 2 (two) times daily.         [name] gabapentin (NEURONTIN) 300 MG capsule   Oral   Take 1 capsule (300 mg total) by mouth 2 (two) times daily.   60 capsule   5   . hydrALAZINE (APRESOLINE) 50 MG tablet   Oral   Take 50 mg by mouth every 8 (eight) hours.         [name] HYDROcodone-acetaminophen (NORCO/VICODIN) 5-325 MG per tablet   Oral   Take 1-2 tablets by mouth every 4 (four) hours as needed for moderate pain.   40 tablet   0   . hydrOXYzine (ATARAX/VISTARIL) 10 MG tablet   Oral   Take 10 mg by mouth 2 (two) times daily.         . Iron-Folic Acid-B12-C-Docusate (FERRAPLUS 90) 90-1 MG TABS   Oral   Take 1 tablet by mouth daily.   90 tablet   3   . isosorbide mononitrate (IMDUR) 60 MG 24 hr tablet   Oral   Take 60 mg by mouth daily at 12 noon.         [name] losartan (COZAAR) 50 MG tablet   Oral   Take 1 tablet (50 mg total) by mouth daily.   90 tablet   3   . metolazone (ZAROXOLYN) 2.5 MG tablet   Oral   Take 2.5 mg by mouth 3 (three) times a week. Tuesdays Thursdays and sundays         . nitroGLYCERIN (NITROSTAT) 0.4 MG SL tablet   Sublingual  Place 1 tablet (0.4 mg total) under the tongue every 5  (five) minutes x 3 doses as needed for chest pain.   25 tablet   3   . omeprazole (PRILOSEC) 40 MG capsule   Oral   Take 40 mg by mouth daily.         . polyethylene glycol (MIRALAX / GLYCOLAX) packet   Oral   Take 17 g by mouth daily as needed (for constipation).         . potassium chloride (K-DUR,KLOR-CON) 10 MEQ tablet   Oral   Take 10-20 mEq by mouth See admin instructions. Take 3 tablets twice daily on days that Metolazone is taken. Take 2 tablets twice daily on other days         . sitaGLIPtan-metformin (JANUMET) 50-500 MG per tablet   Oral   Take 1 tablet by mouth 2 (two) times daily with a meal.         . spironolactone (ALDACTONE) 25 MG tablet   Oral   Take 1 tablet (25 mg total) by mouth daily.   30 tablet   6   . tiotropium (SPIRIVA) 18 MCG inhalation capsule   Inhalation   Place 18 mcg into inhaler and inhale daily.         . traMADol (ULTRAM) 50 MG tablet   Oral   Take 50-100 mg by mouth every 6 (six) hours as needed for moderate pain.         . traZODone (DESYREL) 100 MG tablet   Oral   Take 150 mg by mouth at bedtime.         [name] venlafaxine XR (EFFEXOR-XR) 150 MG 24 hr capsule   Oral   Take 150 mg by mouth every morning.         [employer] (SIMPLE DIAGNOSTICS LANCING DEV) MISC               . magnesium oxide (MAG-OX) 400 MG tablet   Oral   Take 1 tablet (400 mg total) by mouth daily.   30 tablet   0    BP 137/70  Pulse 90  Temp(Src) 99 F (37.2 C) (Oral)  Resp 20  SpO2 100% Physical Exam  Nursing note and vitals reviewed. Constitutional: She is oriented to person, place, and time. She appears well-developed and well-nourished.  HENT:  Head: Normocephalic and atraumatic.  Eyes: Conjunctivae and EOM are normal. No scleral icterus.  Neck: Normal range of motion. Neck supple.  Cardiovascular: Normal rate.   Pulmonary/Chest: Effort normal.  Abdominal: Soft. There is no tenderness. There is no rebound and no  guarding.  Musculoskeletal:  Left UE is in a sling due to known left humeral head fracture   Neurological: She is alert and oriented to person, place, and time. She has normal strength. No sensory deficit. She exhibits normal muscle tone. GCS eye subscore is 4. GCS verbal subscore is 5. GCS motor subscore is 6.  Skin: Skin is warm and dry. No rash noted. She is not diaphoretic.  Psychiatric: She has a normal mood and affect.    ED Course  Procedures (including critical care time) Labs Review Labs Reviewed  CBC - Abnormal; Notable for the following:    RBC 3.58 (*)    Hemoglobin 10.9 (*)    HCT 31.6 (*)    RDW 16.5 (*)    All other components within normal limits  BASIC METABOLIC PANEL - Abnormal; Notable for the following:  Sodium 127 (*)    Chloride 86 (*)    BUN 25 (*)    Creatinine, Ser 1.34 (*)    GFR calc non Af Amer 40 (*)    GFR calc Af Amer 46 (*)    All other components within normal limits  PRO B NATRIURETIC PEPTIDE - Abnormal; Notable for the following:    Pro B Natriuretic peptide (BNP) 8012.0 (*)    All other components within normal limits  APTT  PROTIME-INR  POCT I-STAT TROPONIN I   Imaging Review Ct Angio Chest W/cm &/or Wo Cm  11/29/2012   CLINICAL DATA:  Chest pain starting at 18 30 today. Chest pain goes and comes over the past week. Pain is located on the right anterior side of the chest. Nausea.  EXAM: CT ANGIOGRAPHY CHEST WITH CONTRAST  TECHNIQUE: Multidetector CT imaging of the chest was performed using the standard protocol during bolus administration of intravenous contrast. Multiplanar CT image reconstructions including MIPs were obtained to evaluate the vascular anatomy.  CONTRAST:  80mL OMNIPAQUE IOHEXOL 350 MG/ML SOLN  COMPARISON:  08/04/2005  FINDINGS: Technically adequate study with good opacification of the segmental and central pulmonary arteries. A no focal filling defects are demonstrated. No evidence of significant pulmonary embolus.  Mild  cardiac enlargement. Coronary artery calcifications. Normal caliber thoracic aorta with calcification. No significant lymphadenopathy in the chest. The esophagus is decompressed. Airways appear patent. No pleural effusions. There is atelectasis or infiltration in both lung bases. Interstitial changes in the lung bases could represent interstitial edema or fibrosis. No pneumothorax.  There is a comminuted fracture of the proximal left humerus with effusion or hematoma in the left shoulder. This was present on prior chest radiographs as well as on the CT of the left shoulder from 11/21/2012. Visualized portions of the upper abdominal organs demonstrate surgical absence of the gallbladder. No other focal abnormality is suggested.  Review of the MIP images confirms the above findings.  IMPRESSION: No evidence of significant pulmonary embolus. Cardiac enlargement with atelectasis or infiltration in the lung bases. Interstitial changes in the lungs may represent fibrosis or edema. Fracture of the left shoulder with surrounding edema or hematoma, previously evaluated on the prior studies.   Electronically Signed   By: Burman Nieves M.D.   On: 11/29/2012 23:13   Dg Chest Port 1 View  11/29/2012   CLINICAL DATA:  Chest pain and shortness of breath off an on all day. Current smoker.  EXAM: PORTABLE CHEST - 1 VIEW  COMPARISON:  11/19/2012  FINDINGS: Visualization is limited due to overlying electronic device is. The heart is enlarged. The pulmonary vascularity looks normal. No blunting of the right costophrenic angle. Left costophrenic angle is not included within the view. No focal airspace disease. No pneumothorax. Degenerative changes in the shoulders. Fracture deformity of the left shoulder as previously demonstrated.  IMPRESSION: Cardiac enlargement.  No evidence of active pulmonary disease.   Electronically Signed   By: Burman Nieves M.D.   On: 11/29/2012 21:16    EKG Interpretation   At time 20:46 shows SR  with first degree AV block at rate 93, normal axis, LVH with repol abn.  No longer appreciate inferior Q waves.  Abn ECG.        O2 sat on  O2 is 100% and I interpret to be adequate  11:13 PM CT images reviewed.  No large PE seen.  Bibasilar atelectasis or edema noted.  Pt reports CP is improved after IV fentanyl,  however pain is still present.  Although symptoms seem atypical, pt has known CAD with h/o stents in the past.  Will consult hospitalist for admission for rule out and likely cardiology consultation.  Pt had a cath in 04/2012 by Dr. Sanjuana Kava with no new significant blockages requiring intervention.     MDM   1. Chest pain   2. CHF (congestive heart failure)      Pt with right side CP, possibly mild pleuritic component to it. Given pt's recent syncope, hospitalization, arm fracture and relative immobility, PE needs to be excluded.  Will check Cr and consider PE study CT versus V/Q pending renal function.  Otherwise, will cycle troponin.  ECG shows no new acute ischemic changes.      Gavin Pound. Oletta Lamas, MD 11/29/12 [id number]  Gavin Pound. Oletta Lamas, MD 11/29/12 [id number]

## 2012-11-29 NOTE — ED Notes (Addendum)
Pt unable to have the CT angiogram at this time, radiology unable to visualize the lungs successfully with pt's holten monitor on.

## 2012-11-29 NOTE — ED Notes (Signed)
Pt requesting pain medication.  

## 2012-11-29 NOTE — ED Notes (Signed)
Pt states she took 2 nitroglycerin tablets before EMS picked her up, with this information pt has then had 4 nitroglycerin tablets. Pt states her pain has eased up a little.

## 2012-11-29 NOTE — ED Notes (Signed)
Pt presents to the department with chest pain, pt states the chest pain started at 1830 at home, pt states she took four baby aspirin at home, pt states the chest pain has come and gone for the past week, pt has sublingual nitroglycerin at home, pt states she did not take any at home today, EMS adm 2 nitroglycerin tablets, pt had no relief. 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 Pt states the pain in her chest is located in on her right anterior side of chest. Pt states she is nauseous. [name] paperwork that pt arrived with states the pt is a cancer pt and a hospice pt. Pt arrived to the department wearing a lifevest. Pt wears 2L 02 at home always. Pt also has a hx of diabetes. Pt is A&O X4. 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 All of this information is provided from EMS.

## 2012-11-29 NOTE — ED Notes (Signed)
MD at bedside. 

## 2012-11-29 NOTE — ED Notes (Signed)
MD informed of pt's request for pain medication.  

## 2012-11-29 NOTE — ED Notes (Signed)
MD informed this RN to remove pt's holten monitor per cardiology fellow so pt can have her CT angiogram. CT called and informed pt is ready to go for CT.

## 2012-11-30 ENCOUNTER — Encounter (HOSPITAL_COMMUNITY): Payer: Self-pay | Admitting: Internal Medicine

## 2012-11-30 DIAGNOSIS — R079 Chest pain, unspecified: Secondary | ICD-10-CM

## 2012-11-30 DIAGNOSIS — I2510 Atherosclerotic heart disease of native coronary artery without angina pectoris: Secondary | ICD-10-CM

## 2012-11-30 DIAGNOSIS — R0789 Other chest pain: Principal | ICD-10-CM | POA: Diagnosis present

## 2012-11-30 DIAGNOSIS — F0390 Unspecified dementia without behavioral disturbance: Secondary | ICD-10-CM

## 2012-11-30 DIAGNOSIS — I509 Heart failure, unspecified: Secondary | ICD-10-CM

## 2012-11-30 DIAGNOSIS — I5022 Chronic systolic (congestive) heart failure: Secondary | ICD-10-CM

## 2012-11-30 DIAGNOSIS — I440 Atrioventricular block, first degree: Secondary | ICD-10-CM

## 2012-11-30 LAB — GLUCOSE, CAPILLARY
Glucose-Capillary: 103 mg/dL — ABNORMAL HIGH (ref 70–99)
Glucose-Capillary: 84 mg/dL (ref 70–99)
Glucose-Capillary: 100 mg/dL — ABNORMAL HIGH (ref 70–99)
Glucose-Capillary: 112 mg/dL — ABNORMAL HIGH (ref 70–99)

## 2012-11-30 LAB — CBC
HCT: 29 % — ABNORMAL LOW (ref 36.0–46.0)
Hemoglobin: 9.9 g/dL — ABNORMAL LOW (ref 12.0–15.0)
MCH: 30.4 pg (ref 26.0–34.0)
MCV: 89 fL (ref 78.0–100.0)
Platelets: 352 10*3/uL (ref 150–400)
RBC: 3.26 MIL/uL — ABNORMAL LOW (ref 3.87–5.11)

## 2012-11-30 LAB — URINE MICROSCOPIC-ADD ON

## 2012-11-30 LAB — URINALYSIS, ROUTINE W REFLEX MICROSCOPIC
Bilirubin Urine: NEGATIVE
Glucose, UA: NEGATIVE mg/dL
Hgb urine dipstick: NEGATIVE
Ketones, ur: NEGATIVE mg/dL
Nitrite: NEGATIVE
Protein, ur: NEGATIVE mg/dL
Specific Gravity, Urine: 1.013 (ref 1.005–1.030)
Urobilinogen, UA: 0.2 mg/dL (ref 0.0–1.0)
pH: 6 (ref 5.0–8.0)

## 2012-11-30 LAB — TROPONIN I
Troponin I: <0.3 ng/mL (ref <0.30)
Troponin I: <0.3 ng/mL (ref <0.30)
Troponin I: <0.3 ng/mL (ref <0.30)
Troponin I: <0.3 ng/mL (ref <0.30)

## 2012-11-30 LAB — HEMOGLOBIN A1C
Hgb A1c MFr Bld: 6.5 % — ABNORMAL HIGH (ref <5.7)
Mean Plasma Glucose: 140 mg/dL — ABNORMAL HIGH (ref <117)

## 2012-11-30 MED ORDER — POTASSIUM CHLORIDE CRYS ER 10 MEQ PO TBCR: 10.0000 meq | EXTENDED_RELEASE_TABLET | ORAL | Status: DC

## 2012-11-30 MED ORDER — FERROUS SULFATE 325 (65 FE) MG PO TABS
325.0000 mg | ORAL_TABLET | Freq: Every day | ORAL | Status: DC
  Administered 2012-11-30 – 2012-12-01 (×2): 325 mg via ORAL
  Filled 2012-11-30 (×3): qty 1

## 2012-11-30 MED ORDER — LOSARTAN POTASSIUM 50 MG PO TABS
50.0000 mg | ORAL_TABLET | Freq: Every day | ORAL | Status: DC
  Administered 2012-11-30 – 2012-12-01 (×2): 50 mg via ORAL
  Filled 2012-11-30 (×2): qty 1

## 2012-11-30 MED ORDER — FERRAPLUS 90 90-1 MG PO TABS: 1.0000 | ORAL_TABLET | Freq: Every day | ORAL | Status: DC

## 2012-11-30 MED ORDER — SITAGLIPTIN PHOS-METFORMIN HCL 50-500 MG PO TABS: 1.0000 | ORAL_TABLET | Freq: Two times a day (BID) | ORAL | Status: DC

## 2012-11-30 MED ORDER — ATORVASTATIN CALCIUM 40 MG PO TABS
40.0000 mg | ORAL_TABLET | Freq: Every morning | ORAL | Status: DC
  Administered 2012-11-30 – 2012-12-01 (×2): 40 mg via ORAL
  Filled 2012-11-30 (×2): qty 1

## 2012-11-30 MED ORDER — INSULIN ASPART 100 UNIT/ML ~~LOC~~ SOLN: 0.0000 [IU] | Freq: Three times a day (TID) | SUBCUTANEOUS | Status: DC

## 2012-11-30 MED ORDER — TRAMADOL HCL 50 MG PO TABS
50.0000 mg | ORAL_TABLET | Freq: Four times a day (QID) | ORAL | Status: DC | PRN
  Administered 2012-11-30 – 2012-12-01 (×2): 100 mg via ORAL
  Filled 2012-11-30 (×2): qty 2

## 2012-11-30 MED ORDER — POTASSIUM CHLORIDE CRYS ER 10 MEQ PO TBCR
30.0000 meq | EXTENDED_RELEASE_TABLET | ORAL | Status: DC
  Administered 2012-12-01: 30 meq via ORAL
  Filled 2012-11-30 (×2): qty 3

## 2012-11-30 MED ORDER — POTASSIUM CHLORIDE CRYS ER 10 MEQ PO TBCR
20.0000 meq | EXTENDED_RELEASE_TABLET | ORAL | Status: DC
  Administered 2012-11-30 (×2): 20 meq via ORAL
  Filled 2012-11-30 (×2): qty 2

## 2012-11-30 MED ORDER — LINAGLIPTIN 5 MG PO TABS
5.0000 mg | ORAL_TABLET | Freq: Every day | ORAL | Status: DC
  Administered 2012-11-30 – 2012-12-01 (×2): 5 mg via ORAL
  Filled 2012-11-30 (×3): qty 1

## 2012-11-30 MED ORDER — GABAPENTIN 300 MG PO CAPS
300.0000 mg | ORAL_CAPSULE | Freq: Two times a day (BID) | ORAL | Status: DC
  Administered 2012-11-30 – 2012-12-01 (×4): 300 mg via ORAL
  Filled 2012-11-30 (×5): qty 1

## 2012-11-30 MED ORDER — MAGNESIUM OXIDE 400 (241.3 MG) MG PO TABS
400.0000 mg | ORAL_TABLET | Freq: Every day | ORAL | Status: DC
  Administered 2012-11-30 – 2012-12-01 (×2): 400 mg via ORAL
  Filled 2012-11-30 (×2): qty 1

## 2012-11-30 MED ORDER — ACETAMINOPHEN 500 MG PO TABS: 500.0000 mg | ORAL_TABLET | Freq: Four times a day (QID) | ORAL | Status: DC | PRN

## 2012-11-30 MED ORDER — PANTOPRAZOLE SODIUM 40 MG PO TBEC
40.0000 mg | DELAYED_RELEASE_TABLET | Freq: Every day | ORAL | Status: DC
  Administered 2012-11-30 – 2012-12-01 (×2): 40 mg via ORAL
  Filled 2012-11-30 (×2): qty 1

## 2012-11-30 MED ORDER — INSULIN ASPART 100 UNIT/ML ~~LOC~~ SOLN: 0.0000 [IU] | Freq: Every day | SUBCUTANEOUS | Status: DC

## 2012-11-30 MED ORDER — SPIRONOLACTONE 25 MG PO TABS
25.0000 mg | ORAL_TABLET | Freq: Every day | ORAL | Status: DC
  Administered 2012-11-30 – 2012-12-01 (×2): 25 mg via ORAL
  Filled 2012-11-30 (×2): qty 1

## 2012-11-30 MED ORDER — ISOSORBIDE MONONITRATE ER 60 MG PO TB24
60.0000 mg | ORAL_TABLET | Freq: Every day | ORAL | Status: DC
  Administered 2012-11-30: 60 mg via ORAL
  Filled 2012-11-30 (×2): qty 1

## 2012-11-30 MED ORDER — TIOTROPIUM BROMIDE MONOHYDRATE 18 MCG IN CAPS
18.0000 ug | ORAL_CAPSULE | Freq: Every day | RESPIRATORY_TRACT | Status: DC
  Administered 2012-11-30 – 2012-12-01 (×2): 18 ug via RESPIRATORY_TRACT
  Filled 2012-11-30: qty 5

## 2012-11-30 MED ORDER — HYDROCODONE-ACETAMINOPHEN 5-325 MG PO TABS
1.0000 | ORAL_TABLET | ORAL | Status: DC | PRN
  Administered 2012-11-30 – 2012-12-01 (×5): 2 via ORAL
  Filled 2012-11-30 (×5): qty 2

## 2012-11-30 MED ORDER — METOLAZONE 2.5 MG PO TABS: 2.5000 mg | ORAL_TABLET | ORAL | Status: DC

## 2012-11-30 MED ORDER — ALBUTEROL SULFATE (5 MG/ML) 0.5% IN NEBU: 2.5000 mg | INHALATION_SOLUTION | RESPIRATORY_TRACT | Status: DC | PRN

## 2012-11-30 MED ORDER — GLUCERNA SHAKE PO LIQD
237.0000 mL | Freq: Three times a day (TID) | ORAL | Status: DC
  Administered 2012-11-30 (×3): 237 mL via ORAL

## 2012-11-30 MED ORDER — FUROSEMIDE 80 MG PO TABS
160.0000 mg | ORAL_TABLET | Freq: Two times a day (BID) | ORAL | Status: DC
  Administered 2012-11-30 – 2012-12-01 (×3): 160 mg via ORAL
  Filled 2012-11-30 (×5): qty 2

## 2012-11-30 MED ORDER — POLYETHYLENE GLYCOL 3350 17 G PO PACK
17.0000 g | PACK | Freq: Every day | ORAL | Status: DC | PRN
  Filled 2012-11-30: qty 1

## 2012-11-30 MED ORDER — TRAZODONE HCL 150 MG PO TABS
150.0000 mg | ORAL_TABLET | Freq: Every day | ORAL | Status: DC
  Administered 2012-11-30 (×2): 150 mg via ORAL
  Filled 2012-11-30 (×3): qty 1

## 2012-11-30 MED ORDER — METFORMIN HCL 500 MG PO TABS
500.0000 mg | ORAL_TABLET | Freq: Two times a day (BID) | ORAL | Status: DC
  Administered 2012-11-30 – 2012-12-01 (×3): 500 mg via ORAL
  Filled 2012-11-30 (×5): qty 1

## 2012-11-30 MED ORDER — NITROGLYCERIN 0.4 MG SL SUBL: 0.4000 mg | SUBLINGUAL_TABLET | SUBLINGUAL | Status: DC | PRN

## 2012-11-30 MED ORDER — HYDRALAZINE HCL 50 MG PO TABS
50.0000 mg | ORAL_TABLET | Freq: Three times a day (TID) | ORAL | Status: DC
  Administered 2012-11-30 – 2012-12-01 (×4): 50 mg via ORAL
  Filled 2012-11-30 (×7): qty 1

## 2012-11-30 MED ORDER — DOCUSATE SODIUM 100 MG PO CAPS
100.0000 mg | ORAL_CAPSULE | Freq: Every day | ORAL | Status: DC
  Administered 2012-11-30 – 2012-12-01 (×2): 100 mg via ORAL
  Filled 2012-11-30 (×3): qty 1

## 2012-11-30 MED ORDER — VENLAFAXINE HCL ER 150 MG PO CP24
150.0000 mg | ORAL_CAPSULE | Freq: Every morning | ORAL | Status: DC
  Administered 2012-11-30 – 2012-12-01 (×2): 150 mg via ORAL
  Filled 2012-11-30 (×2): qty 1

## 2012-11-30 MED ORDER — HYDROXYZINE HCL 10 MG PO TABS
10.0000 mg | ORAL_TABLET | Freq: Two times a day (BID) | ORAL | Status: DC
  Administered 2012-11-30 – 2012-12-01 (×4): 10 mg via ORAL
  Filled 2012-11-30 (×6): qty 1

## 2012-11-30 MED ORDER — ASPIRIN EC 81 MG PO TBEC
81.0000 mg | DELAYED_RELEASE_TABLET | Freq: Every day | ORAL | Status: DC
  Administered 2012-11-30 – 2012-12-01 (×2): 81 mg via ORAL
  Filled 2012-11-30 (×3): qty 1

## 2012-11-30 MED ORDER — HEPARIN SODIUM (PORCINE) 5000 UNIT/ML IJ SOLN
5000.0000 [IU] | Freq: Three times a day (TID) | INTRAMUSCULAR | Status: DC
  Administered 2012-11-30 – 2012-12-01 (×3): 5000 [IU] via SUBCUTANEOUS
  Filled 2012-11-30 (×6): qty 1

## 2012-11-30 MED ORDER — INSULIN ASPART 100 UNIT/ML ~~LOC~~ SOLN: 0.0000 [IU] | SUBCUTANEOUS | Status: DC

## 2012-11-30 MED ORDER — FOLIC ACID 1 MG PO TABS
1.0000 mg | ORAL_TABLET | Freq: Every day | ORAL | Status: DC
  Administered 2012-11-30 – 2012-12-01 (×2): 1 mg via ORAL
  Filled 2012-11-30 (×3): qty 1

## 2012-11-30 MED ORDER — SODIUM CHLORIDE 0.9 % IJ SOLN
3.0000 mL | Freq: Two times a day (BID) | INTRAMUSCULAR | Status: DC
  Administered 2012-11-30 (×2): 3 mL via INTRAVENOUS

## 2012-11-30 NOTE — Progress Notes (Signed)
Utilization Review Completed.   Orton Capell, RN, BSN Nurse Case Manager  

## 2012-11-30 NOTE — Progress Notes (Signed)
MD made aware of elevated WBC.  Pt. Is afebrile, VS stable.  No new orders received.  Will continue to monitor pt.

## 2012-11-30 NOTE — Progress Notes (Signed)
The patient was seen and examined, admitted by Dr. Conley Rolls this morning  Briefly 67 year old female with history of CAD, ICM EF 20%, prev. MI/RCA stents in 2002 who was recently admitted 11/18-11/22 a syncopal episode resulting in left shoulder/humerus fracture. Cardiology was consulted and patient had life vest placed. EF 20%, plan was for ICD once she has recovered from her shoulder fracture. Patient returned back this morning with chest pain.  At that time of my examination chest pain has resolved  BP 119/64  Pulse 68  Temp(Src) 97.9 F (36.6 C) (Oral)  Resp 18  Ht 5\' 9"  (1.753 m)  Wt 63 kg (138 lb 14.2 oz)  BMI 20.50 kg/m2  SpO2 96%  Gen.: Alert and oriented CVS: S1-S2 clear, 2/ 6 murmur Lungs: Clear to auscultation Abdomen: Soft nontender nondistended normal bowel sounds Extremities: No cyanosis, clubbing or edema noted Neuro: No FND  CBC WBC 19.2 hemoglobin 9.9, hematocrit 29.0 platelets 350  Chest pain: Likely musculoskeletal With extensive cardiac history, called cardiology consult, recommended continuing life vest, followup for [name] in Dr. Graciela Husbands, followup BMET. Not on beta blocker due to Mobitz type I  Leukocytosis: No acute signs of infection, follow CBC, blood cultures and urine cultures.  - Follow CBC in a.m.  Left shoulder fracture: Outpatient follow up with Dr. Ranell Patrick, continue sling PT evaluation  Possible DC home in a.m. if remains stable, no fevers leukocytosis improving. Outpatient followup with orthopedics and cardiology.   Derek Huneycutt M.D. [facility] 11/30/2012, 10:50 AM  Pager: [phone]

## 2012-11-30 NOTE — Consult Note (Addendum)
CARDIOLOGY CONSULT NOTE   Patient ID: ALLYSSA ABRUZZESE MRN: [id number] DOB/AGE: Nov 10, 1945 67 y.o.  Admit date: 11/29/2012  Primary Physician   [name], MD Primary Cardiologist    JH/SK Reason for Consultation   Chest pain  [id number] B Demary is a 67 y.o. female with a history of CAD, ICM EF 20% & 2+ MR, prev. MI/RCA stents in 2002  Admit 04/2012 w/ syncope, event monitor with 2:1 AVB, HR 35. BB d/c'd. Had chest pain then as well, cath w/ 99% PDA, o/w non-obstructive dz, patent RCA stents, medical Rx.   Admit 11/18->22 w/ syncope and left shoulder/humerus fx, hypokalemia, hypomagnesemia, hyponatremia, positive orthostatics. Seen by SK that admission. With her LVD, repeat echo and possible ICD (if her EF did not improve) were planned after she recovered. Otherwise, loop recorder is reasonable.   Since discharge, she has had a great deal of shoulder and arm pain, but has otherwise been doing well. She has been ambulating with a cane. She has had no chest pain or shortness of breath. Yesterday, while sitting still, she had onset of upper abdominal and lower chest pain across her chest. It was a tightness and squeezing. It was worse with deep inspiration. It was initially mild but increased to an 8/10. When the pain got severe it hurts so much to breathe she could barely take a breath. She became presyncopal at that time but did not pass out. She did not take any medications for the pain. She came to the emergency room and received fentanyl 50 mcg. A chest CT was performed that showed no acute abnormality, results are below. Her chest pain resolved. Currently, she has a small amount of pain with deep inspiration or chest wall palpation, but is otherwise resting comfortably except for her arm and shoulder pain which she has had since the surgery.   Past Medical History  Diagnosis Date  . DM   . DYSLIPIDEMIA   . OBESITY NOS   . DEPRESSION, RECURRENT, IN PARTIAL REMISSION   .  HYPERTENSION   . CORONARY ARTERY DISEASE     a. s/p Q wave MI 2002 with RCA stent x 2. b. Abnl nuc 04/2012 with subsequent cath showing patent stents in RCA, chronic diffuse disease in dLAD, 20% LM, EF 20%, no focal targets for PCI.  [name] Chronic systolic CHF (congestive heart failure)     a. Last EF 20% by cath 04/29/2012, 40-45% by echo 04/24/12.  [name] GERD   . DIVERTICULOSIS, COLON, WITH HEMORRHAGE   . BACK PAIN, LUMBAR, WITH RADICULOPATHY   . OBSTRUCTIVE SLEEP APNEA   . Anemia, iron deficiency   . Hyponatremia   . Tobacco abuse   . Moderate COPD (chronic obstructive pulmonary disease) 11/07/2005    Spirometry 03/09/11>>FEV1 2.02 (88%), FEV1% 76    . Anxiety   . Arthritis   . Myocardial infarction 2001  . Dementia     a. Saw neuro 08/2012: concerning for early dementia, labs unremarkable except elev CRP.  [name] Syncope     a. 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 BB discontinued. b. Recurrence in 11/2012.  [name] Chronic edema   . Weight loss     CT abd pelvis 05/2012: mild small bowel disention. No significant acute changes. Mod-severe gastritis by EGD 06/06/2012 no clear neoplastic changes. Neg small bowel follow through except for diverticulum on 06/11/12.   . Gastritis     a. by EGD 05/2012.     Past  Surgical History  Procedure Laterality Date  . Uvuloplasty    . Total abdominal hysterectomy w/ bilateral salpingoophorectomy    . Abdominal hysterectomy    . Cholecystectomy  2004  . Coronary angioplasty with stent placement  2002    Thrombectomy; prox RCA 3.5 x 15 mm NIR stent; mid RCA PTCA; distal RCA 3.5 x 15 mm NIR stent;     . Esophagogastroduodenoscopy (egd) with propofol N/A 06/06/2012    Procedure: ESOPHAGOGASTRODUODENOSCOPY (EGD) WITH PROPOFOL;  Surgeon: Rachael Fee, MD;  Location: [facility];  Service: Endoscopy;  Laterality: N/A;    Allergies  Allergen Reactions  . Amlodipine Other (See Comments)    constipation  . Ramipril Cough  . Adhesive [Tape] Hives     "peels skin off"  . Morphine And Related Other (See Comments)    Per Husband, Pt has hallucinations  . Penicillins Itching  . Codeine Rash    I have reviewed the patient's current medications . aspirin EC  81 mg Oral Daily  . atorvastatin  40 mg Oral q morning - 10a  . ferrous sulfate  325 mg Oral Q breakfast   And  . folic acid  1 mg Oral Q breakfast   And  . docusate sodium  100 mg Oral Q breakfast  . feeding supplement (GLUCERNA SHAKE)  237 mL Oral TID WC  . furosemide  160 mg Oral BID  . gabapentin  300 mg Oral BID  . heparin  5,000 Units Subcutaneous Q8H  . hydrALAZINE  50 mg Oral Q8H  . hydrOXYzine  10 mg Oral BID  . insulin aspart  0-15 Units Subcutaneous Q4H  . isosorbide mononitrate  60 mg Oral Q1200  . linagliptin  5 mg Oral Q breakfast   And  . metFORMIN  500 mg Oral BID WC  . losartan  50 mg Oral Daily  . magnesium oxide  400 mg Oral Daily  . [START ON 12/01/2012] metolazone  2.5 mg Oral 3 times weekly  . pantoprazole  40 mg Oral Daily  . potassium chloride  20 mEq Oral Custom   And  . [START ON 12/01/2012] potassium chloride  30 mEq Oral Custom  . sodium chloride  3 mL Intravenous Q12H  . spironolactone  25 mg Oral Daily  . tiotropium  18 mcg Inhalation Daily  . traZODone  150 mg Oral QHS  . venlafaxine XR  150 mg Oral q morning - 10a     acetaminophen, albuterol, fentaNYL, HYDROcodone-acetaminophen, nitroGLYCERIN, polyethylene glycol, traMADol  Prior to Admission medications   Medication Sig Start Date End Date Taking? Authorizing Provider  acetaminophen (TYLENOL) 500 MG tablet Take 500 mg by mouth every 6 (six) hours as needed for pain.   Yes Historical Provider, MD  albuterol (PROVENTIL) (2.5 MG/3ML) 0.083% nebulizer solution Take 2.5 mg by nebulization every 6 (six) hours as needed for wheezing.   Yes Historical Provider, MD  aspirin 81 MG tablet Take 81 mg by mouth daily.     Yes Historical Provider, MD  atorvastatin (LIPITOR) 40 MG tablet Take 40 mg  by mouth every morning.   Yes Historical Provider, MD  diclofenac sodium (VOLTAREN) 1 % GEL Apply 2 g topically 4 (four) times daily. 06/12/12  Yes Karen Prueter, PA-C  feeding supplement (GLUCERNA SHAKE) LIQD Take 237 mLs by mouth as needed.    Yes Historical Provider, MD  furosemide (LASIX) 80 MG tablet Take 160 mg by mouth 2 (two) times daily.   Yes Historical Provider, MD  gabapentin (NEURONTIN) 300 MG capsule Take 1 capsule (300 mg total) by mouth 2 (two) times daily. 06/12/12  Yes Clydie Braun Prueter, PA-C  hydrALAZINE (APRESOLINE) 50 MG tablet Take 50 mg by mouth every 8 (eight) hours.   Yes Historical Provider, MD  HYDROcodone-acetaminophen (NORCO/VICODIN) 5-325 MG per tablet Take 1-2 tablets by mouth every 4 (four) hours as needed for moderate pain. 11/22/12  Yes Leroy Sea, MD  hydrOXYzine (ATARAX/VISTARIL) 10 MG tablet Take 10 mg by mouth 2 (two) times daily.   Yes Historical Provider, MD  Iron-Folic Acid-B12-C-Docusate (FERRAPLUS 90) 90-1 MG TABS Take 1 tablet by mouth daily. 10/30/12  Yes [name], MD  isosorbide mononitrate (IMDUR) 60 MG 24 hr tablet Take 60 mg by mouth daily at 12 noon.   Yes Historical Provider, MD  losartan (COZAAR) 50 MG tablet Take 1 tablet (50 mg total) by mouth daily. 10/30/12  Yes [name], MD  metolazone (ZAROXOLYN) 2.5 MG tablet Take 2.5 mg by mouth 3 (three) times a week. Tuesdays Thursdays and sundays 09/04/12  Yes Dyann Kief, PA-C  nitroGLYCERIN (NITROSTAT) 0.4 MG SL tablet Place 1 tablet (0.4 mg total) under the tongue every 5 (five) minutes x 3 doses as needed for chest pain. 04/30/12  Yes Rhonda G Barrett, PA-C  omeprazole (PRILOSEC) 40 MG capsule Take 40 mg by mouth daily.   Yes Historical Provider, MD  polyethylene glycol (MIRALAX / GLYCOLAX) packet Take 17 g by mouth daily as needed (for constipation).   Yes Historical Provider, MD  potassium chloride (K-DUR,KLOR-CON) 10 MEQ tablet Take 10-20 mEq by mouth See admin instructions. Take 3  tablets twice daily on days that Metolazone is taken. Take 2 tablets twice daily on other days   Yes Historical Provider, MD  sitaGLIPtan-metformin (JANUMET) 50-500 MG per tablet Take 1 tablet by mouth 2 (two) times daily with a meal.   Yes Historical Provider, MD  spironolactone (ALDACTONE) 25 MG tablet Take 1 tablet (25 mg total) by mouth daily. 11/18/12  Yes Rosalio Macadamia, NP  tiotropium (SPIRIVA) 18 MCG inhalation capsule Place 18 mcg into inhaler and inhale daily.   Yes Historical Provider, MD  traMADol (ULTRAM) 50 MG tablet Take 50-100 mg by mouth every 6 (six) hours as needed for moderate pain.   Yes Historical Provider, MD  traZODone (DESYREL) 100 MG tablet Take 150 mg by mouth at bedtime.   Yes Historical Provider, MD  venlafaxine XR (EFFEXOR-XR) 150 MG 24 hr capsule Take 150 mg by mouth every morning.   Yes Historical Provider, MD  Lancet Devices (SIMPLE DIAGNOSTICS LANCING DEV) MISC  10/01/12   Historical Provider, MD  magnesium oxide (MAG-OX) 400 MG tablet Take 1 tablet (400 mg total) by mouth daily. 11/23/12   Leroy Sea, MD     History   Social History  . Marital Status: Married    Spouse Name: N/A    Number of Children: 1  . Years of Education: 12   Occupational History  . Retired Insurance account manager for [employer]    Social History Main Topics  . Smoking status: Former Smoker -- 0.25 packs/day for 20 years    Types: Cigarettes    Quit date: 07/17/2012  . Smokeless tobacco: Never Used     Comment: Still smokes a little.  . Alcohol Use: No     Comment: Patient drinks caffeinated beverages daily.  . Drug Use: No  . Sexual Activity: Not Currently    Birth Control/ Protection: Surgical  Other Topics Concern  . Not on file   Social History Narrative  .  she lives with her husband, walks with a cane. Her husband has been providing care since discharge.     Family Status  Relation Status Death Age  . Mother Deceased 20    No CAD  . Father Deceased 58      No CAD   Family History  Problem Relation Age of Onset  . Stroke Mother   . Breast cancer Sister   . Emphysema Sister   . Cancer Sister     breast  . Alcohol abuse Brother   . Colon cancer      uncles x 2  . Prostate cancer Brother      ROS: She's had some weight loss in the past, weight has been stable over the last 6 months. She still has poor appetite and sometimes has nausea in the evenings. She has had no vomiting and has been able to be appears Full 14 point review of systems complete and found to be negative unless listed above.  Physical Exam: Blood pressure 119/64, pulse 68, temperature 97.9 F (36.6 C), temperature source Oral, resp. rate 18, height 5\' 9"  (1.753 m), weight 138 lb 14.2 oz (63 kg), SpO2 100.00%.  General: Well developed, well nourished, female in no acute distress Head: Eyes PERRLA, No xanthomas.   Normocephalic and atraumatic, oropharynx without edema or exudate. Dentition: Poor Lungs: Rales bases but good air exchange; chest wall tenderness, worst at the lower left rib edge  Heart: HRRR S1 S2, no rub/gallop,  2/6 murmur. pulses are 2+ extrem.   Neck: No carotid bruits. No lymphadenopathy.  JVD minimally elevated . Abdomen: Bowel sounds present, abdomen soft and non-tender without masses or hernias noted. Msk:  No spine or cva tenderness. No weakness, no joint deformities or effusions. Extremities: No clubbing or cyanosis.  no edema.  Neuro: Alert and oriented X 3. No focal deficits noted. Psych:  Good affect, responds appropriately Skin: No rashes or lesions noted.  Labs:  Lab Results  Component Value Date   WBC 19.2* 11/30/2012   HGB 9.9* 11/30/2012   HCT 29.0* 11/30/2012   MCV 89.0 11/30/2012   PLT 352 11/30/2012    Recent Labs  11/29/12 2100  INR 1.03     Recent Labs Lab 11/29/12 2049 11/30/12 0313  NA 127*  --   K 3.7  --   CL 86*  --   CO2 29  --   BUN 25*  --   CREATININE 1.34* 1.22*  CALCIUM 9.1  --   GLUCOSE 98  --      Recent Labs  11/29/12 2341 11/30/12 0314  TROPONINI <0.30 <0.30    Recent Labs  11/29/12 2053  TROPIPOC 0.03   Pro B Natriuretic peptide (BNP)  Date/Time Value Range Status  11/29/2012  9:00 PM 8012.0* 0 - 125 pg/mL Final  11/18/2012  4:08 PM 1238.0* 0.0 - 100.0 pg/mL Final   TSH  Date/Time Value Range Status  11/20/2012  1:08 PM 2.427  0.350 - 4.500 uIU/mL Final     Performed at [facility]     Echo: 11/21/2012 Conclusions - Procedure narrative: Transthoracic echocardiography. Image quality was adequate. The study was technically difficult due to left arm sling. - Left ventricle: The cavity size was normal. Wall thickness was normal. Systolic function was severely reduced. The estimated ejection fraction was in the range of 10% to 15%. Severe diffuse hypokinesis. - Mitral  valve: Moderate regurgitation. - Left atrium: The atrium was moderately dilated. - Right ventricle: The cavity size was mildly dilated. Systolic function was moderately reduced. - Right atrium: The atrium was moderately to severely dilated. - Tricuspid valve: Moderate regurgitation. - Pulmonary arteries: Systolic pressure was moderately increased. PA peak pressure: 56mm Hg (S).  ECG:   11/29/2012 Sinus rhythm, borderline first degree AV block Vent. rate 93 BPM PR interval 204 ms QRS duration 99 ms QT/QTc 502/624 ms P-R-T axes -1 55 57  Radiology:  Ct Angio Chest W/cm &/or Wo Cm 11/29/2012   CLINICAL DATA:  Chest pain starting at 18 30 today. Chest pain goes and comes over the past week. Pain is located on the right anterior side of the chest. Nausea.  EXAM: CT ANGIOGRAPHY CHEST WITH CONTRAST  TECHNIQUE: Multidetector CT imaging of the chest was performed using the standard protocol during bolus administration of intravenous contrast. Multiplanar CT image reconstructions including MIPs were obtained to evaluate the vascular anatomy.  CONTRAST:  80mL OMNIPAQUE IOHEXOL 350 MG/ML SOLN   COMPARISON:  08/04/2005  FINDINGS: Technically adequate study with good opacification of the segmental and central pulmonary arteries. A no focal filling defects are demonstrated. No evidence of significant pulmonary embolus.  Mild cardiac enlargement. Coronary artery calcifications. Normal caliber thoracic aorta with calcification. No significant lymphadenopathy in the chest. The esophagus is decompressed. Airways appear patent. No pleural effusions. There is atelectasis or infiltration in both lung bases. Interstitial changes in the lung bases could represent interstitial edema or fibrosis. No pneumothorax.  There is a comminuted fracture of the proximal left humerus with effusion or hematoma in the left shoulder. This was present on prior chest radiographs as well as on the CT of the left shoulder from 11/21/2012. Visualized portions of the upper abdominal organs demonstrate surgical absence of the gallbladder. No other focal abnormality is suggested.  Review of the MIP images confirms the above findings.  IMPRESSION: No evidence of significant pulmonary embolus. Cardiac enlargement with atelectasis or infiltration in the lung bases. Interstitial changes in the lungs may represent fibrosis or edema. Fracture of the left shoulder with surrounding edema or hematoma, previously evaluated on the prior studies.   Electronically Signed   By: Burman Nieves M.D.   On: 11/29/2012 23:13   Dg Chest Port 1 View 11/29/2012   CLINICAL DATA:  Chest pain and shortness of breath off an on all day. Current smoker.  EXAM: PORTABLE CHEST - 1 VIEW  COMPARISON:  11/19/2012  FINDINGS: Visualization is limited due to overlying electronic device is. The heart is enlarged. The pulmonary vascularity looks normal. No blunting of the right costophrenic angle. Left costophrenic angle is not included within the view. No focal airspace disease. No pneumothorax. Degenerative changes in the shoulders. Fracture deformity of the left shoulder  as previously demonstrated.  IMPRESSION: Cardiac enlargement.  No evidence of active pulmonary disease.   Electronically Signed   By: Burman Nieves M.D.   On: 11/29/2012 21:16    ASSESSMENT AND PLAN:   The patient was seen today by Dr. Jens Som, the patient evaluated and the data reviewed.  Principal Problem:   Atypical chest pain - her pain is pleuritic in nature, and has improved. She also has significant pain from her recent shoulder/arm injury. M.D. advise on trying topical Voltaren gel here (she has at home) or a fentanyl patch to help her musculoskeletal pain issues.  Active Problems:   DEPRESSION, RECURRENT, IN PARTIAL REMISSION   HYPERTENSION  Moderate COPD (chronic obstructive pulmonary disease)   OBSTRUCTIVE SLEEP APNEA    Chronic systolic CHF (congestive heart failure), NYHA class 3 - her EF is severely reduced. Although her BNP is elevated, she does not have orthopnea and her oxygen saturation is within normal limits on room air. She does not have significant volume overload by exam. Her weight is below her last discharge weight at 63 kg. She is currently on her home Lasix dose of 160 mg twice a day. Her BUN and creatinine are elevated over her discharge labs. M.D. advise on decreasing Lasix, continuing to follow weights and arranging a transition of care appointment in the office.    Fracture of left humerus   Syncope   History of bradycardia   Chest pain   Signed: Theodore Demark, PA-C 11/30/2012 8:05 AM Beeper [phone]  Co-Sign MD As above, patient seen and examined.briefly she is a 67 year old female with a past medical history of coronary artery disease, cardiomyopathy, recurrent syncope, diabetes, chronic systolic congestive heart failure for evaluation of chest pain. Patient admitted earlier this month following a syncopal episode. She suffered a fracture of her left shoulder. She was seen by electrophysiology and ICD was felt indicated but was delayed until her  shoulder improved. She was discharged with a life vest. Readmitted early this a.m. With chest pain. The pain is under the right breast and described as a sharp pain. Increased with inspiration. There was shortness of breath and nausea but no diaphoresis. Pain would last 5-10 minutes and resolved. Note she had a cardiac catheterization in April of this year and was noted to have distal LAD and PDA disease that was not amenable to PCI. Enzymes are negative. Electrocardiogram shows sinus rhythm, first degree AV block, inferior lateral T-wave inversion and prolonged QT interval. Symptoms are most consistent with musculoskeletal pain. Recent catheterization as outlined. Would continue medical therapy. She is hyponatremic and I would discontinue her Zaroxolyn. Continue remaining cardiac medications. Continue life vest. She is scheduled to see orthopedics early next week. She will need close followup with [name] and Dr Graciela Husbands. 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 She will need a BMET Monday given renal insuff and CTA performed by hospitalist service to r/o pulmonary embolus (risk of contrast nephropathy).  Olga Millers

## 2012-11-30 NOTE — ED Notes (Signed)
Transporting patient to new room assignment. 

## 2012-11-30 NOTE — ED Notes (Signed)
[place] monitor placed back on pt by cardiologist.

## 2012-11-30 NOTE — ED Notes (Signed)
MD informed this RN that admitting MD wants to see the pt in the ED before the pt goes upstairs.

## 2012-11-30 NOTE — Progress Notes (Signed)
Patient admitted with acute episode of chest pain.  Patient's Hospice diagnosis is CAD and patient is a full code.  Found patient resting, FLACC score 0.  Reviewed chart and patient has received chest xray, chest CT and troponin level and all were negative.  Patient's chest pain has resolved and she has been comfortable.  Anticipate discharge home with continued Hospice services and encourage a call to Hospice at [phone] with any needs or at time of discharge.  April Costella Hatcher, RN, BSN Hospice

## 2012-11-30 NOTE — Progress Notes (Signed)
Nutrition Brief Note  Patient identified on the Malnutrition Screening Tool (MST) Report for weight loss. 3% overall weight loss in the past 6 months is insignificant. Patient is followed by Hospice at home; is a full code. Eating well at this time.   Wt Readings from Last 15 Encounters:  11/30/12 138 lb 14.2 oz (63 kg)  11/23/12 141 lb 12.1 oz (64.3 kg)  11/18/12 153 lb 6.4 oz (69.582 kg)  10/30/12 150 lb (68.04 kg)  09/17/12 154 lb (69.854 kg)  09/17/12 152 lb 9.6 oz (69.219 kg)  09/04/12 155 lb (70.308 kg)  09/04/12 154 lb 6.4 oz (70.035 kg)  08/22/12 151 lb (68.493 kg)  08/21/12 154 lb (69.854 kg)  08/14/12 152 lb (68.947 kg)  08/07/12 154 lb (69.854 kg)  07/16/12 144 lb 12.8 oz (65.681 kg)  06/19/12 146 lb 8 oz (66.452 kg)  06/12/12 143 lb (64.864 kg)    Body mass index is 20.5 kg/(m^2). Patient meets criteria for normal weight based on current BMI.   Current diet order is CHO-modified, tolerating well at this time. Labs and medications reviewed.   No nutrition interventions warranted at this time. If nutrition issues arise, please consult RD.   Joaquin Courts, RD, LDN, CNSC Pager [phone] After Hours Pager [phone]

## 2012-11-30 NOTE — H&P (Signed)
[facility] History and Physical  HARMONEE TOZER WUJ:[id number] DOB: 04-15-1945    PCP:   [name], MD   Chief Complaint:  chest pain for several hours today.  HPI: ZAFIRAH VANZEE is an 67 y.o. female with PMH CAD, Systolic heart failure EF 40 % by echo 2013, on hospice for heart failure, Obstructive sleep apnea, hyponatremia, COPD on home oxygen 2 L, recent admitted for syncope felt to be secondary to dysrythmia, seen by cardiology Dr Graciela Husbands, and placed on a life vest, returned today because of several hours of chest pain without shortness of breath, nausea or vomiting.  During the last syncope, she also had a fracture shoulder, and has been in a splint.  In the ER, attempted at [facility] was unssucessful but it was done with removal of her life vest, and showed no pulmonary embolism.   Cardiology was consulted, deferred admission to hospitalist but helped put her life vest back.  Evaluation included a negative CXR,  negative troponin, but EKG did show new T wave inversions that are new in the precordial leads.  In the ER, she was pain free.  Rewiew of Systems:  Constitutional: Negative for malaise, fever and chills. No significant weight loss or weight gain Eyes: Negative for eye pain, redness and discharge, diplopia, visual changes, or flashes of light. ENMT: Negative for ear pain, hoarseness, nasal congestion, sinus pressure and sore throat. No headaches; tinnitus, drooling, or problem swallowing. Cardiovascular: Negative for  palpitations, diaphoresis, dyspnea and peripheral edema. ; No orthopnea, PND Respiratory: Negative for cough, hemoptysis, wheezing and stridor. No pleuritic chestpain. Gastrointestinal: Negative for nausea, vomiting, diarrhea, constipation, abdominal pain, melena, blood in stool, hematemesis, jaundice and rectal bleeding.    Genitourinary: Negative for frequency, dysuria, incontinence,flank pain and hematuria; Musculoskeletal: Negative for back pain and neck  pain. Negative for swelling and trauma.;  Skin: . Negative for pruritus, rash, abrasions, bruising and skin lesion.; ulcerations Neuro: Negative for headache, lightheadedness and neck stiffness. Negative for weakness, altered level of consciousness , altered mental status, extremity weakness, burning feet, involuntary movement, seizure and syncope.  Psych: negative for anxiety, depression, insomnia, tearfulness, panic attacks, hallucinations, paranoia, suicidal or homicidal ideation    Past Medical History  Diagnosis Date  . DM   . DYSLIPIDEMIA   . OBESITY NOS   . DEPRESSION, RECURRENT, IN PARTIAL REMISSION   . HYPERTENSION   . CORONARY ARTERY DISEASE     a. s/p Q wave MI 2002 with RCA stent. b. Abnl nuc 04/2012 with subsequent cath showing patent stents in RCA, chronic diffuse disease in dLAD, 20% LM, EF 20%, no focal targets for PCI.  [name] Chronic systolic CHF (congestive heart failure)     a. Last EF 20% by cath 04/29/2012, 40-45% by echo 04/24/12.  [name] GERD   . DIVERTICULOSIS, COLON, WITH HEMORRHAGE   . BACK PAIN, LUMBAR, WITH RADICULOPATHY   . OBSTRUCTIVE SLEEP APNEA   . Anemia, iron deficiency   . Hyponatremia   . Tobacco abuse   . Moderate COPD (chronic obstructive pulmonary disease) 11/07/2005    Spirometry 03/09/11>>FEV1 2.02 (88%), FEV1% 76    . Anxiety   . Arthritis   . Myocardial infarction 2001  . Dementia     a. Saw neuro 08/2012: concerning for early dementia, labs unremarkable except elev CRP.  [name] Syncope     a. 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 BB discontinued. b. Recurrence in 11/2012.  [name] Chronic edema   . Weight loss  CT abd pelvis 05/2012: mild small bowel disention. No significant acute changes. Mod-severe gastritis by EGD 06/06/2012 no clear neoplastic changes. Neg small bowel follow through except for diverticulum on 06/11/12.   . Gastritis     a. by EGD 05/2012.    Past Surgical History  Procedure Laterality Date  . Uvuloplasty    . Total  abdominal hysterectomy w/ bilateral salpingoophorectomy    . Abdominal hysterectomy    . Cholecystectomy  2004  . Coronary angioplasty with stent placement  2002    last 04-29-12  . Esophagogastroduodenoscopy (egd) with propofol N/A 06/06/2012    Procedure: ESOPHAGOGASTRODUODENOSCOPY (EGD) WITH PROPOFOL;  Surgeon: Rachael Fee, MD;  Location: [facility];  Service: Endoscopy;  Laterality: N/A;    Medications:  HOME MEDS: Prior to Admission medications   Medication Sig Start Date End Date Taking? Authorizing Provider  acetaminophen (TYLENOL) 500 MG tablet Take 500 mg by mouth every 6 (six) hours as needed for pain.   Yes Historical Provider, MD  albuterol (PROVENTIL) (2.5 MG/3ML) 0.083% nebulizer solution Take 2.5 mg by nebulization every 6 (six) hours as needed for wheezing.   Yes Historical Provider, MD  aspirin 81 MG tablet Take 81 mg by mouth daily.     Yes Historical Provider, MD  atorvastatin (LIPITOR) 40 MG tablet Take 40 mg by mouth every morning.   Yes Historical Provider, MD  diclofenac sodium (VOLTAREN) 1 % GEL Apply 2 g topically 4 (four) times daily. 06/12/12  Yes Karen Prueter, PA-C  feeding supplement (GLUCERNA SHAKE) LIQD Take 237 mLs by mouth as needed.    Yes Historical Provider, MD  furosemide (LASIX) 80 MG tablet Take 160 mg by mouth 2 (two) times daily.   Yes Historical Provider, MD  gabapentin (NEURONTIN) 300 MG capsule Take 1 capsule (300 mg total) by mouth 2 (two) times daily. 06/12/12  Yes Clydie Braun Prueter, PA-C  hydrALAZINE (APRESOLINE) 50 MG tablet Take 50 mg by mouth every 8 (eight) hours.   Yes Historical Provider, MD  HYDROcodone-acetaminophen (NORCO/VICODIN) 5-325 MG per tablet Take 1-2 tablets by mouth every 4 (four) hours as needed for moderate pain. 11/22/12  Yes Leroy Sea, MD  hydrOXYzine (ATARAX/VISTARIL) 10 MG tablet Take 10 mg by mouth 2 (two) times daily.   Yes Historical Provider, MD  Iron-Folic Acid-B12-C-Docusate (FERRAPLUS 90) 90-1 MG TABS Take 1  tablet by mouth daily. 10/30/12  Yes [name], MD  isosorbide mononitrate (IMDUR) 60 MG 24 hr tablet Take 60 mg by mouth daily at 12 noon.   Yes Historical Provider, MD  losartan (COZAAR) 50 MG tablet Take 1 tablet (50 mg total) by mouth daily. 10/30/12  Yes [name], MD  metolazone (ZAROXOLYN) 2.5 MG tablet Take 2.5 mg by mouth 3 (three) times a week. Tuesdays Thursdays and sundays 09/04/12  Yes Dyann Kief, PA-C  nitroGLYCERIN (NITROSTAT) 0.4 MG SL tablet Place 1 tablet (0.4 mg total) under the tongue every 5 (five) minutes x 3 doses as needed for chest pain. 04/30/12  Yes Rhonda G Barrett, PA-C  omeprazole (PRILOSEC) 40 MG capsule Take 40 mg by mouth daily.   Yes Historical Provider, MD  polyethylene glycol (MIRALAX / GLYCOLAX) packet Take 17 g by mouth daily as needed (for constipation).   Yes Historical Provider, MD  potassium chloride (K-DUR,KLOR-CON) 10 MEQ tablet Take 10-20 mEq by mouth See admin instructions. Take 3 tablets twice daily on days that Metolazone is taken. Take 2 tablets twice daily on other days  Yes Historical Provider, MD  sitaGLIPtan-metformin (JANUMET) 50-500 MG per tablet Take 1 tablet by mouth 2 (two) times daily with a meal.   Yes Historical Provider, MD  spironolactone (ALDACTONE) 25 MG tablet Take 1 tablet (25 mg total) by mouth daily. 11/18/12  Yes Rosalio Macadamia, NP  tiotropium (SPIRIVA) 18 MCG inhalation capsule Place 18 mcg into inhaler and inhale daily.   Yes Historical Provider, MD  traMADol (ULTRAM) 50 MG tablet Take 50-100 mg by mouth every 6 (six) hours as needed for moderate pain.   Yes Historical Provider, MD  traZODone (DESYREL) 100 MG tablet Take 150 mg by mouth at bedtime.   Yes Historical Provider, MD  venlafaxine XR (EFFEXOR-XR) 150 MG 24 hr capsule Take 150 mg by mouth every morning.   Yes Historical Provider, MD  Lancet Devices (SIMPLE DIAGNOSTICS LANCING DEV) MISC  10/01/12   Historical Provider, MD  magnesium oxide (MAG-OX) 400 MG  tablet Take 1 tablet (400 mg total) by mouth daily. 11/23/12   Leroy Sea, MD     Allergies:  Allergies  Allergen Reactions  . Amlodipine Other (See Comments)    constipation  . Ramipril Cough  . Adhesive [Tape] Hives    "peels skin off"  . Morphine And Related Other (See Comments)    Per Husband, Pt has hallucinations  . Penicillins Itching  . Codeine Rash    Social History:   reports that she quit smoking about 4 months ago. Her smoking use included Cigarettes. She has a 5 pack-year smoking history. She has never used smokeless tobacco. She reports that she does not drink alcohol or use illicit drugs.  Family History: Family History  Problem Relation Age of Onset  . Stroke Mother   . Breast cancer Sister   . Emphysema Sister   . Cancer Sister     breast  . Alcohol abuse Brother   . Colon cancer      uncles x 2  . Prostate cancer Brother      Physical Exam: Filed Vitals:   11/29/12 2224 11/29/12 2245 11/30/12 0000 11/30/12 0015  BP: 137/70 139/68 143/75 125/74  Pulse: 90 91 92 91  Temp:      TempSrc:      Resp: 20 16 25 18   SpO2: 100% 100% 96% 100%   Blood pressure 125/74, pulse 91, temperature 99 F (37.2 C), temperature source Oral, resp. rate 18, SpO2 100.00%.  GEN:  Pleasant  patient lying in the stretcher in no acute distress; cooperative with exam. PSYCH:  alert and oriented x4; does not appear anxious or depressed; affect is appropriate. HEENT: Mucous membranes pink and anicteric; PERRLA; EOM intact; no cervical lymphadenopathy nor thyromegaly or carotid bruit; no JVD; There were no stridor. Neck is very supple. Breasts:: Not examined CHEST WALL: No tenderness CHEST: Normal respiration, clear to auscultation bilaterally.  HEART: regular rate and rhythm.  There are a SEM at LSB with no rub, or gallops.   BACK: No kyphosis or scoliosis; no CVA tenderness ABDOMEN: soft and non-tender; no masses, no organomegaly, normal abdominal bowel sounds; no  pannus; no intertriginous candida. There is no rebound and no distention. Rectal Exam: Not done EXTREMITIES: No bone or joint deformity; age-appropriate arthropathy of the hands and knees; no edema; no ulcerations.  There is no calf tenderness. Genitalia: not examined PULSES: 2+ and symmetric SKIN: Normal hydration no rash or ulceration CNS: Cranial nerves 2-12 grossly intact no focal lateralizing neurologic deficit.  Speech is fluent; uvula  elevated with phonation, facial symmetry and tongue midline. DTR are normal bilaterally, cerebella exam is intact, barbinski is negative and strengths are equaled bilaterally.  No sensory loss.   Labs on Admission:  Basic Metabolic Panel:  Recent Labs Lab 11/23/12 0600 11/29/12 2049  NA  --  127*  K 4.1 3.7  CL  --  86*  CO2  --  29  GLUCOSE  --  98  BUN  --  25*  CREATININE  --  1.34*  CALCIUM  --  9.1  MG 1.6  --    Liver Function Tests: No results found for this basename: AST, ALT, ALKPHOS, BILITOT, PROT, ALBUMIN,  in the last 168 hours No results found for this basename: LIPASE, AMYLASE,  in the last 168 hours No results found for this basename: AMMONIA,  in the last 168 hours CBC:  Recent Labs Lab 11/23/12 0600 11/29/12 2049  WBC 4.6 9.4  HGB 9.3* 10.9*  HCT 28.4* 31.6*  MCV 90.7 88.3  PLT 330 395   Cardiac Enzymes:  Recent Labs Lab 11/29/12 2341  TROPONINI <0.30    CBG:  Recent Labs Lab 11/23/12 0546  GLUCAP 112*     Radiological Exams on Admission: Ct Angio Chest W/cm &/or Wo Cm  11/29/2012   CLINICAL DATA:  Chest pain starting at 18 30 today. Chest pain goes and comes over the past week. Pain is located on the right anterior side of the chest. Nausea.  EXAM: CT ANGIOGRAPHY CHEST WITH CONTRAST  TECHNIQUE: Multidetector CT imaging of the chest was performed using the standard protocol during bolus administration of intravenous contrast. Multiplanar CT image reconstructions including MIPs were obtained to evaluate  the vascular anatomy.  CONTRAST:  80mL OMNIPAQUE IOHEXOL 350 MG/ML SOLN  COMPARISON:  08/04/2005  FINDINGS: Technically adequate study with good opacification of the segmental and central pulmonary arteries. A no focal filling defects are demonstrated. No evidence of significant pulmonary embolus.  Mild cardiac enlargement. Coronary artery calcifications. Normal caliber thoracic aorta with calcification. No significant lymphadenopathy in the chest. The esophagus is decompressed. Airways appear patent. No pleural effusions. There is atelectasis or infiltration in both lung bases. Interstitial changes in the lung bases could represent interstitial edema or fibrosis. No pneumothorax.  There is a comminuted fracture of the proximal left humerus with effusion or hematoma in the left shoulder. This was present on prior chest radiographs as well as on the CT of the left shoulder from 11/21/2012. Visualized portions of the upper abdominal organs demonstrate surgical absence of the gallbladder. No other focal abnormality is suggested.  Review of the MIP images confirms the above findings.  IMPRESSION: No evidence of significant pulmonary embolus. Cardiac enlargement with atelectasis or infiltration in the lung bases. Interstitial changes in the lungs may represent fibrosis or edema. Fracture of the left shoulder with surrounding edema or hematoma, previously evaluated on the prior studies.   Electronically Signed   By: Burman Nieves M.D.   On: 11/29/2012 23:13   Dg Chest Port 1 View  11/29/2012   CLINICAL DATA:  Chest pain and shortness of breath off an on all day. Current smoker.  EXAM: PORTABLE CHEST - 1 VIEW  COMPARISON:  11/19/2012  FINDINGS: Visualization is limited due to overlying electronic device is. The heart is enlarged. The pulmonary vascularity looks normal. No blunting of the right costophrenic angle. Left costophrenic angle is not included within the view. No focal airspace disease. No pneumothorax.  Degenerative changes in the shoulders. Fracture  deformity of the left shoulder as previously demonstrated.  IMPRESSION: Cardiac enlargement.  No evidence of active pulmonary disease.   Electronically Signed   By: Burman Nieves M.D.   On: 11/29/2012 21:16    EKG: Independently reviewed. NSR with new T wave inversions over the lateral leads.   Assessment/Plan Present on Admission:  . Chest pain . Atypical chest pain . Chronic systolic CHF (congestive heart failure), NYHA class 3 . DEPRESSION, RECURRENT, IN PARTIAL REMISSION . History of bradycardia . HYPERTENSION . Moderate COPD (chronic obstructive pulmonary disease) . OBSTRUCTIVE SLEEP APNEA . Syncope  PLAN:  This patient with hx CHF, and dysrythmia on life vest recently discharged will be readmitted for atypical chest pain, but with EKG changes.  Currently she is comfortable and will be admitted to telemetry for r/out with cycling her troponin.  Currently she is pain free and is feeling comfortable.  I will admit her to telemetry.  For her HTN, will continue all her meds. For her DM, will continue Jenumet and use SSI as needed.  Her recent Fx left shoulder will be placed back on her sling.  She was given PRN fentanyl for her pain.  She is otherwise stable, full code, and will be admitted to hospitalist service.  Please let her cardiology know of her admission.  Thank you so much for allowing me to participate in her care.  Other plans as per orders.  Code Status: FULL Unk Lightning, MD. [facility] Pager [phone] 7pm to 7am.  11/30/2012, 1:12 AM

## 2012-12-01 LAB — BASIC METABOLIC PANEL
BUN: 34 mg/dL — ABNORMAL HIGH (ref 6–23)
Calcium: 8.8 mg/dL (ref 8.4–10.5)
Creatinine, Ser: 1.38 mg/dL — ABNORMAL HIGH (ref 0.50–1.10)
GFR calc Af Amer: 44 mL/min — ABNORMAL LOW (ref >90)
GFR calc non Af Amer: 38 mL/min — ABNORMAL LOW (ref >90)
Glucose, Bld: 95 mg/dL (ref 70–99)

## 2012-12-01 LAB — CBC
HCT: 29.6 % — ABNORMAL LOW (ref 36.0–46.0)
MCH: 30.4 pg (ref 26.0–34.0)
MCHC: 34.5 g/dL (ref 30.0–36.0)
MCV: 88.1 fL (ref 78.0–100.0)
RDW: 16.8 % — ABNORMAL HIGH (ref 11.5–15.5)

## 2012-12-01 LAB — GLUCOSE, CAPILLARY: Glucose-Capillary: 86 mg/dL (ref 70–99)

## 2012-12-01 NOTE — Progress Notes (Signed)
   CARE MANAGEMENT NOTE 12/01/2012  Patient:  [name], [name]   Account Number:  [id number]  Date Initiated:  12/01/2012  Documentation initiated by:  [facility]  Subjective/Objective Assessment:   adm: chest pain     Action/Plan:   discharge planning   Anticipated DC Date:  12/01/2012   Anticipated DC Plan:  HOME W HOSPICE CARE      DC Planning Services  CM consult      Choice offered to / List presented to:          [facility] arranged  HH-1 RN  HH-6 SOCIAL WORKER      Status of service:  Completed, signed off Medicare Important Message given?   (If response is "NO", the following Medicare IM given date fields will be blank) Date Medicare IM given:   Date Additional Medicare IM given:    Discharge Disposition:  HOME W HOSPICE CARE  Per UR Regulation:    If discussed at Long Length of Stay Meetings, dates discussed:    Comments:  12/01/12 11:13 MD called to notify of discharge.  Pt is active with [facility].  Call placed to [phone] per request of Hospice to notify of discharge. No other CM needs were communicated.  Freddy Jaksch, BSN, CM [phone].

## 2012-12-01 NOTE — Progress Notes (Signed)
Patient admitted with chest pain.  Patient is a full code.  Found patient awaiting discharge home with continued Hospice services.  Patient's chest pain has been resolved and no new issues noted.  Continue current plan of care and encourage a call to Hospice at [phone] with any further needs.  April Costella Hatcher, RN, BSN Hospice

## 2012-12-01 NOTE — Discharge Summary (Signed)
Physician Discharge Summary  Patient ID: [name] MRN: [id number] DOB/AGE: [birth year] 68 y.o.  Admit date: 11/29/2012 Discharge date: 12/01/2012  Primary Care Physician:  [name], MD  Discharge Diagnoses:    . Chest pain likely musculoskeletal resolved   Secondary discharge diagnosis . leukocytosis- likely stress demargination no signs of infection . Chronic systolic CHF (congestive heart failure), NYHA class 3 . DEPRESSION, RECURRENT, IN PARTIAL REMISSION . History of bradycardia . HYPERTENSION . Moderate COPD (chronic obstructive pulmonary disease) . OBSTRUCTIVE SLEEP APNEA   Consults: Cardiology, Dr. Jens Som   Recommendations for Outpatient Follow-up:   Please follow outpatient BMET for her renal function  She needs to followup with Dr. Ranell Patrick in one week for the left shoulder fracture   Allergies:   Allergies  Allergen Reactions  . Amlodipine Other (See Comments)    constipation  . Ramipril Cough  . Adhesive [Tape] Hives    "peels skin off"  . Morphine And Related Other (See Comments)    Per Husband, Pt has hallucinations  . Penicillins Itching  . Codeine Rash     Discharge Medications:   Medication List    STOP taking these medications       metolazone 2.5 MG tablet  Commonly known as:  ZAROXOLYN      TAKE these medications       acetaminophen 500 MG tablet  Commonly known as:  TYLENOL  Take 500 mg by mouth every 6 (six) hours as needed for pain.     albuterol (2.5 MG/3ML) 0.083% nebulizer solution  Commonly known as:  PROVENTIL  Take 2.5 mg by nebulization every 6 (six) hours as needed for wheezing.     aspirin 81 MG tablet  Take 81 mg by mouth daily.     atorvastatin 40 MG tablet  Commonly known as:  LIPITOR  Take 40 mg by mouth every morning.     diclofenac sodium 1 % Gel  Commonly known as:  VOLTAREN  Apply 2 g topically 4 (four) times daily.     feeding supplement (GLUCERNA SHAKE) Liqd  Take 237 mLs by mouth as  needed.     FERRAPLUS 90 90-1 MG Tabs  Take 1 tablet by mouth daily.     furosemide 80 MG tablet  Commonly known as:  LASIX  Take 160 mg by mouth 2 (two) times daily.     gabapentin 300 MG capsule  Commonly known as:  NEURONTIN  Take 1 capsule (300 mg total) by mouth 2 (two) times daily.     hydrALAZINE 50 MG tablet  Commonly known as:  APRESOLINE  Take 50 mg by mouth every 8 (eight) hours.     HYDROcodone-acetaminophen 5-325 MG per tablet  Commonly known as:  NORCO/VICODIN  Take 1-2 tablets by mouth every 4 (four) hours as needed for moderate pain.     hydrOXYzine 10 MG tablet  Commonly known as:  ATARAX/VISTARIL  Take 10 mg by mouth 2 (two) times daily.     isosorbide mononitrate 60 MG 24 hr tablet  Commonly known as:  IMDUR  Take 60 mg by mouth daily at 12 noon.     losartan 50 MG tablet  Commonly known as:  COZAAR  Take 1 tablet (50 mg total) by mouth daily.     magnesium oxide 400 MG tablet  Commonly known as:  MAG-OX  Take 1 tablet (400 mg total) by mouth daily.     nitroGLYCERIN 0.4 MG SL tablet  Commonly known as:  NITROSTAT  Place 1 tablet (0.4 mg total) under the tongue every 5 (five) minutes x 3 doses as needed for chest pain.     omeprazole 40 MG capsule  Commonly known as:  PRILOSEC  Take 40 mg by mouth daily.     polyethylene glycol packet  Commonly known as:  MIRALAX / GLYCOLAX  Take 17 g by mouth daily as needed (for constipation).     potassium chloride 10 MEQ tablet  Commonly known as:  K-DUR,KLOR-CON  Take 10-20 mEq by mouth See admin instructions. Take 3 tablets twice daily on days that Metolazone is taken. Take 2 tablets twice daily on other days     SIMPLE DIAGNOSTICS LANCING DEV Misc     sitaGLIPtin-metformin 50-500 MG per tablet  Commonly known as:  JANUMET  Take 1 tablet by mouth 2 (two) times daily with a meal.     spironolactone 25 MG tablet  Commonly known as:  ALDACTONE  Take 1 tablet (25 mg total) by mouth daily.      tiotropium 18 MCG inhalation capsule  Commonly known as:  SPIRIVA  Place 18 mcg into inhaler and inhale daily.     traMADol 50 MG tablet  Commonly known as:  ULTRAM  Take 50-100 mg by mouth every 6 (six) hours as needed for moderate pain.     traZODone 100 MG tablet  Commonly known as:  DESYREL  Take 150 mg by mouth at bedtime.     venlafaxine XR 150 MG 24 hr capsule  Commonly known as:  EFFEXOR-XR  Take 150 mg by mouth every morning.         Brief H and P: For complete details please refer to admission H and P, but in brief patient is a 67-year-old female with past medical history of CAD,Systolic heart failure EF 40 % by echo 2013, on hospice for heart failure, Obstructive sleep apnea, hyponatremia, COPD on home oxygen 2 L, recent admitted for syncope felt to be secondary to dysrythmia, seen by cardiology Dr Graciela Husbands, and placed on a life vest, returned today because of several hours of chest pain without shortness of breath, nausea or vomiting. During the last syncope, she also had a fracture shoulder, and has been in a sling.  In the ER, attempted at CTPA was unssucessful but it was done with removal of her life vest, and showed no pulmonary embolism. Cardiology was consulted, deferred admission to hospitalist but helped put her life vest back. Evaluation included a negative CXR, negative troponin, but EKG did show new T wave inversions that are new in the precordial leads. In the ER, she was pain free.   Hospital Course:  67 year old female with history of CAD, ICM EF 20%, prev. MI/RCA stents in 2002 who was recently admitted 11/18-11/22 a syncopal episode resulting in left shoulder/humerus fracture. Cardiology was consulted and patient had life vest placed. EF 20%, plan was for ICD once she has recovered from her shoulder fracture.  Atypical Chest pain: Likely musculoskeletal, resolved. She was ruled out for acute ACS, troponins negative, EKG was negative for any acute ST-T wave changes  suggestive for ischemia.  However with extensive cardiac history, cardiology consult was called. Patient was seen by Dr. Jens Som, who reviewed all the records. She has had cardiac cath in April of this year and was noted to have distal LAD and PDA disease that was not amenable to PCI. Cardiology, continue medical therapy, recommended to discontinue Zaroxolyn. Continue life vest and close followup with  [name] and Dr. Graciela Husbands, Not on beta blocker due to Mobitz type I.   Leukocytosis:  white count was 19.2 at the time of admission, patient had no signs of infection, cultures has been negative so far you wish did not show any UTI. Chest x-ray was negative for any acute lung infiltrates. Patient was not started on any antibiotics. She remained afebrile. White count improved to 7.4 today, likely stress demargination.  Left shoulder fracture: Outpatient follow up with Dr. Ranell Patrick, continue sling.   Patient is followed with hospice.   Day of Discharge BP 121/60  Pulse 91  Temp(Src) 98.7 F (37.1 C) (Oral)  Resp 18  Ht 5\' 9"  (1.753 m)  Wt 63 kg (138 lb 14.2 oz)  BMI 20.50 kg/m2  SpO2 99%  Physical Exam: General: Alert and awake oriented x3 not in any acute distress. CVS: S1-S2 clear no murmur rubs or gallops Chest: clear to auscultation bilaterally, no wheezing rales or rhonchi Abdomen: soft nontender, nondistended, normal bowel sounds Extremities: no cyanosis, clubbing or edema noted bilaterally    The results of significant diagnostics from this hospitalization (including imaging, microbiology, ancillary and laboratory) are listed below for reference.    LAB RESULTS: Basic Metabolic Panel:  Recent Labs Lab 11/29/12 2049 11/30/12 0313 12/01/12 0559  NA 127*  --  130*  K 3.7  --  3.7  CL 86*  --  88*  CO2 29  --  31  GLUCOSE 98  --  95  BUN 25*  --  34*  CREATININE 1.34* 1.22* 1.38*  CALCIUM 9.1  --  8.8   Liver Function Tests: No results found for this basename: AST, ALT,  ALKPHOS, BILITOT, PROT, ALBUMIN,  in the last 168 hours No results found for this basename: LIPASE, AMYLASE,  in the last 168 hours No results found for this basename: AMMONIA,  in the last 168 hours CBC:  Recent Labs Lab 11/30/12 0313 12/01/12 0559  WBC 19.2* 7.4  HGB 9.9* 10.2*  HCT 29.0* 29.6*  MCV 89.0 88.1  PLT 352 381   Cardiac Enzymes:  Recent Labs Lab 11/30/12 0910 11/30/12 1548  TROPONINI <0.30 <0.30   BNP: No components found with this basename: POCBNP,  CBG:  Recent Labs Lab 11/30/12 2035 12/01/12 0736  GLUCAP 95 86    Significant Diagnostic Studies:  Ct Angio Chest W/cm &/or Wo Cm  11/29/2012   CLINICAL DATA:  Chest pain starting at 18 30 today. Chest pain goes and comes over the past week. Pain is located on the right anterior side of the chest. Nausea.  EXAM: CT ANGIOGRAPHY CHEST WITH CONTRAST  TECHNIQUE: Multidetector CT imaging of the chest was performed using the standard protocol during bolus administration of intravenous contrast. Multiplanar CT image reconstructions including MIPs were obtained to evaluate the vascular anatomy.  CONTRAST:  80mL OMNIPAQUE IOHEXOL 350 MG/ML SOLN  COMPARISON:  08/04/2005  FINDINGS: Technically adequate study with good opacification of the segmental and central pulmonary arteries. A no focal filling defects are demonstrated. No evidence of significant pulmonary embolus.  Mild cardiac enlargement. Coronary artery calcifications. Normal caliber thoracic aorta with calcification. No significant lymphadenopathy in the chest. The esophagus is decompressed. Airways appear patent. No pleural effusions. There is atelectasis or infiltration in both lung bases. Interstitial changes in the lung bases could represent interstitial edema or fibrosis. No pneumothorax.  There is a comminuted fracture of the proximal left humerus with effusion or hematoma in the left shoulder. This was present on  prior chest radiographs as well as on the CT of the  left shoulder from 11/21/2012. Visualized portions of the upper abdominal organs demonstrate surgical absence of the gallbladder. No other focal abnormality is suggested.  Review of the MIP images confirms the above findings.  IMPRESSION: No evidence of significant pulmonary embolus. Cardiac enlargement with atelectasis or infiltration in the lung bases. Interstitial changes in the lungs may represent fibrosis or edema. Fracture of the left shoulder with surrounding edema or hematoma, previously evaluated on the prior studies.   Electronically Signed   By: Burman Nieves M.D.   On: 11/29/2012 23:13   Dg Chest Port 1 View  11/29/2012   CLINICAL DATA:  Chest pain and shortness of breath off an on all day. Current smoker.  EXAM: PORTABLE CHEST - 1 VIEW  COMPARISON:  11/19/2012  FINDINGS: Visualization is limited due to overlying electronic device is. The heart is enlarged. The pulmonary vascularity looks normal. No blunting of the right costophrenic angle. Left costophrenic angle is not included within the view. No focal airspace disease. No pneumothorax. Degenerative changes in the shoulders. Fracture deformity of the left shoulder as previously demonstrated.  IMPRESSION: Cardiac enlargement.  No evidence of active pulmonary disease.   Electronically Signed   By: Burman Nieves M.D.   On: 11/29/2012 21:16       Disposition and Follow-up: Discharge Orders   Future Appointments Provider Department Dept Phone   12/04/2012 2:00 PM Kennon Rounds [facility] [place] Office [phone]   12/13/2012 10:00 AM [facility] MEDICAL ONCOLOGY [phone]   12/17/2012 8:30 AM Si Gaul, MD [facility] MEDICAL ONCOLOGY [phone]   02/14/2013 12:00 PM Huston Foley, MD [facility] [phone]   Future Orders Complete By Expires   (HEART FAILURE PATIENTS) Call MD:  Anytime you have any of the following symptoms: 1) 3 pound weight  gain in 24 hours or 5 pounds in 1 week 2) shortness of breath, with or without a dry hacking cough 3) swelling in the hands, feet or stomach 4) if you have to sleep on extra pillows at night in order to breathe.  As directed    Diet Carb Modified  As directed    Discharge instructions  As directed    Comments:     Please draw BMET for renal function tomorrow   Increase activity slowly  As directed        DISPOSITION: Home  DIET:Carb modified    TESTS THAT NEED FOLLOW-UP Renal function  DISCHARGE FOLLOW-UP Follow-up Information   Follow up with NORRIS,STEVEN R, MD In 1 week.   Specialty:  Orthopedic Surgery   Contact information:   [address] [place] [address] [phone]       Follow up with Tereso Newcomer, PA-C On 12/04/2012. (at 2:00PM)    Specialty:  Physician Assistant   Contact information:   [address] [place] [address] [phone]       Follow up with [name], MD. Schedule an appointment as soon as possible for a visit in 2 weeks.   Specialty:  Internal Medicine   Contact information:   [address] [place] [address] [phone]       Time spent on Discharge: 40 minutes  Signed:   Tawonda Legaspi M.D. [facility] 12/01/2012, 11:07 AM Pager: [phone]

## 2012-12-02 ENCOUNTER — Telehealth: Payer: Self-pay | Admitting: Cardiology

## 2012-12-02 ENCOUNTER — Other Ambulatory Visit (INDEPENDENT_AMBULATORY_CARE_PROVIDER_SITE_OTHER): Payer: Medicare Other

## 2012-12-02 DIAGNOSIS — E871 Hypo-osmolality and hyponatremia: Secondary | ICD-10-CM

## 2012-12-02 LAB — BASIC METABOLIC PANEL
BUN: 35 mg/dL — ABNORMAL HIGH (ref 6–23)
CO2: 29 mEq/L (ref 19–32)
Chloride: 89 mEq/L — ABNORMAL LOW (ref 96–112)
Creatinine, Ser: 1.7 mg/dL — ABNORMAL HIGH (ref 0.4–1.2)
Potassium: 4.7 mEq/L (ref 3.5–5.1)
Sodium: 128 mEq/L — ABNORMAL LOW (ref 135–145)

## 2012-12-02 LAB — URINE CULTURE: Colony Count: >=100000

## 2012-12-02 NOTE — Telephone Encounter (Signed)
Patient under [facility], verbal order given to [employer]

## 2012-12-02 NOTE — Telephone Encounter (Signed)
New problem   Please make sure HHRN draws a BMET today for renal insufficiency, results to [place].   Thank you!  [name]   This came as a Personnel officer from [facility].

## 2012-12-02 NOTE — Telephone Encounter (Signed)
I spoke with the Hospice nurse and the pt will be near our office today for another MD appointment. The pt will come into our office today for BMP to be drawn.

## 2012-12-02 NOTE — Telephone Encounter (Signed)
New problem   Per [name] need to know if she need to draw Bmet or can pt come in office.

## 2012-12-03 ENCOUNTER — Telehealth: Payer: Self-pay | Admitting: *Deleted

## 2012-12-03 DIAGNOSIS — IMO0002 Reserved for concepts with insufficient information to code with codable children: Secondary | ICD-10-CM

## 2012-12-03 MED ORDER — HYDROXYZINE HCL 10 MG PO TABS: 10.0000 mg | ORAL_TABLET | Freq: Two times a day (BID) | ORAL | Status: DC

## 2012-12-03 MED ORDER — VENLAFAXINE HCL ER 150 MG PO CP24: 150.0000 mg | ORAL_CAPSULE | Freq: Every morning | ORAL | Status: AC

## 2012-12-03 MED ORDER — GABAPENTIN 300 MG PO CAPS: 300.0000 mg | ORAL_CAPSULE | Freq: Two times a day (BID) | ORAL | Status: DC

## 2012-12-03 NOTE — Telephone Encounter (Signed)
Spoke with pt advised Rx sent 

## 2012-12-03 NOTE — Telephone Encounter (Signed)
All rx's were refilled

## 2012-12-03 NOTE — Telephone Encounter (Signed)
Pt called states while she was hospitalized in November she missed her Mental Health appointment and her mental health MD left [facility].  Pt no longer has Effexor, Gabapentin, or Hydroxyzine.  Pt appoint with new Mental Health MD is 1.19.15.  Please advise

## 2012-12-04 ENCOUNTER — Encounter: Payer: Self-pay | Admitting: Physician Assistant

## 2012-12-04 ENCOUNTER — Ambulatory Visit (INDEPENDENT_AMBULATORY_CARE_PROVIDER_SITE_OTHER): Payer: Medicare Other | Admitting: Physician Assistant

## 2012-12-04 VITALS — BP 114/66 | HR 89 | Ht 69.0 in | Wt 142.4 lb

## 2012-12-04 DIAGNOSIS — I10 Essential (primary) hypertension: Secondary | ICD-10-CM

## 2012-12-04 DIAGNOSIS — I255 Ischemic cardiomyopathy: Secondary | ICD-10-CM

## 2012-12-04 DIAGNOSIS — N179 Acute kidney failure, unspecified: Secondary | ICD-10-CM

## 2012-12-04 DIAGNOSIS — J449 Chronic obstructive pulmonary disease, unspecified: Secondary | ICD-10-CM

## 2012-12-04 DIAGNOSIS — D509 Iron deficiency anemia, unspecified: Secondary | ICD-10-CM

## 2012-12-04 DIAGNOSIS — I5022 Chronic systolic (congestive) heart failure: Secondary | ICD-10-CM

## 2012-12-04 DIAGNOSIS — E785 Hyperlipidemia, unspecified: Secondary | ICD-10-CM

## 2012-12-04 DIAGNOSIS — I2510 Atherosclerotic heart disease of native coronary artery without angina pectoris: Secondary | ICD-10-CM

## 2012-12-04 DIAGNOSIS — R55 Syncope and collapse: Secondary | ICD-10-CM

## 2012-12-04 DIAGNOSIS — I2589 Other forms of chronic ischemic heart disease: Secondary | ICD-10-CM

## 2012-12-04 MED ORDER — POTASSIUM CHLORIDE CRYS ER 10 MEQ PO TBCR: 20.0000 meq | EXTENDED_RELEASE_TABLET | Freq: Two times a day (BID) | ORAL | Status: DC

## 2012-12-04 NOTE — Progress Notes (Signed)
[address] [place], [place]  [address] Phone: [phone] Fax:  [phone]  Date:  12/04/2012   ID:  [name], DOB 03/12/1945, MRN [id number]  PCP:  Thomas Jones, MD  Cardiologist:  Dr. James Hochrein   Electrophysiologist:  Dr. Steven Klein     History of Present Illness: [name] is a 67 y.o. 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  Myoview (4/14):  Inf and IL scar with peri-infarct ischemia, EF 14%.   Echo (4/14):  EF 40-45%.  LHC (4/14):  LM 20%, dLAD 99% (too small for PCI), mid and dist RCA stents patent, mRCA 40-50%, PDA 99% (small vessel), EF 20% => Med Rx.  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     She was admitted 11/18-11/22 with a left humeral fracture in the setting of syncope and fall. Syncopal episode occurred after feeling dizzy when she stood up from the commode. She had metabolic derangement noted with magnesium of 1.4 and potassium 2.9 in the setting of chronic hyponatremia (126-129). She was noted to have orthostatic intolerance. Metolazone was placed on hold. Carotid US (11/14):  1-39% bilat ICA.  Echocardiogram  (11/21/12): EF 10-15%, severe diffuse HK, moderate MR, moderate LAE, mild RVE, moderately reduced RVSF, moderate to severe RAE, moderate TR, PASP 56.  She was evaluated by Dr. Klein for electrophysiology.  She was placed in a sling by orthopedics with plans for eventual surgical correction of her left shoulder fracture. Electrophysiology recommended placing her on a LifeVest until recovery from her shoulder injury. She could then be set up for AICD implantation.  She was readmitted 11/28-11/30 with MSK type chest pain. Cardiac markers remained normal. Chest CTA was neg for pulmonary embolism.  She was seen by Dr. Crenshaw for cardiology. Given recent catheterization in 04/2012, no  further invasive workup was recommended. Medical therapy was continued. She was again noted to be hyponatremic. Her metolazone was discontinued.  She remains in significant pain from her shoulder. Her breathing is improved. She has occasional chest tightness with certain meals. She thinks it is gas. She denies any radiating symptoms. She denies exertional chest pain. She does note associated nausea, diaphoresis and dyspnea. She denies syncope. Her life vest has not fired.  Recent Labs: 08/22/2012: HDL 50.30; LDL (calc) 62  11/20/2012: ALT 11; TSH 2.427  11/29/2012: Pro B Natriuretic peptide (BNP) 8012.0*  12/01/2012: Hemoglobin 10.2*  12/02/2012: Creatinine 1.7*; Potassium 4.7   Wt Readings from Last 3 Encounters:  12/04/12 142 lb 6.4 oz (64.592 kg)  11/30/12 138 lb 14.2 oz (63 kg)  11/23/12 141 lb 12.1 oz (64.3 kg)     Past Medical History  Diagnosis Date  . DM   . DYSLIPIDEMIA   . OBESITY NOS   . DEPRESSION, RECURRENT, IN PARTIAL REMISSION   . HYPERTENSION   . CORONARY ARTERY DISEASE     a. s/p Q wave MI 2002 with RCA stent x 2. b. Abnl nuc 04/2012 with subsequent cath showing patent stents in RCA, chronic diffuse disease in dLAD, 20% LM, EF 20%, no focal targets for PCI.  . GERD   . DIVERTICULOSIS, COLON, WITH HEMORRHAGE   . BACK PAIN, LUMBAR, WITH RADICULOPATHY   . OBSTRUCTIVE SLEEP APNEA   . Anemia, iron deficiency   . Hyponatremia   .   Tobacco abuse   . Moderate COPD (chronic obstructive pulmonary disease) 11/07/2005    Spirometry 03/09/11>>FEV1 2.02 (88%), FEV1% 76    . Anxiety   . Arthritis   . Myocardial infarction 2001  . Dementia     a. Saw neuro 08/2012: concerning for early dementia, labs unremarkable except elev CRP.  . Chronic edema   . Weight loss     CT abd pelvis 05/2012: mild small bowel disention. No significant acute changes. Mod-severe gastritis by EGD 06/06/2012 no clear neoplastic changes. Neg small bowel follow through except for diverticulum on 06/11/12.    . Gastritis     a. by EGD 05/2012.  . Chronic systolic CHF (congestive heart failure)     a. Last EF 20% by cath 04/29/2012, 40-45% by echo 04/24/12.;  b.  Echocardiogram  (11/21/12): EF 10-15%, severe diffuse HK, moderate MR, moderate LAE, mild RVE, moderately reduced RVSF, moderate to severe RAE, moderate TR, PASP 56  . Ischemic cardiomyopathy   . Syncope     a. 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 BB discontinued. b. Recurrence in 11/2012.=> Life Vest placed pending ICD placmement     Current Outpatient Prescriptions  Medication Sig Dispense Refill  . acetaminophen (TYLENOL) 500 MG tablet Take 500 mg by mouth every 6 (six) hours as needed for pain.      . albuterol (PROVENTIL) (2.5 MG/3ML) 0.083% nebulizer solution Take 2.5 mg by nebulization every 6 (six) hours as needed for wheezing.      . aspirin 81 MG tablet Take 81 mg by mouth daily.        . atorvastatin (LIPITOR) 40 MG tablet Take 40 mg by mouth every morning.      . diclofenac sodium (VOLTAREN) 1 % GEL Apply 2 g topically 4 (four) times daily.      . feeding supplement (GLUCERNA SHAKE) LIQD Take 237 mLs by mouth as needed.       . furosemide (LASIX) 80 MG tablet Take 160 mg by mouth 2 (two) times daily.      . gabapentin (NEURONTIN) 300 MG capsule Take 1 capsule (300 mg total) by mouth 2 (two) times daily.  180 capsule  3  . hydrALAZINE (APRESOLINE) 50 MG tablet Take 50 mg by mouth every 8 (eight) hours.      . HYDROcodone-acetaminophen (NORCO/VICODIN) 5-325 MG per tablet Take 1-2 tablets by mouth every 4 (four) hours as needed for moderate pain.  40 tablet  0  . hydrOXYzine (ATARAX/VISTARIL) 10 MG tablet Take 1 tablet (10 mg total) by mouth 2 (two) times daily.  180 tablet  3  . Iron-Folic Acid-B12-C-Docusate (FERRAPLUS 90) 90-1 MG TABS Take 1 tablet by mouth daily.  90 tablet  3  . isosorbide mononitrate (IMDUR) 60 MG 24 hr tablet Take 60 mg by mouth daily at 12 noon.      . Lanc[employer] Devices (SIMPLE DIAGNOSTICS  LANCING DEV) MISC       . losartan (COZAAR) 50 MG tablet Take 1 tablet (50 mg total) by mouth daily.  90 tablet  3  . magnesium oxide (MAG-OX) 400 MG tablet Take 1 tablet (400 mg total) by mouth daily.  30 tablet  0  . nitroGLYCERIN (NITROSTAT) 0.4 MG SL tablet Place 1 tablet (0.4 mg total) under the tongue every 5 (five) minutes x 3 doses as needed for chest pain.  25 tablet  3  . omeprazole (PRILOSEC) 40 MG capsule Take 40 mg by mouth daily.      .   polyethylene glycol (MIRALAX / GLYCOLAX) packet Take 17 g by mouth daily as needed (for constipation).      . potassium chloride (K-DUR,KLOR-CON) 10 MEQ tablet Take 2 tablets (20 mEq total) by mouth 2 (two) times daily.      . sitaGLIPtan-metformin (JANUMET) 50-500 MG per tablet Take 1 tablet by mouth 2 (two) times daily with a meal.      . spironolactone (ALDACTONE) 25 MG tablet Take 1 tablet (25 mg total) by mouth daily.  30 tablet  6  . tiotropium (SPIRIVA) 18 MCG inhalation capsule Place 18 mcg into inhaler and inhale daily.      . traMADol (ULTRAM) 50 MG tablet Take 50-100 mg by mouth every 6 (six) hours as needed for moderate pain.      . traZODone (DESYREL) 100 MG tablet Take 150 mg by mouth at bedtime.      . venlafaxine XR (EFFEXOR-XR) 150 MG 24 hr capsule Take 1 capsule (150 mg total) by mouth every morning.  90 capsule  3  . [DISCONTINUED] budesonide-formoterol (SYMBICORT) 80-4.5 MCG/ACT inhaler Inhale 2 puffs into the lungs 2 (two) times daily.  1 Inhaler  12   No current facility-administered medications for this visit.    Allergies:   Amlodipine; Ramipril; Adhesive; Morphine and related; Penicillins; and Codeine   Social History:  The patient  reports that she quit smoking about 4 months ago. Her smoking use included Cigarettes. She has a 5 pack-year smoking history. She has never used smokeless tobacco. She reports that she does not drink alcohol or use illicit drugs.   Family History:  The patient's family history includes Alcohol  abuse in her brother; Breast cancer in her sister; Cancer in her sister; Colon cancer in an other family member; Emphysema in her sister; Prostate cancer in her brother; Stroke in her mother.   ROS:  Please see the history of present illness.   She has night sweats. She has dark stools from iron therapy. She had recent epistaxis.   All other systems reviewed and negative.   PHYSICAL EXAM: VS:  BP 114/66  Pulse 89  Ht 5' 9" (1.753 m)  Wt 142 lb 6.4 oz (64.592 kg)  BMI 21.02 kg/m2 Well nourished, well developed, in no acute distress HEENT: normal Neck: no JVDat 90 Cardiac:  normal S1, S2; RRR; no murmur Lungs:  Decreased breath sounds bilaterally, no wheezing, rhonchi or rales Abd: soft, nontender, no hepatomegaly Ext: no edema Skin: warm and dry Neuro:  CNs 2-12 intact, no focal abnormalities noted  EKG:  NSR, HR 89, first degree AV block (PR 246 ms), T wave inversions in 2, 3, aVF, V3-V6, inferior Q waves, no significant change since prior tracings     ASSESSMENT AND PLAN:  1. CAD: No angina. She is atypical chest pain that is likely related to asked reflux. Continue aspirin, statin. 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 Continue nitrates, ARB. 2. 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 Continue ARB, Spironolactone, nitrates, hydralazine.   3. Syncope: She continues to wear her Life Vest. Plan is to eventually undergo ICD implantation once her shoulder fracture has been treated. She will likely need a CRT device which would allow her to take a beta blocker. 4. Chronic Systolic CHF: Volume appears stable. She had blood work performed earlier this week. Her creatinine had increased. I will obtain a repeat basic metabolic   panel today. 5. Anemia: Continue followup with hematology. 6. COPD:  I have recommended that she stop smoking. 7. GERD: Continue PPI. 8. Hypertension: Controlled. 9. Hyperlipidemia:  Continue statin. 10. Disposition: Follow up with me in 3 weeks. I will arrange follow up with Dr. Klein in 2 months.  Signed, Juda Lajeunesse, PA-C  12/04/2012 2:10 PM   

## 2012-12-04 NOTE — Patient Instructions (Signed)
LAB TODAY TO BE DONE AT [facility] AT [address]  NO CHANGES WITH YOUR MEDICATIONS WERE MADE TODAY  PLEASE FOLLOW UP WITH SCOTT WEAVER, PAC IN 3 WEEKS SAME DAY [name] IS IN THE OFFICE  FOLLOW UP WITH DR. Graciela Husbands IN 2 MONTHS

## 2012-12-05 ENCOUNTER — Other Ambulatory Visit: Payer: Self-pay | Admitting: *Deleted

## 2012-12-05 ENCOUNTER — Encounter: Payer: Self-pay | Admitting: *Deleted

## 2012-12-05 ENCOUNTER — Telehealth: Payer: Self-pay | Admitting: Internal Medicine

## 2012-12-05 ENCOUNTER — Telehealth: Payer: Self-pay | Admitting: *Deleted

## 2012-12-05 DIAGNOSIS — Z01812 Encounter for preprocedural laboratory examination: Secondary | ICD-10-CM

## 2012-12-05 DIAGNOSIS — I5022 Chronic systolic (congestive) heart failure: Secondary | ICD-10-CM

## 2012-12-05 LAB — BASIC METABOLIC PANEL
BUN: 35 mg/dL — ABNORMAL HIGH (ref 6–23)
CO2: 30 mEq/L (ref 19–32)
Calcium: 9 mg/dL (ref 8.4–10.5)
Chloride: 87 mEq/L — ABNORMAL LOW (ref 96–112)
Creat: 1.33 mg/dL — ABNORMAL HIGH (ref 0.50–1.10)

## 2012-12-05 MED ORDER — FUROSEMIDE 80 MG PO TABS: ORAL_TABLET | ORAL | Status: DC

## 2012-12-05 NOTE — Telephone Encounter (Signed)
Pt & spouse notified of lab results. BMET to be repeated 12/9. Notified of lasix med changes & pt /husband voices understanding

## 2012-12-05 NOTE — Telephone Encounter (Signed)
New message    Returned a nurses call to schedule a procedure

## 2012-12-05 NOTE — Telephone Encounter (Signed)
Scheduled CRT-D for 12/16/12, lab work 12/10/12. Letter of instructions left at front desk to pick up next week when pt comes in for lab work.  Went over instructions, patient verbalized understanding and agreeable to plan.

## 2012-12-05 NOTE — Telephone Encounter (Signed)
A user error has taken place: encounter opened in error, closed for administrative reasons.

## 2012-12-09 ENCOUNTER — Encounter (HOSPITAL_COMMUNITY): Payer: Self-pay | Admitting: Pharmacy Technician

## 2012-12-10 ENCOUNTER — Other Ambulatory Visit

## 2012-12-10 ENCOUNTER — Telehealth: Payer: Self-pay

## 2012-12-10 ENCOUNTER — Telehealth: Payer: Self-pay | Admitting: *Deleted

## 2012-12-10 MED ORDER — AZITHROMYCIN 500 MG PO TABS: 500.0000 mg | ORAL_TABLET | Freq: Every day | ORAL | Status: DC

## 2012-12-10 NOTE — Telephone Encounter (Signed)
Try zpak  

## 2012-12-10 NOTE — Telephone Encounter (Signed)
Spoke with Teresse w/ Home health who states that she went out to see pt in the home today. Pt c/o cough, post nasal drip, green mucous and HA w/ sinus pressure. She would like to know if MD will send in Rx to help patient. Please advise Thanks

## 2012-12-10 NOTE — Telephone Encounter (Signed)
Informed patient of procedural time change for 12/16/12 - her CRT-D implant has been moved, she needs to be at hospital at 10:00 am for procedure at noon. I hand wrote new time on her instruction sheet and is at front desk for her to pick up. She is agreeable to plan.

## 2012-12-10 NOTE — Telephone Encounter (Signed)
The home health nurse called and wants to discuss the patient's drainage and headache with Dr.Jones assistant. I offered the pt an ov, but the home health nurse declined, stating she is hoping something can be called into a pharmacy....    Home health RN - Mordecai Maes) [phone]

## 2012-12-11 ENCOUNTER — Other Ambulatory Visit: Payer: Self-pay | Admitting: Medical Oncology

## 2012-12-11 ENCOUNTER — Telehealth: Payer: Self-pay | Admitting: Medical Oncology

## 2012-12-11 NOTE — Telephone Encounter (Signed)
Patient and nurse notified per MD

## 2012-12-11 NOTE — Telephone Encounter (Signed)
I told [name] that we did not order any labs to be drawn by [facility]. She said they are going to be discharging pt from [facility] because she is seeking aggressive treatment for her cardiac status.

## 2012-12-11 NOTE — Telephone Encounter (Signed)
[facility] asking what labs pt needs drawn -they can draw them . Dr Anne Fu asking about treatment intent. I called and left message for Teresse that Dr Arbutus Ped is treating pts anemia with iron, last dose in sept.

## 2012-12-12 ENCOUNTER — Encounter: Payer: Self-pay | Admitting: Cardiology

## 2012-12-13 ENCOUNTER — Other Ambulatory Visit: Payer: Medicare Other

## 2012-12-16 ENCOUNTER — Encounter (HOSPITAL_COMMUNITY)
Admission: RE | Disposition: A | Discharge status: Home Health | Payer: Self-pay | Source: Ambulatory Visit | Attending: Internal Medicine

## 2012-12-16 ENCOUNTER — Inpatient Hospital Stay (HOSPITAL_COMMUNITY)
Admission: RE | Admit: 2012-12-16 | Discharge: 2012-12-23 | DRG: 226 | Disposition: A | Discharge status: Home Health | Payer: Medicare Other | Source: Ambulatory Visit | Attending: Internal Medicine | Admitting: Internal Medicine

## 2012-12-16 ENCOUNTER — Other Ambulatory Visit: Payer: Self-pay | Admitting: Cardiology

## 2012-12-16 ENCOUNTER — Encounter (HOSPITAL_COMMUNITY): Payer: Self-pay | Admitting: General Practice

## 2012-12-16 DIAGNOSIS — J4489 Other specified chronic obstructive pulmonary disease: Secondary | ICD-10-CM | POA: Diagnosis present

## 2012-12-16 DIAGNOSIS — E119 Type 2 diabetes mellitus without complications: Secondary | ICD-10-CM | POA: Diagnosis present

## 2012-12-16 DIAGNOSIS — F329 Major depressive disorder, single episode, unspecified: Secondary | ICD-10-CM | POA: Diagnosis present

## 2012-12-16 DIAGNOSIS — J690 Pneumonitis due to inhalation of food and vomit: Secondary | ICD-10-CM | POA: Diagnosis not present

## 2012-12-16 DIAGNOSIS — D509 Iron deficiency anemia, unspecified: Secondary | ICD-10-CM | POA: Diagnosis present

## 2012-12-16 DIAGNOSIS — Z880 Allergy status to penicillin: Secondary | ICD-10-CM

## 2012-12-16 DIAGNOSIS — Z87891 Personal history of nicotine dependence: Secondary | ICD-10-CM

## 2012-12-16 DIAGNOSIS — Z9861 Coronary angioplasty status: Secondary | ICD-10-CM

## 2012-12-16 DIAGNOSIS — I509 Heart failure, unspecified: Secondary | ICD-10-CM | POA: Diagnosis present

## 2012-12-16 DIAGNOSIS — Z803 Family history of malignant neoplasm of breast: Secondary | ICD-10-CM

## 2012-12-16 DIAGNOSIS — I10 Essential (primary) hypertension: Secondary | ICD-10-CM | POA: Diagnosis present

## 2012-12-16 DIAGNOSIS — I428 Other cardiomyopathies: Principal | ICD-10-CM | POA: Diagnosis present

## 2012-12-16 DIAGNOSIS — I2510 Atherosclerotic heart disease of native coronary artery without angina pectoris: Secondary | ICD-10-CM | POA: Diagnosis present

## 2012-12-16 DIAGNOSIS — Z800 Family history of malignant neoplasm of digestive organs: Secondary | ICD-10-CM

## 2012-12-16 DIAGNOSIS — Z006 Encounter for examination for normal comparison and control in clinical research program: Secondary | ICD-10-CM

## 2012-12-16 DIAGNOSIS — Z7982 Long term (current) use of aspirin: Secondary | ICD-10-CM

## 2012-12-16 DIAGNOSIS — N183 Chronic kidney disease, stage 3 unspecified: Secondary | ICD-10-CM | POA: Diagnosis present

## 2012-12-16 DIAGNOSIS — Z79899 Other long term (current) drug therapy: Secondary | ICD-10-CM

## 2012-12-16 DIAGNOSIS — E1129 Type 2 diabetes mellitus with other diabetic kidney complication: Secondary | ICD-10-CM | POA: Diagnosis present

## 2012-12-16 DIAGNOSIS — Z6379 Other stressful life events affecting family and household: Secondary | ICD-10-CM

## 2012-12-16 DIAGNOSIS — N390 Urinary tract infection, site not specified: Secondary | ICD-10-CM | POA: Diagnosis present

## 2012-12-16 DIAGNOSIS — I959 Hypotension, unspecified: Secondary | ICD-10-CM | POA: Diagnosis present

## 2012-12-16 DIAGNOSIS — I5022 Chronic systolic (congestive) heart failure: Secondary | ICD-10-CM | POA: Diagnosis present

## 2012-12-16 DIAGNOSIS — S42309D Unspecified fracture of shaft of humerus, unspecified arm, subsequent encounter for fracture with routine healing: Secondary | ICD-10-CM

## 2012-12-16 DIAGNOSIS — Z95810 Presence of automatic (implantable) cardiac defibrillator: Secondary | ICD-10-CM

## 2012-12-16 DIAGNOSIS — I447 Left bundle-branch block, unspecified: Secondary | ICD-10-CM | POA: Diagnosis present

## 2012-12-16 DIAGNOSIS — Z889 Allergy status to unspecified drugs, medicaments and biological substances status: Secondary | ICD-10-CM

## 2012-12-16 DIAGNOSIS — N179 Acute kidney failure, unspecified: Secondary | ICD-10-CM | POA: Diagnosis not present

## 2012-12-16 DIAGNOSIS — E785 Hyperlipidemia, unspecified: Secondary | ICD-10-CM | POA: Diagnosis present

## 2012-12-16 DIAGNOSIS — I441 Atrioventricular block, second degree: Secondary | ICD-10-CM | POA: Diagnosis present

## 2012-12-16 DIAGNOSIS — F411 Generalized anxiety disorder: Secondary | ICD-10-CM | POA: Diagnosis present

## 2012-12-16 DIAGNOSIS — F3289 Other specified depressive episodes: Secondary | ICD-10-CM | POA: Diagnosis present

## 2012-12-16 DIAGNOSIS — R339 Retention of urine, unspecified: Secondary | ICD-10-CM | POA: Diagnosis not present

## 2012-12-16 DIAGNOSIS — R34 Anuria and oliguria: Secondary | ICD-10-CM | POA: Diagnosis not present

## 2012-12-16 DIAGNOSIS — E871 Hypo-osmolality and hyponatremia: Secondary | ICD-10-CM | POA: Diagnosis present

## 2012-12-16 DIAGNOSIS — F0390 Unspecified dementia without behavioral disturbance: Secondary | ICD-10-CM | POA: Diagnosis present

## 2012-12-16 DIAGNOSIS — I129 Hypertensive chronic kidney disease with stage 1 through stage 4 chronic kidney disease, or unspecified chronic kidney disease: Secondary | ICD-10-CM | POA: Diagnosis present

## 2012-12-16 DIAGNOSIS — Z823 Family history of stroke: Secondary | ICD-10-CM

## 2012-12-16 DIAGNOSIS — I252 Old myocardial infarction: Secondary | ICD-10-CM

## 2012-12-16 DIAGNOSIS — G4733 Obstructive sleep apnea (adult) (pediatric): Secondary | ICD-10-CM | POA: Diagnosis present

## 2012-12-16 DIAGNOSIS — K219 Gastro-esophageal reflux disease without esophagitis: Secondary | ICD-10-CM | POA: Diagnosis present

## 2012-12-16 DIAGNOSIS — J449 Chronic obstructive pulmonary disease, unspecified: Secondary | ICD-10-CM | POA: Diagnosis present

## 2012-12-16 HISTORY — PX: CARDIAC DEFIBRILLATOR PLACEMENT: SHX171

## 2012-12-16 HISTORY — DX: Presence of automatic (implantable) cardiac defibrillator: Z95.810

## 2012-12-16 HISTORY — PX: BI-VENTRICULAR IMPLANTABLE CARDIOVERTER DEFIBRILLATOR: SHX5459

## 2012-12-16 LAB — CBC
HCT: 28.3 % — ABNORMAL LOW (ref 36.0–46.0)
Hemoglobin: 9.7 g/dL — ABNORMAL LOW (ref 12.0–15.0)
MCH: 29.9 pg (ref 26.0–34.0)
MCHC: 34.3 g/dL (ref 30.0–36.0)
MCV: 87.3 fL (ref 78.0–100.0)
Platelets: 412 10*3/uL — ABNORMAL HIGH (ref 150–400)
RBC: 3.24 MIL/uL — ABNORMAL LOW (ref 3.87–5.11)
WBC: 6.4 10*3/uL (ref 4.0–10.5)

## 2012-12-16 LAB — SURGICAL PCR SCREEN: MRSA, PCR: NEGATIVE

## 2012-12-16 LAB — BASIC METABOLIC PANEL
BUN: 25 mg/dL — ABNORMAL HIGH (ref 6–23)
CO2: 28 mEq/L (ref 19–32)
Calcium: 9.2 mg/dL (ref 8.4–10.5)
Chloride: 83 mEq/L — ABNORMAL LOW (ref 96–112)
Creatinine, Ser: 1.22 mg/dL — ABNORMAL HIGH (ref 0.50–1.10)
GFR calc Af Amer: 52 mL/min — ABNORMAL LOW (ref >90)
Glucose, Bld: 111 mg/dL — ABNORMAL HIGH (ref 70–99)

## 2012-12-16 LAB — GLUCOSE, CAPILLARY

## 2012-12-16 SURGERY — BI-VENTRICULAR IMPLANTABLE CARDIOVERTER DEFIBRILLATOR  (CRT-D)
Anesthesia: LOCAL

## 2012-12-16 MED ORDER — SODIUM CHLORIDE 0.9 % IV SOLN: INTRAVENOUS | Status: AC

## 2012-12-16 MED ORDER — ONDANSETRON HCL 4 MG/2ML IJ SOLN
4.0000 mg | Freq: Four times a day (QID) | INTRAMUSCULAR | Status: DC | PRN
  Administered 2012-12-17 (×2): 4 mg via INTRAVENOUS
  Filled 2012-12-16 (×2): qty 2

## 2012-12-16 MED ORDER — MUPIROCIN 2 % EX OINT
TOPICAL_OINTMENT | CUTANEOUS | Status: AC
  Administered 2012-12-16: 1 via NASAL
  Filled 2012-12-16: qty 22

## 2012-12-16 MED ORDER — LABETALOL HCL 5 MG/ML IV SOLN
INTRAVENOUS | Status: AC
  Filled 2012-12-16: qty 4

## 2012-12-16 MED ORDER — LIVING WELL WITH DIABETES BOOK
Freq: Once | Status: AC
  Administered 2012-12-16: 22:00:00
  Filled 2012-12-16: qty 1

## 2012-12-16 MED ORDER — LIDOCAINE HCL (PF) 1 % IJ SOLN
INTRAMUSCULAR | Status: AC
  Filled 2012-12-16: qty 60

## 2012-12-16 MED ORDER — FENTANYL CITRATE 0.05 MG/ML IJ SOLN
INTRAMUSCULAR | Status: AC
  Filled 2012-12-16: qty 2

## 2012-12-16 MED ORDER — SODIUM CHLORIDE 0.9 % IV SOLN
INTRAVENOUS | Status: DC
  Administered 2012-12-16 (×2): via INTRAVENOUS

## 2012-12-16 MED ORDER — SODIUM CHLORIDE 0.9 % IR SOLN
80.0000 mg | Status: AC
  Administered 2012-12-16: 80 mg
  Filled 2012-12-16: qty 2

## 2012-12-16 MED ORDER — MUPIROCIN 2 % EX OINT
TOPICAL_OINTMENT | Freq: Two times a day (BID) | CUTANEOUS | Status: DC
  Administered 2012-12-16: 1 via NASAL
  Administered 2012-12-18 – 2012-12-22 (×5): via NASAL

## 2012-12-16 MED ORDER — VANCOMYCIN HCL IN DEXTROSE 1-5 GM/200ML-% IV SOLN
1000.0000 mg | Freq: Two times a day (BID) | INTRAVENOUS | Status: AC
  Administered 2012-12-16: 1000 mg via INTRAVENOUS
  Filled 2012-12-16: qty 200

## 2012-12-16 MED ORDER — MUPIROCIN 2 % EX OINT
1.0000 "application " | TOPICAL_OINTMENT | Freq: Two times a day (BID) | CUTANEOUS | Status: AC
  Administered 2012-12-16 – 2012-12-21 (×10): 1 via NASAL
  Filled 2012-12-16: qty 22

## 2012-12-16 MED ORDER — TRAMADOL HCL 50 MG PO TABS
50.0000 mg | ORAL_TABLET | Freq: Three times a day (TID) | ORAL | Status: DC | PRN
  Administered 2012-12-16 – 2012-12-20 (×7): 50 mg via ORAL
  Filled 2012-12-16 (×7): qty 1

## 2012-12-16 MED ORDER — ACETAMINOPHEN 325 MG PO TABS
325.0000 mg | ORAL_TABLET | ORAL | Status: DC | PRN
  Administered 2012-12-16 (×3): 325 mg via ORAL
  Administered 2012-12-17 – 2012-12-19 (×2): 650 mg via ORAL
  Filled 2012-12-16 (×6): qty 2

## 2012-12-16 MED ORDER — VANCOMYCIN HCL IN DEXTROSE 1-5 GM/200ML-% IV SOLN
1000.0000 mg | INTRAVENOUS | Status: AC
  Administered 2012-12-16: 1000 mg via INTRAVENOUS
  Filled 2012-12-16: qty 200

## 2012-12-16 MED ORDER — LIDOCAINE HCL (PF) 1 % IJ SOLN
INTRAMUSCULAR | Status: AC
  Filled 2012-12-16: qty 30

## 2012-12-16 MED ORDER — CHLORHEXIDINE GLUCONATE 4 % EX LIQD: 60.0000 mL | Freq: Once | CUTANEOUS | Status: DC

## 2012-12-16 MED ORDER — YOU HAVE A PACEMAKER BOOK
Freq: Once | Status: AC
  Administered 2012-12-16: 22:00:00
  Filled 2012-12-16: qty 1

## 2012-12-16 MED ORDER — CARVEDILOL 12.5 MG PO TABS
12.5000 mg | ORAL_TABLET | Freq: Two times a day (BID) | ORAL | Status: DC
  Administered 2012-12-16 – 2012-12-18 (×4): 12.5 mg via ORAL
  Filled 2012-12-16 (×5): qty 1

## 2012-12-16 MED ORDER — MIDAZOLAM HCL 5 MG/5ML IJ SOLN
INTRAMUSCULAR | Status: AC
  Filled 2012-12-16: qty 5

## 2012-12-16 MED ORDER — CHLORHEXIDINE GLUCONATE CLOTH 2 % EX PADS
6.0000 | MEDICATED_PAD | Freq: Every day | CUTANEOUS | Status: AC
  Administered 2012-12-16 – 2012-12-20 (×5): 6 via TOPICAL

## 2012-12-16 MED ORDER — SODIUM CHLORIDE 0.9 % IV SOLN: INTRAVENOUS | Status: DC

## 2012-12-16 NOTE — CV Procedure (Signed)
[name] [id number]  [id number]  Preop Dx: NICM  Betablocker high grade heart block and syncope Postop Dx same/   Procedure:dual chamber ICD LV lead placement; lead assessment  Cx: None   Dictation number [id number]  Sherryl Manges, MD 12/16/2012 2:42 PM

## 2012-12-16 NOTE — Discharge Summary (Signed)
DISCHARGE SUMMARY    Patient ID: [name],  MRN: [id number], DOB/AGE: 05/08/1945 67 y.o.  Admit date: 12/16/2012 Discharge date: 12/18/2012  Primary Care Physician: [name], MD Primary Cardiologist: [name], MD Electrophysiologist: Sherryl Manges, MD  Primary Discharge Diagnosis:  Ischemic cardiomyopathy and syncope with LBBB s/p CRTD implant this admission  Secondary Discharge Diagnosis:  1.  Coronary artery disease - s/p Q wave MI 2002 with RCA stent x 2. b. Abnl nuc 04/2012 with subsequent cath showing patent stents in RCA, chronic diffuse disease in dLAD, 20% LM, EF 20%, no focal targets for PCI.  2.  Hypoxia 3. Deconditioning  **HHRN/PT/Aide @ discharge.  **Has deferred SNF admission. 4. Aspiration 5. Possible Pneumonia  **1 more day of Azithromycin therapy required at discharge. This was started prior to admission. 6.  Hypertension 7.  Diabetes - type II 8.  Dyslipidemia 9.  COPD 10.  Mobitz II heart block  11.  Anemia 12. GERD 13. Acute on chronic stage III CKD 14. Urinary retention  Allergies  Allergen Reactions  . Amlodipine Other (See Comments)    constipation  . Ramipril Cough  . Adhesive [Tape] Hives    "peels skin off"  . Morphine And Related Other (See Comments)    Per Husband, Pt has hallucinations  . Penicillins Itching  . Codeine Rash   Procedures This Admission:  1.  Implantation of a CRTD on 12-16-2012 by Dr Graciela Husbands. See his op note for full details. There were no immediate post procedure complications. 2.  CXR on 12-17-2012 demonstrated no pneumothorax status post device implantation.   Brief HPI: [name] is a 67 y.o. female with a past medical history as outlined above.  She had a syncopal spell in November of 2014 and suffered a left humeral fracture at that time.  With known cardiomyopathy, prior Mobitz II heart block and LBBB, CRTD implantation was recommended.  It was felt best to allow her to recover from  her shoulder fracture while wearing a lifevest and then present electively for CRTD implantation. Risks, benefits, and alternatives to ICD implantation were reviewed with the patient who wished to proceed.   Hospital Course:  The patient was admitted and underwent implantation of a CRTD with details as outlined above.   She was monitored on telemetry overnight which demonstrated sinus rhythm with ventricular pacing.  Left chest was without hematoma or ecchymosis.  The device was interrogated and found to be functioning normally.  CXR was obtained and demonstrated no pneumothorax status post device implantation.  Prior to discharge the day after procedure, she developed hypotension and nausea and thus discharge was cancelled.  This occurred in the setting of choking on food.  CXR was performed and showed worsening pulmonary edema and pleural effusions.  Beta blocker, arb, and diuretic therapy were held.  She reported the urge to urinate, though bladder scan revealed only 40 ml.  F/U labs revealed hyponatremia with a serum sodium of 119.  Normal saline infusion was initiated.  She remained oliguric and a foley catheter was placed.  UA did not show evidence of urinary tract infection.   With hydration, her blood pressure improved, as did sodium.  We were able to resume coreg @ 6.25mg  bid, lasix, nitrate, ARB, and spironolactone therapy.  Unfortunately however, she continued to feel weak.  Physical therapy was consulted and recommended skilled nursing facility placement.  Social work was consulted and efforts were made to find a SNF bed.  She remained stable over the weekend and this morning, she has expressed her wishes to be discharged to home with home health services.  Case management has arranged for HHRN/PT/Aide through [facility] and we plan to discharge Ms. Holloway today with plans for early follow-up and repeat bmet next week.    Discharge Vitals: Blood pressure 110/81, pulse 80, temperature  97.3 F (36.3 C), temperature source Oral, resp. rate 18, height 5\' 9"  (1.753 m), weight 141 lb 8.6 oz (64.2 kg), SpO2 97.00%.    Labs:   Lab Results  Component Value Date   WBC 6.4 12/16/2012   HGB 9.7* 12/16/2012   HCT 28.3* 12/16/2012   MCV 87.3 12/16/2012   PLT 412* 12/16/2012     Recent Labs Lab 12/16/12 1008  NA 125*  K 4.4  CL 83*  CO2 28  BUN 25*  CREATININE 1.22*  CALCIUM 9.2  GLUCOSE 111*     Discharge Medications:    Medication List         acetaminophen 500 MG tablet  Commonly known as:  TYLENOL  Take 500 mg by mouth every 6 (six) hours as needed for pain.     albuterol (2.5 MG/3ML) 0.083% nebulizer solution  Commonly known as:  PROVENTIL  Take 2.5 mg by nebulization every 6 (six) hours as needed for wheezing.     aspirin 81 MG tablet  Take 81 mg by mouth daily.     atorvastatin 40 MG tablet  Commonly known as:  LIPITOR  Take 40 mg by mouth every morning.     azithromycin 500 MG tablet  Commonly known as:  ZITHROMAX  Take 1 tablet (500 mg total) by mouth daily.     carvedilol 12.5 MG tablet  Commonly known as:  COREG  Take 1 tablet (12.5 mg total) by mouth 2 (two) times daily with a meal.     diclofenac sodium 1 % Gel  Commonly known as:  VOLTAREN  Apply 2 g topically 4 (four) times daily.     feeding supplement (GLUCERNA SHAKE) Liqd  Take 237 mLs by mouth as needed.     FERRAPLUS 90 90-1 MG Tabs  Take 1 tablet by mouth daily.     furosemide 80 MG tablet  Commonly known as:  LASIX  Take 1 tablet (80 mg total) by mouth 2 (two) times daily.     gabapentin 300 MG capsule  Commonly known as:  NEURONTIN  Take 1 capsule (300 mg total) by mouth 2 (two) times daily.     hydrALAZINE 25 MG tablet  Commonly known as:  APRESOLINE  Take 1 tablet (25 mg total) by mouth every 8 (eight) hours.     HYDROcodone-acetaminophen 5-325 MG per tablet  Commonly known as:  NORCO/VICODIN  Take 1-2 tablets by mouth every 4 (four) hours as needed for  moderate pain.     hydrOXYzine 10 MG tablet  Commonly known as:  ATARAX/VISTARIL  Take 1 tablet (10 mg total) by mouth 2 (two) times daily.     isosorbide mononitrate 60 MG 24 hr tablet  Commonly known as:  IMDUR  Take 60 mg by mouth daily at 12 noon.     losartan 50 MG tablet  Commonly known as:  COZAAR  Take 1 tablet (50 mg total) by mouth daily.     magnesium oxide 400 MG tablet  Commonly known as:  MAG-OX  Take 1 tablet (400 mg total) by mouth daily.     nitroGLYCERIN 0.4  MG SL tablet  Commonly known as:  NITROSTAT  Place 1 tablet (0.4 mg total) under the tongue every 5 (five) minutes x 3 doses as needed for chest pain.     omeprazole 20 MG capsule  Commonly known as:  PRILOSEC  TAKE 1 CAPSULE BY MOUTH ONCE DAILY     omeprazole 40 MG capsule  Commonly known as:  PRILOSEC  Take 40 mg by mouth daily.     polyethylene glycol packet  Commonly known as:  MIRALAX / GLYCOLAX  Take 17 g by mouth daily as needed (for constipation).     potassium chloride 10 MEQ tablet  Commonly known as:  K-DUR,KLOR-CON  Take 2 tablets (20 mEq total) by mouth 2 (two) times daily.     SIMPLE DIAGNOSTICS LANCING DEV Misc     sitaGLIPtin-metformin 50-500 MG per tablet  Commonly known as:  JANUMET  Take 1 tablet by mouth 2 (two) times daily with a meal.     spironolactone 25 MG tablet  Commonly known as:  ALDACTONE  Take 1 tablet (25 mg total) by mouth daily.     tiotropium 18 MCG inhalation capsule  Commonly known as:  SPIRIVA  Place 18 mcg into inhaler and inhale daily.     traMADol 50 MG tablet  Commonly known as:  ULTRAM  Take 50-100 mg by mouth every 6 (six) hours as needed for moderate pain.     traZODone 100 MG tablet  Commonly known as:  DESYREL  Take 150 mg by mouth at bedtime.     venlafaxine XR 150 MG 24 hr capsule  Commonly known as:  EFFEXOR-XR  Take 1 capsule (150 mg total) by mouth every morning.        Disposition:   Future Appointments Provider Department  Dept Phone   12/31/2012 8:30 AM Herby Abraham Beverly Sessions [facility] [phone]   02/14/2013 12:00 PM Huston Foley, MD [facility] [phone]   03/18/2013 2:45 PM Duke Salvia, MD [facility] [place] Office [phone]     Follow-up Information   Follow up with Rick Duff, PA-C On 12/31/2012. (At 8:30 AM for wound check)    Specialty:  Cardiology   Contact information:   [address] [place] [address] [phone]       Follow up with Sherryl Manges, MD On 03/18/2013. (At 2:45 PM )    Specialty:  Cardiology   Contact information:   [address] [place] [address] [phone]       Duration of Discharge Encounter: Greater than 30 minutes including physician time.  Hettie Holstein 3:49 PM 12/23/2012

## 2012-12-16 NOTE — H&P (View-Only) (Signed)
[address] [place], [place]  [address] Phone: [phone] Fax:  [phone]  Date:  12/04/2012   ID:  [name] 08/01/1945, MRN [id number]  PCP:  [name], MD  Cardiologist:  Dr. [name]   Electrophysiologist:  Dr. Sherryl Manges     History of Present Illness: [name] is a 66 y.o. 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  Myoview (4/14):  Inf and IL scar with peri-infarct ischemia, EF 14%.   Echo (4/14):  EF 40-45%.  LHC (4/14):  LM 20%, dLAD 99% (too small for PCI), mid and dist RCA stents patent, mRCA 40-50%, PDA 99% (small vessel), EF 20% => Med Rx.  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     She was admitted 11/18-11/22 with a left humeral fracture in the setting of syncope and fall. Syncopal episode occurred after feeling dizzy when she stood up from the commode. She had metabolic derangement noted with magnesium of 1.4 and potassium 2.9 in the setting of chronic hyponatremia (126-129). She was noted to have orthostatic intolerance. Metolazone was placed on hold. Carotid US (11/14):  1-39% bilat ICA.  Echocardiogram  (11/21/12): EF 10-15%, severe diffuse HK, moderate MR, moderate LAE, mild RVE, moderately reduced RVSF, moderate to severe RAE, moderate TR, PASP 56.  She was evaluated by Dr. Graciela Husbands for electrophysiology.  She was placed in a sling by orthopedics with plans for eventual surgical correction of her left shoulder fracture. Electrophysiology recommended placing her on a LifeVest until recovery from her shoulder injury. She could then be set up for AICD implantation.  She was readmitted 11/28-11/30 with MSK type chest pain. Cardiac markers remained normal. Chest CTA was neg for pulmonary embolism.  She was seen by Dr. Jens Som for cardiology. Given recent catheterization in 04/2012, no  further invasive workup was recommended. Medical therapy was continued. She was again noted to be hyponatremic. Her metolazone was discontinued.  She remains in significant pain from her shoulder. Her breathing is improved. She has occasional chest tightness with certain meals. She thinks it is gas. She denies any radiating symptoms. She denies exertional chest pain. She does note associated nausea, diaphoresis and dyspnea. She denies syncope. Her life vest has not fired.  Recent Labs: 08/22/2012: HDL 50.30; LDL (calc) 62  44/01/270: ALT 11; TSH 2.427  11/29/2012: Pro B Natriuretic peptide (BNP) 8012.0*  12/01/2012: Hemoglobin 10.2*  12/02/2012: Creatinine 1.7*; Potassium 4.7   Wt Readings from Last 3 Encounters:  12/04/12 142 lb 6.4 oz (64.592 kg)  11/30/12 138 lb 14.2 oz (63 kg)  11/23/12 141 lb 12.1 oz (64.3 kg)     Past Medical History  Diagnosis Date  . DM   . DYSLIPIDEMIA   . OBESITY NOS   . DEPRESSION, RECURRENT, IN PARTIAL REMISSION   . HYPERTENSION   . CORONARY ARTERY DISEASE     a. s/p Q wave MI 2002 with RCA stent x 2. b. Abnl nuc 04/2012 with subsequent cath showing patent stents in RCA, chronic diffuse disease in dLAD, 20% LM, EF 20%, no focal targets for PCI.  [name] GERD   . DIVERTICULOSIS, COLON, WITH HEMORRHAGE   . BACK PAIN, LUMBAR, WITH RADICULOPATHY   . OBSTRUCTIVE SLEEP APNEA   . Anemia, iron deficiency   . Hyponatremia   .  Tobacco abuse   . Moderate COPD (chronic obstructive pulmonary disease) 11/07/2005    Spirometry 03/09/11>>FEV1 2.02 (88%), FEV1% 76    . Anxiety   . Arthritis   . Myocardial infarction 2001  . Dementia     a. Saw neuro 08/2012: concerning for early dementia, labs unremarkable except elev CRP.  [name] Chronic edema   . Weight loss     CT abd pelvis 05/2012: mild small bowel disention. No significant acute changes. Mod-severe gastritis by EGD 06/06/2012 no clear neoplastic changes. Neg small bowel follow through except for diverticulum on 06/11/12.    . Gastritis     a. by EGD 05/2012.  [name] Chronic systolic CHF (congestive heart failure)     a. Last EF 20% by cath 04/29/2012, 40-45% by echo 04/24/12.;  b.  Echocardiogram  (11/21/12): EF 10-15%, severe diffuse HK, moderate MR, moderate LAE, mild RVE, moderately reduced RVSF, moderate to severe RAE, moderate TR, PASP 56  . Ischemic cardiomyopathy   . Syncope     a. 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 BB discontinued. b. Recurrence in 11/2012.=> Life Vest placed pending ICD placmement     Current Outpatient Prescriptions  Medication Sig Dispense Refill  . acetaminophen (TYLENOL) 500 MG tablet Take 500 mg by mouth every 6 (six) hours as needed for pain.      [name] albuterol (PROVENTIL) (2.5 MG/3ML) 0.083% nebulizer solution Take 2.5 mg by nebulization every 6 (six) hours as needed for wheezing.      [name] aspirin 81 MG tablet Take 81 mg by mouth daily.        [name] atorvastatin (LIPITOR) 40 MG tablet Take 40 mg by mouth every morning.      . diclofenac sodium (VOLTAREN) 1 % GEL Apply 2 g topically 4 (four) times daily.      . feeding supplement (GLUCERNA SHAKE) LIQD Take 237 mLs by mouth as needed.       . furosemide (LASIX) 80 MG tablet Take 160 mg by mouth 2 (two) times daily.      [name] gabapentin (NEURONTIN) 300 MG capsule Take 1 capsule (300 mg total) by mouth 2 (two) times daily.  180 capsule  3  . hydrALAZINE (APRESOLINE) 50 MG tablet Take 50 mg by mouth every 8 (eight) hours.      [name] HYDROcodone-acetaminophen (NORCO/VICODIN) 5-325 MG per tablet Take 1-2 tablets by mouth every 4 (four) hours as needed for moderate pain.  40 tablet  0  . hydrOXYzine (ATARAX/VISTARIL) 10 MG tablet Take 1 tablet (10 mg total) by mouth 2 (two) times daily.  180 tablet  3  . Iron-Folic Acid-B12-C-Docusate (FERRAPLUS 90) 90-1 MG TABS Take 1 tablet by mouth daily.  90 tablet  3  . isosorbide mononitrate (IMDUR) 60 MG 24 hr tablet Take 60 mg by mouth daily at 12 noon.      [employer] (SIMPLE DIAGNOSTICS  LANCING DEV) MISC       . losartan (COZAAR) 50 MG tablet Take 1 tablet (50 mg total) by mouth daily.  90 tablet  3  . magnesium oxide (MAG-OX) 400 MG tablet Take 1 tablet (400 mg total) by mouth daily.  30 tablet  0  . nitroGLYCERIN (NITROSTAT) 0.4 MG SL tablet Place 1 tablet (0.4 mg total) under the tongue every 5 (five) minutes x 3 doses as needed for chest pain.  25 tablet  3  . omeprazole (PRILOSEC) 40 MG capsule Take 40 mg by mouth daily.      [name]  polyethylene glycol (MIRALAX / GLYCOLAX) packet Take 17 g by mouth daily as needed (for constipation).      . potassium chloride (K-DUR,KLOR-CON) 10 MEQ tablet Take 2 tablets (20 mEq total) by mouth 2 (two) times daily.      . sitaGLIPtan-metformin (JANUMET) 50-500 MG per tablet Take 1 tablet by mouth 2 (two) times daily with a meal.      . spironolactone (ALDACTONE) 25 MG tablet Take 1 tablet (25 mg total) by mouth daily.  30 tablet  6  . tiotropium (SPIRIVA) 18 MCG inhalation capsule Place 18 mcg into inhaler and inhale daily.      . traMADol (ULTRAM) 50 MG tablet Take 50-100 mg by mouth every 6 (six) hours as needed for moderate pain.      . traZODone (DESYREL) 100 MG tablet Take 150 mg by mouth at bedtime.      [name] venlafaxine XR (EFFEXOR-XR) 150 MG 24 hr capsule Take 1 capsule (150 mg total) by mouth every morning.  90 capsule  3  . [DISCONTINUED] budesonide-formoterol (SYMBICORT) 80-4.5 MCG/ACT inhaler Inhale 2 puffs into the lungs 2 (two) times daily.  1 Inhaler  12   No current facility-administered medications for this visit.    Allergies:   Amlodipine; Ramipril; Adhesive; Morphine and related; Penicillins; and Codeine   Social History:  The patient  reports that she quit smoking about 4 months ago. Her smoking use included Cigarettes. She has a 5 pack-year smoking history. She has never used smokeless tobacco. She reports that she does not drink alcohol or use illicit drugs.   Family History:  The patient's family history includes Alcohol  abuse in her brother; Breast cancer in her sister; Cancer in her sister; Colon cancer in an other family member; Emphysema in her sister; Prostate cancer in her brother; Stroke in her mother.   ROS:  Please see the history of present illness.   She has night sweats. She has dark stools from iron therapy. She had recent epistaxis.   All other systems reviewed and negative.   PHYSICAL EXAM: VS:  BP 114/66  Pulse 89  Ht 5\' 9"  (1.753 m)  Wt 142 lb 6.4 oz (64.592 kg)  BMI 21.02 kg/m2 Well nourished, well developed, in no acute distress HEENT: normal Neck: no JVDat 90 Cardiac:  normal S1, S2; RRR; no murmur Lungs:  Decreased breath sounds bilaterally, no wheezing, rhonchi or rales Abd: soft, nontender, no hepatomegaly Ext: no edema Skin: warm and dry Neuro:  CNs 2-12 intact, no focal abnormalities noted  EKG:  NSR, HR 89, first degree AV block (PR 246 ms), T wave inversions in 2, 3, aVF, V3-V6, inferior Q waves, no significant change since prior tracings     ASSESSMENT AND PLAN:  1. CAD: No angina. She is atypical chest pain that is likely related to asked reflux. Continue aspirin, statin. 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 Continue nitrates, ARB. 2. 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 Continue ARB, Spironolactone, nitrates, hydralazine.   3. Syncope: She continues to wear her Life Vest. Plan is to eventually undergo ICD implantation once her shoulder fracture has been treated. She will likely need a CRT device which would allow her to take a beta blocker. 4. Chronic Systolic CHF: Volume appears stable. She had blood work performed earlier this week. Her creatinine had increased. I will obtain a repeat basic metabolic  panel today. 5. Anemia: Continue followup with hematology. 6. COPD:  I have recommended that she stop smoking. 7. GERD: Continue PPI. 8. Hypertension: Controlled. 9. Hyperlipidemia:  Continue statin. 10. Disposition: Follow up with me in 3 weeks. I will arrange follow up with Dr. Graciela Husbands in 2 months.  Signed, Tereso Newcomer, PA-C  12/04/2012 2:10 PM

## 2012-12-16 NOTE — Progress Notes (Signed)
Lab results called to Wynonia Lawman, R.N.

## 2012-12-16 NOTE — Interval H&P Note (Signed)
ICD Criteria  Current LVEF (within 6 months):15.%  NYHA Functional Classification: Class II  Heart Failure History:  Yes, Duration of heart failure since onset is 3 to 9 months  Non-Ischemic Dilated Cardiomyopathy History:  Yes, timeframe is 3 to 9 months  Atrial Fibrillation/Atrial Flutter:  No.  Ventricular Tachycardia History:  No.  Cardiac Arrest History:  No  History of Syndromes with Risk of Sudden Death:  No.  Previous ICD:  No.  Electrophysiology Study: No.  Prior MI: No.  PPM: No.  OSA:  No  Patient Life Expectancy of >=1 year: Yes.  Anticoagulation Therapy:  Patient is NOT on anticoagulation therapy.   Beta Blocker Therapy:  No, Reason not on Beta Blocker therapy: heart block  Ace Inhibitor/ARB Therapy:  Yes.History and Physical Interval Note:  12/16/2012 10:36 AM  [name]  has presented today for surgery, with the diagnosis of hf  The various methods of treatment have been discussed with the patient and family. After consideration of risks, benefits and other options for treatment, the patient has consented to  Procedure(s): BI-VENTRICULAR IMPLANTABLE CARDIOVERTER DEFIBRILLATOR  (CRT-D) (N/A) as a surgical intervention .  The patient's history has been reviewed, patient examined, no change in status, stable for surgery.  I have reviewed the patient's chart and labs.  Questions were answered to the patient's satisfaction.     Sherryl Manges

## 2012-12-17 ENCOUNTER — Ambulatory Visit: Payer: Medicare Other | Admitting: Physician Assistant

## 2012-12-17 ENCOUNTER — Other Ambulatory Visit: Payer: Medicare Other | Admitting: Lab

## 2012-12-17 ENCOUNTER — Ambulatory Visit (HOSPITAL_COMMUNITY): Payer: Medicare Other

## 2012-12-17 LAB — GLUCOSE, CAPILLARY
Glucose-Capillary: 116 mg/dL — ABNORMAL HIGH (ref 70–99)
Glucose-Capillary: 138 mg/dL — ABNORMAL HIGH (ref 70–99)
Glucose-Capillary: 131 mg/dL — ABNORMAL HIGH (ref 70–99)

## 2012-12-17 MED ORDER — FUROSEMIDE 80 MG PO TABS: 120.0000 mg | ORAL_TABLET | Freq: Two times a day (BID) | ORAL | Status: DC

## 2012-12-17 MED ORDER — TIOTROPIUM BROMIDE MONOHYDRATE 18 MCG IN CAPS
18.0000 ug | ORAL_CAPSULE | Freq: Every day | RESPIRATORY_TRACT | Status: DC
  Administered 2012-12-18 – 2012-12-22 (×5): 18 ug via RESPIRATORY_TRACT
  Filled 2012-12-17 (×2): qty 5

## 2012-12-17 MED ORDER — LOSARTAN POTASSIUM 50 MG PO TABS
50.0000 mg | ORAL_TABLET | Freq: Every day | ORAL | Status: DC
  Administered 2012-12-17 – 2012-12-23 (×7): 50 mg via ORAL
  Filled 2012-12-17 (×7): qty 1

## 2012-12-17 MED ORDER — ASPIRIN 81 MG PO TABS: 81.0000 mg | ORAL_TABLET | Freq: Every day | ORAL | Status: DC

## 2012-12-17 MED ORDER — NITROGLYCERIN 0.4 MG SL SUBL
0.4000 mg | SUBLINGUAL_TABLET | SUBLINGUAL | Status: DC | PRN
  Administered 2012-12-22 (×2): 0.4 mg via SUBLINGUAL
  Filled 2012-12-17: qty 25

## 2012-12-17 MED ORDER — SODIUM CHLORIDE 0.9 % IV BOLUS (SEPSIS)
250.0000 mL | Freq: Once | INTRAVENOUS | Status: AC
  Administered 2012-12-17: 250 mL via INTRAVENOUS

## 2012-12-17 MED ORDER — PANTOPRAZOLE SODIUM 40 MG PO TBEC
40.0000 mg | DELAYED_RELEASE_TABLET | Freq: Every day | ORAL | Status: DC
  Administered 2012-12-17 – 2012-12-23 (×7): 40 mg via ORAL
  Filled 2012-12-17 (×7): qty 1

## 2012-12-17 MED ORDER — AZITHROMYCIN 500 MG PO TABS
500.0000 mg | ORAL_TABLET | Freq: Every day | ORAL | Status: DC
  Administered 2012-12-17 – 2012-12-23 (×7): 500 mg via ORAL
  Filled 2012-12-17 (×8): qty 1

## 2012-12-17 MED ORDER — ALUM & MAG HYDROXIDE-SIMETH 200-200-20 MG/5ML PO SUSP
30.0000 mL | ORAL | Status: DC | PRN
  Administered 2012-12-17: 30 mL via ORAL
  Filled 2012-12-17: qty 30

## 2012-12-17 MED ORDER — ASPIRIN 81 MG PO CHEW
81.0000 mg | CHEWABLE_TABLET | Freq: Every day | ORAL | Status: DC
  Administered 2012-12-17 – 2012-12-23 (×7): 81 mg via ORAL
  Filled 2012-12-17 (×7): qty 1

## 2012-12-17 MED ORDER — VENLAFAXINE HCL ER 150 MG PO CP24
150.0000 mg | ORAL_CAPSULE | Freq: Every morning | ORAL | Status: DC
  Administered 2012-12-17 – 2012-12-23 (×7): 150 mg via ORAL
  Filled 2012-12-17 (×7): qty 1

## 2012-12-17 MED ORDER — HYDROXYZINE HCL 10 MG PO TABS
10.0000 mg | ORAL_TABLET | Freq: Two times a day (BID) | ORAL | Status: DC
  Administered 2012-12-17 – 2012-12-23 (×13): 10 mg via ORAL
  Filled 2012-12-17 (×14): qty 1

## 2012-12-17 MED ORDER — FUROSEMIDE 80 MG PO TABS
120.0000 mg | ORAL_TABLET | ORAL | Status: DC
  Administered 2012-12-17: 120 mg via ORAL
  Filled 2012-12-17 (×2): qty 1

## 2012-12-17 MED ORDER — GABAPENTIN 300 MG PO CAPS
300.0000 mg | ORAL_CAPSULE | Freq: Two times a day (BID) | ORAL | Status: DC
  Administered 2012-12-17 – 2012-12-23 (×13): 300 mg via ORAL
  Filled 2012-12-17 (×14): qty 1

## 2012-12-17 MED ORDER — CARVEDILOL 12.5 MG PO TABS: 12.5000 mg | ORAL_TABLET | Freq: Two times a day (BID) | ORAL | Status: DC

## 2012-12-17 MED ORDER — ALBUTEROL SULFATE (5 MG/ML) 0.5% IN NEBU
2.5000 mg | INHALATION_SOLUTION | Freq: Four times a day (QID) | RESPIRATORY_TRACT | Status: DC | PRN
  Filled 2012-12-17: qty 0.5

## 2012-12-17 MED ORDER — SPIRONOLACTONE 25 MG PO TABS
25.0000 mg | ORAL_TABLET | Freq: Every day | ORAL | Status: DC
  Administered 2012-12-17 – 2012-12-23 (×7): 25 mg via ORAL
  Filled 2012-12-17 (×7): qty 1

## 2012-12-17 MED ORDER — FUROSEMIDE 80 MG PO TABS
160.0000 mg | ORAL_TABLET | ORAL | Status: DC
  Administered 2012-12-18: 160 mg via ORAL
  Filled 2012-12-17 (×2): qty 2

## 2012-12-17 MED ORDER — TRAZODONE HCL 150 MG PO TABS
150.0000 mg | ORAL_TABLET | Freq: Every day | ORAL | Status: DC
  Administered 2012-12-17 – 2012-12-22 (×5): 150 mg via ORAL
  Filled 2012-12-17 (×7): qty 1

## 2012-12-17 MED ORDER — ISOSORBIDE MONONITRATE ER 60 MG PO TB24
60.0000 mg | ORAL_TABLET | Freq: Every day | ORAL | Status: DC
  Administered 2012-12-17: 60 mg via ORAL
  Filled 2012-12-17 (×2): qty 1

## 2012-12-17 MED ORDER — ATORVASTATIN CALCIUM 40 MG PO TABS
40.0000 mg | ORAL_TABLET | Freq: Every day | ORAL | Status: DC
  Administered 2012-12-17 – 2012-12-22 (×6): 40 mg via ORAL
  Filled 2012-12-17 (×8): qty 1

## 2012-12-17 NOTE — Progress Notes (Signed)
Patient with no output for greater than 12 hours. Bladder scan done and stated greater than 852 ml.  On call MD made aware. RN to insert foley catheter per Dr. Mayford Knife. RN will continue to monitor. Louretta Parma, RN

## 2012-12-17 NOTE — Progress Notes (Signed)
Pt O4x, lying in bed no complaints of pain or sob. Charge RN admitted and initially  assessed Pt. Will continue to monitor

## 2012-12-17 NOTE — Progress Notes (Signed)
Pt states rt shoulder pain and nausea PRN given will continue to monitor

## 2012-12-17 NOTE — Op Note (Signed)
[name], [name] NO.:  [id number]  MEDICAL RECORD NO.:  [id number]  LOCATION:  6C02C                        FACILITY:  [facility]  PHYSICIAN:  Duke Salvia, MD, FACCDATE OF BIRTH:  1945-01-14  DATE OF PROCEDURE:  12/16/2012 DATE OF DISCHARGE:                              OPERATIVE REPORT   PREOPERATIVE DIAGNOSIS:  Nonischemic cardiomyopathy with syncope with beta-blocker induced high-grade heart block.  POSTOPERATIVE DIAGNOSIS:  Nonischemic cardiomyopathy with syncope with beta-blocker induced high-grade heart block.  PROCEDURE:  Defibrillator implantation with dual-chamber pacing capabilities because of high-grade heart block, and left ventricular lead placement because of the anticipated high percentage of left ventricular pacing given beta-blocker induced heart block.  Biventricular defibrillator implantation with high voltage lead assessment.  DESCRIPTION OF PROCEDURE:  Following obtaining informed consent, the patient was brought to the Electrophysiology Laboratory and placed on the fluoroscopic table in supine position.  After routine prep and drape of the left upper chest, lidocaine was infiltrated in prepectoral subclavicular region.  Incision was made and carried down to layer of the prepectoral fascia.  Using electrocautery and sharp dissection, a pocket was formed similarly.  Hemostasis was obtained.  Thereafter, attention was turned to gain access to the extrathoracic left subclavian vein which was accomplished without difficulty without the aspiration of air or puncture of the artery.  Three separate venipunctures were accomplished.  Guidewires were placed and retained. Sequentially, an 8, 9-1/2 and 7-French sheaths were placed through which were passed initially a [employer] __________ and a Saint Jude __________ coronary sinus cannulation catheter and a Saint Jude 20 mL active fixation atrial lead, serial number   ________[id number].  Under fluoroscopic guidance, the defibrillator lead was manipulated to the distal RV flora; I could not quite get it to the apex.  The bipolar amplitude was 9.9 with a pace impedance of 562 with threshold 1 V at 0.5 current threshold 1.8 mA.  There is no diaphragmatic pacing at 10 V. The current of injury was brisk.  This lead was secured to the prepectoral fascia.  We then attempted to gain access to the coronary sinus.  It turned out to be rather apically deployed from the circumflex artery. Interestingly, it was very serpiginous in its initial course.  We were ultimately able to cannulate it and there was a laterally coursing posterior vein into which we were able to pass with modest difficulty following exchange of sheath with a Saint Jude __________.  It was deployed to the junction of the mid and distal __________ was 6.9 with a pace impedance of 506, a threshold less than 2 V at 0.5 milliseconds. There was no diaphragmatic pacing at 10 V.  This lead was secured to the prepectoral fascia following removal of the deployment system.  Atrial lead was then deployed to the right atrial appendage where the bipolar P-wave was 2.2 with a pace impedance of 354, threshold 1.2 V at 0.5 millisecond, current at threshold was 3.2 mA, and there was no diaphragmatic pacing at 10 V.  The pocket was copiously irrigated with antibiotic containing saline solution.  The leads were then attached to a Saint Jude __________ the bipolar P-wave was 1.8 with a pace impedance of  330, threshold 0.5 V at 0.5 milliseconds.  The R-wave was 11.8 with a pace impedance of 610, threshold 0.5 at 0.4 and the LV impedance was 460 with a threshold __________.  High-voltage impedance was 45 ohms. Hemostasis was assured.  Leads and pulse generator were placed in the pocket secured to the prepectoral fascia.  The wound was then closed in 2 layers in normal fashion.  The wound was washed, dried, and a  benzoin Steri-Strip dressing was applied.  Needle counts, sponge counts, and instrument counts were correct at the end of the procedure according to staff.  The patient tolerated the procedure without apparent complication.  __________.     Duke Salvia, MD, [facility]     SCK/MEDQ  D:  12/16/2012  T:  12/17/2012  Job:  [id number]

## 2012-12-17 NOTE — Progress Notes (Signed)
1130 am pt for discharge , c/o nausea , vomitted clear emesis, claimed "not feeling well" b/p checked 70/54 mmhg, assisted back in bed .  Marvis Repress notified, with orders. Pt reconnected to cardiac monitor , vpaced, IV restarted, 250 ml bolus given as ordered.kept pt monitored.[name] husband updated with status.

## 2012-12-18 ENCOUNTER — Ambulatory Visit (HOSPITAL_COMMUNITY): Payer: Medicare Other

## 2012-12-18 DIAGNOSIS — I428 Other cardiomyopathies: Principal | ICD-10-CM

## 2012-12-18 LAB — BASIC METABOLIC PANEL
BUN: 32 mg/dL — ABNORMAL HIGH (ref 6–23)
Calcium: 8.1 mg/dL — ABNORMAL LOW (ref 8.4–10.5)
Chloride: 82 mEq/L — ABNORMAL LOW (ref 96–112)
GFR calc Af Amer: 31 mL/min — ABNORMAL LOW (ref >90)
GFR calc non Af Amer: 27 mL/min — ABNORMAL LOW (ref >90)
Glucose, Bld: 172 mg/dL — ABNORMAL HIGH (ref 70–99)
Potassium: 4.9 mEq/L (ref 3.5–5.1)
Sodium: 119 mEq/L — CL (ref 135–145)

## 2012-12-18 LAB — URINALYSIS, ROUTINE W REFLEX MICROSCOPIC
Nitrite: NEGATIVE
Nitrite: NEGATIVE
Protein, ur: NEGATIVE mg/dL
Protein, ur: NEGATIVE mg/dL
Specific Gravity, Urine: 1.018 (ref 1.005–1.030)
Specific Gravity, Urine: 1.022 (ref 1.005–1.030)
Urobilinogen, UA: 1 mg/dL (ref 0.0–1.0)
Urobilinogen, UA: 1 mg/dL (ref 0.0–1.0)
pH: 5.5 (ref 5.0–8.0)

## 2012-12-18 LAB — URINE MICROSCOPIC-ADD ON

## 2012-12-18 LAB — GLUCOSE, CAPILLARY: Glucose-Capillary: 167 mg/dL — ABNORMAL HIGH (ref 70–99)

## 2012-12-18 MED ORDER — FUROSEMIDE 80 MG PO TABS: 80.0000 mg | ORAL_TABLET | Freq: Two times a day (BID) | ORAL | Status: DC

## 2012-12-18 MED ORDER — HYDRALAZINE HCL 25 MG PO TABS: 25.0000 mg | ORAL_TABLET | Freq: Three times a day (TID) | ORAL | Status: DC

## 2012-12-18 MED ORDER — SODIUM CHLORIDE 0.9 % IV SOLN
INTRAVENOUS | Status: DC
  Administered 2012-12-18: 19:00:00 50 mL/h via INTRAVENOUS
  Administered 2012-12-20: 1 mL via INTRAVENOUS
  Administered 2012-12-21: 08:00:00 via INTRAVENOUS

## 2012-12-18 MED ORDER — SODIUM CHLORIDE 0.9 % IV BOLUS (SEPSIS)
250.0000 mL | Freq: Once | INTRAVENOUS | Status: AC
  Administered 2012-12-18: 250 mL via INTRAVENOUS

## 2012-12-18 MED ORDER — FUROSEMIDE 80 MG PO TABS
80.0000 mg | ORAL_TABLET | Freq: Two times a day (BID) | ORAL | Status: DC
  Administered 2012-12-18: 80 mg via ORAL
  Filled 2012-12-18 (×2): qty 1

## 2012-12-18 MED ORDER — ISOSORBIDE MONONITRATE ER 30 MG PO TB24
30.0000 mg | ORAL_TABLET | Freq: Every day | ORAL | Status: DC
  Administered 2012-12-19 – 2012-12-23 (×5): 30 mg via ORAL
  Filled 2012-12-18 (×5): qty 1

## 2012-12-18 NOTE — Progress Notes (Signed)
Lab called critical value of Na 119. Text page to Ward Givens PA Orders received.

## 2012-12-18 NOTE — Progress Notes (Signed)
ELECTROPHYSIOLOGY ROUNDING NOTE    Patient Name: [name] Date of Encounter: 12/18/2012    SUBJECTIVE:Patient feels better today.  Dizziness and nausea resolved.  Urinary retention last pm rquiring foley  TELEMETRY: Reviewed telemetry pt in sinus rhythm with ventricular pacing Filed Vitals:   12/17/12 1644 12/17/12 2119 12/18/12 0208 12/18/12 0539  BP: 142/81 130/64 138/90 110/81  Pulse: 84 77 86 80  Temp: 98 F (36.7 C) 97.7 F (36.5 C) 98 F (36.7 C) 97.3 F (36.3 C)  TempSrc: Oral Oral Oral Oral  Resp: 19 20 20 18   Height: 5\' 9"  (1.753 m)     Weight: 143 lb 4.8 oz (65 kg)   141 lb 8.6 oz (64.2 kg)  SpO2: 91% 100% 96% 97%    Intake/Output Summary (Last 24 hours) at 12/18/12 0636 Last data filed at 12/18/12 0530  Gross per 24 hour  Intake    480 ml  Output   1220 ml  Net   -740 ml    CURRENT MEDICATIONS: . aspirin  81 mg Oral Daily  . atorvastatin  40 mg Oral q1800  . azithromycin  500 mg Oral Daily  . carvedilol  12.5 mg Oral BID WC  . Chlorhexidine Gluconate Cloth  6 each Topical Daily  . furosemide  120 mg Oral Q24H  . furosemide  160 mg Oral Q24H  . gabapentin  300 mg Oral BID  . hydrOXYzine  10 mg Oral BID  . isosorbide mononitrate  60 mg Oral Q1200  . losartan  50 mg Oral Daily  . mupirocin ointment  1 application Nasal BID  . mupirocin ointment   Nasal BID  . pantoprazole  40 mg Oral Daily  . spironolactone  25 mg Oral Daily  . tiotropium  18 mcg Inhalation Daily  . traZODone  150 mg Oral QHS  . venlafaxine XR  150 mg Oral q morning - 10a    LABS: Basic Metabolic Panel:  Recent Labs  81/19/14 1008  NA 125*  K 4.4  CL 83*  CO2 28  GLUCOSE 111*  BUN 25*  CREATININE 1.22*  CALCIUM 9.2   CBC:  Recent Labs  12/16/12 1008  WBC 6.4  HGB 9.7*  HCT 28.3*  MCV 87.3  PLT 412*     Radiology/Studies:  Dg Chest 2 View  12/17/2012   CLINICAL DATA:  Mid chest pain.  EXAM: CHEST  2 VIEW  COMPARISON:  11/29/2012  FINDINGS:  AICD has been inserted. There is cardiomegaly with slight interstitial pulmonary edema and tiny bilateral effusions. No pneumothorax. There is a comminuted fracture of the proximal left humerus. No other acute osseous abnormality.  IMPRESSION: Slight interstitial pulmonary edema, new since the prior study. Small effusions.   Electronically Signed   By: Geanie Cooley M.D.   On: 12/17/2012 08:12   PHYSICAL EXAM Left chest with dermabond dressing, no hematoma Well developed and nourished in no acute distress HENT normal Neck supple with JVP-flat Clear Regular rate and rhythm, no murmurs or gallops Abd-soft with active BS No Clubbing cyanosis edema Skin-warm and dry A & Oriented  Grossly normal sensory and motor function   DEVICE INTERROGATION: Device interrogated by industry.  Lead values including impedence, sensing, threshold within normal values.    Active Problems:   Chronic systolic heart failure  hyponatremia Urinary retention  ?cause   Remove foley and assure urination Discharge to home Needs bmet next week Decrease lasis to 80/80 Resume hydral at 25 q8h Wound check  next week JH 3 weeks Sk 12 weeks

## 2012-12-18 NOTE — Progress Notes (Signed)
Utilization Review Completed Tykisha Areola J. Amy Belloso, RN, BSN, NCM [phone]  

## 2012-12-18 NOTE — Progress Notes (Signed)
Text page to Cardiology - Pt says she does not feel like she needs to void. Bladder scan showed 40 cc. Pt has had 400 po intake today. Ward Givens NP returned page orders received and order to hold Imdur, give fluid bolus and CXR

## 2012-12-18 NOTE — Progress Notes (Signed)
Called to room to lift pt up in bed   BP 106/63 HR 72 RR18 02 8 l n/c 98 % dropped down to 85 % coughing. Suctioned with yankeur but pt had already coughed out looked like something with seeds. 1218- 02 sat 100 % on 2 l n/c. Now alert - pt's husband said he did give her something to drink while she was laying down. Call to MD to make aware.

## 2012-12-18 NOTE — Progress Notes (Addendum)
Pt with episode of oxygen desaturation and hypotension.  Not voiding today.  D/W Ward Givens, NP - discharge on hold for today - he has adjusted medications.   Gypsy Balsam, RN, BSN     S: Patient has been feeling weak since after her morning medications.  While her husband was feeding her lunch, she began to choke and became hypoxic with sats in the 80's.  With continued coughing and suctioning, this corrected and she has had no further dyspnea or cough.  Her blood pressure has been soft all day and since foley d/c'd, she feels as though she has to urinate.  Bladder scan showed only 40 ml.  Currently patient denies chest pain or dyspnea.  She says that although she felt weak earlier, this has resolved.    O:   Filed Vitals:   12/18/12 1545  BP: 94/62  Pulse: 78  Temp: 98 F (36.7 C)  Resp: 20  pleasant, nad, aaox3.  Neck supple w/o jvd, lungs w bibasilar crackles that cleared with subsequent deep breaths, cor rrr, abd soft, nt/nd/bs+x4, ext no cce.  A/P:  1. Aspiration and hypoxia:  Resolved.  F/u cxr to r/o aspiration.  She is afebrile and in no distress at this time.  2.  Oliguria/CKD III:  In setting of relative hypotn over the past 24 hrs.  Good UO yesterday but only 50 ml so far today.  She received 120 mg of lasix last night and 160 this AM.  These doses have been reduced to 80mg  bid, which was her planned d/c dose.  She did have a foley overnight.  She reports h/o oliguria @ home and was dx with UTI prior to admission.  She is currently on azithromycin.  She's receiving a fluid bolus of 250 ml currently.  Checking bmet and UA.  UA last night was normal.  3.  Relative hypotension:  Adjusted meds.  Hold bb for now, though she says that she has been on this in the past and tolerated (I don't see this in any of her med lists dating back several office visits).  4.  Chronic Systolic CHF:  Volume currently looks good.  Will have to watch carefully following bolus.  Nicolasa Ducking,  NP

## 2012-12-19 DIAGNOSIS — I5022 Chronic systolic (congestive) heart failure: Secondary | ICD-10-CM

## 2012-12-19 LAB — BASIC METABOLIC PANEL
Calcium: 8.6 mg/dL (ref 8.4–10.5)
Chloride: 85 mEq/L — ABNORMAL LOW (ref 96–112)
Creatinine, Ser: 1.96 mg/dL — ABNORMAL HIGH (ref 0.50–1.10)
GFR calc Af Amer: 29 mL/min — ABNORMAL LOW (ref >90)
GFR calc non Af Amer: 25 mL/min — ABNORMAL LOW (ref >90)
Glucose, Bld: 104 mg/dL — ABNORMAL HIGH (ref 70–99)
Potassium: 4.8 mEq/L (ref 3.5–5.1)
Sodium: 121 mEq/L — ABNORMAL LOW (ref 135–145)

## 2012-12-19 LAB — CBC
Hemoglobin: 8.8 g/dL — ABNORMAL LOW (ref 12.0–15.0)
MCH: 30.4 pg (ref 26.0–34.0)
MCHC: 34.9 g/dL (ref 30.0–36.0)
MCV: 87.2 fL (ref 78.0–100.0)
Platelets: 309 10*3/uL (ref 150–400)
RBC: 2.89 MIL/uL — ABNORMAL LOW (ref 3.87–5.11)
RDW: 17.2 % — ABNORMAL HIGH (ref 11.5–15.5)

## 2012-12-19 NOTE — Progress Notes (Signed)
SUBJECTIVE:  No SOB.  However, she is very week.     PHYSICAL EXAM Filed Vitals:   12/18/12 1545 12/18/12 1738 12/18/12 2044 12/19/12 0432  BP: 94/62 108/67 101/70 125/68  Pulse: 78 81 78 85  Temp: 98 F (36.7 C) 98 F (36.7 C) 97.9 F (36.6 C) 98.1 F (36.7 C)  TempSrc: Oral Oral Oral Oral  Resp: 20 18 18 18   Height:      Weight:    144 lb 8 oz (65.545 kg)  SpO2: 92% 98% 100% 100%   General:  No acute distress Chest:  Wound OK Lungs:  Clear Heart:  RRR Abdomen:  Positive bowel sounds, no rebound no guarding Extremities:  Trace edema  LABS:  Results for orders placed during the hospital encounter of 12/16/12 (from the past 24 hour(s))  GLUCOSE, CAPILLARY     Status: Abnormal   Collection Time    12/18/12 12:18 PM      Result Value Range   Glucose-Capillary 167 (*) 70 - 99 mg/dL  BASIC METABOLIC PANEL     Status: Abnormal   Collection Time    12/18/12  5:20 PM      Result Value Range   Sodium 119 (*) 135 - 145 mEq/L   Potassium 4.9  3.5 - 5.1 mEq/L   Chloride 82 (*) 96 - 112 mEq/L   CO2 27  19 - 32 mEq/L   Glucose, Bld 172 (*) 70 - 99 mg/dL   BUN 32 (*) 6 - 23 mg/dL   Creatinine, Ser 1.47 (*) 0.50 - 1.10 mg/dL   Calcium 8.1 (*) 8.4 - 10.5 mg/dL   GFR calc non Af Amer 27 (*) >90 mL/min   GFR calc Af Amer 31 (*) >90 mL/min  URINALYSIS, ROUTINE W REFLEX MICROSCOPIC     Status: Abnormal   Collection Time    12/18/12  8:04 PM      Result Value Range   Color, Urine AMBER (*) YELLOW   APPearance CLOUDY (*) CLEAR   Specific Gravity, Urine 1.022  1.005 - 1.030   pH 5.0  5.0 - 8.0   Glucose, UA NEGATIVE  NEGATIVE mg/dL   Hgb urine dipstick LARGE (*) NEGATIVE   Bilirubin Urine SMALL (*) NEGATIVE   Ketones, ur 15 (*) NEGATIVE mg/dL   Protein, ur NEGATIVE  NEGATIVE mg/dL   Urobilinogen, UA 1.0  0.0 - 1.0 mg/dL   Nitrite NEGATIVE  NEGATIVE   Leukocytes, UA LARGE (*) NEGATIVE  URINE MICROSCOPIC-ADD ON     Status: Abnormal   Collection Time    12/18/12  8:04 PM      Result Value Range   Squamous Epithelial / LPF FEW (*) RARE   WBC, UA 21-50  <3 WBC/hpf   RBC / HPF 11-20  <3 RBC/hpf   Bacteria, UA FEW (*) RARE   Casts HYALINE CASTS (*) NEGATIVE   Urine-Other FEW YEAST    BASIC METABOLIC PANEL     Status: Abnormal   Collection Time    12/19/12  4:38 AM      Result Value Range   Sodium 121 (*) 135 - 145 mEq/L   Potassium 4.8  3.5 - 5.1 mEq/L   Chloride 85 (*) 96 - 112 mEq/L   CO2 24  19 - 32 mEq/L   Glucose, Bld 104 (*) 70 - 99 mg/dL   BUN 33 (*) 6 - 23 mg/dL   Creatinine, Ser 8.29 (*) 0.50 - 1.10 mg/dL   Calcium 8.6  8.4 - 10.5 mg/dL   GFR calc non Af Amer 25 (*) >90 mL/min   GFR calc Af Amer 29 (*) >90 mL/min  CBC     Status: Abnormal   Collection Time    12/19/12  4:38 AM      Result Value Range   WBC 5.0  4.0 - 10.5 K/uL   RBC 2.89 (*) 3.87 - 5.11 MIL/uL   Hemoglobin 8.8 (*) 12.0 - 15.0 g/dL   HCT 16.1 (*) 09.6 - 04.5 %   MCV 87.2  78.0 - 100.0 fL   MCH 30.4  26.0 - 34.0 pg   MCHC 34.9  30.0 - 36.0 g/dL   RDW 40.9 (*) 81.1 - 91.4 %   Platelets 309  150 - 400 K/uL    Intake/Output Summary (Last 24 hours) at 12/19/12 0747 Last data filed at 12/19/12 7829  Gross per 24 hour  Intake   1685 ml  Output    570 ml  Net   1115 ml    CXR:  1. Worsening pulmonary edema and pleural effusions.  2. Edema could obscure the suspected aspiration.   ASSESSMENT AND PLAN:  HYPOXIA:  Sats are fine.  Despite the CXR her lungs sound OK and she is breathing well.  I will continue the Zithromax.  OLIGURIA/CKD III:  Creat up slightly. Follow.   HYPOTENSION:  BP is improved.  See below.    CHRONIC CHF:  I will keep her today off of diuretic.  Her Na is low but improved.  Follow again in the AM. I will continue to hold the beta blocker today.  She did seem to tolerate the ARB   UTI:  She has a few bacteria.  I will await the culture.  Leave foley in today.   [name] [facility] 12/19/2012 7:47 AM

## 2012-12-20 DIAGNOSIS — I509 Heart failure, unspecified: Secondary | ICD-10-CM | POA: Insufficient documentation

## 2012-12-20 LAB — CBC
HCT: 24 % — ABNORMAL LOW (ref 36.0–46.0)
Hemoglobin: 8.1 g/dL — ABNORMAL LOW (ref 12.0–15.0)
MCH: 29.7 pg (ref 26.0–34.0)
MCHC: 33.8 g/dL (ref 30.0–36.0)
MCV: 87.9 fL (ref 78.0–100.0)
Platelets: 308 10*3/uL (ref 150–400)
RBC: 2.73 MIL/uL — ABNORMAL LOW (ref 3.87–5.11)
RDW: 17.6 % — ABNORMAL HIGH (ref 11.5–15.5)
WBC: 5 10*3/uL (ref 4.0–10.5)

## 2012-12-20 LAB — BASIC METABOLIC PANEL
Calcium: 8.4 mg/dL (ref 8.4–10.5)
Creatinine, Ser: 1.86 mg/dL — ABNORMAL HIGH (ref 0.50–1.10)
GFR calc Af Amer: 31 mL/min — ABNORMAL LOW (ref >90)

## 2012-12-20 LAB — URINE CULTURE
Colony Count: NO GROWTH
Culture: NO GROWTH

## 2012-12-20 MED ORDER — FUROSEMIDE 40 MG PO TABS
40.0000 mg | ORAL_TABLET | Freq: Two times a day (BID) | ORAL | Status: DC
  Administered 2012-12-20 – 2012-12-23 (×7): 40 mg via ORAL
  Filled 2012-12-20 (×9): qty 1

## 2012-12-20 NOTE — Care Management Note (Addendum)
    Page 1 of 2   12/23/2012     12:00:18 PM   CARE MANAGEMENT NOTE 12/23/2012  Patient:  [name], [name]   Account Number:  [id number]  Date Initiated:  12/20/2012  Documentation initiated by:  [facility]  Subjective/Objective Assessment:   67 y.o. 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 // Home with spouse     Action/Plan:   BI-VENTRICULAR IMPLANTABLE CARDIOVERTER DEFIBRILLATOR (CRT-D)  as a surgical intervention //Home with [facility] vs hospice   Anticipated DC Date:  12/22/2012   Anticipated DC Plan:  HOME W HOME HEALTH SERVICES      DC Planning Services  CM consult      [facility] Choice  HOME HEALTH   Choice offered to / List presented to:  C-1 Patient        HH arranged  HH-1 RN  HH-10 DISEASE MANAGEMENT  HH-2 PT  HH-4 NURSE'S AIDE  HH-6 SOCIAL WORKER      HH agency  [facility]   Status of service:  In process, will continue to follow Medicare Important Message given?   (If response is "NO", the following Medicare IM given date fields will be blank) Date Medicare IM given:   Date Additional Medicare IM given:    Discharge Disposition:    Per UR Regulation:    If discussed at Long Length of Stay Meetings, dates discussed:    Comments:  12/23/12 1145 Oletta Cohn, RN, BSN, [employer] [phone] Spoke with pt at bedside regarding discharge planning for [name]. Offered pt list of home health agencies to choose from.  Pt chose [facility] to render services. Darlin Drop of [facility] notified.  No DME needs identified at this time.  12/20/12 1130 Oletta Cohn, RN, BSN, NCM [phone] NCM ordered PT consult for dispostion reccomendations.

## 2012-12-20 NOTE — Progress Notes (Signed)
   SUBJECTIVE:  No SOB.  However, she is very very weak.  I tried to get her up to move her to the chair and she couldn't even sit up.    PHYSICAL EXAM Filed Vitals:   12/20/12 0449 12/20/12 0800 12/20/12 0834 12/20/12 0835  BP: 143/89  130/93   Pulse: 89  92   Temp: 98.2 F (36.8 C)  98.1 F (36.7 C)   TempSrc: Oral  Oral   Resp: 18  16   Height:      Weight: 145 lb (65.772 kg)     SpO2: 96% 93% 85% 98%   General:  No acute distress Chest:  Wound OK Lungs:  Clear Heart:  RRR Abdomen:  Positive bowel sounds, no rebound no guarding Extremities:  No edema Neuro:  Nonfocal.  LABS:  Results for orders placed during the hospital encounter of 12/16/12 (from the past 24 hour(s))  BASIC METABOLIC PANEL     Status: Abnormal   Collection Time    12/20/12  5:25 AM      Result Value Range   Sodium 125 (*) 135 - 145 mEq/L   Potassium 4.6  3.5 - 5.1 mEq/L   Chloride 88 (*) 96 - 112 mEq/L   CO2 25  19 - 32 mEq/L   Glucose, Bld 123 (*) 70 - 99 mg/dL   BUN 34 (*) 6 - 23 mg/dL   Creatinine, Ser 1.61 (*) 0.50 - 1.10 mg/dL   Calcium 8.4  8.4 - 09.6 mg/dL   GFR calc non Af Amer 27 (*) >90 mL/min   GFR calc Af Amer 31 (*) >90 mL/min    Intake/Output Summary (Last 24 hours) at 12/20/12 0841 Last data filed at 12/20/12 0600  Gross per 24 hour  Intake   1320 ml  Output    450 ml  Net    870 ml      ASSESSMENT AND PLAN:  HYPOXIA:  Sats are low off of O2.  However, she does have oxygen at home .  Continue current therapy.   OLIGURIA/CKD III:  Creat is stable.    HYPOTENSION:  BP is improved.    CHRONIC CHF: Her Na is low but improved.  Follow again in the AM.  She did seem to tolerate the ARB.  I am going to start a low dose diuretic today.     UTI:  She has a few bacteria.  Culture is still in progress.   WEAKNESS:  Consult PT.   [name] 12/20/2012 8:41 AM

## 2012-12-20 NOTE — Evaluation (Signed)
Physical Therapy Evaluation Patient Details Name: [name] MRN: [id number] DOB: January 20, 1945 Today's Date: 12/20/2012 Time: 4098-1191 PT Time Calculation (min): 28 min  PT Assessment / Plan / Recommendation History of Present Illness  67 y.o. female with a hx of CAD s/p MI tx with PCI to RCA in 2002, chronic systolic CHF, syncope in the setting of 2:1 heart block, COPD, T2DM, iron deficiency anemia, HTN, dementia.admitted 11/18-11/22 with a left humeral fracture in the setting of syncope and fall. She was placed in a sling by orthopedics with plans for eventual surgical correction of her left shoulder fracture  Clinical Impression  Pt pleasant and stating she was ambulating independently at home but 5 falls this year. Pt with significant posterior lean in sitting, inability to stand without max assist and knee blocked. Pt spouse not present to confirm level of assist he can provide at home. Pt with these as well as below deficits and would benefit from acute therapy as well as SNF to decrease burden of care and maximize function. Currently recommend 2 person assist for all transfers due to pt weakness and impaired balance. Pt requires constant cueing to not reach for items with LUE and maintain NWB.    PT Assessment  Patient needs continued PT services    Follow Up Recommendations  SNF;Supervision/Assistance - 24 hour    Does the patient have the potential to tolerate intense rehabilitation      Barriers to Discharge Decreased caregiver support      Equipment Recommendations  None recommended by PT    Recommendations for Other Services     Frequency Min 3X/week    Precautions / Restrictions Precautions Precautions: Fall;ICD/Pacemaker Precaution Comments: LUE fx Required Braces or Orthoses: Sling Restrictions LUE Weight Bearing: Non weight bearing   Pertinent Vitals/Pain 93% 2L No pain     Mobility  Bed Mobility Bed Mobility: Supine to Sit;Sit to Supine Supine to  Sit: 3: Mod assist;HOB elevated Sit to Supine: 2: Max assist Details for Bed Mobility Assistance: Pt assisted to sitting on EOB with assist to elevate trunk, cueing for sequence to not use LUE and assist to pivot. Returned to supine due to posterior lean then sat again to attempting standing and transfer to chair Transfers Transfers: Sit to Stand;Stand to Sit;Stand Pivot Transfers Sit to Stand: 2: Max assist;From elevated surface;From chair/3-in-1 Stand to Sit: 3: Mod assist;To bed;To chair/3-in-1 Stand Pivot Transfers: 3: Mod assist Details for Transfer Assistance: stood from elevated bed with mod assist and took a few steps away from bed then back with assist to control descent. Stood again from bed to pivot to chair with pt holding onto therapist on right and stood 3x from chair with max assist and cueing for sequence to place chair alarm Ambulation/Gait Ambulation/Gait Assistance: 3: Mod assist Ambulation Distance (Feet): 3 Feet Assistive device: 1 person hand held assist Ambulation/Gait Assistance Details: pt stepped forward and back 3 steps with mod assist for weight shift, balance and control with pt arm around therapist Gait Pattern: Shuffle Stairs: No    Exercises     PT Diagnosis: Difficulty walking;Altered mental status  PT Problem List: Decreased strength;Decreased cognition;Decreased activity tolerance;Decreased balance;Decreased mobility;Decreased knowledge of precautions PT Treatment Interventions: Gait training;Functional mobility training;Therapeutic activities;Therapeutic exercise;Patient/family education;Balance training     PT Goals(Current goals can be found in the care plan section) Acute Rehab PT Goals Patient Stated Goal: return home PT Goal Formulation: With patient Time For Goal Achievement: 01/03/13 Potential to Achieve Goals: Fair  Visit Information  Last PT Received On: 12/20/12 Assistance Needed: +2 History of Present Illness: 67 y.o. female with a hx  of CAD s/p MI tx with PCI to RCA in 2002, chronic systolic CHF, syncope in the setting of 2:1 heart block, COPD, T2DM, iron deficiency anemia, HTN, dementia.admitted 11/18-11/22 with a left humeral fracture in the setting of syncope and fall. She was placed in a sling by orthopedics with plans for eventual surgical correction of her left shoulder fracture       Prior Functioning  Home Living Family/patient expects to be discharged to:: Private residence Living Arrangements: Spouse/significant other Available Help at Discharge: Family Type of Home: House Home Access: Stairs to enter Home Layout: One level Prior Function Level of Independence: Needs assistance Gait / Transfers Assistance Needed: pt states she ambulates independently but also reports 5 falls in the last year ADL's / Homemaking Assistance Needed: spouse has been bathing and dressing her as well as all housework Communication Communication: No difficulties    Cognition  Cognition Arousal/Alertness: Awake/alert Behavior During Therapy: Flat affect Overall Cognitive Status: No family/caregiver present to determine baseline cognitive functioning Area of Impairment: Memory;Safety/judgement;Problem solving;Following commands Memory: Decreased short-term memory Following Commands: Follows one step commands consistently Safety/Judgement: Decreased awareness of deficits;Decreased awareness of safety Problem Solving: Slow processing;Difficulty sequencing    Extremity/Trunk Assessment Upper Extremity Assessment Upper Extremity Assessment: Generalized weakness;LUE deficits/detail LUE Deficits / Details: fx and pacemaker did not move LUE during session Lower Extremity Assessment Lower Extremity Assessment: Generalized weakness Cervical / Trunk Assessment Cervical / Trunk Assessment: Normal   Balance Balance Balance Assessed: Yes Static Sitting Balance Static Sitting - Balance Support: Feet supported;Right upper extremity  supported Static Sitting - Level of Assistance: 4: Min assist Static Sitting - Comment/# of Minutes: pt requires constant multimodal cueing for anterior translation and assist due to tendency for posterior lean Static Standing Balance Static Standing - Balance Support: Right upper extremity supported Static Standing - Level of Assistance: 3: Mod assist Static Standing - Comment/# of Minutes: 1  End of Session PT - End of Session Equipment Utilized During Treatment: Gait belt;Oxygen Activity Tolerance: Patient tolerated treatment well Patient left: in chair;with call bell/phone within reach;with chair alarm set Nurse Communication: Mobility status;Precautions  GP     Toney Sang Beth 12/20/2012, 3:49 PM Delaney Meigs, PT [phone]

## 2012-12-20 NOTE — Progress Notes (Signed)
Patient evaluated for community based chronic disease management services with [facility] Care Management Program as a benefit of patient's [employer]. Spoke with patient at bedside to explain [facility] Care Management services.  Patient is concerned about her weight loss and would like assistance with the cost of her nutritional supplements. She lives with her spouse.  She does not have a scale but is able to afford one and the cost of her current medications. Her past medical history includes CHF COPD iron deficiency anemia (chronic) and a fall on 11.22 that resulted in a left humeral fracture.  She was recently on Hospice services but discontinued them on 12.12.14 because she wants to receive aggressive treatment of her chronic weakness.  Patient will receive a post discharge transition of care call and will be evaluated for monthly home visits for assessments and disease process education.  Left contact information and [facility] literature at bedside. Made Inpatient Case Manager aware that [facility] Care Management following. Of note, [facility] Care Management services does not replace or interfere with any services that are arranged by inpatient case management or social work.  For additional questions or referrals please contact Anibal Henderson BSN RN [facility] Liaison at [phone].

## 2012-12-20 NOTE — Progress Notes (Signed)
02 sat = 85% at rest on room air and 96 % on 2 liter

## 2012-12-21 DIAGNOSIS — I2510 Atherosclerotic heart disease of native coronary artery without angina pectoris: Secondary | ICD-10-CM

## 2012-12-21 DIAGNOSIS — I10 Essential (primary) hypertension: Secondary | ICD-10-CM

## 2012-12-21 MED ORDER — CARVEDILOL 6.25 MG PO TABS
6.2500 mg | ORAL_TABLET | Freq: Two times a day (BID) | ORAL | Status: DC
  Administered 2012-12-21 – 2012-12-23 (×4): 6.25 mg via ORAL
  Filled 2012-12-21 (×6): qty 1

## 2012-12-21 NOTE — Progress Notes (Signed)
Clinical Social Work Department CLINICAL SOCIAL WORK PLACEMENT NOTE 12/21/2012  Patient:  SATIVA, GELLES  Account Number:  [id number] Admit date:  12/16/2012  Clinical Social Worker:  Sharol Harness, Theresia Majors  Date/time:  12/21/2012 02:20 PM  Clinical Social Work is seeking post-discharge placement for this patient at the following level of care:   SKILLED NURSING   (*CSW will update this form in Epic as items are completed)   12/21/2012  Patient/family provided with [facility] Department of Clinical Social Work's list of facilities offering this level of care within the geographic area requested by the patient (or if unable, by the patient's family).  12/21/2012  Patient/family informed of their freedom to choose among providers that offer the needed level of care, that participate in Medicare, Medicaid or managed care program needed by the patient, have an available bed and are willing to accept the patient.  12/21/2012  Patient/family informed of [facility]' ownership interest in [facility], as well as of the fact that they are under no obligation to receive care at this facility.  PASARR submitted to EDS on 12/21/2012 PASARR number received from EDS on 12/21/2012  FL2 transmitted to all facilities in geographic area requested by pt/family on  12/21/2012 FL2 transmitted to all facilities within larger geographic area on   Patient informed that his/her managed care company has contracts with or will negotiate with  certain facilities, including the following:     Patient/family informed of bed offers received:   Patient chooses bed at  Physician recommends and patient chooses bed at    Patient to be transferred to  on   Patient to be transferred to facility by   The following physician request were entered in Epic:   Additional CommentsSharol Harness, LCSWA [phone]

## 2012-12-21 NOTE — Progress Notes (Addendum)
Clinical Social Work Department BRIEF PSYCHOSOCIAL ASSESSMENT 12/21/2012  Patient:  [name], [name]     Account Number:  [id number]     Admit date:  12/16/2012  Clinical Social Worker:  Harless Nakayama  Date/Time:  12/21/2012 02:00 PM  Referred by:  Physician  Date Referred:  12/21/2012 Referred for  SNF Placement   Other Referral:   Interview type:  Patient Other interview type:    PSYCHOSOCIAL DATA Living Status:  HUSBAND Admitted from facility:   Level of care:   Primary support name:  [name] [phone] Primary support relationship to patient:  SPOUSE Degree of support available:   Pt has supportive family    CURRENT CONCERNS Current Concerns  Post-Acute Placement   Other Concerns:    SOCIAL WORK ASSESSMENT / PLAN CSW spoke with pt about PT recommednation. Pt said she was aware of recommendation but thought she could stay here in the hospital to receive the rehab. CSW explaind that ST rehab would be at a SNF. Pt was understanding and agreed to CSW faxing referral out to [facility]. Pt has no preferences at this time.  Pt will need 3 night qualifying inpatient stay in order to go to SNF with Medicare benefits.   Assessment/plan status:  Psychosocial Support/Ongoing Assessment of Needs Other assessment/ plan:   Information/referral to community resources:   Will provide SNF list with bed offers    PATIENT'S/FAMILY'S RESPONSE TO PLAN OF CARE: Pt is agreeable to SNF       Hedwig Mcfall, LCSWA [phone]

## 2012-12-21 NOTE — Progress Notes (Addendum)
Patient ID: [name], female   DOB: 1945/02/05, 67 y.o.   MRN: [id number]    Primary cardiologist:  Subjective:    Patient feeling better today. Denies any significant SOB  Objective:   Temp:  [97.4 F (36.3 C)-98.1 F (36.7 C)] 98.1 F (36.7 C) (12/20 0934) Pulse Rate:  [77-85] 77 (12/20 0508) Resp:  [17-18] 18 (12/20 0508) BP: (122-146)/(73-96) 142/83 mmHg (12/20 0934) SpO2:  [93 %-100 %] 93 % (12/20 0940) Weight:  [146 lb 3.2 oz (66.316 kg)] 146 lb 3.2 oz (66.316 kg) (12/20 0508) Last BM Date: 12/19/12  Filed Weights   12/19/12 0432 12/20/12 0449 12/21/12 0508  Weight: 144 lb 8 oz (65.545 kg) 145 lb (65.772 kg) 146 lb 3.2 oz (66.316 kg)    Intake/Output Summary (Last 24 hours) at 12/21/12 1025 Last data filed at 12/21/12 1021  Gross per 24 hour  Intake 2237.5 ml  Output   3300 ml  Net -1062.5 ml    Telemetry: V-paced  Exam:  General:NAD  Resp:CTAB  Cardiac:RRR, no m/r/g, no JVD, no carotid bruits  NW:GNFA, NT, ND  OZH:YQMVHQIONGE warm, no edema  Neuro: no focal deficits    Lab Results:  Basic Metabolic Panel:  Recent Labs Lab 12/18/12 1720 12/19/12 0438 12/20/12 0525  NA 119* 121* 125*  K 4.9 4.8 4.6  CL 82* 85* 88*  CO2 27 24 25   GLUCOSE 172* 104* 123*  BUN 32* 33* 34*  CREATININE 1.84* 1.96* 1.86*  CALCIUM 8.1* 8.6 8.4    Liver Function Tests: No results found for this basename: AST, ALT, ALKPHOS, BILITOT, PROT, ALBUMIN,  in the last 168 hours  CBC:  Recent Labs Lab 12/16/12 1008 12/19/12 0438 12/20/12 1115  WBC 6.4 5.0 5.0  HGB 9.7* 8.8* 8.1*  HCT 28.3* 25.2* 24.0*  MCV 87.3 87.2 87.9  PLT 412* 309 308    Cardiac Enzymes: No results found for this basename: CKTOTAL, CKMB, CKMBINDEX, TROPONINI,  in the last 168 hours  BNP:  Recent Labs  08/14/12 0946 11/18/12 1608 11/29/12 2100  PROBNP 1536.0* 1238.0* 8012.0*    Coagulation: No results found for this basename: INR,  in the last 168  hours  ECG:   Medications:   Scheduled Medications: . aspirin  81 mg Oral Daily  . atorvastatin  40 mg Oral q1800  . azithromycin  500 mg Oral Daily  . furosemide  40 mg Oral BID  . gabapentin  300 mg Oral BID  . hydrOXYzine  10 mg Oral BID  . isosorbide mononitrate  30 mg Oral Q1200  . losartan  50 mg Oral Daily  . mupirocin ointment   Nasal BID  . pantoprazole  40 mg Oral Daily  . spironolactone  25 mg Oral Daily  . tiotropium  18 mcg Inhalation Daily  . traZODone  150 mg Oral QHS  . venlafaxine XR  150 mg Oral q morning - 10a     Infusions: . sodium chloride 50 mL/hr at 12/21/12 0734     PRN Medications:  acetaminophen, albuterol, alum & mag hydroxide-simeth, nitroGLYCERIN, ondansetron (ZOFRAN) IV, traMADol     Assessment/Plan    67 yo female hx of CAD with prior MI with PCI to RCA in 2002, chronic systolic HF, COPD, DM2, HTN, and dementia. Recent admission with syncope and humeral fracture after standing up from the commonde.   1. CAD/ICM - patient appears euvolemic today - blood pressures improved, will resume back coreg. Will put her on 1/2 dose that  she was previously on, start coreg 6.25mg  bid, this can be further titrated as an outpatient. Continue ARB and oral lasix.  - 2. Heat block - s/p BiV AICD placement 12/17/12 by Dr Graciela Husbands - normal pacing by telemetry, patient will follow with EP as an outpatient.   3. Hypotension - resolved, will start back low dose beta blocker.   5. Hypoxia - CXR 12/18 shows worsening pulm edema - she is 93% on room air this morning, she is on home O2 at home due to COPD and this appears to be her baseline.  - started on azithro for possible pneumonia. WBC normal, afebrile. This is day 4 of 7 day planned course - she diuresed 1.7 liters yesterday with oral lasix, likely contributing  6. Weakness - PT consulted, plans for continued PT services. Possible SNF placement at discharge per recs, will speak with social work to see  if can be arranged  7. Anemia - Hgb trending down, no history of acute blood loss, her ICD site looks clean with no significant hematoma.  - will need repeat CBC as outpatient with close follow up.    If rehab facility can be arranged over the weekend fine to be discharged home today, she will need follow up within 2 weeks with Dr Antoine Poche as well as follow up with Dr Graciela Husbands in EP clinic.    Dina Rich, M.D., F.A.C.C.

## 2012-12-22 LAB — CBC
HCT: 24.7 % — ABNORMAL LOW (ref 36.0–46.0)
Hemoglobin: 8.4 g/dL — ABNORMAL LOW (ref 12.0–15.0)
MCHC: 34 g/dL (ref 30.0–36.0)
MCV: 89.8 fL (ref 78.0–100.0)
RBC: 2.75 MIL/uL — ABNORMAL LOW (ref 3.87–5.11)
WBC: 5.1 10*3/uL (ref 4.0–10.5)

## 2012-12-22 LAB — BASIC METABOLIC PANEL
BUN: 19 mg/dL (ref 6–23)
CO2: 26 mEq/L (ref 19–32)
GFR calc non Af Amer: 48 mL/min — ABNORMAL LOW (ref >90)
Glucose, Bld: 186 mg/dL — ABNORMAL HIGH (ref 70–99)
Potassium: 4.4 mEq/L (ref 3.5–5.1)
Sodium: 131 mEq/L — ABNORMAL LOW (ref 135–145)

## 2012-12-22 MED ORDER — POLYETHYLENE GLYCOL 3350 17 G PO PACK
17.0000 g | PACK | Freq: Every day | ORAL | Status: DC
  Administered 2012-12-22: 11:00:00 17 g via ORAL
  Filled 2012-12-22 (×2): qty 1

## 2012-12-22 MED ORDER — DIPHENHYDRAMINE HCL 25 MG PO CAPS
25.0000 mg | ORAL_CAPSULE | Freq: Three times a day (TID) | ORAL | Status: DC | PRN
  Administered 2012-12-22: 16:00:00 25 mg via ORAL
  Filled 2012-12-22: qty 1

## 2012-12-22 NOTE — Progress Notes (Signed)
Patient ID: [name], female   DOB: 27-Apr-1945, 67 y.o.   MRN: [id number]    Subjective:    No complaints  Objective:   Temp:  [98 F (36.7 C)-98.4 F (36.9 C)] 98 F (36.7 C) (12/21 0510) Pulse Rate:  [77-92] 77 (12/21 0825) Resp:  [18-20] 18 (12/21 0825) BP: (120-148)/(66-83) 148/81 mmHg (12/21 0510) SpO2:  [93 %-100 %] 99 % (12/21 0825) Weight:  [148 lb 9.6 oz (67.405 kg)] 148 lb 9.6 oz (67.405 kg) (12/21 0510) Last BM Date: 12/19/12  Filed Weights   12/20/12 0449 12/21/12 0508 12/22/12 0510  Weight: 145 lb (65.772 kg) 146 lb 3.2 oz (66.316 kg) 148 lb 9.6 oz (67.405 kg)    Intake/Output Summary (Last 24 hours) at 12/22/12 7829 Last data filed at 12/22/12 0908  Gross per 24 hour  Intake   2640 ml  Output   2975 ml  Net   -335 ml    Telemetry: no events  Exam:  General:NAD  Resp: CTAB  Cardiac:RRR, no m/r/g, no JVD  FA:OZHY, NT, ND, NABS  MSK: extremities warm, no edema  Neuro: no focal deficits   Lab Results:  Basic Metabolic Panel:  Recent Labs Lab 12/18/12 1720 12/19/12 0438 12/20/12 0525  NA 119* 121* 125*  K 4.9 4.8 4.6  CL 82* 85* 88*  CO2 27 24 25   GLUCOSE 172* 104* 123*  BUN 32* 33* 34*  CREATININE 1.84* 1.96* 1.86*  CALCIUM 8.1* 8.6 8.4    Liver Function Tests: No results found for this basename: AST, ALT, ALKPHOS, BILITOT, PROT, ALBUMIN,  in the last 168 hours  CBC:  Recent Labs Lab 12/16/12 1008 12/19/12 0438 12/20/12 1115  WBC 6.4 5.0 5.0  HGB 9.7* 8.8* 8.1*  HCT 28.3* 25.2* 24.0*  MCV 87.3 87.2 87.9  PLT 412* 309 308    Cardiac Enzymes: No results found for this basename: CKTOTAL, CKMB, CKMBINDEX, TROPONINI,  in the last 168 hours  BNP:  Recent Labs  08/14/12 0946 11/18/12 1608 11/29/12 2100  PROBNP 1536.0* 1238.0* 8012.0*    Coagulation: No results found for this basename: INR,  in the last 168 hours  ECG:   Medications:   Scheduled Medications: . aspirin  81 mg Oral Daily  . atorvastatin   40 mg Oral q1800  . azithromycin  500 mg Oral Daily  . carvedilol  6.25 mg Oral BID WC  . furosemide  40 mg Oral BID  . gabapentin  300 mg Oral BID  . hydrOXYzine  10 mg Oral BID  . isosorbide mononitrate  30 mg Oral Q1200  . losartan  50 mg Oral Daily  . mupirocin ointment   Nasal BID  . pantoprazole  40 mg Oral Daily  . polyethylene glycol  17 g Oral Daily  . spironolactone  25 mg Oral Daily  . tiotropium  18 mcg Inhalation Daily  . traZODone  150 mg Oral QHS  . venlafaxine XR  150 mg Oral q morning - 10a     Infusions: . sodium chloride 50 mL/hr at 12/21/12 0734     PRN Medications:  acetaminophen, albuterol, alum & mag hydroxide-simeth, nitroGLYCERIN, ondansetron (ZOFRAN) IV, traMADol     Assessment/Plan    1. CAD/ICM  - patient appears euvolemic today  - blood pressures improved, restarted her coreg on 1/2 dose yesterday, blood pressure remains stable. This can be further titrated at follow up. -  2. Heart block  - s/p BiV AICD placement 12/17/12 by Dr Graciela Husbands  -  normal pacing by telemetry, patient will follow with EP as an outpatient.   3. Hypotension  - resolved, we are starting back her medications.   4. Hypoxia  - CXR 12/18 showed worsening pulm edema, she has diuresed since then with improved symptoms.  - she is 93% on room air this morning, she is on home O2 at home due to COPD and this appears to be her baseline.  - started on azithro for possible pneumonia. WBC normal, afebrile. This is day 5 of 7 day planned course    5. Weakness  - PT consulted, patient needs SNF at discharge. Arrangements being made by social work, placement pending.   6. Anemia  - Hgb had been trending down, no history of acute blood loss, her ICD site looks clean with no significant hematoma.  - will need repeat CBC    If rehab facility can be arranged over the weekend fine to be discharged home today, she will need follow up within 3 weeks with Dr Antoine Poche as well as follow  up with Dr Graciela Husbands in EP clinic.           Dina Rich, M.D., F.A.C.C.

## 2012-12-22 NOTE — Progress Notes (Signed)
Pt having chest pain described as 7/10 sharp on her mid chest. VSS stable, EKG completed and 2 NTG given with relieve. Cardiology page. We'll continue with POC.

## 2012-12-23 ENCOUNTER — Ambulatory Visit: Payer: Medicare Other | Admitting: Internal Medicine

## 2012-12-23 ENCOUNTER — Other Ambulatory Visit: Payer: Self-pay | Admitting: Pulmonary Disease

## 2012-12-23 ENCOUNTER — Encounter (HOSPITAL_COMMUNITY): Payer: Self-pay | Admitting: Nurse Practitioner

## 2012-12-23 MED ORDER — CARVEDILOL 12.5 MG PO TABS: 6.2500 mg | ORAL_TABLET | Freq: Two times a day (BID) | ORAL | Status: AC

## 2012-12-23 NOTE — Progress Notes (Signed)
Patient Name: [name] Date of Encounter: 12/23/2012   Principal Problem:   Chronic systolic heart failure Active Problems:   Type II or unspecified type diabetes mellitus with renal manifestations, uncontrolled(250.42)   Hyperlipidemia LDL goal < 70   HYPERTENSION   CORONARY ARTERY DISEASE   Moderate COPD (chronic obstructive pulmonary disease)   Iron deficiency anemia   Chronic systolic CHF (congestive heart failure)   History of bradycardia   SUBJECTIVE  No chest pain or sob.  Hasn't ambulated today.  She has decided to go home with home health as opposed to going to snf after discharge.  She would like to go home today.  CURRENT MEDS . aspirin  81 mg Oral Daily  . atorvastatin  40 mg Oral q1800  . azithromycin  500 mg Oral Daily  . carvedilol  6.25 mg Oral BID WC  . furosemide  40 mg Oral BID  . gabapentin  300 mg Oral BID  . hydrOXYzine  10 mg Oral BID  . isosorbide mononitrate  30 mg Oral Q1200  . losartan  50 mg Oral Daily  . mupirocin ointment   Nasal BID  . pantoprazole  40 mg Oral Daily  . polyethylene glycol  17 g Oral Daily  . spironolactone  25 mg Oral Daily  . tiotropium  18 mcg Inhalation Daily  . traZODone  150 mg Oral QHS  . venlafaxine XR  150 mg Oral q morning - 10a    OBJECTIVE  Filed Vitals:   12/23/12 0603 12/23/12 0626 12/23/12 0945 12/23/12 1237  BP: 130/80 146/86 126/76 112/77  Pulse: 88 88 80 92  Temp:  98 F (36.7 C)  97.8 F (36.6 C)  TempSrc:  Oral  Oral  Resp:  18  18  Height:      Weight:  145 lb 14.4 oz (66.18 kg)    SpO2:  96%  100%    Intake/Output Summary (Last 24 hours) at 12/23/12 1442 Last data filed at 12/23/12 1200  Gross per 24 hour  Intake 884.17 ml  Output   1676 ml  Net -791.83 ml   Filed Weights   12/21/12 0508 12/22/12 0510 12/23/12 0626  Weight: 146 lb 3.2 oz (66.316 kg) 148 lb 9.6 oz (67.405 kg) 145 lb 14.4 oz (66.18 kg)    PHYSICAL EXAM  General: Pleasant, NAD. Neuro: Alert and oriented  X 3. Moves all extremities spontaneously. Psych: Normal affect. HEENT:  Normal  Neck: Supple without bruits.  JVP ~ 10. Lungs:  Resp regular and unlabored, bibasilar crackles. Heart: RRR no s3, s4, or murmurs. Abdomen: Soft, non-tender, non-distended, BS + x 4.  Extremities: No clubbing, cyanosis.  Trace bilat LE edema. DP/PT/Radials 1+ and equal bilaterally.  Accessory Clinical Findings  CBC  Recent Labs  12/22/12 1055  WBC 5.1  HGB 8.4*  HCT 24.7*  MCV 89.8  PLT 299   Basic Metabolic Panel  Recent Labs  12/22/12 1055  NA 131*  K 4.4  CL 96  CO2 26  GLUCOSE 186*  BUN 19  CREATININE 1.14*  CALCIUM 8.4   TELE  paced  ASSESSMENT AND PLAN  1.  Acute on chronic systolic CHF/ICM:  S/p biventricular ICD placement this admission.  Hospital course complicated by deconditioning, wkns, hypoxia in setting of choking/aspiration, poss pna, hypotension, renal insuff, volume overload, and urinary retention.  She had initially planned to go to SNF, as suggested by PT, but has had a change of heart and would like  to be home for christmas.  On exam today, she has mild volume overload, though her weight is down from yesterday.  Blood pressures and renal fxn are stable.  We will d/c foley now and plan to discharge this afternoon provided that she is able to void w/o difficulty.  We will increase her oral lasix dose back to 80mg  bid (prev on 160 am/120 pm).  Cont bb, arb, spiro.  She will require early f/u and labs within the week.  HH is being arranged.  2.  CAD:  No chest pain.  Cont asa, statin, bb, nitrate.  3.  Possible PNA:  Cont zithromax to completion - today is 6/7.  4.  Anemia:  Stable.  ICD site stable.  5.  Hypoxia:  In setting of chf, aspiration.  Finish zithromax.  Afebrile.  Nl WBC.  Increase lasix to 80mg  bid as above.  She uses O2 @ night @ home.  6.  Heart Block:  S/p BiV ICD.  Paced on tele.  7.  DM: resume Janumet @ d/c.  8.  COPD:  Stable.  Home O2 @  night.  9.  Acute on chronic renal failure:  Creat stable.  F/U bmet next week.  Signed, Nicolasa Ducking NP  Patient seen, examined. Available data reviewed. Agree with findings, assessment, and plan as outlined by Ward Givens, NP. Pt independently examined. Discussed findings with the patient and her husband. Exam reveals alert, oriented woman in NAD. Lungs with bibasilar rales and heart is RRR without murmur. There is no peripheral edema. Reviewed extensive hospital records and discussed disposition with patient and husband. Plan home this afternoon with home health. Will decrease home lasix dose to 80 mg BID. Follow-up one week with a BMET in the office.   Tonny Bollman, M.D. 12/23/2012 6:02 PM

## 2012-12-23 NOTE — Progress Notes (Signed)
DC IV, DC Tele, DC Home Discharge instructions and home medications discussed with patient and patient's husband. Patient and husband denied any questions or concerns at this time. Patient leaving unit via wheelchair and appears in no acute distress. 

## 2012-12-23 NOTE — Progress Notes (Signed)
Patient has elected to go home vs SNF for short term rehabilitation.  Spoke with spouse and patient at bedside regarding the benefits of short term rehabilitation sooner verses later.  Both expressed understanding of the risk.  [facility] will retain a copy of the FL2 and bed offers for SNF placement.  We will assist the family and Dr Avbuere's office to direct admit the patient to SNF if her HHPT HHOT is not adequate for recovery.  We will engage with a RN [facility] and LCSW to monitor her progress.  Of note, [facility] Care Management services does not replace or interfere with any services that are arranged by inpatient case management or social work.  For additional questions or referrals please contact Anibal Henderson BSN RN [facility] Liaison at [phone].

## 2012-12-26 NOTE — Clinical Social Work Placement (Signed)
     Clinical Social Work Department CLINICAL SOCIAL WORK PLACEMENT NOTE 12/26/2012  Patient:  [name], [name]  Account Number:  [id number] Admit date:  12/16/2012  Clinical Social Worker:  Sharol Harness, Theresia Majors  Date/time:  12/21/2012 02:20 PM  Clinical Social Work is seeking post-discharge placement for this patient at the following level of care:   SKILLED NURSING   (*CSW will update this form in Epic as items are completed)   12/21/2012  Patient/family provided with [facility] Department of Clinical Social Works list of facilities offering this level of care within the geographic area requested by the patient (or if unable, by the patients family).  12/21/2012  Patient/family informed of their freedom to choose among providers that offer the needed level of care, that participate in Medicare, Medicaid or managed care program needed by the patient, have an available bed and are willing to accept the patient.  12/21/2012  Patient/family informed of [facility] ownership interest in [facility] as well as of the fact that they are under no obligation to receive care at this facility.  PASARR submitted to EDS on 12/21/2012 PASARR number received from EDS on 12/21/2012  FL2 transmitted to all facilities in geographic area requested by pt/family on  12/21/2012 FL2 transmitted to all facilities within larger geographic area on   Patient informed that his/her managed care company has contracts with or will negotiate with  certain facilities, including the following:   NA     Patient/family informed of bed offers received:  12/23/2012 Patient chooses bed at  Physician recommends and patient chooses bed at    Patient to be transferred to  on   Patient to be transferred to facility by   The following physician request were entered in Epic:   Additional Comments: 12//22/14  DC home 12/23/12. Patient was given bed offers but she refuses placement for short term.   "I"m going home."  RNCM aware and arranged for home health. Patient is followed by [facility].  No further CSW needs identified. CW signing off.  Lorri Frederick. Azzam Mehra, LCSWA  [phone]

## 2012-12-30 ENCOUNTER — Ambulatory Visit

## 2012-12-31 ENCOUNTER — Encounter: Admitting: Cardiology

## 2012-12-31 ENCOUNTER — Inpatient Hospital Stay (HOSPITAL_COMMUNITY)
Admission: EM | Admit: 2012-12-31 | Discharge: 2013-01-07 | DRG: 292 | Disposition: A | Discharge status: Skilled Nursing Facility | Payer: Medicare Other | Attending: Internal Medicine | Admitting: Internal Medicine

## 2012-12-31 ENCOUNTER — Emergency Department (HOSPITAL_COMMUNITY): Payer: Medicare Other

## 2012-12-31 ENCOUNTER — Encounter (HOSPITAL_COMMUNITY): Payer: Self-pay | Admitting: Emergency Medicine

## 2012-12-31 DIAGNOSIS — Y846 Urinary catheterization as the cause of abnormal reaction of the patient, or of later complication, without mention of misadventure at the time of the procedure: Secondary | ICD-10-CM | POA: Diagnosis not present

## 2012-12-31 DIAGNOSIS — Z7982 Long term (current) use of aspirin: Secondary | ICD-10-CM

## 2012-12-31 DIAGNOSIS — E871 Hypo-osmolality and hyponatremia: Secondary | ICD-10-CM | POA: Diagnosis present

## 2012-12-31 DIAGNOSIS — J4489 Other specified chronic obstructive pulmonary disease: Secondary | ICD-10-CM | POA: Diagnosis present

## 2012-12-31 DIAGNOSIS — I255 Ischemic cardiomyopathy: Secondary | ICD-10-CM

## 2012-12-31 DIAGNOSIS — I369 Nonrheumatic tricuspid valve disorder, unspecified: Secondary | ICD-10-CM

## 2012-12-31 DIAGNOSIS — I2589 Other forms of chronic ischemic heart disease: Secondary | ICD-10-CM | POA: Diagnosis present

## 2012-12-31 DIAGNOSIS — I2789 Other specified pulmonary heart diseases: Secondary | ICD-10-CM | POA: Diagnosis present

## 2012-12-31 DIAGNOSIS — R5381 Other malaise: Secondary | ICD-10-CM | POA: Diagnosis not present

## 2012-12-31 DIAGNOSIS — I10 Essential (primary) hypertension: Secondary | ICD-10-CM | POA: Diagnosis present

## 2012-12-31 DIAGNOSIS — N390 Urinary tract infection, site not specified: Secondary | ICD-10-CM | POA: Diagnosis not present

## 2012-12-31 DIAGNOSIS — B961 Klebsiella pneumoniae [K. pneumoniae] as the cause of diseases classified elsewhere: Secondary | ICD-10-CM | POA: Diagnosis not present

## 2012-12-31 DIAGNOSIS — N058 Unspecified nephritic syndrome with other morphologic changes: Secondary | ICD-10-CM | POA: Diagnosis present

## 2012-12-31 DIAGNOSIS — M79609 Pain in unspecified limb: Secondary | ICD-10-CM

## 2012-12-31 DIAGNOSIS — E1129 Type 2 diabetes mellitus with other diabetic kidney complication: Secondary | ICD-10-CM | POA: Diagnosis present

## 2012-12-31 DIAGNOSIS — M129 Arthropathy, unspecified: Secondary | ICD-10-CM | POA: Diagnosis present

## 2012-12-31 DIAGNOSIS — J449 Chronic obstructive pulmonary disease, unspecified: Secondary | ICD-10-CM | POA: Diagnosis present

## 2012-12-31 DIAGNOSIS — F329 Major depressive disorder, single episode, unspecified: Secondary | ICD-10-CM | POA: Diagnosis present

## 2012-12-31 DIAGNOSIS — F0390 Unspecified dementia without behavioral disturbance: Secondary | ICD-10-CM | POA: Diagnosis present

## 2012-12-31 DIAGNOSIS — G4733 Obstructive sleep apnea (adult) (pediatric): Secondary | ICD-10-CM | POA: Diagnosis present

## 2012-12-31 DIAGNOSIS — D509 Iron deficiency anemia, unspecified: Secondary | ICD-10-CM | POA: Diagnosis present

## 2012-12-31 DIAGNOSIS — Z95810 Presence of automatic (implantable) cardiac defibrillator: Secondary | ICD-10-CM

## 2012-12-31 DIAGNOSIS — M7989 Other specified soft tissue disorders: Secondary | ICD-10-CM

## 2012-12-31 DIAGNOSIS — I440 Atrioventricular block, first degree: Secondary | ICD-10-CM

## 2012-12-31 DIAGNOSIS — I129 Hypertensive chronic kidney disease with stage 1 through stage 4 chronic kidney disease, or unspecified chronic kidney disease: Secondary | ICD-10-CM | POA: Diagnosis present

## 2012-12-31 DIAGNOSIS — R609 Edema, unspecified: Secondary | ICD-10-CM

## 2012-12-31 DIAGNOSIS — R39198 Other difficulties with micturition: Secondary | ICD-10-CM

## 2012-12-31 DIAGNOSIS — N183 Chronic kidney disease, stage 3 unspecified: Secondary | ICD-10-CM | POA: Diagnosis present

## 2012-12-31 DIAGNOSIS — I252 Old myocardial infarction: Secondary | ICD-10-CM

## 2012-12-31 DIAGNOSIS — T83511A Infection and inflammatory reaction due to indwelling urethral catheter, initial encounter: Secondary | ICD-10-CM | POA: Diagnosis not present

## 2012-12-31 DIAGNOSIS — Z9861 Coronary angioplasty status: Secondary | ICD-10-CM

## 2012-12-31 DIAGNOSIS — E1165 Type 2 diabetes mellitus with hyperglycemia: Secondary | ICD-10-CM | POA: Diagnosis present

## 2012-12-31 DIAGNOSIS — K219 Gastro-esophageal reflux disease without esophagitis: Secondary | ICD-10-CM | POA: Diagnosis present

## 2012-12-31 DIAGNOSIS — R079 Chest pain, unspecified: Secondary | ICD-10-CM

## 2012-12-31 DIAGNOSIS — E785 Hyperlipidemia, unspecified: Secondary | ICD-10-CM | POA: Diagnosis present

## 2012-12-31 DIAGNOSIS — Z885 Allergy status to narcotic agent status: Secondary | ICD-10-CM

## 2012-12-31 DIAGNOSIS — I2510 Atherosclerotic heart disease of native coronary artery without angina pectoris: Secondary | ICD-10-CM | POA: Diagnosis present

## 2012-12-31 DIAGNOSIS — Z880 Allergy status to penicillin: Secondary | ICD-10-CM

## 2012-12-31 DIAGNOSIS — F3289 Other specified depressive episodes: Secondary | ICD-10-CM | POA: Diagnosis present

## 2012-12-31 DIAGNOSIS — I509 Heart failure, unspecified: Secondary | ICD-10-CM | POA: Diagnosis present

## 2012-12-31 DIAGNOSIS — I5023 Acute on chronic systolic (congestive) heart failure: Principal | ICD-10-CM | POA: Diagnosis present

## 2012-12-31 DIAGNOSIS — F17200 Nicotine dependence, unspecified, uncomplicated: Secondary | ICD-10-CM | POA: Diagnosis present

## 2012-12-31 HISTORY — DX: Pain in thoracic spine: M54.6

## 2012-12-31 HISTORY — DX: Shortness of breath: R06.02

## 2012-12-31 HISTORY — DX: Other chronic pain: G89.29

## 2012-12-31 HISTORY — DX: Dorsalgia, unspecified: M54.9

## 2012-12-31 HISTORY — DX: Pneumonia, unspecified organism: J18.9

## 2012-12-31 HISTORY — DX: Type 2 diabetes mellitus without complications: E11.9

## 2012-12-31 HISTORY — DX: Chronic obstructive pulmonary disease, unspecified: J44.9

## 2012-12-31 LAB — URINALYSIS, ROUTINE W REFLEX MICROSCOPIC
Bilirubin Urine: NEGATIVE
Ketones, ur: NEGATIVE mg/dL
Protein, ur: NEGATIVE mg/dL
Specific Gravity, Urine: 1.009 (ref 1.005–1.030)
Urobilinogen, UA: 0.2 mg/dL (ref 0.0–1.0)

## 2012-12-31 LAB — PRO B NATRIURETIC PEPTIDE: Pro B Natriuretic peptide (BNP): 29201 pg/mL — ABNORMAL HIGH (ref 0–125)

## 2012-12-31 LAB — HEMOGLOBIN A1C
Hgb A1c MFr Bld: 6.5 % — ABNORMAL HIGH
Mean Plasma Glucose: 140 mg/dL — ABNORMAL HIGH

## 2012-12-31 LAB — D-DIMER, QUANTITATIVE (NOT AT ARMC): D-Dimer, Quant: 6.78 ug/mL-FEU — ABNORMAL HIGH (ref 0.00–0.48)

## 2012-12-31 LAB — CBC
MCH: 30.7 pg (ref 26.0–34.0)
MCHC: 34.1 g/dL (ref 30.0–36.0)
MCV: 90.1 fL (ref 78.0–100.0)
Platelets: 333 10*3/uL (ref 150–400)
RBC: 3.03 MIL/uL — ABNORMAL LOW (ref 3.87–5.11)
RDW: 19.2 % — ABNORMAL HIGH (ref 11.5–15.5)

## 2012-12-31 LAB — BASIC METABOLIC PANEL
CO2: 23 mEq/L (ref 19–32)
Calcium: 8.7 mg/dL (ref 8.4–10.5)
Creatinine, Ser: 1.24 mg/dL — ABNORMAL HIGH (ref 0.50–1.10)
GFR calc non Af Amer: 44 mL/min — ABNORMAL LOW (ref >90)
Sodium: 124 mEq/L — ABNORMAL LOW (ref 137–147)

## 2012-12-31 LAB — GLUCOSE, CAPILLARY
Glucose-Capillary: 174 mg/dL — ABNORMAL HIGH (ref 70–99)
Glucose-Capillary: 149 mg/dL — ABNORMAL HIGH (ref 70–99)

## 2012-12-31 LAB — PROTIME-INR: Prothrombin Time: 14.5 seconds (ref 11.6–15.2)

## 2012-12-31 LAB — TSH: TSH: 6.259 u[IU]/mL — ABNORMAL HIGH (ref 0.350–4.500)

## 2012-12-31 LAB — POCT I-STAT TROPONIN I: Troponin i, poc: 0.03 ng/mL (ref 0.00–0.08)

## 2012-12-31 LAB — MAGNESIUM: Magnesium: 1.7 mg/dL (ref 1.5–2.5)

## 2012-12-31 MED ORDER — MAGNESIUM OXIDE 400 MG PO TABS: 400.0000 mg | ORAL_TABLET | Freq: Every day | ORAL | Status: DC

## 2012-12-31 MED ORDER — ATORVASTATIN CALCIUM 40 MG PO TABS
40.0000 mg | ORAL_TABLET | Freq: Every day | ORAL | Status: DC
  Administered 2012-12-31 – 2013-01-06 (×6): 40 mg via ORAL
  Filled 2012-12-31 (×8): qty 1

## 2012-12-31 MED ORDER — SODIUM CHLORIDE 0.9 % IJ SOLN: 3.0000 mL | INTRAMUSCULAR | Status: DC | PRN

## 2012-12-31 MED ORDER — ONDANSETRON HCL 4 MG/2ML IJ SOLN
4.0000 mg | Freq: Four times a day (QID) | INTRAMUSCULAR | Status: DC | PRN
  Administered 2012-12-31: 4 mg via INTRAVENOUS
  Filled 2012-12-31: qty 2

## 2012-12-31 MED ORDER — GABAPENTIN 300 MG PO CAPS
300.0000 mg | ORAL_CAPSULE | Freq: Two times a day (BID) | ORAL | Status: DC
  Administered 2013-01-01 – 2013-01-07 (×14): 300 mg via ORAL
  Filled 2012-12-31 (×15): qty 1

## 2012-12-31 MED ORDER — LOSARTAN POTASSIUM 50 MG PO TABS
50.0000 mg | ORAL_TABLET | Freq: Every day | ORAL | Status: DC
  Administered 2013-01-01 – 2013-01-07 (×7): 50 mg via ORAL
  Filled 2012-12-31 (×9): qty 1

## 2012-12-31 MED ORDER — SPIRONOLACTONE 25 MG PO TABS
25.0000 mg | ORAL_TABLET | Freq: Every day | ORAL | Status: DC
  Administered 2012-12-31 – 2013-01-07 (×8): 25 mg via ORAL
  Filled 2012-12-31 (×8): qty 1

## 2012-12-31 MED ORDER — CARVEDILOL 6.25 MG PO TABS
6.2500 mg | ORAL_TABLET | Freq: Two times a day (BID) | ORAL | Status: DC
  Administered 2012-12-31 – 2013-01-07 (×13): 6.25 mg via ORAL
  Filled 2012-12-31 (×16): qty 1

## 2012-12-31 MED ORDER — ACETAMINOPHEN 500 MG PO TABS
500.0000 mg | ORAL_TABLET | Freq: Four times a day (QID) | ORAL | Status: DC | PRN
  Administered 2013-01-03 – 2013-01-04 (×2): 500 mg via ORAL
  Filled 2012-12-31 (×2): qty 1

## 2012-12-31 MED ORDER — FERRAPLUS 90 90-1 MG PO TABS: 1.0000 | ORAL_TABLET | Freq: Every day | ORAL | Status: DC

## 2012-12-31 MED ORDER — TIOTROPIUM BROMIDE MONOHYDRATE 18 MCG IN CAPS
18.0000 ug | ORAL_CAPSULE | Freq: Every day | RESPIRATORY_TRACT | Status: DC
  Administered 2013-01-01 – 2013-01-07 (×6): 18 ug via RESPIRATORY_TRACT
  Filled 2012-12-31 (×2): qty 5

## 2012-12-31 MED ORDER — ISOSORBIDE MONONITRATE ER 60 MG PO TB24
60.0000 mg | ORAL_TABLET | Freq: Every day | ORAL | Status: DC
  Administered 2013-01-01 – 2013-01-07 (×7): 60 mg via ORAL
  Filled 2012-12-31 (×7): qty 1

## 2012-12-31 MED ORDER — HYDROXYZINE HCL 10 MG PO TABS
10.0000 mg | ORAL_TABLET | Freq: Two times a day (BID) | ORAL | Status: DC
  Administered 2013-01-01 – 2013-01-07 (×13): 10 mg via ORAL
  Filled 2012-12-31 (×15): qty 1

## 2012-12-31 MED ORDER — FA-PYRIDOXINE-CYANOCOBALAMIN 2.5-25-2 MG PO TABS
1.0000 | ORAL_TABLET | Freq: Every day | ORAL | Status: DC
  Administered 2013-01-01 – 2013-01-07 (×8): 1 via ORAL
  Filled 2012-12-31 (×8): qty 1

## 2012-12-31 MED ORDER — ASPIRIN EC 81 MG PO TBEC
81.0000 mg | DELAYED_RELEASE_TABLET | Freq: Every day | ORAL | Status: DC
  Administered 2012-12-31 – 2013-01-07 (×8): 81 mg via ORAL
  Filled 2012-12-31 (×8): qty 1

## 2012-12-31 MED ORDER — TORSEMIDE 20 MG PO TABS
40.0000 mg | ORAL_TABLET | Freq: Two times a day (BID) | ORAL | Status: DC
  Administered 2012-12-31 – 2013-01-01 (×4): 40 mg via ORAL
  Filled 2012-12-31 (×7): qty 2

## 2012-12-31 MED ORDER — SODIUM CHLORIDE 0.9 % IJ SOLN
3.0000 mL | Freq: Two times a day (BID) | INTRAMUSCULAR | Status: DC
  Administered 2012-12-31 – 2013-01-06 (×14): 3 mL via INTRAVENOUS

## 2012-12-31 MED ORDER — ALBUTEROL SULFATE (2.5 MG/3ML) 0.083% IN NEBU
2.5000 mg | INHALATION_SOLUTION | Freq: Four times a day (QID) | RESPIRATORY_TRACT | Status: DC | PRN
  Administered 2012-12-31 – 2013-01-04 (×2): 2.5 mg via RESPIRATORY_TRACT
  Filled 2012-12-31 (×3): qty 3

## 2012-12-31 MED ORDER — TRAMADOL HCL 50 MG PO TABS
50.0000 mg | ORAL_TABLET | Freq: Four times a day (QID) | ORAL | Status: DC | PRN
  Administered 2012-12-31: 100 mg via ORAL
  Filled 2012-12-31: qty 2

## 2012-12-31 MED ORDER — TORSEMIDE 20 MG PO TABS: 40.0000 mg | ORAL_TABLET | Freq: Two times a day (BID) | ORAL | Status: DC

## 2012-12-31 MED ORDER — ASPIRIN 81 MG PO CHEW: 324.0000 mg | CHEWABLE_TABLET | Freq: Once | ORAL | Status: DC

## 2012-12-31 MED ORDER — TRAZODONE HCL 150 MG PO TABS
150.0000 mg | ORAL_TABLET | Freq: Every day | ORAL | Status: DC
  Administered 2013-01-01 – 2013-01-06 (×4): 150 mg via ORAL
  Filled 2012-12-31 (×9): qty 1

## 2012-12-31 MED ORDER — PANTOPRAZOLE SODIUM 40 MG PO TBEC
80.0000 mg | DELAYED_RELEASE_TABLET | Freq: Every day | ORAL | Status: DC
  Administered 2012-12-31 – 2013-01-07 (×8): 80 mg via ORAL
  Filled 2012-12-31 (×10): qty 2

## 2012-12-31 MED ORDER — ENOXAPARIN SODIUM 80 MG/0.8ML ~~LOC~~ SOLN
70.0000 mg | Freq: Two times a day (BID) | SUBCUTANEOUS | Status: DC
  Administered 2012-12-31 – 2013-01-01 (×2): 70 mg via SUBCUTANEOUS
  Filled 2012-12-31 (×4): qty 0.8

## 2012-12-31 MED ORDER — MAGNESIUM OXIDE 400 (241.3 MG) MG PO TABS
400.0000 mg | ORAL_TABLET | Freq: Every day | ORAL | Status: DC
  Administered 2013-01-01 – 2013-01-07 (×8): 400 mg via ORAL
  Filled 2012-12-31 (×9): qty 1

## 2012-12-31 MED ORDER — INSULIN ASPART 100 UNIT/ML ~~LOC~~ SOLN
0.0000 [IU] | Freq: Three times a day (TID) | SUBCUTANEOUS | Status: DC
Start: 2012-12-31 — End: 2013-01-07
  Administered 2013-01-01 – 2013-01-02 (×2): 2 [IU] via SUBCUTANEOUS
  Administered 2013-01-03 (×2): 3 [IU] via SUBCUTANEOUS
  Administered 2013-01-04: 1 [IU] via SUBCUTANEOUS
  Administered 2013-01-04 – 2013-01-05 (×2): 2 [IU] via SUBCUTANEOUS
  Administered 2013-01-05: 3 [IU] via SUBCUTANEOUS
  Administered 2013-01-06 – 2013-01-07 (×2): 1 [IU] via SUBCUTANEOUS
  Administered 2013-01-07: 2 [IU] via SUBCUTANEOUS

## 2012-12-31 MED ORDER — NITROGLYCERIN 0.4 MG SL SUBL: 0.4000 mg | SUBLINGUAL_TABLET | SUBLINGUAL | Status: DC | PRN

## 2012-12-31 MED ORDER — TORSEMIDE 50 MG/5ML IV SOLN
40.0000 mg | Freq: Once | INTRAVENOUS | Status: DC
  Filled 2012-12-31: qty 4

## 2012-12-31 MED ORDER — SODIUM CHLORIDE 0.9 % IV SOLN: 250.0000 mL | INTRAVENOUS | Status: DC | PRN

## 2012-12-31 MED ORDER — FUROSEMIDE 10 MG/ML IJ SOLN
80.0000 mg | Freq: Once | INTRAMUSCULAR | Status: AC
  Administered 2012-12-31: 80 mg via INTRAVENOUS
  Filled 2012-12-31: qty 8

## 2012-12-31 MED ORDER — GLUCERNA SHAKE PO LIQD: 237.0000 mL | ORAL | Status: DC | PRN

## 2012-12-31 MED ORDER — VENLAFAXINE HCL ER 150 MG PO CP24
150.0000 mg | ORAL_CAPSULE | Freq: Every morning | ORAL | Status: DC
  Administered 2013-01-01 – 2013-01-07 (×8): 150 mg via ORAL
  Filled 2012-12-31 (×9): qty 1

## 2012-12-31 NOTE — Evaluation (Addendum)
Physical Therapy Evaluation Patient Details Name: [name] MRN: [id number] DOB: 05/11/1945 Today's Date: 12/31/2012 Time: 2725-3664 PT Time Calculation (min): 24 min  PT Assessment / Plan / Recommendation History of Present Illness  [name] is a 67 y.o. female with a history of CAD, chronic systolic CHF (EF 40-34%), syncope in the setting of 2:1 heart block s/p ICD placement, COPD- on home night O2, T2DM, iron deficiency anemia, HTN, and dementia who presented to the ED today complaining of chest pain and SOB.  Clinical Impression  Pt admitted with SOB and CP.  Now pain resolved, but SOB remains.  Pt presents deconditioned and rather weak showing difficulty mobilizing in/out of bed and up on her feet.  Pt really needs to go to SNF for rehab to build strength and conditioning.    PT Assessment  Patient needs continued PT services    Follow Up Recommendations  SNF    Does the patient have the potential to tolerate intense rehabilitation      Barriers to Discharge Decreased caregiver support      Equipment Recommendations  None recommended by PT    Recommendations for Other Services     Frequency Min 3X/week    Precautions / Restrictions Precautions Precautions: Fall;ICD/Pacemaker   Pertinent Vitals/Pain       Mobility  Bed Mobility Bed Mobility: Sit to Supine Sit to Supine: 3: Mod assist;HOB elevated Details for Bed Mobility Assistance: assistance for positioning in supine Transfers Transfers: Sit to Stand;Stand to Sit Sit to Stand: 3: Mod assist;From bed Stand to Sit: 4: Min assist;To bed Details for Transfer Assistance: vc's for safety with hand placement; lifting assist Ambulation/Gait Ambulation/Gait Assistance: 4: Min assist Ambulation Distance (Feet): 18 Feet Assistive device: Rolling walker;1 person hand held assist Ambulation/Gait Assistance Details: weak, excessively extended knee gait.  Gait Pattern: Step-through pattern;Decreased step  length - right;Decreased step length - left;Decreased stride length;Decreased hip/knee flexion - right;Decreased hip/knee flexion - left;Shuffle Stairs: No    Exercises  General ex in sitting-- hip flexion, knee extension, heel and toe raises x 10 rep bilaterally   PT Diagnosis: Difficulty walking;Generalized weakness  PT Problem List: Decreased strength;Decreased activity tolerance;Decreased balance;Decreased mobility;Decreased knowledge of use of DME;Decreased knowledge of precautions;Cardiopulmonary status limiting activity PT Treatment Interventions: Gait training;DME instruction;Functional mobility training;Therapeutic activities;Balance training;Patient/family education     PT Goals(Current goals can be found in the care plan section) Acute Rehab PT Goals Patient Stated Goal: Eventually home PT Goal Formulation: With patient Time For Goal Achievement: 01/14/13 Potential to Achieve Goals: Good  Visit Information  Last PT Received On: 12/31/12 Assistance Needed: +2 (for safety, O2) History of Present Illness: [name] is a 67 y.o. female with a history of CAD, chronic systolic CHF (EF 74-25%), syncope in the setting of 2:1 heart block s/p ICD placement, COPD- on home night O2, T2DM, iron deficiency anemia, HTN, and dementia who presented to the ED today complaining of chest pain and SOB.       Prior Functioning  Home Living Family/patient expects to be discharged to:: Skilled nursing facility Living Arrangements: Spouse/significant other Available Help at Discharge: Family Type of Home: House Home Access: Stairs to enter Home Layout: One level Home Equipment: Environmental consultant - 4 wheels Prior Function Level of Independence: Needs assistance Gait / Transfers Assistance Needed: husband reports pt walks rarely lately ADL's / Homemaking Assistance Needed: spouse has been bathing and dressing her as well as all housework Communication Communication: No difficulties  Cognition  Cognition Arousal/Alertness: Awake/alert Behavior During Therapy: WFL for tasks assessed/performed Overall Cognitive Status: Within Functional Limits for tasks assessed Following Commands: Follows one step commands consistently Problem Solving: Slow processing    Extremity/Trunk Assessment Upper Extremity Assessment Upper Extremity Assessment: Generalized weakness (especially extention and L weaker than R) Lower Extremity Assessment Lower Extremity Assessment: Generalized weakness (grossly 3 to 3+/5 hip flexion 3-/5) Cervical / Trunk Assessment Cervical / Trunk Assessment: Normal   Balance Balance Balance Assessed: Yes Static Sitting Balance Static Sitting - Balance Support: Feet supported;Right upper extremity supported;Left upper extremity supported Static Sitting - Level of Assistance: 6: Modified independent (Device/Increase time) Static Sitting - Comment/# of Minutes: 10 minutes during eval.  pt unable to maintain balance against mina  End of Session PT - End of Session Equipment Utilized During Treatment: Gait belt;Oxygen Activity Tolerance: Patient tolerated treatment well Patient left: in bed Nurse Communication: Mobility status;Precautions  GP     Adonay Scheier, Eliseo Gum 12/31/2012, 6:14 PM 12/31/2012  [place] Bing, PT [phone]  (pager)

## 2012-12-31 NOTE — Progress Notes (Signed)
Called to get report for transfer. RN busy at this time. Awaiting call back.[name]

## 2012-12-31 NOTE — Progress Notes (Signed)
  Echocardiogram 2D Echocardiogram has been performed.  [name] 12/31/2012, 1:25 PM

## 2012-12-31 NOTE — ED Notes (Signed)
Echo in progress at this time.

## 2012-12-31 NOTE — ED Notes (Signed)
Patient began having lower left chest pain that is non-radiating approximately 90 minutes ago. Patient is also experiencing left upper quadrant abdominal pain. PAtient has equal pulses and pressures in bilateral upper extremities. Patient does have a pacemaker. Patient took nitro x1 at home before EMS arrival and states that she got some relief from that. EMS gave 324 ASA. PAtient has 3+ pitting Edema in right lower extremity and 1+ pitting Edema in left lower extremity. BP 140/100 HR 88 and RR 20. Denies n/v. Patient is a/o x4 NAD.

## 2012-12-31 NOTE — ED Provider Notes (Signed)
CSN: [id number]     Arrival date & time 12/31/12  0605 History   First MD Initiated Contact with Patient 12/31/12 [phone]     Chief Complaint  Patient presents with  . Chest Pain   (Consider location/radiation/quality/duration/timing/severity/associated sxs/prior Treatment) HPI Comments: Patient with a history of MI s/p stent, COPD, HTN, Hyperlipidemia, DM,  ICD in place, CHF last EF 10-15% presents today with a chief complaint of chest pain and SOB.  She reports that chest pain has been present since yesterday afternoon and has been constant since that time.  She reports that the pain is located just inferior to her left breast and does not radiate.  She took NG x 1 earlier today with mild relief.  She was also given Aspirin with mild temporary relief.  She reports that her chest pain is associated with SOB.  She also reports that she has had increased edema of her lower extremities and orthopnea over the last couple of days.  She does not check her daily weights.  She states that she currently takes Lasix 80 mg orally twice a day.  She states that her last dose was last evening.  She denies numbness, tingling, nausea, vomiting, dizziness, lightheadedness or syncope.  Denies cough, fever, or chills.  She is currently followed by [name] with [facility].  Patient was recently hospitalized on 12/16/12 and discharged on 12/18/12 due to ischemic cardiomyopathy and syncope.  At that time she had an ICD placed  The history is provided by the patient.    Past Medical History  Diagnosis Date  . DM   . DYSLIPIDEMIA   . OBESITY NOS   . DEPRESSION, RECURRENT, IN PARTIAL REMISSION   . HYPERTENSION   . CORONARY ARTERY DISEASE     a. s/p Q wave MI 2002 with RCA stent x 2. b. Abnl nuc 04/2012 with subsequent cath showing patent stents in RCA, chronic diffuse disease in dLAD, 20% LM, EF 20%, no focal targets for PCI.  [name] GERD   . DIVERTICULOSIS, COLON, WITH HEMORRHAGE   . BACK PAIN, LUMBAR, WITH  RADICULOPATHY   . OBSTRUCTIVE SLEEP APNEA   . Anemia, iron deficiency   . Hyponatremia   . Tobacco abuse   . Moderate COPD (chronic obstructive pulmonary disease) 11/07/2005    Spirometry 03/09/11>>FEV1 2.02 (88%), FEV1% 76    . Anxiety   . Arthritis   . Myocardial infarction 2001  . Dementia     a. Saw neuro 08/2012: concerning for early dementia, labs unremarkable except elev CRP.  [name] Chronic edema   . Weight loss     CT abd pelvis 05/2012: mild small bowel disention. No significant acute changes. Mod-severe gastritis by EGD 06/06/2012 no clear neoplastic changes. Neg small bowel follow through except for diverticulum on 06/11/12.   . Gastritis     a. by EGD 05/2012.  [name] Chronic systolic CHF (congestive heart failure)     a. Last EF 20% by cath 04/29/2012, 40-45% by echo 04/24/12.;  b.  Echocardiogram  (11/21/12): EF 10-15%, severe diffuse HK, moderate MR, moderate LAE, mild RVE, moderately reduced RVSF, moderate to severe RAE, moderate TR, PASP 56  . Ischemic cardiomyopathy   . Syncope     a. 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 BB discontinued. b. Recurrence in 11/2012.=> Life Vest placed pending ICD placmement   . 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 12/2012 SJM BiV ICD.   Past Surgical History  Procedure  Laterality Date  . Uvuloplasty    . Total abdominal hysterectomy w/ bilateral salpingoophorectomy    . Abdominal hysterectomy    . Cholecystectomy  2004  . Coronary angioplasty with stent placement  2002    Thrombectomy; prox RCA 3.5 x 15 mm NIR stent; mid RCA PTCA; distal RCA 3.5 x 15 mm NIR stent;     . Esophagogastroduodenoscopy (egd) with propofol N/A 06/06/2012    Procedure: ESOPHAGOGASTRODUODENOSCOPY (EGD) WITH PROPOFOL;  Surgeon: Rachael Fee, MD;  Location: [facility];  Service: Endoscopy;  Laterality: N/A;  . Cardiac defibrillator placement  12/16/2012    dual chamber  DR Graciela Husbands   Family History  Problem Relation Age  of Onset  . Stroke Mother   . Breast cancer Sister   . Emphysema Sister   . Cancer Sister     breast  . Alcohol abuse Brother   . Colon cancer      uncles x 2  . Prostate cancer Brother    History  Substance Use Topics  . Smoking status: Current Some Day Smoker -- 0.25 packs/day for 20 years    Types: Cigarettes    Last Attempt to Quit: 07/17/2012  . Smokeless tobacco: Never Used     Comment: Still smokes a little.  . Alcohol Use: No     Comment: Patient drinks caffeinated beverages daily.   OB History   Grav Para Term Preterm Abortions TAB SAB Ect Mult Living                 Review of Systems  Respiratory: Positive for shortness of breath.   Cardiovascular: Positive for chest pain and leg swelling.  All other systems reviewed and are negative.    Allergies  Amlodipine; Ramipril; Adhesive; Morphine and related; Penicillins; and Codeine  Home Medications   Current Outpatient Rx  Name  Route  Sig  Dispense  Refill  . acetaminophen (TYLENOL) 500 MG tablet   Oral   Take 500 mg by mouth every 6 (six) hours as needed for pain.         [name] albuterol (PROVENTIL) (2.5 MG/3ML) 0.083% nebulizer solution   Nebulization   Take 2.5 mg by nebulization every 6 (six) hours as needed for wheezing.         [name] aspirin 81 MG tablet   Oral   Take 81 mg by mouth daily.           [name] atorvastatin (LIPITOR) 40 MG tablet   Oral   Take 40 mg by mouth every morning.         . carvedilol (COREG) 12.5 MG tablet   Oral   Take 0.5 tablets (6.25 mg total) by mouth 2 (two) times daily with a meal.   60 tablet   6   . diclofenac sodium (VOLTAREN) 1 % GEL   Topical   Apply 2 g topically 4 (four) times daily.         . feeding supplement (GLUCERNA SHAKE) LIQD   Oral   Take 237 mLs by mouth as needed.          . gabapentin (NEURONTIN) 300 MG capsule   Oral   Take 1 capsule (300 mg total) by mouth 2 (two) times daily.   180 capsule   3   . hydrOXYzine (ATARAX/VISTARIL) 10  MG tablet   Oral   Take 1 tablet (10 mg total) by mouth 2 (two) times daily.   180 tablet   3   .  Iron-Folic Acid-B12-C-Docusate (FERRAPLUS 90) 90-1 MG TABS   Oral   Take 1 tablet by mouth daily.   90 tablet   3   . isosorbide mononitrate (IMDUR) 60 MG 24 hr tablet   Oral   Take 60 mg by mouth daily at 12 noon.         [employer] (SIMPLE DIAGNOSTICS LANCING DEV) MISC               . losartan (COZAAR) 50 MG tablet   Oral   Take 1 tablet (50 mg total) by mouth daily.   90 tablet   3   . magnesium oxide (MAG-OX) 400 MG tablet   Oral   Take 1 tablet (400 mg total) by mouth daily.   30 tablet   0   . nitroGLYCERIN (NITROSTAT) 0.4 MG SL tablet   Sublingual   Place 1 tablet (0.4 mg total) under the tongue every 5 (five) minutes x 3 doses as needed for chest pain.   25 tablet   3   . omeprazole (PRILOSEC) 40 MG capsule   Oral   Take 40 mg by mouth daily.         . polyethylene glycol (MIRALAX / GLYCOLAX) packet   Oral   Take 17 g by mouth daily as needed (for constipation).         . sitaGLIPtan-metformin (JANUMET) 50-500 MG per tablet   Oral   Take 1 tablet by mouth 2 (two) times daily with a meal.         . SPIRIVA HANDIHALER 18 MCG inhalation capsule      USE 1 CAPSULE VIA HANDIHALER EVERY DAY   30 capsule   0     NEEDS OV   . spironolactone (ALDACTONE) 25 MG tablet   Oral   Take 1 tablet (25 mg total) by mouth daily.   30 tablet   6   . tiotropium (SPIRIVA) 18 MCG inhalation capsule   Inhalation   Place 18 mcg into inhaler and inhale daily.         . traMADol (ULTRAM) 50 MG tablet   Oral   Take 50-100 mg by mouth every 6 (six) hours as needed for moderate pain.         . traZODone (DESYREL) 100 MG tablet   Oral   Take 150 mg by mouth at bedtime.         [name] venlafaxine XR (EFFEXOR-XR) 150 MG 24 hr capsule   Oral   Take 1 capsule (150 mg total) by mouth every morning.   90 capsule   3    BP 138/85  Pulse 86  Temp(Src)  98.5 F (36.9 C) (Oral)  Resp 13  SpO2 95% Physical Exam  Nursing note and vitals reviewed. Constitutional: She appears well-developed and well-nourished.  HENT:  Head: Normocephalic and atraumatic.  Mouth/Throat: Oropharynx is clear and moist.  Neck: Normal range of motion. Neck supple. JVD present.  Cardiovascular: Normal rate, regular rhythm, normal heart sounds and intact distal pulses.   Pulmonary/Chest: Effort normal and breath sounds normal. No respiratory distress. She has no wheezes. She has no rales. She exhibits tenderness.  Abdominal: Soft. Bowel sounds are normal.  Musculoskeletal:  2+ pitting edema bilaterally from the knee distally  Neurological: She is alert.  Skin: Skin is warm and dry.  Psychiatric: She has a normal mood and affect.    ED Course  Procedures (including critical care time) Labs Review Labs Reviewed  CBC - Abnormal; Notable for the following:    RBC 3.03 (*)    Hemoglobin 9.3 (*)    HCT 27.3 (*)    RDW 19.2 (*)    All other components within normal limits  BASIC METABOLIC PANEL - Abnormal; Notable for the following:    Sodium 124 (*)    Chloride 86 (*)    Glucose, Bld 124 (*)    Creatinine, Ser 1.24 (*)    GFR calc non Af Amer 44 (*)    GFR calc Af Amer 51 (*)    All other components within normal limits  PRO B NATRIURETIC PEPTIDE - Abnormal; Notable for the following:    Pro B Natriuretic peptide (BNP) 29201.0 (*)    All other components within normal limits  D-DIMER, QUANTITATIVE - Abnormal; Notable for the following:    D-Dimer, Quant 6.78 (*)    All other components within normal limits  URINALYSIS, ROUTINE W REFLEX MICROSCOPIC  TSH  MAGNESIUM  HEMOGLOBIN A1C  POCT I-STAT TROPONIN I   Imaging Review Dg Chest Port 1 View  12/31/2012   CLINICAL DATA:  Lower left chest and abdominal pain.  EXAM: PORTABLE CHEST - 1 VIEW  COMPARISON:  Chest radiograph December 18, 2012  FINDINGS: The cardiac silhouette appears at moderate to  severely enlarged, similar. Mildly calcified aortic knob. Triple lead left cardiac defibrillator in situ. Mild chronic interstitial changes, decreased right costophrenic angle effusion. No focal consolidations. Minimal residual central pulmonary vasculature congestion. No pneumothorax.  Subacute appearing left humeral head fracture.  IMPRESSION: Stable cardiomegaly, at decreased pulmonary edema with minimal residual central pulmonary vasculature congestion. Mild chronic interstitial changes with small residual right lung base pleural effusion .   Electronically Signed   By: Awilda Metro   On: 12/31/2012 06:46    EKG Interpretation    Date/Time:  Tuesday December 31 2012 06:13:38 EST Ventricular Rate:  86 PR Interval:  212 QRS Duration: 145 QT Interval:  436 QTC Calculation: 521 R Axis:   -46 Text Interpretation:  Age not entered, assumed to be  67 years old for purpose of ECG interpretation Sinus rhythm Prolonged PR interval LBBB - electronic pacemaker No change from prior Confirmed by Gwendolyn Grant  MD, BLAIR ([id number]) on 12/31/2012 10:05:16 AM           8:22 AM Discussed with [facility].  They report that they will be down to see the patient.  8:50 AM Reassessed patient.  She reports that her pain has improved at this time, but reports that she continues to feel short of breath.  MDM  No diagnosis found. Patient with a significant cardiac history followed by [facility] presents today with a chief complaint of SOB and chest pain, which has been present since yesterday.  No ischemic changes on EKG.  Initial troponin negative. No acute changes on CXR.  Patient appears to be in volume overload.  [place] is [address].  Patient also complaining of increased LE edema and orthopnea.  Patient given 80 mg IV Lasix in the ED.  Cardiology consulted and admitted the patient for additional management and treatment.    Santiago Glad, PA-C 12/31/12 [phone]

## 2012-12-31 NOTE — Progress Notes (Signed)
VASCULAR LAB PRELIMINARY  PRELIMINARY  PRELIMINARY  PRELIMINARY  Bilateral lower extremity venous duplex completed.    Preliminary report: Negative for deep and superficial vein thrombosis bilaterally.  Jolonda Gomm, RVT 12/31/2012, 1:23 PM

## 2012-12-31 NOTE — ED Notes (Signed)
Patient to us at this time.

## 2012-12-31 NOTE — H&P (Signed)
Patient ID: [name] MRN: [id number], DOB/AGE: Jun 14, 1945   Admit date: 12/31/2012   Primary Physician: [name], MD Primary Cardiologist: [name] / Dr. Graciela Husbands (EP)  Pt. Profile: [name] is a 67 y.o. 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 She has a history of CAD s/p STEMI in 2002 with RCA stent x 2. Abnl nuc 04/2012 with subsequent cath showing patent stents in RCA, chronic diffuse disease in dLAD, 20% LM, EF 20%, no focal targets for PCI. ECHO in 11/2012 revealed severe LV dysfunction (EF 10-15%) with severe diffuse hypokinesis. Moderate MR and moderate TR. PA pressure mod increased. Mod-severe RA dilation and mildly reduced RV systolic function. She is s/p BiV AICD placement 12/16/12 by Dr. 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 She had post procedure complications including aspiration/hypoxia as well as urinary retention and was admitted to [facility] from 12/15-12/23. Today she complains of chest pain that started last night. It was sharp and stabbing pain in left belly and underneath the left breast. It was 9/10 pain that lasted for about 1 hour and was partially relieved by NTG. Aspirin eased pain as well. Pain was associated with marked SOB and was worse with inspiration; reports associated nausea and lightheadedness. It is not reminiscent of previous cardiac chest pain. She denies palpations or fevers. She admits to chills and worsening LE edema R>L, lower extremity pain, worsening orthopnea and PND. Currently she is mildly SOB with no CP.   BNP 29K (baseline 1-2K), troponin neg x1, NA 124, CL 86, Cr 1.24. H/H 9.3/27.3  CXR IMPRESSION: Stable cardiomegaly, at decreased pulmonary edema with minimal residual  central pulmonary vasculature congestion. Mild chronic interstitial changes with small residual right lung base pleural effusion   Problem List  Past Medical History  Diagnosis Date  . DM   . DYSLIPIDEMIA   . OBESITY NOS   . DEPRESSION, RECURRENT, IN PARTIAL REMISSION   . HYPERTENSION   . CORONARY ARTERY DISEASE     a. s/p Q wave MI 2002 with RCA stent x 2. b. Abnl nuc 04/2012 with subsequent cath showing patent stents in RCA, chronic diffuse disease in dLAD, 20% LM, EF 20%, no focal targets for PCI.  [name] GERD   . DIVERTICULOSIS, COLON, WITH HEMORRHAGE   . BACK PAIN, LUMBAR, WITH RADICULOPATHY   . OBSTRUCTIVE SLEEP APNEA   . Anemia, iron deficiency   . Hyponatremia   . Tobacco abuse   . Moderate COPD (chronic obstructive pulmonary disease) 11/07/2005    Spirometry 03/09/11>>FEV1 2.02 (88%), FEV1% 76    . Anxiety   . Arthritis   . Myocardial infarction 2001  . Dementia     a. Saw neuro 08/2012: concerning for early dementia, labs unremarkable except elev CRP.  [name] Chronic edema   . Weight loss     CT abd pelvis 05/2012: mild small bowel disention. No significant acute changes. Mod-severe gastritis by EGD 06/06/2012 no clear neoplastic changes. Neg small bowel follow through except for diverticulum on 06/11/12.   . Gastritis     a. by EGD 05/2012.  [name] Chronic systolic CHF (congestive heart failure)     a. Last EF 20% by cath 04/29/2012, 40-45% by echo 04/24/12.;  b.  Echocardiogram  (11/21/12): EF 10-15%, severe diffuse HK, moderate MR, moderate LAE, mild RVE, moderately reduced RVSF, moderate to severe RAE, moderate TR, PASP 56  . Ischemic cardiomyopathy   . Syncope     a. 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 BB discontinued. b. Recurrence in 11/2012.=> Life Vest placed pending ICD placmement   . 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 12/2012 SJM BiV ICD.    Past Surgical History  Procedure Laterality Date  . Uvuloplasty    . Total  abdominal hysterectomy w/ bilateral salpingoophorectomy    . Abdominal hysterectomy    . Cholecystectomy  2004  . Coronary angioplasty with stent placement  2002    Thrombectomy; prox RCA 3.5 x 15 mm NIR stent; mid RCA PTCA; distal RCA 3.5 x 15 mm NIR stent;     . Esophagogastroduodenoscopy (egd) with propofol N/A 06/06/2012    Procedure: ESOPHAGOGASTRODUODENOSCOPY (EGD) WITH PROPOFOL;  Surgeon: Rachael Fee, MD;  Location: [facility] ENDOSCOPY;  Service: Endoscopy;  Laterality: N/A;  . Cardiac defibrillator placement  12/16/2012    dual chamber  DR Graciela Husbands     Allergies  Allergies  Allergen Reactions  . Amlodipine Other (See Comments)    constipation  . Ramipril Cough  . Adhesive [Tape] Hives    "peels skin off"  . Morphine And Related Other (See Comments)    Per Husband, Pt has hallucinations  . Penicillins Itching  . Codeine Rash    Home Medications  Prior to Admission medications   Medication Sig Start Date End Date Taking? Authorizing Provider  acetaminophen (TYLENOL) 500 MG tablet Take 500 mg by mouth every 6 (six) hours as needed for pain.   Yes Historical Provider, MD  albuterol (PROVENTIL) (2.5 MG/3ML) 0.083% nebulizer solution Take 2.5 mg by nebulization every 6 (six) hours as needed for wheezing.   Yes Historical Provider, MD  aspirin 81 MG tablet Take 81 mg by mouth daily.     Yes Historical Provider, MD  atorvastatin (LIPITOR) 40 MG tablet Take 40 mg by mouth every morning.   Yes Historical Provider, MD  carvedilol (COREG) 12.5 MG tablet Take 0.5 tablets (6.25 mg total) by mouth 2 (two) times daily with a meal. 12/23/12  Yes Ok Anis, NP  diclofenac sodium (VOLTAREN) 1 % GEL Apply 2 g topically 4 (four) times daily. 06/12/12  Yes Karen Prueter, PA-C  feeding supplement (GLUCERNA SHAKE) LIQD Take 237 mLs by mouth as needed.    Yes Historical Provider, MD  gabapentin (NEURONTIN) 300 MG capsule Take 1 capsule (300 mg total) by mouth 2 (two) times daily. 12/03/12  Yes  [name], MD  hydrOXYzine (ATARAX/VISTARIL) 10 MG tablet Take 1 tablet (10 mg total) by mouth 2 (two) times daily. 12/03/12  Yes [name], MD  Iron-Folic Acid-B12-C-Docusate (FERRAPLUS 90) 90-1 MG TABS Take 1 tablet by mouth daily. 10/30/12  Yes [name], MD  isosorbide mononitrate (IMDUR) 60 MG 24 hr tablet Take 60 mg by mouth daily at 12 noon.   Yes Historical Provider, MD  Lancet Devices (SIMPLE DIAGNOSTICS LANCING DEV) MISC  10/01/12  Yes Historical Provider, MD  losartan (COZAAR) 50 MG tablet Take 1 tablet (50 mg total) by mouth daily. 10/30/12  Yes [name], MD  magnesium oxide (MAG-OX) 400 MG tablet Take 1 tablet (400 mg total) by mouth daily. 11/23/12  Yes Leroy Sea, MD  nitroGLYCERIN (NITROSTAT) 0.4 MG SL tablet Place 1  tablet (0.4 mg total) under the tongue every 5 (five) minutes x 3 doses as needed for chest pain. 04/30/12  Yes Rhonda G Barrett, PA-C  omeprazole (PRILOSEC) 40 MG capsule Take 40 mg by mouth daily.   Yes Historical Provider, MD  polyethylene glycol (MIRALAX / GLYCOLAX) packet Take 17 g by mouth daily as needed (for constipation).   Yes Historical Provider, MD  sitaGLIPtan-metformin (JANUMET) 50-500 MG per tablet Take 1 tablet by mouth 2 (two) times daily with a meal.   Yes Historical Provider, MD  SPIRIVA HANDIHALER 18 MCG inhalation capsule USE 1 CAPSULE VIA HANDIHALER EVERY DAY 12/23/12  Yes [name], MD  spironolactone (ALDACTONE) 25 MG tablet Take 1 tablet (25 mg total) by mouth daily. 11/18/12  Yes Rosalio Macadamia, NP  tiotropium (SPIRIVA) 18 MCG inhalation capsule Place 18 mcg into inhaler and inhale daily.   Yes Historical Provider, MD  traMADol (ULTRAM) 50 MG tablet Take 50-100 mg by mouth every 6 (six) hours as needed for moderate pain.   Yes Historical Provider, MD  traZODone (DESYREL) 100 MG tablet Take 150 mg by mouth at bedtime.   Yes Historical Provider, MD  venlafaxine XR (EFFEXOR-XR) 150 MG 24 hr capsule Take 1 capsule (150 mg  total) by mouth every morning. 12/03/12  Yes [name], MD    Family History  Family History  Problem Relation Age of Onset  . Stroke Mother   . Breast cancer Sister   . Emphysema Sister   . Cancer Sister     breast  . Alcohol abuse Brother   . Colon cancer      uncles x 2  . Prostate cancer Brother     Social History  History   Social History  . Marital Status: Married    Spouse Name: N/A    Number of Children: 1  . Years of Education: 12   Occupational History  . Retired Insurance account manager for [employer]    Social History Main Topics  . Smoking status: Current Some Day Smoker -- 0.25 packs/day for 20 years    Types: Cigarettes    Last Attempt to Quit: 07/17/2012  . Smokeless tobacco: Never Used     Comment: Still smokes a little.  . Alcohol Use: No     Comment: Patient drinks caffeinated beverages daily.  . Drug Use: No  . Sexual Activity: Not Currently    Birth Control/ Protection: Surgical   Other Topics Concern  . Not on file   Social History Narrative  . No narrative on file     All other systems reviewed and are otherwise negative except as noted above.  Physical Exam  Blood pressure 151/94, pulse 88, temperature 98.3 F (36.8 C), temperature source Oral, resp. rate 16, SpO2 100.00%.  General: Pleasant, NAD. Chronically ill appearing Psych: Normal affect. Neuro: Alert and oriented X 3. Moves all extremities spontaneously. HEENT: Normal  Neck: Supple without bruits or +JVD to earlobe.  Lungs:  Resp regular and unlabored, +Bilateral crackles at bases Heart: RRR no s3, s4, or murmurs. ICD placement site looks clean with no signs of infection Abdomen: Soft, non-tender, non-distended, BS + x 4.  Extremities: No clubbing, cyanosis or 2+ pitting edema. DP/PT/Radials Pedal pulses diminished   Labs   Lab Results  Component Value Date   WBC 5.8 12/31/2012   HGB 9.3* 12/31/2012   HCT 27.3* 12/31/2012   MCV 90.1 12/31/2012   PLT 333  12/31/2012     Recent  Labs Lab 12/31/12 0652  NA 124*  K 4.9  CL 86*  CO2 23  BUN 21  CREATININE 1.24*  CALCIUM 8.7  GLUCOSE 124*      Radiology/Studies  ECHO: 11/21/2012 ------------------------------------------------------------ LV EF: 10% - 15% ------------------------------------------------------------ Study Conclusion - Procedure narrative: Transthoracic echocardiography. Image quality was adequate. The study was technically difficult due to left arm sling. - Left ventricle: The cavity size was normal. Wall thickness was normal. Systolic function was severely reduced. The estimated ejection fraction was in the range of 10% to 15%. Severe diffuse hypokinesis. - Mitral valve: Moderate regurgitation. - Left atrium: The atrium was moderately dilated. - Right ventricle: The cavity size was mildly dilated. Systolic function was moderately reduced. - Right atrium: The atrium was moderately to severely dilated. - Tricuspid valve: Moderate regurgitation. - Pulmonary arteries: Systolic pressure was moderately increased. PA peak pressure: 56mm Hg (S). Transthoracic echocardiography. M-mode, complete 2D, spectral Doppler, and color Doppler. Height: Height: 175.3cm. Height: 69in. Weight: Weight: 67.8kg. Weight: 149.2lb. Body mass index: BMI: 22.1kg/m^2. Body surface area: BSA: 1.91m^2. Blood pressure: 149/85. Patient status: Inpatient. Location: Bedside.   Dg Chest Port 1 View  12/31/2012   CLINICAL DATA:  Lower left chest and abdominal pain.  EXAM: PORTABLE CHEST - 1 VIEW  COMPARISON:  Chest radiograph December 18, 2012  FINDINGS: The cardiac silhouette appears at moderate to severely enlarged, similar. Mildly calcified aortic knob. Triple lead left cardiac defibrillator in situ. Mild chronic interstitial changes, decreased right costophrenic angle effusion. No focal consolidations. Minimal residual central pulmonary vasculature congestion. No pneumothorax.  Subacute  appearing left humeral head fracture.  IMPRESSION: Stable cardiomegaly, at decreased pulmonary edema with minimal residual central pulmonary vasculature congestion. Mild chronic interstitial changes with small residual right lung base pleural effusion .      ECG HR 86 Sinus rhythm Prolonged PR interval LBBB - electronic pacemaker No change from prior  ASSESSMENT AND PLAN  [name] is a 67 y.o. 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  Chest pain- s/p SJM BiV AICD placement 12/16/12. Will order ECHO to rule out perforation. CXR with no pneumothorax. QRS was 92 in her natural rhythm. Now she is being paced with QRS 154, which could exacerbate heart failure. Will call rep to reprogram AV delay.   Acute on Chronic CHF-- ECHO in 11/2012 revealed severe LV dysfunction (EF 10-15%) with severe diffuse hypokinesis. Moderate MR and moderate TR. PA pressure mod increased. Mod-severe RA dilation and mildly reduced RV systolic function.  -- BNP 29K (baseline 1-2K), CXR with mild chronic interstitial changes with small R lung base pleural effusion. S/S of volume overload -- Strict I/Os, daily weights  -- D/C Lasix 80 mg BID, begin Demadex 40 IV BID closely monitory renal function and electrolytes -- Continue ARB, carvedilol and spironolactone   CAD- s/p STEMI in 2002 with RCA stent x 2. Abnl nuc 04/2012 with subsequent cath showing patent stents in RCA, chronic diffuse disease in dLAD, 20% LM, EF 20%, no focal targets for PCI. -- No chest pain currently, not reminiscent of previous cardiac chest pain  -- Troponin x1, partial relief with NTG -- EKG with no acute ST or TW changes -- Continue aspirin, statin, carvedilol, imdur  Lower Extremity Edema- In the setting of  SOB and  pleurtic chest pain in a wheelchair bound patient -- Bilateral LE edema R>L-- associated with pain  -- Will order LE doppler to rule out DVT/PE -- D Dimer -- Will put on full dose lovenox per pharmacy   CKD- Cr 1.24 (near her baseline) -- Oliguric, continue to monitor  -- Continue Losartan   COPD - on home night O2, 100% on 4 L -- Continue Spiriva   DM- SSI per pharmacy   Anemia- H/H 9.3/27.3-- stable   Weakness- will need a PT/OT evaluation   Signed, Thereasa Parkin, PA-C 12/31/2012, 12:17 PM  Pager [phone] Admitted with congestive heart failure and volume overload about one week following discharge during which hospitalization she had undergone CRT implantation the postoperative course was complicated by weakness it took a number of days to resolve.  She comes in today with complaints of chest discomfort which is pleuritic; device function is normal. Her QRS duration is relatively wide compared to pre-implant; CRT had been undertaken because of first degree AV block and intermittent high-grade block and the concern of RV apical pacing. It is possible that the pacing is aggravating her heart failure.  We'll plan to admit her, undertake vigorous diuresis. With asymmetric edema, we'll undertake Dopplers scanning to exclude DVT as well as check a d-dimer. Her device will be reprogrammed to allow for intrinsic conduction and an echo to exclude perforation. PT OT evaluation would be critical.

## 2012-12-31 NOTE — ED Notes (Signed)
Patient returning from exams at this time, patient in NAD at this time

## 2012-12-31 NOTE — Progress Notes (Signed)
ANTICOAGULATION CONSULT NOTE - Initial Consult  Pharmacy Consult for Enoxaparin Indication: chest pain/ACS  Allergies  Allergen Reactions  . Amlodipine Other (See Comments)    constipation  . Ramipril Cough  . Adhesive [Tape] Hives    "peels skin off"  . Morphine And Related Other (See Comments)    Per Husband, Pt has hallucinations  . Penicillins Itching  . Codeine Rash    Patient Measurements: Height: 5' 9.5" (176.5 cm) Weight: 160 lb 0.9 oz (72.6 kg) IBW/kg (Calculated) : 67.35  Vital Signs: Temp: 97.2 F (36.2 C) (12/30 1517) Temp src: Axillary (12/30 1517) BP: 152/94 mmHg (12/30 1517) Pulse Rate: 88 (12/30 1200)  Labs:  Recent Labs  12/31/12 0652  HGB 9.3*  HCT 27.3*  PLT 333  CREATININE 1.24*    Estimated Creatinine Clearance: 46.2 ml/min (by C-G formula based on Cr of 1.24).   Medical History: Past Medical History  Diagnosis Date  . DM   . DYSLIPIDEMIA   . OBESITY NOS   . DEPRESSION, RECURRENT, IN PARTIAL REMISSION   . HYPERTENSION   . CORONARY ARTERY DISEASE     a. s/p Q wave MI 2002 with RCA stent x 2. b. Abnl nuc 04/2012 with subsequent cath showing patent stents in RCA, chronic diffuse disease in dLAD, 20% LM, EF 20%, no focal targets for PCI.  [name] GERD   . DIVERTICULOSIS, COLON, WITH HEMORRHAGE   . BACK PAIN, LUMBAR, WITH RADICULOPATHY   . OBSTRUCTIVE SLEEP APNEA   . Anemia, iron deficiency   . Hyponatremia   . Tobacco abuse   . Moderate COPD (chronic obstructive pulmonary disease) 11/07/2005    Spirometry 03/09/11>>FEV1 2.02 (88%), FEV1% 76    . Anxiety   . Arthritis   . Myocardial infarction 2001  . Dementia     a. Saw neuro 08/2012: concerning for early dementia, labs unremarkable except elev CRP.  [name] Chronic edema   . Weight loss     CT abd pelvis 05/2012: mild small bowel disention. No significant acute changes. Mod-severe gastritis by EGD 06/06/2012 no clear neoplastic changes. Neg small bowel follow through except for diverticulum on  06/11/12.   . Gastritis     a. by EGD 05/2012.  [name] Chronic systolic CHF (congestive heart failure)     a. Last EF 20% by cath 04/29/2012, 40-45% by echo 04/24/12.;  b.  Echocardiogram  (11/21/12): EF 10-15%, severe diffuse HK, moderate MR, moderate LAE, mild RVE, moderately reduced RVSF, moderate to severe RAE, moderate TR, PASP 56  . Ischemic cardiomyopathy   . Syncope     a. 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 BB discontinued. b. Recurrence in 11/2012.=> Life Vest placed pending ICD placmement   . 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 12/2012 SJM BiV ICD.    Medications:  Prescriptions prior to admission  Medication Sig Dispense Refill  . acetaminophen (TYLENOL) 500 MG tablet Take 500 mg by mouth every 6 (six) hours as needed for pain.      [name] albuterol (PROVENTIL) (2.5 MG/3ML) 0.083% nebulizer solution Take 2.5 mg by nebulization every 6 (six) hours as needed for wheezing.      [name] aspirin 81 MG tablet Take 81 mg by mouth daily.        [name] atorvastatin (LIPITOR) 40 MG tablet Take 40 mg by mouth every morning.      . carvedilol (COREG) 12.5 MG tablet Take 0.5 tablets (6.25 mg total) by mouth 2 (two) times  daily with a meal.  60 tablet  6  . diclofenac sodium (VOLTAREN) 1 % GEL Apply 2 g topically 4 (four) times daily.      . feeding supplement (GLUCERNA SHAKE) LIQD Take 237 mLs by mouth as needed.       . gabapentin (NEURONTIN) 300 MG capsule Take 1 capsule (300 mg total) by mouth 2 (two) times daily.  180 capsule  3  . hydrOXYzine (ATARAX/VISTARIL) 10 MG tablet Take 1 tablet (10 mg total) by mouth 2 (two) times daily.  180 tablet  3  . Iron-Folic Acid-B12-C-Docusate (FERRAPLUS 90) 90-1 MG TABS Take 1 tablet by mouth daily.  90 tablet  3  . isosorbide mononitrate (IMDUR) 60 MG 24 hr tablet Take 60 mg by mouth daily at 12 noon.      Demetra Shiner Devices (SIMPLE DIAGNOSTICS LANCING DEV) MISC       . losartan (COZAAR) 50 MG tablet Take 1 tablet (50  mg total) by mouth daily.  90 tablet  3  . magnesium oxide (MAG-OX) 400 MG tablet Take 1 tablet (400 mg total) by mouth daily.  30 tablet  0  . nitroGLYCERIN (NITROSTAT) 0.4 MG SL tablet Place 1 tablet (0.4 mg total) under the tongue every 5 (five) minutes x 3 doses as needed for chest pain.  25 tablet  3  . omeprazole (PRILOSEC) 40 MG capsule Take 40 mg by mouth daily.      . polyethylene glycol (MIRALAX / GLYCOLAX) packet Take 17 g by mouth daily as needed (for constipation).      . sitaGLIPtan-metformin (JANUMET) 50-500 MG per tablet Take 1 tablet by mouth 2 (two) times daily with a meal.      . SPIRIVA HANDIHALER 18 MCG inhalation capsule USE 1 CAPSULE VIA HANDIHALER EVERY DAY  30 capsule  0  . spironolactone (ALDACTONE) 25 MG tablet Take 1 tablet (25 mg total) by mouth daily.  30 tablet  6  . tiotropium (SPIRIVA) 18 MCG inhalation capsule Place 18 mcg into inhaler and inhale daily.      . traMADol (ULTRAM) 50 MG tablet Take 50-100 mg by mouth every 6 (six) hours as needed for moderate pain.      . traZODone (DESYREL) 100 MG tablet Take 150 mg by mouth at bedtime.      [name] venlafaxine XR (EFFEXOR-XR) 150 MG 24 hr capsule Take 1 capsule (150 mg total) by mouth every morning.  90 capsule  3     Admit Complaint: 67 y.o.  female  admitted 12/31/2012 with chest pain.  Pharmacy consulted to dose enoxaparin for PE, d-dimer elevated, dopplers negative,   PMH:CAD, CHF EF 10-15%, ICD placed 12/16/12, COPD, DM, HTN, anemia, dementia.  Assessment: Anticoagulation: VTE Prophylaxis; PE, follow up plan for CT, start enoxaparin 70 mg sq q12h, follow up CBC and SCr  Cardiovascular: CAD, HF Meds: ASA, Atorvastatin, coreg, torsemide, IMDUR, losartan, spironolactone Current Weight: 160 lb 0.9 oz (72.6 kg)  Endocrinology: DM  Neurology/MSK: Dementia Meds: Trazodone, effexor, gabapentin CPOT:  RASS:  CAM ICU:    Pulmonary: COPD  PTA Medication Issues: Home Meds Not Ordered: diclofenac gel, jaumet,  spriva  Best Practices: VTE Prophylaxis:  enoxaparin  Goal of Therapy:  Anti-Xa level 0.6-1 units/ml 4hrs after LMWH dose given Monitor platelets by anticoagulation protocol: Yes   Plan:  Lovenox 70 mg SQ q12h Follow CBC Q72h, and SCr periodically, adjust as clinically indicated.   Thank you for allowing pharmacy to be a part of this  patients care team.  Lovenia Kim Pharm.D., BCPS Clinical Pharmacist 12/31/2012 3:45 PM Pager: [phone] Phone: [phone]

## 2012-12-31 NOTE — ED Provider Notes (Signed)
Medical screening examination/treatment/procedure(s) were conducted as a shared visit with non-physician practitioner(s) and myself.  I personally evaluated the patient during the encounter.  EKG Interpretation    Date/Time:  Tuesday December 31 2012 06:13:38 EST Ventricular Rate:  86 PR Interval:  212 QRS Duration: 145 QT Interval:  436 QTC Calculation: 521 R Axis:   -46 Text Interpretation:  Age not entered, assumed to be  67 years old for purpose of ECG interpretation Sinus rhythm Prolonged PR interval LBBB - electronic pacemaker No change from prior Confirmed by [facility]  MD, Janyce Ellinger ([id number]) on 12/31/2012 10:05:16 AM             Patient here with chest pain. She is an extremely terrible historian. Constant chest pain without radiation. She does have an EF around 10-15%. She also has had some worsening dyspnea on exertion. Here her vitals are stable. Her lungs are clear. Her vitals show elevated BNP. Patient admitted to cardiology.  Dagmar Hait, MD 12/31/12 [phone]

## 2013-01-01 LAB — GLUCOSE, CAPILLARY
Glucose-Capillary: 131 mg/dL — ABNORMAL HIGH (ref 70–99)
Glucose-Capillary: 176 mg/dL — ABNORMAL HIGH (ref 70–99)
Glucose-Capillary: 99 mg/dL (ref 70–99)
Glucose-Capillary: 109 mg/dL — ABNORMAL HIGH (ref 70–99)

## 2013-01-01 LAB — BASIC METABOLIC PANEL
CO2: 24 mEq/L (ref 19–32)
Calcium: 8.7 mg/dL (ref 8.4–10.5)
Creatinine, Ser: 1.27 mg/dL — ABNORMAL HIGH (ref 0.50–1.10)
GFR calc Af Amer: 49 mL/min — ABNORMAL LOW (ref >90)
Potassium: 5.1 mEq/L (ref 3.7–5.3)
Sodium: 127 mEq/L — ABNORMAL LOW (ref 137–147)

## 2013-01-01 MED ORDER — ENOXAPARIN SODIUM 40 MG/0.4ML ~~LOC~~ SOLN
40.0000 mg | SUBCUTANEOUS | Status: DC
  Administered 2013-01-02 – 2013-01-07 (×6): 40 mg via SUBCUTANEOUS
  Filled 2013-01-01 (×6): qty 0.4

## 2013-01-01 NOTE — Clinical Documentation Improvement (Signed)
THIS DOCUMENT IS NOT A PERMANENT PART OF THE MEDICAL RECORD  Please update your documentation within the medical record to reflect your response to this query. If you need help knowing how to do this please call [phone].  01/01/13  Dear [name],  In a better effort to capture your patient's severity of illness, reflect appropriate length of stay and utilization of resources, a review of the medical record has revealed the following indicators.   Based on your clinical judgment, please clarify and document in a progress note and/or discharge summary the clinical condition associated with the following supporting information: In responding to this query please exercise your independent judgment.  The fact that a query is asked, does not imply that any particular answer is desired or expected.   Hello [name]!  "Hyponatremia" is documented as a historical diagnosis for [name]. Please determine if this diagnosis is included in this hospital admission problems since the serum sodium levels are abnormal. Abnormal findings (laboratory, x-ray, pathologic, and other diagnostic results) are not coded and reported unless the physician indicates their clinical significance. The medical record reflects the following clinical findings, please clarify the diagnostic and/or clinical significance:      Component      Sodium  Latest Ref Rng      137 - 147 mEq/L  12/31/2012      124 (L)  01/01/2013      127 (L)    Possible Clinical Conditions?  - Hyponatremia  - Chronic Hyponatremia  - Other condition (please document in the progress notes and/or discharge summary)    Reviewed: additional documentation in the medical record   Thank You,  Saul Fordyce  Clinical Documentation Specialist: [id number] Health Information Management [place]

## 2013-01-01 NOTE — Progress Notes (Signed)
Clinical Social Work Department CLINICAL SOCIAL WORK PLACEMENT NOTE 01/01/2013  Patient:  [name], [name]  Account Number:  [id number] Admit date:  12/31/2012  Clinical Social Worker:  Carren Rang  Date/time:  01/01/2013 11:29 AM  Clinical Social Work is seeking post-discharge placement for this patient at the following level of care:   SKILLED NURSING   (*CSW will update this form in Epic as items are completed)   01/01/2013  Patient/family provided with [facility] Department of Clinical Social Work's list of facilities offering this level of care within the geographic area requested by the patient (or if unable, by the patient's family).  01/01/2013  Patient/family informed of their freedom to choose among providers that offer the needed level of care, that participate in Medicare, Medicaid or managed care program needed by the patient, have an available bed and are willing to accept the patient.  01/01/2013  Patient/family informed of [facility]' ownership interest in [facility], as well as of the fact that they are under no obligation to receive care at this facility.  PASARR submitted to EDS on 01/01/2013 PASARR number received from EDS on 01/01/2013  FL2 transmitted to all facilities in geographic area requested by pt/family on  01/01/2013 FL2 transmitted to all facilities within larger geographic area on   Patient informed that his/her managed care company has contracts with or will negotiate with  certain facilities, including the following:     Patient/family informed of bed offers received:   Patient chooses bed at  Physician recommends and patient chooses bed at    Patient to be transferred to  on   Patient to be transferred to facility by   The following physician request were entered in Epic:   Additional Comments:  Maree Krabbe, MSW, [employer] [phone]

## 2013-01-01 NOTE — Evaluation (Addendum)
Occupational Therapy Evaluation Patient Details Name: [name] MRN: [id number] DOB: Sep 19, 1945 Today's Date: 01/01/2013 Time: 4098-1191 OT Time Calculation (min): 43 min  OT Assessment / Plan / Recommendation History of present illness [name] is a 67 y.o. female with a history of CAD, chronic systolic CHF (EF 47-82%), syncope in the setting of 2:1 heart block s/p ICD placement, COPD- on home night O2, T2DM, iron deficiency anemia, HTN, and dementia who presented to the ED today complaining of chest pain and SOB.   Clinical Impression   Pt is currently min assist level for selfcare tasks, standing balance, and functional transfers during ADLs.  She needs constant min assist secondary to increased posterior LOB.  Feel she will benefit from acute care OT to help increase independence with follow-up SNF for extended rehab prior to returning home with her spouse.  MD please advise as pt suffered a left humeral fracture back in November and is currently not wearing a sling.  Unable to find information regarding weightbearing or ROM restrictions in the orders or chart.    OT Assessment  Patient needs continued OT Services    Follow Up Recommendations  SNF;Supervision/Assistance - 24 hour       Equipment Recommendations  None recommended by OT       Frequency  Min 2X/week    Precautions / Restrictions Precautions Precautions: Fall;ICD/Pacemaker Precaution Comments: LUE fx   Pertinent Vitals/Pain Pt with no report of pain, O2 sats 97-92% on 2Ls during session    ADL  Eating/Feeding: Simulated;Independent Where Assessed - Eating/Feeding: Chair Grooming: Performed;Wash/dry face;Wash/dry hands;Teeth care;Brushing hair;Minimal assistance Where Assessed - Grooming: Supported standing Upper Body Bathing: Performed;Right arm;Left arm;Abdomen;Chest;Set up Where Assessed - Upper Body Bathing: Unsupported sitting Lower Body Bathing: Performed;Minimal assistance Where  Assessed - Lower Body Bathing: Supported sit to stand Lower Body Dressing: Performed;Minimal assistance Where Assessed - Lower Body Dressing: Supported sit to stand Toilet Transfer: Mining engineer Method: Surveyor, minerals: Comfort height toilet;Grab bars Toileting - Architect and Hygiene: Performed;Minimal assistance Where Assessed - Engineer, mining and Hygiene: Sit to stand from 3-in-1 or toilet Transfers/Ambulation Related to ADLs: Pt performs transfers with min assist stand pivot hand held assist. ADL Comments: Pt with increased posterior lean in standing when performing LB bathing.  Also noted with standing at the sink while she was brushing her teeth.  She needed constant min assist to maintain standing balance.    OT Diagnosis: Generalized weakness  OT Problem List: Decreased strength;Decreased range of motion;Impaired balance (sitting and/or standing);Decreased activity tolerance;Decreased knowledge of use of DME or AE;Cardiopulmonary status limiting activity OT Treatment Interventions: Self-care/ADL training;Therapeutic exercise;Patient/family education;Therapeutic activities;Balance training   OT Goals(Current goals can be found in the care plan section) Acute Rehab OT Goals Patient Stated Goal: She requested to get washed up. OT Goal Formulation: With patient Time For Goal Achievement: 01/15/13 Potential to Achieve Goals: Good  Visit Information  Last OT Received On: 01/01/13 Assistance Needed: +1 History of Present Illness: [name] is a 67 y.o. female with a history of CAD, chronic systolic CHF (EF 95-62%), syncope in the setting of 2:1 heart block s/p ICD placement, COPD- on home night O2, T2DM, iron deficiency anemia, HTN, and dementia who presented to the ED today complaining of chest pain and SOB.       Prior Functioning     Home Living Family/patient expects to be discharged to::  Skilled nursing facility Living Arrangements: Spouse/significant other Available  Help at Discharge: Family Type of Home: House Home Access: Stairs to enter Home Layout: One level Home Equipment: Environmental consultant - 4 wheels Prior Function Level of Independence: Needs assistance Gait / Transfers Assistance Needed: husband reports pt walks rarely lately ADL's / Homemaking Assistance Needed: spouse has been bathing and dressing her as well as all housework Communication Communication: No difficulties Dominant Hand: Right         Vision/Perception Vision - History Baseline Vision: Wears glasses all the time Patient Visual Report: No change from baseline Vision - Assessment Eye Alignment: Within Functional Limits Vision Assessment: Vision not tested Perception Perception: Within Functional Limits Praxis Praxis: Intact   Cognition  Cognition Arousal/Alertness: Awake/alert Behavior During Therapy: WFL for tasks assessed/performed Overall Cognitive Status: Within Functional Limits for tasks assessed Following Commands: Follows one step commands consistently    Extremity/Trunk Assessment Upper Extremity Assessment Upper Extremity Assessment: LUE deficits/detail LUE Deficits / Details: Pt with active shoulder AROM 0-70 degrees, history of left shoulder fracture recently per her report. Elbow flexion/extension WFLs as well as grip.  Elbow and grip strenghth 3+/5. Lower Extremity Assessment Lower Extremity Assessment: Defer to PT evaluation Cervical / Trunk Assessment Cervical / Trunk Assessment: Normal     Mobility Bed Mobility Bed Mobility: Supine to Sit Supine to Sit: With rails;5: Supervision Transfers Transfers: Sit to Stand Sit to Stand: 4: Min assist;From bed;With upper extremity assist Stand to Sit: 3: Mod assist;To chair/3-in-1;Without upper extremity assist Details for Transfer Assistance: Pt with abrupt sitting in the bedside chair did not control descent.        Balance  Balance Balance Assessed: Yes Static Sitting Balance Static Sitting - Balance Support: Right upper extremity supported;Left upper extremity supported Static Sitting - Level of Assistance: 7: Independent Static Standing Balance Static Standing - Balance Support: Right upper extremity supported;Left upper extremity supported Static Standing - Level of Assistance: 4: Min assist Dynamic Standing Balance Dynamic Standing - Balance Support: Right upper extremity supported Dynamic Standing - Level of Assistance: 4: Min assist   End of Session OT - End of Session Activity Tolerance: Patient limited by fatigue Patient left: in chair;with call bell/phone within reach;with family/visitor present     Aviv Lengacher OTR/L 01/01/2013, 2:52 PM

## 2013-01-01 NOTE — Progress Notes (Signed)
SUBJECTIVE:  She is much more alert than the last time I saw her.  She is less SOB   PHYSICAL EXAM Filed Vitals:   12/31/12 2057 01/01/13 0042 01/01/13 0500 01/01/13 0853  BP: 131/91 120/70 119/84 143/84  Pulse: 88 80 88 85  Temp: 97.3 F (36.3 C)  97.6 F (36.4 C)   TempSrc: Oral  Oral   Resp: 18  18   Height:      Weight:   160 lb 15 oz (73 kg)   SpO2: 100%  99%    General:  No acute distress Lungs:  Decreased breath sounds Heart:  RRR Abdomen:  Positive bowel sounds, no rebound no guarding Extremities:  Moderate edema.  LABS: Lab Results  Component Value Date   TROPONINI <0.30 11/30/2012   Results for orders placed during the hospital encounter of 12/31/12 (from the past 24 hour(s))  D-DIMER, QUANTITATIVE     Status: Abnormal   Collection Time    12/31/12 12:15 PM      Result Value Range   D-Dimer, Quant 6.78 (*) 0.00 - 0.48 ug/mL-FEU  PROTIME-INR     Status: None   Collection Time    12/31/12 12:15 PM      Result Value Range   Prothrombin Time 14.5  11.6 - 15.2 seconds   INR 1.15  0.00 - 1.49  URINALYSIS, ROUTINE W REFLEX MICROSCOPIC     Status: None   Collection Time    12/31/12  2:21 PM      Result Value Range   Color, Urine YELLOW  YELLOW   APPearance CLEAR  CLEAR   Specific Gravity, Urine 1.009  1.005 - 1.030   pH 5.0  5.0 - 8.0   Glucose, UA NEGATIVE  NEGATIVE mg/dL   Hgb urine dipstick NEGATIVE  NEGATIVE   Bilirubin Urine NEGATIVE  NEGATIVE   Ketones, ur NEGATIVE  NEGATIVE mg/dL   Protein, ur NEGATIVE  NEGATIVE mg/dL   Urobilinogen, UA 0.2  0.0 - 1.0 mg/dL   Nitrite NEGATIVE  NEGATIVE   Leukocytes, UA NEGATIVE  NEGATIVE  GLUCOSE, CAPILLARY     Status: Abnormal   Collection Time    12/31/12  4:22 PM      Result Value Range   Glucose-Capillary 174 (*) 70 - 99 mg/dL  TSH     Status: Abnormal   Collection Time    12/31/12  4:50 PM      Result Value Range   TSH 6.259 (*) 0.350 - 4.500 uIU/mL  MAGNESIUM     Status: None   Collection Time   12/31/12  4:50 PM      Result Value Range   Magnesium 1.7  1.5 - 2.5 mg/dL  HEMOGLOBIN Z6X     Status: Abnormal   Collection Time    12/31/12  4:50 PM      Result Value Range   Hemoglobin A1C 6.5 (*) <5.7 %   Mean Plasma Glucose 140 (*) <117 mg/dL  GLUCOSE, CAPILLARY     Status: Abnormal   Collection Time    12/31/12  9:32 PM      Result Value Range   Glucose-Capillary 149 (*) 70 - 99 mg/dL  BASIC METABOLIC PANEL     Status: Abnormal   Collection Time    01/01/13  5:35 AM      Result Value Range   Sodium 127 (*) 137 - 147 mEq/L   Potassium 5.1  3.7 - 5.3 mEq/L   Chloride 89 (*)  96 - 112 mEq/L   CO2 24  19 - 32 mEq/L   Glucose, Bld 128 (*) 70 - 99 mg/dL   BUN 22  6 - 23 mg/dL   Creatinine, Ser 1.61 (*) 0.50 - 1.10 mg/dL   Calcium 8.7  8.4 - 09.6 mg/dL   GFR calc non Af Amer 42 (*) >90 mL/min   GFR calc Af Amer 49 (*) >90 mL/min  GLUCOSE, CAPILLARY     Status: Abnormal   Collection Time    01/01/13  7:08 AM      Result Value Range   Glucose-Capillary 131 (*) 70 - 99 mg/dL    Intake/Output Summary (Last 24 hours) at 01/01/13 0951 Last data filed at 01/01/13 0454  Gross per 24 hour  Intake    360 ml  Output   1651 ml  Net  -1291 ml    ASSESSMENT AND PLAN:  ACUTE ON CHRONIC SYSTOLIC CHF:   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  Continue IV diuresis.  EF 20% by echo this admit with moderate MR.    Continue IV Demadex.    CAD:  Continue medical management.    CKD:  Creat stable.  WEAKNESS:   OT and PT have been consulted.      Fayrene Fearing [facility] 01/01/2013 9:51 AM

## 2013-01-01 NOTE — Progress Notes (Signed)
Clinical Social Work Department BRIEF PSYCHOSOCIAL ASSESSMENT 01/01/2013  Patient:  LAURELAI, LEPP     Account Number:  [id number]     Admit date:  12/31/2012  Clinical Social Worker:  Carren Rang  Date/Time:  01/01/2013 11:07 AM  Referred by:  RN  Date Referred:  01/01/2013 Referred for  SNF Placement   Other Referral:   Interview type:  Patient Other interview type:    PSYCHOSOCIAL DATA Living Status:  HUSBAND Admitted from facility:   Level of care:   Primary support name:  Kurtis Bushman Primary support relationship to patient:  SPOUSE Degree of support available:   good    CURRENT CONCERNS Current Concerns  Post-Acute Placement   Other Concerns:    SOCIAL WORK ASSESSMENT / PLAN Clinical Social Worker received referral for SNF placement at d/c. CSW introduced self and explained reason for visit. CSW explained SNF process to patient. Patient reported she is agreeable for SNF placement.  CSW will complete FL2 for MD's signature and will update patient when bed offers are received.   Assessment/plan status:  Psychosocial Support/Ongoing Assessment of Needs Other assessment/ plan:   Information/referral to community resources:   SNF information    PATIENT'S/FAMILY'S RESPONSE TO PLAN OF CARE: Patient is agreeable to SNF.       Maree Krabbe, MSW, [facility] [phone]

## 2013-01-02 LAB — BASIC METABOLIC PANEL
BUN: 24 mg/dL — ABNORMAL HIGH (ref 6–23)
CO2: 26 mEq/L (ref 19–32)
Calcium: 8.7 mg/dL (ref 8.4–10.5)
Chloride: 89 mEq/L — ABNORMAL LOW (ref 96–112)
Creatinine, Ser: 1.47 mg/dL — ABNORMAL HIGH (ref 0.50–1.10)
GFR calc Af Amer: 41 mL/min — ABNORMAL LOW (ref >90)
GFR calc non Af Amer: 35 mL/min — ABNORMAL LOW (ref >90)
Glucose, Bld: 89 mg/dL (ref 70–99)
Potassium: 5.1 mEq/L (ref 3.7–5.3)
Sodium: 128 mEq/L — ABNORMAL LOW (ref 137–147)

## 2013-01-02 LAB — GLUCOSE, CAPILLARY
GLUCOSE-CAPILLARY: 105 mg/dL — AB (ref 70–99)
Glucose-Capillary: 82 mg/dL (ref 70–99)
Glucose-Capillary: 155 mg/dL — ABNORMAL HIGH (ref 70–99)
Glucose-Capillary: 181 mg/dL — ABNORMAL HIGH (ref 70–99)

## 2013-01-02 MED ORDER — TORSEMIDE 20 MG PO TABS
40.0000 mg | ORAL_TABLET | Freq: Every day | ORAL | Status: DC
  Administered 2013-01-02 – 2013-01-04 (×3): 40 mg via ORAL
  Filled 2013-01-02 (×3): qty 2

## 2013-01-02 NOTE — Progress Notes (Signed)
I&O cath for 575 dark yellow urine.

## 2013-01-02 NOTE — Progress Notes (Signed)
Physical Therapy Treatment Patient Details Name: [name] MRN: [id number] DOB: 07/21/1945 Today's Date: 01/02/2013 Time: [id number] PT Time Calculation (min): 16 min  PT Assessment / Plan / Recommendation  History of Present Illness [name] BrothersHattie B Lyn [name] is a 68 y.o. female with a history of CAD, chronic systolic CHF (EF 47-82%10-15%), syncope in the setting of 2:1 heart block s/p ICD placement, COPD- on home night O2, T2DM, iron deficiency anemia, HTN, and dementia who presented to the ED today complaining of chest pain and SOB.   PT Comments   Patient self limiting with session today. Did not want to sit up in recliner at end of session. Continue to recommend SNF for ongoing Physical Therapy.     Follow Up Recommendations  SNF     Does the patient have the potential to tolerate intense rehabilitation     Barriers to Discharge        Equipment Recommendations  None recommended by PT    Recommendations for Other Services    Frequency Min 3X/week   Progress towards PT Goals Progress towards PT goals: Progressing toward goals (slowly)  Plan Current plan remains appropriate    Precautions / Restrictions Precautions Precautions: Fall;ICD/Pacemaker   Pertinent Vitals/Pain COmplained of some leg pain. patient repositioned for comfort    Mobility  Bed Mobility Supine to Sit: With rails;5: Supervision Sit to Supine: 5: Supervision Transfers Sit to Stand: 4: Min assist;From bed;With upper extremity assist Stand to Sit: 4: Min assist;With upper extremity assist;To bed Details for Transfer Assistance: Patient stood x3 for strengthening. A for initiation of stand and to ensure balance. Patient with posterior lean Ambulation/Gait Ambulation/Gait Assistance: 4: Min assist Ambulation Distance (Feet): 10 Feet Assistive device: 1 person hand held assist Ambulation/Gait Assistance Details: Patient quickly ended ambulation due to fatigue and patient stating "I cant do this" Gait Pattern:  Step-through pattern;Decreased step length - right;Decreased step length - left;Decreased stride length;Decreased hip/knee flexion - right;Decreased hip/knee flexion - left;Shuffle    Exercises     PT Diagnosis:    PT Problem List:   PT Treatment Interventions:     PT Goals (current goals can now be found in the care plan section)    Visit Information  Last PT Received On: 01/02/13 Assistance Needed: +1 History of Present Illness: [name] is a 68 y.o. female with a history of CAD, chronic systolic CHF (EF 95-62%10-15%), syncope in the setting of 2:1 heart block s/p ICD placement, COPD- on home night O2, T2DM, iron deficiency anemia, HTN, and dementia who presented to the ED today complaining of chest pain and SOB.    Subjective Data      Cognition  Cognition Arousal/Alertness: Awake/alert Behavior During Therapy: WFL for tasks assessed/performed Overall Cognitive Status: Within Functional Limits for tasks assessed    Balance     End of Session PT - End of Session Equipment Utilized During Treatment: Gait belt;Oxygen Activity Tolerance: Patient limited by fatigue Patient left: in bed;with call bell/phone within reach   GP     Robinette, Adline PotterJulia Elizabeth 01/02/2013, 1:55 PM 01/02/2013 Fredrich Birksobinette, Julia Elizabeth PTA [phone] pager [phone] office

## 2013-01-02 NOTE — Progress Notes (Addendum)
    SUBJECTIVE:  She is breathing OK but she is having trouble urinating without a catheter.    PHYSICAL EXAM Filed Vitals:   01/01/13 1513 01/01/13 2140 01/02/13 0500 01/02/13 0541  BP: 113/74 121/80  134/92  Pulse: 80 87  86  Temp: 97.6 F (36.4 C) 97 F (36.1 C)  97.7 F (36.5 C)  TempSrc: Axillary Oral  Oral  Resp: 18 18  20   Height:      Weight:   161 lb 4.8 oz (73.165 kg)   SpO2: 98% 100%  99%   General:  No acute distress Lungs:  Good breath sounds Heart:  RRR Abdomen:  Positive bowel sounds, no rebound no guarding Extremities:  Trace edema.  LABS:  Results for orders placed during the hospital encounter of 12/31/12 (from the past 24 hour(s))  GLUCOSE, CAPILLARY     Status: Abnormal   Collection Time    01/01/13 11:33 AM      Result Value Range   Glucose-Capillary 176 (*) 70 - 99 mg/dL  GLUCOSE, CAPILLARY     Status: None   Collection Time    01/01/13  4:33 PM      Result Value Range   Glucose-Capillary 99  70 - 99 mg/dL   Comment 1 Documented in Chart     Comment 2 Notify RN    GLUCOSE, CAPILLARY     Status: Abnormal   Collection Time    01/01/13  9:38 PM      Result Value Range   Glucose-Capillary 109 (*) 70 - 99 mg/dL   Comment 1 Documented in Chart     Comment 2 Notify RN    GLUCOSE, CAPILLARY     Status: None   Collection Time    01/02/13  5:54 AM      Result Value Range   Glucose-Capillary 82  70 - 99 mg/dL    Intake/Output Summary (Last 24 hours) at 01/02/13 0748 Last data filed at 01/02/13 0554  Gross per 24 hour  Intake    720 ml  Output    825 ml  Net   -105 ml    ASSESSMENT AND PLAN:  ACUTE ON CHRONIC SYSTOLIC CHF:   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  Continue IV diuresis.  EF 20% by echo this admit with moderate MR.    I will reduce Demadex to daily.   She had the Foley out at 5 AM this morning.  If she cannot urinate by noon she will get a bladder scan.  And possible repeat Foley.  If so I will ask urology to see her.  She reports that  she was not able to urinate at home and this is why she ended up with volume overload.   CAD:  Continue medical management.    CKD:  Creat stable.  WEAKNESS:   OT and PT have been consulted. She will go to SNF at discharge.    Hyponatremia:  She remains slightly hyponatremic but this is stable.  Continue current therapy.   Fayrene FearingJames Laser And Surgery [facility] 01/02/2013 7:48 AM

## 2013-01-03 ENCOUNTER — Inpatient Hospital Stay (HOSPITAL_COMMUNITY): Payer: Medicare Other

## 2013-01-03 ENCOUNTER — Ambulatory Visit: Admitting: Physician Assistant

## 2013-01-03 LAB — BASIC METABOLIC PANEL
BUN: 24 mg/dL — AB (ref 6–23)
CHLORIDE: 88 meq/L — AB (ref 96–112)
CO2: 23 mEq/L (ref 19–32)
Calcium: 8.4 mg/dL (ref 8.4–10.5)
Creatinine, Ser: 1.43 mg/dL — ABNORMAL HIGH (ref 0.50–1.10)
GFR calc Af Amer: 43 mL/min — ABNORMAL LOW (ref >90)
GFR calc non Af Amer: 37 mL/min — ABNORMAL LOW (ref >90)
GLUCOSE: 125 mg/dL — AB (ref 70–99)
Potassium: 4.9 mEq/L (ref 3.7–5.3)
Sodium: 125 mEq/L — ABNORMAL LOW (ref 137–147)

## 2013-01-03 LAB — GLUCOSE, CAPILLARY
GLUCOSE-CAPILLARY: 248 mg/dL — AB (ref 70–99)
Glucose-Capillary: 130 mg/dL — ABNORMAL HIGH (ref 70–99)
Glucose-Capillary: 229 mg/dL — ABNORMAL HIGH (ref 70–99)
Glucose-Capillary: 165 mg/dL — ABNORMAL HIGH (ref 70–99)

## 2013-01-03 NOTE — Progress Notes (Addendum)
SUBJECTIVE:  She is complaining of pain in her right pretibial area and right calf   PHYSICAL EXAM Filed Vitals:   01/02/13 0903 01/02/13 1500 01/02/13 2103 01/03/13 0508  BP:  143/89 126/80 133/68  Pulse: 100 80 87 89  Temp:  97.7 F (36.5 C) 98.2 F (36.8 C) 99 F (37.2 C)  TempSrc:  Oral Oral Oral  Resp: 16 20 18 18   Height:      Weight:    159 lb 13.3 oz (72.5 kg)  SpO2:  94% 100% 98%   General:  No acute distress Lungs:  Good breath sounds Heart:  RRR Abdomen:  Positive bowel sounds, no rebound no guarding Extremities:  Trace edema.  No trauma to right lower leg.  No cord.    LABS:  Results for orders placed during the hospital encounter of 12/31/12 (from the past 24 hour(s))  GLUCOSE, CAPILLARY     Status: Abnormal   Collection Time    01/02/13 11:33 AM      Result Value Range   Glucose-Capillary 105 (*) 70 - 99 mg/dL   Comment 1 Notify RN     Comment 2 Documented in Chart    GLUCOSE, CAPILLARY     Status: Abnormal   Collection Time    01/02/13  4:25 PM      Result Value Range   Glucose-Capillary 155 (*) 70 - 99 mg/dL   Comment 1 Notify RN     Comment 2 Documented in Chart    GLUCOSE, CAPILLARY     Status: Abnormal   Collection Time    01/02/13  8:57 PM      Result Value Range   Glucose-Capillary 181 (*) 70 - 99 mg/dL  BASIC METABOLIC PANEL     Status: Abnormal   Collection Time    01/03/13  5:20 AM      Result Value Range   Sodium 125 (*) 137 - 147 mEq/L   Potassium 4.9  3.7 - 5.3 mEq/L   Chloride 88 (*) 96 - 112 mEq/L   CO2 23  19 - 32 mEq/L   Glucose, Bld 125 (*) 70 - 99 mg/dL   BUN 24 (*) 6 - 23 mg/dL   Creatinine, Ser 1.611.43 (*) 0.50 - 1.10 mg/dL   Calcium 8.4  8.4 - 09.610.5 mg/dL   GFR calc non Af Amer 37 (*) >90 mL/min   GFR calc Af Amer 43 (*) >90 mL/min  GLUCOSE, CAPILLARY     Status: Abnormal   Collection Time    01/03/13  6:29 AM      Result Value Range   Glucose-Capillary 130 (*) 70 - 99 mg/dL    Intake/Output Summary (Last 24  hours) at 01/03/13 0850 Last data filed at 01/03/13 0511  Gross per 24 hour  Intake    480 ml  Output    925 ml  Net   -445 ml    ASSESSMENT AND PLAN:  ACUTE ON CHRONIC SYSTOLIC CHF:   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  Continue IV diuresis.  EF 20% by echo this admit with moderate MR.    I reduced the Demadex.  She has been unable to urinate without the Foley.  I will ask urology to see her and comment.   She has had bladder scans and has required catheterization after failing voiding trials.    CAD:  Continue medical management.    CKD:  Creat stable.  WEAKNESS:   OT and PT have been  consulted. She will go to SNF at discharge.    Hyponatremia:  She remains hyponatremic.  This is likely related to CHF and meds.  I will continue to follow.    Leg pain:  She had a negative lower extremity Doppler previously and has been on DVT prophylaxis.  I will order warm compresses.  The etiology is not clear.  If it is still painful in the AM, repeat Dopplers.  [name] 01/03/2013 8:50 AM

## 2013-01-03 NOTE — Progress Notes (Signed)
CSW provided bed offers to patient at bedside. Patient stated she has been to [facility] before and would like to go back there. CSW asked patient if social worker should update family, patient stated she would update family. CS[facility] for when patient is medically ready. FL2 on chart for MD signature.   Maree KrabbeLindsay Calil Amor, MSW, Theresia MajorsLCSWA [phone]

## 2013-01-03 NOTE — Consult Note (Addendum)
Consult: incomplete bladder emptying, CRF Requested by: [name]  History of Present Illness:  [name] is a 68 y.o. 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  Typically she voids with a slow stream. She has no frequency or urgency. No incontinence. No dysuria or gross hematuria.   She was cathed yesterday afternoon and her bladder contained 575 ml. Since then she's only made about 350 ml of urine although there is some urine in the bag.   She is s/p BiV AICD placement 12/16/12 by Dr. 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 She had post procedure complications including aspiration/hypoxia as well as urinary retention and was admitted to [facility] from 12/15-12/23.  Her Cr has slowly increased over last 6 months: Apr - June 2014 Cr 0.8 - 0.9 --> May 2014 CT A/P revealed a distended bladder, no hydro.  Jul 2014 Cr 1.0 Dec 19, 2012 Cr 1.96  Dec 31, 2012 Cr 1.24 Jan 03, 2013 Cr 1.43    Past Medical History  Diagnosis Date  . DYSLIPIDEMIA   . OBESITY NOS   . DEPRESSION, RECURRENT, IN PARTIAL REMISSION   . HYPERTENSION   . CORONARY ARTERY DISEASE     a. s/p Q wave MI 2002 with RCA stent x 2. b. Abnl nuc 04/2012 with subsequent cath showing patent stents in RCA, chronic diffuse disease in dLAD, 20% LM, EF 20%, no focal targets for PCI.  [name] GERD   . DIVERTICULOSIS, COLON, WITH HEMORRHAGE   . BACK PAIN, LUMBAR, WITH RADICULOPATHY   . OBSTRUCTIVE SLEEP APNEA   . Anemia, iron deficiency   . Hyponatremia   . Tobacco abuse   . Moderate COPD (chronic obstructive pulmonary disease) 11/07/2005    Spirometry 03/09/11>>FEV1 2.02 (88%), FEV1% 76    . Anxiety   . Dementia     a. Saw neuro 08/2012: concerning for early dementia, labs unremarkable  except elev CRP.  [name] Chronic edema   . Weight loss     CT abd pelvis 05/2012: mild small bowel disention. No significant acute changes. Mod-severe gastritis by EGD 06/06/2012 no clear neoplastic changes. Neg small bowel follow through except for diverticulum on 06/11/12.   . Gastritis     a. by EGD 05/2012.  [name] Chronic systolic CHF (congestive heart failure)     a. Last EF 20% by cath 04/29/2012, 40-45% by echo 04/24/12.;  b.  Echocardiogram  (11/21/12): EF 10-15%, severe diffuse HK, moderate MR, moderate LAE, mild RVE, moderately reduced RVSF, moderate to severe RAE, moderate TR, PASP 56  . Ischemic cardiomyopathy   . Syncope     a. 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 BB discontinued. b. Recurrence in 11/2012.=> Life Vest placed pending ICD placmement   . 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 12/2012 SJM BiV ICD.  [name] Myocardial infarction 2002  . COPD (chronic obstructive pulmonary disease)   . Pneumonia ?2012  . Shortness of breath     "bad; most of the time" (12/31/2012)  . Type II diabetes mellitus   . Arthritis     "all over" (12/31/2012)  . Mid back pain, chronic    Past Surgical History  Procedure Laterality Date  . Uvuloplasty    .  Total abdominal hysterectomy w/ bilateral salpingoophorectomy    . Cholecystectomy  2004  . Esophagogastroduodenoscopy (egd) with propofol N/A 06/06/2012    Procedure: ESOPHAGOGASTRODUODENOSCOPY (EGD) WITH PROPOFOL;  Surgeon: Rachael Fee, MD;  Location: [facility] ENDOSCOPY;  Service: Endoscopy;  Laterality: N/A;  . Cardiac defibrillator placement  12/16/2012    dual chamber  DR Graciela Husbands  . Tonsillectomy    . Appendectomy    . Total abdominal hysterectomy    . Carpal tunnel release Left   . Refractive surgery Bilateral   . Coronary angioplasty with stent placement  2002    Thrombectomy; prox RCA 3.5 x 15 mm NIR stent; mid RCA PTCA; distal RCA 3.5 x 15 mm NIR stent;       Home Medications:  Prescriptions  prior to admission  Medication Sig Dispense Refill  . acetaminophen (TYLENOL) 500 MG tablet Take 500 mg by mouth every 6 (six) hours as needed for pain.      [name] albuterol (PROVENTIL) (2.5 MG/3ML) 0.083% nebulizer solution Take 2.5 mg by nebulization every 6 (six) hours as needed for wheezing.      [name] aspirin 81 MG tablet Take 81 mg by mouth daily.        [name] atorvastatin (LIPITOR) 40 MG tablet Take 40 mg by mouth every morning.      . carvedilol (COREG) 12.5 MG tablet Take 0.5 tablets (6.25 mg total) by mouth 2 (two) times daily with a meal.  60 tablet  6  . diclofenac sodium (VOLTAREN) 1 % GEL Apply 2 g topically 4 (four) times daily.      . feeding supplement (GLUCERNA SHAKE) LIQD Take 237 mLs by mouth as needed.       . gabapentin (NEURONTIN) 300 MG capsule Take 1 capsule (300 mg total) by mouth 2 (two) times daily.  180 capsule  3  . hydrOXYzine (ATARAX/VISTARIL) 10 MG tablet Take 1 tablet (10 mg total) by mouth 2 (two) times daily.  180 tablet  3  . Iron-Folic Acid-B12-C-Docusate (FERRAPLUS 90) 90-1 MG TABS Take 1 tablet by mouth daily.  90 tablet  3  . isosorbide mononitrate (IMDUR) 60 MG 24 hr tablet Take 60 mg by mouth daily at 12 noon.      [employer] (SIMPLE DIAGNOSTICS LANCING DEV) MISC       . losartan (COZAAR) 50 MG tablet Take 1 tablet (50 mg total) by mouth daily.  90 tablet  3  . magnesium oxide (MAG-OX) 400 MG tablet Take 1 tablet (400 mg total) by mouth daily.  30 tablet  0  . nitroGLYCERIN (NITROSTAT) 0.4 MG SL tablet Place 1 tablet (0.4 mg total) under the tongue every 5 (five) minutes x 3 doses as needed for chest pain.  25 tablet  3  . omeprazole (PRILOSEC) 40 MG capsule Take 40 mg by mouth daily.      . polyethylene glycol (MIRALAX / GLYCOLAX) packet Take 17 g by mouth daily as needed (for constipation).      . sitaGLIPtan-metformin (JANUMET) 50-500 MG per tablet Take 1 tablet by mouth 2 (two) times daily with a meal.      . SPIRIVA HANDIHALER 18 MCG inhalation capsule  USE 1 CAPSULE VIA HANDIHALER EVERY DAY  30 capsule  0  . spironolactone (ALDACTONE) 25 MG tablet Take 1 tablet (25 mg total) by mouth daily.  30 tablet  6  . tiotropium (SPIRIVA) 18 MCG inhalation capsule Place 18 mcg into inhaler and inhale daily.      [name]  traMADol (ULTRAM) 50 MG tablet Take 50-100 mg by mouth every 6 (six) hours as needed for moderate pain.      . traZODone (DESYREL) 100 MG tablet Take 150 mg by mouth at bedtime.      [name]. venlafaxine XR (EFFEXOR-XR) 150 MG 24 hr capsule Take 1 capsule (150 mg total) by mouth every morning.  90 capsule  3   Allergies:  Allergies  Allergen Reactions  . Amlodipine Other (See Comments)    constipation  . Ramipril Cough  . Adhesive [Tape] Hives    "peels skin off"  . Morphine And Related Other (See Comments)    Per Husband, Pt has hallucinations  . Penicillins Itching  . Codeine Rash    Family History  Problem Relation Age of Onset  . Stroke Mother   . Breast cancer Sister   . Emphysema Sister   . Cancer Sister     breast  . Alcohol abuse Brother   . Colon cancer      uncles x 2  . Prostate cancer Brother    Social History:  reports that she has been smoking Cigarettes.  She has a 6.24 pack-year smoking history. She has never used smokeless tobacco. She reports that she drinks alcohol. She reports that she does not use illicit drugs.  ROS: A complete review of systems was performed.  All systems are negative except for pertinent findings as noted. ROS   Physical Exam:  Vital signs in last 24 hours: Temp:  [97.7 F (36.5 C)-99 F (37.2 C)] 99 F (37.2 C) (01/02 0508) Pulse Rate:  [80-89] 89 (01/02 1030) Resp:  [18-20] 18 (01/02 0508) BP: (126-143)/(68-94) 127/94 mmHg (01/02 1030) SpO2:  [94 %-100 %] 98 % (01/02 0508) Weight:  [72.5 kg (159 lb 13.3 oz)] 72.5 kg (159 lb 13.3 oz) (01/02 0508) General:  Alert and oriented, No acute distress HEENT: Normocephalic, atraumatic Neck: No JVD or lymphadenopathy Cardiovascular:  Regular rate and rhythm Lungs: Regular rate and effort Abdomen: Soft, nontender, nondistended, no abdominal masses Back: No CVA tenderness Extremities: No edema Neurologic: Grossly intact  Laboratory Data:  Results for orders placed during the hospital encounter of 12/31/12 (from the past 24 hour(s))  GLUCOSE, CAPILLARY     Status: Abnormal   Collection Time    01/02/13  4:25 PM      Result Value Range   Glucose-Capillary 155 (*) 70 - 99 mg/dL   Comment 1 Notify RN     Comment 2 Documented in Chart    GLUCOSE, CAPILLARY     Status: Abnormal   Collection Time    01/02/13  8:57 PM      Result Value Range   Glucose-Capillary 181 (*) 70 - 99 mg/dL  BASIC METABOLIC PANEL     Status: Abnormal   Collection Time    01/03/13  5:20 AM      Result Value Range   Sodium 125 (*) 137 - 147 mEq/L   Potassium 4.9  3.7 - 5.3 mEq/L   Chloride 88 (*) 96 - 112 mEq/L   CO2 23  19 - 32 mEq/L   Glucose, Bld 125 (*) 70 - 99 mg/dL   BUN 24 (*) 6 - 23 mg/dL   Creatinine, Ser 1.611.43 (*) 0.50 - 1.10 mg/dL   Calcium 8.4  8.4 - 09.610.5 mg/dL   GFR calc non Af Amer 37 (*) >90 mL/min   GFR calc Af Amer 43 (*) >90 mL/min  GLUCOSE, CAPILLARY  Status: Abnormal   Collection Time    01/03/13  6:29 AM      Result Value Range   Glucose-Capillary 130 (*) 70 - 99 mg/dL   No results found for this or any previous visit (from the past 240 hour(s)). Creatinine:  Recent Labs  12/31/12 0652 01/01/13 0535 01/02/13 0607 01/03/13 0520  CREATININE 1.24* 1.27* 1.47* 1.43*    Impression/Assessment/Plan:  1) incomplete bladder emptying - her UOP is low which means her bladder fills very slowly. This may seem why she is not voiding often. Also, many pt carry a 500+ ml PVR and it does causes no symptoms or renal failure. I would continue foley for now and if she is discharged I can perform a void trial in the office in the next week or two.  2) CRF - Possible pre-renal (chf, diuresis, FTT, etc) vs. Post-renal (hydro or  elevated PVR leading to hydro). Again, many pt carry a 500+ PVR and it does not affect their kidney function. For example, her Cr was nl Apr - May 2014 and her CT showed a distended bladder but no hydro. She may carry a large PVR as her Cr was 1.24 on admission. However, will get a renal U/S to r/o hydronephrosis as her rise in CR is new.     Antony Haste 01/03/2013, 11:58 AM

## 2013-01-03 NOTE — Care Management Note (Unsigned)
    Page 1 of 1   01/03/2013     3:41:34 PM   CARE MANAGEMENT NOTE 01/03/2013  Patient:  [name],[name]   Account Number:  [id number]  Date Initiated:  01/03/2013  Documentation initiated by:  Laqueisha Catalina  Subjective/Objective Assessment:   PT ADM ON 12/30 WITH CHEST PAIN, SYNCOPE.  PTA, PT INDEPENDENT, LIVES WITH HUSBAND.     Action/Plan:   P.T. RECOMMENDING SNF AT DC.  CSW CONSULTED TO FACILITATE DC TO SNF WHEN MEDICALLY STABLE.   Anticipated DC Date:  01/06/2013   Anticipated DC Plan:  SKILLED NURSING FACILITY  In-house referral  Clinical Social Worker      DC Planning Services  CM consult      Choice offered to / List presented to:             Status of service:  In process, will continue to follow Medicare Important Message given?   (If response is "NO", the following Medicare IM given date fields will be blank) Date Medicare IM given:   Date Additional Medicare IM given:    Discharge Disposition:    Per UR Regulation:  Reviewed for med. necessity/level of care/duration of stay  If discussed at Long Length of Stay Meetings, dates discussed:    Comments:

## 2013-01-04 DIAGNOSIS — I2789 Other specified pulmonary heart diseases: Secondary | ICD-10-CM | POA: Diagnosis not present

## 2013-01-04 DIAGNOSIS — E1129 Type 2 diabetes mellitus with other diabetic kidney complication: Secondary | ICD-10-CM | POA: Diagnosis not present

## 2013-01-04 DIAGNOSIS — I5023 Acute on chronic systolic (congestive) heart failure: Secondary | ICD-10-CM | POA: Diagnosis not present

## 2013-01-04 DIAGNOSIS — E871 Hypo-osmolality and hyponatremia: Secondary | ICD-10-CM | POA: Diagnosis not present

## 2013-01-04 LAB — URINALYSIS, ROUTINE W REFLEX MICROSCOPIC
Bilirubin Urine: NEGATIVE
GLUCOSE, UA: NEGATIVE mg/dL
KETONES UR: NEGATIVE mg/dL
Nitrite: NEGATIVE
PH: 5 (ref 5.0–8.0)
PROTEIN: 30 mg/dL — AB
Specific Gravity, Urine: 1.011 (ref 1.005–1.030)
Urobilinogen, UA: 0.2 mg/dL (ref 0.0–1.0)

## 2013-01-04 LAB — BASIC METABOLIC PANEL
BUN: 26 mg/dL — ABNORMAL HIGH (ref 6–23)
CO2: 27 meq/L (ref 19–32)
CREATININE: 1.32 mg/dL — AB (ref 0.50–1.10)
Calcium: 8.5 mg/dL (ref 8.4–10.5)
Chloride: 87 mEq/L — ABNORMAL LOW (ref 96–112)
GFR calc Af Amer: 47 mL/min — ABNORMAL LOW (ref >90)
GFR, EST NON AFRICAN AMERICAN: 40 mL/min — AB (ref >90)
GLUCOSE: 181 mg/dL — AB (ref 70–99)
Potassium: 4.9 mEq/L (ref 3.7–5.3)
Sodium: 124 mEq/L — ABNORMAL LOW (ref 137–147)

## 2013-01-04 LAB — URINE MICROSCOPIC-ADD ON

## 2013-01-04 LAB — GLUCOSE, CAPILLARY
Glucose-Capillary: 114 mg/dL — ABNORMAL HIGH (ref 70–99)
Glucose-Capillary: 164 mg/dL — ABNORMAL HIGH (ref 70–99)
Glucose-Capillary: 145 mg/dL — ABNORMAL HIGH (ref 70–99)
Glucose-Capillary: 156 mg/dL — ABNORMAL HIGH (ref 70–99)

## 2013-01-04 NOTE — Progress Notes (Addendum)
Patient ID: Sumner BoastHattie B [name], female   DOB: 02/07/1945, 68 y.o.   MRN: [id number]  Pt without complaint. She's trying to sit on edge of bed to eat breakfast, but not strong enough to sit up straight. I had a tech come in to help her in a chair.   Filed Vitals:   01/04/13 0439  BP: 135/88  Pulse: 87  Temp: 97.3 F (36.3 C)  Resp: 18    Intake/Output Summary (Last 24 hours) at 01/04/13 0725 Last data filed at 01/04/13 0500  Gross per 24 hour  Intake    401 ml  Output   1025 ml  Net   -624 ml   PE: NAD Sitting sideways in bed, watching TV Urine clear   BMET    Component Value Date/Time   NA 125* 01/03/2013 0520   NA 138 04/16/2012 0936   K 4.9 01/03/2013 0520   K 4.0 04/16/2012 0936   CL 88* 01/03/2013 0520   CL 98 04/16/2012 0936   CO2 23 01/03/2013 0520   CO2 30* 04/16/2012 0936   GLUCOSE 125* 01/03/2013 0520   GLUCOSE 100* 04/16/2012 0936   BUN 24* 01/03/2013 0520   BUN 9.8 04/16/2012 0936   CREATININE 1.43* 01/03/2013 0520   CREATININE 1.33* 12/04/2012 1554   CREATININE 1.1 04/16/2012 0936   CALCIUM 8.4 01/03/2013 0520   CALCIUM 8.9 04/16/2012 0936   GFRNONAA 37* 01/03/2013 0520   GFRAA 43* 01/03/2013 0520   Renal U/S - normal kidneys. No hydro. I reviewed all the images.   Imp / plan: 1) CRF - stable, likely pre-renal, no hydro on CT or US. Cr hasn't changed with foley.  2) Infrequent voiding/incomplete emptying - I'm still not certain this is of clinical significance (causing the patient discomfort, causing hydro/ARF, etc.). However, I would continue foley and let me see her in office for a void trial. I think much of her urinary retention is functional (e.g. She's having trouble sitting on the edge of the bed to eat, so I wouldn't anticipate she could get out of bed for toileting). If her this might improve with PT, I would continue foley until she gets a bit stronger and is ambulatory. I'll see pt back in 1-2 weeks for a void trial in the office.

## 2013-01-04 NOTE — Progress Notes (Signed)
Patient called out complaining of chest pain. EKG done - showed first degree HB. Asked patient how she was feeling and she stated that she has gas pains. Told patient that I would be back with her morning medications with 80 mg of Protonix.  Patient seems more confused today that when I had her on Thursday and husband expressed the same concern. Will contact the MD regarding increased confusion. Will continue to monitor closely. Lajuana Matteina Tavonte Seybold, RN

## 2013-01-04 NOTE — Progress Notes (Signed)
    SUBJECTIVE:  No chest pain.  She is very weak.     PHYSICAL EXAM Filed Vitals:   01/03/13 1336 01/03/13 2029 01/04/13 0439 01/04/13 1005  BP: 116/70 134/81 135/88   Pulse: 81 77 87   Temp: 99.4 F (37.4 C) 98.6 F (37 C) 97.3 F (36.3 C)   TempSrc: Oral Oral Oral   Resp: 17 18 18    Height:      Weight:   160 lb 4.4 oz (72.7 kg)   SpO2: 94% 100% 99% 97%   General:  No acute distress but very weak. Lungs:  Good breath sounds Heart:  RRR Abdomen:  Positive bowel sounds, no rebound no guarding Extremities:  Trace edema.  No trauma to right lower leg.  No cord.    LABS:  Results for orders placed during the hospital encounter of 12/31/12 (from the past 24 hour(s))  GLUCOSE, CAPILLARY     Status: Abnormal   Collection Time    01/03/13 11:59 AM      Result Value Range   Glucose-Capillary 229 (*) 70 - 99 mg/dL   Comment 1 Notify RN     Comment 2 Documented in Chart    GLUCOSE, CAPILLARY     Status: Abnormal   Collection Time    01/03/13  4:44 PM      Result Value Range   Glucose-Capillary 248 (*) 70 - 99 mg/dL   Comment 1 Documented in Chart     Comment 2 Notify RN    GLUCOSE, CAPILLARY     Status: Abnormal   Collection Time    01/03/13  9:11 PM      Result Value Range   Glucose-Capillary 165 (*) 70 - 99 mg/dL  GLUCOSE, CAPILLARY     Status: Abnormal   Collection Time    01/04/13  6:36 AM      Result Value Range   Glucose-Capillary 114 (*) 70 - 99 mg/dL    Intake/Output Summary (Last 24 hours) at 01/04/13 1114 Last data filed at 01/04/13 0500  Gross per 24 hour  Intake    281 ml  Output   1025 ml  Net   -744 ml    ASSESSMENT AND PLAN:  ACUTE ON CHRONIC SYSTOLIC CHF:   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  Continue IV diuresis.  EF 20% by echo this admit with moderate MR.   BMET pending.   I will hold the Demadex for now.  URINARY RETENTION:  I appreciate urology input.  Continue foley with a voiding trial as an outpatient.   No acute findings on the ultrasound.     CAD:  Continue medical management.    CKD:  BMET pending  WEAKNESS:   OT and PT have been consulted. She will go to SNF at discharge.    Hyponatremia:  Labs pending.  This is likely related to CHF and meds.  I will continue to follow.  Hold Demadex.  Leg pain:  Resolved  Rollene RotundaJames Maile Linford 01/04/2013 11:14 AM

## 2013-01-05 LAB — BASIC METABOLIC PANEL
BUN: 29 mg/dL — ABNORMAL HIGH (ref 6–23)
CO2: 28 meq/L (ref 19–32)
Calcium: 8.8 mg/dL (ref 8.4–10.5)
Chloride: 90 mEq/L — ABNORMAL LOW (ref 96–112)
Creatinine, Ser: 1.34 mg/dL — ABNORMAL HIGH (ref 0.50–1.10)
GFR calc non Af Amer: 40 mL/min — ABNORMAL LOW (ref >90)
GFR, EST AFRICAN AMERICAN: 46 mL/min — AB (ref >90)
Glucose, Bld: 124 mg/dL — ABNORMAL HIGH (ref 70–99)
POTASSIUM: 4.9 meq/L (ref 3.7–5.3)
Sodium: 128 mEq/L — ABNORMAL LOW (ref 137–147)

## 2013-01-05 LAB — GLUCOSE, CAPILLARY
GLUCOSE-CAPILLARY: 118 mg/dL — AB (ref 70–99)
GLUCOSE-CAPILLARY: 220 mg/dL — AB (ref 70–99)
Glucose-Capillary: 442 mg/dL — ABNORMAL HIGH (ref 70–99)
Glucose-Capillary: 156 mg/dL — ABNORMAL HIGH (ref 70–99)
Glucose-Capillary: 146 mg/dL — ABNORMAL HIGH (ref 70–99)

## 2013-01-05 NOTE — Progress Notes (Signed)
SUBJECTIVE:  No chest pain.  She is very weak.     PHYSICAL EXAM Filed Vitals:   01/04/13 1005 01/04/13 1451 01/04/13 2025 01/05/13 0507  BP:  119/82 131/75 141/95  Pulse:  91 81 81  Temp:  97.5 F (36.4 C) 98.6 F (37 C) 97.5 F (36.4 C)  TempSrc:  Oral Oral Oral  Resp:  18 18 18   Height:      Weight:    158 lb 15.2 oz (72.1 kg)  SpO2: 97% 99% 100% 100%   General:  No acute distress but very weak. Lungs:  Good breath sounds Heart:  RRR Abdomen:  Positive bowel sounds, no rebound no guarding Extremities:  Trace edema.  No trauma to right lower leg.  No cord.    LABS:  Results for orders placed during the hospital encounter of 12/31/12 (from the past 24 hour(s))  GLUCOSE, CAPILLARY     Status: Abnormal   Collection Time    01/04/13 11:21 AM      Result Value Range   Glucose-Capillary 164 (*) 70 - 99 mg/dL  URINALYSIS, ROUTINE W REFLEX MICROSCOPIC     Status: Abnormal   Collection Time    01/04/13 12:00 PM      Result Value Range   Color, Urine YELLOW  YELLOW   APPearance TURBID (*) CLEAR   Specific Gravity, Urine 1.011  1.005 - 1.030   pH 5.0  5.0 - 8.0   Glucose, UA NEGATIVE  NEGATIVE mg/dL   Hgb urine dipstick LARGE (*) NEGATIVE   Bilirubin Urine NEGATIVE  NEGATIVE   Ketones, ur NEGATIVE  NEGATIVE mg/dL   Protein, ur 30 (*) NEGATIVE mg/dL   Urobilinogen, UA 0.2  0.0 - 1.0 mg/dL   Nitrite NEGATIVE  NEGATIVE   Leukocytes, UA LARGE (*) NEGATIVE  URINE MICROSCOPIC-ADD ON     Status: Abnormal   Collection Time    01/04/13 12:00 PM      Result Value Range   Squamous Epithelial / LPF RARE  RARE   WBC, UA TOO NUMEROUS TO COUNT  <3 WBC/hpf   RBC / HPF 11-20  <3 RBC/hpf   Bacteria, UA MANY (*) RARE   Casts GRANULAR CAST (*) NEGATIVE   Urine-Other MUCOUS PRESENT    BASIC METABOLIC PANEL     Status: Abnormal   Collection Time    01/04/13 12:20 PM      Result Value Range   Sodium 124 (*) 137 - 147 mEq/L   Potassium 4.9  3.7 - 5.3 mEq/L   Chloride 87 (*) 96 -  112 mEq/L   CO2 27  19 - 32 mEq/L   Glucose, Bld 181 (*) 70 - 99 mg/dL   BUN 26 (*) 6 - 23 mg/dL   Creatinine, Ser 1.611.32 (*) 0.50 - 1.10 mg/dL   Calcium 8.5  8.4 - 09.610.5 mg/dL   GFR calc non Af Amer 40 (*) >90 mL/min   GFR calc Af Amer 47 (*) >90 mL/min  GLUCOSE, CAPILLARY     Status: Abnormal   Collection Time    01/04/13  4:26 PM      Result Value Range   Glucose-Capillary 145 (*) 70 - 99 mg/dL  GLUCOSE, CAPILLARY     Status: Abnormal   Collection Time    01/04/13  8:33 PM      Result Value Range   Glucose-Capillary 156 (*) 70 - 99 mg/dL  BASIC METABOLIC PANEL     Status: Abnormal  Collection Time    01/05/13  6:05 AM      Result Value Range   Sodium 128 (*) 137 - 147 mEq/L   Potassium 4.9  3.7 - 5.3 mEq/L   Chloride 90 (*) 96 - 112 mEq/L   CO2 28  19 - 32 mEq/L   Glucose, Bld 124 (*) 70 - 99 mg/dL   BUN 29 (*) 6 - 23 mg/dL   Creatinine, Ser 1.61 (*) 0.50 - 1.10 mg/dL   Calcium 8.8  8.4 - 09.6 mg/dL   GFR calc non Af Amer 40 (*) >90 mL/min   GFR calc Af Amer 46 (*) >90 mL/min  GLUCOSE, CAPILLARY     Status: Abnormal   Collection Time    01/05/13  6:06 AM      Result Value Range   Glucose-Capillary 118 (*) 70 - 99 mg/dL    Intake/Output Summary (Last 24 hours) at 01/05/13 1108 Last data filed at 01/04/13 1710  Gross per 24 hour  Intake    480 ml  Output    851 ml  Net   -371 ml    ASSESSMENT AND PLAN:  ACUTE ON CHRONIC SYSTOLIC CHF:   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  Continue IV diuresis.  EF 20% by echo this admit with moderate MR.   Demadex has been on hold.    URINARY RETENTION:  I appreciate urology input.  Continue foley with a voiding trial as an outpatient.   No acute findings on the ultrasound.    CAD:  Continue medical management.    CKD:  Creat has been stable.    WEAKNESS:   I hope to send her to rehab tomorrow.   Hyponatremia:  Na is low but OK.  Continue to follow.   Leg pain:  Resolved  [name] 01/05/2013 11:08 AM

## 2013-01-05 NOTE — Progress Notes (Signed)
[facility]  She also c/o visual and auditory hallucinations, worse in the evening, and wonders if one of her meds may be causing this.

## 2013-01-06 DIAGNOSIS — R5381 Other malaise: Secondary | ICD-10-CM

## 2013-01-06 DIAGNOSIS — I5023 Acute on chronic systolic (congestive) heart failure: Secondary | ICD-10-CM

## 2013-01-06 DIAGNOSIS — N183 Chronic kidney disease, stage 3 unspecified: Secondary | ICD-10-CM

## 2013-01-06 DIAGNOSIS — I255 Ischemic cardiomyopathy: Secondary | ICD-10-CM

## 2013-01-06 DIAGNOSIS — R39198 Other difficulties with micturition: Secondary | ICD-10-CM

## 2013-01-06 LAB — GLUCOSE, CAPILLARY
Glucose-Capillary: 113 mg/dL — ABNORMAL HIGH (ref 70–99)
Glucose-Capillary: 131 mg/dL — ABNORMAL HIGH (ref 70–99)
Glucose-Capillary: 100 mg/dL — ABNORMAL HIGH (ref 70–99)
Glucose-Capillary: 218 mg/dL — ABNORMAL HIGH (ref 70–99)

## 2013-01-06 LAB — URINE CULTURE: Colony Count: >=100000

## 2013-01-06 MED ORDER — FUROSEMIDE 80 MG PO TABS
80.0000 mg | ORAL_TABLET | Freq: Two times a day (BID) | ORAL | Status: DC
  Administered 2013-01-06 – 2013-01-07 (×3): 80 mg via ORAL
  Filled 2013-01-06 (×5): qty 1

## 2013-01-06 NOTE — Progress Notes (Signed)
Per MD, patient not ready for SNF today. CSW notified [facility].  Maree KrabbeLindsay Burnette Sautter, MSW, Theresia MajorsLCSWA [phone]

## 2013-01-06 NOTE — Progress Notes (Signed)
Physical Therapy Treatment Patient Details Name: [name] MRN: [id number] DOB: 11/17/1945 Today's Date: 01/06/2013 Time: [id number] PT Time Calculation (min): 28 min  PT Assessment / Plan / Recommendation  History of Present Illness [name] is a 68 y.o. female with a history of CAD, chronic systolic CHF (EF 47-82%10-15%), syncope in the setting of 2:1 heart block s/p ICD placement, COPD- on home night O2, T2DM, iron deficiency anemia, HTN, and dementia who presented to the ED today complaining of chest pain and SOB.   PT Comments   Pt still very weak and [facility] to build strength and improve conditioning.  Follow Up Recommendations  SNF     Does the patient have the potential to tolerate intense rehabilitation     Barriers to Discharge        Equipment Recommendations  None recommended by PT    Recommendations for Other Services    Frequency Min 3X/week   Progress towards PT Goals Progress towards PT goals: Progressing toward goals  Plan Current plan remains appropriate    Precautions / Restrictions Precautions Precautions: Fall;ICD/Pacemaker Precaution Comments: LUE fx   Pertinent Vitals/Pain     Mobility  Bed Mobility Bed Mobility: Supine to Sit;Sitting - Scoot to Edge of Bed Supine to Sit: 4: Min assist;HOB elevated;With rails (pt =75%) Sitting - Scoot to Edge of Bed: 3: Mod assist Details for Bed Mobility Assistance: assist initiating movement up ontl L elbow.  help building momentum/coming forward to scoot Transfers Transfers: Sit to Stand;Stand to Sit;Stand Pivot Transfers Sit to Stand: 4: Min assist;With upper extremity assist;From bed;From chair/3-in-1 (mod from lower chair) Stand to Sit: 4: Min assist;With upper extremity assist;To chair/3-in-1 Stand Pivot Transfers: 3: Mod assist Ambulation/Gait Ambulation/Gait Assistance: 3: Mod assist Ambulation Distance (Feet): 5 Feet (forw/back, 10 feet forw/back) Assistive device: 1 person  hand held assist Ambulation/Gait Assistance Details: staggery gait, mildly unsteady, moderate list posteriorly Gait Pattern: Step-through pattern;Decreased step length - right;Decreased step length - left Stairs: No    Exercises General Exercises - Upper Extremity Shoulder Flexion: Both;Strengthening;Supine (7 reps) Elbow Flexion: Strengthening;Both;Other (comment) (7 reps) Elbow Extension: Strengthening;Both;Supine (7 reps) General Exercises - Lower Extremity Heel Slides: Strengthening;Both;10 reps Hip ABduction/ADduction: Strengthening;AAROM;Both;15 reps   PT Diagnosis:    PT Problem List:   PT Treatment Interventions:     PT Goals (current goals can now be found in the care plan section) Acute Rehab PT Goals PT Goal Formulation: With patient Time For Goal Achievement: 01/14/13 Potential to Achieve Goals: Good  Visit Information  Last PT Received On: 01/06/13 Assistance Needed: +1 History of Present Illness: [name] is a 68 y.o. female with a history of CAD, chronic systolic CHF (EF 95-62%10-15%), syncope in the setting of 2:1 heart block s/p ICD placement, COPD- on home night O2, T2DM, iron deficiency anemia, HTN, and dementia who presented to the ED today complaining of chest pain and SOB.    Subjective Data  Subjective: I just couldn't wake up today   Cognition  Cognition Arousal/Alertness: Awake/alert Behavior During Therapy: WFL for tasks assessed/performed Overall Cognitive Status: Within Functional Limits for tasks assessed Following Commands: Follows one step commands consistently    Balance  Balance Balance Assessed: Yes Static Sitting Balance Static Sitting - Balance Support: Bilateral upper extremity supported Static Sitting - Level of Assistance: 5: Stand by assistance Static Standing Balance Static Standing - Balance Support: Right upper extremity supported;Left upper extremity supported Static Standing - Level of Assistance:  4: Min assist;3: Mod  assist Static Standing - Comment/# of Minutes: tended to list posteriorly at times today  End of Session PT - End of Session Equipment Utilized During Treatment: Gait belt Activity Tolerance: Patient limited by fatigue Patient left: in chair;with call bell/phone within reach Nurse Communication: Mobility status   GP     [name], Eliseo Gum 01/06/2013, 5:42 PM 01/06/2013  [place] Bing, PT [phone]  (pager)

## 2013-01-06 NOTE — Progress Notes (Addendum)
Clinical Social Work Department CLINICAL SOCIAL WORK PLACEMENT NOTE 01/06/2013  Patient:  Sumner BoastLEXANDER,Miyanna B  Account Number:  [id number] Admit date:  12/31/2012  Clinical Social Worker:  Carren RangLINDSAY Vesta [place]eland, LCSWA  Date/time:  01/01/2013 11:29 AM  Clinical Social Work is seeking post-discharge placement for this patient at the following level of care:   SKILLED NURSING   (*CSW will update this form in Epic as items are completed)   01/01/2013  Patient/family provided with [facility] of [facility] Work's list of facilities offering this level of care within the geographic area requested by the patient (or if unable, by the patient's family).  01/01/2013  Patient/family informed of their freedom to choose among providers that offer the needed level of care, that participate in Medicare, Medicaid or managed care program needed by the patient, have an available bed and are willing to accept the patient.  01/01/2013  Patient/family informed of [facility]' ownership interest in [facility], as well as of the fact that they are under no obligation to receive care at this facility.  PASARR submitted to EDS on 01/01/2013 PASARR number received from EDS on 01/01/2013  FL2 transmitted to all facilities in geographic area requested by pt/family on  01/01/2013 FL2 transmitted to all facilities within larger geographic area on   Patient informed that his/her managed care company has contracts with or will negotiate with  certain facilities, including the following:     Patient/family informed of bed offers received:  01/03/2013 [facility] Physician recommends and patient chooses bed at    [facility]   Patient to be transferred to facility by   The following physician request were entered in Epic:   Additional Comments:  Maree KrabbeLindsay Celine Dishman, MSW, [employer]CSWA [phone]

## 2013-01-06 NOTE — Progress Notes (Signed)
Patient Name: [name] Date of Encounter: 01/06/2013   Principal Problem:   Acute on chronic systolic CHF (congestive heart failure) Active Problems:   Cardiomyopathy, ischemic   Physical deconditioning   CORONARY ARTERY DISEASE   Dementia   Difficulty voiding   CKD (chronic kidney disease), stage III   Hyperlipidemia LDL goal < 70   HYPERTENSION   Moderate COPD (chronic obstructive pulmonary disease)   Iron deficiency anemia   Type II or unspecified type diabetes mellitus with renal manifestations, uncontrolled(250.42)    SUBJECTIVE  No dyspnea.  Very weak - can't even sit up in bed.  Reports intermittent chest pain that is worse with deep breathing and also lower abdominal pain, which she says she's had for years.    CURRENT MEDS . aspirin EC  81 mg Oral Daily  . atorvastatin  40 mg Oral q1800  . carvedilol  6.25 mg Oral BID WC  . enoxaparin (LOVENOX) injection  40 mg Subcutaneous Q24H  . folic acid-pyridoxine-cyancobalamin  1 tablet Oral Daily  . gabapentin  300 mg Oral BID  . hydrOXYzine  10 mg Oral BID  . insulin aspart  0-9 Units Subcutaneous TID WC  . isosorbide mononitrate  60 mg Oral Q1200  . losartan  50 mg Oral Daily  . magnesium oxide  400 mg Oral Daily  . pantoprazole  80 mg Oral Daily  . sodium chloride  3 mL Intravenous Q12H  . spironolactone  25 mg Oral Daily  . tiotropium  18 mcg Inhalation Daily  . traZODone  150 mg Oral QHS  . venlafaxine XR  150 mg Oral q morning - 10a    OBJECTIVE  Filed Vitals:   01/05/13 1132 01/05/13 1420 01/05/13 2133 01/06/13 0433  BP:  124/80 106/68 126/72  Pulse:  84 81 84  Temp:  97.7 F (36.5 C) 98.3 F (36.8 C) 98.8 F (37.1 C)  TempSrc:  Oral Oral Oral  Resp:  18 18 18   Height:      Weight:    162 lb 4.1 oz (73.6 kg)  SpO2: 97% 97% 100% 98%    Intake/Output Summary (Last 24 hours) at 01/06/13 0840 Last data filed at 01/06/13 0435  Gross per 24 hour  Intake    240 ml  Output    450 ml  Net    -210 ml   Filed Weights   01/04/13 0439 01/05/13 0507 01/06/13 0433  Weight: 160 lb 4.4 oz (72.7 kg) 158 lb 15.2 oz (72.1 kg) 162 lb 4.1 oz (73.6 kg)    PHYSICAL EXAM  General: Pleasant, NAD.  Groggy. Neuro: Alert and oriented X 3. Moves all extremities spontaneously. Psych: Normal affect. HEENT:  Normal  Neck: Supple without bruits.  Neck veins to jaw. Lungs:  Resp regular and unlabored, few left basilar crackles. Heart: RRR no s3, s4, or murmurs. Abdomen: semi-firm, diffusely tender, non-distended, BS + x 4.  Extremities: No clubbing, cyanosis, trace bilat LE edema. DP/PT/Radials 2+ and equal bilaterally.  Accessory Clinical Findings  Basic Metabolic Panel  Recent Labs  01/04/13 1220 01/05/13 0605  NA 124* 128*  K 4.9 4.9  CL 87* 90*  CO2 27 28  GLUCOSE 181* 124*  BUN 26* 29*  CREATININE 1.32* 1.34*  CALCIUM 8.5 8.8    TELE  A sensed, v paced, 1st deg avb.  Radiology/Studies  US Renal  01/03/2013   CLINICAL DATA:  Chronic renal failure  EXAM: RENAL/URINARY TRACT ULTRASOUND COMPLETE  COMPARISON:  None.  FINDINGS: Right Kidney:  Length: 9.5 cm in length. Slight increased echogenicity is noted. No mass lesion or hydronephrosis is seen.  Left Kidney:  Length: 9.9 cm in length. Increased echogenicity is noted. No mass lesion or hydronephrosis is seen.  Bladder:  Decompressed by Foley catheter.  Bilateral pleural effusions are noted.  IMPRESSION: Bilateral pleural effusions.  Increased echogenicity consistent with medical renal disease.   Electronically Signed   By: Alcide CleverMark  Lukens M.D.   On: 01/03/2013 15:20   ASSESSMENT AND PLAN  1.  Acute on chronic systolic chf/ICM:  Diuretics have been on hold in setting of mild renal insufficiency following admission.  Her breathing is stable but her weight is roughly 20 lbs above where she was at in December.  Neck veins are elevated however it's important to note that she also has pulm htn and sev TR.  Lungs are relatively clear.  She  was last d/c'd on lasix 80mg  po bid after being on much higher doses prior to that admission.  Upon admission here, she was placed on demadex but hasn't received any since the third.  Resume lasix 80mg  po bid.  Cont bb, arb, spiro, hydral/nitrate.  Will ask CM to see again today to assist with SNF placement.  2.  CKD III:  Stable.  3.  Severe Deconditioning:  SNF @ d/c. Of note, pt has foley.  4.  Difficulty Voiding:  Appreciate urology recs - outpt f/u with voiding trial.  She is to be discharged with a foley.  5. HTN:  Stable.  6.  DMII:  Cont ssi.  A1c 6.5 on 12/30.  On janumet at home - resume at d/c.  Signed, Nicolasa Duckinghristopher Berge NP As above, patient seen and examined. Her weight is increasing. Neck veins are elevated. Resume Lasix and follow renal function. We will ask case management to assess for rehabilitation placement. She will need close followup with urology after discharge. Olga MillersBrian Crenshaw

## 2013-01-07 LAB — BASIC METABOLIC PANEL
BUN: 34 mg/dL — AB (ref 6–23)
CHLORIDE: 87 meq/L — AB (ref 96–112)
CO2: 27 meq/L (ref 19–32)
Calcium: 8 mg/dL — ABNORMAL LOW (ref 8.4–10.5)
Creatinine, Ser: 1.44 mg/dL — ABNORMAL HIGH (ref 0.50–1.10)
GFR calc Af Amer: 42 mL/min — ABNORMAL LOW (ref >90)
GFR calc non Af Amer: 36 mL/min — ABNORMAL LOW (ref >90)
GLUCOSE: 128 mg/dL — AB (ref 70–99)
POTASSIUM: 4.8 meq/L (ref 3.7–5.3)
Sodium: 128 mEq/L — ABNORMAL LOW (ref 137–147)

## 2013-01-07 LAB — GLUCOSE, CAPILLARY
GLUCOSE-CAPILLARY: 128 mg/dL — AB (ref 70–99)
GLUCOSE-CAPILLARY: 170 mg/dL — AB (ref 70–99)

## 2013-01-07 MED ORDER — FUROSEMIDE 80 MG PO TABS
80.0000 mg | ORAL_TABLET | Freq: Two times a day (BID) | ORAL | Status: AC
Start: 2013-01-07 — End: ?

## 2013-01-07 MED ORDER — CIPROFLOXACIN HCL 500 MG PO TABS
500.0000 mg | ORAL_TABLET | Freq: Two times a day (BID) | ORAL | Status: DC
Start: 2013-01-07 — End: 2013-01-07
  Administered 2013-01-07: 500 mg via ORAL
  Filled 2013-01-07 (×3): qty 1

## 2013-01-07 MED ORDER — CIPROFLOXACIN HCL 500 MG PO TABS: 500.0000 mg | ORAL_TABLET | Freq: Two times a day (BID) | ORAL | Status: DC

## 2013-01-07 NOTE — Progress Notes (Signed)
Clinical Social Work Department CLINICAL SOCIAL WORK PLACEMENT NOTE 01/07/2013  Patient:  [name],[name]  Account Number:  [id number] Admit date:  12/31/2012  Clinical Social Worker:  Carren RangLINDSAY Sriman Tally, LCSWA  Date/time:  01/01/2013 11:29 AM  Clinical Social Work is seeking post-discharge placement for this patient at the following level of care:   SKILLED NURSING   (*CSW will update this form in Epic as items are completed)   01/01/2013  Patient/family provided with [facility] of Clinical Social Work's list of facilities offering this level of care within the geographic area requested by the patient (or if unable, by the patient's family).  01/01/2013  Patient/family informed of their freedom to choose among providers that offer the needed level of care, that participate in Medicare, Medicaid or managed care program needed by the patient, have an available bed and are willing to accept the patient.  01/01/2013  Patient/family informed of [facility]' ownership interest in [facility], as well as of the fact that they are under no obligation to receive care at this facility.  PASARR submitted to EDS on 01/01/2013 PASARR number received from EDS on 01/01/2013  FL2 transmitted to all facilities in geographic area requested by pt/family on  01/01/2013 FL2 transmitted to all facilities within larger geographic area on   Patient informed that his/her managed care company has contracts with or will negotiate with  certain facilities, including the following:     Patient/family informed of bed offers received:  01/03/2013 [facility] Physician recommends and patient chooses bed at    [facility]  01/07/2013 Patient to be transferred to facility by EMS  The following physician request were entered in Epic:   Additional Comments:  Maree KrabbeLindsay Forrest Demuro, MSW, [employer]CSWA [phone]

## 2013-01-07 NOTE — Progress Notes (Signed)
NURSING PROGRESS NOTE  [name] [id number] Discharge Data: 01/07/2013 8:29 PM Attending Provider: No att. providers found BMW:[id number]:Thomas Yetta BarreJones, MD     [name] to be D/C'd Skilled nursing facility per MD order.  Discussed with the patient the After Visit Summary and all questions fully answered. All IV's discontinued with no bleeding noted. All belongings returned to patient for patient to take home.   Last Vital Signs:  Blood pressure 127/79, pulse 68, temperature 97.5 F (36.4 C), temperature source Oral, resp. rate 16, height 5' 9.5" (1.765 m), weight 72.5 kg (159 lb 13.3 oz), SpO2 95.00%.  Discharge Medication List   Medication List         acetaminophen 500 MG tablet  Commonly known as:  TYLENOL  Take 500 mg by mouth every 6 (six) hours as needed for pain.     albuterol (2.5 MG/3ML) 0.083% nebulizer solution  Commonly known as:  PROVENTIL  Take 2.5 mg by nebulization every 6 (six) hours as needed for wheezing.     aspirin 81 MG tablet  Take 81 mg by mouth daily.     atorvastatin 40 MG tablet  Commonly known as:  LIPITOR  Take 40 mg by mouth every morning.     carvedilol 12.5 MG tablet  Commonly known as:  COREG  Take 0.5 tablets (6.25 mg total) by mouth 2 (two) times daily with a meal.     ciprofloxacin 500 MG tablet  Commonly known as:  CIPRO  Take 1 tablet (500 mg total) by mouth 2 (two) times daily.     diclofenac sodium 1 % Gel  Commonly known as:  VOLTAREN  Apply 2 g topically 4 (four) times daily.     feeding supplement (GLUCERNA SHAKE) Liqd  Take 237 mLs by mouth as needed.     FERRAPLUS 90 90-1 MG Tabs  Take 1 tablet by mouth daily.     furosemide 80 MG tablet  Commonly known as:  LASIX  Take 1 tablet (80 mg total) by mouth 2 (two) times daily.     gabapentin 300 MG capsule  Commonly known as:  NEURONTIN  Take 1 capsule (300 mg total) by mouth 2 (two) times daily.     hydrOXYzine 10 MG tablet  Commonly known as:  ATARAX/VISTARIL   Take 1 tablet (10 mg total) by mouth 2 (two) times daily.     isosorbide mononitrate 60 MG 24 hr tablet  Commonly known as:  IMDUR  Take 60 mg by mouth daily at 12 noon.     losartan 50 MG tablet  Commonly known as:  COZAAR  Take 1 tablet (50 mg total) by mouth daily.     magnesium oxide 400 MG tablet  Commonly known as:  MAG-OX  Take 1 tablet (400 mg total) by mouth daily.     nitroGLYCERIN 0.4 MG SL tablet  Commonly known as:  NITROSTAT  Place 1 tablet (0.4 mg total) under the tongue every 5 (five) minutes x 3 doses as needed for chest pain.     omeprazole 40 MG capsule  Commonly known as:  PRILOSEC  Take 40 mg by mouth daily.     polyethylene glycol packet  Commonly known as:  MIRALAX / GLYCOLAX  Take 17 g by mouth daily as needed (for constipation).     SIMPLE DIAGNOSTICS LANCING DEV Misc     sitaGLIPtin-metformin 50-500 MG per tablet  Commonly known as:  JANUMET  Take 1 tablet by mouth 2 (two)  times daily with a meal.     spironolactone 25 MG tablet  Commonly known as:  ALDACTONE  Take 1 tablet (25 mg total) by mouth daily.     tiotropium 18 MCG inhalation capsule  Commonly known as:  SPIRIVA  Place 18 mcg into inhaler and inhale daily.     traMADol 50 MG tablet  Commonly known as:  ULTRAM  Take 50-100 mg by mouth every 6 (six) hours as needed for moderate pain.     traZODone 100 MG tablet  Commonly known as:  DESYREL  Take 150 mg by mouth at bedtime.     venlafaxine XR 150 MG 24 hr capsule  Commonly known as:  EFFEXOR-XR  Take 1 capsule (150 mg total) by mouth every morning.

## 2013-01-07 NOTE — Discharge Summary (Signed)
Discharge Summary   Patient ID: [name] BoastHattie B [name],  MRN: [id number], DOB/AGE: 69/05/1945 69 y.o.  Admit date: 12/31/2012 Discharge date: 01/07/2013  Primary Care Provider: Sanda Lingerhomas Jones Primary Cardiologist: J. Hochrein, MD / S. Graciela HusbandsKlein, MD (EP) / D. Bensimhon, MD (new CHF Clinic referral made)  Discharge Diagnoses Principal Problem:   Acute on chronic systolic CHF (congestive heart failure)  **Net negative diuresis of 3.9 Liters this admission with discharge weight of 159 lbs.  Active Problems:   Cardiomyopathy, ischemic   Physical deconditioning  **Discharge to skilled nursing facility.   CORONARY ARTERY DISEASE   Dementia   Difficulty voiding  **Urology consult during this admission with f/u arranged.  **Discharged with foley in place.   CKD (chronic kidney disease), stage III   Hyperlipidemia LDL goal < 70   HYPERTENSION   Moderate COPD (chronic obstructive pulmonary disease)   Iron deficiency anemia   Type II or unspecified type diabetes mellitus with renal manifestations, uncontrolled(250.42)   UTI (Klebsiella)  **Cipro therapy initiated on this admission.  Allergies Allergies  Allergen Reactions  . Amlodipine Other (See Comments)    constipation  . Ramipril Cough  . Adhesive [Tape] Hives    "peels skin off"  . Morphine And Related Other (See Comments)    Per Husband, Pt has hallucinations  . Penicillins Itching  . Codeine Rash   Procedures  2D Echocardiogram 12.20.2014  Study Conclusions  - Left ventricle: The cavity size was severely dilated. Wall thickness was normal. The estimated ejection fraction was 20%. Diffuse hypokinesis. - Mitral valve: Moderate regurgitation. - Left atrium: The atrium was moderately dilated. - Right ventricle: The cavity size was mildly dilated. - Right atrium: The atrium was moderately dilated. - Atrial septum: No defect or patent foramen ovale was identified. - Tricuspid valve: Severe regurgitation. - Pulmonary arteries:  PA peak pressure: 74mm Hg (S). - Impressions: Restrictive mitral inflow pattern _____________   Bilateral Lower Extremity Ultrasound 12.30.2014  Summary: No evidence of deep vein or superficial thrombosis involving the right lower extremity and left lower extremity. _____________   History of Present Illness  68 y/o female with a history of CAD, ischemic cardiomyopathy with chronic systolic chf, COPD on home O2, type II diabetes mellitus, hypertension, and dementia who was recently discharged from [facility] on 12/24/2012, following Bi-Ventricular AICD placement on 12/15.  Her hospital course at that time was prolonged secondary to malaise, deconditioning, urinary/voiding difficulties, hypoxia, and hypotension.  It was recommended by physical therapy that she be discharged to a skilled nursing facility however patient refused and instead opted for home health assistance.  She [facility] ED [facility]/30 secondary to sharp and stabbing left abdominal and chest discomfort associated with dyspnea, nausea, and lightheadedness.  She also reported worsening orthopnea, R > L lower extremity edema, and PND.  Pro-BNP was elevated @ 29K, while CXR showed pulmonary vasculature congestion.  She was admitted for further evaluation and diuresis.  Hospital Course  With diuresis, she had initial improvement in respiratory status.  Her ICD was interrogated and her AV delay was reprogrammed to better optimize device function.  Echocardiogram was performed on 12/30, showing an EF of 20% (up from 10-15%) with moderate mitral regurgitation.  Because of asymmetric lower extremity swelling, a lower extremity ultrasound was performed and was negative for DVT.  With further diuresis, her creatinine bumped to a peak of 1.47 on 01/02/2013.  She initially had a foley catheter however this was discontinued and following discontinuation on  1/1, she had difficulty voiding.  She has a prior history of this, and also had  this experience while hospitalized in December.  Urology was consulted and it was felt that her urine output may be low in the absence of a foley due to slow bladder filling.  Recommendation was made for continuation of foley catheter with outpatient urology follow-up and voiding trial in 1-2 wks.  This has been arranged.  She has also been diagnosed with a Klebsiella UTI and antibiotic therapy has been initiated prior to discharge.  In the setting of rising creatinine (peak 1.47 on 01/02/2013), her diuretics were placed on hold.  Creatinine did improve, however her weight climbed to 162 lbs by January 5th, and she was noted to have evidence of volume overload. Lasix 80mg  BID was resumed with reduction in weight to 159 lbs.  Creatinine this morning is 1.44.  Patient has been weak and deconditioned throughout this hospitalization.  She has again been seen by physical and occupational therapy with recommendation for SNF placement.  Pt is now agreeable.  Social work has worked with the patient and family to secure a bed at [facility], to where she will be discharged today.  She will need a follow-up basic metabolic profile on 01/08/2013.  Given her severe cardiomyopathy and propensity for readmission related to CHF, we have arranged for follow-up in the [facility] next week.  Discharge Vitals Blood pressure 127/79, pulse 68, temperature 97.5 F (36.4 C), temperature source Oral, resp. rate 16, height 5' 9.5" (1.765 m), weight 159 lb 13.3 oz (72.5 kg), SpO2 95.00%.  Filed Weights   01/05/13 0507 01/06/13 0433 01/07/13 0435  Weight: 158 lb 15.2 oz (72.1 kg) 162 lb 4.1 oz (73.6 kg) 159 lb 13.3 oz (72.5 kg)    Labs  CBC Lab Results  Component Value Date   WBC 5.8 12/31/2012   HGB 9.3* 12/31/2012   HCT 27.3* 12/31/2012   MCV 90.1 12/31/2012   PLT 333 12/31/2012    Basic Metabolic Panel  Recent Labs  01/05/13 0605 01/07/13 0339  NA 128* 128*  K 4.9 4.8  CL 90* 87*  CO2 28 27   GLUCOSE 124* 128*  BUN 29* 34*  CREATININE 1.34* 1.44*  CALCIUM 8.8 8.0*     Hemoglobin A1C Lab Results  Component Value Date   HGBA1C 6.5* 12/31/2012   Thyroid Function Tests Lab Results  Component Value Date   TSH 6.259* 12/31/2012   Disposition  Pt is being discharged home today in good condition.  Follow-up Plans & Appointments  Follow-up Information   Follow up with Antony Haste, MD On 01/23/2013. (3:00 PM)    Specialty:  Urology   Contact information:   [address] [facility]  [place] [address] [phone]       Follow up with Sherryl Manges, MD On 03/18/2013. (2:45 PM)    Specialty:  Cardiology   Contact information:   [address] [place] [address] [phone]       Follow up with Arvilla Meres, MD On 01/14/2013. (1:40 PM - [facility])    Specialty:  Cardiology   Contact information:   [address] [place] [address] [phone]      Discharge Medications    Medication List         acetaminophen 500 MG tablet  Commonly known as:  TYLENOL  Take 500 mg by mouth  every 6 (six) hours as needed for pain.     albuterol (2.5 MG/3ML) 0.083% nebulizer solution  Commonly known as:  PROVENTIL  Take 2.5 mg by nebulization every 6 (six) hours as needed for wheezing.     aspirin 81 MG tablet  Take 81 mg by mouth daily.     atorvastatin 40 MG tablet  Commonly known as:  LIPITOR  Take 40 mg by mouth every morning.     carvedilol 12.5 MG tablet  Commonly known as:  COREG  Take 0.5 tablets (6.25 mg total) by mouth 2 (two) times daily with a meal.     ciprofloxacin 500 MG tablet  Commonly known as:  CIPRO  Take 1 tablet (500 mg total) by mouth 2 (two) times daily.     diclofenac sodium 1 % Gel  Commonly known as:  VOLTAREN  Apply 2 g topically 4 (four) times daily.     feeding supplement (GLUCERNA SHAKE) Liqd  Take 237 mLs by mouth as  needed.     FERRAPLUS 90 90-1 MG Tabs  Take 1 tablet by mouth daily.     furosemide 80 MG tablet  Commonly known as:  LASIX  Take 1 tablet (80 mg total) by mouth 2 (two) times daily.     gabapentin 300 MG capsule  Commonly known as:  NEURONTIN  Take 1 capsule (300 mg total) by mouth 2 (two) times daily.     hydrOXYzine 10 MG tablet  Commonly known as:  ATARAX/VISTARIL  Take 1 tablet (10 mg total) by mouth 2 (two) times daily.     isosorbide mononitrate 60 MG 24 hr tablet  Commonly known as:  IMDUR  Take 60 mg by mouth daily at 12 noon.     losartan 50 MG tablet  Commonly known as:  COZAAR  Take 1 tablet (50 mg total) by mouth daily.     magnesium oxide 400 MG tablet  Commonly known as:  MAG-OX  Take 1 tablet (400 mg total) by mouth daily.     nitroGLYCERIN 0.4 MG SL tablet  Commonly known as:  NITROSTAT  Place 1 tablet (0.4 mg total) under the tongue every 5 (five) minutes x 3 doses as needed for chest pain.     omeprazole 40 MG capsule  Commonly known as:  PRILOSEC  Take 40 mg by mouth daily.     polyethylene glycol packet  Commonly known as:  MIRALAX / GLYCOLAX  Take 17 g by mouth daily as needed (for constipation).     SIMPLE DIAGNOSTICS LANCING DEV Misc     sitaGLIPtin-metformin 50-500 MG per tablet  Commonly known as:  JANUMET  Take 1 tablet by mouth 2 (two) times daily with a meal.     spironolactone 25 MG tablet  Commonly known as:  ALDACTONE  Take 1 tablet (25 mg total) by mouth daily.     tiotropium 18 MCG inhalation capsule  Commonly known as:  SPIRIVA  Place 18 mcg into inhaler and inhale daily.     traMADol 50 MG tablet  Commonly known as:  ULTRAM  Take 50-100 mg by mouth every 6 (six) hours as needed for moderate pain.     traZODone 100 MG tablet  Commonly known as:  DESYREL  Take 150 mg by mouth at bedtime.     venlafaxine XR 150 MG 24 hr capsule  Commonly known as:  EFFEXOR-XR  Take 1 capsule (150 mg total) by mouth every morning.  Outstanding Labs/Studies  BMET on Thursday 01/09/2012.  Duration of Discharge Encounter   Greater than 30 minutes including physician time.  Signed, Nicolasa Ducking NP 01/07/2013, 8:41 AM

## 2013-01-07 NOTE — Progress Notes (Signed)
Patient Name: Sumner BoastHattie B Marty Date of Encounter: 01/07/2013   Principal Problem:   Acute on chronic systolic CHF (congestive heart failure) Active Problems:   Type II or unspecified type diabetes mellitus with renal manifestations, uncontrolled(250.42)   Hyperlipidemia LDL goal < 70   HYPERTENSION   CORONARY ARTERY DISEASE   Moderate COPD (chronic obstructive pulmonary disease)   Iron deficiency anemia   Dementia   Difficulty voiding   Cardiomyopathy, ischemic   Physical deconditioning   CKD (chronic kidney disease), stage III    SUBJECTIVE  No dyspnea.  No chest pain  CURRENT MEDS . aspirin EC  81 mg Oral Daily  . atorvastatin  40 mg Oral q1800  . carvedilol  6.25 mg Oral BID WC  . enoxaparin (LOVENOX) injection  40 mg Subcutaneous Q24H  . folic acid-pyridoxine-cyancobalamin  1 tablet Oral Daily  . furosemide  80 mg Oral BID  . gabapentin  300 mg Oral BID  . hydrOXYzine  10 mg Oral BID  . insulin aspart  0-9 Units Subcutaneous TID WC  . isosorbide mononitrate  60 mg Oral Q1200  . losartan  50 mg Oral Daily  . magnesium oxide  400 mg Oral Daily  . pantoprazole  80 mg Oral Daily  . sodium chloride  3 mL Intravenous Q12H  . spironolactone  25 mg Oral Daily  . tiotropium  18 mcg Inhalation Daily  . traZODone  150 mg Oral QHS  . venlafaxine XR  150 mg Oral q morning - 10a    OBJECTIVE  Filed Vitals:   01/06/13 1100 01/06/13 1258 01/06/13 2039 01/07/13 0435  BP: 123/66  104/65 127/79  Pulse: 77  71 68  Temp: 98.5 F (36.9 C) 98.5 F (36.9 C) 97.3 F (36.3 C) 97.5 F (36.4 C)  TempSrc: Oral  Oral Oral  Resp: 18  18 16   Height:      Weight:    159 lb 13.3 oz (72.5 kg)  SpO2: 100%  98% 95%    Intake/Output Summary (Last 24 hours) at 01/07/13 0731 Last data filed at 01/07/13 0439  Gross per 24 hour  Intake    200 ml  Output   1800 ml  Net  -1600 ml   Filed Weights   01/05/13 0507 01/06/13 0433 01/07/13 0435  Weight: 158 lb 15.2 oz (72.1 kg) 162 lb  4.1 oz (73.6 kg) 159 lb 13.3 oz (72.5 kg)    PHYSICAL EXAM  General: Pleasant, NAD.   Neuro: Alert and oriented X 3. Moves all extremities spontaneously. HEENT:  Normal  Neck: Supple.  Neck veins to jaw. Lungs:  Resp regular and unlabored, diminished BS LLL Heart: RRR Abdomen: not tender, non-distended Extremities: No edema.   Accessory Clinical Findings  Basic Metabolic Panel  Recent Labs  01/05/13 0605 01/07/13 0339  NA 128* 128*  K 4.9 4.8  CL 90* 87*  CO2 28 27  GLUCOSE 124* 128*  BUN 29* 34*  CREATININE 1.34* 1.44*  CALCIUM 8.8 8.0*    TELE  A sensed, v paced, 1st deg avb.  Radiology/Studies  [place] Renal  01/03/2013   CLINICAL DATA:  Chronic renal failure  EXAM: RENAL/URINARY TRACT ULTRASOUND COMPLETE  COMPARISON:  None.  FINDINGS: Right Kidney:  Length: 9.5 cm in length. Slight increased echogenicity is noted. No mass lesion or hydronephrosis is seen.  Left Kidney:  Length: 9.9 cm in length. Increased echogenicity is noted. No mass lesion or hydronephrosis is seen.  Bladder:  Decompressed by Foley  catheter.  Bilateral pleural effusions are noted.  IMPRESSION: Bilateral pleural effusions.  Increased echogenicity consistent with medical renal disease.   Electronically Signed   By: Alcide Clever M.D.   On: 01/03/2013 15:20   ASSESSMENT AND PLAN  1.  Acute on chronic systolic chf/ICM: Improved; continue lasix 80mg  po bid.  Cont bb, arb, spiro, hydral/nitrate.  DC to SNF today.  2.  CKD III:  Stable.  3.  Severe Deconditioning:  SNF today.  4.  Difficulty Voiding:  Appreciate urology recs - outpt f/u with voiding trial.  She is to be discharged with a foley.  5. HTN:  Stable.  6.  DMII:  Cont ssi.  A1c 6.5 on 12/30.  On janumet at home - resume at d/c.  7. Klebsiella UTI: add ciprofloxacin for 7 days with indwelling foley. DC today and FU with Urology; BMET Friday with results to Dr Antoine Poche; FU Dr Antoine Poche one week; may benefit from CHF clinic. > 30 min PA and  physician time D2 Signed, Olga Millers MD

## 2013-01-07 NOTE — Progress Notes (Addendum)
Clinical Social Worker facilitated patient discharge by contacting the patient, patient's husband and facility, [facility].  Patient agreeable to this plan and arranging transport via EMS . CSW will sign off, as social work intervention is no longer needed.  Maree KrabbeLindsay Shaneil Yazdi, MSW, Theresia MajorsLCSWA [phone]

## 2013-01-07 NOTE — Discharge Summary (Signed)
See progress notes [name]  

## 2013-01-07 NOTE — Discharge Instructions (Signed)
***  PLEASE REMEMBER TO BRING ALL OF YOUR MEDICATIONS TO EACH OF YOUR FOLLOW-UP OFFICE VISITS.  

## 2013-01-10 ENCOUNTER — Telehealth: Payer: Self-pay | Admitting: Internal Medicine

## 2013-01-10 ENCOUNTER — Other Ambulatory Visit: Payer: Self-pay | Admitting: Internal Medicine

## 2013-01-10 DIAGNOSIS — N183 Chronic kidney disease, stage 3 unspecified: Secondary | ICD-10-CM

## 2013-01-10 DIAGNOSIS — R5381 Other malaise: Secondary | ICD-10-CM

## 2013-01-10 DIAGNOSIS — I5022 Chronic systolic (congestive) heart failure: Secondary | ICD-10-CM

## 2013-01-10 DIAGNOSIS — F0390 Unspecified dementia without behavioral disturbance: Secondary | ICD-10-CM

## 2013-01-10 DIAGNOSIS — E1129 Type 2 diabetes mellitus with other diabetic kidney complication: Secondary | ICD-10-CM

## 2013-01-10 DIAGNOSIS — J449 Chronic obstructive pulmonary disease, unspecified: Secondary | ICD-10-CM

## 2013-01-10 DIAGNOSIS — E1165 Type 2 diabetes mellitus with hyperglycemia: Secondary | ICD-10-CM

## 2013-01-10 NOTE — Telephone Encounter (Signed)
Pt checked herself out of [facility].  They kept all of her medicine since she checked out early.  She was on an antibiotic for a uti.  She wants all of her medicines called in [facility] [address]

## 2013-01-10 NOTE — Telephone Encounter (Signed)
Pt is aware.  

## 2013-01-10 NOTE — Telephone Encounter (Signed)
done

## 2013-01-10 NOTE — Telephone Encounter (Signed)
LMOM to call her pharmacy. °

## 2013-01-10 NOTE — Telephone Encounter (Signed)
Pt is requesting [facility] to come in for an eval for several services.  She needs help with her medicines and personal care.

## 2013-01-10 NOTE — Telephone Encounter (Signed)
ok 

## 2013-01-14 ENCOUNTER — Inpatient Hospital Stay (HOSPITAL_COMMUNITY): Admit: 2013-01-14

## 2013-01-16 ENCOUNTER — Telehealth: Payer: Self-pay | Admitting: *Deleted

## 2013-01-16 ENCOUNTER — Other Ambulatory Visit: Payer: Self-pay | Admitting: *Deleted

## 2013-01-16 DIAGNOSIS — E1165 Type 2 diabetes mellitus with hyperglycemia: Principal | ICD-10-CM

## 2013-01-16 DIAGNOSIS — E1129 Type 2 diabetes mellitus with other diabetic kidney complication: Secondary | ICD-10-CM

## 2013-01-16 MED ORDER — SIMPLE DIAGNOSTICS LANCING DEV MISC: 1.0000 | Freq: Three times a day (TID) | Status: DC

## 2013-01-16 NOTE — Telephone Encounter (Signed)
[name], with [facility] phoned requesting refills for patient's diabetic supplies.. Last OV with PCP 10/30/12.  No ordered frequency for lancets available.  Please advise.  CB# [phone]

## 2013-01-16 NOTE — Telephone Encounter (Signed)
[facility]

## 2013-01-16 NOTE — Telephone Encounter (Signed)
Which pharmacy?

## 2013-01-17 NOTE — Telephone Encounter (Signed)
The status of this encounter shows as closed.  Is there any further action needed on my end?  Please advise.

## 2013-01-18 ENCOUNTER — Other Ambulatory Visit: Payer: Self-pay | Admitting: Cardiology

## 2013-01-20 ENCOUNTER — Other Ambulatory Visit: Payer: Self-pay | Admitting: Pulmonary Disease

## 2013-01-20 MED ORDER — SIMPLE DIAGNOSTICS LANCING DEV MISC: 1.0000 | Freq: Three times a day (TID) | Status: DC

## 2013-01-20 NOTE — Telephone Encounter (Signed)
Britta MccreedyBarbara, pharmacy tech with [facility], phoned to clarify lancet order which had been previously submitted to local pharmacy instead.  Re-submitted to corrected pharmacy.

## 2013-01-24 ENCOUNTER — Emergency Department (HOSPITAL_COMMUNITY): Payer: Medicare Other

## 2013-01-24 ENCOUNTER — Inpatient Hospital Stay (HOSPITAL_COMMUNITY)
Admission: EM | Admit: 2013-01-24 | Discharge: 2013-01-27 | DRG: 091 | Disposition: A | Discharge status: Skilled Nursing Facility | Payer: Medicare Other | Attending: Family Medicine | Admitting: Family Medicine

## 2013-01-24 ENCOUNTER — Encounter (HOSPITAL_COMMUNITY): Payer: Self-pay | Admitting: Emergency Medicine

## 2013-01-24 DIAGNOSIS — E43 Unspecified severe protein-calorie malnutrition: Secondary | ICD-10-CM | POA: Diagnosis present

## 2013-01-24 DIAGNOSIS — I252 Old myocardial infarction: Secondary | ICD-10-CM

## 2013-01-24 DIAGNOSIS — E119 Type 2 diabetes mellitus without complications: Secondary | ICD-10-CM | POA: Diagnosis present

## 2013-01-24 DIAGNOSIS — E875 Hyperkalemia: Secondary | ICD-10-CM | POA: Diagnosis present

## 2013-01-24 DIAGNOSIS — I509 Heart failure, unspecified: Secondary | ICD-10-CM | POA: Diagnosis present

## 2013-01-24 DIAGNOSIS — N184 Chronic kidney disease, stage 4 (severe): Secondary | ICD-10-CM | POA: Diagnosis present

## 2013-01-24 DIAGNOSIS — M549 Dorsalgia, unspecified: Secondary | ICD-10-CM | POA: Diagnosis present

## 2013-01-24 DIAGNOSIS — E785 Hyperlipidemia, unspecified: Secondary | ICD-10-CM | POA: Diagnosis present

## 2013-01-24 DIAGNOSIS — Z515 Encounter for palliative care: Secondary | ICD-10-CM

## 2013-01-24 DIAGNOSIS — I255 Ischemic cardiomyopathy: Secondary | ICD-10-CM

## 2013-01-24 DIAGNOSIS — N179 Acute kidney failure, unspecified: Secondary | ICD-10-CM | POA: Diagnosis present

## 2013-01-24 DIAGNOSIS — Z9981 Dependence on supplemental oxygen: Secondary | ICD-10-CM

## 2013-01-24 DIAGNOSIS — I130 Hypertensive heart and chronic kidney disease with heart failure and stage 1 through stage 4 chronic kidney disease, or unspecified chronic kidney disease: Secondary | ICD-10-CM | POA: Diagnosis present

## 2013-01-24 DIAGNOSIS — W19XXXA Unspecified fall, initial encounter: Secondary | ICD-10-CM | POA: Diagnosis present

## 2013-01-24 DIAGNOSIS — R64 Cachexia: Secondary | ICD-10-CM | POA: Diagnosis present

## 2013-01-24 DIAGNOSIS — Z95810 Presence of automatic (implantable) cardiac defibrillator: Secondary | ICD-10-CM

## 2013-01-24 DIAGNOSIS — K7689 Other specified diseases of liver: Secondary | ICD-10-CM | POA: Diagnosis present

## 2013-01-24 DIAGNOSIS — F0390 Unspecified dementia without behavioral disturbance: Secondary | ICD-10-CM | POA: Diagnosis present

## 2013-01-24 DIAGNOSIS — Z79899 Other long term (current) drug therapy: Secondary | ICD-10-CM

## 2013-01-24 DIAGNOSIS — F17200 Nicotine dependence, unspecified, uncomplicated: Secondary | ICD-10-CM | POA: Diagnosis present

## 2013-01-24 DIAGNOSIS — Z800 Family history of malignant neoplasm of digestive organs: Secondary | ICD-10-CM

## 2013-01-24 DIAGNOSIS — N183 Chronic kidney disease, stage 3 unspecified: Secondary | ICD-10-CM

## 2013-01-24 DIAGNOSIS — E872 Acidosis, unspecified: Secondary | ICD-10-CM | POA: Diagnosis present

## 2013-01-24 DIAGNOSIS — Y92009 Unspecified place in unspecified non-institutional (private) residence as the place of occurrence of the external cause: Secondary | ICD-10-CM

## 2013-01-24 DIAGNOSIS — I5023 Acute on chronic systolic (congestive) heart failure: Secondary | ICD-10-CM

## 2013-01-24 DIAGNOSIS — E46 Unspecified protein-calorie malnutrition: Secondary | ICD-10-CM | POA: Diagnosis present

## 2013-01-24 DIAGNOSIS — R627 Adult failure to thrive: Secondary | ICD-10-CM | POA: Diagnosis present

## 2013-01-24 DIAGNOSIS — Z823 Family history of stroke: Secondary | ICD-10-CM

## 2013-01-24 DIAGNOSIS — G92 Toxic encephalopathy: Principal | ICD-10-CM | POA: Diagnosis present

## 2013-01-24 DIAGNOSIS — K219 Gastro-esophageal reflux disease without esophagitis: Secondary | ICD-10-CM | POA: Diagnosis present

## 2013-01-24 DIAGNOSIS — G8929 Other chronic pain: Secondary | ICD-10-CM | POA: Diagnosis present

## 2013-01-24 DIAGNOSIS — Z803 Family history of malignant neoplasm of breast: Secondary | ICD-10-CM

## 2013-01-24 DIAGNOSIS — I5022 Chronic systolic (congestive) heart failure: Secondary | ICD-10-CM | POA: Diagnosis present

## 2013-01-24 DIAGNOSIS — D509 Iron deficiency anemia, unspecified: Secondary | ICD-10-CM | POA: Diagnosis present

## 2013-01-24 DIAGNOSIS — I2589 Other forms of chronic ischemic heart disease: Secondary | ICD-10-CM | POA: Diagnosis present

## 2013-01-24 DIAGNOSIS — J449 Chronic obstructive pulmonary disease, unspecified: Secondary | ICD-10-CM | POA: Diagnosis present

## 2013-01-24 DIAGNOSIS — I2510 Atherosclerotic heart disease of native coronary artery without angina pectoris: Secondary | ICD-10-CM | POA: Diagnosis present

## 2013-01-24 DIAGNOSIS — Z7982 Long term (current) use of aspirin: Secondary | ICD-10-CM

## 2013-01-24 DIAGNOSIS — I1310 Hypertensive heart and chronic kidney disease without heart failure, with stage 1 through stage 4 chronic kidney disease, or unspecified chronic kidney disease: Secondary | ICD-10-CM

## 2013-01-24 DIAGNOSIS — G4733 Obstructive sleep apnea (adult) (pediatric): Secondary | ICD-10-CM | POA: Diagnosis present

## 2013-01-24 DIAGNOSIS — R5381 Other malaise: Secondary | ICD-10-CM

## 2013-01-24 DIAGNOSIS — R0789 Other chest pain: Secondary | ICD-10-CM

## 2013-01-24 DIAGNOSIS — G9340 Encephalopathy, unspecified: Secondary | ICD-10-CM

## 2013-01-24 DIAGNOSIS — J9600 Acute respiratory failure, unspecified whether with hypoxia or hypercapnia: Secondary | ICD-10-CM | POA: Diagnosis present

## 2013-01-24 DIAGNOSIS — R4182 Altered mental status, unspecified: Secondary | ICD-10-CM

## 2013-01-24 DIAGNOSIS — I5043 Acute on chronic combined systolic (congestive) and diastolic (congestive) heart failure: Secondary | ICD-10-CM | POA: Diagnosis present

## 2013-01-24 DIAGNOSIS — J4489 Other specified chronic obstructive pulmonary disease: Secondary | ICD-10-CM | POA: Diagnosis present

## 2013-01-24 DIAGNOSIS — G929 Unspecified toxic encephalopathy: Principal | ICD-10-CM | POA: Diagnosis present

## 2013-01-24 DIAGNOSIS — A419 Sepsis, unspecified organism: Secondary | ICD-10-CM | POA: Diagnosis present

## 2013-01-24 DIAGNOSIS — R339 Retention of urine, unspecified: Secondary | ICD-10-CM | POA: Diagnosis present

## 2013-01-24 DIAGNOSIS — I2789 Other specified pulmonary heart diseases: Secondary | ICD-10-CM | POA: Diagnosis present

## 2013-01-24 DIAGNOSIS — R630 Anorexia: Secondary | ICD-10-CM | POA: Diagnosis present

## 2013-01-24 DIAGNOSIS — N19 Unspecified kidney failure: Secondary | ICD-10-CM

## 2013-01-24 DIAGNOSIS — I440 Atrioventricular block, first degree: Secondary | ICD-10-CM

## 2013-01-24 DIAGNOSIS — Z950 Presence of cardiac pacemaker: Secondary | ICD-10-CM | POA: Diagnosis present

## 2013-01-24 DIAGNOSIS — F411 Generalized anxiety disorder: Secondary | ICD-10-CM | POA: Diagnosis present

## 2013-01-24 DIAGNOSIS — Z66 Do not resuscitate: Secondary | ICD-10-CM | POA: Diagnosis present

## 2013-01-24 DIAGNOSIS — I10 Essential (primary) hypertension: Secondary | ICD-10-CM | POA: Diagnosis present

## 2013-01-24 DIAGNOSIS — N189 Chronic kidney disease, unspecified: Secondary | ICD-10-CM

## 2013-01-24 LAB — CBC WITH DIFFERENTIAL/PLATELET
BASOS ABS: 0 10*3/uL (ref 0.0–0.1)
Basophils Relative: 0 % (ref 0–1)
Eosinophils Absolute: 0 10*3/uL (ref 0.0–0.7)
Eosinophils Relative: 0 % (ref 0–5)
HCT: 27.2 % — ABNORMAL LOW (ref 36.0–46.0)
Hemoglobin: 9.2 g/dL — ABNORMAL LOW (ref 12.0–15.0)
LYMPHS ABS: 0.6 10*3/uL — AB (ref 0.7–4.0)
Lymphocytes Relative: 12 % (ref 12–46)
MCH: 30.8 pg (ref 26.0–34.0)
MCHC: 33.8 g/dL (ref 30.0–36.0)
MCV: 91 fL (ref 78.0–100.0)
MONOS PCT: 3 % (ref 3–12)
Monocytes Absolute: 0.1 10*3/uL (ref 0.1–1.0)
Neutro Abs: 4.2 10*3/uL (ref 1.7–7.7)
Neutrophils Relative %: 85 % — ABNORMAL HIGH (ref 43–77)
Platelets: 260 10*3/uL (ref 150–400)
RBC: 2.99 MIL/uL — ABNORMAL LOW (ref 3.87–5.11)
RDW: 21.3 % — AB (ref 11.5–15.5)
WBC: 4.9 10*3/uL (ref 4.0–10.5)

## 2013-01-24 LAB — BASIC METABOLIC PANEL
BUN: 41 mg/dL — AB (ref 6–23)
CHLORIDE: 90 meq/L — AB (ref 96–112)
CO2: 22 meq/L (ref 19–32)
CREATININE: 1.94 mg/dL — AB (ref 0.50–1.10)
Calcium: 8.7 mg/dL (ref 8.4–10.5)
GFR calc Af Amer: 29 mL/min — ABNORMAL LOW (ref >90)
GFR calc non Af Amer: 25 mL/min — ABNORMAL LOW (ref >90)
Glucose, Bld: 88 mg/dL (ref 70–99)
Potassium: 5.3 mEq/L (ref 3.7–5.3)
Sodium: 130 mEq/L — ABNORMAL LOW (ref 137–147)

## 2013-01-24 LAB — URINALYSIS, ROUTINE W REFLEX MICROSCOPIC
BILIRUBIN URINE: NEGATIVE
Glucose, UA: NEGATIVE mg/dL
HGB URINE DIPSTICK: NEGATIVE
KETONES UR: NEGATIVE mg/dL
Leukocytes, UA: NEGATIVE
NITRITE: NEGATIVE
Protein, ur: NEGATIVE mg/dL
SPECIFIC GRAVITY, URINE: 1.016 (ref 1.005–1.030)
UROBILINOGEN UA: 0.2 mg/dL (ref 0.0–1.0)
pH: 5.5 (ref 5.0–8.0)

## 2013-01-24 LAB — POCT I-STAT 3, ART BLOOD GAS (G3+)
Acid-base deficit: 1 mmol/L (ref 0.0–2.0)
BICARBONATE: 23.5 meq/L (ref 20.0–24.0)
O2 Saturation: 97 %
PH ART: 7.391 (ref 7.350–7.450)
TCO2: 25 mmol/L (ref 0–100)
pCO2 arterial: 38.7 mmHg (ref 35.0–45.0)
pO2, Arterial: 86 mmHg (ref 80.0–100.0)

## 2013-01-24 LAB — PROCALCITONIN: Procalcitonin: 0.1 ng/mL

## 2013-01-24 LAB — COMPREHENSIVE METABOLIC PANEL
ALK PHOS: 103 U/L (ref 39–117)
ALT: 23 U/L (ref 0–35)
AST: 36 U/L (ref 0–37)
Albumin: 2.8 g/dL — ABNORMAL LOW (ref 3.5–5.2)
BILIRUBIN TOTAL: 0.7 mg/dL (ref 0.3–1.2)
BUN: 41 mg/dL — AB (ref 6–23)
CO2: 21 mEq/L (ref 19–32)
CREATININE: 2 mg/dL — AB (ref 0.50–1.10)
Calcium: 9 mg/dL (ref 8.4–10.5)
Chloride: 90 mEq/L — ABNORMAL LOW (ref 96–112)
GFR calc non Af Amer: 24 mL/min — ABNORMAL LOW (ref >90)
GFR, EST AFRICAN AMERICAN: 28 mL/min — AB (ref >90)
GLUCOSE: 99 mg/dL (ref 70–99)
POTASSIUM: 5.9 meq/L — AB (ref 3.7–5.3)
Sodium: 132 mEq/L — ABNORMAL LOW (ref 137–147)
TOTAL PROTEIN: 6.5 g/dL (ref 6.0–8.3)

## 2013-01-24 LAB — MRSA PCR SCREENING: MRSA by PCR: NEGATIVE

## 2013-01-24 LAB — CBC
HEMATOCRIT: 28 % — AB (ref 36.0–46.0)
Hemoglobin: 9.5 g/dL — ABNORMAL LOW (ref 12.0–15.0)
MCH: 30.7 pg (ref 26.0–34.0)
MCHC: 33.9 g/dL (ref 30.0–36.0)
MCV: 90.6 fL (ref 78.0–100.0)
PLATELETS: 261 10*3/uL (ref 150–400)
RBC: 3.09 MIL/uL — AB (ref 3.87–5.11)
RDW: 21.5 % — ABNORMAL HIGH (ref 11.5–15.5)
WBC: 5.4 10*3/uL (ref 4.0–10.5)

## 2013-01-24 LAB — AMMONIA
AMMONIA: 17 umol/L (ref 11–60)
Ammonia: 30 umol/L (ref 11–60)

## 2013-01-24 LAB — GLUCOSE, CAPILLARY
Glucose-Capillary: 89 mg/dL (ref 70–99)
Glucose-Capillary: 85 mg/dL (ref 70–99)

## 2013-01-24 LAB — POCT I-STAT, CHEM 8
BUN: 38 mg/dL — AB (ref 6–23)
CALCIUM ION: 1.16 mmol/L (ref 1.13–1.30)
Chloride: 92 mEq/L — ABNORMAL LOW (ref 96–112)
Creatinine, Ser: 2.1 mg/dL — ABNORMAL HIGH (ref 0.50–1.10)
Glucose, Bld: 99 mg/dL (ref 70–99)
HCT: 31 % — ABNORMAL LOW (ref 36.0–46.0)
HEMOGLOBIN: 10.5 g/dL — AB (ref 12.0–15.0)
Potassium: 5.5 mEq/L — ABNORMAL HIGH (ref 3.7–5.3)
Sodium: 127 mEq/L — ABNORMAL LOW (ref 137–147)
TCO2: 24 mmol/L (ref 0–100)

## 2013-01-24 LAB — STREP PNEUMONIAE URINARY ANTIGEN: STREP PNEUMO URINARY ANTIGEN: NEGATIVE

## 2013-01-24 LAB — POCT I-STAT TROPONIN I: Troponin i, poc: 0.03 ng/mL (ref 0.00–0.08)

## 2013-01-24 LAB — PROTIME-INR
INR: 1.62 — ABNORMAL HIGH (ref 0.00–1.49)
Prothrombin Time: 18.8 seconds — ABNORMAL HIGH (ref 11.6–15.2)

## 2013-01-24 LAB — CG4 I-STAT (LACTIC ACID): Lactic Acid, Venous: 3.47 mmol/L — ABNORMAL HIGH (ref 0.5–2.2)

## 2013-01-24 LAB — PRO B NATRIURETIC PEPTIDE: Pro B Natriuretic peptide (BNP): 62827 pg/mL — ABNORMAL HIGH (ref 0–125)

## 2013-01-24 MED ORDER — FUROSEMIDE 10 MG/ML IJ SOLN
20.0000 mg | Freq: Once | INTRAMUSCULAR | Status: AC
  Administered 2013-01-24: 20 mg via INTRAVENOUS
  Filled 2013-01-24: qty 2

## 2013-01-24 MED ORDER — ASPIRIN EC 81 MG PO TBEC
81.0000 mg | DELAYED_RELEASE_TABLET | Freq: Every day | ORAL | Status: DC
  Administered 2013-01-25 – 2013-01-26 (×2): 81 mg via ORAL
  Filled 2013-01-24 (×3): qty 1

## 2013-01-24 MED ORDER — METOPROLOL TARTRATE 1 MG/ML IV SOLN
5.0000 mg | Freq: Once | INTRAVENOUS | Status: AC
  Administered 2013-01-24: 5 mg via INTRAVENOUS
  Filled 2013-01-24: qty 5

## 2013-01-24 MED ORDER — ALBUTEROL SULFATE (2.5 MG/3ML) 0.083% IN NEBU: 2.5000 mg | INHALATION_SOLUTION | RESPIRATORY_TRACT | Status: DC | PRN

## 2013-01-24 MED ORDER — STERILE WATER FOR INJECTION IV SOLN
INTRAVENOUS | Status: DC
  Filled 2013-01-24: qty 850

## 2013-01-24 MED ORDER — TIOTROPIUM BROMIDE MONOHYDRATE 18 MCG IN CAPS
18.0000 ug | ORAL_CAPSULE | Freq: Every day | RESPIRATORY_TRACT | Status: DC
  Filled 2013-01-24: qty 5

## 2013-01-24 MED ORDER — SODIUM CHLORIDE 0.9 % IJ SOLN
3.0000 mL | Freq: Two times a day (BID) | INTRAMUSCULAR | Status: DC
  Administered 2013-01-24 – 2013-01-27 (×6): 3 mL via INTRAVENOUS

## 2013-01-24 MED ORDER — CARVEDILOL 6.25 MG PO TABS
6.2500 mg | ORAL_TABLET | Freq: Two times a day (BID) | ORAL | Status: DC
  Administered 2013-01-25 – 2013-01-27 (×5): 6.25 mg via ORAL
  Filled 2013-01-24 (×7): qty 1

## 2013-01-24 MED ORDER — SODIUM POLYSTYRENE SULFONATE 15 GM/60ML PO SUSP
30.0000 g | Freq: Once | ORAL | Status: DC
  Filled 2013-01-24: qty 120

## 2013-01-24 MED ORDER — TIOTROPIUM BROMIDE MONOHYDRATE 18 MCG IN CAPS: 18.0000 ug | ORAL_CAPSULE | Freq: Every day | RESPIRATORY_TRACT | Status: DC

## 2013-01-24 MED ORDER — CEFEPIME HCL 1 G IJ SOLR: 1.0000 g | INTRAMUSCULAR | Status: DC

## 2013-01-24 MED ORDER — VANCOMYCIN HCL IN DEXTROSE 750-5 MG/150ML-% IV SOLN: 750.0000 mg | INTRAVENOUS | Status: DC

## 2013-01-24 MED ORDER — ALBUTEROL SULFATE (2.5 MG/3ML) 0.083% IN NEBU
2.5000 mg | INHALATION_SOLUTION | Freq: Two times a day (BID) | RESPIRATORY_TRACT | Status: DC
  Administered 2013-01-25 – 2013-01-27 (×5): 2.5 mg via RESPIRATORY_TRACT
  Filled 2013-01-24 (×4): qty 3

## 2013-01-24 MED ORDER — DEXTROSE 5 % IV SOLN
1.0000 g | Freq: Once | INTRAVENOUS | Status: AC
  Administered 2013-01-24: 1 g via INTRAVENOUS
  Filled 2013-01-24: qty 1

## 2013-01-24 MED ORDER — SODIUM POLYSTYRENE SULFONATE 15 GM/60ML PO SUSP
30.0000 g | Freq: Once | ORAL | Status: DC
Start: 2013-01-24 — End: 2013-01-25
  Filled 2013-01-24: qty 120

## 2013-01-24 MED ORDER — FUROSEMIDE 10 MG/ML IJ SOLN
80.0000 mg | Freq: Once | INTRAMUSCULAR | Status: AC
  Administered 2013-01-24: 80 mg via INTRAVENOUS
  Filled 2013-01-24: qty 8

## 2013-01-24 MED ORDER — VANCOMYCIN HCL IN DEXTROSE 750-5 MG/150ML-% IV SOLN
750.0000 mg | Freq: Once | INTRAVENOUS | Status: AC
  Administered 2013-01-24: 750 mg via INTRAVENOUS
  Filled 2013-01-24: qty 150

## 2013-01-24 MED ORDER — HEPARIN SODIUM (PORCINE) 5000 UNIT/ML IJ SOLN
5000.0000 [IU] | Freq: Three times a day (TID) | INTRAMUSCULAR | Status: DC
  Administered 2013-01-24 – 2013-01-27 (×8): 5000 [IU] via SUBCUTANEOUS
  Filled 2013-01-24 (×11): qty 1

## 2013-01-24 MED ORDER — ISOSORBIDE MONONITRATE ER 60 MG PO TB24
60.0000 mg | ORAL_TABLET | Freq: Every day | ORAL | Status: DC
  Administered 2013-01-25 – 2013-01-27 (×3): 60 mg via ORAL
  Filled 2013-01-24 (×3): qty 1

## 2013-01-24 MED ORDER — TIOTROPIUM BROMIDE MONOHYDRATE 18 MCG IN CAPS
18.0000 ug | ORAL_CAPSULE | Freq: Every day | RESPIRATORY_TRACT | Status: DC
  Administered 2013-01-25 – 2013-01-27 (×2): 18 ug via RESPIRATORY_TRACT
  Filled 2013-01-24: qty 5

## 2013-01-24 MED ORDER — SODIUM POLYSTYRENE SULFONATE 15 GM/60ML PO SUSP
30.0000 g | Freq: Once | ORAL | Status: AC
  Administered 2013-01-24: 30 g via RECTAL
  Filled 2013-01-24: qty 120

## 2013-01-24 MED ORDER — IPRATROPIUM-ALBUTEROL 0.5-2.5 (3) MG/3ML IN SOLN: 3.0000 mL | Freq: Two times a day (BID) | RESPIRATORY_TRACT | Status: DC

## 2013-01-24 MED ORDER — IPRATROPIUM-ALBUTEROL 0.5-2.5 (3) MG/3ML IN SOLN
3.0000 mL | RESPIRATORY_TRACT | Status: DC
Start: 2013-01-24 — End: 2013-01-24
  Administered 2013-01-24 (×2): 3 mL via RESPIRATORY_TRACT
  Filled 2013-01-24 (×2): qty 3

## 2013-01-24 NOTE — Progress Notes (Signed)
ANTIBIOTIC CONSULT NOTE - INITIAL  Pharmacy Consult for Vancomycin, Cefepime  Indication: Sepsis  Allergies  Allergen Reactions  . Amlodipine Other (See Comments)    constipation  . Ramipril Cough  . Adhesive [Tape] Hives    "peels skin off"  . Morphine And Related Other (See Comments)    Per Husband, Pt has hallucinations  . Penicillins Itching  . Codeine Rash    Patient Measurements:   Adjusted Body Weight: n/a  Vital Signs: Temp: 96.2 F (35.7 C) (01/23 1438) Temp src: Rectal (01/23 1423) BP: 136/74 mmHg (01/23 1500) Pulse Rate: 69 (01/23 1500) Intake/Output from previous day:   Intake/Output from this shift:    Labs:  Recent Labs  01/24/13 1213 01/24/13 1249 01/24/13 1315  WBC 4.9  --   --   HGB 9.2*  --  10.5*  PLT 260  --   --   CREATININE  --  2.00* 2.10*   The CrCl is unknown because both a height and weight (above a minimum accepted value) are required for this calculation. No results found for this basename: VANCOTROUGH, Leodis Binet, VANCORANDOM, GENTTROUGH, GENTPEAK, GENTRANDOM, TOBRATROUGH, TOBRAPEAK, TOBRARND, AMIKACINPEAK, AMIKACINTROU, AMIKACIN,  in the last 72 hours   Microbiology: Recent Results (from the past 720 hour(s))  URINE CULTURE     Status: None   Collection Time    01/04/13 12:00 PM      Result Value Range Status   Specimen Description URINE, RANDOM   Final   Special Requests NONE   Final   Culture  Setup Time     Final   Value: 01/04/2013 20:56     Performed at [facility] Count     Final   Value: >=100,000 COLONIES/ML     Performed at [facility]   Culture     Final   Value: KLEBSIELLA PNEUMONIAE     Performed at [facility]   Report Status 01/06/2013 FINAL   Final   Organism ID, Bacteria KLEBSIELLA PNEUMONIAE   Final    Medical History: Past Medical History  Diagnosis Date  . DYSLIPIDEMIA   . OBESITY NOS   . DEPRESSION, RECURRENT, IN PARTIAL REMISSION   . HYPERTENSION   .  CORONARY ARTERY DISEASE     a. s/p Q wave MI 2002 with RCA stent x 2. b. Abnl nuc 04/2012 with subsequent cath showing patent stents in RCA, chronic diffuse disease in dLAD, 20% LM, EF 20%, no focal targets for PCI.  [name] GERD   . DIVERTICULOSIS, COLON, WITH HEMORRHAGE   . BACK PAIN, LUMBAR, WITH RADICULOPATHY   . OBSTRUCTIVE SLEEP APNEA   . Anemia, iron deficiency   . Hyponatremia   . Tobacco abuse   . Moderate COPD (chronic obstructive pulmonary disease) 11/07/2005    Spirometry 03/09/11>>FEV1 2.02 (88%), FEV1% 76    . Anxiety   . Dementia     a. Saw neuro 08/2012: concerning for early dementia, labs unremarkable except elev CRP.  [name] Chronic edema   . Weight loss     CT abd pelvis 05/2012: mild small bowel disention. No significant acute changes. Mod-severe gastritis by EGD 06/06/2012 no clear neoplastic changes. Neg small bowel follow through except for diverticulum on 06/11/12.   . Gastritis     a. by EGD 05/2012.  [name] Chronic systolic CHF (congestive heart failure)     a. Last EF 20% by cath 04/29/2012, 40-45% by echo 04/24/12.;  b.  Echocardiogram  (11/21/12): EF 10-15%,  severe diffuse HK, moderate MR, moderate LAE, mild RVE, moderately reduced RVSF, moderate to severe RAE, moderate TR, PASP 56  . Ischemic cardiomyopathy   . Syncope     a. 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 BB discontinued. b. Recurrence in 11/2012.=> Life Vest placed pending ICD placmement   . 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 12/2012 SJM BiV ICD.  [name]. Myocardial infarction 2002  . COPD (chronic obstructive pulmonary disease)   . Pneumonia ?2012  . Shortness of breath     "bad; most of the time" (12/31/2012)  . Type II diabetes mellitus   . Arthritis     "all over" (12/31/2012)  . Mid back pain, chronic     Medications:   (Not in a hospital admission) Assessment: 89 YOF [facility]. PMH significant for CAD s/p STEMI, CHF, Urinary retention, Stage  3-4 COPD on home O2, DM, HTN. Pharmacy to start antibiotics for possible sepsis (recent UTI). WBC wnl, Temp 96.2, CrCl ~ 27 mL/min   Cultures  1/23 Blood Cx x2>> 1/23 Urine Cx >>  Goal of Therapy:  Vancomycin trough level 15-20 mcg/ml  Plan:  1) Vancomycin 750 mg IV Q 24 hours  2) Start Cefepime 1 gm IV Q 24 hours 3) Monitor CBC, renal fx, and patient clinical status 4) Collect Vanc trough as necessary.   Vinnie LevelBenjamin Jermy Couper, PharmD.  Clinical Pharmacist Pager [phone]

## 2013-01-24 NOTE — Significant Event (Addendum)
Husband tells me that since she left the NH she got weaker and got fluid retention and was falling down. She was taking all her meds also. Usual weight 147 lbs. Not really eating since even before PPM.  Her baseline is "she can't do anything" She used to be able to walk a little. She needed to use a potty chair and a transport chair at home-has been doing this before PPM-she was on Hospice before for HF and her doctor, Dr. Sanda Lingerhomas Jones recommended Hospice but this was rescinded mid Dec after PPM  Her tells me that she wanted FULL CODE, but this was "some time"  Would want PEG  Agreeable to Palliative Consult to determine GOC Discussed c nephrology Dr. Melvyn Novasolodanato-lasix 80 iv now anfd then BID.  Hold bicarb.  Reassess fluid status am.  Poor dialysis candidate.   Await input from Cardiology-appreciate input in advance  Pleas KochJai Jerardo Costabile, MD [facility] [phone]

## 2013-01-24 NOTE — H&P (Addendum)
[facility] History and Physical  [name] ZOX:[id number] DOB: [birth year] DOA: 01/24/2013  Referring physician: Laveda Norman, PA ED PCP: [name] Linger, MD  Specialists: CCM consuslted  Chief Complaint: Encephalopathy   HPI: [name] is a 68 y.o. female came to [facility] ed 01/24/2013 with altered mental status.  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 No family is available at the bedside therefore I am not able to get a complete history but from what I am able to gather, patient has had a recent hospitalization and was sent to a nursing home and left there apparently AGAINST MEDICAL ADVICE-patient's husband allegedly took her out of the nursing home 01/12/12 after 4 days of her being there and patient made a progressive decline at home not eating not drinking not ambulatory and when was ambulatory had 2 falls on the second fall was the reason why patient was brought here Patient came progressively weaker and subsequently was brought over to the emergency room by the husband as he found her more decompensated than usual and brought her here. She is at bedside able to tell me that she is at [facility] and conjunctivae are but falls asleep after I arouse her. She's not able really to cooperate with the exam and am not able to get a good review of systems Was placed on 2 L nasal cannula when I take her off of that she goes down to the 80 percentile PaO2     Review of Systems:  Past Medical History  Diagnosis Date  . DYSLIPIDEMIA   . OBESITY NOS   . DEPRESSION, RECURRENT, IN PARTIAL REMISSION   . HYPERTENSION   . CORONARY ARTERY DISEASE      a. s/p Q wave MI 2002 with RCA stent x 2. b. Abnl nuc 04/2012 with subsequent cath showing patent stents in RCA, chronic diffuse disease in dLAD, 20% LM, EF 20%, no focal targets for PCI.  [name] GERD   . DIVERTICULOSIS, COLON, WITH HEMORRHAGE   . BACK PAIN, LUMBAR, WITH RADICULOPATHY   . OBSTRUCTIVE SLEEP APNEA   . Anemia, iron deficiency   . Hyponatremia   . Tobacco abuse   . Moderate COPD (chronic obstructive pulmonary disease) 11/07/2005    Spirometry 03/09/11>>FEV1 2.02 (88%), FEV1% 76    . Anxiety   . Dementia     a. Saw neuro 08/2012: concerning for early dementia, labs unremarkable except elev CRP.  [name] Chronic edema   . Weight loss     CT abd pelvis 05/2012: mild small bowel disention. No significant acute changes. Mod-severe gastritis by EGD 06/06/2012 no clear neoplastic changes. Neg small bowel follow through except for diverticulum on 06/11/12.   . Gastritis     a. by EGD 05/2012.  [name] Chronic systolic CHF (congestive heart failure)     a. Last EF 20% by cath 04/29/2012, 40-45% by echo 04/24/12.;  b.  Echocardiogram  (11/21/12): EF 10-15%, severe diffuse HK, moderate MR, moderate LAE, mild RVE, moderately reduced RVSF, moderate to severe RAE, moderate TR, PASP 56  . Ischemic cardiomyopathy   . Syncope     a. 04/2012: 2:1 heart block and 2nd degree  AVB/Wenckebach. BB discontinued. b. Recurrence in 11/2012.=> Life Vest placed pending ICD placmement   . 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 12/2012 SJM BiV ICD.  [name] Myocardial infarction 2002  . COPD (chronic obstructive pulmonary disease)   . Pneumonia ?2012  . Shortness of breath     "bad; most of the time" (12/31/2012)  . Type II diabetes mellitus   . Arthritis     "all over" (12/31/2012)  . Mid back pain, chronic    Past Surgical History  Procedure Laterality Date  . Uvuloplasty    . Total abdominal hysterectomy w/ bilateral salpingoophorectomy    . Cholecystectomy  2004  .  Esophagogastroduodenoscopy (egd) with propofol N/A 06/06/2012    Procedure: ESOPHAGOGASTRODUODENOSCOPY (EGD) WITH PROPOFOL;  Surgeon: Rachael Fee, MD;  Location: [facility] ENDOSCOPY;  Service: Endoscopy;  Laterality: N/A;  . Cardiac defibrillator placement  12/16/2012    dual chamber  DR Graciela Husbands  . Tonsillectomy    . Appendectomy    . Total abdominal hysterectomy    . Carpal tunnel release Left   . Refractive surgery Bilateral   . Coronary angioplasty with stent placement  2002    Thrombectomy; prox RCA 3.5 x 15 mm NIR stent; mid RCA PTCA; distal RCA 3.5 x 15 mm NIR stent;      Social History:  History   Social History Narrative  . No narrative on file    Allergies  Allergen Reactions  . Amlodipine Other (See Comments)    constipation  . Ramipril Cough  . Adhesive [Tape] Hives    "peels skin off"  . Morphine And Related Other (See Comments)    Per Husband, Pt has hallucinations  . Penicillins Itching  . Codeine Rash    Family History  Problem Relation Age of Onset  . Stroke Mother   . Breast cancer Sister   . Emphysema Sister   . Cancer Sister     breast  . Alcohol abuse Brother   . Colon cancer      uncles x 2  . Prostate cancer Brother     Prior to Admission medications   Medication Sig Start Date End Date Taking? Authorizing Provider  acetaminophen (TYLENOL) 500 MG tablet Take 500 mg by mouth every 6 (six) hours as needed for pain.   Yes Historical Provider, MD  albuterol (PROVENTIL) (2.5 MG/3ML) 0.083% nebulizer solution Take 2.5 mg by nebulization every 6 (six) hours as needed for wheezing.   Yes Historical Provider, MD  aspirin EC 81 MG tablet Take 81 mg by mouth daily.   Yes Historical Provider, MD  atorvastatin (LIPITOR) 40 MG tablet Take 40 mg by mouth every morning.   Yes Historical Provider, MD  carvedilol (COREG) 12.5 MG tablet Take 0.5 tablets (6.25 mg total) by mouth 2 (two) times daily with a meal. 12/23/12  Yes Ok Anis, NP  diclofenac sodium  (VOLTAREN) 1 % GEL Apply 2 g topically 4 (four) times daily. 06/12/12  Yes Su Monks, PA-C  feeding supplement (GLUCERNA SHAKE) LIQD Take 237 mLs by mouth daily as needed (for nutrition).    Yes Historical Provider, MD  furosemide (LASIX) 80 MG tablet Take 1 tablet (80 mg total) by mouth 2 (two) times daily. 01/07/13  Yes Ok Anis, NP  gabapentin (NEURONTIN) 300 MG capsule Take 1 capsule (300 mg total) by mouth 2 (two) times daily. 12/03/12  Yes [name], MD  hydrOXYzine (ATARAX/VISTARIL) 10 MG tablet Take 1 tablet (  10 mg total) by mouth 2 (two) times daily. 12/03/12  Yes [name], MD  Iron-Folic Acid-B12-C-Docusate (FERRAPLUS 90) 90-1 MG TABS Take 1 tablet by mouth daily. 10/30/12  Yes [name], MD  isosorbide mononitrate (IMDUR) 60 MG 24 hr tablet Take 60 mg by mouth daily at 12 noon.   Yes Historical Provider, MD  losartan (COZAAR) 50 MG tablet Take 1 tablet (50 mg total) by mouth daily. 10/30/12  Yes [name], MD  magnesium oxide (MAG-OX) 400 MG tablet Take 1 tablet (400 mg total) by mouth daily. 11/23/12  Yes Leroy Sea, MD  nitroGLYCERIN (NITROSTAT) 0.4 MG SL tablet Place 0.4 mg under the tongue every 5 (five) minutes as needed for chest pain.   Yes Historical Provider, MD  omeprazole (PRILOSEC) 40 MG capsule Take 40 mg by mouth daily.   Yes Historical Provider, MD  polyethylene glycol (MIRALAX / GLYCOLAX) packet Take 17 g by mouth daily as needed (for constipation).   Yes Historical Provider, MD  sitaGLIPtan-metformin (JANUMET) 50-500 MG per tablet Take 1 tablet by mouth 2 (two) times daily with a meal.   Yes Historical Provider, MD  spironolactone (ALDACTONE) 25 MG tablet Take 1 tablet (25 mg total) by mouth daily. 11/18/12  Yes Rosalio Macadamia, NP  tiotropium (SPIRIVA) 18 MCG inhalation capsule Place 18 mcg into inhaler and inhale daily.   Yes Historical Provider, MD  traMADol (ULTRAM) 50 MG tablet Take 50-100 mg by mouth every 6 (six) hours as needed  for moderate pain.   Yes Historical Provider, MD  traZODone (DESYREL) 100 MG tablet Take 150 mg by mouth at bedtime.   Yes Historical Provider, MD  venlafaxine XR (EFFEXOR-XR) 150 MG 24 hr capsule Take 1 capsule (150 mg total) by mouth every morning. 12/03/12  Yes [name], MD  Lancet Devices (SIMPLE DIAGNOSTICS LANCING DEV) MISC Inject 1 Act into the skin 3 (three) times daily. 01/20/13   [name], MD   Physical Exam: Filed Vitals:   01/24/13 1400 01/24/13 1409 01/24/13 1423 01/24/13 1438  BP: 144/75 145/78    Temp:   96.2 F (35.7 C) 96.2 F (35.7 C)  TempSrc:  Oral Rectal   Resp: 8 14    SpO2:  100%       General:  EOMI, NCAT, pupils about 3 mm. No significant JVD per my exam, mucosa very dry some skin tenting noted  Eyes: No pallor no icterus  ENT: Poor dentition, uvula not visualized  Neck: Soft supple no bruit  Cardiovascular: S1-S2 no murmur rub or gallop, pacemaker site seems clean does not have any erythema and does not seem to have any purulent  Respiratory: Clinically clear, not able to cooperate with fremitus or resonance  Abdomen: Soft nontender no rebound no guarding  Skin: Skin over the sacral area is clear and clean with remnants of stage 1 scars but no active sacral decubiti  Musculoskeletal: Range of motion is limited as patient not cooperative  Psychiatric: Sleepy  Neurologic: Not able to cooperate fully with exam however moves limbs purposefully and react to Menest  Labs on Admission:  Basic Metabolic Panel:  Recent Labs Lab 01/24/13 1249 01/24/13 1315  NA 132* 127*  K 5.9* 5.5*  CL 90* 92*  CO2 21  --   GLUCOSE 99 99  BUN 41* 38*  CREATININE 2.00* 2.10*  CALCIUM 9.0  --    Liver Function Tests:  Recent Labs Lab 01/24/13 1249  AST 36  ALT 23  ALKPHOS 103  BILITOT 0.7  PROT 6.5  ALBUMIN 2.8*   No results found for this basename: LIPASE, AMYLASE,  in the last 168 hours  Recent Labs Lab 01/24/13 1249  AMMONIA 30    CBC:  Recent Labs Lab 01/24/13 1213 01/24/13 1315  WBC 4.9  --   NEUTROABS 4.2  --   HGB 9.2* 10.5*  HCT 27.2* 31.0*  MCV 91.0  --   PLT 260  --    Cardiac Enzymes: No results found for this basename: CKTOTAL, CKMB, CKMBINDEX, TROPONINI,  in the last 168 hours  BNP (last 3 results)  Recent Labs  11/29/12 2100 12/31/12 0652 01/24/13 1213  PROBNP 8012.0* 29201.0* 62827.0*   CBG:  Recent Labs Lab 01/24/13 1206  GLUCAP 89    Radiological Exams on Admission: Dg Chest 1 View  01/24/2013   CLINICAL DATA:  Pain.  Fall and unresponsive.  EXAM: CHEST - 1 VIEW  COMPARISON:  12/31/2012  FINDINGS: 3 lead left-sided pacemaker is unchanged in appearance. Marked enlargement of the cardiac silhouette is unchanged. There is mild pulmonary vascular congestion, increased from prior, with mild perihilar edema. There is a small to moderate right pleural effusion, increased from prior. No acute osseous abnormality is identified.  IMPRESSION: Cardiomegaly with increased pulmonary vascular congestion and mild perihilar edema. Increased, small to moderate right pleural effusion.   Electronically Signed   By: Sebastian Ache   On: 01/24/2013 13:58   Dg Hip Complete Right  01/24/2013   CLINICAL DATA:  Fall.  Unresponsive.  EXAM: RIGHT HIP - COMPLETE 2+ VIEW  COMPARISON:  04/10/2011  FINDINGS: Mild sclerosis about the right greater than left sacroiliac joints is unchanged. There is no evidence of acute fracture. Both hips demonstrate mild joint space narrowing and marginal osteophytosis, with a prominent subchondral cyst present in the left femoral head. There is no evidence of dislocation. Soft tissues are unremarkable.  IMPRESSION: 1. No evidence of acute fracture or dislocation. 2. Left greater than right hip osteoarthrosis. 3. Unchanged, mild sclerosis of the right greater than left SI joints.   Electronically Signed   By: Sebastian Ache   On: 01/24/2013 14:10   Ct Head Wo Contrast  01/24/2013    CLINICAL DATA:  Altered mental status.  EXAM: CT HEAD WITHOUT CONTRAST  TECHNIQUE: Contiguous axial images were obtained from the base of the skull through the vertex without intravenous contrast.  COMPARISON:  Head CT scan 11/19/2012 and brain MRI 05/20/2012.  FINDINGS: Patchy and confluent hypoattenuation in the subcortical and periventricular deep white matter is compatible with chronic microvascular ischemic change. There is no evidence of acute intracranial abnormality including infarction, hemorrhage, mass lesion, mass effect, midline shift or abnormal extra-axial fluid collection. No hydrocephalus or pneumocephalus. Tiny locules of gas in the right pterygoid loss are likely related to venous access. Atherosclerosis is noted.  IMPRESSION: No acute abnormality.  Extensive chronic microvascular ischemic change.   Electronically Signed   By: Drusilla Kanner M.D.   On: 01/24/2013 13:47    EKG: Independently reviewed. Based rhythm no significant ST-T wave changes from prior  Assessment/Plan  Problem  Lactic Acidosis-multifactorial and probably secondary to metformin , sepsis, renal failure   Acute metabolic Encephalopathy-await ammonia level.  I think she may have impending uremia given her elevated potassium and renal failure.  If her potassium does not improve and she continues to trend in the direction of declining renal function, We will need to discuss with family as  well as with nephrology likely options. I also notice patient is on gabapentin 300 twice a day, Vistaril 10 twice a day, Effexor 150 every morning , tramadol 50-100 every 6 when necessary pain, trazodone 150 each bedtime-all of which point the significant polypharmacy in a patient with impending renal insufficiency. I suspect if we remove these offending medications particularly her psychotropic agents as well as her ARB, her mentation may improve   Sepsis-likely early onset sepsis-she has a history of potential aspiration per last  hospital notes when she was admitted for pacemaker placement 11/2012 she meets SIRS criterion but is maintaining her blood pressures and is not hypotensive currently.  Clinically she is confused however. Given her lactic acidosis 3, and Rectal temp 96 which could be multifactorial secondary to either acute renal failure , metformin use, will delineate this with a pro calcitonin-I will start cefepime and vancomycin as she has an allergy to penicillin and we will monitor her .  She carries a very poor prognosis with multiorgan issues at present but my understanding is she wants to be full code according to the husband who spoke with Dr. Rickey Barbaraamaswamy  She'll be admitted to step down for close monitoring   Pacemaker-cardiology has been already consulted by critical care medicine-appreciate the help in advance   Acute On Chronic Systolic Chf (Congestive Heart Failure), Nyha Class 3-cardiology consulted. Unclear if there any other options. She was given a dose of Lasix 20 mg clinically she is very dry although she has volume overload with grade 3 lower extremity pitting edema   Cardiorenal syndrome-cardiology to comment   Acute kidney injury -difficult situation. Volume overloaded lower extremities however clinically dry . Defer to cardiology regarding further plan of care. I suspect she would benefit from a goals of care discussion with their input regarding what else can be done   OBSTRUCTIVE SLEEP APNEA-noncompliant?  she is supposed to be on CPAP   Moderate Copd (Chronic Obstructive Pulmonary Disease)-monitor-continue inhalers   Moderate dementia-monitor . Ensure sleep wake full cycle     CORONARY ARTERY DISEASE-as above-continue aspirin 81 mg if she wakes up enough    Time spent: 1170  Mahala MenghiniSAMTANI, Alaska Digestive CenterJAI-GURMUKH [facility] Pager [phone]  If 7PM-7AM, please contact night-coverage [email or web address] Password [facility] 01/24/2013, 3:09 PM       Addend- Husband tells me that since she left the NH she got  weaker and got fluid retention and was falling down.  She was taking all her meds also.  Usual weight 147 lbs.  Not really eating since even before PPM.  Her baseline is "she can't do anything" She used to be able to walk a little.  She needed to use a potty chair and a transport chair at home-has been doing this before PPM-she was on Hospice before for HF and her doctor, Dr. Sanda Lingerhomas Jones recommended Hospice but this was rescinded mid Dec after PPM Her tells me that she wanted FULL CODE, but this was "some time" Would want PEG Agreeable to Palliative Consult to determine GOC

## 2013-01-24 NOTE — ED Provider Notes (Signed)
Date: 01/24/2013  Rate: 68  Rhythm: normal sinus rhythm  QRS Axis: normal  Intervals: QT prolonged  ST/T Wave abnormalities: nonspecific ST/T changes  Conduction Disutrbances:none  Narrative Interpretation:   Old EKG Reviewed: unchanged    Glynn OctaveStephen Niemah Schwebke, MD 01/24/13 1409

## 2013-01-24 NOTE — ED Notes (Signed)
Lactic acid result shown to Dr Manus Gunningancour.

## 2013-01-24 NOTE — Consult Note (Signed)
Reason for Consult: CHF Referring Physician: Internal Medicine  Note: Pt admitted for encephalopathy. She is arouseable but somnolent. No family present by beside. A good history could not be obtained by the patient. The majority of information below was obtained by chart review.   HPI: The patient is a 68 y/o female. Her primary cardiologist is Dr. Percival Spanish and Dr. Caryl Comes is her electrophysiologist. She has a history of CAD, ischemic cardiomyopathy with chronic systolic chf, s/p BiV AICD placement in December 2014, COPD on home O2, type II diabetes mellitus, hypertension, and dementia. She was recently admitted on 12/24/12 and discharged on 12/31/12 for acute on chronic systolic HF exacerbation. She was discharged to a SNF, however she left AMA after only 4 days of being there and she has had progressive decline since then. She apparently has become progressively weaker and has had multiple falls at home. Her husband apparently noticed altered mental status today and brought her here for evaluation.    TRH has admitted for encephalopathy. W/u also reveals an elevated BNP of 62,827. Her CXR demonstrates cardiomegaly with increased pulmonary vascular congestion, mild perihilar edema and increased, small to moderate right pleural effusions. Her SCr is elevated at 2.10. We have been consulted to assist with CHF.   The patient denies CP/SOB.    Past Medical History  Diagnosis Date  . DYSLIPIDEMIA   . OBESITY NOS   . DEPRESSION, RECURRENT, IN PARTIAL REMISSION   . HYPERTENSION   . CORONARY ARTERY DISEASE     a. s/p Q wave MI 2002 with RCA stent x 2. b. Abnl nuc 04/2012 with subsequent cath showing patent stents in RCA, chronic diffuse disease in dLAD, 20% LM, EF 20%, no focal targets for PCI.  [name] GERD   . DIVERTICULOSIS, COLON, WITH HEMORRHAGE   . BACK PAIN, LUMBAR, WITH RADICULOPATHY   . OBSTRUCTIVE SLEEP APNEA   . Anemia, iron deficiency   . Hyponatremia   . Tobacco abuse   . Moderate COPD  (chronic obstructive pulmonary disease) 11/07/2005    Spirometry 03/09/11>>FEV1 2.02 (88%), FEV1% 76    . Anxiety   . Dementia     a. Saw neuro 08/2012: concerning for early dementia, labs unremarkable except elev CRP.  [name] Chronic edema   . Weight loss     CT abd pelvis 05/2012: mild small bowel disention. No significant acute changes. Mod-severe gastritis by EGD 06/06/2012 no clear neoplastic changes. Neg small bowel follow through except for diverticulum on 06/11/12.   . Gastritis     a. by EGD 05/2012.  [name] Chronic systolic CHF (congestive heart failure)     a. Last EF 20% by cath 04/29/2012, 40-45% by echo 04/24/12.;  b.  Echocardiogram  (11/21/12): EF 10-15%, severe diffuse HK, moderate MR, moderate LAE, mild RVE, moderately reduced RVSF, moderate to severe RAE, moderate TR, PASP 56  . Ischemic cardiomyopathy   . Syncope     a. 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 BB discontinued. b. Recurrence in 11/2012.=> Life Vest placed pending ICD placmement   . 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 12/2012 [facility] BiV ICD.  [name] Myocardial infarction 2002  . COPD (chronic obstructive pulmonary disease)   . Pneumonia ?2012  . Shortness of breath     "bad; most of the time" (12/31/2012)  . Type II diabetes mellitus   . Arthritis     "all over" (12/31/2012)  . Mid back pain, chronic     Past Surgical  History  Procedure Laterality Date  . Uvuloplasty    . Total abdominal hysterectomy w/ bilateral salpingoophorectomy    . Cholecystectomy  2004  . Esophagogastroduodenoscopy (egd) with propofol N/A 06/06/2012    Procedure: ESOPHAGOGASTRODUODENOSCOPY (EGD) WITH PROPOFOL;  Surgeon: Milus Banister, MD;  Location: [facility];  Service: Endoscopy;  Laterality: N/A;  . Cardiac defibrillator placement  12/16/2012    dual chamber  DR Caryl Comes  . Tonsillectomy    . Appendectomy    . Total abdominal hysterectomy    . Carpal tunnel release Left   . Refractive  surgery Bilateral   . Coronary angioplasty with stent placement  2002    Thrombectomy; prox RCA 3.5 x 15 mm NIR stent; mid RCA PTCA; distal RCA 3.5 x 15 mm NIR stent;       Family History  Problem Relation Age of Onset  . Stroke Mother   . Breast cancer Sister   . Emphysema Sister   . Cancer Sister     breast  . Alcohol abuse Brother   . Colon cancer      uncles x 2  . Prostate cancer Brother     Social History:  reports that she has been smoking Cigarettes.  She has a 6.24 pack-year smoking history. She has never used smokeless tobacco. She reports that she drinks alcohol. She reports that she does not use illicit drugs.  Allergies:  Allergies  Allergen Reactions  . Amlodipine Other (See Comments)    constipation  . Ramipril Cough  . Adhesive [Tape] Hives    "peels skin off"  . Morphine And Related Other (See Comments)    Per Husband, Pt has hallucinations  . Penicillins Itching  . Codeine Rash    Medications:  Prior to Admission medications   Medication Sig Start Date End Date Taking? Authorizing Provider  acetaminophen (TYLENOL) 500 MG tablet Take 500 mg by mouth every 6 (six) hours as needed for pain.   Yes Historical Provider, MD  albuterol (PROVENTIL) (2.5 MG/3ML) 0.083% nebulizer solution Take 2.5 mg by nebulization every 6 (six) hours as needed for wheezing.   Yes Historical Provider, MD  aspirin EC 81 MG tablet Take 81 mg by mouth daily.   Yes Historical Provider, MD  atorvastatin (LIPITOR) 40 MG tablet Take 40 mg by mouth every morning.   Yes Historical Provider, MD  carvedilol (COREG) 12.5 MG tablet Take 0.5 tablets (6.25 mg total) by mouth 2 (two) times daily with a meal. 12/23/12  Yes Rogelia Mire, NP  diclofenac sodium (VOLTAREN) 1 % GEL Apply 2 g topically 4 (four) times daily. 06/12/12  Yes Aundria Mems, PA-C  feeding supplement (GLUCERNA SHAKE) LIQD Take 237 mLs by mouth daily as needed (for nutrition).    Yes Historical Provider, MD  furosemide  (LASIX) 80 MG tablet Take 1 tablet (80 mg total) by mouth 2 (two) times daily. 01/07/13  Yes Rogelia Mire, NP  gabapentin (NEURONTIN) 300 MG capsule Take 1 capsule (300 mg total) by mouth 2 (two) times daily. 12/03/12  Yes Janith Lima, MD  hydrOXYzine (ATARAX/VISTARIL) 10 MG tablet Take 1 tablet (10 mg total) by mouth 2 (two) times daily. 12/03/12  Yes Janith Lima, MD  Iron-Folic [id number] (FERRAPLUS 90) 90-1 MG TABS Take 1 tablet by mouth daily. 10/30/12  Yes Janith Lima, MD  isosorbide mononitrate (IMDUR) 60 MG 24 hr tablet Take 60 mg by mouth daily at 12 noon.   Yes Historical Provider, MD  losartan (  COZAAR) 50 MG tablet Take 1 tablet (50 mg total) by mouth daily. 10/30/12  Yes Janith Lima, MD  magnesium oxide (MAG-OX) 400 MG tablet Take 1 tablet (400 mg total) by mouth daily. 11/23/12  Yes Thurnell Lose, MD  nitroGLYCERIN (NITROSTAT) 0.4 MG SL tablet Place 0.4 mg under the tongue every 5 (five) minutes as needed for chest pain.   Yes Historical Provider, MD  omeprazole (PRILOSEC) 40 MG capsule Take 40 mg by mouth daily.   Yes Historical Provider, MD  polyethylene glycol (MIRALAX / GLYCOLAX) packet Take 17 g by mouth daily as needed (for constipation).   Yes Historical Provider, MD  sitaGLIPtan-metformin (JANUMET) 50-500 MG per tablet Take 1 tablet by mouth 2 (two) times daily with a meal.   Yes Historical Provider, MD  spironolactone (ALDACTONE) 25 MG tablet Take 1 tablet (25 mg total) by mouth daily. 11/18/12  Yes Burtis Junes, NP  tiotropium (SPIRIVA) 18 MCG inhalation capsule Place 18 mcg into inhaler and inhale daily.   Yes Historical Provider, MD  traMADol (ULTRAM) 50 MG tablet Take 50-100 mg by mouth every 6 (six) hours as needed for moderate pain.   Yes Historical Provider, MD  traZODone (DESYREL) 100 MG tablet Take 150 mg by mouth at bedtime.   Yes Historical Provider, MD  venlafaxine XR (EFFEXOR-XR) 150 MG 24 hr capsule Take 1 capsule (150 mg total) by mouth  every morning. 12/03/12  Yes Janith Lima, MD  Lancet Devices (SIMPLE DIAGNOSTICS LANCING DEV) MISC Inject 1 Act into the skin 3 (three) times daily. 01/20/13   Janith Lima, MD     Results for orders placed during the hospital encounter of 01/24/13 (from the past 48 hour(s))  GLUCOSE, CAPILLARY     Status: None   Collection Time    01/24/13 12:06 PM      Result Value Range   Glucose-Capillary 89  70 - 99 mg/dL  CBC WITH DIFFERENTIAL     Status: Abnormal   Collection Time    01/24/13 12:13 PM      Result Value Range   WBC 4.9  4.0 - 10.5 K/uL   RBC 2.99 (*) 3.87 - 5.11 MIL/uL   Hemoglobin 9.2 (*) 12.0 - 15.0 g/dL   HCT 27.2 (*) 36.0 - 46.0 %   MCV 91.0  78.0 - 100.0 fL   MCH 30.8  26.0 - 34.0 pg   MCHC 33.8  30.0 - 36.0 g/dL   RDW 21.3 (*) 11.5 - 15.5 %   Platelets 260  150 - 400 K/uL   Neutrophils Relative % 85 (*) 43 - 77 %   Lymphocytes Relative 12  12 - 46 %   Monocytes Relative 3  3 - 12 %   Eosinophils Relative 0  0 - 5 %   Basophils Relative 0  0 - 1 %   Neutro Abs 4.2  1.7 - 7.7 K/uL   Lymphs Abs 0.6 (*) 0.7 - 4.0 K/uL   Monocytes Absolute 0.1  0.1 - 1.0 K/uL   Eosinophils Absolute 0.0  0.0 - 0.7 K/uL   Basophils Absolute 0.0  0.0 - 0.1 K/uL   RBC Morphology ACANTHOCYTES    PRO B NATRIURETIC PEPTIDE     Status: Abnormal   Collection Time    01/24/13 12:13 PM      Result Value Range   Pro B Natriuretic peptide (BNP) 62827.0 (*) 0 - 125 pg/mL  URINALYSIS, ROUTINE W REFLEX MICROSCOPIC  Status: None   Collection Time    01/24/13 12:30 PM      Result Value Range   Color, Urine YELLOW  YELLOW   APPearance CLEAR  CLEAR   Specific Gravity, Urine 1.016  1.005 - 1.030   pH 5.5  5.0 - 8.0   Glucose, UA NEGATIVE  NEGATIVE mg/dL   Hgb urine dipstick NEGATIVE  NEGATIVE   Bilirubin Urine NEGATIVE  NEGATIVE   Ketones, ur NEGATIVE  NEGATIVE mg/dL   Protein, ur NEGATIVE  NEGATIVE mg/dL   Urobilinogen, UA 0.2  0.0 - 1.0 mg/dL   Nitrite NEGATIVE  NEGATIVE   Leukocytes,  UA NEGATIVE  NEGATIVE   Comment: MICROSCOPIC NOT DONE ON URINES WITH NEGATIVE PROTEIN, BLOOD, LEUKOCYTES, NITRITE, OR GLUCOSE <1000 mg/dL.  STREP PNEUMONIAE URINARY ANTIGEN     Status: None   Collection Time    01/24/13 12:30 PM      Result Value Range   Strep Pneumo Urinary Antigen NEGATIVE  NEGATIVE   Comment:            Infection due to S. pneumoniae     cannot be absolutely ruled out     since the antigen present     may be below the detection limit     of the test.  POCT I-STAT 3, BLOOD GAS (G3+)     Status: None   Collection Time    01/24/13 12:45 PM      Result Value Range   pH, Arterial 7.391  7.350 - 7.450   pCO2 arterial 38.7  35.0 - 45.0 mmHg   pO2, Arterial 86.0  80.0 - 100.0 mmHg   Bicarbonate 23.5  20.0 - 24.0 mEq/L   TCO2 25  0 - 100 mmol/L   O2 Saturation 97.0     Acid-base deficit 1.0  0.0 - 2.0 mmol/L   Collection site RADIAL, ALLEN'S TEST ACCEPTABLE     Drawn by Operator     Sample type ARTERIAL    COMPREHENSIVE METABOLIC PANEL     Status: Abnormal   Collection Time    01/24/13 12:49 PM      Result Value Range   Sodium 132 (*) 137 - 147 mEq/L   Potassium 5.9 (*) 3.7 - 5.3 mEq/L   Chloride 90 (*) 96 - 112 mEq/L   CO2 21  19 - 32 mEq/L   Glucose, Bld 99  70 - 99 mg/dL   BUN 41 (*) 6 - 23 mg/dL   Creatinine, Ser 2.00 (*) 0.50 - 1.10 mg/dL   Calcium 9.0  8.4 - 10.5 mg/dL   Total Protein 6.5  6.0 - 8.3 g/dL   Albumin 2.8 (*) 3.5 - 5.2 g/dL   AST 36  0 - 37 U/L   ALT 23  0 - 35 U/L   Alkaline Phosphatase 103  39 - 117 U/L   Total Bilirubin 0.7  0.3 - 1.2 mg/dL   GFR calc non Af Amer 24 (*) >90 mL/min   GFR calc Af Amer 28 (*) >90 mL/min   Comment: (NOTE)     The eGFR has been calculated using the CKD EPI equation.     This calculation has not been validated in all clinical situations.     eGFR's persistently <90 mL/min signify possible Chronic Kidney     Disease.  PROTIME-INR     Status: Abnormal   Collection Time    01/24/13 12:49 PM      Result  Value Range  Prothrombin Time 18.8 (*) 11.6 - 15.2 seconds   INR 1.62 (*) 0.00 - 1.49  AMMONIA     Status: None   Collection Time    01/24/13 12:49 PM      Result Value Range   Ammonia 30  11 - 60 umol/L  CG4 I-STAT (LACTIC ACID)     Status: Abnormal   Collection Time    01/24/13  1:13 PM      Result Value Range   Lactic Acid, Venous 3.47 (*) 0.5 - 2.2 mmol/L  POCT I-STAT TROPONIN I     Status: None   Collection Time    01/24/13  1:14 PM      Result Value Range   Troponin i, poc 0.03  0.00 - 0.08 ng/mL   Comment 3            Comment: Due to the release kinetics of cTnI,     a negative result within the first hours     of the onset of symptoms does not rule out     myocardial infarction with certainty.     If myocardial infarction is still suspected,     repeat the test at appropriate intervals.  POCT I-STAT, CHEM 8     Status: Abnormal   Collection Time    01/24/13  1:15 PM      Result Value Range   Sodium 127 (*) 137 - 147 mEq/L   Potassium 5.5 (*) 3.7 - 5.3 mEq/L   Chloride 92 (*) 96 - 112 mEq/L   BUN 38 (*) 6 - 23 mg/dL   Creatinine, Ser 2.10 (*) 0.50 - 1.10 mg/dL   Glucose, Bld 99  70 - 99 mg/dL   Calcium, Ion 1.16  1.13 - 1.30 mmol/L   TCO2 24  0 - 100 mmol/L   Hemoglobin 10.5 (*) 12.0 - 15.0 g/dL   HCT 31.0 (*) 36.0 - 46.0 %    Dg Chest 1 View  01/24/2013   CLINICAL DATA:  Pain.  Fall and unresponsive.  EXAM: CHEST - 1 VIEW  COMPARISON:  12/31/2012  FINDINGS: 3 lead left-sided pacemaker is unchanged in appearance. Marked enlargement of the cardiac silhouette is unchanged. There is mild pulmonary vascular congestion, increased from prior, with mild perihilar edema. There is a small to moderate right pleural effusion, increased from prior. No acute osseous abnormality is identified.  IMPRESSION: Cardiomegaly with increased pulmonary vascular congestion and mild perihilar edema. Increased, small to moderate right pleural effusion.   Electronically Signed   By: Logan Bores   On: 01/24/2013 13:58   Dg Hip Complete Right  01/24/2013   CLINICAL DATA:  Fall.  Unresponsive.  EXAM: RIGHT HIP - COMPLETE 2+ VIEW  COMPARISON:  04/10/2011  FINDINGS: Mild sclerosis about the right greater than left sacroiliac joints is unchanged. There is no evidence of acute fracture. Both hips demonstrate mild joint space narrowing and marginal osteophytosis, with a prominent subchondral cyst present in the left femoral head. There is no evidence of dislocation. Soft tissues are unremarkable.  IMPRESSION: 1. No evidence of acute fracture or dislocation. 2. Left greater than right hip osteoarthrosis. 3. Unchanged, mild sclerosis of the right greater than left SI joints.   Electronically Signed   By: Logan Bores   On: 01/24/2013 14:10   Ct Head Wo Contrast  01/24/2013   CLINICAL DATA:  Altered mental status.  EXAM: CT HEAD WITHOUT CONTRAST  TECHNIQUE: Contiguous axial images were obtained from the base  of the skull through the vertex without intravenous contrast.  COMPARISON:  Head CT scan 11/19/2012 and brain MRI 05/20/2012.  FINDINGS: Patchy and confluent hypoattenuation in the subcortical and periventricular deep white matter is compatible with chronic microvascular ischemic change. There is no evidence of acute intracranial abnormality including infarction, hemorrhage, mass lesion, mass effect, midline shift or abnormal extra-axial fluid collection. No hydrocephalus or pneumocephalus. Tiny locules of gas in the right pterygoid loss are likely related to venous access. Atherosclerosis is noted.  IMPRESSION: No acute abnormality.  Extensive chronic microvascular ischemic change.   Electronically Signed   By: Inge Rise M.D.   On: 01/24/2013 13:47    Review of Systems  Unable to perform ROS: medical condition   Blood pressure 145/84, pulse 69, temperature 96.2 F (35.7 C), temperature source Rectal, resp. rate 18, height 5' 9.69" (1.77 m), weight 159 lb 13.3 oz (72.5 kg), SpO2  95.00%. Physical Exam  Constitutional: No distress.  Neck: JVD (at level of ear) present.  Cardiovascular: Normal rate, regular rhythm and intact distal pulses.   No murmur heard. Respiratory: Effort normal and breath sounds normal. No respiratory distress. She has no wheezes. She has no rales.  Musculoskeletal: She exhibits edema (2+ bilateral pitting edema).  Skin: Skin is warm and dry. She is not diaphoretic.    Assessment/Plan: Active Problems:   CORONARY ARTERY DISEASE   Moderate COPD (chronic obstructive pulmonary disease)   OBSTRUCTIVE SLEEP APNEA   Chronic systolic CHF (congestive heart failure), NYHA class 3   CHF (congestive heart failure)   Acute on chronic systolic CHF (congestive heart failure)   Lactic acidosis   Acute encephalopathy   Sepsis   Pacemaker   Cardiorenal syndrome with renal failure  Plan: 68 y/o female with ischemic CM, s/p ICD insertion in Dec. 2014, admitted for encephalopathy, also with stage III systolic CHF. Resume home meds. Treat CHF with 80 mg IV Lasix BID. Consult palliative care. Will wait decision  by patient and family before turning off ICD.      Lyda Jester 01/24/2013, 5:24 PM  Patient seen and examined and history reviewed. Agree with above findings and plan. Unfortunate 68 yo BF with advanced heart failure presents with failure to thrive and frequent falls. Patient is somnolent and unable to give a history. Currently denies SOB or pain. Exam consistent with chronic CHF with mild rales, JVD, 3+ edema to the thighs and loud S3. Clinically she has progressive encephalopathy and cardiorenal syndrome. Agree with IV diuresis for now. Continue other home meds. Prognosis is very poor and I think appropriate management now is palliative care and Hospice. Will await palliative care consult and discussion with family. If in agreement would turn off ICD therapies.  Collier Salina [facility] 01/24/2013 6:28 PM

## 2013-01-24 NOTE — ED Notes (Signed)
Pt presents via [facility] with c/o of altered mental status.  Husband at home who cares for wife has decreased ability to care for wife.

## 2013-01-24 NOTE — ED Provider Notes (Signed)
Medical screening examination/treatment/procedure(s) were conducted as a shared visit with non-physician practitioner(s) and myself.  I personally evaluated the patient during the encounter.  Decreased level of consciouness, failure to thrive and recurrent falls.  Somnolent, oriented x1, falls back asleep. +JVD and +3 pitting edema. Recent admission for CHF, ICD Placement, CKD. Acute on chronic CHF, likely cardiorenal syndrome with hyperkalemia and renal failure.  Trial IV diuresis. D/w hospitalist and cards. Prognosis poor.   EKG Interpretation    Date/Time:    Ventricular Rate:    PR Interval:    QRS Duration:   QT Interval:    QTC Calculation:   R Axis:     Text Interpretation:             Angiocath insertion Performed by: Glynn OctaveANCOUR, Daziya Redmond  Consent: Verbal consent obtained. Risks and benefits: risks, benefits and alternatives were discussed Time out: Immediately prior to procedure a "time out" was called to verify the correct patient, procedure, equipment, support staff and site/side marked as required.  Preparation: Patient was prepped and draped in the usual sterile fashion.  Vein Location: R EJ  Not Ultrasound Guided  Gauge: 20  Normal blood return and flush without difficulty Patient tolerance: Patient tolerated the procedure well with no immediate complications.     Glynn OctaveStephen Garison [place], MD 01/24/13 [phone]

## 2013-01-24 NOTE — Consult Note (Addendum)
PULMONARY  / CRITICAL CARE MEDICINE  Name: [name] MRN: [id number] DOB: 09/09/1945 PCP Sanda Lingerhomas Jones, MD    ADMISSION DATE:  01/24/2013  LOS 0 days   CONSULTATION DATE:  01/24/2013   REFERRING MD :  Dr Manus Gunningancour of ER and Dr Mahala MenghiniSamtani of [facility] PRIMARY SERVICE: [facility]  CHIEF COMPLAINT:  Altered sensorium, lactic acidosis, cold clammy, mild renal insuff with baseline chronic systolic CHF  BRIEF PATIENT DESCRIPTION: see HPI  SIGNIFICANT EVENTS / STUDIES:  01/24/2013  - Admit   LINES / TUBES: None  CULTURES: Blood Urine urien leg Urine strep REsp virus multilex panel  ANTIBIOTICS: Per [facility]  HISTORY OF PRESENT ILLNESS:    68 year old AA female African American female with severe chronic systolic heart failure ejection fraction 20%. Multiple admissions due to noncompliance and different issues and multiple medical problems. Most recently admitted between 12/31/2012 and 01/07/2013 for congestive heart failure exacerbation. She was successfully diuresed but had problems with some kidney insufficiency. She also had Klebsiella urinary tract infection at this admission. She was finally discharged to a skilled nursing facility with rehabilitation but according to the husband she only spent a short time there and prematurely discharged herself a few weeks ago to home. Since being at home for the last few weeks she's had failure to thrive increasing falls, poor appetite, probably due to drink water and fluids and possibly not taking her medications. The last few days she's had altered sensorium and more sleepy. Therefore husband brought her into the emergency department. In the emergency department found to be hypothermic (1640f)  have slightly elevated lactic acidosis of 3.5 (on metformin), an increase in creatinine to the 2s but she is maintaining blood pressure and heart rate and respiratory status although she was cool and clammy in the periphery of her chronic edema. Critical care  medicine has been asked to consult by [facility]   PAST MEDICAL HISTORY :  Past Medical History  Diagnosis Date  . DYSLIPIDEMIA   . OBESITY NOS   . DEPRESSION, RECURRENT, IN PARTIAL REMISSION   . HYPERTENSION   . CORONARY ARTERY DISEASE     a. s/p Q wave MI 2002 with RCA stent x 2. b. Abnl nuc 04/2012 with subsequent cath showing patent stents in RCA, chronic diffuse disease in dLAD, 20% LM, EF 20%, no focal targets for PCI.  [name]. GERD   . DIVERTICULOSIS, COLON, WITH HEMORRHAGE   . BACK PAIN, LUMBAR, WITH RADICULOPATHY   . OBSTRUCTIVE SLEEP APNEA   . Anemia, iron deficiency   . Hyponatremia   . Tobacco abuse   . Moderate COPD (chronic obstructive pulmonary disease) 11/07/2005    Spirometry 03/09/11>>FEV1 2.02 (88%), FEV1% 76    . Anxiety   . Dementia     a. Saw neuro 08/2012: concerning for early dementia, labs unremarkable except elev CRP.  [name]. Chronic edema   . Weight loss     CT abd pelvis 05/2012: mild small bowel disention. No significant acute changes. Mod-severe gastritis by EGD 06/06/2012 no clear neoplastic changes. Neg small bowel follow through except for diverticulum on 06/11/12.   . Gastritis     a. by EGD 05/2012.  [name]. Chronic systolic CHF (congestive heart failure)     a. Last EF 20% by cath 04/29/2012, 40-45% by echo 04/24/12.;  b.  Echocardiogram  (11/21/12): EF 10-15%, severe diffuse HK, moderate MR, moderate LAE, mild RVE, moderately reduced RVSF, moderate to severe RAE, moderate TR, PASP 56  .  Ischemic cardiomyopathy   . Syncope     a. 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 BB discontinued. b. Recurrence in 11/2012.=> Life Vest placed pending ICD placmement   . 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 12/2012 SJM BiV ICD.  [name] Myocardial infarction 2002  . COPD (chronic obstructive pulmonary disease)   . Pneumonia ?2012  . Shortness of breath     "bad; most of the time" (12/31/2012)  . Type II diabetes mellitus    . Arthritis     "all over" (12/31/2012)  . Mid back pain, chronic      Family History  Problem Relation Age of Onset  . Stroke Mother   . Breast cancer Sister   . Emphysema Sister   . Cancer Sister     breast  . Alcohol abuse Brother   . Colon cancer      uncles x 2  . Prostate cancer Brother      History   Social History  . Marital Status: Married    Spouse Name: N/A    Number of Children: 1  . Years of Education: 12   Occupational History  . Retired Insurance account manager for [employer]    Social History Main Topics  . Smoking status: Current Some Day Smoker -- 0.12 packs/day for 52 years    Types: Cigarettes    Last Attempt to Quit: 07/17/2012  . Smokeless tobacco: Never Used  . Alcohol Use: Yes     Comment: Patient drinks caffeinated beverages daily; 12/31/2012 "drank a little when I was younger; nothing for years"  . Drug Use: No  . Sexual Activity: Not Currently    Birth Control/ Protection: Surgical   Other Topics Concern  . Not on file   Social History Narrative  . No narrative on file     Allergies  Allergen Reactions  . Amlodipine Other (See Comments)    constipation  . Ramipril Cough  . Adhesive [Tape] Hives    "peels skin off"  . Morphine And Related Other (See Comments)    Per Husband, Pt has hallucinations  . Penicillins Itching  . Codeine Rash      (Not in an outpatient encounter)     REVIEW OF SYSTEMS:  *per HPI. Otherweise 11 point ROS negative SUBJECTIVE:   VITAL SIGNS: Filed Vitals:   01/24/13 1400 01/24/13 1409 01/24/13 1423 01/24/13 1438  BP: 144/75 145/78    Temp:   96.2 F (35.7 C) 96.2 F (35.7 C)  TempSrc:  Oral Rectal   Resp: 8 14    SpO2:  100%           VENTILATOR SETTINGS: Vent Mode:  [-] PRVC FiO2 (%):  [30 %] 30 % Set Rate:  [28 bmp] 28 bmp Vt Set:  [420 mL] 420 mL PEEP:  [5 cmH20] 5 cmH20 Plateau Pressure:  [4 cmH20-21 cmH20] 20 cmH20 INTAKE / OUTPUT:       PHYSICAL  EXAMINATION: General:  Cachectic female. Sittig in bed in ER. LOOKS VERY DRY Neuro:  Drowsy. RASS -2 equvalent HEENT:  Supple neck, PEERL +. No neck stiffness Cardiovascular:  Hr 60s.. Normal BP. S1S2+.  Lungs:  CTA biklaterally Abdomen:  Soft, No mass, No organomegaly Musculoskeletal:  + edema in feet Skin:  Cool, clammy periphery  LABS: PULMONARY  Recent Labs Lab 01/24/13 1245 01/24/13 1315  PHART 7.391  --   PCO2ART 38.7  --   PO2ART 86.0  --  HCO3 23.5  --   TCO2 25 24  O2SAT 97.0  --     CBC  Recent Labs Lab 01/24/13 1213 01/24/13 1315  HGB 9.2* 10.5*  HCT 27.2* 31.0*  WBC 4.9  --   PLT 260  --     COAGULATION  Recent Labs Lab 01/24/13 1249  INR 1.62*    CARDIAC  No results found for this basename: TROPONINI,  in the last 168 hours  Recent Labs Lab 01/24/13 1213  PROBNP 62827.0*     CHEMISTRY  Recent Labs Lab 01/24/13 1249 01/24/13 1315  NA 132* 127*  K 5.9* 5.5*  CL 90* 92*  CO2 21  --   GLUCOSE 99 99  BUN 41* 38*  CREATININE 2.00* 2.10*  CALCIUM 9.0  --    The CrCl is unknown because both a height and weight (above a minimum accepted value) are required for this calculation.   LIVER  Recent Labs Lab 01/24/13 1249  AST 36  ALT 23  ALKPHOS 103  BILITOT 0.7  PROT 6.5  ALBUMIN 2.8*  INR 1.62*     INFECTIOUS  Recent Labs Lab 01/24/13 1313  LATICACIDVEN 3.47*     ENDOCRINE CBG (last 3)   Recent Labs  01/24/13 1206  GLUCAP 89         IMAGING x48h  Dg Chest 1 View  01/24/2013   CLINICAL DATA:  Pain.  Fall and unresponsive.  EXAM: CHEST - 1 VIEW  COMPARISON:  12/31/2012  FINDINGS: 3 lead left-sided pacemaker is unchanged in appearance. Marked enlargement of the cardiac silhouette is unchanged. There is mild pulmonary vascular congestion, increased from prior, with mild perihilar edema. There is a small to moderate right pleural effusion, increased from prior. No acute osseous abnormality is identified.   IMPRESSION: Cardiomegaly with increased pulmonary vascular congestion and mild perihilar edema. Increased, small to moderate right pleural effusion.   Electronically Signed   By: Sebastian Ache   On: 01/24/2013 13:58   Dg Hip Complete Right  01/24/2013   CLINICAL DATA:  Fall.  Unresponsive.  EXAM: RIGHT HIP - COMPLETE 2+ VIEW  COMPARISON:  04/10/2011  FINDINGS: Mild sclerosis about the right greater than left sacroiliac joints is unchanged. There is no evidence of acute fracture. Both hips demonstrate mild joint space narrowing and marginal osteophytosis, with a prominent subchondral cyst present in the left femoral head. There is no evidence of dislocation. Soft tissues are unremarkable.  IMPRESSION: 1. No evidence of acute fracture or dislocation. 2. Left greater than right hip osteoarthrosis. 3. Unchanged, mild sclerosis of the right greater than left SI joints.   Electronically Signed   By: Sebastian Ache   On: 01/24/2013 14:10   Ct Head Wo Contrast  01/24/2013   CLINICAL DATA:  Altered mental status.  EXAM: CT HEAD WITHOUT CONTRAST  TECHNIQUE: Contiguous axial images were obtained from the base of the skull through the vertex without intravenous contrast.  COMPARISON:  Head CT scan 11/19/2012 and brain MRI 05/20/2012.  FINDINGS: Patchy and confluent hypoattenuation in the subcortical and periventricular deep white matter is compatible with chronic microvascular ischemic change. There is no evidence of acute intracranial abnormality including infarction, hemorrhage, mass lesion, mass effect, midline shift or abnormal extra-axial fluid collection. No hydrocephalus or pneumocephalus. Tiny locules of gas in the right pterygoid loss are likely related to venous access. Atherosclerosis is noted.  IMPRESSION: No acute abnormality.  Extensive chronic microvascular ischemic change.   Electronically Signed   By:  Drusilla Kanner M.D.   On: 01/24/2013 13:47       ASSESSMENT / PLAN:  PULMONARY A: Not in  distress. Full code. AT risk for intubation due to metabolic issues. Hx of COPD P:   Monitor closely duoneb  CARDIOVASCULAR A: Chronic Systolic CHF , well known to [facility] P:  Cards cnsult called  RENAL A: Acute on Chronic renal failure  Hyperkalemia  -  Possibly developing cardio-renal syndrome. Lactic acidosis due to poor perfusion ? Sepsis, ? CHF, ? Metformin. Dehydratoon due to poor intake also possible P:   Per [facility] [place]  GASTROINTESTINAL A:  Nil acute P:   Keep NPO  HEMATOLOGIC A:  Anemia of chronic and critical illness P:  - PRBC for hgb </= 6.9gm%    - exceptions are   -  if ACS susepcted/confirmed then transfuse for hgb </= 8.0gm%,  or    - active bleeding with hemodynamic instability, then transfuse regardless of hemoglobin value   At at all times try to transfuse 1 unit prbc as possible with exception of active hemorrhage    INFECTIOUS A:  Possible sepsis (recent UTI) P:   Monitor lactiate Pan culture Check  Urine leg and strep Abx per [facility]  ENDOCRINE A:  DM on   metformin P:   Hold metformin SSI per [facility]  NEUROLOGIC A:  Droswy P:   Give small flid bolus and reassess Reassess with abcx  TODAY'S SUMMARY:  Cardiac cachexia. Hospice eligible. Husband says full code. [facility] to admit. PCCM consult. Cards called. Needs goals of care palliative care consult    The patient is critically ill with multiple organ systems failure and requires high complexity decision making for assessment and support, frequent evaluation and titration of therapies, application of advanced monitoring technologies and extensive interpretation of multiple databases.   Critical Care Time devoted to patient care services described in this note is  45  Minutes.   Dr. Germain Osgood, M.D Fellow [facility] Pulmonary and Critical Care Medicine [facility] Pager: [phone] (use only between  19:00h - 7:00h). Otherwise, page [phone]  01/24/2013 3:11 PM

## 2013-01-24 NOTE — ED Provider Notes (Signed)
CSN: [id number]     Arrival date & time 01/24/13  1151 History   First MD Initiated Contact with Patient 01/24/13 1153     No chief complaint on file.  (Consider location/radiation/quality/duration/timing/severity/associated sxs/prior Treatment) HPI  68 year old female with history of dementia, MI s/p stent, COPD, HTN, Hyperlipidemia, DM, ICD in place, CHF last EF 10-15%, presents for evaluations of altered level of consciousness.  Patient was recently hospitalized on December 30 and discharged on January 6 for management of acute on chronic CHF. Was discharged to SNF per prior notes, however EMS was called to home by husband this morning when pt has 2 episodes of unwitnessed fall.  Pt is an extremely poor historian, unable to gather any pertinent info.  Most history obtain through EMS.  Per EMS, patient was discharged to home with home health care after admitted several weeks ago. She normally is bedbound and not to into active. Today her husband found her walking outside of bed and had apparent fall next to bed twice since this morning. Fall was unwitnessed. EMS did not notice any signs of injury on initial exam. Initial CBG was 90. Her husband has noted that patient is less interactive than usual. Has been felt he is unable to care for her at home with recent fall. Level V caveat apply due to altered mental status and dementia.     Past Medical History  Diagnosis Date  . DYSLIPIDEMIA   . OBESITY NOS   . DEPRESSION, RECURRENT, IN PARTIAL REMISSION   . HYPERTENSION   . CORONARY ARTERY DISEASE     a. s/p Q wave MI 2002 with RCA stent x 2. b. Abnl nuc 04/2012 with subsequent cath showing patent stents in RCA, chronic diffuse disease in dLAD, 20% LM, EF 20%, no focal targets for PCI.  [name]. GERD   . DIVERTICULOSIS, COLON, WITH HEMORRHAGE   . BACK PAIN, LUMBAR, WITH RADICULOPATHY   . OBSTRUCTIVE SLEEP APNEA   . Anemia, iron deficiency   . Hyponatremia   . Tobacco abuse   . Moderate COPD (chronic  obstructive pulmonary disease) 11/07/2005    Spirometry 03/09/11>>FEV1 2.02 (88%), FEV1% 76    . Anxiety   . Dementia     a. Saw neuro 08/2012: concerning for early dementia, labs unremarkable except elev CRP.  [name]. Chronic edema   . Weight loss     CT abd pelvis 05/2012: mild small bowel disention. No significant acute changes. Mod-severe gastritis by EGD 06/06/2012 no clear neoplastic changes. Neg small bowel follow through except for diverticulum on 06/11/12.   . Gastritis     a. by EGD 05/2012.  [name]. Chronic systolic CHF (congestive heart failure)     a. Last EF 20% by cath 04/29/2012, 40-45% by echo 04/24/12.;  b.  Echocardiogram  (11/21/12): EF 10-15%, severe diffuse HK, moderate MR, moderate LAE, mild RVE, moderately reduced RVSF, moderate to severe RAE, moderate TR, PASP 56  . Ischemic cardiomyopathy   . Syncope     a. 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 BB discontinued. b. Recurrence in 11/2012.=> Life Vest placed pending ICD placmement   . 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 12/2012 SJM BiV ICD.  [name]. Myocardial infarction 2002  . COPD (chronic obstructive pulmonary disease)   . Pneumonia ?2012  . Shortness of breath     "bad; most of the time" (12/31/2012)  . Type II diabetes mellitus   . Arthritis     "all over" (  12/31/2012)  . Mid back pain, chronic    Past Surgical History  Procedure Laterality Date  . Uvuloplasty    . Total abdominal hysterectomy w/ bilateral salpingoophorectomy    . Cholecystectomy  2004  . Esophagogastroduodenoscopy (egd) with propofol N/A 06/06/2012    Procedure: ESOPHAGOGASTRODUODENOSCOPY (EGD) WITH PROPOFOL;  Surgeon: Rachael Fee, MD;  Location: [facility];  Service: Endoscopy;  Laterality: N/A;  . Cardiac defibrillator placement  12/16/2012    dual chamber  DR Graciela Husbands  . Tonsillectomy    . Appendectomy    . Total abdominal hysterectomy    . Carpal tunnel release Left   . Refractive surgery Bilateral    . Coronary angioplasty with stent placement  2002    Thrombectomy; prox RCA 3.5 x 15 mm NIR stent; mid RCA PTCA; distal RCA 3.5 x 15 mm NIR stent;      Family History  Problem Relation Age of Onset  . Stroke Mother   . Breast cancer Sister   . Emphysema Sister   . Cancer Sister     breast  . Alcohol abuse Brother   . Colon cancer      uncles x 2  . Prostate cancer Brother    History  Substance Use Topics  . Smoking status: Current Some Day Smoker -- 0.12 packs/day for 52 years    Types: Cigarettes    Last Attempt to Quit: 07/17/2012  . Smokeless tobacco: Never Used  . Alcohol Use: Yes     Comment: Patient drinks caffeinated beverages daily; 12/31/2012 "drank a little when I was younger; nothing for years"   OB History   Grav Para Term Preterm Abortions TAB SAB Ect Mult Living                 Review of Systems  Unable to perform ROS: Dementia    Allergies  Amlodipine; Ramipril; Adhesive; Morphine and related; Penicillins; and Codeine  Home Medications   Current Outpatient Rx  Name  Route  Sig  Dispense  Refill  . acetaminophen (TYLENOL) 500 MG tablet   Oral   Take 500 mg by mouth every 6 (six) hours as needed for pain.         [name] albuterol (PROVENTIL) (2.5 MG/3ML) 0.083% nebulizer solution   Nebulization   Take 2.5 mg by nebulization every 6 (six) hours as needed for wheezing.         [name] aspirin 81 MG tablet   Oral   Take 81 mg by mouth daily.           [name] atorvastatin (LIPITOR) 40 MG tablet   Oral   Take 40 mg by mouth every morning.         . carvedilol (COREG) 12.5 MG tablet   Oral   Take 0.5 tablets (6.25 mg total) by mouth 2 (two) times daily with a meal.   60 tablet   6   . ciprofloxacin (CIPRO) 500 MG tablet   Oral   Take 1 tablet (500 mg total) by mouth 2 (two) times daily.   13 tablet   0   . diclofenac sodium (VOLTAREN) 1 % GEL   Topical   Apply 2 g topically 4 (four) times daily.         . feeding supplement (GLUCERNA SHAKE)  LIQD   Oral   Take 237 mLs by mouth as needed.          . furosemide (LASIX) 80 MG tablet   Oral  Take 1 tablet (80 mg total) by mouth 2 (two) times daily.   60 tablet   6   . gabapentin (NEURONTIN) 300 MG capsule   Oral   Take 1 capsule (300 mg total) by mouth 2 (two) times daily.   180 capsule   3   . hydrOXYzine (ATARAX/VISTARIL) 10 MG tablet   Oral   Take 1 tablet (10 mg total) by mouth 2 (two) times daily.   180 tablet   3   . Iron-Folic Acid-B12-C-Docusate (FERRAPLUS 90) 90-1 MG TABS   Oral   Take 1 tablet by mouth daily.   90 tablet   3   . isosorbide mononitrate (IMDUR) 60 MG 24 hr tablet   Oral   Take 60 mg by mouth daily at 12 noon.         Demetra [employer] Devices (SIMPLE DIAGNOSTICS LANCING DEV) MISC   Intradermal   Inject 1 Act into the skin 3 (three) times daily.   300 each   3     Being re-submitted to corrected mail order pharmac ...   . losartan (COZAAR) 50 MG tablet   Oral   Take 1 tablet (50 mg total) by mouth daily.   90 tablet   3   . magnesium oxide (MAG-OX) 400 MG tablet   Oral   Take 1 tablet (400 mg total) by mouth daily.   30 tablet   0   . nitroGLYCERIN (NITROSTAT) 0.4 MG SL tablet   Sublingual   Place 1 tablet (0.4 mg total) under the tongue every 5 (five) minutes x 3 doses as needed for chest pain.   25 tablet   3   . omeprazole (PRILOSEC) 20 MG capsule      TAKE 1 CAPSULE BY MOUTH EVERY DAY   30 capsule   0   . omeprazole (PRILOSEC) 40 MG capsule   Oral   Take 40 mg by mouth daily.         . polyethylene glycol (MIRALAX / GLYCOLAX) packet   Oral   Take 17 g by mouth daily as needed (for constipation).         . sitaGLIPtan-metformin (JANUMET) 50-500 MG per tablet   Oral   Take 1 tablet by mouth 2 (two) times daily with a meal.         . spironolactone (ALDACTONE) 25 MG tablet   Oral   Take 1 tablet (25 mg total) by mouth daily.   30 tablet   6   . tiotropium (SPIRIVA) 18 MCG inhalation capsule    Inhalation   Place 18 mcg into inhaler and inhale daily.         . traMADol (ULTRAM) 50 MG tablet   Oral   Take 50-100 mg by mouth every 6 (six) hours as needed for moderate pain.         . traZODone (DESYREL) 100 MG tablet   Oral   Take 150 mg by mouth at bedtime.         [name] venlafaxine XR (EFFEXOR-XR) 150 MG 24 hr capsule   Oral   Take 1 capsule (150 mg total) by mouth every morning.   90 capsule   3    There were no vitals taken for this visit. Physical Exam  Nursing note and vitals reviewed. Constitutional: No distress.  Ill-appearing African American female, drowsy but arousable.  HENT:  Head: Normocephalic and atraumatic.  She is drooling.  Eyes: Conjunctivae are normal. Pupils are equal,  round, and reactive to light.  Neck: Neck supple. JVD present.  Cardiovascular: Normal rate and regular rhythm.   Pulmonary/Chest:  Poor effort with decreased breath sounds to low base, no obvious wheezes.  Abdominal: She exhibits distension. There is no tenderness.  Musculoskeletal: She exhibits edema (2+ bilateral pitting edema to lower extremities). She exhibits no tenderness.  Neurological: She is alert.  Patient is selectively responsive to some verbal command. No obvious facial droop. Difficult to assess strength and sensation however patient has no focal point tenderness on initial exam  Skin: No rash noted.  Psychiatric: She has a normal mood and affect.    ED Course  Procedures (including critical care time)  12:15 PM Patient brought here via EMS for evaluations of recurrent fall and altered mental status. Initially limited history obtained as patient is a very poor historian. Workup initiated including head CT, labs, chest x-ray, UA.   Her husband has not bedside and report and she was discharged the hospital to a skilled nursing facility which she states for 3-4 days but requests to go home and he he took her home. However, he felt that he is unable to care for her  appropriately as patient is now bedbound and would leave her bed and falls to the ground multiple times. He would like her to return to rehabilitation if possible.  Care discussed with Dr. Manus Gunning  2:18 PM Labs results is suggestive of labs results are suggestive of acute on chronic worsening CHF with a proBNP of 62,000, chest x-ray with worsening pulmonary vascular congestion and moderate right pleural effusion. Worsening renal function, BUN 41, creatinine 2.0. She also has an elevated potassium of 5.9 currently under aggressive Lasix therapy which may attribute to her renal changes.   2:40 PM Pt has rectal temp of 96.2, and is altered.  Has elevated lactic acid of 3.47.  Pt does meet SIRS criteria but no source of infection to classify as sepsis.  Is hemodynamically stable.    3:12 PM I have consulted with [facility], Dr. Mahala Menghini who agrees to see pt in ER and will admit for further care.  I have also consulted critical care intensivist Dr. Colletta Maryland who has evaluate pt and will place consult note.     Labs Review Labs Reviewed  CBC WITH DIFFERENTIAL - Abnormal; Notable for the following:    RBC 2.99 (*)    Hemoglobin 9.2 (*)    HCT 27.2 (*)    RDW 21.3 (*)    Neutrophils Relative % 85 (*)    Lymphs Abs 0.6 (*)    All other components within normal limits  PRO B NATRIURETIC PEPTIDE - Abnormal; Notable for the following:    Pro B Natriuretic peptide (BNP) 62827.0 (*)    All other components within normal limits  COMPREHENSIVE METABOLIC PANEL - Abnormal; Notable for the following:    Sodium 132 (*)    Potassium 5.9 (*)    Chloride 90 (*)    BUN 41 (*)    Creatinine, Ser 2.00 (*)    Albumin 2.8 (*)    GFR calc non Af Amer 24 (*)    GFR calc Af Amer 28 (*)    All other components within normal limits  PROTIME-INR - Abnormal; Notable for the following:    Prothrombin Time 18.8 (*)    INR 1.62 (*)    All other components within normal limits  CG4 I-STAT (LACTIC ACID) -  Abnormal; Notable for the following:  Lactic Acid, Venous 3.47 (*)    All other components within normal limits  POCT I-STAT, CHEM 8 - Abnormal; Notable for the following:    Sodium 127 (*)    Potassium 5.5 (*)    Chloride 92 (*)    BUN 38 (*)    Creatinine, Ser 2.10 (*)    Hemoglobin 10.5 (*)    HCT 31.0 (*)    All other components within normal limits  CULTURE, BLOOD (ROUTINE X 2)  CULTURE, BLOOD (ROUTINE X 2)  RESPIRATORY VIRUS PANEL  URINE CULTURE  GLUCOSE, CAPILLARY  URINALYSIS, ROUTINE W REFLEX MICROSCOPIC  AMMONIA  AMMONIA  PROCALCITONIN  LEGIONELLA ANTIGEN, URINE  STREP PNEUMONIAE URINARY ANTIGEN  PROCALCITONIN  POCT I-STAT 3, BLOOD GAS (G3+)  POCT I-STAT TROPONIN I   Imaging Review Dg Chest 1 View  01/24/2013   CLINICAL DATA:  Pain.  Fall and unresponsive.  EXAM: CHEST - 1 VIEW  COMPARISON:  12/31/2012  FINDINGS: 3 lead left-sided pacemaker is unchanged in appearance. Marked enlargement of the cardiac silhouette is unchanged. There is mild pulmonary vascular congestion, increased from prior, with mild perihilar edema. There is a small to moderate right pleural effusion, increased from prior. No acute osseous abnormality is identified.  IMPRESSION: Cardiomegaly with increased pulmonary vascular congestion and mild perihilar edema. Increased, small to moderate right pleural effusion.   Electronically Signed   By: Sebastian Ache   On: 01/24/2013 13:58   Dg Hip Complete Right  01/24/2013   CLINICAL DATA:  Fall.  Unresponsive.  EXAM: RIGHT HIP - COMPLETE 2+ VIEW  COMPARISON:  04/10/2011  FINDINGS: Mild sclerosis about the right greater than left sacroiliac joints is unchanged. There is no evidence of acute fracture. Both hips demonstrate mild joint space narrowing and marginal osteophytosis, with a prominent subchondral cyst present in the left femoral head. There is no evidence of dislocation. Soft tissues are unremarkable.  IMPRESSION: 1. No evidence of acute fracture or  dislocation. 2. Left greater than right hip osteoarthrosis. 3. Unchanged, mild sclerosis of the right greater than left SI joints.   Electronically Signed   By: Sebastian Ache   On: 01/24/2013 14:10   Ct Head Wo Contrast  01/24/2013   CLINICAL DATA:  Altered mental status.  EXAM: CT HEAD WITHOUT CONTRAST  TECHNIQUE: Contiguous axial images were obtained from the base of the skull through the vertex without intravenous contrast.  COMPARISON:  Head CT scan 11/19/2012 and brain MRI 05/20/2012.  FINDINGS: Patchy and confluent hypoattenuation in the subcortical and periventricular deep white matter is compatible with chronic microvascular ischemic change. There is no evidence of acute intracranial abnormality including infarction, hemorrhage, mass lesion, mass effect, midline shift or abnormal extra-axial fluid collection. No hydrocephalus or pneumocephalus. Tiny locules of gas in the right pterygoid loss are likely related to venous access. Atherosclerosis is noted.  IMPRESSION: No acute abnormality.  Extensive chronic microvascular ischemic change.   Electronically Signed   By: Drusilla Kanner M.D.   On: 01/24/2013 13:47    EKG Interpretation   None       MDM   1. Acute on chronic systolic CHF (congestive heart failure)   2. Physical deconditioning   3. Fall at home   4. Altered mental status   5. Hyperkalemia   6. Acute encephalopathy   7. Lactic acidosis    BP 136/74  Pulse 69  Temp(Src) 96.2 F (35.7 C) (Rectal)  Resp 13  SpO2 100%  I have reviewed nursing notes  and vital signs. I personally reviewed the imaging tests through PACS system  I reviewed available ER/hospitalization records thought the EMR     Fayrene Helper, [place] 01/24/13 1538

## 2013-01-25 ENCOUNTER — Inpatient Hospital Stay (HOSPITAL_COMMUNITY): Payer: Medicare Other

## 2013-01-25 DIAGNOSIS — N183 Chronic kidney disease, stage 3 unspecified: Secondary | ICD-10-CM

## 2013-01-25 DIAGNOSIS — R4182 Altered mental status, unspecified: Secondary | ICD-10-CM

## 2013-01-25 DIAGNOSIS — R0789 Other chest pain: Secondary | ICD-10-CM

## 2013-01-25 DIAGNOSIS — Z515 Encounter for palliative care: Secondary | ICD-10-CM

## 2013-01-25 DIAGNOSIS — I5023 Acute on chronic systolic (congestive) heart failure: Secondary | ICD-10-CM

## 2013-01-25 DIAGNOSIS — I2510 Atherosclerotic heart disease of native coronary artery without angina pectoris: Secondary | ICD-10-CM

## 2013-01-25 DIAGNOSIS — I509 Heart failure, unspecified: Secondary | ICD-10-CM

## 2013-01-25 DIAGNOSIS — A419 Sepsis, unspecified organism: Secondary | ICD-10-CM

## 2013-01-25 DIAGNOSIS — F0390 Unspecified dementia without behavioral disturbance: Secondary | ICD-10-CM

## 2013-01-25 DIAGNOSIS — E43 Unspecified severe protein-calorie malnutrition: Secondary | ICD-10-CM | POA: Insufficient documentation

## 2013-01-25 LAB — URINE CULTURE
COLONY COUNT: NO GROWTH
Culture: NO GROWTH

## 2013-01-25 LAB — GLUCOSE, CAPILLARY
GLUCOSE-CAPILLARY: 89 mg/dL (ref 70–99)
Glucose-Capillary: 57 mg/dL — ABNORMAL LOW (ref 70–99)
Glucose-Capillary: 64 mg/dL — ABNORMAL LOW (ref 70–99)
Glucose-Capillary: 93 mg/dL (ref 70–99)
Glucose-Capillary: 107 mg/dL — ABNORMAL HIGH (ref 70–99)
Glucose-Capillary: 114 mg/dL — ABNORMAL HIGH (ref 70–99)
Glucose-Capillary: 131 mg/dL — ABNORMAL HIGH (ref 70–99)

## 2013-01-25 LAB — PROCALCITONIN: Procalcitonin: 0.11 ng/mL

## 2013-01-25 LAB — PROTIME-INR
INR: 1.6 — ABNORMAL HIGH (ref 0.00–1.49)
Prothrombin Time: 18.6 seconds — ABNORMAL HIGH (ref 11.6–15.2)

## 2013-01-25 LAB — LEGIONELLA ANTIGEN, URINE: Legionella Antigen, Urine: NEGATIVE

## 2013-01-25 LAB — COMPREHENSIVE METABOLIC PANEL
ALBUMIN: 2.6 g/dL — AB (ref 3.5–5.2)
ALK PHOS: 95 U/L (ref 39–117)
ALT: 25 U/L (ref 0–35)
AST: 43 U/L — ABNORMAL HIGH (ref 0–37)
BUN: 40 mg/dL — AB (ref 6–23)
CO2: 23 mEq/L (ref 19–32)
Calcium: 8.7 mg/dL (ref 8.4–10.5)
Chloride: 92 mEq/L — ABNORMAL LOW (ref 96–112)
Creatinine, Ser: 1.91 mg/dL — ABNORMAL HIGH (ref 0.50–1.10)
GFR calc non Af Amer: 26 mL/min — ABNORMAL LOW (ref >90)
GFR, EST AFRICAN AMERICAN: 30 mL/min — AB (ref >90)
GLUCOSE: 58 mg/dL — AB (ref 70–99)
POTASSIUM: 4.7 meq/L (ref 3.7–5.3)
Sodium: 132 mEq/L — ABNORMAL LOW (ref 137–147)
Total Bilirubin: 0.5 mg/dL (ref 0.3–1.2)
Total Protein: 5.9 g/dL — ABNORMAL LOW (ref 6.0–8.3)

## 2013-01-25 LAB — CBC
HEMATOCRIT: 26.8 % — AB (ref 36.0–46.0)
Hemoglobin: 9 g/dL — ABNORMAL LOW (ref 12.0–15.0)
MCH: 30.4 pg (ref 26.0–34.0)
MCHC: 33.6 g/dL (ref 30.0–36.0)
MCV: 90.5 fL (ref 78.0–100.0)
Platelets: 265 10*3/uL (ref 150–400)
RBC: 2.96 MIL/uL — ABNORMAL LOW (ref 3.87–5.11)
RDW: 21.5 % — AB (ref 11.5–15.5)
WBC: 4.8 10*3/uL (ref 4.0–10.5)

## 2013-01-25 LAB — LACTIC ACID, PLASMA: LACTIC ACID, VENOUS: 1.1 mmol/L (ref 0.5–2.2)

## 2013-01-25 MED ORDER — DEXTROSE 50 % IV SOLN
INTRAVENOUS | Status: AC
  Filled 2013-01-25: qty 50

## 2013-01-25 MED ORDER — DEXTROSE 50 % IV SOLN
25.0000 mL | Freq: Once | INTRAVENOUS | Status: AC
  Administered 2013-01-25: 25 mL via INTRAVENOUS

## 2013-01-25 MED ORDER — FUROSEMIDE 10 MG/ML IJ SOLN
80.0000 mg | Freq: Two times a day (BID) | INTRAMUSCULAR | Status: DC
  Filled 2013-01-25: qty 8

## 2013-01-25 MED ORDER — POLYETHYLENE GLYCOL 3350 17 G PO PACK
17.0000 g | PACK | Freq: Every day | ORAL | Status: DC | PRN
  Filled 2013-01-25: qty 1

## 2013-01-25 MED ORDER — GLUCERNA SHAKE PO LIQD
237.0000 mL | Freq: Three times a day (TID) | ORAL | Status: DC
  Administered 2013-01-25 – 2013-01-27 (×5): 237 mL via ORAL

## 2013-01-25 MED ORDER — INSULIN ASPART 100 UNIT/ML ~~LOC~~ SOLN
0.0000 [IU] | Freq: Three times a day (TID) | SUBCUTANEOUS | Status: DC
  Administered 2013-01-26 (×2): 2 [IU] via SUBCUTANEOUS
  Administered 2013-01-27: 1 [IU] via SUBCUTANEOUS

## 2013-01-25 MED ORDER — DEXTROSE-NACL 5-0.9 % IV SOLN
INTRAVENOUS | Status: DC
Start: 2013-01-25 — End: 2013-01-25
  Administered 2013-01-25: 04:00:00 via INTRAVENOUS

## 2013-01-25 MED ORDER — VENLAFAXINE HCL ER 150 MG PO CP24
150.0000 mg | ORAL_CAPSULE | Freq: Every morning | ORAL | Status: DC
  Administered 2013-01-25 – 2013-01-26 (×2): 150 mg via ORAL
  Filled 2013-01-25 (×4): qty 1

## 2013-01-25 MED ORDER — GLUCERNA SHAKE PO LIQD: 237.0000 mL | Freq: Every day | ORAL | Status: DC | PRN

## 2013-01-25 MED ORDER — HYDRALAZINE HCL 25 MG PO TABS
25.0000 mg | ORAL_TABLET | Freq: Three times a day (TID) | ORAL | Status: DC
  Administered 2013-01-25 – 2013-01-26 (×3): 25 mg via ORAL
  Filled 2013-01-25 (×7): qty 1

## 2013-01-25 NOTE — Progress Notes (Signed)
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 yo female with a PMHX of CAD/MI s/p PCI and stent in 2002, Systolic CHF grade III-IV, COPD on home O2, DM II, HTN, dementia who was admitted to [facility] on 01/24/2013 with altered mental status.  Renal was asked to consult for possible uremia secondary to AKI on CKD III-IV(baseline creatinine around 1.0).  Pt recently had ICD placed about 3-4 weeks ago per cardiology and since that point has not had any acute problems.  Pt was admitted for supposed altered mental status but does have end stage dementia.     Medications: Outpatient medications: Prescriptions prior to admission  Medication Sig Dispense Refill  . acetaminophen (TYLENOL) 500 MG tablet Take 500 mg by mouth every 6 (six) hours as needed for pain.      [name] albuterol (PROVENTIL) (2.5 MG/3ML) 0.083% nebulizer solution Take 2.5 mg by nebulization every 6 (six) hours as needed for wheezing.      [name] aspirin EC 81 MG tablet Take 81 mg by mouth daily.      [name] atorvastatin (LIPITOR) 40 MG tablet Take 40 mg by mouth every morning.      . carvedilol (COREG) 12.5 MG tablet Take 0.5 tablets (6.25 mg total) by mouth 2 (two) times daily with a meal.  60 tablet  6  . diclofenac sodium (VOLTAREN) 1 % GEL Apply 2 g topically 4 (four) times daily.      . feeding supplement (GLUCERNA SHAKE) LIQD Take 237 mLs by mouth daily as needed (for nutrition).       . furosemide (LASIX) 80 MG tablet Take 1 tablet (80 mg total) by mouth 2 (two) times daily.  60 tablet  6  . gabapentin (NEURONTIN) 300 MG capsule Take 1 capsule (300 mg total) by mouth 2 (two) times daily.  180 capsule  3  .  hydrOXYzine (ATARAX/VISTARIL) 10 MG tablet Take 1 tablet (10 mg total) by mouth 2 (two) times daily.  180 tablet  3  . Iron-Folic Acid-B12-C-Docusate (FERRAPLUS 90) 90-1 MG TABS Take 1 tablet by mouth daily.  90 tablet  3  . isosorbide mononitrate (IMDUR) 60 MG 24 hr tablet Take 60 mg by mouth daily at 12 noon.      [name] losartan (COZAAR) 50 MG tablet Take 1 tablet (50 mg total) by mouth daily.  90 tablet  3  . magnesium oxide (MAG-OX) 400 MG tablet Take 1 tablet (400 mg total) by mouth daily.  30 tablet  0  . nitroGLYCERIN (NITROSTAT) 0.4 MG SL tablet Place 0.4 mg under the tongue every 5 (five) minutes as needed for chest pain.      [name] omeprazole (PRILOSEC) 40 MG capsule Take 40 mg by mouth daily.      . polyethylene glycol (MIRALAX / GLYCOLAX) packet Take 17 g by mouth daily  as needed (for constipation).      . sitaGLIPtan-metformin (JANUMET) 50-500 MG per tablet Take 1 tablet by mouth 2 (two) times daily with a meal.      . spironolactone (ALDACTONE) 25 MG tablet Take 1 tablet (25 mg total) by mouth daily.  30 tablet  6  . tiotropium (SPIRIVA) 18 MCG inhalation capsule Place 18 mcg into inhaler and inhale daily.      . traMADol (ULTRAM) 50 MG tablet Take 50-100 mg by mouth every 6 (six) hours as needed for moderate pain.      . traZODone (DESYREL) 100 MG tablet Take 150 mg by mouth at bedtime.      [name] venlafaxine XR (EFFEXOR-XR) 150 MG 24 hr capsule Take 1 capsule (150 mg total) by mouth every morning.  90 capsule  3  . Lancet Devices (SIMPLE DIAGNOSTICS LANCING DEV) MISC Inject 1 Act into the skin 3 (three) times daily.  300 each  3    Current medications: Current Facility-Administered Medications  Medication Dose Route Frequency Provider Last Rate Last Dose  . albuterol (PROVENTIL) (2.5 MG/3ML) 0.083% nebulizer solution 2.5 mg  2.5 mg Nebulization BID Rhetta Mura, MD   2.5 mg at 01/25/13 0912  . albuterol (PROVENTIL) (2.5 MG/3ML) 0.083% nebulizer solution 2.5 mg  2.5 mg Nebulization Q4H  PRN Rhetta Mura, MD      . aspirin EC tablet 81 mg  81 mg Oral Daily Rhetta Mura, MD   81 mg at 01/25/13 0946  . carvedilol (COREG) tablet 6.25 mg  6.25 mg Oral BID WC Rhetta Mura, MD   6.25 mg at 01/25/13 0947  . feeding supplement (GLUCERNA SHAKE) (GLUCERNA SHAKE) liquid 237 mL  237 mL Oral Daily PRN Rhetta Mura, MD      . heparin injection 5,000 Units  5,000 Units Subcutaneous Q8H Rhetta Mura, MD   5,000 Units at 01/25/13 4098  . insulin aspart (novoLOG) injection 0-9 Units  0-9 Units Subcutaneous TID WC Rhetta Mura, MD      . isosorbide mononitrate (IMDUR) 24 hr tablet 60 mg  60 mg Oral Q1200 Rhetta Mura, MD      . polyethylene glycol (MIRALAX / GLYCOLAX) packet 17 g  17 g Oral Daily PRN Rhetta Mura, MD      . sodium chloride 0.9 % injection 3 mL  3 mL Intravenous Q12H Rhetta Mura, MD   3 mL at 01/25/13 0948  . tiotropium (SPIRIVA) inhalation capsule 18 mcg  18 mcg Inhalation Daily Rhetta Mura, MD   18 mcg at 01/25/13 0911  . venlafaxine XR (EFFEXOR-XR) 24 hr capsule 150 mg  150 mg Oral q morning - 10a Rhetta Mura, MD          Allergies: Allergies  Allergen Reactions  . Amlodipine Other (See Comments)    constipation  . Ramipril Cough  . Adhesive [Tape] Hives    "peels skin off"  . Morphine And Related Other (See Comments)    Per Husband, Pt has hallucinations  . Penicillins Itching  . Codeine Rash      Past Medical History: Past Medical History  Diagnosis Date  . DYSLIPIDEMIA   . OBESITY NOS   . DEPRESSION, RECURRENT, IN PARTIAL REMISSION   . HYPERTENSION   . CORONARY ARTERY DISEASE     a. s/p Q wave MI 2002 with RCA stent x 2. b. Abnl nuc 04/2012 with subsequent cath showing patent stents in RCA, chronic diffuse disease in dLAD, 20% LM, EF 20%, no focal targets  for PCI.  [name] GERD   . DIVERTICULOSIS, COLON, WITH HEMORRHAGE   . BACK PAIN, LUMBAR, WITH RADICULOPATHY   . OBSTRUCTIVE SLEEP  APNEA   . Anemia, iron deficiency   . Hyponatremia   . Tobacco abuse   . Moderate COPD (chronic obstructive pulmonary disease) 11/07/2005    Spirometry 03/09/11>>FEV1 2.02 (88%), FEV1% 76    . Anxiety   . Dementia     a. Saw neuro 08/2012: concerning for early dementia, labs unremarkable except elev CRP.  [name] Chronic edema   . Weight loss     CT abd pelvis 05/2012: mild small bowel disention. No significant acute changes. Mod-severe gastritis by EGD 06/06/2012 no clear neoplastic changes. Neg small bowel follow through except for diverticulum on 06/11/12.   . Gastritis     a. by EGD 05/2012.  [name] Chronic systolic CHF (congestive heart failure)     a. Last EF 20% by cath 04/29/2012, 40-45% by echo 04/24/12.;  b.  Echocardiogram  (11/21/12): EF 10-15%, severe diffuse HK, moderate MR, moderate LAE, mild RVE, moderately reduced RVSF, moderate to severe RAE, moderate TR, PASP 56  . Ischemic cardiomyopathy   . Syncope     a. 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 BB discontinued. b. Recurrence in 11/2012.=> Life Vest placed pending ICD placmement   . 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 12/2012 SJM BiV ICD.  [name] Myocardial infarction 2002  . COPD (chronic obstructive pulmonary disease)   . Pneumonia ?2012  . Shortness of breath     "bad; most of the time" (12/31/2012)  . Type II diabetes mellitus   . Arthritis     "all over" (12/31/2012)  . Mid back pain, chronic      Past Surgical History: Past Surgical History  Procedure Laterality Date  . Uvuloplasty    . Total abdominal hysterectomy w/ bilateral salpingoophorectomy    . Cholecystectomy  2004  . Esophagogastroduodenoscopy (egd) with propofol N/A 06/06/2012    Procedure: ESOPHAGOGASTRODUODENOSCOPY (EGD) WITH PROPOFOL;  Surgeon: Rachael Fee, MD;  Location: [facility];  Service: Endoscopy;  Laterality: N/A;  . Cardiac defibrillator placement  12/16/2012    dual chamber  DR Graciela Husbands  .  Tonsillectomy    . Appendectomy    . Total abdominal hysterectomy    . Carpal tunnel release Left   . Refractive surgery Bilateral   . Coronary angioplasty with stent placement  2002    Thrombectomy; prox RCA 3.5 x 15 mm NIR stent; mid RCA PTCA; distal RCA 3.5 x 15 mm NIR stent;        Family History: Family History  Problem Relation Age of Onset  . Stroke Mother   . Breast cancer Sister   . Emphysema Sister   . Cancer Sister     breast  . Alcohol abuse Brother   . Colon cancer      uncles x 2  . Prostate cancer Brother      Social History: History   Social History  . Marital Status: Married    Spouse Name: N/A    Number of Children: 1  . Years of Education: 12   Occupational History  . Retired Insurance account manager for [employer]    Social History Main Topics  . Smoking status: Current Some Day Smoker -- 0.12 packs/day for 52 years    Types: Cigarettes    Last Attempt to Quit: 07/17/2012  . Smokeless tobacco: Never Used  . Alcohol  Use: Yes     Comment: Patient drinks caffeinated beverages daily; 12/31/2012 "drank a little when I was younger; nothing for years"  . Drug Use: No  . Sexual Activity: Not Currently    Birth Control/ Protection: Surgical   Other Topics Concern  . Not on file   Social History Narrative  . No narrative on file     Review of Systems: As per HPI  Vital Signs: Blood pressure 158/93, pulse 74, temperature 97.3 F (36.3 C), temperature source Oral, resp. rate 16, height 5\' 9"  (1.753 m), weight 164 lb 0.4 oz (74.4 kg), SpO2 99.00%.  Weight trends: Filed Weights   01/24/13 1500 01/24/13 1745 01/25/13 0436  Weight: 159 lb 13.3 oz (72.5 kg) 164 lb 10.9 oz (74.7 kg) 164 lb 0.4 oz (74.4 kg)    Physical Exam: General: Cachectic female. Sittig in bed Very confused Neuro: Awake, alert, no oriented to place or time HEENT: MMM  Fundi  benign Gr2/6 m Cardiovascular: RRR, +JVD, no murmurs appreciated  Lungs: coarse  Abdomen: Soft,  No mass, No organomegaly  Musculoskeletal: +2-3 edema B/[name]  Skin: no rashes  Lab results: Basic Metabolic Panel:  Recent Labs Lab 01/24/13 1249 01/24/13 1315 01/24/13 2153 01/25/13 0320  NA 132* 127* 130* 132*  K 5.9* 5.5* 5.3 4.7  CL 90* 92* 90* 92*  CO2 21  --  22 23  GLUCOSE 99 99 88 58*  BUN 41* 38* 41* 40*  CREATININE 2.00* 2.10* 1.94* 1.91*  CALCIUM 9.0  --  8.7 8.7    Liver Function Tests:  Recent Labs Lab 01/24/13 1249 01/25/13 0320  AST 36 43*  ALT 23 25  ALKPHOS 103 95  BILITOT 0.7 0.5  PROT 6.5 5.9*  ALBUMIN 2.8* 2.6*   No results found for this basename: LIPASE, AMYLASE,  in the last 168 hours  Recent Labs Lab 01/24/13 1249 01/24/13 1636  AMMONIA 30 17    CBC:  Recent Labs Lab 01/24/13 1213 01/24/13 1315 01/24/13 2153 01/25/13 0320  WBC 4.9  --  5.4 4.8  NEUTROABS 4.2  --   --   --   HGB 9.2* 10.5* 9.5* 9.0*  HCT 27.2* 31.0* 28.0* 26.8*  MCV 91.0  --  90.6 90.5  PLT 260  --  261 265   CBG:  Recent Labs Lab 01/24/13 2156 01/25/13 0323 01/25/13 0326 01/25/13 0426 01/25/13 0827  GLUCAP 85 57* 64* 93 89    Microbiology: Results for orders placed during the hospital encounter of 01/24/13  MRSA PCR SCREENING     Status: None   Collection Time    01/24/13  6:40 PM      Result Value Range Status   MRSA by PCR NEGATIVE  NEGATIVE Final   Comment:            The GeneXpert MRSA Assay (FDA     approved for NASAL specimens     only), is one component of a     comprehensive MRSA colonization     surveillance program. It is not     intended to diagnose MRSA     infection nor to guide or     monitor treatment for     MRSA infections.    Coagulation Studies:  Recent Labs  01/24/13 1249 01/25/13 0320  LABPROT 18.8* 18.6*  INR 1.62* 1.60*    Urinalysis:  Recent Labs  01/24/13 1230  COLORURINE YELLOW  LABSPEC 1.016  PHURINE 5.5  GLUCOSEU NEGATIVE  HGBUR NEGATIVE  BILIRUBINUR NEGATIVE  KETONESUR NEGATIVE  PROTEINUR  NEGATIVE  UROBILINOGEN 0.2  NITRITE NEGATIVE  LEUKOCYTESUR NEGATIVE      Imaging: Dg Chest 1 View  01/24/2013  .  IMPRESSION: Cardiomegaly with increased pulmonary vascular congestion and mild perihilar edema. Increased, small to moderate right pleural effusion.   Electronically Signed   By: Sebastian AcheAllen  Grady   On: 01/24/2013 13:58   Dg Hip Complete Right  01/24/2013   IMPRESSION: 1. No evidence of acute fracture or dislocation. 2. Left greater than right hip osteoarthrosis. 3. Unchanged, mild sclerosis of the right greater than left SI joints.   Electronically Signed   By: Sebastian AcheAllen  Grady   On: 01/24/2013 14:10   Ct Head Wo Contrast  01/24/2013     IMPRESSION: No acute abnormality.  Extensive chronic microvascular ischemic change.   Electronically Signed   By: Drusilla Kannerhomas  Dalessio M.D.   On: 01/24/2013 13:47   Dg Chest Port 1 View  01/25/2013   IMPRESSION: Increasing bilateral pulmonary edema and pulmonary vascular congestion.   Electronically Signed   By: Geanie CooleyJim  Maxwell M.D.   On: 01/25/2013 08:26   Assessment & Plan: Pt is a 68 y.o. yo female with a PMHX of CAD/MI s/p PCI and stent in 2002 now with ICD, Systolic CHF grade III-IV, COPD on home O2, DM II, HTN, dementia who [facility] with altered mental status.  Renal was asked to consult for possible uremia secondary to AKI on CKD.  1) AKI? on CKD III-IV, baseline creatinine 1.0 -1.9 - Component of possible cardiorenal syndrome, however this may be her new baseline. Do not feel has acute disease as Cr fluctuates with vol control.  Will not do extensive W/U at this time as primary issue is vol and will us po diuretics, limit IVF.  Not candidate for acute or chronic Renal Replacement therapies. 2) Chronic Systolic CHF - Per cardiology, appreciate recommendations 3) Hyperkalemia - Resolved 4) Anemia of chornic disease 5) DM II - hold metformin 6) HTN  Plan:  1) Agree with Lasix for fluid diuresis.  Will switch to PO  2) Will  trend creatinine, GFR with reassessment after palliative meeting 3) Pt is not long term dialysis candidate, will not order PTH/iron studies as treatment would not be beneficial in this patient.  As well no acute indications for HD and she is not a good candidate for this.    Appreciate palliative consult, family meeting tomorrow AM  Twana FirstBryan R. Hess, DO of [facility] 01/25/2013, 11:01 AM I have seen and examined this patient and agree with the plan of care seen, eval, examined and discussed. .  [name] 01/25/2013, 11:13 AM

## 2013-01-25 NOTE — Progress Notes (Signed)
Note: This document was prepared with digital dictation and possible smart phrase technology. Any transcriptional errors that result from this process are unintentional.   [name] NFA:[id number] DOB: Jun 21, 1945 DOA: 01/24/2013 PCP: [name], MD  Brief narrative: 68 year old ? admitted 1/23 with toxic metabolic encephalopathy in the setting of acute renal insufficiency.  Initially thought to be septic with lactic acidosis 3.6, however on metformin.  Noted polypharmacy inclusive of multiple SSRIs and other medications. Recently placed pacemaker 12/16/12 and came off of hospice because of that. complex cardiac h/o with CAD s/p STEMI 2002+RCA stentx2, , CShF class 3-4 Ef 20% 12/31/12 s/p BIV PPM for 2:1 block 12/16/12 Hospitalization complicated by Aspiration/Urinary retentuion   Past medical history-As per Problem list Chart reviewed as below- Reviewed  Consultants:  Cardiology  Critical care  Nephrology  Palliative care  Procedures:  Chest x-ray 1/23  Chest x-ray 1/24  Antibiotics:  Vancomycin 1/23--> stop  Ceftriaxone 1/23--> stop   Subjective  Much more alert oriented this morning Able to converse. Tells me clearly that she wants to be a full code Not really hungry Desats to 86% when oxygen was turned off    Objective    Interim History: None  Telemetry: Paced rhythm, no red alarms   Objective: Filed Vitals:   01/25/13 0436 01/25/13 0500 01/25/13 0600 01/25/13 0700  BP:   159/95   Pulse:  71 72 72  Temp:  97.3 F (36.3 C) 97.2 F (36.2 C) 97.2 F (36.2 C)  TempSrc:      Resp:  14 14 20   Height:      Weight: 74.4 kg (164 lb 0.4 oz)     SpO2:  98% 98% 92%    Intake/Output Summary (Last 24 hours) at 01/25/13 0735 Last data filed at 01/25/13 0000  Gross per 24 hour  Intake    150 ml  Output   1500 ml  Net  -1350 ml    Exam:  General: EOMI, NCAT Cardiovascular: S1-S2 no murmur rub or gallop Respiratory: Clinically clear  however some fine crackles potentially Abdomen: Abdomen soft nontender nondistended Skin grade 3 lower extremity pitting edema, decreased from admission Neuro grossly intact  Data Reviewed: Basic Metabolic Panel:  Recent Labs Lab 01/24/13 1249 01/24/13 1315 01/24/13 2153 01/25/13 0320  NA 132* 127* 130* 132*  K 5.9* 5.5* 5.3 4.7  CL 90* 92* 90* 92*  CO2 21  --  22 23  GLUCOSE 99 99 88 58*  BUN 41* 38* 41* 40*  CREATININE 2.00* 2.10* 1.94* 1.91*  CALCIUM 9.0  --  8.7 8.7   Liver Function Tests:  Recent Labs Lab 01/24/13 1249 01/25/13 0320  AST 36 43*  ALT 23 25  ALKPHOS 103 95  BILITOT 0.7 0.5  PROT 6.5 5.9*  ALBUMIN 2.8* 2.6*   No results found for this basename: LIPASE, AMYLASE,  in the last 168 hours  Recent Labs Lab 01/24/13 1249 01/24/13 1636  AMMONIA 30 17   CBC:  Recent Labs Lab 01/24/13 1213 01/24/13 1315 01/24/13 2153 01/25/13 0320  WBC 4.9  --  5.4 4.8  NEUTROABS 4.2  --   --   --   HGB 9.2* 10.5* 9.5* 9.0*  HCT 27.2* 31.0* 28.0* 26.8*  MCV 91.0  --  90.6 90.5  PLT 260  --  261 265   Cardiac Enzymes: No results found for this basename: CKTOTAL, CKMB, CKMBINDEX, TROPONINI,  in the last 168 hours BNP: No components found with this basename:  POCBNP,  CBG:  Recent Labs Lab 01/24/13 1206 01/24/13 2156 01/25/13 0323 01/25/13 0326 01/25/13 0426  GLUCAP 89 85 57* 64* 93    Recent Results (from the past 240 hour(s))  MRSA PCR SCREENING     Status: None   Collection Time    01/24/13  6:40 PM      Result Value Range Status   MRSA by PCR NEGATIVE  NEGATIVE Final   Comment:            The GeneXpert MRSA Assay (FDA     approved for NASAL specimens     only), is one component of a     comprehensive MRSA colonization     surveillance program. It is not     intended to diagnose MRSA     infection nor to guide or     monitor treatment for     MRSA infections.     Studies:              All Imaging reviewed and is as per above  notation   Scheduled Meds: . albuterol  2.5 mg Nebulization BID  . aspirin EC  81 mg Oral Daily  . carvedilol  6.25 mg Oral BID WC  . furosemide  80 mg Intravenous BID  . heparin  5,000 Units Subcutaneous Q8H  . insulin aspart  0-9 Units Subcutaneous TID WC  . isosorbide mononitrate  60 mg Oral Q1200  . sodium chloride  3 mL Intravenous Q12H  . tiotropium  18 mcg Inhalation Daily   Continuous Infusions:    Assessment/Plan: 1. Toxic metabolic encephalopathy secondary to polypharmacy-have discontinued multiple medications.  We'll slowly reimplement necessary SSRIs.  Discontinued empirical antibiotic coverage for pneumonia.  Currently no role for antibiotics.  Will need discussion with primary care physician regarding some of these medications as an outpatient if they are to continue 2. Nonischemic cardiomyopathy, last EF 12/31/12 = 20%-cardiology input appreciated. Continue diuresis 80 mg IV twice a day as clinically volume overloaded however intravascularly depleted. Continue Imdur 60 every 12 hourly, Coreg 6.25 twice a day, aspirin 81 daily 3. Chronic stage III-iV COPD-usually on home oxygen. Appreciate pulmonary input, continue tiotropium 18 mcg daily, albuterol 2.5 twice a day when necessary 4. Type 2 diabetes mellitus-completely discontinue oral agents. Lactic acidosis secondary to metformin. Sliding scale coverage 4 times a day a.c. at bedtime as patient now alert enough to eat. No nighttime coverage for now and the long-term long-acting insulin. 5. Cardiorenal syndrome-cardiology to comment. 6. Obstructive sleep apnea-patient supposed to be on CPAP. Will discuss with pulmonary. 7. Coronary artery disease-as above. In addition continue aspirin 81 mg daily. ACE inhibitor at this stage relatively contraindicated given creatinine 1.9. 8. Hypertension-used to be on losartan 50 daily. See above discussion. Discontinue. 9. Acute on chronic systolic/diastolic heart failure NYHA class  III-continue diuresis with IV Lasix. For now hold Aldactone 25 mg daily-theoretically this should offer some mortality benefit, however she has cardiorenal syndrome-cardiology to comment 10. Acute kidney injury-baseline creatinine 1.2. Appreciate formal nephrology input in advance.  Might benefit from IV albumin. 11. Anxiety-restart fracture or 150 every morning. Hold trazodone 150 each bedtime, Zestril 10 twice a day, gabapentin twice daily 12. 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 She reiterates as what her husband did yesterday that she wishes to BE full CODE STATUS. Palliative medicine will see her today based on my discussion with Dr. Ladona Ridgel. Appreciate input in advance.   Code Status: Full CODE STATUS Family  Communication: Will discuss with family when available Disposition Plan: Step down today, possible transition to telemetry tomorrow   Pleas KochJai Samirah Scarpati, MD  [facility] Pager [phone] 01/25/2013, 7:35 AM    LOS: 1 day

## 2013-01-25 NOTE — Progress Notes (Signed)
Foley cath discontinued earlier in shift per protocol. Patient has moderate form of dementia. Oriented to self and place when prompted. Patient placed on bedpan several times this pm. Patient stated she couldn't void/she did not have to urinate. At 1800 Patient began to complain about feeling the need to void. Placed back on bedpan with faucet running water. No results. Bladder scanned >200 noted in all 4 quads of bladder. In/out cath preformed per protocol using steril technique. yellow urine returned. Patient stated she felt better after cath.

## 2013-01-25 NOTE — Progress Notes (Signed)
Patient Seen and examined.  She is awake and alert. When asked how she feels she is doing she states "I think I am dying".  Patient affirms what I have been told by Dr. Mahala MenghiniSamtani that she wants to be a full code.  She has given me permission to talk with her husband and meet with them as soon as possible.  She said maybe tomorrow, but I will talk with her husband.   Spouse agrees to Sunday 9am meeting  1/25  Antwuan Eckley L. Ladona Ridgelaylor, MD MBA The Palliative Medicine Team at [facility] Team Phone: [phone] Pager: [phone]

## 2013-01-25 NOTE — Progress Notes (Signed)
Subjective: No CP  Does not answer all questions appropriately   Objective: Filed Vitals:   01/25/13 0436 01/25/13 0500 01/25/13 0600 01/25/13 0700  BP:   159/95   Pulse:  71 72 72  Temp:  97.3 F (36.3 C) 97.2 F (36.2 C) 97.2 F (36.2 C)  TempSrc:      Resp:  14 14 20   Height:      Weight: 164 lb 0.4 oz (74.4 kg)     SpO2:  98% 98% 92%   Weight change:   Intake/Output Summary (Last 24 hours) at 01/25/13 0745 Last data filed at 01/25/13 0000  Gross per 24 hour  Intake    150 ml  Output   1500 ml  Net  -1350 ml    General: Awake in no acute distress Neck:  JVP is increased   Heart: Regular rate and rhythm, without murmurs, rubs, gallops.  Lungs: REl clear   Exemities:  2+-3+ edema legs.   Neuro: Deferred    Lab Results: Results for orders placed during the hospital encounter of 01/24/13 (from the past 24 hour(s))  GLUCOSE, CAPILLARY     Status: None   Collection Time    01/24/13 12:06 PM      Result Value Range   Glucose-Capillary 89  70 - 99 mg/dL  CBC WITH DIFFERENTIAL     Status: Abnormal   Collection Time    01/24/13 12:13 PM      Result Value Range   WBC 4.9  4.0 - 10.5 K/uL   RBC 2.99 (*) 3.87 - 5.11 MIL/uL   Hemoglobin 9.2 (*) 12.0 - 15.0 g/dL   HCT 16.1 (*) 09.6 - 04.5 %   MCV 91.0  78.0 - 100.0 fL   MCH 30.8  26.0 - 34.0 pg   MCHC 33.8  30.0 - 36.0 g/dL   RDW 40.9 (*) 81.1 - 91.4 %   Platelets 260  150 - 400 K/uL   Neutrophils Relative % 85 (*) 43 - 77 %   Lymphocytes Relative 12  12 - 46 %   Monocytes Relative 3  3 - 12 %   Eosinophils Relative 0  0 - 5 %   Basophils Relative 0  0 - 1 %   Neutro Abs 4.2  1.7 - 7.7 K/uL   Lymphs Abs 0.6 (*) 0.7 - 4.0 K/uL   Monocytes Absolute 0.1  0.1 - 1.0 K/uL   Eosinophils Absolute 0.0  0.0 - 0.7 K/uL   Basophils Absolute 0.0  0.0 - 0.1 K/uL   RBC Morphology ACANTHOCYTES    PRO B NATRIURETIC PEPTIDE     Status: Abnormal   Collection Time    01/24/13 12:13 PM      Result Value Range   Pro B Natriuretic  peptide (BNP) 62827.0 (*) 0 - 125 pg/mL  URINALYSIS, ROUTINE W REFLEX MICROSCOPIC     Status: None   Collection Time    01/24/13 12:30 PM      Result Value Range   Color, Urine YELLOW  YELLOW   APPearance CLEAR  CLEAR   Specific Gravity, Urine 1.016  1.005 - 1.030   pH 5.5  5.0 - 8.0   Glucose, UA NEGATIVE  NEGATIVE mg/dL   Hgb urine dipstick NEGATIVE  NEGATIVE   Bilirubin Urine NEGATIVE  NEGATIVE   Ketones, ur NEGATIVE  NEGATIVE mg/dL   Protein, ur NEGATIVE  NEGATIVE mg/dL   Urobilinogen, UA 0.2  0.0 - 1.0 mg/dL   Nitrite NEGATIVE  NEGATIVE   Leukocytes, UA NEGATIVE  NEGATIVE  STREP PNEUMONIAE URINARY ANTIGEN     Status: None   Collection Time    01/24/13 12:30 PM      Result Value Range   Strep Pneumo Urinary Antigen NEGATIVE  NEGATIVE  POCT I-STAT 3, BLOOD GAS (G3+)     Status: None   Collection Time    01/24/13 12:45 PM      Result Value Range   pH, Arterial 7.391  7.350 - 7.450   pCO2 arterial 38.7  35.0 - 45.0 mmHg   pO2, Arterial 86.0  80.0 - 100.0 mmHg   Bicarbonate 23.5  20.0 - 24.0 mEq/L   TCO2 25  0 - 100 mmol/L   O2 Saturation 97.0     Acid-base deficit 1.0  0.0 - 2.0 mmol/L   Collection site RADIAL, ALLEN'S TEST ACCEPTABLE     Drawn by Operator     Sample type ARTERIAL    COMPREHENSIVE METABOLIC PANEL     Status: Abnormal   Collection Time    01/24/13 12:49 PM      Result Value Range   Sodium 132 (*) 137 - 147 mEq/L   Potassium 5.9 (*) 3.7 - 5.3 mEq/L   Chloride 90 (*) 96 - 112 mEq/L   CO2 21  19 - 32 mEq/L   Glucose, Bld 99  70 - 99 mg/dL   BUN 41 (*) 6 - 23 mg/dL   Creatinine, Ser 9.602.00 (*) 0.50 - 1.10 mg/dL   Calcium 9.0  8.4 - 45.410.5 mg/dL   Total Protein 6.5  6.0 - 8.3 g/dL   Albumin 2.8 (*) 3.5 - 5.2 g/dL   AST 36  0 - 37 U/L   ALT 23  0 - 35 U/L   Alkaline Phosphatase 103  39 - 117 U/L   Total Bilirubin 0.7  0.3 - 1.2 mg/dL   GFR calc non Af Amer 24 (*) >90 mL/min   GFR calc Af Amer 28 (*) >90 mL/min  PROTIME-INR     Status: Abnormal    Collection Time    01/24/13 12:49 PM      Result Value Range   Prothrombin Time 18.8 (*) 11.6 - 15.2 seconds   INR 1.62 (*) 0.00 - 1.49  AMMONIA     Status: None   Collection Time    01/24/13 12:49 PM      Result Value Range   Ammonia 30  11 - 60 umol/L  CG4 I-STAT (LACTIC ACID)     Status: Abnormal   Collection Time    01/24/13  1:13 PM      Result Value Range   Lactic Acid, Venous 3.47 (*) 0.5 - 2.2 mmol/L  POCT I-STAT TROPONIN I     Status: None   Collection Time    01/24/13  1:14 PM      Result Value Range   Troponin i, poc 0.03  0.00 - 0.08 ng/mL   Comment 3           POCT I-STAT, CHEM 8     Status: Abnormal   Collection Time    01/24/13  1:15 PM      Result Value Range   Sodium 127 (*) 137 - 147 mEq/L   Potassium 5.5 (*) 3.7 - 5.3 mEq/L   Chloride 92 (*) 96 - 112 mEq/L   BUN 38 (*) 6 - 23 mg/dL   Creatinine, Ser 0.982.10 (*) 0.50 - 1.10 mg/dL   Glucose, Bld  99  70 - 99 mg/dL   Calcium, Ion 4.09  8.11 - 1.30 mmol/L   TCO2 24  0 - 100 mmol/L   Hemoglobin 10.5 (*) 12.0 - 15.0 g/dL   HCT 91.4 (*) 78.2 - 95.6 %  PROCALCITONIN     Status: None   Collection Time    01/24/13  4:35 PM      Result Value Range   Procalcitonin 0.10    AMMONIA     Status: None   Collection Time    01/24/13  4:36 PM      Result Value Range   Ammonia 17  11 - 60 umol/L  MRSA PCR SCREENING     Status: None   Collection Time    01/24/13  6:40 PM      Result Value Range   MRSA by PCR NEGATIVE  NEGATIVE  BASIC METABOLIC PANEL     Status: Abnormal   Collection Time    01/24/13  9:53 PM      Result Value Range   Sodium 130 (*) 137 - 147 mEq/L   Potassium 5.3  3.7 - 5.3 mEq/L   Chloride 90 (*) 96 - 112 mEq/L   CO2 22  19 - 32 mEq/L   Glucose, Bld 88  70 - 99 mg/dL   BUN 41 (*) 6 - 23 mg/dL   Creatinine, Ser 2.13 (*) 0.50 - 1.10 mg/dL   Calcium 8.7  8.4 - 08.6 mg/dL   GFR calc non Af Amer 25 (*) >90 mL/min   GFR calc Af Amer 29 (*) >90 mL/min  CBC     Status: Abnormal   Collection Time     01/24/13  9:53 PM      Result Value Range   WBC 5.4  4.0 - 10.5 K/uL   RBC 3.09 (*) 3.87 - 5.11 MIL/uL   Hemoglobin 9.5 (*) 12.0 - 15.0 g/dL   HCT 57.8 (*) 46.9 - 62.9 %   MCV 90.6  78.0 - 100.0 fL   MCH 30.7  26.0 - 34.0 pg   MCHC 33.9  30.0 - 36.0 g/dL   RDW 52.8 (*) 41.3 - 24.4 %   Platelets 261  150 - 400 K/uL  GLUCOSE, CAPILLARY     Status: None   Collection Time    01/24/13  9:56 PM      Result Value Range   Glucose-Capillary 85  70 - 99 mg/dL  PROCALCITONIN     Status: None   Collection Time    01/25/13  3:20 AM      Result Value Range   Procalcitonin 0.11    PROTIME-INR     Status: Abnormal   Collection Time    01/25/13  3:20 AM      Result Value Range   Prothrombin Time 18.6 (*) 11.6 - 15.2 seconds   INR 1.60 (*) 0.00 - 1.49  COMPREHENSIVE METABOLIC PANEL     Status: Abnormal   Collection Time    01/25/13  3:20 AM      Result Value Range   Sodium 132 (*) 137 - 147 mEq/L   Potassium 4.7  3.7 - 5.3 mEq/L   Chloride 92 (*) 96 - 112 mEq/L   CO2 23  19 - 32 mEq/L   Glucose, Bld 58 (*) 70 - 99 mg/dL   BUN 40 (*) 6 - 23 mg/dL   Creatinine, Ser 0.10 (*) 0.50 - 1.10 mg/dL   Calcium 8.7  8.4 - 27.2 mg/dL  Total Protein 5.9 (*) 6.0 - 8.3 g/dL   Albumin 2.6 (*) 3.5 - 5.2 g/dL   AST 43 (*) 0 - 37 U/L   ALT 25  0 - 35 U/L   Alkaline Phosphatase 95  39 - 117 U/L   Total Bilirubin 0.5  0.3 - 1.2 mg/dL   GFR calc non Af Amer 26 (*) >90 mL/min   GFR calc Af Amer 30 (*) >90 mL/min  CBC     Status: Abnormal   Collection Time    01/25/13  3:20 AM      Result Value Range   WBC 4.8  4.0 - 10.5 K/uL   RBC 2.96 (*) 3.87 - 5.11 MIL/uL   Hemoglobin 9.0 (*) 12.0 - 15.0 g/dL   HCT 91.4 (*) 78.2 - 95.6 %   MCV 90.5  78.0 - 100.0 fL   MCH 30.4  26.0 - 34.0 pg   MCHC 33.6  30.0 - 36.0 g/dL   RDW 21.3 (*) 08.6 - 57.8 %   Platelets 265  150 - 400 K/uL  GLUCOSE, CAPILLARY     Status: Abnormal   Collection Time    01/25/13  3:23 AM      Result Value Range   Glucose-Capillary 57  (*) 70 - 99 mg/dL  GLUCOSE, CAPILLARY     Status: Abnormal   Collection Time    01/25/13  3:26 AM      Result Value Range   Glucose-Capillary 64 (*) 70 - 99 mg/dL  GLUCOSE, CAPILLARY     Status: None   Collection Time    01/25/13  4:26 AM      Result Value Range   Glucose-Capillary 93  70 - 99 mg/dL    Studies/Results: @RISRSLT24 @  Medications: Reviewed   @PROBHOSP @   2.  Chronic systolic CHF.  S/p BiV AICD Dec 2014  No echo post placemnt    Volume is increased  Per renal would follow respoonse to po Lasix  Follow Cr Patient on coreg  And imdur  WOuld add hydralazine to regimen for afterload reduction.   Will repeat echo to reeval LVEF    3.  COPD  DM   HTN  Patient with signif HTN  Goes against severe end stage CHF.  Would add hydralazine  Follow response on PO lasix.  Continue coreg and imdur.  Increase meds as needed.     ID  Cover for HCAP  CCM following.   Dementia  LOS: 1 day   Dietrich Pates 01/25/2013, 7:45 AM

## 2013-01-25 NOTE — Consult Note (Signed)
PULMONARY  / CRITICAL CARE MEDICINE  Name: Sumner BoastHattie B Schweitzer MRN: [id number] DOB: 01/11/1945 PCP Sanda Lingerhomas Jones, MD  ADMISSION DATE:  01/24/2013  LOS 1 days  CONSULTATION DATE:  01/24/2013  REFERRING MD :  Dr Manus Gunningancour of ER and Dr Mahala MenghiniSamtani of [facility] PRIMARY SERVICE: [facility]  CHIEF COMPLAINT:  Altered sensorium, lactic acidosis, cold clammy, mild renal insuff with baseline chronic systolic CHF  BRIEF PATIENT DESCRIPTION:   SIGNIFICANT EVENTS / STUDIES:  01/24/2013  - Admit  LINES / TUBES: None  CULTURES: all pending 1/24 Blood Urine urien leg Urine strep REsp virus multilex panel  ANTIBIOTICS:  SUBJECTIVE:  Remains full code, lasix ordered by [facility]  VITAL SIGNS:  VENTILATOR SETTINGS: Vent Mode:  [-] PRVC FiO2 (%):  [30 %] 30 % Set Rate:  [28 bmp] 28 bmp Vt Set:  [420 mL] 420 mL PEEP:  [5 cmH20] 5 cmH20 Plateau Pressure:  [4 cmH20-21 cmH20] 20 cmH20 INTAKE / OUTPUT: I/O last 3 completed shifts: In: 150 [IV Piggyback:150] Out: 1800 [Urine:1800]     PHYSICAL EXAMINATION: General:  Cachectic female. Sittig in bed, no distress Neuro:  Drowsy. RASS -1 HEENT:  Supple neck, PEERL +. No neck stiffness Cardiovascular:  Hr 60s.. Normal BP. S1S2+.  Lungs:  coarse Abdomen:  Soft, No mass, No organomegaly Musculoskeletal:  + edema in feet Skin: cachectic, edema  LABS: PULMONARY  Recent Labs Lab 01/24/13 1245 01/24/13 1315  PHART 7.391  --   PCO2ART 38.7  --   PO2ART 86.0  --   HCO3 23.5  --   TCO2 25 24  O2SAT 97.0  --     CBC  Recent Labs Lab 01/24/13 1213 01/24/13 1315 01/24/13 2153 01/25/13 0320  HGB 9.2* 10.5* 9.5* 9.0*  HCT 27.2* 31.0* 28.0* 26.8*  WBC 4.9  --  5.4 4.8  PLT 260  --  261 265    COAGULATION  Recent Labs Lab 01/24/13 1249 01/25/13 0320  INR 1.62* 1.60*    CARDIAC  No results found for this basename: TROPONINI,  in the last 168 hours  Recent Labs Lab 01/24/13 1213  PROBNP 62827.0*     CHEMISTRY  Recent  Labs Lab 01/24/13 1249 01/24/13 1315 01/24/13 2153 01/25/13 0320  NA 132* 127* 130* 132*  K 5.9* 5.5* 5.3 4.7  CL 90* 92* 90* 92*  CO2 21  --  22 23  GLUCOSE 99 99 88 58*  BUN 41* 38* 41* 40*  CREATININE 2.00* 2.10* 1.94* 1.91*  CALCIUM 9.0  --  8.7 8.7   Estimated Creatinine Clearance: 29.1 ml/min (by C-G formula based on Cr of 1.91).   LIVER  Recent Labs Lab 01/24/13 1249 01/25/13 0320  AST 36 43*  ALT 23 25  ALKPHOS 103 95  BILITOT 0.7 0.5  PROT 6.5 5.9*  ALBUMIN 2.8* 2.6*  INR 1.62* 1.60*     INFECTIOUS  Recent Labs Lab 01/24/13 1313 01/24/13 1635 01/25/13 0320  LATICACIDVEN 3.47*  --   --   PROCALCITON  --  0.10 0.11     ENDOCRINE CBG (last 3)   Recent Labs  01/25/13 0326 01/25/13 0426 01/25/13 0827  GLUCAP 64* 93 89         IMAGING x48h  Dg Chest 1 View  01/24/2013   CLINICAL DATA:  Pain.  Fall and unresponsive.  EXAM: CHEST - 1 VIEW  COMPARISON:  12/31/2012  FINDINGS: 3 lead left-sided pacemaker is unchanged in appearance. Marked enlargement of the cardiac silhouette is unchanged. There is  mild pulmonary vascular congestion, increased from prior, with mild perihilar edema. There is a small to moderate right pleural effusion, increased from prior. No acute osseous abnormality is identified.  IMPRESSION: Cardiomegaly with increased pulmonary vascular congestion and mild perihilar edema. Increased, small to moderate right pleural effusion.   Electronically Signed   By: Sebastian Ache   On: 01/24/2013 13:58   Dg Hip Complete Right  01/24/2013   CLINICAL DATA:  Fall.  Unresponsive.  EXAM: RIGHT HIP - COMPLETE 2+ VIEW  COMPARISON:  04/10/2011  FINDINGS: Mild sclerosis about the right greater than left sacroiliac joints is unchanged. There is no evidence of acute fracture. Both hips demonstrate mild joint space narrowing and marginal osteophytosis, with a prominent subchondral cyst present in the left femoral head. There is no evidence of dislocation.  Soft tissues are unremarkable.  IMPRESSION: 1. No evidence of acute fracture or dislocation. 2. Left greater than right hip osteoarthrosis. 3. Unchanged, mild sclerosis of the right greater than left SI joints.   Electronically Signed   By: Sebastian Ache   On: 01/24/2013 14:10   Ct Head Wo Contrast  01/24/2013   CLINICAL DATA:  Altered mental status.  EXAM: CT HEAD WITHOUT CONTRAST  TECHNIQUE: Contiguous axial images were obtained from the base of the skull through the vertex without intravenous contrast.  COMPARISON:  Head CT scan 11/19/2012 and brain MRI 05/20/2012.  FINDINGS: Patchy and confluent hypoattenuation in the subcortical and periventricular deep white matter is compatible with chronic microvascular ischemic change. There is no evidence of acute intracranial abnormality including infarction, hemorrhage, mass lesion, mass effect, midline shift or abnormal extra-axial fluid collection. No hydrocephalus or pneumocephalus. Tiny locules of gas in the right pterygoid loss are likely related to venous access. Atherosclerosis is noted.  IMPRESSION: No acute abnormality.  Extensive chronic microvascular ischemic change.   Electronically Signed   By: Drusilla Kanner M.D.   On: 01/24/2013 13:47   Dg Chest Port 1 View  01/25/2013   CLINICAL DATA:  Pulmonary edema.  Right pleural effusion.  EXAM: PORTABLE CHEST - 1 VIEW  COMPARISON:  01/24/2013 and 12/31/2012  FINDINGS: Pulmonary edema and pulmonary vascular congestion have increased bilaterally. Stable chronic blunting of the right costophrenic angle. Chronic cardiomegaly. AICD in place. No acute osseous abnormality. There is an old nonunion fracture of the proximal left humerus.  IMPRESSION: Increasing bilateral pulmonary edema and pulmonary vascular congestion.   Electronically Signed   By: Geanie Cooley M.D.   On: 01/25/2013 08:26    ASSESSMENT / PLAN:  PULMONARY A: Not in distress. Full code, Hx of COPD, r./o PNA vs failure P:   pcxr with patchy  infiltrates, repeat in am  Would cover hcap duoneb  CARDIOVASCULAR A: Chronic Systolic CHF , well known to [facility] P:  Per Cards pcxr does not remind me of failure, appears patchy  RENAL A: Acute on Chronic renal failure  Hyperkalemia improved  - unclear lactic acidosis, from pna? Metformin? P:   Per [facility] Kayexalate given Repeat lactic conservative with diuresis or consider holding Allow pos balance  GASTROINTESTINAL A:  Nil acute P:   Per IM  HEMATOLOGIC A:  Anemia of chronic and critical illness P:  - cbc in am  dvt prev  INFECTIOUS A:  Possible sepsis, PNA? P:   Monitor lactic acid clearance, consider holding further lasix Abx per [facility], agree nosocomial coverage  ENDOCRINE A:  DM on   metformin P:   Hold metformin, repeat lactic  SSI per [facility]  NEUROLOGIC A:  Droswy but improved P:   Allow pos balance,   TODAY'S SUMMARY: await final decisions with Pall care and husband Consider holding further lasix, consider pos balance, repeat lactic, will sign off call if needed  Mcarthur Rossetti. Tyson Alias, MD, FACP Pgr: [phone] [facility]

## 2013-01-25 NOTE — Progress Notes (Addendum)
INITIAL NUTRITION ASSESSMENT  DOCUMENTATION CODES Per approved criteria  -Severe malnutrition in the context of chronic illness   INTERVENTION:  Glucerna Shake PO TID, each supplement provides 220 kcal and 10 grams of protein  NUTRITION DIAGNOSIS: Inadequate oral intake related to poor appetite as evidenced by 25% meal completion.   Goal: Intake to meet >90% of estimated nutrition needs.  Monitor:  PO intake, labs, weight trend.  Reason for Assessment: MST  68 y.o. female  Admitting Dx: Encephalopathy  ASSESSMENT: Patient is a 68 y.o. female with an extensive cardiac history; [facility] with altered mental status. Patient has had a recent hospitalization and was sent to a nursing home and left there apparently AGAINST MEDICAL ADVICE-patient's husband allegedly took her out of the nursing home 01/12/12 after 4 days of her being there and patient made a progressive decline at home not eating not drinking not ambulatory and when was ambulatory had 2 falls on the second fall was the reason why patient was brought here.  Weight is above usual weight due to volume overload. Patient with poor intake of meals. Palliative Care consult has been ordered. Per MD H&P, patient would want PEG if needed. Previously a Hospice patient (for HF), but was rescinded in December after PPM. RN reports that patient eats well if fed. Consumed ~25% of breakfast and lunch today.  Nutrition Focused Physical Exam:  Subcutaneous Fat:  Orbital Region: mild depletion Upper Arm Region: moderate depletion Thoracic and Lumbar Region: NA  Muscle:  Temple Region: moderate depletion Clavicle Bone Region: severe depletion Clavicle and Acromion Bone Region: severe depletion Scapular Bone Region: NA Dorsal Hand: moderate depletion Patellar Region: WNL Anterior Thigh Region: WNL Posterior Calf Region: WNL  Edema: severe edema in LE  Pt meets criteria for severe MALNUTRITION in the context of  chronic illness as evidenced by severe fluid accumulation and severe loss of muscle mass.  Height: Ht Readings from Last 1 Encounters:  01/24/13 5\' 9"  (1.753 m)    Weight: Wt Readings from Last 1 Encounters:  01/25/13 164 lb 0.4 oz (74.4 kg)    Ideal Body Weight: 65.9 kg  % Ideal Body Weight: 113%  Wt Readings from Last 10 Encounters:  01/25/13 164 lb 0.4 oz (74.4 kg)  01/07/13 159 lb 13.3 oz (72.5 kg)  12/23/12 145 lb 14.4 oz (66.18 kg)  12/23/12 145 lb 14.4 oz (66.18 kg)  12/04/12 142 lb 6.4 oz (64.592 kg)  11/30/12 138 lb 14.2 oz (63 kg)  11/23/12 141 lb 12.1 oz (64.3 kg)  11/18/12 153 lb 6.4 oz (69.582 kg)  10/30/12 150 lb (68.04 kg)  09/17/12 154 lb (69.854 kg)    Usual Body Weight: 147 lb  % Usual Body Weight: 112%  BMI:  Body mass index is 24.21 kg/(m^2).  Estimated Nutritional Needs: Kcal: 1550-1750 Protein: 80-95 gm Fluid: 1.5-1.7 L  Skin: no wounds  Diet Order: Carb Control  EDUCATION NEEDS: -Education not appropriate at this time   Intake/Output Summary (Last 24 hours) at 01/25/13 0940 Last data filed at 01/25/13 0700  Gross per 24 hour  Intake    150 ml  Output   1800 ml  Net  -1650 ml    Last BM: None documented since admission   Labs:   Recent Labs Lab 01/24/13 1249 01/24/13 1315 01/24/13 2153 01/25/13 0320  NA 132* 127* 130* 132*  K 5.9* 5.5* 5.3 4.7  CL 90* 92* 90* 92*  CO2 21  --  22  23  BUN 41* 38* 41* 40*  CREATININE 2.00* 2.10* 1.94* 1.91*  CALCIUM 9.0  --  8.7 8.7  GLUCOSE 99 99 88 58*    CBG (last 3)   Recent Labs  01/25/13 0326 01/25/13 0426 01/25/13 0827  GLUCAP 64* 93 89    Scheduled Meds: . albuterol  2.5 mg Nebulization BID  . aspirin EC  81 mg Oral Daily  . carvedilol  6.25 mg Oral BID WC  . furosemide  80 mg Intravenous BID  . heparin  5,000 Units Subcutaneous Q8H  . insulin aspart  0-9 Units Subcutaneous TID WC  . isosorbide mononitrate  60 mg Oral Q1200  . sodium chloride  3 mL Intravenous  Q12H  . tiotropium  18 mcg Inhalation Daily  . venlafaxine XR  150 mg Oral q morning - 10a    Continuous Infusions:   Past Medical History  Diagnosis Date  . DYSLIPIDEMIA   . OBESITY NOS   . DEPRESSION, RECURRENT, IN PARTIAL REMISSION   . HYPERTENSION   . CORONARY ARTERY DISEASE     a. s/p Q wave MI 2002 with RCA stent x 2. b. Abnl nuc 04/2012 with subsequent cath showing patent stents in RCA, chronic diffuse disease in dLAD, 20% LM, EF 20%, no focal targets for PCI.  [name] GERD   . DIVERTICULOSIS, COLON, WITH HEMORRHAGE   . BACK PAIN, LUMBAR, WITH RADICULOPATHY   . OBSTRUCTIVE SLEEP APNEA   . Anemia, iron deficiency   . Hyponatremia   . Tobacco abuse   . Moderate COPD (chronic obstructive pulmonary disease) 11/07/2005    Spirometry 03/09/11>>FEV1 2.02 (88%), FEV1% 76    . Anxiety   . Dementia     a. Saw neuro 08/2012: concerning for early dementia, labs unremarkable except elev CRP.  [name] Chronic edema   . Weight loss     CT abd pelvis 05/2012: mild small bowel disention. No significant acute changes. Mod-severe gastritis by EGD 06/06/2012 no clear neoplastic changes. Neg small bowel follow through except for diverticulum on 06/11/12.   . Gastritis     a. by EGD 05/2012.  [name] Chronic systolic CHF (congestive heart failure)     a. Last EF 20% by cath 04/29/2012, 40-45% by echo 04/24/12.;  b.  Echocardiogram  (11/21/12): EF 10-15%, severe diffuse HK, moderate MR, moderate LAE, mild RVE, moderately reduced RVSF, moderate to severe RAE, moderate TR, PASP 56  . Ischemic cardiomyopathy   . Syncope     a. 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 BB discontinued. b. Recurrence in 11/2012.=> Life Vest placed pending ICD placmement   . 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 12/2012 SJM BiV ICD.  [name] Myocardial infarction 2002  . COPD (chronic obstructive pulmonary disease)   . Pneumonia ?2012  . Shortness of breath     "bad; most of the time" (12/31/2012)   . Type II diabetes mellitus   . Arthritis     "all over" (12/31/2012)  . Mid back pain, chronic     Past Surgical History  Procedure Laterality Date  . Uvuloplasty    . Total abdominal hysterectomy w/ bilateral salpingoophorectomy    . Cholecystectomy  2004  . Esophagogastroduodenoscopy (egd) with propofol N/A 06/06/2012    Procedure: ESOPHAGOGASTRODUODENOSCOPY (EGD) WITH PROPOFOL;  Surgeon: Rachael Fee, MD;  Location: [facility] END[facility]COPY;  Service: Endoscopy;  Laterality: N/A;  . Cardiac defibrillator placement  12/16/2012    dual chamber  DR Graciela Husbands  .  Tonsillectomy    . Appendectomy    . Total abdominal hysterectomy    . Carpal tunnel release Left   . Refractive surgery Bilateral   . Coronary angioplasty with stent placement  2002    Thrombectomy; prox RCA 3.5 x 15 mm NIR stent; mid RCA PTCA; distal RCA 3.5 x 15 mm NIR stent;       Joaquin Courts, RD, LDN, CNSC Pager [phone] After Hours Pager [phone]

## 2013-01-26 ENCOUNTER — Inpatient Hospital Stay (HOSPITAL_COMMUNITY): Payer: Medicare Other

## 2013-01-26 DIAGNOSIS — I059 Rheumatic mitral valve disease, unspecified: Secondary | ICD-10-CM

## 2013-01-26 LAB — RESPIRATORY VIRUS PANEL
ADENOVIRUS: NOT DETECTED
INFLUENZA A: NOT DETECTED
Influenza A H1: NOT DETECTED
Influenza A H3: NOT DETECTED
Influenza B: NOT DETECTED
METAPNEUMOVIRUS: NOT DETECTED
PARAINFLUENZA 2 A: NOT DETECTED
Parainfluenza 1: NOT DETECTED
Parainfluenza 3: NOT DETECTED
Respiratory Syncytial Virus A: NOT DETECTED
Respiratory Syncytial Virus B: NOT DETECTED
Rhinovirus: NOT DETECTED

## 2013-01-26 LAB — GLUCOSE, CAPILLARY
Glucose-Capillary: 113 mg/dL — ABNORMAL HIGH (ref 70–99)
Glucose-Capillary: 162 mg/dL — ABNORMAL HIGH (ref 70–99)
Glucose-Capillary: 176 mg/dL — ABNORMAL HIGH (ref 70–99)
Glucose-Capillary: 144 mg/dL — ABNORMAL HIGH (ref 70–99)

## 2013-01-26 LAB — HEPATIC FUNCTION PANEL
ALT: 32 U/L (ref 0–35)
AST: 50 U/L — ABNORMAL HIGH (ref 0–37)
Albumin: 2.5 g/dL — ABNORMAL LOW (ref 3.5–5.2)
Alkaline Phosphatase: 97 U/L (ref 39–117)
Bilirubin, Direct: <0.2 mg/dL (ref 0.0–0.3)
Total Bilirubin: 0.4 mg/dL (ref 0.3–1.2)
Total Protein: 5.9 g/dL — ABNORMAL LOW (ref 6.0–8.3)

## 2013-01-26 LAB — CBC
HCT: 25.2 % — ABNORMAL LOW (ref 36.0–46.0)
Hemoglobin: 8.6 g/dL — ABNORMAL LOW (ref 12.0–15.0)
MCH: 31 pg (ref 26.0–34.0)
MCHC: 34.1 g/dL (ref 30.0–36.0)
MCV: 91 fL (ref 78.0–100.0)
PLATELETS: 232 10*3/uL (ref 150–400)
RBC: 2.77 MIL/uL — AB (ref 3.87–5.11)
RDW: 21.9 % — ABNORMAL HIGH (ref 11.5–15.5)
WBC: 5 10*3/uL (ref 4.0–10.5)

## 2013-01-26 LAB — PROCALCITONIN

## 2013-01-26 MED ORDER — FUROSEMIDE 40 MG PO TABS
40.0000 mg | ORAL_TABLET | Freq: Two times a day (BID) | ORAL | Status: DC
  Administered 2013-01-26 – 2013-01-27 (×3): 40 mg via ORAL
  Filled 2013-01-26 (×5): qty 1

## 2013-01-26 MED ORDER — HYDRALAZINE HCL 50 MG PO TABS
50.0000 mg | ORAL_TABLET | Freq: Three times a day (TID) | ORAL | Status: DC
  Administered 2013-01-26 – 2013-01-27 (×4): 50 mg via ORAL
  Filled 2013-01-26 (×6): qty 1

## 2013-01-26 NOTE — Progress Notes (Addendum)
Note: This document was prepared with digital dictation and possible smart phrase technology. Any transcriptional errors that result from this process are unintentional.   [name] FYB:[id number] DOB: 03-03-1945 DOA: 01/24/2013 PCP: Scarlette Calico, MD  Brief narrative: 68 year old ? admitted 1/23 with toxic metabolic encephalopathy in the setting of acute renal insufficiency.  Initially thought to be septic with lactic acidosis 3.6, however on metformin.  Noted polypharmacy inclusive of multiple SSRIs and other medications. Recently placed pacemaker 12/16/12 and came off of hospice because of that. complex cardiac h/o with CAD s/p STEMI 2002+RCA stentx2, CShF class 3-4 Ef 20% 12/31/12 s/p BIV PPM for 2:1 block 12/16/12 Hospitalization complicated by Aspiration/Urinary retention. Mentation gradually improved during hospital stay but she has some underlying mild to moderate dementia and still cannot orient to place or person Palliative care consult requested 1/24 for goals of care given multiorgan illness   Past medical history-As per Problem list Chart reviewed as below- Reviewed  Consultants:  Cardiology  Critical care  Nephrology  Palliative care  Procedures:  Chest x-ray 1/23  Chest x-ray 1/24  Antibiotics:  Vancomycin 1/23--> stop  Ceftriaxone 1/23--> stop   Subjective  Much more alert. Cannot however oriented to place or person Can tell me that her husband is [place] Nursing reports tolerated about 50% of her meals yesterday but had to be coaxed to eat her meals No further double vision Needed straight Foley cath Desats to 80% on room air    Objective    Interim History: None  Telemetry: Paced rhythm, no red alarms   Objective: Filed Vitals:   01/26/13 0500 01/26/13 0600 01/26/13 0616 01/26/13 0700  BP:   152/104   Pulse:  80  79  Temp:      TempSrc:      Resp: _0 Height:      Weight:      SpO2:  99%  100%    Intake/Output  Summary (Last 24 hours) at 01/26/13 0716 Last data filed at 01/26/13 0400  Gross per 24 hour  Intake    680 ml  Output   1090 ml  Net   -410 ml    Exam:  General: EOMI, NCAT Cardiovascular: S1-S2 no murmur rub or gallop Respiratory: Decreased breath sounds bilaterally Abdomen: Abdomen soft nontender nondistended Skin grade 3 lower extremity pitting edema, decreased from admission Neuro grossly intact  Data Reviewed: Basic Metabolic Panel:  Recent Labs Lab 01/24/13 1249 01/24/13 1315 01/24/13 2153 01/25/13 0320  NA 132* 127* 130* 132*  K 5.9* 5.5* 5.3 4.7  CL 90* 92* 90* 92*  CO2 21  --  22 23  GLUCOSE 99 99 88 58*  BUN 41* 38* 41* 40*  CREATININE 2.00* 2.10* 1.94* 1.91*  CALCIUM 9.0  --  8.7 8.7   Liver Function Tests:  Recent Labs Lab 01/24/13 1249 01/25/13 0320 01/26/13 0351  AST 36 43* 50*  ALT 23 25 32  ALKPHOS 103 95 97  BILITOT 0.7 0.5 0.4  PROT 6.5 5.9* 5.9*  ALBUMIN 2.8* 2.6* 2.5*   No results found for this basename: LIPASE, AMYLASE,  in the last 168 hours  Recent Labs Lab 01/24/13 1249 01/24/13 1636  AMMONIA 30 17   CBC:  Recent Labs Lab 01/24/13 1213 01/24/13 1315 01/24/13 2153 01/25/13 0320 01/26/13 0351  WBC 4.9  --  5.4 4.8 5.0  NEUTROABS 4.2  --   --   --   --   HGB 9.2* 10.5*  9.5* 9.0* 8.6*  HCT 27.2* 31.0* 28.0* 26.8* 25.2*  MCV 91.0  --  90.6 90.5 91.0  PLT 260  --  261 265 232   Cardiac Enzymes: No results found for this basename: CKTOTAL, CKMB, CKMBINDEX, TROPONINI,  in the last 168 hours BNP: No components found with this basename: POCBNP,  CBG:  Recent Labs Lab 01/25/13 0426 01/25/13 0827 01/25/13 1208 01/25/13 1658 01/25/13 2125  GLUCAP 93 89 107* 114* 131*    Recent Results (from the past 240 hour(s))  URINE CULTURE     Status: None   Collection Time    01/24/13 12:30 PM      Result Value Range Status   Specimen Description URINE, RANDOM   Final   Special Requests Immunocompromised   Final    Culture  Setup Time     Final   Value: 01/24/2013 16:58     Performed at [place] [facility]     Final   Value: NO GROWTH     Performed at [facility]   Culture     Final   Value: NO GROWTH     Performed at [facility]   Report Status 01/25/2013 FINAL   Final  CULTURE, BLOOD (ROUTINE X 2)     Status: None   Collection Time    01/24/13  4:40 PM      Result Value Range Status   Specimen Description BLOOD HAND LEFT   Final   Special Requests BOTTLES DRAWN AEROBIC ONLY [facility]   Final   Culture  Setup Time     Final   Value: 01/24/2013 20:24     Performed at [facility]   Culture     Final   Value:        BLOOD CULTURE RECEIVED NO GROWTH TO DATE CULTURE WILL BE HELD FOR 5 DAYS BEFORE ISSUING A FINAL NEGATIVE REPORT     Performed at [facility]   Report Status PENDING   Incomplete  CULTURE, BLOOD (ROUTINE X 2)     Status: None   Collection Time    01/24/13  4:45 PM      Result Value Range Status   Specimen Description BLOOD ARM LEFT   Final   Special Requests BOTTLES DRAWN AEROBIC ONLY [facility]   Final   Culture  Setup Time     Final   Value: 01/24/2013 20:24     Performed at [facility]   Culture     Final   Value:        BLOOD CULTURE RECEIVED NO GROWTH TO DATE CULTURE WILL BE HELD FOR 5 DAYS BEFORE ISSUING A FINAL NEGATIVE REPORT     Performed at [facility]   Report Status PENDING   Incomplete  RESPIRATORY VIRUS PANEL     Status: None   Collection Time    01/24/13  6:00 PM      Result Value Range Status   Source - RVPAN NASAL SWAB   Corrected   Comment: CORRECTED ON 01/25 AT 1093: PREVIOUSLY REPORTED AS NASAL SWAB   Respiratory Syncytial Virus A NOT DETECTED   Final   Respiratory Syncytial Virus B NOT DETECTED   Final   Influenza A NOT DETECTED   Final   Influenza B NOT DETECTED   Final   Parainfluenza 1 NOT DETECTED   Final   Parainfluenza 2 NOT DETECTED   Final   Parainfluenza 3 NOT DETECTED  Final    Metapneumovirus NOT DETECTED   Final   Rhinovirus NOT DETECTED   Final   Adenovirus NOT DETECTED   Final   Influenza A H1 NOT DETECTED   Final   Influenza A H3 NOT DETECTED   Final   Comment: (NOTE)           Normal Reference Range for each Analyte: NOT DETECTED     Testing performed using the Luminex xTAG Respiratory Viral Panel test     kit.     This test was developed and its performance characteristics determined     by [facility]. It has not been cleared or approved by the [place]     Food and Drug Administration. This test is used for clinical purposes.     It should not be regarded as investigational or for research. This     laboratory is certified under the [place] (CLIA) as qualified to perform high complexity     clinical laboratory testing.     Performed at [facility] PCR SCREENING     Status: None   Collection Time    01/24/13  6:40 PM      Result Value Range Status   MRSA by PCR NEGATIVE  NEGATIVE Final   Comment:            The GeneXpert MRSA Assay (FDA     approved for NASAL specimens     only), is one component of a     comprehensive MRSA colonization     surveillance program. It is not     intended to diagnose MRSA     infection nor to guide or     monitor treatment for     MRSA infections.     Studies:              All Imaging reviewed and is as per above notation   Scheduled Meds: . albuterol  2.5 mg Nebulization BID  . aspirin EC  81 mg Oral Daily  . carvedilol  6.25 mg Oral BID WC  . feeding supplement (GLUCERNA SHAKE)  237 mL Oral TID BM  . heparin  5,000 Units Subcutaneous Q8H  . hydrALAZINE  25 mg Oral Q8H  . insulin aspart  0-9 Units Subcutaneous TID WC  . isosorbide mononitrate  60 mg Oral Q1200  . sodium chloride  3 mL Intravenous Q12H  . tiotropium  18 mcg Inhalation Daily  . venlafaxine XR  150 mg Oral q morning - 10a   Continuous Infusions:     Assessment/Plan: 1. Toxic metabolic encephalopathy secondary to polypharmacy-have discontinued multiple medications.  We'll slowly reimplement necessary SSRIs.  Respiratory Viral panel/Blood cult neg so far.  Currently no role for antibiotics.   2. Acute hypoxic resp failure-potentially related to volume overload. See below discussion. 3. Acute on chronic systolic/diastolic heart failure NYHA class III.+Nonischemic cardiomyopathy, last EF 12/31/12 = 20%-cardiology input appreciated. Get CXR.  Continue Imdur 60 every 12 hourly, Coreg 6.25 twice a day, aspirin 81 daily--patient may need diuresis to prevent #2-Lasix was held 1/24 given intravascular volume depletion.  Echocardiogram is still pending 4. Chronic stage III-iV COPD-usually on home oxygen. Appreciate pulmonary input, continue tiotropium 18 mcg daily, albuterol 2.5 twice a day when necessary 5. Type 2 diabetes mellitus-completely discontinue oral agents. Lactic acidosis secondary to metformin. Sliding scale coverage 4 times a day a.c. at bedtime as patient now alert  enough to eat. No nighttime coverage for now and the long-term long-acting insulin. 6. Cardiorenal syndrome-see below discussion 13 7. Obstructive sleep apnea-patient supposed to be on CPAP? 8. Coronary artery disease-as above. In addition continue aspirin 81 mg daily. ACE inhibitor at this stage relatively contraindicated given creatinine 1.9. 9. Hypertension-used to be on losartan 50 daily. See above discussion. Increase hydralazine to 50 mg every 8 hourly 10. Acute kidney injury-baseline creatinine 1.2. Appreciate formal nephrology input in advance.  Might benefit from IV albumin? 11. Anxiety-restart Effexor 150 every morning. Hold other potenti oric agents for now.ally psychotropic 12. Elevated INR-unclear etiology. Could be related to venous stasis from congestive heart failure, could also be related to non-alcoholic steatohepatitis. AST/ALT indicate this. Hold on  ultrasound abdomen unless INR above 1.8 13. 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 She reiterates as what her husband did yesterday that she wishes to BE full CODE STATUS. Palliative medicine will see her today based on my discussion with Dr. Lovena Le. Appreciate input in advance.   Code Status: Full CODE STATUS Family Communication: Palliative meeting scheduled for today  Disposition Plan: transfer to telemetry this morning    Verneita Griffes, MD  [facility] Pager [phone] 01/26/2013, 7:16 AM    LOS: 2 days

## 2013-01-26 NOTE — Progress Notes (Addendum)
Subjective: No CP, complains of SOB.     Meds;   . albuterol  2.5 mg Nebulization BID  . aspirin EC  81 mg Oral Daily  . carvedilol  6.25 mg Oral BID WC  . feeding supplement (GLUCERNA SHAKE)  237 mL Oral TID BM  . furosemide  40 mg Oral BID  . heparin  5,000 Units Subcutaneous Q8H  . hydrALAZINE  50 mg Oral Q8H  . insulin aspart  0-9 Units Subcutaneous TID WC  . isosorbide mononitrate  60 mg Oral Q1200  . sodium chloride  3 mL Intravenous Q12H  . tiotropium  18 mcg Inhalation Daily  . venlafaxine XR  150 mg Oral q morning - 10a    Objective: Filed Vitals:   01/26/13 0800 01/26/13 1000 01/26/13 1149 01/26/13 1200  BP: 144/91 158/92 160/90 166/99  Pulse: 86  83 81  Temp: 97.5 F (36.4 C)  98.2 F (36.8 C)   TempSrc: Oral  Oral   Resp: 20 13 10 25   Height:      Weight:      SpO2: 97%  93% 100%   Weight change: 1 lb 15.7 oz (0.9 kg)  Intake/Output Summary (Last 24 hours) at 01/26/13 1316 Last data filed at 01/26/13 1200  Gross per 24 hour  Intake    440 ml  Output   1090 ml  Net   -650 ml    General: Awake in no acute distress Neck:  JVP is increased   Heart: Regular rate and rhythm, without murmurs, rubs, gallops.  Lungs: REl clear   Exemities:  2+-3+ edema legs.   Neuro: Deferred    Lab Results: Results for orders placed during the hospital encounter of 01/24/13 (from the past 24 hour(s))  GLUCOSE, CAPILLARY     Status: Abnormal   Collection Time    01/25/13  4:58 PM      Result Value Range   Glucose-Capillary 114 (*) 70 - 99 mg/dL  GLUCOSE, CAPILLARY     Status: Abnormal   Collection Time    01/25/13  9:25 PM      Result Value Range   Glucose-Capillary 131 (*) 70 - 99 mg/dL  CBC     Status: Abnormal   Collection Time    01/26/13  3:51 AM      Result Value Range   WBC 5.0  4.0 - 10.5 K/uL   RBC 2.77 (*) 3.87 - 5.11 MIL/uL   Hemoglobin 8.6 (*) 12.0 - 15.0 g/dL   HCT 81.1 (*) 91.4 - 78.2 %   MCV 91.0  78.0 - 100.0 fL   MCH 31.0  26.0 - 34.0 pg    MCHC 34.1  30.0 - 36.0 g/dL   RDW 95.6 (*) 21.3 - 08.6 %   Platelets 232  150 - 400 K/uL  HEPATIC FUNCTION PANEL     Status: Abnormal   Collection Time    01/26/13  3:51 AM      Result Value Range   Total Protein 5.9 (*) 6.0 - 8.3 g/dL   Albumin 2.5 (*) 3.5 - 5.2 g/dL   AST 50 (*) 0 - 37 U/L   ALT 32  0 - 35 U/L   Alkaline Phosphatase 97  39 - 117 U/L   Total Bilirubin 0.4  0.3 - 1.2 mg/dL   Bilirubin, Direct <5.7  0.0 - 0.3 mg/dL   Indirect Bilirubin NOT CALCULATED  0.3 - 0.9 mg/dL  PROCALCITONIN     Status: None  Collection Time    01/26/13  3:51 AM      Result Value Range   Procalcitonin <0.10    GLUCOSE, CAPILLARY     Status: Abnormal   Collection Time    01/26/13  8:21 AM      Result Value Range   Glucose-Capillary 113 (*) 70 - 99 mg/dL  GLUCOSE, CAPILLARY     Status: Abnormal   Collection Time    01/26/13 11:52 AM      Result Value Range   Glucose-Capillary 162 (*) 70 - 99 mg/dL   Echo: 4/09/81191/25/2015 Study Conclusions  - Study data: Technically adequate study. - Left ventricle: The cavity size was normal. Wall thickness was normal. Systolic function was severely reduced. LVEF <10%. Severe diffuse hypokinesis. Doppler parameters are consistent with restrictive physiology, indicative of decreased left ventricular diastolic compliance and/or increased left atrial pressure. - Aortic valve: Mildly to moderately calcified annulus. Moderately thickened leaflets. Valve area: 1.43cm^2(VTI). Valve area: 1.49cm^2 (Vmax). - Mitral valve: Moderate regurgitation. The MR vena contracta is 0.4 cm. The MR appears to be functional with a central jet due to poor leaflet coaptation in the setting of severeLV systolic dysfunction. - Left atrium: The atrium was severely dilated. - Right ventricle: D-shaped interventricular septum during diastole, consistent with RV volume overload. The cavity size was moderately dilated. Systolic function was severely reduced. RV TAPSE is 0.8  cm. - Right atrium: The atrium was severely dilated. - Tricuspid valve: Moderate regurgitation. - Pulmonic valve: Moderate regurgitation. - Pulmonary arteries: Systolic pressure was moderately increased. PA peak pressure: 60mm Hg (S). - Inferior vena cava: The vessel was dilated; the respirophasic diameter changes were blunted (< 50%); findings are consistent with elevated central venous pressure.    Assessment and Plan:   1.  Acute on chronic systolic CHF.  S/p BiV AICD Dec 2014, today's echocardiogram shows severely impaired LVEF 10% with restrictive diastolic pattern and elevated filling pressures. -650 cc in the last 24 hours, however still fluid overloaded. Continue Lasix 40 mg po BID. Crea is stable.  Imdur/hydralazine were added for afterload reduction, the BP is still elevated we will increase Coreg to 12.5 mg po BID.   The overall prognosis is poor, the patient is DNR and palliative care is being considered. The family wishes to treat symptoms.    2. HTN - uncontrolled, increase coreg, continue hydralazine/imdur  3. COPD  4. Dementia  The patient will be discharged tomorrow into palliative care. Dr Mahala MenghiniSamtani would appreciate our final recommendations for outpatient meds. Also ICD will need to be deactivated before discharge.    LOS: 2 days   Tobias AlexanderELSON, Jeda Pardue, H 01/26/2013, 1:16 PM

## 2013-01-26 NOTE — Progress Notes (Signed)
Subjective: Interval History: has need for recurring intermittent caths, does not void on her own..  Objective: Vital signs in last 24 hours: Temp:  [97.4 F (36.3 C)-98.9 F (37.2 C)] 97.5 F (36.4 C) (01/25 0800) Pulse Rate:  [69-86] 86 (01/25 0800) Resp:  [11-22] 20 (01/25 0800) BP: (144-176)/(75-104) 144/91 mmHg (01/25 0800) SpO2:  [94 %-100 %] 97 % (01/25 0800) Weight:  [73.4 kg (161 lb 13.1 oz)] 73.4 kg (161 lb 13.1 oz) (01/25 0422) Weight change: 0.9 kg (1 lb 15.7 oz)  Intake/Output from previous day: 01/24 0701 - 01/25 0700 In: 680 [P.O.:680] Out: 1090 [Urine:1090] Intake/Output this shift:    General appearance: slowed mentation and confused, perseverates Neck: L [place] pacer Resp: diminished breath sounds bilaterally and rales bibasilar Cardio: S1, S2 normal and systolic murmur: holosystolic 2/6, blowing at apex GI: pos bs, liver down, 5 cm. Extremities: edema 1+  Lab Results:  Recent Labs  01/25/13 0320 01/26/13 0351  WBC 4.8 5.0  HGB 9.0* 8.6*  HCT 26.8* 25.2*  PLT 265 232   BMET:  Recent Labs  01/24/13 2153 01/25/13 0320  NA 130* 132*  K 5.3 4.7  CL 90* 92*  CO2 22 23  GLUCOSE 88 58*  BUN 41* 40*  CREATININE 1.94* 1.91*  CALCIUM 8.7 8.7   No results found for this basename: PTH,  in the last 72 hours Iron Studies: No results found for this basename: IRON, TIBC, TRANSFERRIN, FERRITIN,  in the last 72 hours  Studies/Results: Dg Chest 1 View  01/24/2013   CLINICAL DATA:  Pain.  Fall and unresponsive.  EXAM: CHEST - 1 VIEW  COMPARISON:  12/31/2012  FINDINGS: 3 lead left-sided pacemaker is unchanged in appearance. Marked enlargement of the cardiac silhouette is unchanged. There is mild pulmonary vascular congestion, increased from prior, with mild perihilar edema. There is a small to moderate right pleural effusion, increased from prior. No acute osseous abnormality is identified.  IMPRESSION: Cardiomegaly with increased pulmonary vascular congestion  and mild perihilar edema. Increased, small to moderate right pleural effusion.   Electronically Signed   By: Sebastian Ache   On: 01/24/2013 13:58   Dg Hip Complete Right  01/24/2013   CLINICAL DATA:  Fall.  Unresponsive.  EXAM: RIGHT HIP - COMPLETE 2+ VIEW  COMPARISON:  04/10/2011  FINDINGS: Mild sclerosis about the right greater than left sacroiliac joints is unchanged. There is no evidence of acute fracture. Both hips demonstrate mild joint space narrowing and marginal osteophytosis, with a prominent subchondral cyst present in the left femoral head. There is no evidence of dislocation. Soft tissues are unremarkable.  IMPRESSION: 1. No evidence of acute fracture or dislocation. 2. Left greater than right hip osteoarthrosis. 3. Unchanged, mild sclerosis of the right greater than left SI joints.   Electronically Signed   By: Sebastian Ache   On: 01/24/2013 14:10   Ct Head Wo Contrast  01/24/2013   CLINICAL DATA:  Altered mental status.  EXAM: CT HEAD WITHOUT CONTRAST  TECHNIQUE: Contiguous axial images were obtained from the base of the skull through the vertex without intravenous contrast.  COMPARISON:  Head CT scan 11/19/2012 and brain MRI 05/20/2012.  FINDINGS: Patchy and confluent hypoattenuation in the subcortical and periventricular deep white matter is compatible with chronic microvascular ischemic change. There is no evidence of acute intracranial abnormality including infarction, hemorrhage, mass lesion, mass effect, midline shift or abnormal extra-axial fluid collection. No hydrocephalus or pneumocephalus. Tiny locules of gas in the right pterygoid loss  are likely related to venous access. Atherosclerosis is noted.  IMPRESSION: No acute abnormality.  Extensive chronic microvascular ischemic change.   Electronically Signed   By: Drusilla Kannerhomas  Dalessio M.D.   On: 01/24/2013 13:47   Dg Chest Port 1 View  01/25/2013   CLINICAL DATA:  Pulmonary edema.  Right pleural effusion.  EXAM: PORTABLE CHEST - 1 VIEW   COMPARISON:  01/24/2013 and 12/31/2012  FINDINGS: Pulmonary edema and pulmonary vascular congestion have increased bilaterally. Stable chronic blunting of the right costophrenic angle. Chronic cardiomegaly. AICD in place. No acute osseous abnormality. There is an old nonunion fracture of the proximal left humerus.  IMPRESSION: Increasing bilateral pulmonary edema and pulmonary vascular congestion.   Electronically Signed   By: Geanie CooleyJim  Maxwell M.D.   On: 01/25/2013 08:26    I have reviewed the patient's current medications.  Assessment/Plan: 1  CKD not new.  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  For now Foley. Rec get Urology eval and make decision.  Urine is clean 2 Anemia 3 CM with low EF 4 Dementia P will place foley, Rec urology eval.  Will use bid lasix,, will S/O at this time    LOS: 2 days   Abriella Filkins L 01/26/2013,8:33 AM

## 2013-01-26 NOTE — Progress Notes (Signed)
Echo Lab  2D Echocardiogram completed.  Natashia Roseman L Cassity Christian, RDCS 01/26/2013 11:30 AM

## 2013-01-26 NOTE — Consult Note (Addendum)
Patient VW:[id number] [name]      DOB: 03/12/1945      JYN:[id number]     Consult Note from the Palliative Medicine Team at [facility] Requested by:  Dr. 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  Patient was admitted with altered mental status likely multifactorial ( polypharmacy, resp failure secondary to volume overload and cardiomyopathy).  Met with patient and her spouse.  [name] is very dedicated but recognizes that his wife needs care that he is not able to provide at home.  He understands that she has multisystem organ failure and was able to relate that he is not quite prepared to stop curative treatments at this time.  H e really appreciated hospice care but knows that he still wants the doctors to do some treatments.  He did say that he would not want her to go through CPR of life support and and has completed a MOST form with me which is on the chart.  At this time having her to be able to eat is important to him and so he relates that he would want a trial period of feeding tube if she had meaningful interactions with him.  We talked about the pros and cons and I have given him a Hard Choices for [employer] book to review.  He is open to Palliative care services following in the SNF.  He wants his wife to return to SNF before coming home.    Goals of Care: 1.  Code Status: DNR per spouses choice. Patient does not have capacity for decision making   2. Scope of Treatment: Spouse is expecting to treat the treatable at this time.  He desires to at least stabilize her condition and place her or rehab.  He is open to Palliative Care Services following in the community.  4. Disposition: To  SNF when medically stable.   3. Symptom Management:   1. Anxiety/Agitation: Not noted. 2. Depression and Dementia: agree with continue effexor 3. Pain:Tramadol at home , not complaining of pain here. 4. Bowel Regimen: Monitor 5. Inability to urinate: agree with foley placement for comfort and dignity until urology sees her. 6. Failure to thrive with malnutrition: patient will need to be hand fed and supplements added.  4. Psychosocial:  Patient worked for [employer]  5. Spiritual: Spiritual care offered.        Patient Documents Completed or Given: Document Given Completed  Advanced Directives Pkt    MOST  x  DNR  x  Gone from [facility]    Hard Choices x     Brief HPI: 68 yr old with advanced dementia admitted from home.  Patient had been on Hospice with [place] prior to admission but was released to pursue PM when she develop 2:1 with syncope.  Pt returns to hospital with altered mental status.  I was asked to assist with GOC   ROS: limited by patient's poor cognitive status    PMH:  Past Medical History  Diagnosis Date  . DYSLIPIDEMIA   . OBESITY NOS   . DEPRESSION, RECURRENT, IN PARTIAL REMISSION   . HYPERTENSION   . CORONARY ARTERY DISEASE     a.  s/p Q wave MI 2002 with RCA stent x 2. [name]. Abnl nuc 04/2012 with subsequent cath showing patent stents in RCA, chronic diffuse disease in dLAD, 20% LM, EF 20%, no focal targets for PCI.  [name] GERD   . DIVERTICULOSIS, COLON, WITH HEMORRHAGE   . BACK PAIN, LUMBAR, WITH RADICULOPATHY   . OBSTRUCTIVE SLEEP APNEA   . Anemia, iron deficiency   . Hyponatremia   . Tobacco abuse   . Moderate COPD (chronic obstructive pulmonary disease) 11/07/2005    Spirometry 03/09/11>>FEV1 2.02 (88%), FEV1% 76    . Anxiety   . Dementia     a. Saw neuro 08/2012: concerning for early dementia, labs unremarkable except elev CRP.  [name] Chronic edema   . Weight loss     CT abd pelvis 05/2012: mild small bowel disention. No  significant acute changes. Mod-severe gastritis by EGD 06/06/2012 no clear neoplastic changes. Neg small bowel follow through except for diverticulum on 06/11/12.   . Gastritis     a. by EGD 05/2012.  [name] Chronic systolic CHF (congestive heart failure)     a. Last EF 20% by cath 04/29/2012, 40-45% by echo 04/24/12.;  [name].  Echocardiogram  (11/21/12): EF 10-15%, severe diffuse HK, moderate MR, moderate LAE, mild RVE, moderately reduced RVSF, moderate to severe RAE, moderate TR, PASP 56  . Ischemic cardiomyopathy   . Syncope     a. 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 BB discontinued. [name]. Recurrence in 11/2012.=> Life Vest placed pending ICD placmement   . 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 12/2012 SJM BiV ICD.  [name] Myocardial infarction 2002  . COPD (chronic obstructive pulmonary disease)   . Pneumonia ?2012  . Shortness of breath     "bad; most of the time" (12/31/2012)  . Type II diabetes mellitus   . Arthritis     "all over" (12/31/2012)  . Mid back pain, chronic      PSH: Past Surgical History  Procedure Laterality Date  . Uvuloplasty    . Total abdominal hysterectomy w/ bilateral salpingoophorectomy    . Cholecystectomy  2004  . Esophagogastroduodenoscopy (egd) with propofol N/A 06/06/2012    Procedure: ESOPHAGOGASTRODUODENOSCOPY (EGD) WITH PROPOFOL;  Surgeon: Milus Banister, MD;  Location: [facility];  Service: Endoscopy;  Laterality: N/A;  . Cardiac defibrillator placement  12/16/2012    dual chamber  DR Caryl Comes  . Tonsillectomy    . Appendectomy    . Total abdominal hysterectomy    . Carpal tunnel release Left   . Refractive surgery Bilateral   . Coronary angioplasty with stent placement  2002    Thrombectomy; prox RCA 3.5 x 15 mm NIR stent; mid RCA PTCA; distal RCA 3.5 x 15 mm NIR stent;      I have reviewed the FH and SH and  If appropriate update it with new information. Allergies  Allergen Reactions  . Amlodipine Other  (See Comments)    constipation  . Ramipril Cough  . Adhesive [Tape] Hives    "peels skin off"  . Morphine And Related Other (See Comments)    Per Husband, Pt has hallucinations  . Penicillins Itching  . Codeine Rash   Scheduled Meds: . albuterol  2.5 mg Nebulization BID  . aspirin EC  81 mg Oral Daily  . carvedilol  6.25 mg Oral BID WC  . feeding supplement (GLUCERNA SHAKE)  237 mL Oral TID BM  . furosemide  40 mg Oral BID  .  heparin  5,000 Units Subcutaneous Q8H  . hydrALAZINE  50 mg Oral Q8H  . insulin aspart  0-9 Units Subcutaneous TID WC  . isosorbide mononitrate  60 mg Oral Q1200  . sodium chloride  3 mL Intravenous Q12H  . tiotropium  18 mcg Inhalation Daily  . venlafaxine XR  150 mg Oral q morning - 10a   Continuous Infusions:  PRN Meds:.albuterol, polyethylene glycol    BP 144/91  Pulse 86  Temp(Src) 97.5 F (36.4 C) (Oral)  Resp 20  Ht _0  (1.753 m)  Wt 73.4 kg (161 lb 13.1 oz)  BMI 23.89 kg/m2  SpO2 97%   PPS: 30 %   Intake/Output Summary (Last 24 hours) at 01/26/13 2426 Last data filed at 01/26/13 0400  Gross per 24 hour  Intake    440 ml  Output   1090 ml  Net   -650 ml   LBM: 1/24                    Physical Exam:  General: Pleasantly demented , keeps eyes closed but will talk , usually tangential HEENT:  PERRL, EOMI, anciteric, mmm Chest:   Decreased, some crackles at bases, healing PM left chest CVS: paced , S1, S2 Abdomen:soft, not tender or distended Ext: warm, edema 1 + Neuro:oriented to herself, and spouse but does not remember general conversations and is somewhat tangential in conversation.  Labs: CBC    Component Value Date/Time   WBC 5.0 01/26/2013 0351   WBC 4.5 09/17/2012 0753   RBC 2.77* 01/26/2013 0351   RBC 3.11* 09/17/2012 0753   RBC 3.32* 02/22/2007 1826   HGB 8.6* 01/26/2013 0351   HGB 9.6* 09/17/2012 0753   HCT 25.2* 01/26/2013 0351   HCT 29.1* 09/17/2012 0753   PLT 232 01/26/2013 0351   PLT 307 09/17/2012 0753   MCV  91.0 01/26/2013 0351   MCV 93.6 09/17/2012 0753   MCH 31.0 01/26/2013 0351   MCH 30.9 09/17/2012 0753   MCHC 34.1 01/26/2013 0351   MCHC 33.0 09/17/2012 0753   RDW 21.9* 01/26/2013 0351   RDW 14.1 09/17/2012 0753   LYMPHSABS 0.6* 01/24/2013 1213   LYMPHSABS 1.0 09/17/2012 0753   MONOABS 0.1 01/24/2013 1213   MONOABS 0.3 09/17/2012 0753   EOSABS 0.0 01/24/2013 1213   EOSABS 0.1 09/17/2012 0753   BASOSABS 0.0 01/24/2013 1213   BASOSABS 0.0 09/17/2012 0753      CMP     Component Value Date/Time   NA 132* 01/25/2013 0320   NA 138 04/16/2012 0936   K 4.7 01/25/2013 0320   K 4.0 04/16/2012 0936   CL 92* 01/25/2013 0320   CL 98 04/16/2012 0936   CO2 23 01/25/2013 0320   CO2 30* 04/16/2012 0936   GLUCOSE 58* 01/25/2013 0320   GLUCOSE 100* 04/16/2012 0936   BUN 40* 01/25/2013 0320   BUN 9.8 04/16/2012 0936   CREATININE 1.91* 01/25/2013 0320   CREATININE 1.33* 12/04/2012 1554   CREATININE 1.1 04/16/2012 0936   CALCIUM 8.7 01/25/2013 0320   CALCIUM 8.9 04/16/2012 0936   PROT 5.9* 01/26/2013 0351   PROT 6.2 08/16/2012 0910   PROT 6.5 04/16/2012 0936   ALBUMIN 2.5* 01/26/2013 0351   ALBUMIN 2.8* 04/16/2012 0936   AST 50* 01/26/2013 0351   AST 13 04/16/2012 0936   ALT 32 01/26/2013 0351   ALT 7 04/16/2012 0936   ALKPHOS 97 01/26/2013 0351   ALKPHOS 88 04/16/2012 0936   BILITOT  0.4 01/26/2013 0351   BILITOT 0.48 04/16/2012 0936   GFRNONAA 26* 01/25/2013 0320   GFRAA 30* 01/25/2013 0320    Chest Xray Reviewed/Impressions:Increasing bilateral pulmonary edema and pulmonary vascular  congestion.    CT scan of the Head Reviewed/Impressions:No acute abnormality.  Extensive chronic microvascular ischemic change.     Time In Time Out Total Time Spent with Patient Total Overall Time  855 am 930 am  30 min 35 min    Greater than 50%  of this time was spent counseling and coordinating care related to the above assessment and plan.    Mohannad Olivero L. Lovena Le, MD MBA The Palliative Medicine Team at [facility]  Phone: [phone] Pager: [phone]

## 2013-01-26 NOTE — Progress Notes (Signed)
Foley cath placed at 1010 per nephrology order AND acute urinary retention. Unsure if patients dementia affecting patients ability to sense the need to void. Patient required multiple I/O caths since foley discontinued yesterday. Nephrology consulting urology. Prior to foley insertion, peri care completed. Foley placed by Yvone NeuPortia Caedyn Tassinari, RN using sterile technique and Doy Minceeanna Revis, RN assisting. Immediate urine return noted. Patient tolerated procedure well.

## 2013-01-27 LAB — RENAL FUNCTION PANEL
ALBUMIN: 2.7 g/dL — AB (ref 3.5–5.2)
BUN: 28 mg/dL — ABNORMAL HIGH (ref 6–23)
CHLORIDE: 93 meq/L — AB (ref 96–112)
CO2: 25 mEq/L (ref 19–32)
Calcium: 9 mg/dL (ref 8.4–10.5)
Creatinine, Ser: 1.31 mg/dL — ABNORMAL HIGH (ref 0.50–1.10)
GFR calc Af Amer: 47 mL/min — ABNORMAL LOW (ref >90)
GFR, EST NON AFRICAN AMERICAN: 41 mL/min — AB (ref >90)
Glucose, Bld: 136 mg/dL — ABNORMAL HIGH (ref 70–99)
Phosphorus: 2.8 mg/dL (ref 2.3–4.6)
Potassium: 3.8 mEq/L (ref 3.7–5.3)
SODIUM: 137 meq/L (ref 137–147)

## 2013-01-27 LAB — CBC
HEMATOCRIT: 28.1 % — AB (ref 36.0–46.0)
Hemoglobin: 9.4 g/dL — ABNORMAL LOW (ref 12.0–15.0)
MCH: 31 pg (ref 26.0–34.0)
MCHC: 33.5 g/dL (ref 30.0–36.0)
MCV: 92.7 fL (ref 78.0–100.0)
Platelets: 243 10*3/uL (ref 150–400)
RBC: 3.03 MIL/uL — ABNORMAL LOW (ref 3.87–5.11)
RDW: 22.3 % — ABNORMAL HIGH (ref 11.5–15.5)
WBC: 5.4 10*3/uL (ref 4.0–10.5)

## 2013-01-27 LAB — GLUCOSE, CAPILLARY
GLUCOSE-CAPILLARY: 140 mg/dL — AB (ref 70–99)
Glucose-Capillary: 111 mg/dL — ABNORMAL HIGH (ref 70–99)

## 2013-01-27 NOTE — Discharge Summary (Addendum)
Physician Discharge Summary  GENEIEVE DUELL ZOX:[id number] DOB: 11/27/1945 DOA: 01/24/2013  PCP: Scarlette Calico, MD  Admit date: 01/24/2013 Discharge date: 01/27/2013  Time spent: 40 minutes  Recommendations for Outpatient Follow-up:  1. Recommend palliative care following patient @ nursing facility 2. Pacemaker to be turned off prior to discharge from hospital 3. Consider rechecking labs periodically-would not check every day recommend discussion about feeding tubes to continue @ nursing facility-she likely has a poor prognosis and I would not aggressively push for feeding tubes unless family really wishes 4. Recommend at least every other day weights and adjust her Lasix dose 40 mg twice a day upwards or downwards based on this- 5. She has an indwelling Foley that was placed for comfort and also the fact that she also had urinary retention-urology has recommended voiding trials which will be trialed -she may need chronic Foley catheter stay 6. Do not control her blood sugar aggressively or her blood sugar-she came in with a lactic acidosis secondary to metformin 7.  Please do not use any antipsychotics or antidepressants in combination with gabapentin-she was admitted with toxic metabolic encephalopathy that was iatrogenic   Discharge Diagnoses:  Active Problems:   CORONARY ARTERY DISEASE   Moderate COPD (chronic obstructive pulmonary disease)   OBSTRUCTIVE SLEEP APNEA   Chronic systolic CHF (congestive heart failure), NYHA class 3   CHF (congestive heart failure)   Acute on chronic systolic CHF (congestive heart failure)   Lactic acidosis   Acute encephalopathy   Sepsis   Pacemaker   Cardiorenal syndrome with renal failure   Protein-calorie malnutrition, severe   Discharge Condition: Guarded poor prognosis likely  Diet recommendation: Liberalize the diet please  Filed Weights   01/25/13 0436 01/26/13 0422 01/27/13 0656  Weight: 74.4 kg (164 lb 0.4 oz) 73.4 kg (161 lb 13.1 oz)  72.485 kg (159 lb 12.8 oz)    History of present illness:   68 year old ? admitted 1/23 with toxic metabolic encephalopathy in the setting of acute renal insufficiency. Initially thought to be septic with lactic acidosis 3.6, however on metformin. Noted polypharmacy inclusive of multiple SSRIs and other medications. Recently placed pacemaker 12/16/12 and came off of hospice because of that.  complex cardiac h/o with CAD s/p STEMI 2002+RCA stentx2, CShF class 3-4 Ef 20% 12/31/12 s/p BIV PPM for 2:1 block 12/16/12 Hospitalization complicated by Aspiration/Urinary retention.  Mentation gradually improved during hospital stay but she has some underlying mild to moderate dementia and still cannot orient to place or person  Palliative care consult requested 1/24 for goals of care given multiorgan illness It was discussed with husband that patient has poor likely prognosis-he is still interested in discussion of use of feeding tubes. This can be delineated as an outpatient   Hospital Course:   1. Toxic metabolic encephalopathy secondary to polypharmacy-have discontinued multiple medications. We'll slowly reimplement necessary SSRIs. Blood culture, urine culture, respiratory virus panel all negative-this was not an infectious etiology and she will need to have very careful monitoring of her medications as an outpatient  2. Acute hypoxic resp failure-potentially related to volume overload. See below discussion. 3. Acute on chronic systolic/diastolic heart failure NYHA class III.+Nonischemic cardiomyopathy, last EF 12/31/12 = 20%-cardiology input appreciated. ontinue Imdur 60 every 12 hourly, Coreg 6.25 twice a day, aspirin 81 daily--patient may need diuresis to prevent #2-Lasix was held 1/24 given intravascular volume depletion. Echocardiogram demonstrated EF of 10%, severe pulmonary arterial hypertension, moderate mitral regurgitation and severe diffuse hypokinesis with LVEF of 10% on  this alone her  prognosis is guarded. 4. Pulmonary hypertension-severe-continue nitrates/hydralazine-no role for aggressive diuresis, other modalities 5. Chronic stage III-iV COPD-usually on home oxygen. Appreciate pulmonary input, continue tiotropium 18 mcg daily, albuterol 2.5 twice a day when necessary 6. Type 2 diabetes mellitus-completely discontinue oral agents. Lactic acidosis secondary to metformin. Please do not cover her blood sugar aggressively in the outpatient setting. Would cover with sliding scale insulin only if at sugar above 250 7. Cardiorenal syndrome-see below discussion 13 8. Obstructive sleep apnea-patient supposed to be on CPAP? 9. Coronary artery disease-as above. In addition continue aspirin 81 mg daily. ACE inhibitor at this stage relatively contraindicated given creatinine 1.9. 10. Hypertension-used to be on losartan 50 daily. See above discussion. Increase hydralazine to 50 mg every 8 hourly 11. Acute kidney injury-baseline creatinine 1.2. Appreciate nephrology input-minimizing nephrotoxins as possible 12. Urinary retention-keep Foley for comfort, realizing risk for iatrogenic infection 13. Anxiety-restart Effexor 150 every morning. Hold other potenti oric agents for now.ally psychotropic 14. Elevated INR-unclear etiology. Could be related to venous stasis from congestive heart failure, could also be related to non-alcoholic steatohepatitis. AST/ALT indicate this. Hold on ultrasound abdomen unless INR above 1.8 15. 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 Goals of care were delineated with the help of healthcare. She is DO NOT RESUSCITATE. Further delineation of goals including tube feeds etc. to be delineated as an outpatient at nursing facility  Consultants:  Cardiology  Critical care  Nephrology  Palliative care Procedures:  Chest x-ray 1/23  Chest x-ray 1/24 Antibiotics:  Vancomycin 1/23--> stop  Ceftriaxone 1/23--> stop   Discharge Exam: Filed  Vitals:   01/27/13 0622  BP: 131/84  Pulse: 94  Temp: 97.9 F (36.6 C)  Resp: 18   Alert, oriented only x1 Now she is in the hospital Cannot tell me the president cannot come in today cannot candidate cannot tell me the year General: EOMI, NCAT Cardiovascular: JVD about 10-11 cm right side, no bruit, S1-S2 no murmur rub or gallop, slightly tachycardic Respiratory: Clinically clear Lower extremities have no edema  Discharge Instructions  Discharge Orders   Future Appointments Provider Department Dept Phone   02/14/2013 12:00 PM Star Age, MD [facility] [phone]   03/18/2013 2:45 PM Deboraha Sprang, MD [place] [facility] [phone]   Future Orders Complete By Expires   Diet - low sodium heart healthy  As directed    Increase activity slowly  As directed        Medication List    STOP taking these medications       diclofenac sodium 1 % Gel  Commonly known as:  VOLTAREN     gabapentin 300 MG capsule  Commonly known as:  NEURONTIN     hydrOXYzine 10 MG tablet  Commonly known as:  ATARAX/VISTARIL     losartan 50 MG tablet  Commonly known as:  COZAAR     SIMPLE DIAGNOSTICS LANCING DEV Misc     sitaGLIPtin-metformin 50-500 MG per tablet  Commonly known as:  JANUMET     spironolactone 25 MG tablet  Commonly known as:  ALDACTONE     traZODone 100 MG tablet  Commonly known as:  DESYREL      TAKE these medications       acetaminophen 500 MG tablet  Commonly known as:  TYLENOL  Take 500 mg by mouth every 6 (six) hours as needed for pain.     albuterol (2.5 MG/3ML) 0.083% nebulizer solution  Commonly  known as:  PROVENTIL  Take 2.5 mg by nebulization every 6 (six) hours as needed for wheezing.     aspirin EC 81 MG tablet  Take 81 mg by mouth daily.     atorvastatin 40 MG tablet  Commonly known as:  LIPITOR  Take 40 mg by mouth every morning.     carvedilol 12.5 MG tablet  Commonly known as:  COREG  Take 0.5 tablets  (6.25 mg total) by mouth 2 (two) times daily with a meal.     feeding supplement (GLUCERNA SHAKE) Liqd  Take 237 mLs by mouth daily as needed (for nutrition).     FERRAPLUS 90 90-1 MG Tabs  Take 1 tablet by mouth daily.     furosemide 80 MG tablet  Commonly known as:  LASIX  Take 1 tablet (80 mg total) by mouth 2 (two) times daily.     isosorbide mononitrate 60 MG 24 hr tablet  Commonly known as:  IMDUR  Take 60 mg by mouth daily at 12 noon.     magnesium oxide 400 MG tablet  Commonly known as:  MAG-OX  Take 1 tablet (400 mg total) by mouth daily.     nitroGLYCERIN 0.4 MG SL tablet  Commonly known as:  NITROSTAT  Place 0.4 mg under the tongue every 5 (five) minutes as needed for chest pain.     omeprazole 40 MG capsule  Commonly known as:  PRILOSEC  Take 40 mg by mouth daily.     polyethylene glycol packet  Commonly known as:  MIRALAX / GLYCOLAX  Take 17 g by mouth daily as needed (for constipation).     tiotropium 18 MCG inhalation capsule  Commonly known as:  SPIRIVA  Place 18 mcg into inhaler and inhale daily.     traMADol 50 MG tablet  Commonly known as:  ULTRAM  Take 50-100 mg by mouth every 6 (six) hours as needed for moderate pain.     venlafaxine XR 150 MG 24 hr capsule  Commonly known as:  EFFEXOR-XR  Take 1 capsule (150 mg total) by mouth every morning.       Allergies  Allergen Reactions  . Amlodipine Other (See Comments)    constipation  . Ramipril Cough  . Adhesive [Tape] Hives    "peels skin off"  . Morphine And Related Other (See Comments)    Per Husband, Pt has hallucinations  . Penicillins Itching  . Codeine Rash       Follow-up Information   Call Scarlette Calico, MD.   Specialty:  Internal Medicine   Contact information:   [address], [address]ST FLO[address] [place] [address] [phone]        The results of significant diagnostics from this hospitalization (including imaging, microbiology, ancillary and laboratory)  are listed below for reference.    Significant Diagnostic Studies: Dg Chest 1 View  01/24/2013   CLINICAL DATA:  Pain.  Fall and unresponsive.  EXAM: CHEST - 1 VIEW  COMPARISON:  12/31/2012  FINDINGS: 3 lead left-sided pacemaker is unchanged in appearance. Marked enlargement of the cardiac silhouette is unchanged. There is mild pulmonary vascular congestion, increased from prior, with mild perihilar edema. There is a small to moderate right pleural effusion, increased from prior. No acute osseous abnormality is identified.  IMPRESSION: Cardiomegaly with increased pulmonary vascular congestion and mild perihilar edema. Increased, small to moderate right pleural effusion.   Electronically Signed   By: Logan Bores   On:  01/24/2013 13:58   Dg Chest 2 View  01/26/2013   CLINICAL DATA:  Shortness of breath.  Congestive heart failure.  EXAM: CHEST  2 VIEW  COMPARISON:  01/25/2013  FINDINGS: Moderate cardiomegaly and diffuse pulmonary edema pattern show no significant change. Small bilateral pleural effusions noted. Pacemaker remains in appropriate position.  IMPRESSION: No significant change in cardiomegaly, diffuse pulmonary edema, and small bilateral pleural effusions.   Electronically Signed   By: Earle Gell M.D.   On: 01/26/2013 13:33   Dg Hip Complete Right  01/24/2013   CLINICAL DATA:  Fall.  Unresponsive.  EXAM: RIGHT HIP - COMPLETE 2+ VIEW  COMPARISON:  04/10/2011  FINDINGS: Mild sclerosis about the right greater than left sacroiliac joints is unchanged. There is no evidence of acute fracture. Both hips demonstrate mild joint space narrowing and marginal osteophytosis, with a prominent subchondral cyst present in the left femoral head. There is no evidence of dislocation. Soft tissues are unremarkable.  IMPRESSION: 1. No evidence of acute fracture or dislocation. 2. Left greater than right hip osteoarthrosis. 3. Unchanged, mild sclerosis of the right greater than left SI joints.   Electronically Signed    By: Logan Bores   On: 01/24/2013 14:10   Ct Head Wo Contrast  01/24/2013   CLINICAL DATA:  Altered mental status.  EXAM: CT HEAD WITHOUT CONTRAST  TECHNIQUE: Contiguous axial images were obtained from the base of the skull through the vertex without intravenous contrast.  COMPARISON:  Head CT scan 11/19/2012 and brain MRI 05/20/2012.  FINDINGS: Patchy and confluent hypoattenuation in the subcortical and periventricular deep white matter is compatible with chronic microvascular ischemic change. There is no evidence of acute intracranial abnormality including infarction, hemorrhage, mass lesion, mass effect, midline shift or abnormal extra-axial fluid collection. No hydrocephalus or pneumocephalus. Tiny locules of gas in the right pterygoid loss are likely related to venous access. Atherosclerosis is noted.  IMPRESSION: No acute abnormality.  Extensive chronic microvascular ischemic change.   Electronically Signed   By: Inge Rise M.D.   On: 01/24/2013 13:47   US Renal  01/03/2013   CLINICAL DATA:  Chronic renal failure  EXAM: RENAL/URINARY TRACT ULTRASOUND COMPLETE  COMPARISON:  None.  FINDINGS: Right Kidney:  Length: 9.5 cm in length. Slight increased echogenicity is noted. No mass lesion or hydronephrosis is seen.  Left Kidney:  Length: 9.9 cm in length. Increased echogenicity is noted. No mass lesion or hydronephrosis is seen.  Bladder:  Decompressed by Foley catheter.  Bilateral pleural effusions are noted.  IMPRESSION: Bilateral pleural effusions.  Increased echogenicity consistent with medical renal disease.   Electronically Signed   By: Inez Catalina M.D.   On: 01/03/2013 15:20   Dg Chest Port 1 View  01/25/2013   CLINICAL DATA:  Pulmonary edema.  Right pleural effusion.  EXAM: PORTABLE CHEST - 1 VIEW  COMPARISON:  01/24/2013 and 12/31/2012  FINDINGS: Pulmonary edema and pulmonary vascular congestion have increased bilaterally. Stable chronic blunting of the right costophrenic angle. Chronic  cardiomegaly. AICD in place. No acute osseous abnormality. There is an old nonunion fracture of the proximal left humerus.  IMPRESSION: Increasing bilateral pulmonary edema and pulmonary vascular congestion.   Electronically Signed   By: Rozetta Nunnery M.D.   On: 01/25/2013 08:26   Dg Chest Port 1 View  12/31/2012   CLINICAL DATA:  Lower left chest and abdominal pain.  EXAM: PORTABLE CHEST - 1 VIEW  COMPARISON:  Chest radiograph December 18, 2012  FINDINGS: The  cardiac silhouette appears at moderate to severely enlarged, similar. Mildly calcified aortic knob. Triple lead left cardiac defibrillator in situ. Mild chronic interstitial changes, decreased right costophrenic angle effusion. No focal consolidations. Minimal residual central pulmonary vasculature congestion. No pneumothorax.  Subacute appearing left humeral head fracture.  IMPRESSION: Stable cardiomegaly, at decreased pulmonary edema with minimal residual central pulmonary vasculature congestion. Mild chronic interstitial changes with small residual right lung base pleural effusion .   Electronically Signed   By: Elon Alas   On: 12/31/2012 06:46    Microbiology: Recent Results (from the past 240 hour(s))  URINE CULTURE     Status: None   Collection Time    01/24/13 12:30 PM      Result Value Range Status   Specimen Description URINE, RANDOM   Final   Special Requests Immunocompromised   Final   Culture  Setup Time     Final   Value: 01/24/2013 16:58     Performed at [facility]     Final   Value: NO GROWTH     Performed at [facility]   Culture     Final   Value: NO GROWTH     Performed at [facility]   Report Status 01/25/2013 FINAL   Final  CULTURE, BLOOD (ROUTINE X 2)     Status: None   Collection Time    01/24/13  4:40 PM      Result Value Range Status   Specimen Description BLOOD HAND LEFT   Final   Special Requests BOTTLES DRAWN AEROBIC ONLY [facility]   Final   Culture  Setup  Time     Final   Value: 01/24/2013 20:24     Performed at [facility]   Culture     Final   Value:        BLOOD CULTURE RECEIVED NO GROWTH TO DATE CULTURE WILL BE HELD FOR 5 DAYS BEFORE ISSUING A FINAL NEGATIVE REPORT     Performed at [facility]   Report Status PENDING   Incomplete  CULTURE, BLOOD (ROUTINE X 2)     Status: None   Collection Time    01/24/13  4:45 PM      Result Value Range Status   Specimen Description BLOOD ARM LEFT   Final   Special Requests BOTTLES DRAWN AEROBIC ONLY [facility]   Final   Culture  Setup Time     Final   Value: 01/24/2013 20:24     Performed at [facility]   Culture     Final   Value:        BLOOD CULTURE RECEIVED NO GROWTH TO DATE CULTURE WILL BE HELD FOR 5 DAYS BEFORE ISSUING A FINAL NEGATIVE REPORT     Performed at [facility]   Report Status PENDING   Incomplete  RESPIRATORY VIRUS PANEL     Status: None   Collection Time    01/24/13  6:00 PM      Result Value Range Status   Source - RVPAN NASAL SWAB   Corrected   Comment: CORRECTED ON 01/25 AT 2831: PREVIOUSLY REPORTED AS NASAL SWAB   Respiratory Syncytial Virus A NOT DETECTED   Final   Respiratory Syncytial Virus B NOT DETECTED   Final   Influenza A NOT DETECTED   Final   Influenza B NOT DETECTED   Final   Parainfluenza 1 NOT DETECTED   Final   Parainfluenza 2  NOT DETECTED   Final   Parainfluenza 3 NOT DETECTED   Final   Metapneumovirus NOT DETECTED   Final   Rhinovirus NOT DETECTED   Final   Adenovirus NOT DETECTED   Final   Influenza A H1 NOT DETECTED   Final   Influenza A H3 NOT DETECTED   Final   Comment: (NOTE)           Normal Reference Range for each Analyte: NOT DETECTED     Testing performed using the Luminex xTAG Respiratory Viral Panel test     kit.     This test was developed and its performance characteristics determined     by [facility]. It has not been cleared or approved by the [place]     Food and Drug Administration. This test is  used for clinical purposes.     It should not be regarded as investigational or for research. This     laboratory is certified under the [place] (CLIA) as qualified to perform high complexity     clinical laboratory testing.     Performed at [place] PCR SCREENING     Status: None   Collection Time    01/24/13  6:40 PM      Result Value Range Status   MRSA by PCR NEGATIVE  NEGATIVE Final   Comment:            The GeneXpert MRSA Assay (FDA     approved for NASAL specimens     only), is one component of a     comprehensive MRSA colonization     surveillance program. It is not     intended to diagnose MRSA     infection nor to guide or     monitor treatment for     MRSA infections.     Labs: Basic Metabolic Panel:  Recent Labs Lab 01/24/13 1249 01/24/13 1315 01/24/13 2153 01/25/13 0320 01/27/13 0409  NA 132* 127* 130* 132* 137  K 5.9* 5.5* 5.3 4.7 3.8  CL 90* 92* 90* 92* 93*  CO2 21  --  _0 GLUCOSE 99 99 88 58* 136*  BUN 41* 38* 41* 40* 28*  CREATININE 2.00* 2.10* 1.94* 1.91* 1.31*  CALCIUM 9.0  --  8.7 8.7 9.0  PHOS  --   --   --   --  2.8   Liver Function Tests:  Recent Labs Lab 01/24/13 1249 01/25/13 0320 01/26/13 0351 01/27/13 0409  AST 36 43* 50*  --   ALT 23 25 32  --   ALKPHOS 103 95 97  --   BILITOT 0.7 0.5 0.4  --   PROT 6.5 5.9* 5.9*  --   ALBUMIN 2.8* 2.6* 2.5* 2.7*   No results found for this basename: LIPASE, AMYLASE,  in the last 168 hours  Recent Labs Lab 01/24/13 1249 01/24/13 1636  AMMONIA 30 17   CBC:  Recent Labs Lab 01/24/13 1213 01/24/13 1315 01/24/13 2153 01/25/13 0320 01/26/13 0351 01/27/13 0409  WBC 4.9  --  5.4 4.8 5.0 5.4  NEUTROABS 4.2  --   --   --   --   --   HGB 9.2* 10.5* 9.5* 9.0* 8.6* 9.4*  HCT 27.2* 31.0* 28.0* 26.8* 25.2* 28.1*  MCV 91.0  --  90.6 90.5 91.0 92.7  PLT 260  --  261 265 232 243   Cardiac Enzymes:  No results found for  this basename: CKTOTAL, CKMB, CKMBINDEX, TROPONINI,  in the last 168 hours BNP: BNP (last 3 results)  Recent Labs  11/29/12 2100 12/31/12 0652 01/24/13 1213  PROBNP 8012.0* 29201.0* 62827.0*   CBG:  Recent Labs Lab 01/26/13 0821 01/26/13 1152 01/26/13 1643 01/26/13 2145 01/27/13 0624  GLUCAP 113* 162* 176* 144* 140*       Signed:  Laney Louderback, JAI-GURMUKH  [facility] 01/27/2013, 8:07 AM

## 2013-01-27 NOTE — Clinical Social Work Psychosocial (Signed)
     Clinical Social Work Department BRIEF PSYCHOSOCIAL ASSESSMENT 01/27/2013  Patient:  [name],[name]     Account Number:  [id number]     Admit date:  01/24/2013  Clinical Social Worker:  Tiburcio PeaROWDER,Destynie Toomey, LCSWA  Date/Time:  01/27/2013 11:43 AM  Referred by:  Physician  Date Referred:  01/27/2013 Referred for  SNF Placement   Other Referral:   Interview type:  Family Other interview type:   Husband   [place]    PSYCHOSOCIAL DATA Living Status:  HUSBAND Admitted from facility:   Level of care:   Primary support name:  Kurtis [place]  [phone] Primary support relationship to patient:  SPOUSE Degree of support available:   Very supportive    CURRENT CONCERNS Current Concerns  Post-Acute Placement  Other - See comment   Other Concerns:   [facility]    SOCIAL WORK ASSESSMENT / PLAN 68 year old female admitted from home where she is currently living with her husband [name]. [facility] but soon signed out AMA due to worries about her medical expenses. Per MD- patient is quite confused and is not able to make safe decisions for herself.  CSW spoke with her husband [name] who agrees that she needs further SNF care and wants her to return to SNF. Fl2 completed and sent to area facilities for review.  Patient is medically stable for d/c per MD.   Assessment/plan status:  Psychosocial Support/Ongoing Assessment of Needs Other assessment/ plan:   Information/referral to community resources:   SNF list placed in patient's room    PATIENTS/FAMILYS RESPONSE TO PLAN OF CARE: Patient is alert but disoriented to place and time. She is aware of person only and is currently quite pleasant.  She is not a good historian or able to adequately participate in her plan of care.  Her husband supports placement of patient as he does not feel he can manage her care at home. CSW will assist with d/c.  Lorri Frederickonna T. Orel Hord,  LCSWA  [phone]

## 2013-01-27 NOTE — Progress Notes (Addendum)
     SUBJECTIVE: no complaints.   BP 131/84  Pulse 94  Temp(Src) 97.9 F (36.6 C) (Oral)  Resp 18  Ht 5\' 9"  (1.753 m)  Wt 159 lb 12.8 oz (72.485 kg)  BMI 23.59 kg/m2  SpO2 98%  Intake/Output Summary (Last 24 hours) at 01/27/13 16100925 Last data filed at 01/27/13 [id number]  Gross per 24 hour  Intake    420 ml  Output   1350 ml  Net   -930 ml    PHYSICAL EXAM General: Well developed, well nourished, in no acute distress. Alert and oriented x 3.  Psych:  Good affect, responds appropriately Neck: No JVD. No masses noted.  Lungs: Clear bilaterally with no wheezes or rhonci noted.  Heart: RRR with no murmurs noted. Abdomen: Bowel sounds are present. Soft, non-tender.  Extremities: No lower extremity edema.   LABS: Basic Metabolic Panel:  Recent Labs  54/09/8099/24/15 0320 01/27/13 0409  NA 132* 137  K 4.7 3.8  CL 92* 93*  CO2 23 25  GLUCOSE 58* 136*  BUN 40* 28*  CREATININE 1.91* 1.31*  CALCIUM 8.7 9.0  PHOS  --  2.8   CBC:  Recent Labs  01/24/13 1213  01/26/13 0351 01/27/13 0409  WBC 4.9  < > 5.0 5.4  NEUTROABS 4.2  --   --   --   HGB 9.2*  < > 8.6* 9.4*  HCT 27.2*  < > 25.2* 28.1*  MCV 91.0  < > 91.0 92.7  PLT 260  < > 232 243  < > = values in this interval not displayed.  Current Meds: . albuterol  2.5 mg Nebulization BID  . aspirin EC  81 mg Oral Daily  . carvedilol  6.25 mg Oral BID WC  . feeding supplement (GLUCERNA SHAKE)  237 mL Oral TID BM  . furosemide  40 mg Oral BID  . heparin  5,000 Units Subcutaneous Q8H  . hydrALAZINE  50 mg Oral Q8H  . insulin aspart  0-9 Units Subcutaneous TID WC  . isosorbide mononitrate  60 mg Oral Q1200  . sodium chloride  3 mL Intravenous Q12H  . tiotropium  18 mcg Inhalation Daily  . venlafaxine XR  150 mg Oral q morning - 10a   Active Problems:   CORONARY ARTERY DISEASE   Moderate COPD (chronic obstructive pulmonary disease)   OBSTRUCTIVE SLEEP APNEA   Chronic systolic CHF (congestive heart failure), NYHA class 3  CHF (congestive heart failure)   Acute on chronic systolic CHF (congestive heart failure)   Lactic acidosis   Acute encephalopathy   Sepsis   Pacemaker   Cardiorenal syndrome with renal failure   Protein-calorie malnutrition, severe   ASSESSMENT AND PLAN:  1. Acute on chronic systolic CHF: Pt readmitted with failure to thrive and volume overload. See full consult note by Dr. [place]. Pt and family have decided on Hospice/Palliative care. As discussion with primary team, they would like the ICD to be turned off.  Will contact [employer] rep this am.   2. Encephalopathy: Resolved  3. Dementia  She is being discharged today with plans for Hospice. Will turn off ICD per pt/family requests. Will alert Dr. Graciela HusbandsKlein and the Pacemaker clinic.   MCALHANY,CHRISTOPHER  1/26/20159:25 AM

## 2013-01-27 NOTE — Clinical Social Work Placement (Addendum)
     Clinical Social Work Department CLINICAL SOCIAL WORK PLACEMENT NOTE 01/27/2013  Patient:  [name],[name]  Account Number:  [id number] Admit date:  01/24/2013  Clinical Social Worker:  Lupita LeashNNA Jordane Hisle, LCSWA  Date/time:  01/27/2013 11:50 AM  Clinical Social Work is seeking post-discharge placement for this patient at the following level of care:   SKILLED NURSING   (*CSW will update this form in Epic as items are completed)   01/27/2013  [facility]/family provided with [facility] of Clinical Social Works list of facilities offering this level of care within the geographic area requested by the patient (or if unable, by the patients family).  01/27/2013  Patient/family informed of their freedom to choose among providers that offer the needed level of care, that participate in Medicare, Medicaid or managed care program needed by the patient, have an available bed and are willing to accept the patient.  01/27/2013  Patient/family informed of [facility] ownership interest in [facility], as well as of the fact that they are under no obligation to receive care at this facility.  PASARR submitted to EDS on  PASARR number received from EDS on   FL2 transmitted to all facilities in geographic area requested by pt/family on  01/27/2013 FL2 transmitted to all facilities within larger geographic area on   Patient informed that his/her managed care company has contracts with or will negotiate with  certain facilities, including the following:   Has existing PASARR number     Patient/family informed of bed offers received:  01/27/2013 [facility] Physician recommends and patient chooses bed at    [facility]  01/27/2013 Patient to be transferred to facility by Ambulance  Sharin Mons([facility])  The following physician request were entered in Epic:   Additional Comments: 01/17/13  OK per MD  for d/c today to SNF- [facility] and her husband requested return to facility.  Nursing notified and called report.  No further CSW needs identified. CSW signing off.  Lorri Frederickonna T. Laurence Crofford, LCSWA [phone]

## 2013-01-28 NOTE — Progress Notes (Signed)
UR completed - Retro   Almeter Westhoff K. Pearson Picou, RN, BSN, MSHL, CCM  01/28/2013 12:33 PM

## 2013-01-30 LAB — CULTURE, BLOOD (ROUTINE X 2)
Culture: NO GROWTH
Culture: NO GROWTH

## 2013-02-03 ENCOUNTER — Telehealth: Payer: Self-pay | Admitting: *Deleted

## 2013-02-03 DIAGNOSIS — I5022 Chronic systolic (congestive) heart failure: Secondary | ICD-10-CM

## 2013-02-03 DIAGNOSIS — J449 Chronic obstructive pulmonary disease, unspecified: Secondary | ICD-10-CM

## 2013-02-03 DIAGNOSIS — F0390 Unspecified dementia without behavioral disturbance: Secondary | ICD-10-CM

## 2013-02-03 NOTE — Telephone Encounter (Signed)
Relative phoned requesting referral order for home health to resume since patient has been d/c'ed from hospital & is back at home. Requested [facility].  Please advise.  CB# [phone]

## 2013-02-06 ENCOUNTER — Encounter: Admitting: Internal Medicine

## 2013-02-07 DIAGNOSIS — I509 Heart failure, unspecified: Secondary | ICD-10-CM

## 2013-02-07 DIAGNOSIS — I2589 Other forms of chronic ischemic heart disease: Secondary | ICD-10-CM

## 2013-02-07 DIAGNOSIS — Z48812 Encounter for surgical aftercare following surgery on the circulatory system: Secondary | ICD-10-CM

## 2013-02-07 DIAGNOSIS — I5022 Chronic systolic (congestive) heart failure: Secondary | ICD-10-CM

## 2013-02-14 ENCOUNTER — Ambulatory Visit: Payer: Medicare Other | Admitting: Neurology

## 2013-03-02 DEATH — deceased

## 2013-03-18 ENCOUNTER — Encounter: Admitting: Internal Medicine

## 2013-08-26 ENCOUNTER — Other Ambulatory Visit: Payer: Self-pay | Admitting: *Deleted

## 2013-12-01 IMAGING — CR DG CHEST 2V
2 series · 2 of 2 positions shown · non-contrast
Comparison: Chest radiograph 09/26/2010

CLINICAL DATA: Hypertension

CHEST - 2 VIEW

[view not recorded (1 of 2)]
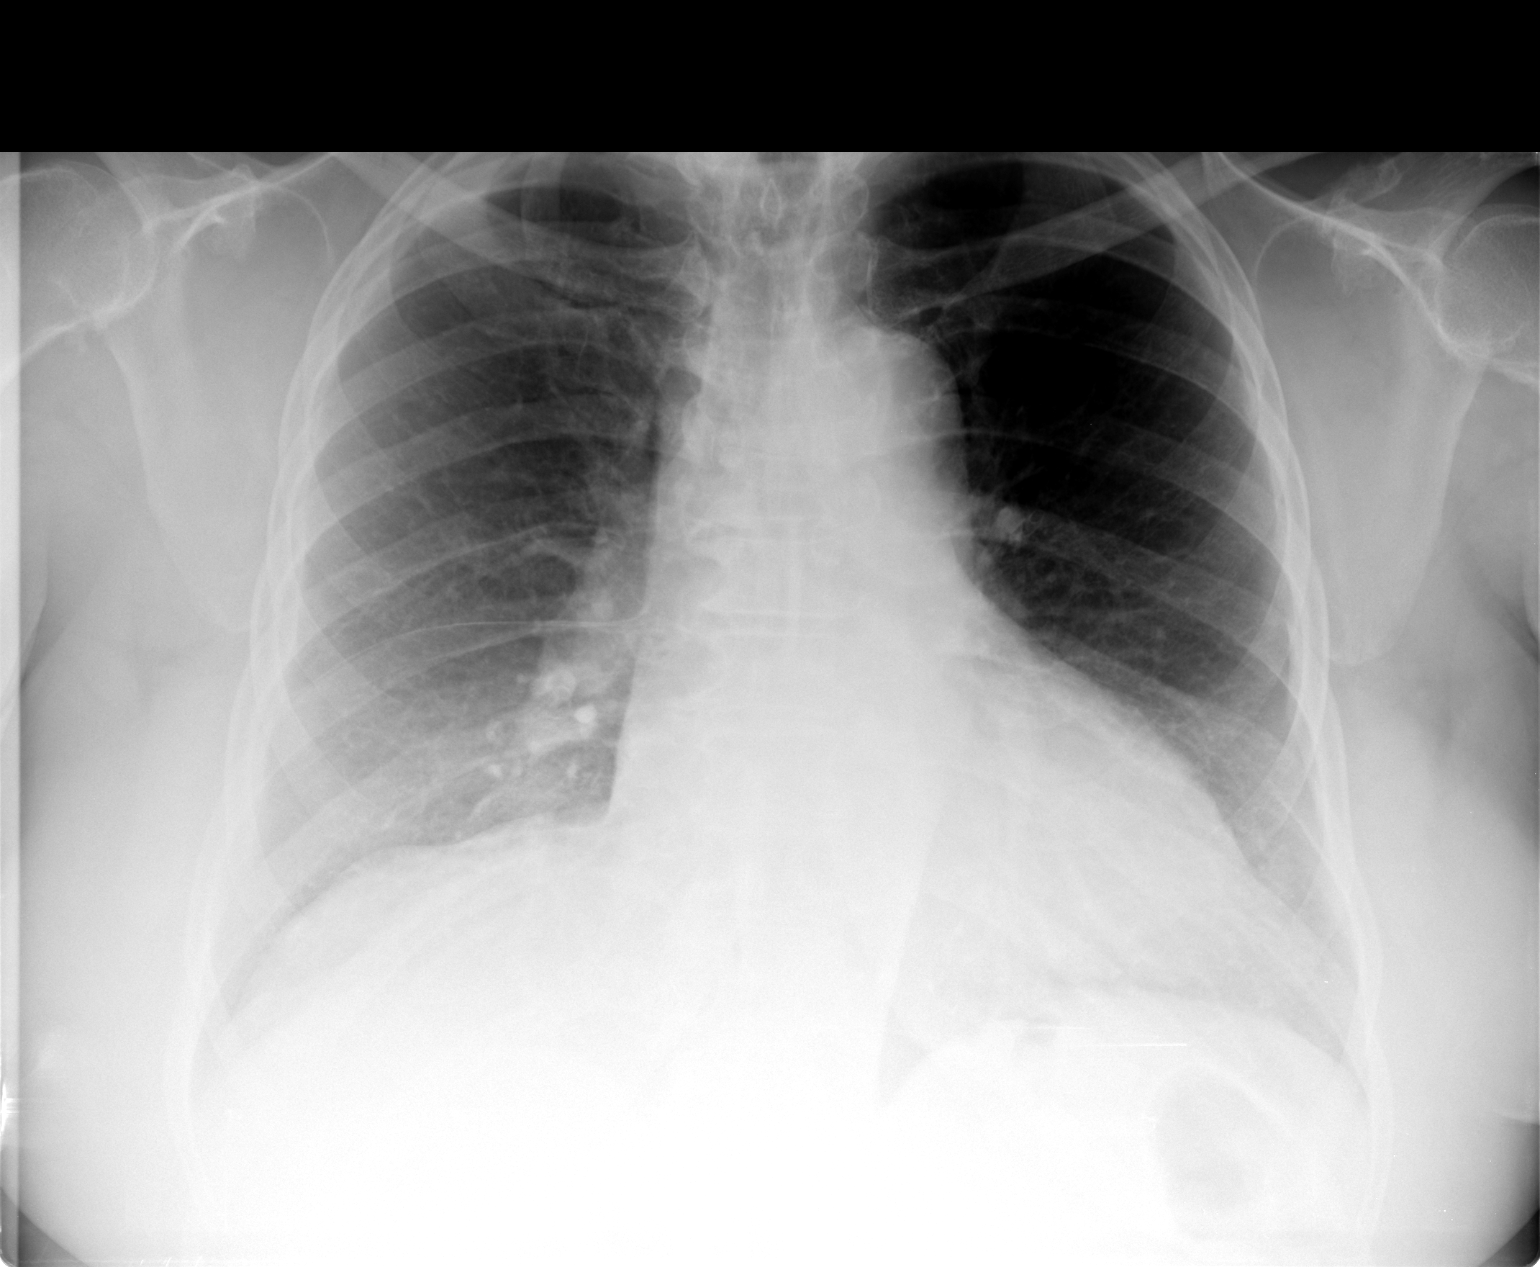

[view not recorded (2 of 2)]
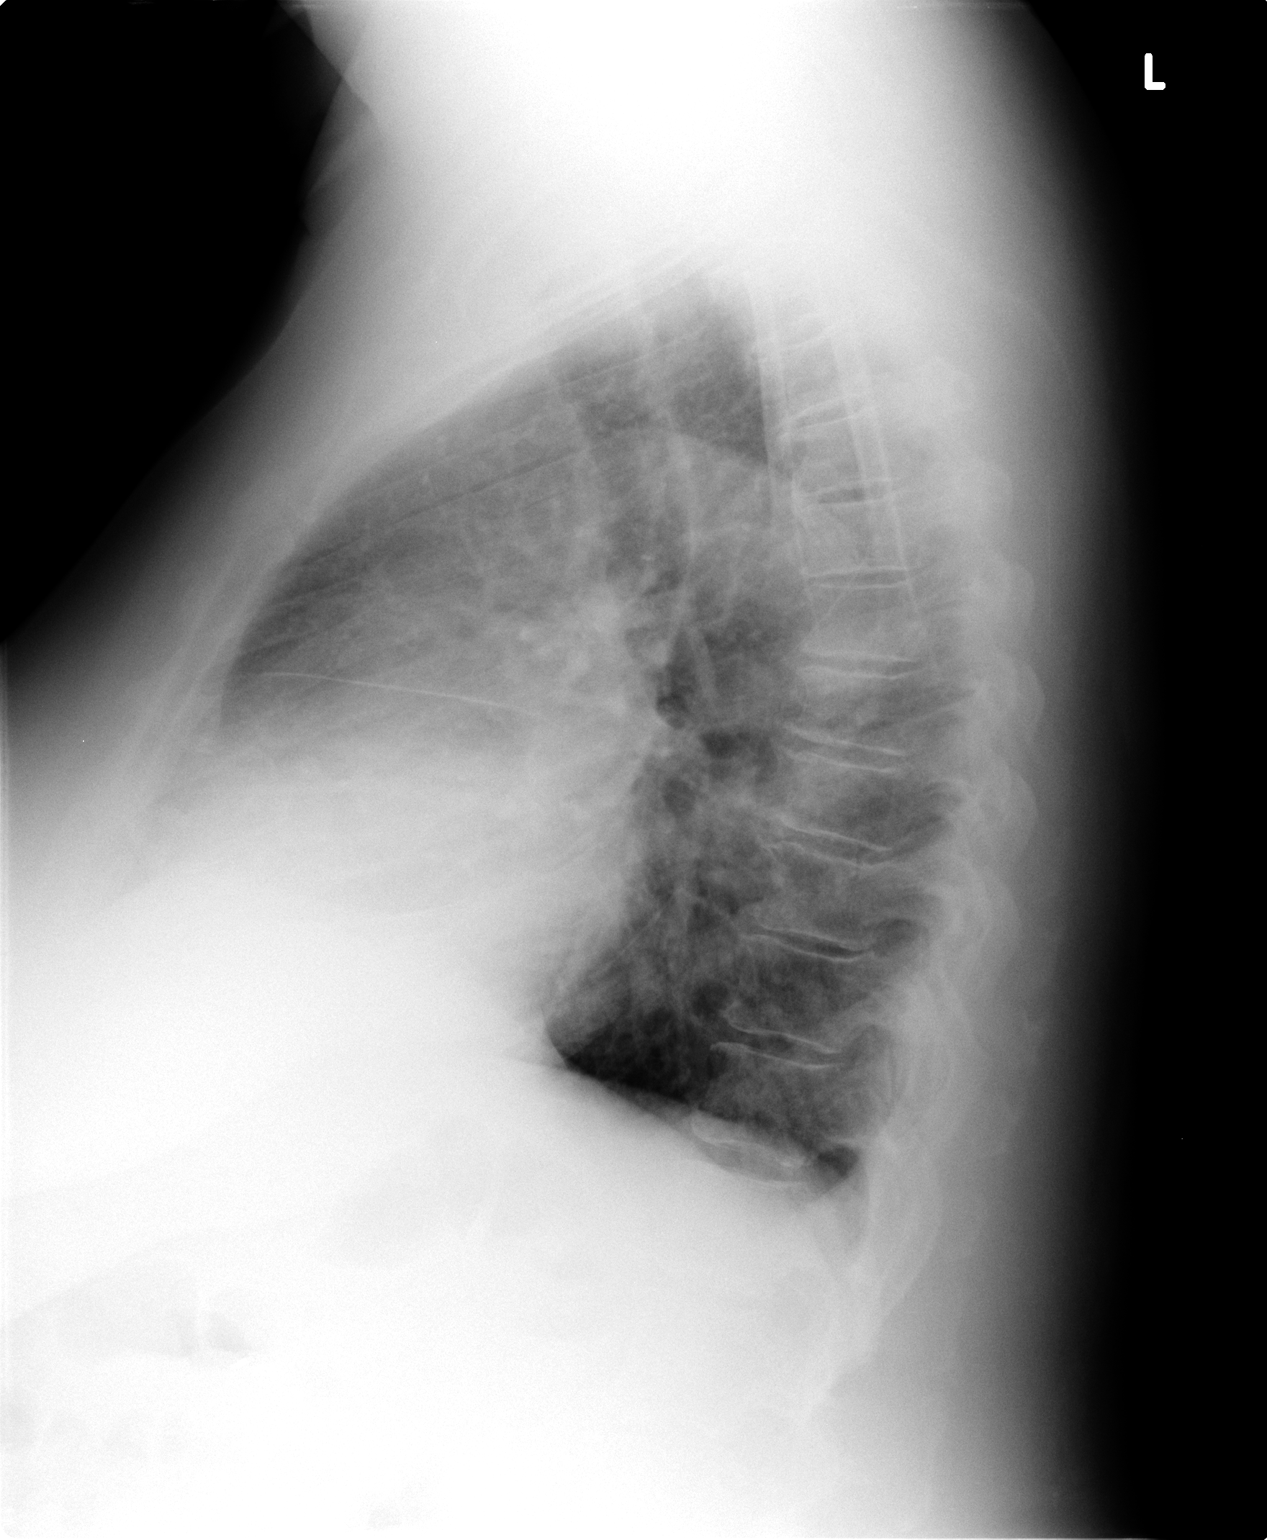

[2 of 2 positions shown; findings below may reference images not displayed]

FINDINGS: Normal mediastinum and heart silhouette.  Costophrenic
angles are clear.  No effusion, infiltrate, or pneumothorax.
Degenerative osteophytosis of the thoracic spine.
IMPRESSION: Cardiomegaly without acute cardiopulmonary process.

## 2013-12-11 ENCOUNTER — Encounter (HOSPITAL_COMMUNITY): Payer: Self-pay | Admitting: Cardiovascular Disease

## 2015-11-06 IMAGING — CR DG SHOULDER 2+V*L*
2 series · 2 of 2 positions shown · non-contrast
Comparison: None.

CLINICAL DATA: Left shoulder pain, loss of consciousness.

EXAM:
LEFT SHOULDER - 2+ VIEW

[t shoulder ap internal left]
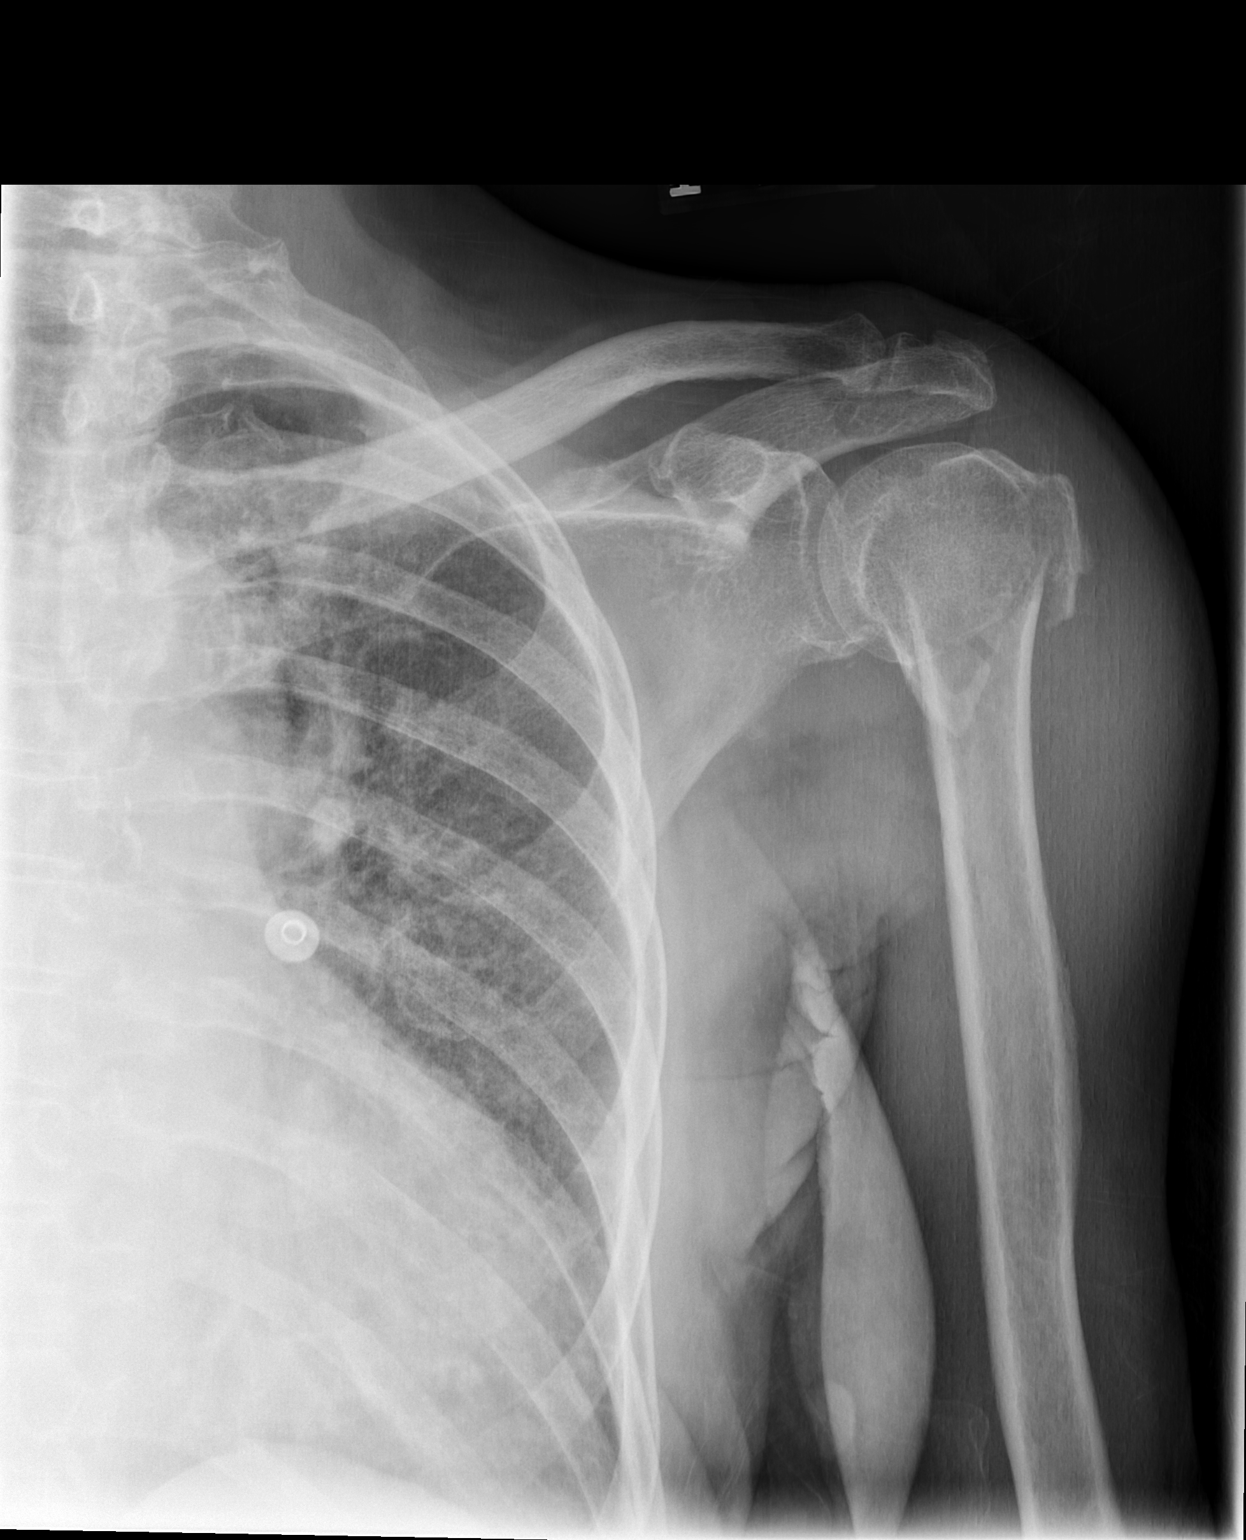

[t shoulder ap external left]
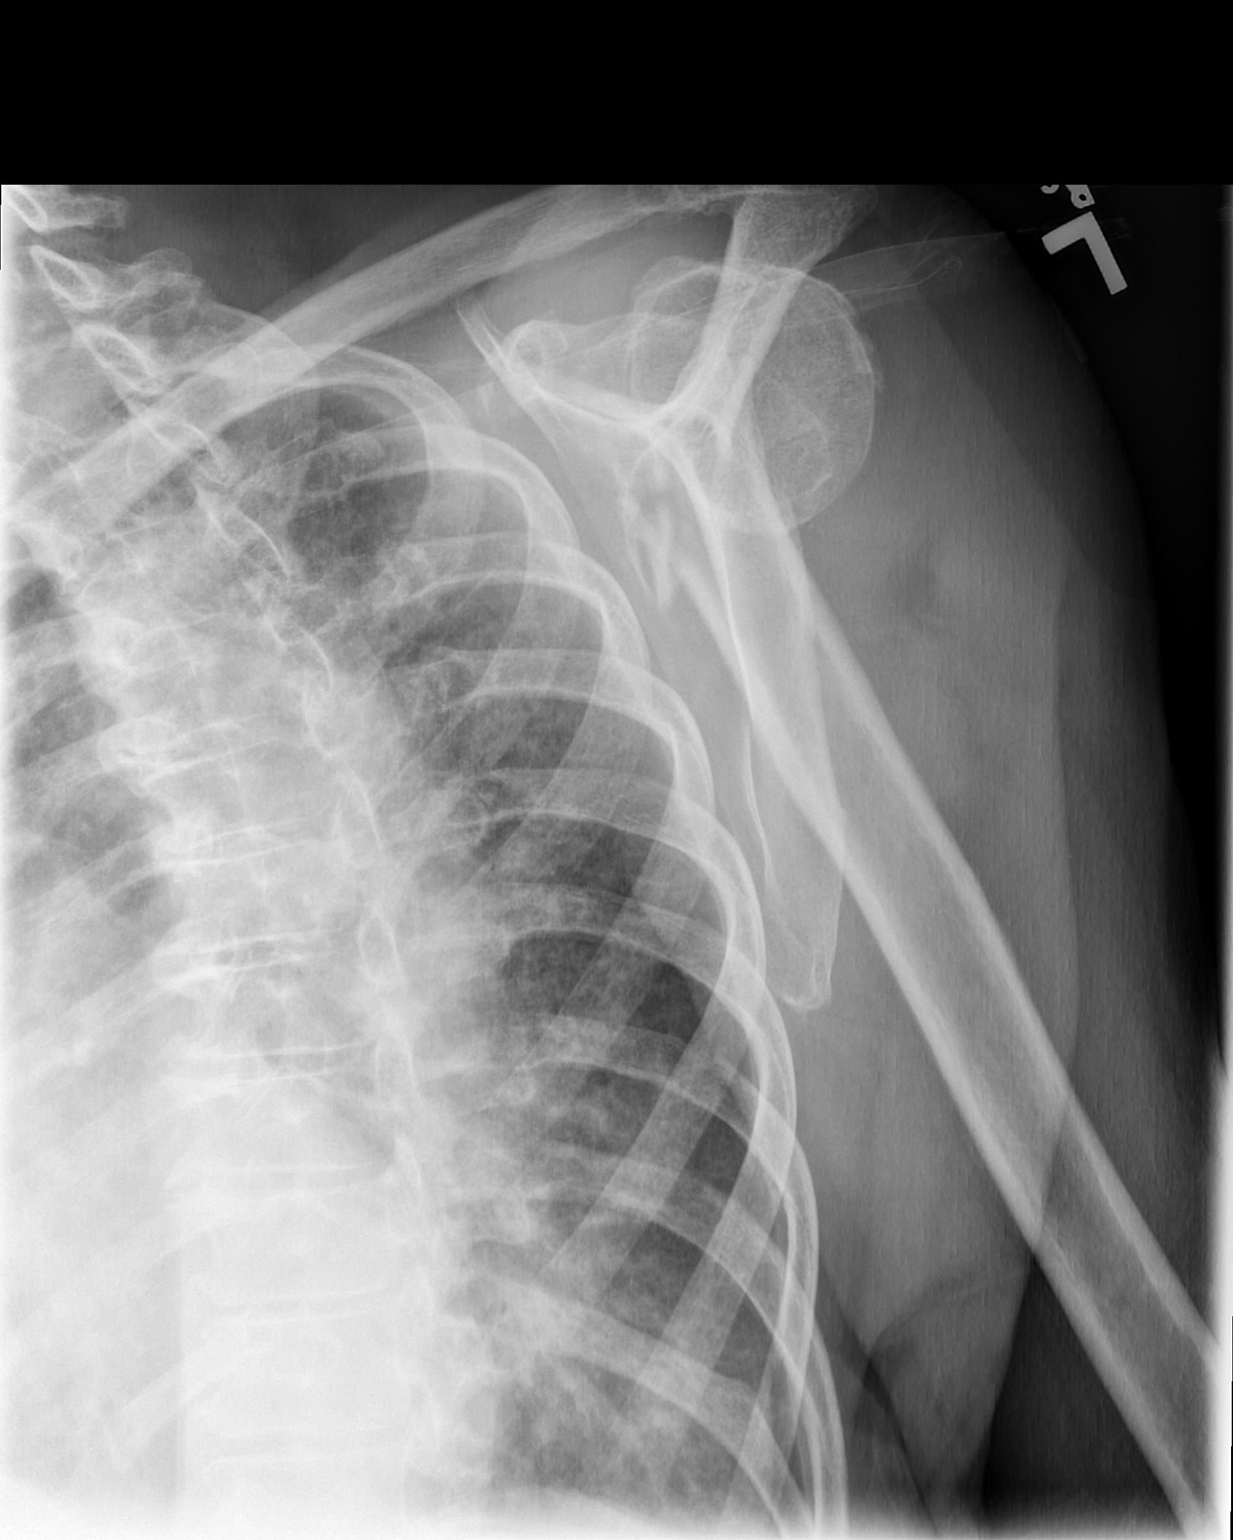

[2 of 2 positions shown; findings below may reference images not displayed]

FINDINGS: There is a comminuted fracture of the surgical neck of the left
proximal humerus which also involves the greater tuberosity. There
is apex anterior angulation. There is no glenohumeral dislocation.
There are mild degenerative changes of the acromioclavicular joint.
IMPRESSION: Comminuted fracture of the surgical neck of the left proximal
humerus.
